# Patient Record
Sex: Female | Born: 1945 | Race: Black or African American | Hispanic: No | Smoking: Former smoker
Health system: Southern US, Community
[De-identification: ages and names within clinical notes are randomized; demographics above are authoritative.]

## PROBLEM LIST (undated history)

## (undated) DIAGNOSIS — R011 Cardiac murmur, unspecified: Secondary | ICD-10-CM

## (undated) DIAGNOSIS — I4819 Other persistent atrial fibrillation: Secondary | ICD-10-CM

## (undated) DIAGNOSIS — K219 Gastro-esophageal reflux disease without esophagitis: Secondary | ICD-10-CM

## (undated) DIAGNOSIS — K224 Dyskinesia of esophagus: Secondary | ICD-10-CM

## (undated) DIAGNOSIS — T4145XA Adverse effect of unspecified anesthetic, initial encounter: Secondary | ICD-10-CM

## (undated) DIAGNOSIS — I495 Sick sinus syndrome: Secondary | ICD-10-CM

## (undated) DIAGNOSIS — I739 Peripheral vascular disease, unspecified: Secondary | ICD-10-CM

## (undated) DIAGNOSIS — I10 Essential (primary) hypertension: Secondary | ICD-10-CM

## (undated) DIAGNOSIS — E119 Type 2 diabetes mellitus without complications: Secondary | ICD-10-CM

## (undated) DIAGNOSIS — Z789 Other specified health status: Secondary | ICD-10-CM

## (undated) DIAGNOSIS — M1990 Unspecified osteoarthritis, unspecified site: Secondary | ICD-10-CM

## (undated) DIAGNOSIS — I2510 Atherosclerotic heart disease of native coronary artery without angina pectoris: Secondary | ICD-10-CM

## (undated) DIAGNOSIS — K760 Fatty (change of) liver, not elsewhere classified: Secondary | ICD-10-CM

## (undated) DIAGNOSIS — T8859XA Other complications of anesthesia, initial encounter: Secondary | ICD-10-CM

## (undated) DIAGNOSIS — N189 Chronic kidney disease, unspecified: Secondary | ICD-10-CM

## (undated) DIAGNOSIS — G4730 Sleep apnea, unspecified: Secondary | ICD-10-CM

## (undated) DIAGNOSIS — Z66 Do not resuscitate: Secondary | ICD-10-CM

## (undated) DIAGNOSIS — Z794 Long term (current) use of insulin: Secondary | ICD-10-CM

## (undated) DIAGNOSIS — I4892 Unspecified atrial flutter: Secondary | ICD-10-CM

## (undated) DIAGNOSIS — M109 Gout, unspecified: Secondary | ICD-10-CM

## (undated) DIAGNOSIS — D179 Benign lipomatous neoplasm, unspecified: Secondary | ICD-10-CM

## (undated) DIAGNOSIS — I209 Angina pectoris, unspecified: Secondary | ICD-10-CM

## (undated) DIAGNOSIS — IMO0001 Reserved for inherently not codable concepts without codable children: Secondary | ICD-10-CM

## (undated) DIAGNOSIS — I219 Acute myocardial infarction, unspecified: Secondary | ICD-10-CM

## (undated) DIAGNOSIS — E785 Hyperlipidemia, unspecified: Secondary | ICD-10-CM

## (undated) HISTORY — DX: Do not resuscitate: Z66

## (undated) HISTORY — DX: Essential (primary) hypertension: I10

## (undated) HISTORY — DX: Fatty (change of) liver, not elsewhere classified: K76.0

## (undated) HISTORY — PX: TONSILLECTOMY: SUR1361

## (undated) HISTORY — DX: Sick sinus syndrome: I49.5

## (undated) HISTORY — DX: Morbid (severe) obesity due to excess calories: E66.01

## (undated) HISTORY — DX: Gout, unspecified: M10.9

## (undated) HISTORY — PX: BREAST LUMPECTOMY: SHX2

## (undated) HISTORY — PX: CORONARY ANGIOPLASTY WITH STENT PLACEMENT: SHX49

## (undated) HISTORY — DX: Hyperlipidemia, unspecified: E78.5

## (undated) HISTORY — DX: Atherosclerotic heart disease of native coronary artery without angina pectoris: I25.10

## (undated) HISTORY — DX: Sleep apnea, unspecified: G47.30

## (undated) HISTORY — DX: Dyskinesia of esophagus: K22.4

## (undated) HISTORY — DX: Acute myocardial infarction, unspecified: I21.9

## (undated) HISTORY — DX: Benign lipomatous neoplasm, unspecified: D17.9

## (undated) HISTORY — DX: Unspecified atrial flutter: I48.92

## (undated) HISTORY — DX: Other specified health status: Z78.9

## (undated) HISTORY — DX: Unspecified osteoarthritis, unspecified site: M19.90

## (undated) HISTORY — PX: TUBAL LIGATION: SHX77

---

## 1999-11-01 ENCOUNTER — Ambulatory Visit (HOSPITAL_COMMUNITY): Admission: RE | Admit: 1999-11-01 | Discharge: 1999-11-01 | Payer: Self-pay | Admitting: Internal Medicine

## 1999-11-01 ENCOUNTER — Encounter: Payer: Self-pay | Admitting: Internal Medicine

## 1999-11-29 ENCOUNTER — Inpatient Hospital Stay (HOSPITAL_COMMUNITY): Admission: EM | Admit: 1999-11-29 | Discharge: 1999-11-30 | Payer: Self-pay | Admitting: Emergency Medicine

## 1999-11-29 ENCOUNTER — Encounter: Payer: Self-pay | Admitting: Emergency Medicine

## 1999-11-29 ENCOUNTER — Encounter: Payer: Self-pay | Admitting: Internal Medicine

## 2000-07-07 ENCOUNTER — Encounter: Payer: Self-pay | Admitting: Emergency Medicine

## 2000-07-07 ENCOUNTER — Inpatient Hospital Stay (HOSPITAL_COMMUNITY): Admission: EM | Admit: 2000-07-07 | Discharge: 2000-07-10 | Payer: Self-pay | Admitting: Emergency Medicine

## 2000-09-23 ENCOUNTER — Ambulatory Visit (HOSPITAL_COMMUNITY): Admission: RE | Admit: 2000-09-23 | Discharge: 2000-09-23 | Payer: Self-pay | Admitting: Family Medicine

## 2000-09-23 ENCOUNTER — Encounter: Payer: Self-pay | Admitting: Family Medicine

## 2000-09-24 ENCOUNTER — Ambulatory Visit (HOSPITAL_COMMUNITY): Admission: RE | Admit: 2000-09-24 | Discharge: 2000-09-24 | Payer: Self-pay | Admitting: *Deleted

## 2000-11-02 ENCOUNTER — Ambulatory Visit (HOSPITAL_COMMUNITY): Admission: RE | Admit: 2000-11-02 | Discharge: 2000-11-02 | Payer: Self-pay | Admitting: Family Medicine

## 2000-11-06 ENCOUNTER — Encounter: Admission: RE | Admit: 2000-11-06 | Discharge: 2000-11-06 | Payer: Self-pay | Admitting: Family Medicine

## 2000-11-06 ENCOUNTER — Encounter: Payer: Self-pay | Admitting: Family Medicine

## 2001-09-16 ENCOUNTER — Emergency Department (HOSPITAL_COMMUNITY): Admission: EM | Admit: 2001-09-16 | Discharge: 2001-09-16 | Payer: Self-pay | Admitting: Emergency Medicine

## 2001-09-26 ENCOUNTER — Emergency Department (HOSPITAL_COMMUNITY): Admission: EM | Admit: 2001-09-26 | Discharge: 2001-09-26 | Payer: Self-pay

## 2002-10-29 ENCOUNTER — Ambulatory Visit (HOSPITAL_COMMUNITY): Admission: RE | Admit: 2002-10-29 | Discharge: 2002-10-29 | Payer: Self-pay | Admitting: Family Medicine

## 2002-10-29 ENCOUNTER — Encounter: Payer: Self-pay | Admitting: Family Medicine

## 2004-06-03 ENCOUNTER — Ambulatory Visit: Payer: Self-pay | Admitting: *Deleted

## 2004-06-06 ENCOUNTER — Ambulatory Visit: Payer: Self-pay | Admitting: Nurse Practitioner

## 2004-06-09 ENCOUNTER — Ambulatory Visit (HOSPITAL_COMMUNITY): Admission: RE | Admit: 2004-06-09 | Discharge: 2004-06-09 | Payer: Self-pay | Admitting: Family Medicine

## 2004-06-23 ENCOUNTER — Ambulatory Visit: Payer: Self-pay | Admitting: *Deleted

## 2004-10-18 ENCOUNTER — Ambulatory Visit: Payer: Self-pay | Admitting: Nurse Practitioner

## 2004-11-17 ENCOUNTER — Other Ambulatory Visit: Admission: RE | Admit: 2004-11-17 | Discharge: 2004-11-17 | Payer: Self-pay | Admitting: Family Medicine

## 2004-11-17 ENCOUNTER — Ambulatory Visit: Payer: Self-pay | Admitting: Nurse Practitioner

## 2005-03-20 ENCOUNTER — Ambulatory Visit: Payer: Self-pay | Admitting: Nurse Practitioner

## 2005-07-12 ENCOUNTER — Ambulatory Visit: Payer: Self-pay | Admitting: Nurse Practitioner

## 2005-08-07 ENCOUNTER — Ambulatory Visit: Admission: RE | Admit: 2005-08-07 | Discharge: 2005-08-07 | Payer: Self-pay | Admitting: Nurse Practitioner

## 2005-08-24 ENCOUNTER — Ambulatory Visit: Payer: Self-pay | Admitting: Nurse Practitioner

## 2005-09-14 ENCOUNTER — Ambulatory Visit: Payer: Self-pay | Admitting: Nurse Practitioner

## 2005-09-17 ENCOUNTER — Encounter: Admission: RE | Admit: 2005-09-17 | Discharge: 2005-09-17 | Payer: Self-pay | Admitting: Internal Medicine

## 2005-09-21 ENCOUNTER — Emergency Department (HOSPITAL_COMMUNITY): Admission: EM | Admit: 2005-09-21 | Discharge: 2005-09-21 | Payer: Self-pay | Admitting: Emergency Medicine

## 2005-09-27 ENCOUNTER — Ambulatory Visit: Payer: Self-pay | Admitting: Nurse Practitioner

## 2005-10-19 ENCOUNTER — Ambulatory Visit (HOSPITAL_BASED_OUTPATIENT_CLINIC_OR_DEPARTMENT_OTHER): Admission: RE | Admit: 2005-10-19 | Discharge: 2005-10-19 | Payer: Self-pay | Admitting: Internal Medicine

## 2005-10-22 ENCOUNTER — Ambulatory Visit: Payer: Self-pay | Admitting: Internal Medicine

## 2005-11-20 ENCOUNTER — Encounter: Admission: RE | Admit: 2005-11-20 | Discharge: 2005-11-20 | Payer: Self-pay | Admitting: Nephrology

## 2005-11-21 ENCOUNTER — Encounter: Admission: RE | Admit: 2005-11-21 | Discharge: 2005-12-19 | Payer: Self-pay | Admitting: Family Medicine

## 2006-08-30 ENCOUNTER — Ambulatory Visit (HOSPITAL_COMMUNITY): Admission: RE | Admit: 2006-08-30 | Discharge: 2006-08-30 | Payer: Self-pay | Admitting: Interventional Cardiology

## 2006-09-04 DIAGNOSIS — K224 Dyskinesia of esophagus: Secondary | ICD-10-CM

## 2006-09-04 DIAGNOSIS — K760 Fatty (change of) liver, not elsewhere classified: Secondary | ICD-10-CM

## 2006-09-04 HISTORY — DX: Dyskinesia of esophagus: K22.4

## 2006-09-04 HISTORY — PX: ATRIAL FLUTTER ABLATION: SHX5733

## 2006-09-04 HISTORY — DX: Fatty (change of) liver, not elsewhere classified: K76.0

## 2006-12-03 ENCOUNTER — Ambulatory Visit (HOSPITAL_COMMUNITY): Admission: RE | Admit: 2006-12-03 | Discharge: 2006-12-03 | Payer: Self-pay | Admitting: Gastroenterology

## 2006-12-19 ENCOUNTER — Ambulatory Visit (HOSPITAL_COMMUNITY): Admission: RE | Admit: 2006-12-19 | Discharge: 2006-12-19 | Payer: Self-pay | Admitting: Gastroenterology

## 2007-04-05 DIAGNOSIS — N189 Chronic kidney disease, unspecified: Secondary | ICD-10-CM

## 2007-04-05 HISTORY — DX: Chronic kidney disease, unspecified: N18.9

## 2007-04-09 ENCOUNTER — Ambulatory Visit (HOSPITAL_COMMUNITY): Admission: RE | Admit: 2007-04-09 | Discharge: 2007-04-09 | Payer: Self-pay | Admitting: Family Medicine

## 2007-04-15 ENCOUNTER — Ambulatory Visit: Payer: Self-pay | Admitting: Internal Medicine

## 2007-04-15 ENCOUNTER — Inpatient Hospital Stay (HOSPITAL_COMMUNITY): Admission: EM | Admit: 2007-04-15 | Discharge: 2007-04-22 | Payer: Self-pay | Admitting: Emergency Medicine

## 2007-05-24 ENCOUNTER — Inpatient Hospital Stay (HOSPITAL_COMMUNITY): Admission: EM | Admit: 2007-05-24 | Discharge: 2007-05-28 | Payer: Self-pay | Admitting: Emergency Medicine

## 2007-05-27 ENCOUNTER — Encounter (INDEPENDENT_AMBULATORY_CARE_PROVIDER_SITE_OTHER): Payer: Self-pay | Admitting: Interventional Cardiology

## 2007-05-31 ENCOUNTER — Ambulatory Visit: Payer: Self-pay | Admitting: Internal Medicine

## 2007-09-05 DIAGNOSIS — I219 Acute myocardial infarction, unspecified: Secondary | ICD-10-CM

## 2007-09-05 HISTORY — DX: Acute myocardial infarction, unspecified: I21.9

## 2008-02-18 ENCOUNTER — Encounter: Admission: RE | Admit: 2008-02-18 | Discharge: 2008-02-18 | Payer: Self-pay | Admitting: Orthopedic Surgery

## 2008-02-19 ENCOUNTER — Ambulatory Visit (HOSPITAL_BASED_OUTPATIENT_CLINIC_OR_DEPARTMENT_OTHER): Admission: RE | Admit: 2008-02-19 | Discharge: 2008-02-19 | Payer: Self-pay | Admitting: Orthopedic Surgery

## 2008-04-30 ENCOUNTER — Ambulatory Visit (HOSPITAL_COMMUNITY): Admission: RE | Admit: 2008-04-30 | Discharge: 2008-04-30 | Payer: Self-pay | Admitting: Family Medicine

## 2008-05-31 ENCOUNTER — Inpatient Hospital Stay (HOSPITAL_COMMUNITY): Admission: EM | Admit: 2008-05-31 | Discharge: 2008-06-03 | Payer: Self-pay | Admitting: Family Medicine

## 2008-07-06 ENCOUNTER — Other Ambulatory Visit: Admission: RE | Admit: 2008-07-06 | Discharge: 2008-07-06 | Payer: Self-pay | Admitting: Internal Medicine

## 2008-07-15 ENCOUNTER — Encounter: Admission: RE | Admit: 2008-07-15 | Discharge: 2008-07-15 | Payer: Self-pay | Admitting: Internal Medicine

## 2008-12-18 ENCOUNTER — Inpatient Hospital Stay (HOSPITAL_COMMUNITY): Admission: AD | Admit: 2008-12-18 | Discharge: 2008-12-21 | Payer: Self-pay | Admitting: Interventional Cardiology

## 2009-02-19 ENCOUNTER — Encounter: Admission: RE | Admit: 2009-02-19 | Discharge: 2009-02-19 | Payer: Self-pay | Admitting: *Deleted

## 2010-09-04 DIAGNOSIS — K219 Gastro-esophageal reflux disease without esophagitis: Secondary | ICD-10-CM

## 2010-09-04 HISTORY — DX: Gastro-esophageal reflux disease without esophagitis: K21.9

## 2010-09-25 ENCOUNTER — Encounter: Payer: Self-pay | Admitting: Internal Medicine

## 2010-12-14 LAB — GLUCOSE, CAPILLARY
Glucose-Capillary: 100 mg/dL — ABNORMAL HIGH (ref 70–99)
Glucose-Capillary: 209 mg/dL — ABNORMAL HIGH (ref 70–99)
Glucose-Capillary: 228 mg/dL — ABNORMAL HIGH (ref 70–99)
Glucose-Capillary: 230 mg/dL — ABNORMAL HIGH (ref 70–99)
Glucose-Capillary: 232 mg/dL — ABNORMAL HIGH (ref 70–99)
Glucose-Capillary: 235 mg/dL — ABNORMAL HIGH (ref 70–99)
Glucose-Capillary: 238 mg/dL — ABNORMAL HIGH (ref 70–99)
Glucose-Capillary: 254 mg/dL — ABNORMAL HIGH (ref 70–99)
Glucose-Capillary: 263 mg/dL — ABNORMAL HIGH (ref 70–99)
Glucose-Capillary: 273 mg/dL — ABNORMAL HIGH (ref 70–99)
Glucose-Capillary: 304 mg/dL — ABNORMAL HIGH (ref 70–99)
Glucose-Capillary: 311 mg/dL — ABNORMAL HIGH (ref 70–99)

## 2010-12-14 LAB — COMPREHENSIVE METABOLIC PANEL
ALT: 17 U/L (ref 0–35)
AST: 22 U/L (ref 0–37)
Albumin: 4.4 g/dL (ref 3.5–5.2)
Alkaline Phosphatase: 84 U/L (ref 39–117)
BUN: 45 mg/dL — ABNORMAL HIGH (ref 6–23)
CO2: 21 mEq/L (ref 19–32)
Calcium: 9.7 mg/dL (ref 8.4–10.5)
Chloride: 100 mEq/L (ref 96–112)
Creatinine, Ser: 3.28 mg/dL — ABNORMAL HIGH (ref 0.4–1.2)
GFR calc Af Amer: 17 mL/min — ABNORMAL LOW (ref >60)
GFR calc non Af Amer: 14 mL/min — ABNORMAL LOW (ref >60)
Glucose, Bld: 236 mg/dL — ABNORMAL HIGH (ref 70–99)
Potassium: 6.9 mEq/L (ref 3.5–5.1)
Sodium: 132 mEq/L — ABNORMAL LOW (ref 135–145)
Total Bilirubin: 0.5 mg/dL (ref 0.3–1.2)
Total Protein: 8.1 g/dL (ref 6.0–8.3)

## 2010-12-14 LAB — CBC
HCT: 42.5 % (ref 36.0–46.0)
HCT: 48 % — ABNORMAL HIGH (ref 36.0–46.0)
Hemoglobin: 14.5 g/dL (ref 12.0–15.0)
Hemoglobin: 16.4 g/dL — ABNORMAL HIGH (ref 12.0–15.0)
MCHC: 34.2 g/dL (ref 30.0–36.0)
MCHC: 34.2 g/dL (ref 30.0–36.0)
MCV: 97 fL (ref 78.0–100.0)
MCV: 97.2 fL (ref 78.0–100.0)
Platelets: 190 10*3/uL (ref 150–400)
Platelets: 215 10*3/uL (ref 150–400)
RBC: 4.37 MIL/uL (ref 3.87–5.11)
RBC: 4.95 MIL/uL (ref 3.87–5.11)
RDW: 15.3 % (ref 11.5–15.5)
RDW: 15.4 % (ref 11.5–15.5)
WBC: 6.4 10*3/uL (ref 4.0–10.5)
WBC: 6.6 10*3/uL (ref 4.0–10.5)

## 2010-12-14 LAB — BASIC METABOLIC PANEL
BUN: 42 mg/dL — ABNORMAL HIGH (ref 6–23)
BUN: 43 mg/dL — ABNORMAL HIGH (ref 6–23)
BUN: 46 mg/dL — ABNORMAL HIGH (ref 6–23)
CO2: 24 mEq/L (ref 19–32)
CO2: 24 mEq/L (ref 19–32)
CO2: 26 mEq/L (ref 19–32)
Calcium: 9.2 mg/dL (ref 8.4–10.5)
Calcium: 9.4 mg/dL (ref 8.4–10.5)
Calcium: 9.7 mg/dL (ref 8.4–10.5)
Chloride: 101 mEq/L (ref 96–112)
Chloride: 101 mEq/L (ref 96–112)
Chloride: 99 mEq/L (ref 96–112)
Creatinine, Ser: 2.75 mg/dL — ABNORMAL HIGH (ref 0.4–1.2)
Creatinine, Ser: 2.86 mg/dL — ABNORMAL HIGH (ref 0.4–1.2)
Creatinine, Ser: 2.98 mg/dL — ABNORMAL HIGH (ref 0.4–1.2)
GFR calc Af Amer: 19 mL/min — ABNORMAL LOW (ref >60)
GFR calc Af Amer: 20 mL/min — ABNORMAL LOW (ref >60)
GFR calc Af Amer: 21 mL/min — ABNORMAL LOW (ref >60)
GFR calc non Af Amer: 16 mL/min — ABNORMAL LOW (ref >60)
GFR calc non Af Amer: 17 mL/min — ABNORMAL LOW (ref >60)
GFR calc non Af Amer: 17 mL/min — ABNORMAL LOW (ref >60)
Glucose, Bld: 188 mg/dL — ABNORMAL HIGH (ref 70–99)
Glucose, Bld: 229 mg/dL — ABNORMAL HIGH (ref 70–99)
Glucose, Bld: 243 mg/dL — ABNORMAL HIGH (ref 70–99)
Potassium: 4 mEq/L (ref 3.5–5.1)
Potassium: 4.1 mEq/L (ref 3.5–5.1)
Potassium: 4.6 mEq/L (ref 3.5–5.1)
Sodium: 134 mEq/L — ABNORMAL LOW (ref 135–145)
Sodium: 135 mEq/L (ref 135–145)
Sodium: 137 mEq/L (ref 135–145)

## 2010-12-14 LAB — PROTIME-INR
INR: 1 (ref 0.00–1.49)
Prothrombin Time: 13.1 seconds (ref 11.6–15.2)

## 2010-12-14 LAB — TSH: TSH: 2.834 u[IU]/mL (ref 0.350–4.500)

## 2010-12-14 LAB — MAGNESIUM: Magnesium: 2.9 mg/dL — ABNORMAL HIGH (ref 1.5–2.5)

## 2010-12-14 LAB — CK TOTAL AND CKMB (NOT AT ARMC)
CK, MB: 3.3 ng/mL (ref 0.3–4.0)
Relative Index: 0.8 (ref 0.0–2.5)
Total CK: 413 U/L — ABNORMAL HIGH (ref 7–177)

## 2010-12-14 LAB — BRAIN NATRIURETIC PEPTIDE: Pro B Natriuretic peptide (BNP): 215 pg/mL — ABNORMAL HIGH (ref 0.0–100.0)

## 2010-12-14 LAB — APTT: aPTT: 26 seconds (ref 24–37)

## 2011-01-10 ENCOUNTER — Other Ambulatory Visit (HOSPITAL_COMMUNITY): Payer: Self-pay | Admitting: Anesthesiology

## 2011-01-10 DIAGNOSIS — Z1231 Encounter for screening mammogram for malignant neoplasm of breast: Secondary | ICD-10-CM

## 2011-01-17 NOTE — Consult Note (Signed)
[name], [name]                ACCOUNT NO.:  [id number]   MEDICAL RECORD NO.:  [id number]          PATIENT TYPE:  INP   LOCATION:  2917                         FACILITY:  [facility]   PHYSICIAN:  Champ Mungo. Lovena Le, MD    DATE OF BIRTH:  [birth year]   DATE OF CONSULTATION:  04/16/2007  DATE OF DISCHARGE:                                 CONSULTATION   INDICATION FOR CONSULTATION:  Recommendations for treatment of atrial  flutter alone.   HISTORY OF PRESENT ILLNESS:  The patient is a 65 year old woman history  of coronary disease with occluded right coronary artery and preserved LV  function, hypertension, diabetes and dyslipidemia.  She was admitted to  hospital after having a syncopal episode and was subsequently found to  be in atrial flutter with a rapid ventricular response at a rate of 150  beats per minute.  This was typical in atrial flutter.  She was placed  on AV nodal blocking drugs which had very little effect on her rate  control.  This included diltiazem, metoprolol and digoxin.  She is  referred now for additional evaluation and treatment.  The patient  denies chest pain.  She has a past medical history notable for diabetes,  hypertension, dyslipidemia and morbid obesity.   SOCIAL HISTORY:  The patient lives alone in [place].  She denies  tobacco or ethanol abuse.  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   FAMILY HISTORY:  Notable for father who died of unknown causes, a mother  57 with coronary disease.   REVIEW OF SYSTEMS:  Notable for recent episodes of sweating and  diaphoresis for the last several days.  She also has cramps in her legs  when she walks.  She has gastroesophageal reflux symptoms.  Otherwise,  all systems reviewed and negative except as noted in the HPI.   PHYSICAL EXAMINATION:  GENERAL:  She is a pleasant obese middle-aged  woman in no distress.  VITAL SIGNS:  Blood pressure is 133/71, pulse was 140 and regular,  respirations were 18, temperature  98.  HEENT:  Normocephalic, atraumatic.  Pupils equal and round.  Oropharynx  moist.  Sclerae anicteric.  NECK:  Revealed no jugular distension.  There are no thyromegaly.  Trachea is midline.  Carotid 2+ symmetric.  LUNGS:  Clear bilaterally auscultation.  No wheezes, rales or rhonchi  are present.  There is no increased work of breathing.  CARDIAC:  Regular rhythm with normal S1-S2 there was rubs, gallops.  Cardiac exam demonstrated a regular tachycardia.  The PMI was not  enlarged nor laterally displaced.  ABDOMEN:  Soft, nontender, nondistended.  No organomegaly present.  Bowel sounds are present.  No rebound or guarding.  EXTREMITIES:  Demonstrate no cyanosis, or edema.  Pulses 2+ and  symmetric.  NEUROLOGIC:  Alert and oriented x3, cranial nerves intact.  Strength is  5/5 and symmetric.   STUDIES:  EKG demonstrates atrial flutter with a rapid ventricular  response.   LABORATORY DATA:  Labs of note include creatinine 2.2, normal TSH, other  labs unremarkable except for borderline troponin.   IMPRESSION:  1. Symptomatic atrial flutter.  2. Syncope secondary to #1.  3. Chronic coronary disease.  4. Hypertension.  5. Dyslipidemia.  6. Renal sufficiency.  7. History of asthma.   DISCUSSION:  I have recommended that we proceed with electrophysiologic  study and catheter ablation to get rid of atrial flutter and follow this  with 3-4 weeks of anticoagulation with heparin and Coumadin.  The risks,  benefits, goals and expectations of procedure have been discussed with  the patient.  I would recommend TE prior to this so we can rule out  thromboembolic chances.      Champ Mungo. Lovena Le, MD  Electronically Signed     GWT/MEDQ  D:  04/16/2007  T:  04/17/2007  Job:  [id number]   cc:   Jettie Booze, MD  [facility] Dema Severin, M.D.

## 2011-01-17 NOTE — H&P (Signed)
NAMEKENNYA, OJO                ACCOUNT NO.:  [id number]   MEDICAL RECORD NO.:  [id number]          PATIENT TYPE:  INP   LOCATION:  [id number]                         FACILITY:  [facility]   PHYSICIAN:  Jettie Booze, MDDATE OF BIRTH:  1946/01/05   DATE OF ADMISSION:  04/15/2007  DATE OF DISCHARGE:                              HISTORY & PHYSICAL   CHIEF COMPLAINT:  Syncope.   HISTORY OF PRESENT ILLNESS:  [name] is a 65 year old female who  presented to the office today in atrial flutter with a 2:1 conduction.  She had some intermittent chest discomfort yesterday with left arm  radiation.  She had some palpitations as well.  She went to bed last  night and sometime during the night woke up to urinate.  She went to the  bathroom and woke up around 1:30 a.m., although she does not remember  what time she actually went to the bathroom.  She has no idea of how  long she was out.  She says she was kind of confused afterwards;  however, she denies any tongue biting or urinary incontinence.   PAST MEDICAL HISTORY:  Significant for:  1. Hypertension.  2. She has had a cardiac catheterization, which showed a total right      coronary artery with left to right collaterals.  Last documented      ejection fraction, around 2001, was 47%.  3. Hypertensive heart disease.  4. Seasonal allergies.  5. Asthma.  6. Diabetes mellitus.  7. Hyperlipidemia.   ALLERGIES:  1. CODEINE.  2. PENICILLIN.  3. SULFA.   MEDICATIONS:  1. Diltiazem 180 mg p.o. b.i.d.  2. Fosamax 70 mg weekly.  3. Ambien 10 mg q.h.s.  4. Poti8um 20 mEq t.i.d.  5. Lasix 80 mg p.o. b.i.d.  6. Digoxin 0.125 mg daily.  7. Amitriptyline 25 mg daily.  8. Tekturna 300 mg daily.  9. Advair 250 one inhalation twice a day.  10.Lantus 30 units in the morning, 80 units in the evening.  11.Humalog 45 units daily.  12.Pravastatin 40 mg daily.   SOCIAL HISTORY:  No tobacco, alcohol or illicit drug use.   FAMILY HISTORY:  Mom had  a history of coronary artery disease.   ROS:  Mild chest pain and shortness of breath.  Syncope as described  above.  No nausea or vomiting.  No weight loss.  No focal weakness.  All  other systems negative.   PHYSICAL EXAMINATION:  VITAL SIGNS:  Heart rate is 130.  Blood pressure  130/74.  Respirations 20.  HEENT:  Grossly normal.  No carotid or subclavian bruits or JVD or  thyromegaly.  Sclerae are clear.  Conjunctivae normal.  Nares without  drainage.  CHEST:  Clear to auscultation bilaterally.  No wheezing or rhonchi.  HEART:  Regular rate and rhythm.  No murmurs.  ABDOMEN:  Benign.  Obese.  Good bowel sounds.  LOWER EXTREMITIES:  No peripheral edema.  SKIN:  Warm and dry.   ASSESSMENT/PLAN:  1. Atrial flutter.  I will try to give her Cardizem 10 mg IV bolus  with no avail.  Now we are giving her Lopressor 5 mg IV x1, heart      rate is now below 100.  She seems to be doing well.  We will start      on a beta-blocker and continue her current medication therapy and      consult the electrophysiology for risk for possible atrial flutter      ablation.  2. Coronary artery disease.  3. Hypertensive heart disease.  4. Diabetes mellitus.  5. Dyslipidemia.  6. Seasonal allergies.  7. Asthma.  8. Check electrolytes.  9. Check thyroid studies.  10.Continue same medications as listed above.      Joesphine Bare, P.A.      Jettie Booze, MD  Electronically Signed    LB/MEDQ  D:  04/15/2007  T:  04/15/2007  Job:  [id number]

## 2011-01-17 NOTE — Discharge Summary (Signed)
NAMEHEIDY, HENEGHAN                ACCOUNT NO.:  [id number]   MEDICAL RECORD NO.:  [id number]          PATIENT TYPE:  INP   LOCATION:  4729                         FACILITY:  [facility]   PHYSICIAN:  [place] OF BIRTH:  09/16/1945   DATE OF ADMISSION:  12/18/2008  DATE OF DISCHARGE:  12/21/2008                               DISCHARGE SUMMARY   DISCHARGE DIAGNOSES:  1. Junctional bradycardia, resolved, now in normal sinus rhythm.  2. Fatigue.  3. Diabetes mellitus.  4. History of congestive heart failure.  5. Mixed hyperlipidemia.  6. History of atrial fibrillation.  7. Gastroesophageal reflux disease.  8. Hiatal hernia.  9. Asthma.  10.History of rheumatic fever.  11.Osteoarthritis.  12.Mild sleep apnea.  13.Coronary artery disease status post stent, September 2009.   HOSPITAL COURSE:  [name] is a 65 year old female who came into the  office on December 18, 2008, complaining of not feeling well over the past  3 weeks.  She had some dizziness and nausea and palpitations.  She has  had to prop up on pillows at night to breath.  She has had  lightheadedness but no syncope.   In the office, the EKG showed a junctional rhythm, which is new for her.  She has had a history of AFib, and I believe by memory that she may have  had an ablation at some point.   She was promptly admitted to [facility] where laboratory studies  showed a potassium of 6.9.  She was given Kayexalate for this, and by  discharge her potassium was 4.1.  She was on the dry side, and her BUN  was 45 with a creatinine of 3.28 and this has gone down to 42/2.86 by  discharge.  We had to rearrange her medication scheduling, but by the  time she was discharged, her systolic blood pressure was around 100, her  potassium was back to normal, she was feeling much better.  Her rhythm  had reverted back into normal sinus rhythm.   Other lab studies showed a hemoglobin of 14.5, hematocrit of 42.9, white  count of 6.6, platelets 190.  LFTs were normal.  TSH 2.834.   DISCHARGE MEDICATIONS:  1. Baby aspirin 81 mg a day.  2. Nexium 40 mg a day/omeprazole daily.  3. Clonidine 0.3 mg twice a day.  4. Lasix 80 mg 1 tablet twice a day.  This is a new dose.  She used to      be on 160 mg twice a day.  5. She is to continue Lantus insulin as before.  6. Amitriptyline 25 mg at bedtime.  7. Advair as before.  8. Plavix 75 mg a day.  9. Amlodipine 5 mg a day.  This is a new medication for her blood      pressure since we have stopped her diltiazem.  10.Eye drops as before.  11.Coreg 12.5 mg one-half tablet p.o. b.i.d. (new dose).  12.Potassium 20 mEq a day (new dose).  13.Ambien at bedtime p.r.n.  14.Pravachol 80 mg a day.  15.Colchicine 0.6 mg daily as needed.  16.Albuterol  as needed.   She is to stop taking diltiazem and spironolactone.   She is to remain on a low-sodium, heart-healthy diet.  Increase activity  slowly.  Go to the lab on December 24, 2008, at 10:00 a.m. for a stat BMET  and follow up with Virgilio Belling, NP, at 10:30 the same day.  At this  point, we need to make sure her kidney function is back to baseline  (chronic kidney disease) and that her potassium is normal.  She will  also have an EKG done to assure that she has not gone back into a  junctional rhythm.  Please note, we have stopped her rate controlling  medications, diltiazem.  She still remains on Coreg.      Joesphine Bare, P.A.      Jettie Booze, MD  Electronically Signed    LB/MEDQ  D:  12/21/2008  T:  12/22/2008  Job:  [id number]   cc:   Darrold Span. Florene Glen, M.D.  Donald Prose, M.D.

## 2011-01-17 NOTE — H&P (Signed)
[name], [name]                ACCOUNT NO.:  [id number]   MEDICAL RECORD NO.:  [id number]          PATIENT TYPE:  INP   LOCATION:  [id number]                         FACILITY:  [facility]   PHYSICIAN:  Jettie Booze, MDDATE OF BIRTH:  Oct 09, 1945   DATE OF ADMISSION:  05/24/2007  DATE OF DISCHARGE:                              HISTORY & PHYSICAL   REFERRING PHYSICIAN:  Dr. Harlan Stains.   CHIEF COMPLAINT:  Shortness of breath and chest tightness.   HISTORY OF PRESENT ILLNESS:  The patient is a 65 year old woman who  presented to our office today after feeling very short of breath.  The  shortness of breath came on yesterday.  She also felt a tightness in her  chest at that time.  She tried taking her inhaler but there was no  relief.  She continued to feel short of breath and called our office  this morning.  The chest tightness has resolved but she still feels  short of breath even at rest.  It is significantly worse when she tries  to walk.  She did not take any of her medicines today.  She denies any  nausea or vomiting.  She does not have any fevers or chills.   PAST MEDICAL HISTORY:  1. Hypertension.  2. Coronary artery disease.  3. Hypertensive heart disease.  4. Seasonal allergies.  5. Asthma.  6. Diabetes.  7. Hyperlipidemia.   ALLERGIES:  1. CODEINE.  2. PENICILLIN.  3. SULFA.   MEDICATIONS:  1. Diltiazem 180 mg p.o. b.i.d.  2. Fosamax 70 mg weekly.  3. Ambien 10 mg nightly.  4. Potassium 40 mEq t.i.d.  5. Lasix 80 mg b.i.d.  6. Digoxin 0.125 mg daily.  7. Amitriptyline 25 mg a day.  8. Tekturna 300 mg a day.  9. Advair 250 mg b.i.d.  10.Lantus 50 units the morning and 80 units in evening.  11.Humalog 45 mg a day.  12.Albuterol p.r.n.  13.Pravastatin 40 mg.  14.Coumadin.   SOCIAL HISTORY:  She does not smoke.  She does not drink.  She does not  use any illegal drugs.   FAMILY HISTORY:  Mother had coronary artery disease.   REVIEW OF SYSTEMS:   Significant for tightness in her chest as well as  shortness of breath.  No recent fever, chills.  Shortness of breath and  chest tightness as noted above.  No edema, no nausea or vomiting.  No  focal weakness, no bleeding problems.  All other systems negative.   PHYSICAL EXAMINATION:  VITAL SIGNS:  Blood pressure 180/90, pulse of 70.  GENERAL APPEARANCE:  She is awake and in mild respiratory distress.  HEENT:  Head:  Normocephalic, atraumatic.  Eyes:  Extraocular movements  intact.  NECK:  No JVD.  CARDIOVASCULAR:  Regular rate and rhythm.  S1-S2.  LUNGS:  Decreased breath sounds at both bases.  Mild wheezing.  ABDOMEN:  Soft, nontender and nondistended.  EXTREMITIES:  No edema.  NEUROLOGIC:  No focal deficits.  SKIN:  No rash.  BACK:  No kyphosis.   ECG shows normal sinus rhythm with increased  voltage and ST-segment  changes associated with LVH.   MEDICAL DECISION-MAKING:  A 65 year old woman with known coronary artery  disease and recent atrial flutter along with high blood pressure and  diabetes who is having an acute onset of shortness of breath.   PLAN:  1. Will admit.  Check lab work including BNP and cardiac enzymes.  2. Will place her on telemetry.  3. Check a chest x-ray.  4. Depending on her enzymes, she may need further cardiac workup      including either an echo or perhaps a cath.  If this is all      pulmonary, we will plan on treating her with steroids and      antibiotics.  5. I will follow the patient in the hospital.      Jettie Booze, MD  Electronically Signed     JSV/MEDQ  D:  05/24/2007  T:  05/24/2007  Job:  [id number]   cc:   [facility]. Dema Severin, M.D.  Champ Mungo. Lovena Le, MD

## 2011-01-17 NOTE — Op Note (Signed)
NAMEEMELIN, ADOLPHE                ACCOUNT NO.:  [id number]   MEDICAL RECORD NO.:  [id number]          PATIENT TYPE:  AMB   LOCATION:  DSC                          FACILITY:  [facility]   PHYSICIAN:  Alta Corning, M.D.   DATE OF BIRTH:  [birth year]   DATE OF PROCEDURE:  02/19/2008  DATE OF DISCHARGE:                               OPERATIVE REPORT   PREOPERATIVE DIAGNOSIS:  Lateral meniscal tear.   POSTOPERATIVE DIAGNOSES:  1. Lateral meniscal tear.  2. Medial meniscal tear.  3. Chondromalacia patella.   OPERATIONS AND PROCEDURES:  1. Partial lateral meniscectomy with corresponding debridement of the      lateral compartment.  2. Partial medial meniscectomy with corresponding debridement of the      medial femoral compartment.  3. Debridement of chondromalacia patella and patellofemoral trochlea.   SURGEON:  Alta Corning, MD.   ASSISTANT:  Gary Fleet, PA.   ANESTHESIA:  General.   BRIEF HISTORY:  Mrs. Kriel is a 65 year old female with a long history  of having had significant pain in the right knee.  We treated her  conservatively for a period of time.  Because of failure of conservative  care, she was taken to the operating room for operative knee  arthroscopy.   PROCEDURE:  The patient was taken to operating room.  After adequate  anesthesia was obtained with a general anesthetic, the patient was  placed on the operating room table.  The right leg was prepped and  draped in the usual sterile fashion.  Following this, routine  arthroscopic examination of the knee revealed there was an obvious  chondromalacia in the medial tibial plateau, which was debrided.  There  was a small far medial meniscal tear, which was debrided with a straight-  biting forceps.  Meniscal rim was cutdown with a suction shaver.  Attention was turned to the ACL, which was normal.  Attention was turned  to the lateral compartment, which showed a complex tear of the lateral  meniscus with flaps  anteriorly and posteriorly.  These were debrided.  Following this, they were reamed.  The meniscal rim remaining contoured  down with a suction shaver.  There was some grade 2 and grade 3 change  on the lateral femoral condyle, which was debrided.  Attention was  turned up to the patellofemoral joint, which had some grade 2 and grade  3 changes on both the patellar side and the trochlear side.  These were  debrided.  Once this  was completed, the leg with scoped and thoroughly irrigated and  suctioned dry.  All sterile portals were closed with bandages and  sterile compression dressing was applied.  The patient was taken to the  recovery room.  She was noted to be in significant satisfactory  condition.  Estimated blood loss during the procedure was none.      Alta Corning, M.D.  Electronically Signed     JLG/MEDQ  D:  02/19/2008  T:  02/20/2008  Job:  [id number]

## 2011-01-17 NOTE — Op Note (Signed)
[name], [name]                ACCOUNT NO.:  [id number]   MEDICAL RECORD NO.:  [id number]          PATIENT TYPE:  INP   LOCATION:  2917                         FACILITY:  [facility]   PHYSICIAN:  Champ Mungo. Lovena Le, MD    DATE OF BIRTH:  [birth year]   DATE OF PROCEDURE:  04/16/2007  DATE OF DISCHARGE:                               OPERATIVE REPORT   PROCEDURE PERFORMED:  Electrophysiologic study and RF catheter ablation  of atrial flutter.   INTRODUCTION:  The patient is a very pleasant 65 year old woman with a  history of hypertension, dyslipidemia, and coronary disease, who is  admitted to the hospital with shortness of breath and a syncopal  episode.  She was subsequently found to be in atrial flutter and has  been refractory to AV nodal blocking drugs and is now referred for  electrophysiologic study and possible catheter ablation.  Of note, the  patient underwent transesophageal echo which demonstrated no left atrial  appendage thrombus.   PROCEDURE:  After informed consent was obtained, the patient was taken  to the diagnostic EP lab in a fasting state.  After the usual  preparation and draping, intravenous fentanyl and midazolam were given  for sedation.  A 6-French hexapolar catheter was inserted percutaneously  in the right jugular vein and advanced in the coronary sinus.  A 5-  French quadripolar catheter was inserted percutaneously in the right  femoral vein and advanced to the His bundle region.  A 7-French 20 halo  catheter was inserted percutaneously in the right femoral vein and  advanced to the right atrium.  Mapping was subsequently carried out  demonstrating typical counter clockwise tricuspid annular reentrant  atrial flutter.  Cycle length was 225 milliseconds.  A 7-French  quadripolar ablation catheter was then maneuvered into the usual atrial  flutter isthmus and mapping was carried out.  This demonstrated a fairly  small atrial flutter isthmus with normal  orientation.  Seven RF energy  applications were subsequently delivered including four bonus RF energy  applications resulting in the termination of atrial flutter, restoration  of sinus rhythm, and creation of bidirectional block and atrial flutter  isthmus.  Following this, rapid ventricular pacing was carried out from  the RV apex demonstrating VA dissociation at 600 milliseconds.  Next,  programmed ventricular stimulation was carried out from the RV apex,  again, demonstrating VA dissociation at 600 milliseconds.  Next,  programmed atrial stimulation was carried out in the coronary sinus at a  base drive cycle length of 500 milliseconds stepwise decreased down to  20 milliseconds where the AV node ERP was observed.  During probing  atrial stimulation, there were no AH jumps, no echo beats, and no  inducible SVT.  Next, rapid atrial pacing was carried out from the  coronary sinus and stepwise decreased down to 390 milliseconds where AV  Wenckebach was observed.  During rapid atrial pacing, the PR interval  was less than the RR interval and there was no inducible SVT.  At this  point, the patient was observed for approximately 20 more minutes and  had no  recurrent atrial flutter isthmus conduction.  The catheter was  then removed.  Hemostasis was assured. The patient was returned to her  room in satisfactory condition.   COMPLICATIONS:  There were no immediate procedure complications.   RESULTS:  A. Baseline ECG.  Baseline ECG demonstrates atrial flutter  with 2:1 AV conduction.  B.  Baseline intervals. The HV interval was 35 milliseconds.  The atrial  flutter cycle length was 225 milliseconds.  The QRS duration was 100  milliseconds.  C.  Rapid ventricular pacing.  Rapid ventricular pacing demonstrated VA  dissociation at 600 milliseconds.  D.  Programmed ventricular stimulation.  Programmed ventricular  stimulation was carried out from the RV apex demonstrating VA  dissociation at  600 milliseconds.  E.  Rapid atrial pacing.  Rapid atrial pacing was carried out from the  coronary sinus and high right atrium at a base drive cycle length of 600  milliseconds and stepwise decreased down to 340 milliseconds where AV  Wenckebach was observed.  During rapid atrial pacing, the PR interval  was less than the RR interval and there was no inducible SVT.  F.  Programmed atrial stimulation.  Programmed atrial stimulation was  carried out from the coronary sinus and high right atrium at a base  drive cycle length of 500 milliseconds.  The S1-S2 interval was stepwise  decreased from 440 milliseconds down to 320 milliseconds where AV node  ERP was observed.  During programmed atrial stimulation, there were no  AH jumps and no echo beats noted.  G.  Arrhythmias observed.  1. Atrial flutter. Initiation present at the time of EP study,      duration was sustained, termination was with catheter ablation,      cycle length 225 milliseconds.      a.     Mapping.  Mapping of atrial flutter isthmus demonstrated a       smaller than usual atrial flutter isthmus and normal orientation.      b.     RF energy application. A total of 7 RF energy applications       were delivered.  During the second RF energy application, atrial       flutter was terminated and sinus rhythm restored.  During the       fourth RF energy application, atrial flutter isthmus block was       demonstrated.  Three bonus RF energy applications were then       delivered.   CONCLUSION:  This study demonstrates successful electrophysiologic study  and RF catheter ablation of typical atrial flutter with a total of 7 RF  energy applications delivered to the usual atrial flutter isthmus  resulting in termination of atrial flutter, restoration sinus rhythm,  and creation of bidirectional block and atrial flutter isthmus.      Champ Mungo. Lovena Le, MD  Electronically Signed     GWT/MEDQ  D:  04/16/2007  T:  04/17/2007   Job:  [id number]   cc:   Jettie Booze, MD  [facility] Dema Severin, M.D.

## 2011-01-17 NOTE — Discharge Summary (Signed)
[name], PEACE                ACCOUNT NO.:  [id number]   MEDICAL RECORD NO.:  [id number]           PATIENT TYPE:   LOCATION:                                 FACILITY:   PHYSICIAN:  Belva Crome, M.D.   DATE OF BIRTH:  Feb 27, 1946   DATE OF ADMISSION:  05/31/2008  DATE OF DISCHARGE:  06/03/2008                               DISCHARGE SUMMARY   DISCHARGE DIAGNOSES:  1. Coronary artery disease status post drug-eluting stent to the      circumflex, minimal troponin elevation consistent with non-ST-      segment elevated myocardial infarction.  2. Known coronary artery disease, history of totaled right coronary      artery, chronic.  3. Chronic kidney disease, stage III.  4. Hypokalemia, repleted.  5. History of atrial flutter status post ablation.  6. Hypertension.  7. Diabetes mellitus.  8. History of diastolic heart failure.  9. Asthma.  10.Hyperlipidemia.  11.Seasonal allergies.  12.Long-term medication use.   HOSPITAL COURSE:  [name] is a 65 year old female with known coronary  artery disease, chronically occluded mid right coronary artery, who went  to urgent care today after a 3-week history of chest pain radiating into  the left arm.  This was intermittent and she thought it was indigestion.  In addition, she had fatigue and shortness of breath.  Because of her  symptoms and known coronary artery disease, she was admitted to [facility].  On admission, her troponin was elevated at 0.52.  Ultimately,  she did require cardiac catheterization.  The catheterization showed  left main okay, LAD with mid systolic bridging, circumflex had a  proximal 90% lesion, the RCA was occluded in the mid vessel and filled  by left to right collaterals.  Dr. Tamala Julian then performed a drug-eluting  stent implantation without difficulty.   During the patient's hospitalization, her renal function was abnormal,  up to 2.4.  She was then washed out with IV fluids and Mucomyst.  She  was seen by the Renal Service.  By discharge, her creatinine was 1.66.  By the time she was discharged, her BNP was slightly elevated and Dr.  Florene Glen felt that it was safe for [place] to go ahead and restart her Lasix as  part of admission, since her renal function did not suffer from the  cardiac catheterization contrast.   On June 03, 2008, the patient was discharged to home in stable  condition.   DISCHARGE MEDICATIONS:  1. Lasix 80 mg twice a day.  2. Cardizem 360 mg a day.  3. Pravastatin 80 mg a day.  4. Potassium 40 mEq t.i.d. (for chronically low potassium).  5. Sublingual nitroglycerin p.r.n. chest pain.  6. Plavix 75 mg a day.  7. Enteric-coated aspirin 325 mg a day.  8. Amitriptyline 25 mg at bedtime.  9. MiraLax 17 g daily as prior to admission.  10.Advair inhaler twice a day.  11.Albuterol as needed.  12.Colchicine as prior to admission.  13.Coreg 12.5 mg twice a day (increase dose)  14.Insulin as prior to admission.  15.Clonidine as prior  to admission.  16.Ambien as needed.  17.Fosamax weekly.   We asked her not to take her Marisa Severin because of her renal  insufficiency.  We asked her to remain on the diabetic diet.  Clean cath  site gently with soap and water.  No scrubbing.  No lifting over 10  pounds for 1 week.  No driving for 2 days.  She is to see Dr. Irish Lack  on June 10, 2008, at 3:15 p.m.  She is to arrive 30 minutes early  before her appointment to get a BMET, so we can assess her renal  function.      [name], P.A.      Belva Crome, M.D.  Electronically Signed    LB/MEDQ  D:  06/03/2008  T:  06/04/2008  Job:  [id number]   cc:   Jettie Booze, MD

## 2011-01-17 NOTE — Cardiovascular Report (Signed)
[name], [name]                ACCOUNT NO.:  [id number]   MEDICAL RECORD NO.:  [id number]          PATIENT TYPE:  INP   LOCATION:  6525                         FACILITY:  [facility]   PHYSICIAN:  Belva Crome, M.D.   DATE OF BIRTH:  [birth year]   DATE OF PROCEDURE:  06/01/2008  DATE OF DISCHARGE:                            CARDIAC CATHETERIZATION   INDICATIONS:  Class III angina exhibited by exertional dyspnea and  intermittent chest pain on exertion with minimal activity.  The patient  is unable to climb one flight of stairs.  The patient has a prior  history of 100% RCA occlusion and diabetes.   PROCEDURE PERFORMED:  1. Left heart cath.  2. Selective coronary angio.  3. Left ventriculography.  4. Drug-eluting stent.  5. Contrast conservation secondary to renal insufficiency.   DESCRIPTION:  After informed consent, a 6-French sheath was placed in  the right femoral artery using the Smart needle because of poor pulse.  A 6-French sheath was inserted with the Seldinger technique without  complications.  Two passes were made with the Smart needle before the  sheath could be inserted.   A 6-French, A2 multipurpose catheter was then used for hemodynamic  recordings, left ventriculography by hand injection, and selective left  and right coronary angiography.   After reviewing the digital images, we felt that the circumflex with the  likely source of ischemia over a wide territory that could be  influencing the patient's presentation with severe exertional  complaints.  She did not have any significant LAD disease.  The right  coronary was documented to be totally occluded.  We discussed the  situation with the patient, and we decided to perform PCI.  She will be  able to obtain Plavix.  We chose the DES approach because of the  patient's diabetes.   We used a 6-French, CLS 3.5 guide catheter and Asahi Prowater guidewire,  a 2.5 x 12-mm long apex balloon, and a 3.0 x 18-mm Promus  stent.  We  postdilated with a 15 mm long x 3.25-mm Quantum Maverick to 12  atmospheres.  Good balloon expansion was noted.   The patient received 600 mg of Plavix approximately 30 minutes before  stent implantation and was started on a bolus followed by an infusion of  bivalirudin (0.75 mg/kg bolus followed by 1.75 mg/kg per hour infusion).  ACT was documented to be greater than 400 seconds before the wire was  placed in the coronary.  Case was terminated without complications.  A  total of 150 mL of contrast was used.   RESULTS:  1. Hemodynamic data:      a.     Aortic pressure 181/101.      b.     Left ventricular pressure 182/27 mmHg.  2. Left ventriculography:  The left ventricle is small.  Left      ventricle is highly contractile with an EF of at least 65-70%.      Mitral annular calcification is noted.  Calcification in the      proximal circumflex is also noted.  No mitral regurgitation  is      seen.  3. Coronary angiography.      a.     Left main coronary:  Widely patent.      b.     Left anterior descending coronary:  LAD is large vessel       wraps around the left ventricular apex.  It gives origin to 2       diagonal branches.  The mid LAD beyond the first 2 septal       perforators contains a region of systolic compression.  There is       60-80% stenosis in the apical LAD.      c.     Ramus intermedius branch:  A small ramus intermedius branch       arising from the distal left main contains 95% ostial stenosis.       The ramus is very small in size and was not graftable.      d.     Circumflex artery:  Circumflex artery arises from the distal       left main.  Contains 40% proximal/ostial narrowing.  A segmental       region of 80-95% obstruction proximally.  The region of       obstruction also encompasses the first obtuse marginal.  The mid       circumflex is ectatic before the second obtuse marginal arises.       The second obtuse marginal contains 50-60% ostial  narrowing.  The       third and fourth obtuse marginal branches are widely patent.  The       circumflex between the second and third obtuse marginal is       diffusely diseased with up to 50% stenosis.  No focal high-grade       lesion is seen.  Collaterals to the right coronary are seen to       arise from the circumflex via the left atrial recurrent.  There       also appears to be some collateralization of the right coronary       from the LAD around the apex.      e.     Right coronary artery:  The right coronary artery is totally       occluded in the midvessel.  The distal vessel fills late by       collaterals.  4. Percutaneous coronary intervention:  The proximal circumflex 90%      lesion was reduced to 0% following balloon angioplasty and stenting      with a Promus DES to 3.25 mm diameter.  Side branch first obtuse      marginal which was jailed did not demonstrate any evidence of      reduced flow.   CONCLUSION:  1. Severe 2-vessel coronary artery disease with total occlusion of the      right coronary dependent on collaterals from the LAD and      circumflex.  There is also high-grade obstruction with at least 90%      proximal stenosis in the circumflex.  There is moderate disease in      the second obtuse marginal branch.  The LAD is widely patent with      the exception of systolic compression of the midvessel and 80% in      the apical LAD.  A small ramus intermedius branch contains 90%      ostial stenosis.  2. Normal  LV function.  3. Successful stenting of the circumflex with a Promus drug-eluting      stent to 3.25-mm diameter with TIMI grade 3 flow noted.   PLAN:  Aspirin and Plavix.  Plavix for at least a year.  Renal  monitoring.  Hopeful discharge within the next 48 hours if renal  function remains stable.      Belva Crome, M.D.  Electronically Signed     HWS/MEDQ  D:  06/01/2008  T:  06/02/2008  Job:  [id number]   cc:   Jettie Booze, MD   [facility]. Dema Severin, M.D.

## 2011-01-17 NOTE — Assessment & Plan Note (Signed)
[facility]                         ELECTROPHYSIOLOGY OFFICE NOTE   NAME:[name], [name]                         MRN:          [id number]  DATE:05/31/2007                            DOB:          [birth year]    [name] returns today for followup.  She is a very pleasant middle-  aged woman with a history of atrial flutter, hypertension, and diabetes  and diastolic heart failure who underwent electrophysiologic study and  catheter ablation for atrial flutter several months ago.  She  rehospitalized several weeks ago with diastolic heart failure and did  have a little bit of atrial fibrillation which resolved spontaneously.  She returns today for followup.  Her main complaint is feeling fatigued,  weak, and tired.  She does not have much energy.   MEDICATIONS:  1. Diltiazem 180 twice daily.  2. Clonidine 0.3 mg twice daily.  3. Fosamax.  4. Ambien.  5. Potassium.  6. Lasix 80 twice daily.  7. Amitriptyline 25 daily.  8. Tekturna 300 daily.  9. Advair discus, Lantus, and Humalog insulin.  10.Pravachol 40 daily.  11.Coumadin as directed.   PHYSICAL EXAMINATION:  .  She is a pleasant middle-age woman who is obese, no acute distress.  Blood pressure was 160/90, the pulse 60 and regular.  The respirations  were 16, and the weight was 253 pounds.  NECK:  No jugular venous distention.  LUNGS:  Clear bilaterally to auscultation.  No wheezes, rales, or  rhonchi are present.  CARDIOVASCULAR:  Regular rate and rhythm with normal S1 and S2.  EXTREMITIES:  No cyanosis, clubbing, or edema.  The pulses are 2+ and  symmetric.   EKG demonstrates sinus rhythm with LVH.   IMPRESSION:  1. Uncontrolled hypertension.  2. Diastolic heart failure.  3. Atrial flutter status post ablation.  4. One episode of paroxysmal atrial fibrillation.   DISCUSSION:  Ms. Threet has felt a fair amount of fatigue and weakness,  and I wonder if the Clonidine is not  contributing.  I plan on having her  follow up with Dr. Irish Lack, who is her primary cardiologist, next  Friday.  Today, I have asked her to decrease her Clonidine to 1/2 of a  0.3 mg tablet twice daily, and I have asked that she start carvedilol  6.25 mg twice daily with the ultimate goal to discontinue Clonidine and  up-titrate her carvedilol, hopefully allowing her to get off some of her  other antihypertensive drugs like the Diltiazem.  I will see her back on  p.r.n. basis.  With regard to her atrial fibrillation, I think because  of her multiple comorbidities and risk factors for stroke with diabetes  and uncontrolled hypertension, that she should stay on Coumadin for now.     Champ Mungo. Lovena Le, MD  Electronically Signed    GWT/MedQ  DD: 05/31/2007  DT: 06/01/2007  Job #: [id number]   cc:   Jettie Booze, MD

## 2011-01-17 NOTE — Discharge Summary (Signed)
NAMEALIYHA, REEVER                ACCOUNT NO.:  [id number]   MEDICAL RECORD NO.:  [id number]          PATIENT TYPE:  INP   LOCATION:  3703                         FACILITY:  [facility]   PHYSICIAN:  Jettie Booze, MDDATE OF BIRTH:  Jul 08, 1946   DATE OF ADMISSION:  05/24/2007  DATE OF DISCHARGE:  05/28/2007                               DISCHARGE SUMMARY   REFERRING PHYSICIAN:  Dr. Harlan Stains.   DISCHARGE DIAGNOSES:  1. Acute on chronic diastolic heart failure.  2. Asthma.  3. Hypertension.  4. Coronary artery disease.  5. Diabetes.  6. High cholesterol.  7. Atrial fibrillation.  8. Atrial flutter.   PROCEDURES:  Chest x-ray showed pulmonary vascular congestion.   HOSPITAL COURSE:  The patient was admitted from our office with  shortness of breath.  She had an elevated BNP and chest x-ray showing  fluid overload.  She was diuresed, but still had persistent shortness of  breath.  She also had wheezing on exam.  She was given prednisone and  Xopenex, this helped to relieve her shortness of breath.  Her blood  pressure was difficult to control.  She was restarted on clonidine, with  good blood pressure control, and an echocardiogram revealed normal left  ventricular function, with no wall motion abnormalities.  In fact, it  was hyperdynamic left ventricular function. On the day of discharge, she  was feeling well.  Her vital signs were stable.   DISCHARGE MEDICATIONS:  1. Diltiazem CD 180 mg b.i.d..  2. Fosamax 70 mg q. Week.  3. Ambien 10 mg daily.  4. Potassium 40 mEq daily.  5. Lasix 80 mg b.i.d.  6. Clonidine 0.3 mg b.i.d.  7. Amitriptyline 25 mg daily.  8. Tekturna 300 mg daily.  9. Advair Diskus 250/50 mg b.i.d.  10.Lantus insulin 50 units in the morning, 80 units in the evening.  11.Humalog insulin 45 units with each meal.  12.Albuterol inhaler p.r.n.  13.Pravastatin 40 mg every evening.  14.Coumadin.   DISCHARGE INSTRUCTIONS:  She will follow up with me in  1-2 weeks.  Unfortunately, she missed her appointment with Dr. Lovena Le for followup  of her atrial flutter ablation.  We will have to reschedule this.  She  will be on Coumadin until that time.  We will also seek a recommendation  from him regarding the fact that she had one short episode of atrial  fibrillation while in the hospital.   She will also follow up with Dr. Dema Severin regarding her diabetes.   DIET:  Low-salt diet.   CODE STATUS:  She was a full-code throughout her hospitalization.      Jettie Booze, MD  Electronically Signed     JSV/MEDQ  D:  05/28/2007  T:  05/28/2007  Job:  [id number]

## 2011-01-17 NOTE — Discharge Summary (Signed)
[name], [name]                ACCOUNT NO.:  [id number]   MEDICAL RECORD NO.:  [id number]          PATIENT TYPE:  INP   LOCATION:  2005                         FACILITY:  [facility]   PHYSICIAN:  Jettie Booze, MDDATE OF BIRTH:  18-Aug-1946   DATE OF ADMISSION:  04/15/2007  DATE OF DISCHARGE:  04/22/2007                               DISCHARGE SUMMARY   DISCHARGE DIAGNOSES:  1. Atrial flutter status post atrial fibrillation.  2. Hyperkalemia, repleted.  3. Non ST-segment elevated myocardial infarction.  4. Coumadin with therapeutic INR.  5. Diabetes.  6. Hypertension.  7. Obesity.  8. Chronic renal insufficiency.   [name] is an 65 year old female who was admitted after having  chest pain and syncope on the day prior to admission.  At that time, she  felt palpitations but since then her chest pain has resolved.  She was  found to be in atrial flutter and hospitalized.  In the emergency room,  she was started on IV Cardizem as well as p.r.n. IV Lopressor.  Ultimately, she required the assistance of Dr. Cristopher Peru from our EP  service and ultimately underwent an TE-guided electrophysiology  study/ablation.  The procedure was successful, and she was reverted back  to normal sinus rhythm.   She remained in the hospital for therapy.  Her INR was therapeutic, and  upon discharge, her INR is 1.9.   LABORATORY STUDIES:  Also include white count 10, hemoglobin 11.3,  hematocrit 38.8, platelets 231.  Sodium 137, potassium 3.7, BUN 21,  creatinine 1.52.  LFTs normal.  TSH 1.579.  CK 676, MB fraction 14.6  with a troponin of 1.19.  The transesophageal echo showed mild  atherosclerosis at the descending and aortic arch.  There was mild MR  with no left atrial appendage sclerosis identified.  EKG; the patient  was in sinus rhythm and showed LVH with repolarization abnormality with  T-wave inversion inferolaterally.   DISCHARGE MEDICATIONS:  Include:  1. Coumadin 5 mg 1-1/2 tab  daily.  2. Diltiazem 180 mg p.o. b.i.d.  3. Fosamax weekly.  4. Ambien 10 mg q.h.s. p.r.n.  5. Elavil 20 mg a day.  6. Potassium 3 times a day.  7. Digoxin 0.125 mg p.o.  8. Lasix 80 mg once a day.  9. Tekturna 300 mg a day.  10.Advair inhalation.  11.Metoprolol 25 mg twice a day.   __________ Follow up with Dr. Lovena Le on September 15 at 9:30 a.m.  Follow up with Dr. Irish Lack on May 08, 2007, at 9:15 a.m. She is to  Coumadin clinic on April 25, 2007, at 3 p.m.   The patient is discharged home in stable and improved condition.      Joesphine Bare, P.A.      Jettie Booze, MD  Electronically Signed    LB/MEDQ  D:  04/22/2007  T:  04/22/2007  Job:  [id number]   cc:   Jettie Booze, MD  [place] Lovena Le, MD

## 2011-01-18 ENCOUNTER — Ambulatory Visit (HOSPITAL_COMMUNITY)
Admission: RE | Admit: 2011-01-18 | Discharge: 2011-01-18 | Disposition: A | Discharge status: Home / Self Care | Payer: Medicare Other | Source: Ambulatory Visit | Attending: Anesthesiology | Admitting: Anesthesiology

## 2011-01-18 DIAGNOSIS — Z1231 Encounter for screening mammogram for malignant neoplasm of breast: Secondary | ICD-10-CM

## 2011-01-20 NOTE — Cardiovascular Report (Signed)
[place]  Patient:    [name], [name]                         MRN: [id number] Proc. Date: 07/09/00 Adm. Date:  [id number] Attending:  Linna Caprice CC:         C. Milta Deiters, M.D.  Cardiac Catheterization Laboratory   Cardiac Catheterization  INDICATIONS FOR PROCEDURE:  Ms. Ellsworth Lennox is a 65 year old hypertensive female who presented with chest pain.  She had developed some ST-T wave abnormalities during her hospitalization with negative cardiac enzymes.  Because of this she is brought to the catheterization lab for evaluation.  PROCEDURES: 1. Left heart catheterization. 2. Selective right and left coronary arteriography. 3. Ventriculography. 4. Distal aortogram at the level of the renal arteries to rule out    renal artery stenosis.  COMPLICATIONS:  None.  EQUIPMENT:  A 6 French Judkins configuration catheters were used.  MEDICATIONS GIVEN:  During the procedure, the patient was given a total of 40 mg IV labetalol because of hypertension and 200 mcg of intracoronary nitroglycerin.  DESCRIPTION OF PROCEDURE:  The patient was prepped and draped in the usual sterile fashion exposing the right groin.  Following local anesthetic with 1% Xylocaine, the Seldinger technique was employed and a 6 [place] introducer sheath was placed into the right femoral artery.  Selective right and left coronary arteriography and ventriculography in the RAO projection was performed.  RESULTS: 1. Hemodynamic monitoring:  Central aortic pressure 194/106, left    ventricular pressure 196/28; this is after 20 mg of IV labetalol. 2. Ventriculography:  Ventriculography in the RAO projection revealed    the LV to be hypertrophied with heavy trabeculation.  The ejection fraction    was 47% with mild global dysfunction and the end-diastolic pressure was 35. 3. Distal aortogram at the level of the renal arteries.  THe aorta is    smooth.  There is no evidence of any  renal artery stenosis.   CORONARY ARTERIOGRAPHY:  No calcification was noted on fluoroscopy. 1. Left main:  Normal. 2. LAD.  The LAD crossed the apex of the heart, had two diagonal branches    and had minimal irregularities. 3. Circumflex:  The circumflex had four large OM vessels.  There is mild    irregularities in the circumflex and the ongoing OMs.  Intracoronary    nitroglycerin was given.  These vessels plumped up in size and the    irregularities became less.  The most significant irregularity was    20%. 4. Right coronary artery:  The right coronary artery had a mid 30%    area of narrowing with the remainder of the vessel including the    posterobasilar and two posterolateral branches being normal.  CONCLUSIONS: 1. No evidence of renal artery stenosis. 2. Left ventricular hypertrophy. 3. 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 4. Mild luminal irregularities in the coronary anatomy.  DISCUSSION:  At this point there is nothing to suggest her chest pain was anginal in nature.  It is imperative she maintain good control of her blood pressure.  She already has LV dysfunction and elevated end-diastolic pressure, both of which are the result of her hypertension. DD:  07/09/00 TD:  07/09/00 Job: [id number]

## 2011-01-20 NOTE — Discharge Summary (Signed)
[place]. [facility]  Patient:    [name], [name]                         MRN: [id number] Adm. Date:  [id number] Disc. Date: [id number] Attending:  Linna Caprice Dictator:   Melissa L. Mateo Flow, M.D. CC:         Lendell Caprice, M.D., resident  [facility], fax [phone]   Discharge Summary  ADMISSION DIAGNOSES: 1. Chest pain. 2. Shortness of breath.  DISCHARGE DIAGNOSES: 1. Hypertensive heart disease. 2. New onset diabetes mellitus. 3. Hypertension.  CONSULT:  Jeanella Craze. Little, M.D., of the cardiology service was consulted on this patient.  Cardiac catheterization was performed on July 09, 2000.  HISTORY OF PRESENT ILLNESS:   This is a 65 year old African-American female with a past medical history significant for hypertension and asthma who presented to the [facility] Emergency Room with progressive shortness of breath and chest pain at rest over the past several months.  The patient complained of her chest hurting associated with diaphoresis, nausea and tingling down both arms.  These episodes lasted approximately 15 minutes and occur at rest.  they happen at least once daily.  The patient was prescribed a nitroglycerin patch but has not worn it since March of 2001, secondary to that it gives her a headache.  The patient also notes that she has increased shortness of breath and finds that she is unable to go to the mailbox without shortness of breath, something she could do a year ago.  REVIEW OF SYSTEMS:  Positive for polydipsia, polyuria, and decreased appetite x several weeks and a cough productive of clear phlegm but she had no URI symptoms.  There were no fever or chills and no sick contacts.  HOSPITAL COURSE:  The patient was admitted and initial EKG was obtained.  EKG showed ST depression in leads I, II, aVL and V5 and V6 indicating inferior lateral ischemia; however, these were EKG changes that were present on EKG of March of 2001.   The patient initially ruled out for MI.  She had hemoglobin of 17.0 and was found to have a blood glucose of 948, sodium 127, potassium 4.0, chloride 88, bicarb of 30. The patient was admitted to the telemetry floor with a diagnosis of chest pain secondary most likely to unstable angina.  The patient was continued on her at home medications, heart regimen including Captopril, hydrochlorothiazide, K-Dur, clonidine and was started on beta-blocker and aspirin.  A lipid profile was checked.  EKGs were performed daily.  The patient was also started on sliding scale insulin.  The patient had a second episode of chest pain and shortness of breath on Sunday morning while being seen by cardiology.  Cardiology then started her on heparin and made arrangements for the patient to be cardiac catheterized on Monday, July 09, 2000.  The patients cardiac catheterization revealed hypertensive heart disease, an ejection fraction of 47% and ___________ pressures of 35, no real artery stenosis and no significant coronary artery disease.  The patients heparin was discontinued status post catheterization and the patient had an unremarkable post procedure course.  The patients ABs continued to be somewhat out of control.  Her CBGs while in the hospital were continuously in the 300 range in spite of the sliding scale insulin.  The patient was seen by the diabetes education team and hemoglobin A1C was shown to be 12.2.  DISPOSITION:  The patient was discharged home the first  day after her catheterization.  DISCHARGE MEDICATIONS:  She was started on Dyazide instead of the hydrochlorothiazide one tablet everyday.  Also K-Dur 20 mEq everyday.  The patient was continued on her Captopril 100 mg twice a day and clonidine 0.2 mg twice a day. She was started on aspirin 325 mg everyday, Tylenol 25 mg everyday, glipizide 10 mg everyday and started on Mevacor 20 mg everyday. The patient was to continue her  ________________, her Fosamax, her Diltiazem and her albuterol as needed.  She was encouraged to try exercise three times a week, to start a 2000 calorie ADA diet.  FOLLOW-UP:  She is to be seen at [facility] on Tuesday, July 17, 2000, at 11:45 a.m.  The patient will be followed by [facility] for her diabetes, asthma, and hypertension management.  DISCHARGE CONDITION:  The patient was discharged home in stable condition. DD:  07/10/00 TD:  07/10/00 Job: [id number]

## 2011-01-20 NOTE — Procedures (Signed)
[name], [name]               ACCOUNT NO.:  [id number]   MEDICAL RECORD NO.:  [id number]          PATIENT TYPE:  OUT   LOCATION:  SLEEP CENTER                 FACILITY:  [facility]   PHYSICIAN:  Clinton D. Annamaria Boots, M.D. DATE OF BIRTH:  11/17/1945   DATE OF STUDY:                              NOCTURNAL POLYSOMNOGRAM   REFERRING PHYSICIAN:  Dr. Aldona Bar.   DATE OF STUDY:  October 19, 2005.   INDICATION FOR STUDY:  Hypersomnia with sleep apnea.   EPWORTH SLEEPINESS SCORE:  4/24.   BMI:  38.   WEIGHT:  232 pounds.   HOME MEDICATIONS:  Diltiazem, Fosamax, Ambien 10 milligrams, Lasix with  potassium, HCTZ, clonidine, omeprazole, Pepcid, sulindac, Zyrtec,  Glucovance, digoxin, Avandia, Advair, Lantus.   SLEEP ARCHITECTURE:  Total sleep time 332 minutes with sleep efficiency 79%.  Stage I was 3%, stage II 67%, stages III and IV were absent, REM 10% of  total sleep time. Sleep latency 55 minutes, REM latency 279 minutes, awake  after sleep onset 32 minutes, arousal index 8.7. Ambien was taken at 9:30  and insulin at 9 p.m.   RESPIRATORY DATA:  Apnea/hypopnea index (AHI, RDI) 7 obstructive events per  hour indicating mild obstructive sleep apnea/hypopnea syndrome. This  included 2 obstructive apneas and 37 hypopneas. Events were not positional  but seems somewhat more common with sleeping on back with head elevated. REM  AHI of 34.1. There were insufficient events to trigger use of C-PAP  titration by split protocol on the study night.   OXYGEN DATA:  Moderate to loud snoring with oxygen desaturation to a nadir  of 84%. Mean oxygen saturation through the study was 92% on room air.   CARDIAC DATA:  Normal sinus rhythm.   MOVEMENT/PARASOMNIA:  Occasional leg jerk, insignificant.   IMPRESSION/RECOMMENDATIONS:  1.  Mild obstructive sleep apnea/hypopnea syndrome, AHI 7 per hour. Events      were more common in REM and while on back with head elevated. Moderate      to loud  snoring with oxygen desaturation to a nadir of 84%.  2.  Scores in this range are usually not treated with C-PAP unless clinical      circumstance requires. Conservative      therapies including weight loss, sleeping off flat of back and treatment      for nasal congestion may be more appropriate.      Clinton D. Annamaria Boots, M.D.  Diplomate, Tax adviser of Sleep Medicine  Electronically Signed     CDY/MEDQ  D:  10/22/2005 11:52:22  T:  10/22/2005 23:04:43  Job:  [id number]

## 2011-01-20 NOTE — Cardiovascular Report (Signed)
NAMEJASMAN, VEITH                ACCOUNT NO.:  [id number]   MEDICAL RECORD NO.:  [id number]          PATIENT TYPE:  OIB   LOCATION:  2899                         FACILITY:  [place] [facility]   PHYSICIAN:  Jettie Booze, MDDATE OF BIRTH:  01/21/1946   DATE OF PROCEDURE:  08/30/2006  DATE OF DISCHARGE:                            CARDIAC CATHETERIZATION   REFERRING PHYSICIAN:  Emeline General. Dema Severin, M.D.   PROCEDURES PERFORMED:  1. Left heart catheterization.  2. Coronary angiogram.  3. Abdominal aortogram.   OPERATOR:  Jettie Booze, MD   INDICATIONS:  Chest pain and abnormal stress test.   PROCEDURE NARRATIVE:  The risks and benefits of cardiac catheterization  were explained to the patient and informed consent was obtained.  The  patient was brought to the catheterization lab and placed on the table.  She was prepped and draped in the usual sterile fashion.  Her right  groin was infiltrated with 1% lidocaine.  A 6-French arterial sheath was  placed into the right femoral artery using modified Seldinger technique.  Left coronary artery angiography was performed using a JL-4.0 catheter.  The catheter was advanced to the vessel ostium under fluoroscopic  guidance.  Digital angiography was performed in multiple projections  using hand injection of contrast.  Right coronary artery angiography was  performed using a JR-4.0 catheter.  The catheter was advanced to the  vessel ostium under fluoroscopic guidance.  Digital angiography was  performed using hand injection of contrast.  A pigtail catheter was  advanced to the aortic valve and into the left ventricle under  fluoroscopic guidance.  Hemodynamic pressure assessment was performed.  Ventriculography was not performed because of the patient's renal  insufficiency.  The catheter was pulled back under continuous  hemodynamic pressure monitoring.  The catheter was pulled to the level  of the renal arteries.  A power injection of  contrast done in the AP  projection.  The sheath was remained were removed and an Angio-Seal was  deployed for hemostasis.  The patient was having some trouble lying flat  because of breathing issues.   FINDINGS:  The left main was widely patent.  The left circumflex was a  large vessel.  There were three large obtuse marginals, all with  moderate atherosclerosis.  The circumflex had mild irregularities.  The  obtuse marginals were medium-sized vessels.  The LAD was a large vessel  with mild irregularities.  There is a small D1 and a small D2.  The  right coronary artery appeared chronically occluded in the midportion.  The proximal portion of the vessel was diffusely diseased.  The distal  right fills by left-to-right collaterals.  Left ventriculogram not  performed to minimize contrast use.   HEMODYNAMIC RESULTS:  Left ventricular pressure 164/15, LVEDP of 39  mmHg.  Aortic pressure 164/83 with a mean aortic pressure 115 mmHg.  Abdominal aortogram showed mild atherosclerosis in the infrarenal  portion.  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  There are single renal arteries to both kidneys  with mild atherosclerosis in both.  There  is no hemodynamically  significant disease.   IMPRESSION:  1. Occluded right coronary artery.  This territory supplied by left-to-      right collaterals.  2. Markedly increased left ventricular end-diastolic pressure.  3. No renal artery stenosis.   RECOMMENDATIONS:  1. Will give a dose of IV Lasix post catheterization.  The patient      will need aggressive blood pressure control and risk factor      modification.  She admittedly does not take her medicines as      regularly as she should.  We discussed the importance of compliance      in terms of her long-term health.  2. I think she will also need more diuresis to keep her LVEDP down.  3. I will follow up with the patient in the office.  She will  also      follow with Dr. Dema Severin.  4. She should also have an abdominal ultrasound in 6 months to      evaluate her small infrarenal aneurysm.  If it remains small, she      will likely just need yearly follow-up.  5. The patient should also be on a cholesterol-lowering medicine.  We      will also add Norvasc for additional blood pressure control.      Jettie Booze, MD  Electronically Signed     JSV/MEDQ  D:  08/30/2006  T:  08/30/2006  Job:  [id number]

## 2011-01-20 NOTE — Discharge Summary (Signed)
[place]. [facility]  Patient:    [name], [name]                        MRN: [id number] Adm. Date:  [id number] Disc. Date: [id number] Attending:  Starla Link Dictator:   Charlcie Cradle CC:         [facility], Attn. Dr. Sela Hilding                           Discharge Summary  DISCHARGE DIAGNOSES:  1. Congestive heart failure with diastolic dysfunction only per     echocardiogram done this admission with preliminary results showing     an ejection fraction of approximately 70% with good left ventricular     function.  2. Hypertension.  3. Asthma.  4. Osteoporosis.  5. History of a right breast lump removal, which was benign.  6. Questionable coronary artery disease.  The patient reports she had a     catheterization done in 2000 in a [place] hospital, which the patient     reports was normal.  DISCHARGE MEDICATIONS:  1. Hydrochlorothiazide 25 mg q.d.  2. Diltiazem extended release 240 mg p.o. q.d.  3. Captopril 100 mg p.o. b.i.d.  4. Clonidine 0.2 mg p.o. b.i.d.  5. Flovent 220 mcg 2 puffs b.i.d.  6. Albuterol 2 puffs p.r.n.  7. Allegra 60 mg p.o. q.d.  8. K-Dur 20 mEq p.o. q.d.  9. Fosamax 10 mg p.o. q.d. 10. Vioxx 25 mg p.o. q.d.  PROCEDURES: Echocardiogram.  Final results are still pending; however, preliminary results show an ejection fraction of approximately 70% with very mild tricuspid regurgitation and no other valvular regurgitation noted.  The patient has ventricular wall hypertrophy but with good left ventricular function.  HISTORY OF PRESENT ILLNESS: This patient is a 65 year old black female who moved to [place], [place] approximately one year ago from [place].  The patient came in reporting shortness of breath x 2 days with production of white sputum.  The patient reports the shortness of breath had been progressively getting worse and she reported that she had been wheezing. The patient reported that she had  not been taking her Flovent as she had been directed.  The patient also reported a long-standing history of chest pain since childhood.  The patient describes the pain as an intermittent substernal pressure sensation usually lasting about five minutes with associated nausea and diaphoresis but no associated shortness of breath, and there is no radiation of the pain.  The patients also reports on Review Of Systems that her hair had been falling out in clumps.  ADMISSION MEDICATIONS:  1. Lasix 40 mg q.a.m.  2. Clonidine 0.2 mg b.i.d.  3. Captopril 50 mg q.i.d.  4. Procardia XL, which she had not been taking.  5. Flovent 2 puffs b.i.d.  6. Labetalol 200 mg b.i.d.  7. Digoxin 0.125 mg q.d.  8. Cardura 5 mg t.i.d.  9. Albuterol p.r.n. 10. Allegra 60 mg b.i.d. 11. K-Dur 10 mEq q.d. 12. Fosamax 10 mg q.d. 13. Vioxx 25 mg q.d.  PHYSICAL EXAMINATION:  VITAL SIGNS: Temperature 97.5 degrees, blood pressure 222/118, pulse 61, respirations 26.  Pulse oximetry 97% on room air.  HEENT:  Positive alopecia in the temporal areas.  HEART:  Regular rhythm but bradycardic with no murmurs, rubs, or gallops noted.  CHEST: Per the ER physician the patient was found to be acutely short  of breath with positive rales and rhonchi in the bases of her lungs bilaterally and was initially placed on BiPAP and given 80 mg of Lasix.  Upon my examination status post BiPAP and Lasix the patient had no rales, wheezes, or rhonchi.  She had decreased breath sounds in the bases bilaterally but she was in no respiratory distress and oxygen saturation was 97% on room air.  EXTREMITIES: Showed 1+ edema of the lower extremities bilaterally.  LABORATORY DATA: The patients initial laboratory work showed a WBC of 8.9, hemoglobin 14.4.  INR 1.0.  Complete metabolic panel had no significant findings.  Urinalysis was negative.  Initial CK was 390, MB 4.2, and troponin was less than 0.03.  The patients initial EKG showed  no evidence for ischemia and some nonspecific ST changes.  The patient initial chest x-ray showed edema and a questionable secondary density at the aortic valve.  HOSPITAL COURSE: The patient was admitted with a diagnosis of CHF exacerbation, asthma, and hypertension. #1 - CONGESTIVE HEART FAILURE: The patient was significantly improved after 80 mg of Lasix.  She had a repeat chest x-ray done which showed significantly decreased edema status post Lasix 80 mg and the patient was saturating well on room air.  The patient was continued on Lasix 80 mg p.o. q.d. while she was in the hospital.  The patient remained on room air throughout her hospitalization.  On hospital day #1 the patient continued to be clear to auscultation bilaterally and in no respiratory distress.  The patient had a 2D echocardiogram which revealed results as stated above.  Given the patients evidence of diastolic dysfunction on her echocardiogram with no evidence of systolic dysfunction the patients digoxin was discontinued in addition to her Lasix being discontinued.  The patient will be followed up for her CHF as an outpatient at [facility].  #2 - HYPERTENSION: The patient came in on multiple medications for her blood pressure and although her blood pressure was 222/118 on admission her blood pressure remained stable in the 150s and 160s/60s and 70s while she was in the hospital.  Given the patients extensive medication regimen and the fact that she reports she is not taking her medicines secondary to them being too expensive, and the fact that we felt we could maximize her medical treatment with fewer medications, her medications were changed as follows.  The patient was discontinued on her Lasix, Procardia XL, labetalol, Cardura, digoxin, and the patient was started instead on hydrochlorothiazide 25 mg q.d., diltiazem extended release 240 mg q.d.  She will remain on her Captopril and she will remain on her  Clonidine.  In the future if the patients blood pressure is  under good control she could be considered to be weaned off her Clonidine. The patients blood pressure was stable upon discharge.  #3 - CHEST PAIN: The patient was brought in and ruled out for MI with serial cardiac enzymes.  Her CK was 297, MB 2.4, and then 300 and 2.5.  Her troponin I remained less than 0.03.  Her EKG remained normal with no evidence of ischemia.  As the patients chest pain was atypical on admission she will be discharged without any further work-up.  #4 - CHRONIC BACK PAIN: The patient was on Vioxx and fosamax when she was admitted to the hospital and in speaking with the patient she felt that these medications really helped her back pain.  For this reason the patient will remain on these medications at discharge.  DISCHARGE CONDITION:  The patient is discharged in stable condition.  FOLLOW-UP: She will be following up with [facility] on Wednesday, December 06, 1999, at 9 a.m. DD:  11/30/99 TD:  12/01/99 Job: [id number]

## 2011-05-24 ENCOUNTER — Encounter: Payer: Self-pay | Admitting: Internal Medicine

## 2011-05-24 ENCOUNTER — Ambulatory Visit (INDEPENDENT_AMBULATORY_CARE_PROVIDER_SITE_OTHER): Payer: Medicare Other | Admitting: Internal Medicine

## 2011-05-24 DIAGNOSIS — K7689 Other specified diseases of liver: Secondary | ICD-10-CM

## 2011-05-24 DIAGNOSIS — R1033 Periumbilical pain: Secondary | ICD-10-CM

## 2011-05-24 DIAGNOSIS — K219 Gastro-esophageal reflux disease without esophagitis: Secondary | ICD-10-CM

## 2011-05-24 MED ORDER — PEG-KCL-NACL-NASULF-NA ASC-C 100 G PO SOLR: 1.0000 | Freq: Once | ORAL | Status: DC

## 2011-05-24 MED ORDER — PANTOPRAZOLE SODIUM 40 MG PO TBEC: 40.0000 mg | DELAYED_RELEASE_TABLET | Freq: Every day | ORAL | Status: DC

## 2011-05-24 NOTE — Progress Notes (Signed)
[name] [birth year] MRN [id number]   History of Present Illness:  This is a 65 year old African American female complaining of multiple digestive problems. She describes her symptoms very vaguely. It appears she is having gastroesophageal reflux with some swallowing problems but also abdominal gas, distention, dyspepsia and constipation which has been relieved with MiraLax 17 g daily to the point where she is having diarrhea. She also has a history of coronary artery disease, diastolic heart failure and atrial flutter. She is status post cardioversion by Dr. Lovena Le. She has had obesity and insulin-dependent diabetes. She has been on Plavix. Nexium was discontinued because of possible interference with Plavix. She is now having reflux symptoms. An upper abdominal ultrasound in June 2010 showed a prominent main pancreatic duct being at the upper limits of normal and fatty liver. A gastric emptying scan in April 2008 was normal with a3% percent remaining in the stomach after 2 hours. She had a colonoscopy and possibly an endoscopy in 2007 by Dr. Michail Sermon.   Past Medical History  Diagnosis Date  . Atrial flutter   . Hypertension   . Diabetes mellitus   . Diastolic heart failure   . Asthma   . Hyperlipidemia   . Fatty liver   . Esophageal dysmotility   . Myocardial infarction   . Arthritis   . Kidney disease   . Congestive heart failure   . Sleep apnea     wears CPAP  . Family history of malignant neoplasm of gastrointestinal tract    Past Surgical History  Procedure Date  . Coronary angioplasty with stent placement   . Breast lumpectomy     right  . Tubal ligation     reports that she has never smoked. She does not have any smokeless tobacco history on file. She reports that she does not drink alcohol or use illicit drugs. family history includes Cancer in her mother; Colon cancer in her maternal aunt; Heart disease in her mother; Kidney disease in her mother; Ovarian cancer in  her daughter; and Stomach cancer in her maternal uncle. Allergies  Allergen Reactions  . Codeine   . Penicillins   . Sulfa Antibiotics         Review of Systems: Positive for intermittent dysphagia and odynophagia. Positive for abdominal pain constipation and diarrhea positive for all occasional rectal bleeding  The remainder of the 10  point ROS is negative except as outlined in H&P   Physical Exam: General appearance  Well developed, in no distress, massively obese. Eyes- non icteric. HEENT nontraumatic, no thrush. Mouth no lesions, tongue papillated, no cheilosis. Neck supple without adenopathy, thyroid not enlarged, no carotid bruits, no JVD. Lungs Clear to auscultation bilaterally, on her back she has some large lipoma or fibroma in the left flank, it is slightly tender. Cor normal S1 normal S2, regular rhythm , no murmur,  quiet precordium. Abdomen protuberant and obese with mild tenderness in left lower quadrant and epigastrium. Normal active bowel sounds. No fluid wave. No surgical scars. Rectal soft Hemoccult negative stool. Extremities no pedal edema. Skin no lesions. Neurological alert and oriented x 3. Psychological normal mood and affect.  Assessment and   Problem #1 constipation relieved with MiraLax 17 g daily. I asked her to reduce the dose to 9 g daily or to switch to every other day dosing to avoid diarrhea. The rectal bleeding needs to be evaluated. We will proceed with a colonoscopy.  Problem #2  Dysphagia and dyspepsia. We will evaluate with  an upper endoscopy. We will start her on Protonix 40 mg daily and antireflux measures.  Problem #3 lipoma or fibroma at left flank. This is not related to patient's GI symptoms. She would like to have it evaluated. I would discourage this to be removed.   05/24/2011 Delfin Edis

## 2011-05-24 NOTE — Patient Instructions (Signed)
You have been scheduled for an endoscopy and colonoscopy. Please follow the written instructions given to you at your visit today. Please pick up your Moviprep at the pharmacy within the next 2-3 days. We have sent the following medications to your pharmacy for you to pick up at your convenience: Protonix 40 mg by mouth once daily. CC: Dr Noah Delaine

## 2011-05-30 ENCOUNTER — Telehealth: Payer: Self-pay | Admitting: Internal Medicine

## 2011-05-30 NOTE — Telephone Encounter (Signed)
Received copies from [facility] 05/30/2011. Forwarded  17pages to Dr. Haywood Filler review.

## 2011-06-01 LAB — BASIC METABOLIC PANEL
BUN: 18
CO2: 28
Calcium: 9.2
Chloride: 101
Creatinine, Ser: 1.88 — ABNORMAL HIGH
GFR calc Af Amer: 33 — ABNORMAL LOW
GFR calc non Af Amer: 27 — ABNORMAL LOW
Glucose, Bld: 167 — ABNORMAL HIGH
Potassium: 3.4 — ABNORMAL LOW
Sodium: 139

## 2011-06-01 LAB — POCT HEMOGLOBIN-HEMACUE: Hemoglobin: 13.5

## 2011-06-05 LAB — BASIC METABOLIC PANEL
BUN: 10
BUN: 15
BUN: 21
BUN: 25 — ABNORMAL HIGH
CO2: 23
CO2: 27
CO2: 27
CO2: 28
Calcium: 8.7
Calcium: 8.9
Calcium: 9
Calcium: 9.5
Chloride: 104
Chloride: 106
Chloride: 109
Chloride: 98
Creatinine, Ser: 1.5 — ABNORMAL HIGH
Creatinine, Ser: 1.56 — ABNORMAL HIGH
Creatinine, Ser: 1.73 — ABNORMAL HIGH
Creatinine, Ser: 1.97 — ABNORMAL HIGH
GFR calc Af Amer: 31 — ABNORMAL LOW
GFR calc Af Amer: 36 — ABNORMAL LOW
GFR calc Af Amer: 41 — ABNORMAL LOW
GFR calc Af Amer: 43 — ABNORMAL LOW
GFR calc non Af Amer: 26 — ABNORMAL LOW
GFR calc non Af Amer: 30 — ABNORMAL LOW
GFR calc non Af Amer: 34 — ABNORMAL LOW
GFR calc non Af Amer: 35 — ABNORMAL LOW
Glucose, Bld: 180 — ABNORMAL HIGH
Glucose, Bld: 215 — ABNORMAL HIGH
Glucose, Bld: 250 — ABNORMAL HIGH
Glucose, Bld: 255 — ABNORMAL HIGH
Potassium: 3.3 — ABNORMAL LOW
Potassium: 3.7
Potassium: 3.8
Potassium: 4
Sodium: 136
Sodium: 140
Sodium: 140
Sodium: 142

## 2011-06-05 LAB — GLUCOSE, CAPILLARY
Glucose-Capillary: 129 — ABNORMAL HIGH
Glucose-Capillary: 146 — ABNORMAL HIGH
Glucose-Capillary: 180 — ABNORMAL HIGH
Glucose-Capillary: 181 — ABNORMAL HIGH
Glucose-Capillary: 192 — ABNORMAL HIGH
Glucose-Capillary: 197 — ABNORMAL HIGH
Glucose-Capillary: 199 — ABNORMAL HIGH
Glucose-Capillary: 205 — ABNORMAL HIGH
Glucose-Capillary: 219 — ABNORMAL HIGH
Glucose-Capillary: 227 — ABNORMAL HIGH
Glucose-Capillary: 235 — ABNORMAL HIGH
Glucose-Capillary: 236 — ABNORMAL HIGH
Glucose-Capillary: 237 — ABNORMAL HIGH
Glucose-Capillary: 241 — ABNORMAL HIGH
Glucose-Capillary: 244 — ABNORMAL HIGH
Glucose-Capillary: 247 — ABNORMAL HIGH
Glucose-Capillary: 250 — ABNORMAL HIGH
Glucose-Capillary: 251 — ABNORMAL HIGH
Glucose-Capillary: 255 — ABNORMAL HIGH
Glucose-Capillary: 256 — ABNORMAL HIGH
Glucose-Capillary: 260 — ABNORMAL HIGH
Glucose-Capillary: 262 — ABNORMAL HIGH
Glucose-Capillary: 268 — ABNORMAL HIGH
Glucose-Capillary: 284 — ABNORMAL HIGH

## 2011-06-05 LAB — RENAL FUNCTION PANEL
Albumin: 3.4 — ABNORMAL LOW
BUN: 11
CO2: 24
Calcium: 8.9
Chloride: 107
Creatinine, Ser: 1.66 — ABNORMAL HIGH
GFR calc Af Amer: 38 — ABNORMAL LOW
GFR calc non Af Amer: 31 — ABNORMAL LOW
Glucose, Bld: 234 — ABNORMAL HIGH
Phosphorus: 1.9 — ABNORMAL LOW
Potassium: 4.2
Sodium: 141

## 2011-06-05 LAB — COMPREHENSIVE METABOLIC PANEL
ALT: 19
AST: 23
Albumin: 3.8
Alkaline Phosphatase: 72
BUN: 30 — ABNORMAL HIGH
CO2: 30
Calcium: 9.8
Chloride: 98
Creatinine, Ser: 2.38 — ABNORMAL HIGH
GFR calc Af Amer: 25 — ABNORMAL LOW
GFR calc non Af Amer: 21 — ABNORMAL LOW
Glucose, Bld: 164 — ABNORMAL HIGH
Potassium: 2.8 — ABNORMAL LOW
Sodium: 139
Total Bilirubin: 0.8
Total Protein: 7.2

## 2011-06-05 LAB — DIFFERENTIAL
Basophils Absolute: 0
Basophils Relative: 1
Eosinophils Absolute: 0.7
Eosinophils Relative: 9 — ABNORMAL HIGH
Lymphocytes Relative: 27
Lymphs Abs: 2.3
Monocytes Absolute: 1.1 — ABNORMAL HIGH
Monocytes Relative: 13 — ABNORMAL HIGH
Neutro Abs: 4.2
Neutrophils Relative %: 51

## 2011-06-05 LAB — CBC
HCT: 37.9
HCT: 40.8
Hemoglobin: 12.9
Hemoglobin: 13.8
MCHC: 33.9
MCHC: 34
MCV: 95.7
MCV: 97.2
Platelets: 165
Platelets: 167
RBC: 3.96
RBC: 4.2
RDW: 14.8
RDW: 15.2
WBC: 7.8
WBC: 8.2

## 2011-06-05 LAB — OCCULT BLOOD X 1 CARD TO LAB, STOOL: Fecal Occult Bld: NEGATIVE

## 2011-06-05 LAB — CK TOTAL AND CKMB (NOT AT ARMC)
CK, MB: 4.3 — ABNORMAL HIGH
CK, MB: 4.7 — ABNORMAL HIGH
CK, MB: 5.2 — ABNORMAL HIGH
Relative Index: 1.2
Relative Index: 1.4
Relative Index: 1.7
Total CK: 309 — ABNORMAL HIGH
Total CK: 340 — ABNORMAL HIGH
Total CK: 360 — ABNORMAL HIGH

## 2011-06-05 LAB — LIPASE, BLOOD: Lipase: 28

## 2011-06-05 LAB — MAGNESIUM: Magnesium: 2.2

## 2011-06-05 LAB — B-NATRIURETIC PEPTIDE (CONVERTED LAB)
Pro B Natriuretic peptide (BNP): 258 — ABNORMAL HIGH
Pro B Natriuretic peptide (BNP): 366 — ABNORMAL HIGH
Pro B Natriuretic peptide (BNP): 490 — ABNORMAL HIGH
Pro B Natriuretic peptide (BNP): 625 — ABNORMAL HIGH
Pro B Natriuretic peptide (BNP): 831 — ABNORMAL HIGH

## 2011-06-05 LAB — CARDIAC PANEL(CRET KIN+CKTOT+MB+TROPI)
CK, MB: 3.4
CK, MB: 3.9
Relative Index: 1
Relative Index: 1.2
Total CK: 335 — ABNORMAL HIGH
Total CK: 347 — ABNORMAL HIGH
Troponin I: 0.47 — ABNORMAL HIGH
Troponin I: 0.54

## 2011-06-05 LAB — PROTIME-INR
INR: 0.9
Prothrombin Time: 12.7

## 2011-06-05 LAB — TROPONIN I
Troponin I: 0.8
Troponin I: 1.07

## 2011-06-05 LAB — POCT CARDIAC MARKERS
CKMB, poc: 6.9
CKMB, poc: 8.4
Myoglobin, poc: 361
Myoglobin, poc: 397
Troponin i, poc: 0.52
Troponin i, poc: 0.62

## 2011-06-05 LAB — DIGOXIN LEVEL: Digoxin Level: <0.2 — ABNORMAL LOW

## 2011-06-15 LAB — BASIC METABOLIC PANEL
BUN: 10
BUN: 12
BUN: 12
BUN: 13
BUN: 16
CO2: 26
CO2: 27
CO2: 27
CO2: 28
CO2: 31
Calcium: 8.6
Calcium: 9
Calcium: 9.5
Calcium: 9.6
Calcium: 9.8
Chloride: 100
Chloride: 101
Chloride: 105
Chloride: 105
Chloride: 108
Creatinine, Ser: 1.62 — ABNORMAL HIGH
Creatinine, Ser: 1.66 — ABNORMAL HIGH
Creatinine, Ser: 1.75 — ABNORMAL HIGH
Creatinine, Ser: 1.77 — ABNORMAL HIGH
Creatinine, Ser: 1.87 — ABNORMAL HIGH
GFR calc Af Amer: 33 — ABNORMAL LOW
GFR calc Af Amer: 35 — ABNORMAL LOW
GFR calc Af Amer: 36 — ABNORMAL LOW
GFR calc Af Amer: 38 — ABNORMAL LOW
GFR calc Af Amer: 39 — ABNORMAL LOW
GFR calc non Af Amer: 27 — ABNORMAL LOW
GFR calc non Af Amer: 29 — ABNORMAL LOW
GFR calc non Af Amer: 30 — ABNORMAL LOW
GFR calc non Af Amer: 31 — ABNORMAL LOW
GFR calc non Af Amer: 32 — ABNORMAL LOW
Glucose, Bld: 116 — ABNORMAL HIGH
Glucose, Bld: 134 — ABNORMAL HIGH
Glucose, Bld: 161 — ABNORMAL HIGH
Glucose, Bld: 298 — ABNORMAL HIGH
Glucose, Bld: 79
Potassium: 3 — ABNORMAL LOW
Potassium: 3.1 — ABNORMAL LOW
Potassium: 3.3 — ABNORMAL LOW
Potassium: 3.5
Potassium: 4.1
Sodium: 139
Sodium: 139
Sodium: 143
Sodium: 143
Sodium: 144

## 2011-06-15 LAB — CBC
HCT: 35.2 — ABNORMAL LOW
HCT: 36.1
HCT: 36.3
HCT: 37
Hemoglobin: 11.7 — ABNORMAL LOW
Hemoglobin: 12.2
Hemoglobin: 12.4
Hemoglobin: 12.7
MCHC: 33.3
MCHC: 33.8
MCHC: 34.1
MCHC: 34.2
MCV: 92.9
MCV: 93
MCV: 94.3
MCV: 94.4
Platelets: 232
Platelets: 241
Platelets: 245
Platelets: 253
RBC: 3.73 — ABNORMAL LOW
RBC: 3.82 — ABNORMAL LOW
RBC: 3.91
RBC: 3.98
RDW: 14.9 — ABNORMAL HIGH
RDW: 15.1 — ABNORMAL HIGH
RDW: 15.2 — ABNORMAL HIGH
RDW: 15.7 — ABNORMAL HIGH
WBC: 7.1
WBC: 8.5
WBC: 8.6
WBC: 9.2

## 2011-06-15 LAB — TSH: TSH: 2.847

## 2011-06-15 LAB — TROPONIN I
Troponin I: 0.05
Troponin I: 0.05

## 2011-06-15 LAB — PROTIME-INR
INR: 1.4
INR: 1.5
INR: 1.6 — ABNORMAL HIGH
INR: 1.7 — ABNORMAL HIGH
INR: 2 — ABNORMAL HIGH
Prothrombin Time: 17.6 — ABNORMAL HIGH
Prothrombin Time: 18.7 — ABNORMAL HIGH
Prothrombin Time: 19.5 — ABNORMAL HIGH
Prothrombin Time: 20.8 — ABNORMAL HIGH
Prothrombin Time: 22.9 — ABNORMAL HIGH

## 2011-06-15 LAB — CK TOTAL AND CKMB (NOT AT ARMC)
CK, MB: 4.5 — ABNORMAL HIGH
CK, MB: 5.3 — ABNORMAL HIGH
Relative Index: 0.5
Relative Index: 0.6
Total CK: 826 — ABNORMAL HIGH
Total CK: 859 — ABNORMAL HIGH

## 2011-06-15 LAB — B-NATRIURETIC PEPTIDE (CONVERTED LAB): Pro B Natriuretic peptide (BNP): 490 — ABNORMAL HIGH

## 2011-06-16 LAB — BASIC METABOLIC PANEL
BUN: 21
BUN: 24 — ABNORMAL HIGH
CO2: 29
CO2: 29
Calcium: 10
Calcium: 10.1
Chloride: 100
Chloride: 100
Creatinine, Ser: 1.52 — ABNORMAL HIGH
Creatinine, Ser: 1.64 — ABNORMAL HIGH
GFR calc Af Amer: 39 — ABNORMAL LOW
GFR calc Af Amer: 42 — ABNORMAL LOW
GFR calc non Af Amer: 32 — ABNORMAL LOW
GFR calc non Af Amer: 35 — ABNORMAL LOW
Glucose, Bld: 162 — ABNORMAL HIGH
Glucose, Bld: 191 — ABNORMAL HIGH
Potassium: 3.1 — ABNORMAL LOW
Potassium: 3.7
Sodium: 137
Sodium: 138

## 2011-06-16 LAB — PROTIME-INR
INR: 1.2
INR: 1.4
INR: 1.7 — ABNORMAL HIGH
INR: 1.9 — ABNORMAL HIGH
Prothrombin Time: 15.1
Prothrombin Time: 17.4 — ABNORMAL HIGH
Prothrombin Time: 20.7 — ABNORMAL HIGH
Prothrombin Time: 22.8 — ABNORMAL HIGH

## 2011-06-16 LAB — CBC
HCT: 38.5
HCT: 38.8
HCT: 38.9
HCT: 40.3
Hemoglobin: 13.1
Hemoglobin: 13.3
Hemoglobin: 13.4
Hemoglobin: 13.9
MCHC: 34.1
MCHC: 34.3
MCHC: 34.4
MCHC: 34.5
MCV: 92
MCV: 92
MCV: 93
MCV: 93.6
Platelets: 226
Platelets: 231
Platelets: 232
Platelets: 240
RBC: 4.11
RBC: 4.17
RBC: 4.23
RBC: 4.39
RDW: 14.1 — ABNORMAL HIGH
RDW: 14.2 — ABNORMAL HIGH
RDW: 14.3 — ABNORMAL HIGH
RDW: 14.5 — ABNORMAL HIGH
WBC: 10
WBC: 9.1
WBC: 9.3
WBC: 9.8

## 2011-06-16 LAB — HEPARIN LEVEL (UNFRACTIONATED)
Heparin Unfractionated: 0.19 — ABNORMAL LOW
Heparin Unfractionated: 0.25 — ABNORMAL LOW
Heparin Unfractionated: 0.28 — ABNORMAL LOW
Heparin Unfractionated: 0.32
Heparin Unfractionated: 0.43

## 2011-06-19 LAB — HEPARIN LEVEL (UNFRACTIONATED)
Heparin Unfractionated: 0.17 — ABNORMAL LOW
Heparin Unfractionated: 0.18 — ABNORMAL LOW
Heparin Unfractionated: 0.28 — ABNORMAL LOW
Heparin Unfractionated: 0.29 — ABNORMAL LOW

## 2011-06-19 LAB — BASIC METABOLIC PANEL
BUN: 30 — ABNORMAL HIGH
BUN: 31 — ABNORMAL HIGH
BUN: 35 — ABNORMAL HIGH
CO2: 27
CO2: 28
CO2: 28
Calcium: 9.2
Calcium: 9.4
Calcium: 9.9
Chloride: 100
Chloride: 101
Chloride: 99
Creatinine, Ser: 1.98 — ABNORMAL HIGH
Creatinine, Ser: 2.02 — ABNORMAL HIGH
Creatinine, Ser: 2.23 — ABNORMAL HIGH
GFR calc Af Amer: 27 — ABNORMAL LOW
GFR calc Af Amer: 30 — ABNORMAL LOW
GFR calc Af Amer: 31 — ABNORMAL LOW
GFR calc non Af Amer: 22 — ABNORMAL LOW
GFR calc non Af Amer: 25 — ABNORMAL LOW
GFR calc non Af Amer: 26 — ABNORMAL LOW
Glucose, Bld: 121 — ABNORMAL HIGH
Glucose, Bld: 129 — ABNORMAL HIGH
Glucose, Bld: 153 — ABNORMAL HIGH
Potassium: 2.8 — ABNORMAL LOW
Potassium: 3.1 — ABNORMAL LOW
Potassium: 3.2 — ABNORMAL LOW
Sodium: 138
Sodium: 139
Sodium: 139

## 2011-06-19 LAB — CBC
HCT: 38.3
HCT: 38.6
HCT: 43.6
Hemoglobin: 13.1
Hemoglobin: 13.4
Hemoglobin: 14.6
MCHC: 33.6
MCHC: 34.3
MCHC: 34.8
MCV: 91.8
MCV: 92.9
MCV: 93.7
Platelets: 191
Platelets: 245
Platelets: 265
RBC: 4.12
RBC: 4.21
RBC: 4.66
RDW: 14.3 — ABNORMAL HIGH
RDW: 14.4 — ABNORMAL HIGH
RDW: 14.7 — ABNORMAL HIGH
WBC: 10.3
WBC: 11.1 — ABNORMAL HIGH
WBC: 9.8

## 2011-06-19 LAB — COMPREHENSIVE METABOLIC PANEL
ALT: 19
AST: 29
Albumin: 3.3 — ABNORMAL LOW
Alkaline Phosphatase: 60
BUN: 24 — ABNORMAL HIGH
CO2: 27
Calcium: 9.4
Chloride: 102
Creatinine, Ser: 2.27 — ABNORMAL HIGH
GFR calc Af Amer: 27 — ABNORMAL LOW
GFR calc non Af Amer: 22 — ABNORMAL LOW
Glucose, Bld: 151 — ABNORMAL HIGH
Potassium: 3 — ABNORMAL LOW
Sodium: 140
Total Bilirubin: 0.6
Total Protein: 7.4

## 2011-06-19 LAB — CK TOTAL AND CKMB (NOT AT ARMC)
CK, MB: 12.4 — ABNORMAL HIGH
CK, MB: 14.6 — ABNORMAL HIGH
Relative Index: 2
Relative Index: 2.2
Total CK: 633 — ABNORMAL HIGH
Total CK: 676 — ABNORMAL HIGH

## 2011-06-19 LAB — MAGNESIUM: Magnesium: 2.3

## 2011-06-19 LAB — PROTIME-INR
INR: 1
INR: 1
Prothrombin Time: 13.3
Prothrombin Time: 13.5

## 2011-06-19 LAB — TSH: TSH: 1.579

## 2011-06-19 LAB — TROPONIN I: Troponin I: 1.15

## 2011-06-21 ENCOUNTER — Ambulatory Visit: Payer: Medicare Other | Admitting: Rehabilitation

## 2011-06-29 ENCOUNTER — Encounter: Payer: Self-pay | Admitting: Internal Medicine

## 2011-06-29 ENCOUNTER — Ambulatory Visit (AMBULATORY_SURGERY_CENTER): Payer: Medicare Other | Admitting: Internal Medicine

## 2011-06-29 DIAGNOSIS — K2270 Barrett's esophagus without dysplasia: Secondary | ICD-10-CM

## 2011-06-29 DIAGNOSIS — D179 Benign lipomatous neoplasm, unspecified: Secondary | ICD-10-CM | POA: Insufficient documentation

## 2011-06-29 DIAGNOSIS — K2940 Chronic atrophic gastritis without bleeding: Secondary | ICD-10-CM

## 2011-06-29 DIAGNOSIS — K219 Gastro-esophageal reflux disease without esophagitis: Secondary | ICD-10-CM

## 2011-06-29 DIAGNOSIS — R1033 Periumbilical pain: Secondary | ICD-10-CM

## 2011-06-29 DIAGNOSIS — D126 Benign neoplasm of colon, unspecified: Secondary | ICD-10-CM

## 2011-06-29 DIAGNOSIS — Z1213 Encounter for screening for malignant neoplasm of small intestine: Secondary | ICD-10-CM

## 2011-06-29 LAB — GLUCOSE, CAPILLARY
Glucose-Capillary: 148 mg/dL — ABNORMAL HIGH (ref 70–99)
Glucose-Capillary: 131 mg/dL — ABNORMAL HIGH (ref 70–99)

## 2011-06-29 MED ORDER — ESOMEPRAZOLE MAGNESIUM 40 MG PO CPDR: 40.0000 mg | DELAYED_RELEASE_CAPSULE | Freq: Every day | ORAL | Status: DC

## 2011-06-29 MED ORDER — SODIUM CHLORIDE 0.9 % IV SOLN: 500.0000 mL | INTRAVENOUS | Status: DC

## 2011-06-29 MED ORDER — DICYCLOMINE HCL 10 MG PO CAPS
10.0000 mg | ORAL_CAPSULE | Freq: Four times a day (QID) | ORAL | Status: DC | PRN
Start: 2011-06-29 — End: 2011-11-10

## 2011-06-29 NOTE — Progress Notes (Signed)
08-29-11 at 12:34 Pt up to bathroom.  Assisted to BR but gait steady.  Pt passed gas and states she is feeling fine and ready to go home

## 2011-06-29 NOTE — Patient Instructions (Signed)
Please follow all discharge instructions given to you by the recovery room nurse. If you have any questions or problems after discharge please call [phone]. You will receive a phone call in the am to see how you are doing and answer any questions you may have. Thank you for choosing [facility] for your health care needs.  

## 2011-06-30 ENCOUNTER — Telehealth: Payer: Self-pay | Admitting: *Deleted

## 2011-06-30 NOTE — Telephone Encounter (Signed)

## 2011-07-04 ENCOUNTER — Encounter: Payer: Self-pay | Admitting: Internal Medicine

## 2011-11-10 ENCOUNTER — Other Ambulatory Visit: Payer: Self-pay | Admitting: Internal Medicine

## 2012-01-09 ENCOUNTER — Ambulatory Visit (INDEPENDENT_AMBULATORY_CARE_PROVIDER_SITE_OTHER): Payer: Medicare Other | Admitting: Ophthalmology

## 2012-01-09 DIAGNOSIS — H35039 Hypertensive retinopathy, unspecified eye: Secondary | ICD-10-CM

## 2012-01-09 DIAGNOSIS — I10 Essential (primary) hypertension: Secondary | ICD-10-CM

## 2012-01-09 DIAGNOSIS — H43819 Vitreous degeneration, unspecified eye: Secondary | ICD-10-CM

## 2012-01-09 DIAGNOSIS — H2510 Age-related nuclear cataract, unspecified eye: Secondary | ICD-10-CM

## 2012-01-09 DIAGNOSIS — E11319 Type 2 diabetes mellitus with unspecified diabetic retinopathy without macular edema: Secondary | ICD-10-CM

## 2012-01-09 DIAGNOSIS — E1139 Type 2 diabetes mellitus with other diabetic ophthalmic complication: Secondary | ICD-10-CM

## 2012-01-09 DIAGNOSIS — H35369 Drusen (degenerative) of macula, unspecified eye: Secondary | ICD-10-CM

## 2012-01-09 DIAGNOSIS — E1165 Type 2 diabetes mellitus with hyperglycemia: Secondary | ICD-10-CM

## 2012-04-09 ENCOUNTER — Other Ambulatory Visit: Payer: Self-pay | Admitting: Internal Medicine

## 2012-10-03 ENCOUNTER — Other Ambulatory Visit: Payer: Self-pay | Admitting: Internal Medicine

## 2012-10-18 ENCOUNTER — Ambulatory Visit: Payer: Medicare Other | Admitting: Dietician

## 2012-10-19 ENCOUNTER — Encounter: Payer: Self-pay | Admitting: *Deleted

## 2012-10-19 ENCOUNTER — Encounter: Payer: Medicare Other | Attending: Cardiology | Admitting: *Deleted

## 2012-10-19 VITALS — Ht 64.0 in | Wt 248.2 lb

## 2012-10-19 DIAGNOSIS — E119 Type 2 diabetes mellitus without complications: Secondary | ICD-10-CM | POA: Insufficient documentation

## 2012-10-19 DIAGNOSIS — Z713 Dietary counseling and surveillance: Secondary | ICD-10-CM | POA: Insufficient documentation

## 2012-10-19 NOTE — Patient Instructions (Addendum)
Goals:  Follow Diabetes Meal Plan as instructed  Add lean protein foods to all meals/snacks  Measure portions of carbs  Eat 3 meals and 2 snacks, every 3-5 hrs  Limit carbohydrate intake to 30 grams carbohydrate/meal  Limit carbohydrate intake to 15 grams carbohydrate/snack  Monitor glucose levels as instructed by your doctor  Aim for 20-30 mins of physical activity daily - LOOK INTO SIL[employer]NE[employer]ERS PROGRAM @ [employer]; SWIMMING  Bring glucose log to your next nutrition visit

## 2012-10-19 NOTE — Progress Notes (Signed)
  Medical Nutrition Therapy:  Appt start time: [id number]   End time:  [id number].   Assessment: T2DM.  Pt comes today for DM education for the first time since dx 12 years ago.  Excessive meal skipping noted.  Last A1c 8.2% (09/29/12). Pustules on bilateral lower legs noted. Advised to consult PCP or dermatologist. Referred to Amy Y. [place], MD. Struggles with financial issues and cannot f/u consistently with PCP.  Has stent placed, though high fat/fried foods reported.  DM Self-Mgmt Assessment 10/19/2012  Time since Diabetes Dx 12 years  Type of Diabetes Type 2  Previous DM Education? No  Work/school missed in year for illness None - unemployed  Personal health rating (1 = best) 5  How often is MD seen for diabetes? Every 3-6 mos depending on finances  Hospitalizations in past year 0  Do you check your glucose? Yes  How often? 4-5 times/day  Do you check your feet? Yes  How often? Daily  Do you exercise? No  Do you take aspirin? Yes  Do you use alcohol? No  Do you use tobacco? No  Eye exam within a year? Yes  Do you follow a meal plan? No   MEDICATIONS:  Reviewed. See medication list.   DIETARY INTAKE:  Usual eating pattern includes 1 meals and 0-1 snack per day. Daughter cooks all meals and is gone from 4am - 12:30pm. Pt does not eat during the time she is away.   24-hr recall:  B (AM): NONE; hot tea w/ lemon and 1/4 c sugar Snk (AM): NONE  L ( PM): NONE Snk ( PM): NONE D ( PM): Baked or fried fish, broccoli, candied yams Snk ( PM): NONE Beverages: SF drinks  Usual physical activity: None d/t pain in back  Estimated energy needs: 1200 calories 135 g carbohydrates (30g per meal/15g per snack) 80 g protein  35-40 g fat  Progress Towards Goal(s):  In progress.   Nutritional Diagnosis:  [place]-2.1 Impaired nutrition utilization related to glucose metabolism as evidenced by recent A1c of 8.2% and MD referral for MNT.    Intervention:  Nutrition education including CHO metabolism, CHO  counting, low CHO meal planning, and effects of exercise on BG.  Handouts given during visit include:  Living Well with Diabetes ([employer])  Target Blood Glucose Levels  Low CHO Snack List  Meal Planning Card  Samples given during visit include:  Boost Calorie Smart Supplement: 12 bottles Lot: [id number] Exp: 12/14/12   Carnation Breakfast Essentials (No Added Sugar): 4 pkts Lot: [id number] Exp: 01/12/13  Monitoring/Evaluation:  Dietary intake, exercise, A1c, and body weight in 4 week(s).

## 2012-10-21 ENCOUNTER — Encounter: Payer: Self-pay | Admitting: *Deleted

## 2012-11-19 ENCOUNTER — Ambulatory Visit: Payer: Medicare Other | Admitting: *Deleted

## 2012-12-13 ENCOUNTER — Ambulatory Visit: Payer: Medicare Other | Admitting: *Deleted

## 2012-12-26 ENCOUNTER — Encounter: Payer: Self-pay | Admitting: *Deleted

## 2012-12-26 ENCOUNTER — Encounter: Payer: Medicare Other | Attending: Cardiology | Admitting: *Deleted

## 2012-12-26 VITALS — Ht 64.0 in | Wt 242.3 lb

## 2012-12-26 DIAGNOSIS — E119 Type 2 diabetes mellitus without complications: Secondary | ICD-10-CM | POA: Insufficient documentation

## 2012-12-26 DIAGNOSIS — Z713 Dietary counseling and surveillance: Secondary | ICD-10-CM | POA: Insufficient documentation

## 2012-12-26 NOTE — Progress Notes (Signed)
  Medical Nutrition Therapy:  Appt start time: 1930   End time:  [id number].   Primary concern today: T2DM, F/U.  Pt comes today for F/U. No longer skipping meals.  Reports recent blood work drawn, though she was not notified of results.  Pustules on bilateral lower legs resolved. Has cut down high fat/fried foods to once a week.  DM Self-Mgmt Assessment 10/19/2012  Time since Diabetes Dx 12 years  Type of Diabetes Type 2  Previous DM Education? No  Work/school missed in year for illness None - unemployed  Personal health rating (1 = best) 5  How often is MD seen for diabetes? Every 3-6 mos depending on finances  Hospitalizations in past year 0  Do you check your glucose? Yes  How often? 4-5 times/day  Do you check your feet? Yes  How often? Daily  Do you exercise? No  Do you take aspirin? Yes  Do you use alcohol? No  Do you use tobacco? No  Eye exam within a year? Yes  Do you follow a meal plan? No   CBG: 4+ times daily Avg FBG: 80 mg Recent A1C: Unknown  MEDICATIONS:  No changes reported   DIETARY INTAKE:  Usual eating pattern includes 2-3 meals and 0-1 snacks per day. Daughter cooks all meals and is gone from 4am - 12:30pm.   24-hr recall:  B: Boiled egg white and pc of wheat toast, skim milk OR oatmeal, milk S: NONE or banana w/ peanut butter  L: Baked salmon, 1/3 cup rice (white) S: NONE D: Stir fry veggies w/ steak umms, 1/3 c rice S: NONE  Usual physical activity:  None d/t pain in knees and back. Discussed chair exercises.   Estimated energy needs: 1200 calories 135 g carbohydrates (30g per meal/15g per snack) 80 g protein  35-40 g fat  Progress Towards Goal(s):  In progress.   Samples given during visit include:   NIKE Essentials (NAS): 7 pkts Lot: [id number]; Exp: 03/31/13  Boost Glucose Control  Lot: [id number]; Exp: 07/25/13 - 3 bottles Lot: [id number]; Exp: 08/17/13 - 6 bottles  Nutritional Diagnosis:  [place]-2.1 Impaired nutrition  utilization related to glucose metabolism as evidenced by recent A1c of 8.2% and MD referral for MNT.    Intervention:  Nutrition education including CHO metabolism, CHO counting, low CHO meal planning, and effects of exercise on BG.  Monitoring/Evaluation:  Dietary intake, exercise, A1c, and body weight in 3 month(s).

## 2012-12-26 NOTE — Patient Instructions (Addendum)
Goals:  Follow Diabetes Meal Plan as instructed  Add lean protein foods to all meals/snacks  Measure portions of carbs  Eat 3 meals and 2 snacks, every 3-5 hrs  Limit carbohydrate intake to 30 grams carbohydrate/meal  Limit carbohydrate intake to 15 grams carbohydrate/snack  Monitor glucose levels as instructed by your doctor  Aim for 20-30 mins of physical activity daily - LOOK INTO S[employer]VER [employer] PROGRAM; Try swimming or chair exercises

## 2013-01-08 ENCOUNTER — Ambulatory Visit (INDEPENDENT_AMBULATORY_CARE_PROVIDER_SITE_OTHER): Payer: Medicare Other | Admitting: Ophthalmology

## 2013-02-14 ENCOUNTER — Ambulatory Visit (INDEPENDENT_AMBULATORY_CARE_PROVIDER_SITE_OTHER): Payer: Self-pay | Admitting: Ophthalmology

## 2013-03-28 ENCOUNTER — Encounter: Payer: Self-pay | Admitting: *Deleted

## 2013-03-28 ENCOUNTER — Encounter: Payer: Medicare Other | Attending: Internal Medicine | Admitting: *Deleted

## 2013-03-28 VITALS — Ht 64.0 in | Wt 236.5 lb

## 2013-03-28 DIAGNOSIS — Z713 Dietary counseling and surveillance: Secondary | ICD-10-CM | POA: Insufficient documentation

## 2013-03-28 DIAGNOSIS — E119 Type 2 diabetes mellitus without complications: Secondary | ICD-10-CM | POA: Insufficient documentation

## 2013-03-28 NOTE — Progress Notes (Addendum)
  Medical Nutrition Therapy:  Appt start time: 1030   End time:  1100.   Primary concern today: T2DM, F/U.  Pt comes today for F/U. Is titrating both insulins on her own and reports consistent episodes of hypoglycemia. Some days takes 50 units of lantus in a.m. and 70 units in around 7-9 pm. Also waking in the middle of the night with a 44-60 mg readings. Advised pt and her daughter to make appt with Dr. Chalmers Cater or Noah Delaine asap regarding a possible sliding scale. Also urged her to resume MD ordered insulin regimen until this happens. Has lost 12 lbs since first visit in 12/2012.  DM Self-Mgmt Assessment 10/19/2012  Time since Diabetes Dx 12 years  Type of Diabetes Type 2  Previous DM Education? No  Work/school missed in year for illness None - unemployed  Personal health rating (1 = best) 5  How often is MD seen for diabetes? Every 3-6 mos depending on finances  Hospitalizations in past year 0  Do you check your glucose? Yes  How often? 4-5 times/day  Do you check your feet? Yes  How often? Daily  Do you exercise? No  Do you take aspirin? Yes  Do you use alcohol? No  Do you use tobacco? No  Eye exam within a year? Yes  Do you follow a meal plan? No   CBG: 4+ times daily Avg FBG: 44-60 mg Avg 2hPP: 180-190 mg Avg Pre-Pran: 118-122 mg Recent A1C: Pt reports A1c and all other labs were "good" recently (per Dr. Noah Delaine).  MEDICATIONS:  No changes reported   DIETARY INTAKE:  Usual eating pattern includes 2-3 meals and 0-1 snacks per day. Daughter cooks all meals and is gone from 4am - 12:30pm.   24-hr recall:  B: Banana S: NONE   L: Fish (baked) S: NONE D: Meatloaf, spinach, green beans, sauteed onions S: NONE  Usual physical activity:  None d/t pain in knees and back. Has checked into [employer] program, but has not been yet. States intent to "start next week". Reiterated how exercise can/will help with BG.  Estimated energy needs: 1200 calories 135 g carbohydrates (30g  per meal/15g per snack) 80 g protein  35-40 g fat  Progress Towards Goal(s):  In progress.  Nutritional Diagnosis:  [place]-2.1 Impaired nutrition utilization related to glucose metabolism as evidenced by recent A1c of 8.2% and MD referral for MNT.    Samples given during visit include:   NIKE Essentials (NAS): 20 pkts Lot: [id number]; Exp: 11/23/13   Boost Glucose Control  Lot: [id number]; Exp: 07/25/13 - 3 bottles  Lot: [id number]; Exp: 10/28/13 - 6 bottles   Intervention:  Nutrition education including CHO metabolism, CHO counting, low CHO meal planning, and effects of exercise on BG.  Monitoring/Evaluation:  Dietary intake, exercise, A1c, and body weight in 3 month(s).

## 2013-03-28 NOTE — Patient Instructions (Addendum)
Goals:  Follow Diabetes Meal Plan as instructed  Add lean protein foods to all carbs  Measure portions of carbs  Eat 3 meals and 2 snacks, every 3-5 hrs  Limit carbohydrate intake to 30 grams and 15 grams carbohydrate/snack  Monitor glucose levels as instructed by your doctor  Aim for 20-30 mins of physical activity daily - LOOK INTO S[employer]VER [employer]AKERS PROGRAM; Try swimming or chair exercises  Contact Dr. Chalmers Cater or Dr. Noah Delaine regarding a sliding scale for insulin

## 2013-03-29 ENCOUNTER — Other Ambulatory Visit: Payer: Self-pay | Admitting: Internal Medicine

## 2013-04-11 ENCOUNTER — Ambulatory Visit (INDEPENDENT_AMBULATORY_CARE_PROVIDER_SITE_OTHER): Payer: Medicare Other | Admitting: Ophthalmology

## 2013-04-11 DIAGNOSIS — H3530 Unspecified macular degeneration: Secondary | ICD-10-CM

## 2013-04-11 DIAGNOSIS — E11319 Type 2 diabetes mellitus with unspecified diabetic retinopathy without macular edema: Secondary | ICD-10-CM

## 2013-04-11 DIAGNOSIS — H35039 Hypertensive retinopathy, unspecified eye: Secondary | ICD-10-CM

## 2013-04-11 DIAGNOSIS — H2510 Age-related nuclear cataract, unspecified eye: Secondary | ICD-10-CM

## 2013-04-11 DIAGNOSIS — H43819 Vitreous degeneration, unspecified eye: Secondary | ICD-10-CM

## 2013-04-11 DIAGNOSIS — I10 Essential (primary) hypertension: Secondary | ICD-10-CM

## 2013-04-11 DIAGNOSIS — E1139 Type 2 diabetes mellitus with other diabetic ophthalmic complication: Secondary | ICD-10-CM

## 2013-05-20 ENCOUNTER — Encounter: Payer: Self-pay | Admitting: Internal Medicine

## 2013-05-28 ENCOUNTER — Encounter: Payer: Self-pay | Admitting: Internal Medicine

## 2013-06-27 ENCOUNTER — Encounter: Payer: Medicare Other | Attending: Internal Medicine | Admitting: *Deleted

## 2013-06-27 ENCOUNTER — Encounter: Payer: Self-pay | Admitting: *Deleted

## 2013-06-27 VITALS — Ht 64.0 in | Wt 244.2 lb

## 2013-06-27 DIAGNOSIS — E119 Type 2 diabetes mellitus without complications: Secondary | ICD-10-CM

## 2013-06-27 DIAGNOSIS — Z713 Dietary counseling and surveillance: Secondary | ICD-10-CM | POA: Insufficient documentation

## 2013-06-27 NOTE — Progress Notes (Addendum)
  Medical Nutrition Therapy:  Appt start time: [id number]   End time:  950.   Primary concern today: T2DM, F/U.  [name] reports extreme hypoglycemic episode ~2 weeks ago where daughter found her passed out on the floor and EMT was called. Does not recall BG at the time. Meal skipping noted, which is likely causing these episodes. Pt also continues to titrate her own insulin. Reports extreme chronic constipation. Previously used miralax daily with no relief.  Sees Dr. Chalmers Cater on 07/01/13 and plans to discuss both issues.   DM Self-Mgmt Assessment 10/19/2012  Time since Diabetes Dx 12 years  Type of Diabetes Type 2  Previous DM Education? No  Work/school missed in year for illness None - unemployed  Personal health rating (1 = best) 5  How often is MD seen for diabetes? Every 3-6 mos depending on finances  Hospitalizations in past year 0  Do you check your glucose? Yes  How often? 4-5 times/day  Do you check your feet? Yes  How often? Daily  Do you exercise? No  Do you take aspirin? Yes  Do you use alcohol? No  Do you use tobacco? No  Eye exam within a year? Yes  Do you follow a meal plan? No   CBG: 4+ times daily Avg FBG: 32-50; 86 mg/dL Avg 1hPP: 150-182 mg/dL Avg Pre-Pran:  52-86 mg/dL Recent A1C: Pt reports A1c and all other labs were "good" recently (per Dr. Noah Delaine).  MEDICATIONS:  No changes reported   DIETARY INTAKE:  Usual eating pattern includes 1 meal and 1-2 snacks per day. Daughter cooks all meals and is gone from 4am - 12:30pm.   24-hr recall:  B (10 AM): NONE or lg banana (gets up around 4-5 AM) S: NONE    L (11 AM): Grits and cup of hot tea (lemon, little sugar or sweetener) - 11am: 52-86 mg/dL S: NONE D:  Squash, zucchini (sauteed), 3 oz baked fish S (11-11:30 PM): Crackers (handful) w/ cheese - but stomach today hurts d/t diverticulitis Fluids: Water (3 bottles/water)  Usual physical activity:  None d/t pain in knees and back. Has obtained [employer] program,  but has not been yet d/t pain. States intent to "start next week". Reiterated how exercise can/will help with BG; Advised to get into the pool.   Estimated energy needs: 1200 calories 135 g carbohydrates (30g per meal/15g per snack) 80 g protein  35-40 g fat  Progress Towards Goal(s):  In progress.  Nutritional Diagnosis:  [place]-2.1 Impaired nutrition utilization related to glucose metabolism as evidenced by recent A1c of 8.2% and MD referral for MNT.    Samples given during visit include:   Carnation Breakfast Essentials (NAS): 14 pkts Lot: [id number]; Exp: 11/23/13  Intervention:  Continued nutrition education including CHO metabolism, CHO counting, low CHO meal planning, and effects of exercise on BG. Also discussed effects of meal skipping   Teaching Method Utilized: Visual  Barriers to learning/adherence to lifestyle change:  Appears difficult for pt to understand basic concepts of CHO counting and the need for consistent dietary intake.   Demonstrated degree of understanding via:  Teach Back   Monitoring/Evaluation:  Dietary intake, exercise, A1c, and body weight in 6 week(s).

## 2013-06-27 NOTE — Patient Instructions (Addendum)
Goals:  Follow Diabetes Meal Plan as instructed  Add lean protein foods to all carbs  Measure portions of carbs  Eat 3 meals and 2 snacks, or every 3-5 hrs  Aim carbohydrate intake to 30 grams and 15 grams carbohydrate per snack  Aim for 20-30 mins of physical activity daily - Try swimming or chair exercises  Have a snack before exercising to prevent low blood sugar  Contact Dr. Chalmers Cater and/or Dr. Noah Delaine regarding a sliding scale for insulin  *Keep glucose tabs (or similar) with you at all times.

## 2013-07-29 ENCOUNTER — Telehealth: Payer: Self-pay

## 2013-07-29 ENCOUNTER — Ambulatory Visit (INDEPENDENT_AMBULATORY_CARE_PROVIDER_SITE_OTHER): Payer: Medicare Other | Admitting: Nurse Practitioner

## 2013-07-29 ENCOUNTER — Encounter: Payer: Self-pay | Admitting: Nurse Practitioner

## 2013-07-29 VITALS — BP 122/84 | HR 78 | Ht 64.0 in | Wt 242.5 lb

## 2013-07-29 DIAGNOSIS — R1032 Left lower quadrant pain: Secondary | ICD-10-CM

## 2013-07-29 DIAGNOSIS — K2270 Barrett's esophagus without dysplasia: Secondary | ICD-10-CM

## 2013-07-29 DIAGNOSIS — G4733 Obstructive sleep apnea (adult) (pediatric): Secondary | ICD-10-CM

## 2013-07-29 DIAGNOSIS — K5900 Constipation, unspecified: Secondary | ICD-10-CM | POA: Insufficient documentation

## 2013-07-29 DIAGNOSIS — K219 Gastro-esophageal reflux disease without esophagitis: Secondary | ICD-10-CM | POA: Insufficient documentation

## 2013-07-29 MED ORDER — LINACLOTIDE 145 MCG PO CAPS: 145.0000 ug | ORAL_CAPSULE | Freq: Every day | ORAL | Status: DC

## 2013-07-29 MED ORDER — RANITIDINE HCL 150 MG PO TABS: 150.0000 mg | ORAL_TABLET | Freq: Every day | ORAL | Status: DC

## 2013-07-29 NOTE — Patient Instructions (Addendum)
Start taking the Linzess 145 mcg samples one tablet by mouth once daily. If this medication helps please call our office so we can send in a prescription.   Please add a fiber supplement such as Benefiber over the counter daily.   We have sent the following medications to your pharmacy for you to pick up at your convenience: Zantac at bedtime to help with reflux.   You have been given a reflux diet.   You have been scheduled for an endoscopy with propofol. Please follow written instructions given to you at your visit today. If you use inhalers (even only as needed), please bring them with you on the day of your procedure.

## 2013-07-29 NOTE — Progress Notes (Signed)
     History of Present Illness:  Patient is a 67 year old female known to Dr. Olevia Perches. She has a history of [name]'s esophagus and received a letter that it was time for recall EGD. Patient has multiple medical problems. She is on multiple medications including plavix for history of cardiac stent placement. She takes daily Nexium but complains of frequent heartburn and regurgitation. Describes "knot" sensation in her throat. She often feels like food gets stuck in her throat.   Patient has chronic abdominal pain and chronic constipation.  She took Mirlax for years but it caused cramps and stopped working. Diet is lacking in fiber. .   Current Medications, Allergies, Past Medical History, Past Surgical History, Family History and Social History were reviewed in [employer] record.  Physical Exam: General: Pleasant, well developed , black female in no acute distress Head: Normocephalic and atraumatic Eyes:  sclerae anicteric, conjunctiva pink  Ears: Normal auditory acuity Lungs: Clear throughout to auscultation Heart: Regular rate and rhythm Abdomen: Soft, protuberant, non-tender. No masses, no hepatomegaly. Normal bowel sounds Musculoskeletal: Symmetrical with no gross deformities  Extremities: No edema  Neurological: Alert oriented x 4, grossly nonfocal Psychological:  Alert and cooperative. Normal mood and affect  Assessment and Recommendations: 1. [name]'s esophagus. Patient received EGD recall letter. The benefits, risks, and potential complications of EGD with possible biopsies and/or dilation were discussed with the patient and she agrees to proceed. Patient should be off plavix for procedure. We will contact prescribing physician about this.   2. GERD, still symptomatic on daily PPI. She also reports intermittent solid food dysphagia.  Will add Zantac at bedtime. GERD literature given. Will assess for esophageal stricture at time of EGD.   3. CAD, history of  stents. On chronic plavix.  4. OSA, uses C-pap   5. Chronic constipation, low back and LLQ pain. Colonoscopy October 2012 revealed diverticular disease and polyps (hyperplastic). Miralax no longer working and causes cramps. MOM works but not ideal for maintenance.  Trial of Linzess, samples given. Add daily Benefiber

## 2013-07-29 NOTE — Telephone Encounter (Signed)
  07/29/2013   RE: [name] DOB: 01-21-1946 MRN: [id number]   Dear Dr. Einar Gip,    We have scheduled the above patient for an endoscopic procedure. Our records show that she is on anticoagulation therapy.   Please advise as to how long the patient may come off her therapy of Plavix prior to the procedure, which is scheduled for 08/15/13.  Please fax back/ or route the completed form to [place] at [phone].   Sincerely,    Marlon Pel, CMA  Please respond by 08/04/13 and route back to Marlon Pel, [place]

## 2013-07-30 ENCOUNTER — Encounter: Payer: Self-pay | Admitting: Nurse Practitioner

## 2013-07-31 NOTE — Progress Notes (Signed)
Reviewed and agree.

## 2013-08-04 ENCOUNTER — Encounter: Payer: Self-pay | Admitting: Internal Medicine

## 2013-08-04 NOTE — Telephone Encounter (Signed)
          Holding anti platelets for GI w/u  Received: 5 days ago     Laverda Page, MD Marzella Schlein, CMA            I saw her in July 2014. She has been doing well. Okay to hold Plavix but prefer to continue ASA unless high risk.    EKG 07/31/2012 NSR @ 67/min. NOrmal intervals. Lateral ischemia with T inversion in I, aVL. No change from ECG 12/21/11.   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 High grade Ramus intermediate stenosis (very small), RCA 100% occluded in mid segment with collaterals from left. LAD 30-40% stenosis.   Echo- 10/03/12: Left ventricular cavity is normal in size. Normal systolic global function. Calculated EF 66%. Doppler evidence of Grade I (impaired) diastolic dysfunction (abnormal relaxation)   Lexiscan stress 09/27/12: EKG shows NSR, poor R wave progression, no ischemia. Stress EKG is non diagnostic. The perfusion study demonstrated normal isotope uptake both at rest and stress. There was no evidence of ischemia or scar. Left ventricular ejection fraction was estimated to be 59%.

## 2013-08-04 NOTE — Telephone Encounter (Signed)
Patient notified to hold Plavix 5 days prior to her procedure. Pt verbalized understanding.

## 2013-08-05 ENCOUNTER — Telehealth: Payer: Self-pay | Admitting: Internal Medicine

## 2013-08-05 MED ORDER — LINACLOTIDE 145 MCG PO CAPS: 145.0000 ug | ORAL_CAPSULE | Freq: Every day | ORAL | Status: DC

## 2013-08-05 NOTE — Telephone Encounter (Signed)
rx sent under Tye Savoy, NP since she saw patient last and suggested that patient call if she needed a prescription.

## 2013-08-08 ENCOUNTER — Ambulatory Visit: Payer: Medicare Other | Admitting: *Deleted

## 2013-08-14 ENCOUNTER — Telehealth: Payer: Self-pay | Admitting: Internal Medicine

## 2013-08-14 ENCOUNTER — Ambulatory Visit: Payer: Medicare Other | Admitting: *Deleted

## 2013-08-14 NOTE — Telephone Encounter (Signed)
Told pt that would not need to bring her CPAP, we give her oxygen through her nasal cannula and monitor her sats closely

## 2013-08-15 ENCOUNTER — Encounter: Payer: Self-pay | Admitting: Internal Medicine

## 2013-08-15 ENCOUNTER — Ambulatory Visit (AMBULATORY_SURGERY_CENTER): Payer: Medicare Other | Admitting: Internal Medicine

## 2013-08-15 VITALS — BP 126/71 | HR 58 | Temp 98.3°F | Resp 18 | Ht 64.0 in | Wt 242.0 lb

## 2013-08-15 DIAGNOSIS — K219 Gastro-esophageal reflux disease without esophagitis: Secondary | ICD-10-CM

## 2013-08-15 DIAGNOSIS — K2270 Barrett's esophagus without dysplasia: Secondary | ICD-10-CM

## 2013-08-15 LAB — GLUCOSE, CAPILLARY
Glucose-Capillary: 99 mg/dL (ref 70–99)
Glucose-Capillary: 102 mg/dL — ABNORMAL HIGH (ref 70–99)

## 2013-08-15 MED ORDER — SODIUM CHLORIDE 0.9 % IV SOLN: 500.0000 mL | INTRAVENOUS | Status: DC

## 2013-08-15 NOTE — Patient Instructions (Signed)

## 2013-08-15 NOTE — Progress Notes (Signed)
Patient did not experience any of the following events: a burn prior to discharge; a fall within the facility; wrong site/side/patient/procedure/implant event; or a hospital transfer or hospital admission upon discharge from the facility. (G8907) Patient did not have preoperative order for IV antibiotic SSI prophylaxis. (G8918)  

## 2013-08-15 NOTE — Op Note (Signed)
[facility]  [address] [place], [id number]   ENDOSCOPY PROCEDURE REPORT  PATIENT: [name], [name]  MR#: [id number] BIRTHDATE: 02-16-1946 , 60  yrs. old GENDER: Female ENDOSCOPIST: Lafayette Dragon, MD REFERRED BY:  [name], M.D. PROCEDURE DATE:  08/15/2013 PROCEDURE:  EGD w/ biopsy ASA CLASS:     Class III INDICATIONS:  history of [name]'s esophagus.   [name]'s esophagus on upper endoscopy in October 2012, intestinal metaplasia and goblet cells. MEDICATIONS: MAC sedation, administered by CRNA and Propofol (Diprivan) 80 mg IV TOPICAL ANESTHETIC: Cetacaine Spray  DESCRIPTION OF PROCEDURE: After the risks benefits and alternatives of the procedure were thoroughly explained, informed consent was obtained.  The [id number] endoscope was introduced through the mouth and advanced to the second portion of the duodenum. Without limitations.  The instrument was slowly withdrawn as the mucosa was fully examined.      Esophagus: The esophageal mucosa appeared normal in the proximal and mid and distal esophagus with Z line was irregular. Multiple biopsies were obtained to followup on [name]'s esophagus Stomach: There was no hiatal hernia. Gastric folds were unremarkable. Gastric antrum and pyloric outlet were normal. Retroflexion of the scope revealed normal fundus and cardia Duodenum: Duodenal bulb and descending duodenum were normal[ The scope was then withdrawn from the patient and the procedure completed.  COMPLICATIONS: There were no complications. ENDOSCOPIC IMPRESSION: normal upper endoscopy of esophagus stomach and duodenum. Followup biopsies from GE junction for [name]'s esophagus RECOMMENDATIONS: 1.  Await biopsy results 2.  Anti-reflux regimen to be follow 3.  Continue PPI 4. resume Plavix  REPEAT EXAM: for EGD pending biopsy results.  eSigned:  Lafayette Dragon, MD 08/15/2013 10:11 AM   CC:  PATIENT NAME:  [name], [name] MR#: [id number]

## 2013-08-15 NOTE — Progress Notes (Signed)
Procedure ends, to recovery awake, report given and VSS

## 2013-08-15 NOTE — Progress Notes (Signed)
Called to room to assist during endoscopic procedure.  Patient ID and intended procedure confirmed with present staff. Received instructions for my participation in the procedure from the performing physician.  

## 2013-08-18 ENCOUNTER — Telehealth: Payer: Self-pay

## 2013-08-18 NOTE — Telephone Encounter (Signed)
  Follow up Call-  Call back number 08/15/2013 06/29/2011  Post procedure Call Back phone  # [phone]  Permission to leave phone message Yes -     Patient questions:  Do you have a fever, pain , or abdominal swelling? no Pain Score  0 *  Have you tolerated food without any problems? yes  Have you been able to return to your normal activities? yes  Do you have any questions about your discharge instructions: Diet   no Medications  no Follow up visit  no  Do you have questions or concerns about your Care? no  Actions: * If pain score is 4 or above: No action needed, pain <4.   No problems per the pt. Maw

## 2013-08-20 ENCOUNTER — Encounter: Payer: Self-pay | Admitting: Internal Medicine

## 2013-09-08 ENCOUNTER — Other Ambulatory Visit: Payer: Self-pay | Admitting: *Deleted

## 2013-09-08 MED ORDER — LINACLOTIDE 145 MCG PO CAPS: 145.0000 ug | ORAL_CAPSULE | Freq: Every day | ORAL | Status: DC

## 2013-10-23 ENCOUNTER — Other Ambulatory Visit (HOSPITAL_COMMUNITY): Payer: Self-pay | Admitting: Family Medicine

## 2013-10-23 DIAGNOSIS — Z1231 Encounter for screening mammogram for malignant neoplasm of breast: Secondary | ICD-10-CM

## 2013-10-31 ENCOUNTER — Other Ambulatory Visit: Payer: Self-pay | Admitting: Nurse Practitioner

## 2013-11-05 ENCOUNTER — Ambulatory Visit (HOSPITAL_COMMUNITY)
Admission: RE | Admit: 2013-11-05 | Discharge: 2013-11-05 | Disposition: A | Discharge status: Home / Self Care | Payer: Medicare HMO | Source: Ambulatory Visit | Attending: Family Medicine | Admitting: Family Medicine

## 2013-11-05 DIAGNOSIS — Z1231 Encounter for screening mammogram for malignant neoplasm of breast: Secondary | ICD-10-CM | POA: Insufficient documentation

## 2013-12-02 ENCOUNTER — Encounter (HOSPITAL_COMMUNITY): Payer: Self-pay | Admitting: Pharmacy Technician

## 2013-12-04 ENCOUNTER — Other Ambulatory Visit: Payer: Self-pay | Admitting: Orthopedic Surgery

## 2013-12-09 ENCOUNTER — Encounter (HOSPITAL_COMMUNITY)
Admission: RE | Admit: 2013-12-09 | Discharge: 2013-12-09 | Disposition: A | Discharge status: Home / Self Care | Payer: Medicare HMO | Source: Ambulatory Visit | Attending: Orthopedic Surgery | Admitting: Orthopedic Surgery

## 2013-12-09 ENCOUNTER — Encounter (HOSPITAL_COMMUNITY): Payer: Self-pay

## 2013-12-09 DIAGNOSIS — Z01818 Encounter for other preprocedural examination: Secondary | ICD-10-CM | POA: Insufficient documentation

## 2013-12-09 DIAGNOSIS — Z01810 Encounter for preprocedural cardiovascular examination: Secondary | ICD-10-CM | POA: Insufficient documentation

## 2013-12-09 DIAGNOSIS — Z01812 Encounter for preprocedural laboratory examination: Secondary | ICD-10-CM | POA: Insufficient documentation

## 2013-12-09 HISTORY — DX: Cardiac murmur, unspecified: R01.1

## 2013-12-09 HISTORY — DX: Peripheral vascular disease, unspecified: I73.9

## 2013-12-09 HISTORY — DX: Gastro-esophageal reflux disease without esophagitis: K21.9

## 2013-12-09 HISTORY — DX: Angina pectoris, unspecified: I20.9

## 2013-12-09 LAB — CBC WITH DIFFERENTIAL/PLATELET
Basophils Absolute: 0 10*3/uL (ref 0.0–0.1)
Basophils Relative: 1 % (ref 0–1)
Eosinophils Absolute: 0.8 10*3/uL — ABNORMAL HIGH (ref 0.0–0.7)
Eosinophils Relative: 11 % — ABNORMAL HIGH (ref 0–5)
HCT: 44.2 % (ref 36.0–46.0)
Hemoglobin: 15.3 g/dL — ABNORMAL HIGH (ref 12.0–15.0)
Lymphocytes Relative: 39 % (ref 12–46)
Lymphs Abs: 2.8 10*3/uL (ref 0.7–4.0)
MCH: 32 pg (ref 26.0–34.0)
MCHC: 34.6 g/dL (ref 30.0–36.0)
MCV: 92.5 fL (ref 78.0–100.0)
Monocytes Absolute: 0.6 10*3/uL (ref 0.1–1.0)
Monocytes Relative: 9 % (ref 3–12)
Neutro Abs: 3 10*3/uL (ref 1.7–7.7)
Neutrophils Relative %: 40 % — ABNORMAL LOW (ref 43–77)
Platelets: 242 10*3/uL (ref 150–400)
RBC: 4.78 MIL/uL (ref 3.87–5.11)
RDW: 15.3 % (ref 11.5–15.5)
WBC: 7.3 10*3/uL (ref 4.0–10.5)

## 2013-12-09 LAB — COMPREHENSIVE METABOLIC PANEL
ALT: 14 U/L (ref 0–35)
AST: 23 U/L (ref 0–37)
Albumin: 3.7 g/dL (ref 3.5–5.2)
Alkaline Phosphatase: 66 U/L (ref 39–117)
BUN: 21 mg/dL (ref 6–23)
CO2: 24 mEq/L (ref 19–32)
Calcium: 9.3 mg/dL (ref 8.4–10.5)
Chloride: 99 mEq/L (ref 96–112)
Creatinine, Ser: 2.37 mg/dL — ABNORMAL HIGH (ref 0.50–1.10)
GFR calc Af Amer: 23 mL/min — ABNORMAL LOW (ref >90)
GFR calc non Af Amer: 20 mL/min — ABNORMAL LOW (ref >90)
Glucose, Bld: 151 mg/dL — ABNORMAL HIGH (ref 70–99)
Potassium: 3 mEq/L — ABNORMAL LOW (ref 3.7–5.3)
Sodium: 141 mEq/L (ref 137–147)
Total Bilirubin: 0.3 mg/dL (ref 0.3–1.2)
Total Protein: 7.5 g/dL (ref 6.0–8.3)

## 2013-12-09 LAB — PROTIME-INR
INR: 0.96 (ref 0.00–1.49)
Prothrombin Time: 12.6 seconds (ref 11.6–15.2)

## 2013-12-09 LAB — TYPE AND SCREEN
ABO/RH(D): A POS
Antibody Screen: NEGATIVE

## 2013-12-09 LAB — URINE MICROSCOPIC-ADD ON

## 2013-12-09 LAB — URINALYSIS, ROUTINE W REFLEX MICROSCOPIC
Bilirubin Urine: NEGATIVE
Glucose, UA: NEGATIVE mg/dL
Hgb urine dipstick: NEGATIVE
Ketones, ur: NEGATIVE mg/dL
Nitrite: NEGATIVE
Protein, ur: NEGATIVE mg/dL
Specific Gravity, Urine: 1.017 (ref 1.005–1.030)
Urobilinogen, UA: 1 mg/dL (ref 0.0–1.0)
pH: 6 (ref 5.0–8.0)

## 2013-12-09 LAB — SURGICAL PCR SCREEN
MRSA, PCR: NEGATIVE
Staphylococcus aureus: NEGATIVE

## 2013-12-09 LAB — APTT: aPTT: 26 seconds (ref 24–37)

## 2013-12-09 LAB — ABO/RH: ABO/RH(D): A POS

## 2013-12-09 NOTE — Pre-Procedure Instructions (Addendum)
[name]  12/09/2013   Your procedure is scheduled on:  12/15/13  Report to [facility] short stay admitting at 1015 AM.  Call this number if you have problems the morning of surgery: [phone]   Remember:   Do not eat food or drink liquids after midnight.   Take these medicines the morning of surgery with A SIP OF WATER: inhaler,nitro if needed, amlodipine, carvedilol. Clonidine, nexium,uloric         STOP all herbel meds, nsaids (aleve,naproxen,advil,ibuprofen) 5 days prior to surgery including linzess,   Aspirin and plavix per dr orders   Do not wear jewelry, make-up or nail polish.  Do not wear lotions, powders, or perfumes. You may wear deodorant.  Do not shave 48 hours prior to surgery. Men may shave face and neck.  Do not bring valuables to the hospital.  [facility] is not responsible                  for any belongings or valuables.               Contacts, dentures or bridgework may not be worn into surgery.  Leave suitcase in the car. After surgery it may be brought to your room.  For patients admitted to the hospital, discharge time is determined by your                treatment team.               Patients discharged the day of surgery will not be allowed to drive  home.  Name and phone number of your driver:   Special Instructions:  Special Instructions: [place] - Preparing for Surgery  Before surgery, you can play an important role.  Because skin is not sterile, your skin needs to be as free of germs as possible.  You can reduce the number of germs on you skin by washing with CHG (chlorahexidine gluconate) soap before surgery.  CHG is an antiseptic cleaner which kills germs and bonds with the skin to continue killing germs even after washing.  Please DO NOT use if you have an allergy to CHG or antibacterial soaps.  If your skin becomes reddened/irritated stop using the CHG and inform your nurse when you arrive at Short Stay.  Do not shave (including legs and  underarms) for at least 48 hours prior to the first CHG shower.  You may shave your face.  Please follow these instructions carefully:   1.  Shower with CHG Soap the night before surgery and the morning of Surgery.  2.  If you choose to wash your hair, wash your hair first as usual with your normal shampoo.  3.  After you shampoo, rinse your hair and body thoroughly to remove the Shampoo.  4.  Use CHG as you would any other liquid soap.  You can apply chg directly  to the skin and wash gently with scrungie or a clean washcloth.  5.  Apply the CHG Soap to your body ONLY FROM THE NECK DOWN.  Do not use on open wounds or open sores.  Avoid contact with your eyes ears, mouth and genitals (private parts).  Wash genitals (private parts)       with your normal soap.  6.  Wash thoroughly, paying special attention to the area where your surgery will be performed.  7.  Thoroughly rinse your body with warm water from the neck down.  8.  DO NOT shower/wash  with your normal soap after using and rinsing off the CHG Soap.  9.  Pat yourself dry with a clean towel.            10.  Wear clean pajamas.            11.  Place clean sheets on your bed the night of your first shower and do not sleep with pets.  Day of Surgery  Do not apply any lotions/deodorants the morning of surgery.  Please wear clean clothes to the hospital/surgery center.   Please read over the following fact sheets that you were given: Pain Booklet, Coughing and Deep Breathing, Blood Transfusion Information, Total Joint Packet, MRSA Information and Surgical Site Infection Prevention

## 2013-12-09 NOTE — Progress Notes (Signed)
req'd notes, any tsts recent from dr Einar Gip April at office to call patient re: d/c aspirin, plavix

## 2013-12-10 ENCOUNTER — Encounter (HOSPITAL_COMMUNITY): Payer: Self-pay

## 2013-12-10 NOTE — Progress Notes (Addendum)
Anesthesia Chart Review:  Patient is a 68 year old female scheduled for right TKA on 12/15/13 by Dr. Berenice Primas.   History includes non-smoker, aflutter s/p ablation and cardioversion '08, CAD, MI '09 s/p proximal and mid CX DES, HTN, DM2, diastolic CHF, heart murmur, PAD/claudication (bilateral ABI 0.79 on 11/03/13), esophageal dysmotility, GERD, arthritis, CKD stage IV (Dr. Erling Cruz), right breast lumpectomy, OSA with CPAP, fatty liver, HLD, asthma, history of chest pain with non-ischemic Lexiscan 09/2012. BMI is 41.5 consistent with morbid obesity. PCP is listed as Dr. [name]. Dr. Florene Glen is aware of plans for surgery. Cardiologist is Dr. Einar Gip.  He saw her on 11/11/13 for follow-up.  According to his note, she had had occasional non-radiating chest pain that did not require Nitro.  He felt there was no contraindication for planned knee surgery.  According to PAT RN notes, April at office will contact patient regarding when to hold ASA and Plavix.   EKG on 12/09/13 showed SB at 61 bpm, frequent PACs, T wave abnormality, consider lateral ischemia. Overall, I think her EKG is stable.   Echo on 10/03/12 [facility]) showed: LV cavity is normal in size, normal LV systolic function, calculated EF [id number], grade I diastolic dysfunction, mild AV thickening, mild mitral annulus calcification, trace MR. Mitral valve inflow A > E ratio.  Nuclear stress test on 09/27/12 ([facility]) showed no evidence of ischemia or scar, dynamic gated images reveal normal wall motion and endocardial thickening. LVEF 59%.  Cardiac cath on 06/01/08 showed: 1. Severe 2-vessel coronary artery disease with total occlusion of the right coronary dependent on collaterals from the LAD and circumflex. There is also high-grade obstruction with at least 90% proximal stenosis in the circumflex. There is moderate disease in the second obtuse marginal branch. The LAD is widely patent with the exception of systolic compression of the midvessel and  80% in the apical LAD. A small ramus intermedius branch contains 90% ostial stenosis.  2. Normal LV function.  3. Successful stenting of the circumflex with a Promus drug-eluting  stent to 3.25-mm diameter with TIMI grade 3 flow noted.  CXR on 12/09/13 showed no acute cardiopulmonary disease.  Preoperative labs noted. K+ 3.0.  Cr 2.37, BUN 21. (last BUN 16/Cr 2.35 at [facility] on 11/26/13) H/H 15.3/44.2.  PT/PTT WNL. Glucose 151.  T&S done. She has KCL at home, but reportedly it has been on hold, presumable due to her CKD.   I left a voice mail with Manuela Schwartz at Dr. Berenice Primas' office regarding K+, BUN/Cr results, so Dr. Berenice Primas can instruct her on KCL supplementation as felt indicated.    Her renal function appears stable.  She has been cleared by cardiology.  If no acute changes then I would anticipate that she could proceed as planned.  [facility] Short Stay Center/Anesthesiology Phone [phone] 12/10/2013 2:33 PM  Addendum: 12/11/2013 4:22 PM Per Manuela Schwartz, Dr. Berenice Primas is having her take KCL 10 mEq 2 daily (total 10 tabs) with plans to repeat BMET on the day of surgery. They have also placed a call to Dr. Abel Presto office to ensure that he has no additional recommendations regarding her CKD and surgery.

## 2013-12-14 MED ORDER — CLINDAMYCIN PHOSPHATE 900 MG/50ML IV SOLN
900.0000 mg | INTRAVENOUS | Status: AC
  Administered 2013-12-15: 900 mg via INTRAVENOUS
  Filled 2013-12-14: qty 50

## 2013-12-15 ENCOUNTER — Encounter (HOSPITAL_COMMUNITY): Payer: Medicare HMO | Admitting: Vascular Surgery

## 2013-12-15 ENCOUNTER — Inpatient Hospital Stay (HOSPITAL_COMMUNITY)
Admission: RE | Admit: 2013-12-15 | Discharge: 2013-12-23 | DRG: 469 | Disposition: A | Discharge status: Home / Self Care | Payer: Medicare HMO | Source: Ambulatory Visit | Attending: Internal Medicine | Admitting: Internal Medicine

## 2013-12-15 ENCOUNTER — Encounter (HOSPITAL_COMMUNITY)
Admission: RE | Disposition: A | Discharge status: Home / Self Care | Payer: Self-pay | Source: Ambulatory Visit | Attending: Orthopedic Surgery

## 2013-12-15 ENCOUNTER — Inpatient Hospital Stay (HOSPITAL_COMMUNITY): Payer: Medicare HMO | Admitting: Critical Care Medicine

## 2013-12-15 ENCOUNTER — Encounter (HOSPITAL_COMMUNITY): Payer: Self-pay | Admitting: Critical Care Medicine

## 2013-12-15 DIAGNOSIS — I5031 Acute diastolic (congestive) heart failure: Secondary | ICD-10-CM

## 2013-12-15 DIAGNOSIS — Z6841 Body Mass Index (BMI) 40.0 and over, adult: Secondary | ICD-10-CM

## 2013-12-15 DIAGNOSIS — I2510 Atherosclerotic heart disease of native coronary artery without angina pectoris: Secondary | ICD-10-CM | POA: Diagnosis present

## 2013-12-15 DIAGNOSIS — N185 Chronic kidney disease, stage 5: Secondary | ICD-10-CM | POA: Diagnosis present

## 2013-12-15 DIAGNOSIS — Z7982 Long term (current) use of aspirin: Secondary | ICD-10-CM

## 2013-12-15 DIAGNOSIS — I509 Heart failure, unspecified: Secondary | ICD-10-CM | POA: Diagnosis present

## 2013-12-15 DIAGNOSIS — I5033 Acute on chronic diastolic (congestive) heart failure: Secondary | ICD-10-CM

## 2013-12-15 DIAGNOSIS — I739 Peripheral vascular disease, unspecified: Secondary | ICD-10-CM | POA: Diagnosis present

## 2013-12-15 DIAGNOSIS — Z794 Long term (current) use of insulin: Secondary | ICD-10-CM

## 2013-12-15 DIAGNOSIS — R509 Fever, unspecified: Secondary | ICD-10-CM | POA: Diagnosis not present

## 2013-12-15 DIAGNOSIS — I4891 Unspecified atrial fibrillation: Secondary | ICD-10-CM | POA: Diagnosis present

## 2013-12-15 DIAGNOSIS — M1712 Unilateral primary osteoarthritis, left knee: Secondary | ICD-10-CM

## 2013-12-15 DIAGNOSIS — R1032 Left lower quadrant pain: Secondary | ICD-10-CM

## 2013-12-15 DIAGNOSIS — J95821 Acute postprocedural respiratory failure: Secondary | ICD-10-CM | POA: Diagnosis not present

## 2013-12-15 DIAGNOSIS — K5900 Constipation, unspecified: Secondary | ICD-10-CM

## 2013-12-15 DIAGNOSIS — R0602 Shortness of breath: Secondary | ICD-10-CM

## 2013-12-15 DIAGNOSIS — N184 Chronic kidney disease, stage 4 (severe): Secondary | ICD-10-CM

## 2013-12-15 DIAGNOSIS — I4892 Unspecified atrial flutter: Secondary | ICD-10-CM | POA: Diagnosis present

## 2013-12-15 DIAGNOSIS — M1711 Unilateral primary osteoarthritis, right knee: Secondary | ICD-10-CM

## 2013-12-15 DIAGNOSIS — K2270 Barrett's esophagus without dysplasia: Secondary | ICD-10-CM

## 2013-12-15 DIAGNOSIS — J9600 Acute respiratory failure, unspecified whether with hypoxia or hypercapnia: Secondary | ICD-10-CM

## 2013-12-15 DIAGNOSIS — I129 Hypertensive chronic kidney disease with stage 1 through stage 4 chronic kidney disease, or unspecified chronic kidney disease: Secondary | ICD-10-CM | POA: Diagnosis present

## 2013-12-15 DIAGNOSIS — N179 Acute kidney failure, unspecified: Secondary | ICD-10-CM | POA: Diagnosis not present

## 2013-12-15 DIAGNOSIS — D179 Benign lipomatous neoplasm, unspecified: Secondary | ICD-10-CM

## 2013-12-15 DIAGNOSIS — E1129 Type 2 diabetes mellitus with other diabetic kidney complication: Secondary | ICD-10-CM | POA: Diagnosis present

## 2013-12-15 DIAGNOSIS — G4733 Obstructive sleep apnea (adult) (pediatric): Secondary | ICD-10-CM

## 2013-12-15 DIAGNOSIS — E119 Type 2 diabetes mellitus without complications: Secondary | ICD-10-CM

## 2013-12-15 DIAGNOSIS — M1710 Unilateral primary osteoarthritis, unspecified knee: Principal | ICD-10-CM | POA: Diagnosis present

## 2013-12-15 DIAGNOSIS — J45909 Unspecified asthma, uncomplicated: Secondary | ICD-10-CM | POA: Diagnosis present

## 2013-12-15 DIAGNOSIS — I252 Old myocardial infarction: Secondary | ICD-10-CM

## 2013-12-15 DIAGNOSIS — K219 Gastro-esophageal reflux disease without esophagitis: Secondary | ICD-10-CM

## 2013-12-15 DIAGNOSIS — E785 Hyperlipidemia, unspecified: Secondary | ICD-10-CM | POA: Diagnosis present

## 2013-12-15 DIAGNOSIS — E876 Hypokalemia: Secondary | ICD-10-CM | POA: Diagnosis not present

## 2013-12-15 HISTORY — PX: TOTAL KNEE ARTHROPLASTY: SHX125

## 2013-12-15 LAB — COMPREHENSIVE METABOLIC PANEL
ALT: 12 U/L (ref 0–35)
AST: 20 U/L (ref 0–37)
Albumin: 3.7 g/dL (ref 3.5–5.2)
Alkaline Phosphatase: 60 U/L (ref 39–117)
BUN: 20 mg/dL (ref 6–23)
CO2: 22 mEq/L (ref 19–32)
Calcium: 9.4 mg/dL (ref 8.4–10.5)
Chloride: 105 mEq/L (ref 96–112)
Creatinine, Ser: 2.07 mg/dL — ABNORMAL HIGH (ref 0.50–1.10)
GFR calc Af Amer: 27 mL/min — ABNORMAL LOW (ref >90)
GFR calc non Af Amer: 24 mL/min — ABNORMAL LOW (ref >90)
Glucose, Bld: 143 mg/dL — ABNORMAL HIGH (ref 70–99)
Potassium: 3.4 mEq/L — ABNORMAL LOW (ref 3.7–5.3)
Sodium: 145 mEq/L (ref 137–147)
Total Bilirubin: 0.4 mg/dL (ref 0.3–1.2)
Total Protein: 7.4 g/dL (ref 6.0–8.3)

## 2013-12-15 LAB — GLUCOSE, CAPILLARY
Glucose-Capillary: 131 mg/dL — ABNORMAL HIGH (ref 70–99)
Glucose-Capillary: 120 mg/dL — ABNORMAL HIGH (ref 70–99)
Glucose-Capillary: 135 mg/dL — ABNORMAL HIGH (ref 70–99)

## 2013-12-15 SURGERY — ARTHROPLASTY, KNEE, TOTAL
Anesthesia: Regional | Site: Knee | Laterality: Right

## 2013-12-15 MED ORDER — ACETAMINOPHEN 325 MG PO TABS: 325.0000 mg | ORAL_TABLET | ORAL | Status: DC | PRN

## 2013-12-15 MED ORDER — LIDOCAINE HCL (CARDIAC) 20 MG/ML IV SOLN
INTRAVENOUS | Status: AC
  Filled 2013-12-15: qty 5

## 2013-12-15 MED ORDER — ALBUTEROL SULFATE (2.5 MG/3ML) 0.083% IN NEBU: 2.5000 mg | INHALATION_SOLUTION | RESPIRATORY_TRACT | Status: DC | PRN

## 2013-12-15 MED ORDER — FENTANYL CITRATE 0.05 MG/ML IJ SOLN
INTRAMUSCULAR | Status: AC
Start: 2013-12-15 — End: 2013-12-15
  Filled 2013-12-15: qty 5

## 2013-12-15 MED ORDER — SUCCINYLCHOLINE CHLORIDE 20 MG/ML IJ SOLN
INTRAMUSCULAR | Status: DC | PRN
  Administered 2013-12-15: 100 mg via INTRAVENOUS

## 2013-12-15 MED ORDER — DIPHENHYDRAMINE HCL 12.5 MG/5ML PO ELIX
12.5000 mg | ORAL_SOLUTION | ORAL | Status: DC | PRN
  Filled 2013-12-15: qty 10

## 2013-12-15 MED ORDER — LIDOCAINE HCL (CARDIAC) 20 MG/ML IV SOLN
INTRAVENOUS | Status: DC | PRN
  Administered 2013-12-15: 20 mg via INTRAVENOUS
  Administered 2013-12-15: 80 mg via INTRAVENOUS

## 2013-12-15 MED ORDER — ALUM & MAG HYDROXIDE-SIMETH 200-200-20 MG/5ML PO SUSP: 30.0000 mL | ORAL | Status: DC | PRN

## 2013-12-15 MED ORDER — HYDROMORPHONE HCL PF 1 MG/ML IJ SOLN
1.0000 mg | INTRAMUSCULAR | Status: DC | PRN
  Administered 2013-12-15 – 2013-12-20 (×4): 1 mg via INTRAVENOUS
  Filled 2013-12-15 (×4): qty 1

## 2013-12-15 MED ORDER — MIDAZOLAM HCL 2 MG/2ML IJ SOLN
INTRAMUSCULAR | Status: AC
  Administered 2013-12-15: 1 mg
  Filled 2013-12-15: qty 2

## 2013-12-15 MED ORDER — PHENYLEPHRINE 40 MCG/ML (10ML) SYRINGE FOR IV PUSH (FOR BLOOD PRESSURE SUPPORT)
PREFILLED_SYRINGE | INTRAVENOUS | Status: AC
  Filled 2013-12-15: qty 10

## 2013-12-15 MED ORDER — DOCUSATE SODIUM 100 MG PO CAPS
100.0000 mg | ORAL_CAPSULE | Freq: Two times a day (BID) | ORAL | Status: DC
  Administered 2013-12-15 – 2013-12-20 (×10): 100 mg via ORAL
  Filled 2013-12-15 (×11): qty 1

## 2013-12-15 MED ORDER — NITROGLYCERIN 0.4 MG SL SUBL: 0.4000 mg | SUBLINGUAL_TABLET | SUBLINGUAL | Status: DC | PRN

## 2013-12-15 MED ORDER — SODIUM CHLORIDE 0.9 % IJ SOLN
INTRAMUSCULAR | Status: AC
  Filled 2013-12-15: qty 10

## 2013-12-15 MED ORDER — PHENYLEPHRINE HCL 10 MG/ML IJ SOLN
INTRAMUSCULAR | Status: DC | PRN
  Administered 2013-12-15: 40 ug via INTRAVENOUS
  Administered 2013-12-15: 80 ug via INTRAVENOUS

## 2013-12-15 MED ORDER — POLYETHYLENE GLYCOL 3350 17 G PO PACK
17.0000 g | PACK | Freq: Every day | ORAL | Status: DC | PRN
  Administered 2013-12-20: 17 g via ORAL
  Filled 2013-12-15: qty 1

## 2013-12-15 MED ORDER — DEXTROSE 5 % IV SOLN
10.0000 mg | INTRAVENOUS | Status: DC | PRN
  Administered 2013-12-15: 25 ug/min via INTRAVENOUS

## 2013-12-15 MED ORDER — MIDAZOLAM HCL 5 MG/ML IJ SOLN: 1.0000 mg | Freq: Once | INTRAMUSCULAR | Status: DC

## 2013-12-15 MED ORDER — PANTOPRAZOLE SODIUM 40 MG PO TBEC
40.0000 mg | DELAYED_RELEASE_TABLET | Freq: Every day | ORAL | Status: DC
  Administered 2013-12-16 – 2013-12-23 (×8): 40 mg via ORAL
  Filled 2013-12-15 (×8): qty 1

## 2013-12-15 MED ORDER — CEFUROXIME SODIUM 1.5 G IJ SOLR
INTRAMUSCULAR | Status: AC
  Filled 2013-12-15: qty 1.5

## 2013-12-15 MED ORDER — ONDANSETRON HCL 4 MG/2ML IJ SOLN
INTRAMUSCULAR | Status: DC | PRN
  Administered 2013-12-15: 4 mg via INTRAVENOUS

## 2013-12-15 MED ORDER — ONDANSETRON HCL 4 MG PO TABS
4.0000 mg | ORAL_TABLET | Freq: Four times a day (QID) | ORAL | Status: DC | PRN
Start: 2013-12-15 — End: 2013-12-23

## 2013-12-15 MED ORDER — ACETAMINOPHEN 325 MG PO TABS: 650.0000 mg | ORAL_TABLET | Freq: Four times a day (QID) | ORAL | Status: DC | PRN

## 2013-12-15 MED ORDER — MIDAZOLAM HCL 2 MG/2ML IJ SOLN
INTRAMUSCULAR | Status: AC
  Filled 2013-12-15: qty 2

## 2013-12-15 MED ORDER — FENTANYL CITRATE 0.05 MG/ML IJ SOLN
INTRAMUSCULAR | Status: DC | PRN
  Administered 2013-12-15 (×5): 50 ug via INTRAVENOUS

## 2013-12-15 MED ORDER — POTASSIUM CHLORIDE CRYS ER 20 MEQ PO TBCR
20.0000 meq | EXTENDED_RELEASE_TABLET | Freq: Every day | ORAL | Status: DC
  Administered 2013-12-15: 20 meq via ORAL
  Filled 2013-12-15 (×2): qty 1

## 2013-12-15 MED ORDER — ATORVASTATIN CALCIUM 20 MG PO TABS
20.0000 mg | ORAL_TABLET | Freq: Every day | ORAL | Status: DC
  Administered 2013-12-15 – 2013-12-22 (×7): 20 mg via ORAL
  Filled 2013-12-15 (×10): qty 1

## 2013-12-15 MED ORDER — OXYCODONE HCL 5 MG PO TABS
5.0000 mg | ORAL_TABLET | Freq: Once | ORAL | Status: AC | PRN
  Administered 2013-12-15: 5 mg via ORAL

## 2013-12-15 MED ORDER — PROPOFOL 10 MG/ML IV BOLUS
INTRAVENOUS | Status: AC
  Filled 2013-12-15: qty 20

## 2013-12-15 MED ORDER — INSULIN ASPART 100 UNIT/ML ~~LOC~~ SOLN
0.0000 [IU] | Freq: Three times a day (TID) | SUBCUTANEOUS | Status: DC
  Administered 2013-12-16 (×2): 5 [IU] via SUBCUTANEOUS
  Administered 2013-12-17 (×2): 3 [IU] via SUBCUTANEOUS
  Administered 2013-12-17 – 2013-12-18 (×2): 2 [IU] via SUBCUTANEOUS
  Administered 2013-12-18: 3 [IU] via SUBCUTANEOUS

## 2013-12-15 MED ORDER — OXYCODONE HCL 5 MG PO TABS
ORAL_TABLET | ORAL | Status: AC
  Filled 2013-12-15: qty 1

## 2013-12-15 MED ORDER — ONDANSETRON HCL 4 MG/2ML IJ SOLN: 4.0000 mg | Freq: Once | INTRAMUSCULAR | Status: DC | PRN

## 2013-12-15 MED ORDER — DEXTROSE 5 % IV SOLN
500.0000 mg | Freq: Four times a day (QID) | INTRAVENOUS | Status: DC | PRN
  Filled 2013-12-15 (×2): qty 5

## 2013-12-15 MED ORDER — BISACODYL 5 MG PO TBEC: 5.0000 mg | DELAYED_RELEASE_TABLET | Freq: Every day | ORAL | Status: DC | PRN

## 2013-12-15 MED ORDER — SODIUM CHLORIDE 0.9 % IJ SOLN
INTRAMUSCULAR | Status: DC | PRN
  Administered 2013-12-15: 40 mL

## 2013-12-15 MED ORDER — ROPIVACAINE HCL 5 MG/ML IJ SOLN
INTRAMUSCULAR | Status: DC | PRN
  Administered 2013-12-15: 20 mL via PERINEURAL

## 2013-12-15 MED ORDER — EPHEDRINE SULFATE 50 MG/ML IJ SOLN
INTRAMUSCULAR | Status: DC | PRN
  Administered 2013-12-15: 10 mg via INTRAVENOUS
  Administered 2013-12-15 (×3): 5 mg via INTRAVENOUS

## 2013-12-15 MED ORDER — ZOLPIDEM TARTRATE 5 MG PO TABS
5.0000 mg | ORAL_TABLET | Freq: Every evening | ORAL | Status: DC | PRN
  Administered 2013-12-15 – 2013-12-21 (×5): 5 mg via ORAL
  Filled 2013-12-15 (×5): qty 1

## 2013-12-15 MED ORDER — OXYCODONE-ACETAMINOPHEN 5-325 MG PO TABS
1.0000 | ORAL_TABLET | ORAL | Status: DC | PRN
  Administered 2013-12-15 – 2013-12-23 (×25): 2 via ORAL
  Filled 2013-12-15 (×26): qty 2

## 2013-12-15 MED ORDER — 0.9 % SODIUM CHLORIDE (POUR BTL) OPTIME
TOPICAL | Status: DC | PRN
  Administered 2013-12-15: 1000 mL

## 2013-12-15 MED ORDER — EPHEDRINE SULFATE 50 MG/ML IJ SOLN
INTRAMUSCULAR | Status: AC
  Filled 2013-12-15: qty 1

## 2013-12-15 MED ORDER — CHLORHEXIDINE GLUCONATE 4 % EX LIQD: 60.0000 mL | Freq: Once | CUTANEOUS | Status: DC

## 2013-12-15 MED ORDER — FENTANYL CITRATE 0.05 MG/ML IJ SOLN
25.0000 ug | INTRAMUSCULAR | Status: DC | PRN
  Administered 2013-12-15 (×3): 50 ug via INTRAVENOUS

## 2013-12-15 MED ORDER — FENTANYL CITRATE 0.05 MG/ML IJ SOLN: 50.0000 ug | Freq: Once | INTRAMUSCULAR | Status: DC

## 2013-12-15 MED ORDER — FENTANYL CITRATE 0.05 MG/ML IJ SOLN
INTRAMUSCULAR | Status: AC
  Filled 2013-12-15: qty 2

## 2013-12-15 MED ORDER — INSULIN GLARGINE 100 UNIT/ML ~~LOC~~ SOLN
30.0000 [IU] | Freq: Two times a day (BID) | SUBCUTANEOUS | Status: DC
  Administered 2013-12-15 – 2013-12-18 (×6): 30 [IU] via SUBCUTANEOUS
  Filled 2013-12-15 (×7): qty 0.3

## 2013-12-15 MED ORDER — CLOPIDOGREL BISULFATE 75 MG PO TABS
75.0000 mg | ORAL_TABLET | Freq: Every day | ORAL | Status: DC
  Administered 2013-12-16 – 2013-12-23 (×8): 75 mg via ORAL
  Filled 2013-12-15 (×11): qty 1

## 2013-12-15 MED ORDER — FENTANYL CITRATE 0.05 MG/ML IJ SOLN
INTRAMUSCULAR | Status: AC
  Administered 2013-12-15: 50 ug
  Filled 2013-12-15: qty 2

## 2013-12-15 MED ORDER — CLONIDINE HCL 0.3 MG PO TABS
0.3000 mg | ORAL_TABLET | Freq: Two times a day (BID) | ORAL | Status: DC
  Administered 2013-12-15 – 2013-12-17 (×5): 0.3 mg via ORAL
  Filled 2013-12-15 (×7): qty 1

## 2013-12-15 MED ORDER — CLINDAMYCIN PHOSPHATE 600 MG/50ML IV SOLN
600.0000 mg | Freq: Four times a day (QID) | INTRAVENOUS | Status: AC
  Administered 2013-12-15 – 2013-12-16 (×2): 600 mg via INTRAVENOUS
  Filled 2013-12-15 (×2): qty 50

## 2013-12-15 MED ORDER — SODIUM CHLORIDE 0.9 % IR SOLN
Status: DC | PRN
  Administered 2013-12-15: 3000 mL

## 2013-12-15 MED ORDER — FENTANYL CITRATE 0.05 MG/ML IJ SOLN
INTRAMUSCULAR | Status: AC
  Administered 2013-12-15: 50 ug via INTRAVENOUS
  Filled 2013-12-15: qty 2

## 2013-12-15 MED ORDER — AMLODIPINE BESYLATE 10 MG PO TABS
10.0000 mg | ORAL_TABLET | Freq: Every day | ORAL | Status: DC
  Administered 2013-12-16: 10 mg via ORAL
  Filled 2013-12-15 (×2): qty 1

## 2013-12-15 MED ORDER — PROPOFOL 10 MG/ML IV BOLUS
INTRAVENOUS | Status: DC | PRN
  Administered 2013-12-15: 110 mg via INTRAVENOUS

## 2013-12-15 MED ORDER — SODIUM CHLORIDE 0.9 % IV SOLN
INTRAVENOUS | Status: DC
  Administered 2013-12-15: 100 mL/h via INTRAVENOUS
  Administered 2013-12-16: 03:00:00 via INTRAVENOUS

## 2013-12-15 MED ORDER — ACETAMINOPHEN 650 MG RE SUPP: 650.0000 mg | Freq: Four times a day (QID) | RECTAL | Status: DC | PRN

## 2013-12-15 MED ORDER — AMITRIPTYLINE HCL 25 MG PO TABS
25.0000 mg | ORAL_TABLET | Freq: Every day | ORAL | Status: DC
  Administered 2013-12-15 – 2013-12-22 (×7): 25 mg via ORAL
  Filled 2013-12-15 (×9): qty 1

## 2013-12-15 MED ORDER — BUPIVACAINE LIPOSOME 1.3 % IJ SUSP
20.0000 mL | INTRAMUSCULAR | Status: AC
  Administered 2013-12-15: 20 mL
  Filled 2013-12-15: qty 20

## 2013-12-15 MED ORDER — ALBUTEROL SULFATE (2.5 MG/3ML) 0.083% IN NEBU
2.5000 mg | INHALATION_SOLUTION | Freq: Four times a day (QID) | RESPIRATORY_TRACT | Status: DC
  Administered 2013-12-15 – 2013-12-17 (×8): 2.5 mg via RESPIRATORY_TRACT
  Filled 2013-12-15 (×9): qty 3

## 2013-12-15 MED ORDER — MIDAZOLAM HCL 5 MG/5ML IJ SOLN
INTRAMUSCULAR | Status: DC | PRN
  Administered 2013-12-15: 1 mg via INTRAVENOUS

## 2013-12-15 MED ORDER — LINACLOTIDE 290 MCG PO CAPS
290.0000 ug | ORAL_CAPSULE | Freq: Every day | ORAL | Status: DC
  Administered 2013-12-16 – 2013-12-23 (×7): 290 ug via ORAL
  Filled 2013-12-15 (×8): qty 1

## 2013-12-15 MED ORDER — FEBUXOSTAT 40 MG PO TABS
40.0000 mg | ORAL_TABLET | Freq: Every day | ORAL | Status: DC
  Administered 2013-12-16 – 2013-12-23 (×8): 40 mg via ORAL
  Filled 2013-12-15 (×8): qty 1

## 2013-12-15 MED ORDER — PROMETHAZINE HCL 25 MG/ML IJ SOLN: 12.5000 mg | Freq: Four times a day (QID) | INTRAMUSCULAR | Status: DC | PRN

## 2013-12-15 MED ORDER — CARVEDILOL 25 MG PO TABS
37.5000 mg | ORAL_TABLET | Freq: Two times a day (BID) | ORAL | Status: DC
  Administered 2013-12-15 – 2013-12-17 (×5): 37.5 mg via ORAL
  Filled 2013-12-15 (×9): qty 1

## 2013-12-15 MED ORDER — METHOCARBAMOL 500 MG PO TABS
500.0000 mg | ORAL_TABLET | Freq: Four times a day (QID) | ORAL | Status: DC | PRN
  Administered 2013-12-15 – 2013-12-23 (×5): 500 mg via ORAL
  Filled 2013-12-15 (×6): qty 1

## 2013-12-15 MED ORDER — TRANEXAMIC ACID 100 MG/ML IV SOLN
1000.0000 mg | INTRAVENOUS | Status: DC
  Filled 2013-12-15: qty 10

## 2013-12-15 MED ORDER — TRANEXAMIC ACID 100 MG/ML IV SOLN
1000.0000 mg | INTRAVENOUS | Status: DC | PRN
  Administered 2013-12-15: 1000 mg via INTRAVENOUS

## 2013-12-15 MED ORDER — ACETAMINOPHEN 160 MG/5ML PO SOLN
325.0000 mg | ORAL | Status: DC | PRN
  Filled 2013-12-15: qty 20.3

## 2013-12-15 MED ORDER — ONDANSETRON HCL 4 MG/2ML IJ SOLN
INTRAMUSCULAR | Status: AC
  Filled 2013-12-15: qty 2

## 2013-12-15 MED ORDER — ONDANSETRON HCL 4 MG/2ML IJ SOLN: 4.0000 mg | Freq: Four times a day (QID) | INTRAMUSCULAR | Status: DC | PRN

## 2013-12-15 MED ORDER — FUROSEMIDE 80 MG PO TABS
160.0000 mg | ORAL_TABLET | Freq: Three times a day (TID) | ORAL | Status: DC
  Administered 2013-12-15 – 2013-12-18 (×9): 160 mg via ORAL
  Filled 2013-12-15 (×11): qty 2

## 2013-12-15 MED ORDER — OXYCODONE HCL 5 MG/5ML PO SOLN: 5.0000 mg | Freq: Once | ORAL | Status: AC | PRN

## 2013-12-15 MED ORDER — SODIUM CHLORIDE 0.9 % IV SOLN
INTRAVENOUS | Status: DC
  Administered 2013-12-15 (×2): via INTRAVENOUS

## 2013-12-15 MED ORDER — DICYCLOMINE HCL 10 MG PO CAPS
10.0000 mg | ORAL_CAPSULE | Freq: Four times a day (QID) | ORAL | Status: DC | PRN
  Filled 2013-12-15: qty 1

## 2013-12-15 SURGICAL SUPPLY — 65 items
APL SKNCLS STERI-STRIP NONHPOA (GAUZE/BANDAGES/DRESSINGS) ×1
BANDAGE ELASTIC 4 VELCRO ST LF (GAUZE/BANDAGES/DRESSINGS) ×1 IMPLANT
BANDAGE ELASTIC 6 VELCRO ST LF (GAUZE/BANDAGES/DRESSINGS) ×1 IMPLANT
BANDAGE ESMARK 6X9 LF (GAUZE/BANDAGES/DRESSINGS) ×1 IMPLANT
BENZOIN TINCTURE PRP APPL 2/3 (GAUZE/BANDAGES/DRESSINGS) ×2 IMPLANT
BLADE SAGITTAL 25.0X1.19X90 (BLADE) ×2 IMPLANT
BLADE SAW SAG 90X13X1.27 (BLADE) ×2 IMPLANT
BNDG CMPR 9X6 STRL LF SNTH (GAUZE/BANDAGES/DRESSINGS) ×1
BNDG ESMARK 6X9 LF (GAUZE/BANDAGES/DRESSINGS) ×2
BOWL SMART MIX CTS (DISPOSABLE) ×2 IMPLANT
CAPT RP KNEE ×1 IMPLANT
CEMENT HV SMART SET (Cement) ×4 IMPLANT
COVER SURGICAL LIGHT HANDLE (MISCELLANEOUS) ×2 IMPLANT
CUFF TOURNIQUET SINGLE 34IN LL (TOURNIQUET CUFF) ×2 IMPLANT
CUFF TOURNIQUET SINGLE 44IN (TOURNIQUET CUFF) IMPLANT
DRAPE EXTREMITY T 121X128X90 (DRAPE) ×2 IMPLANT
DRAPE U-SHAPE 47X51 STRL (DRAPES) ×2 IMPLANT
DRSG PAD ABDOMINAL 8X10 ST (GAUZE/BANDAGES/DRESSINGS) ×2 IMPLANT
DURAPREP 26ML APPLICATOR (WOUND CARE) ×2 IMPLANT
ELECT REM PT RETURN 9FT ADLT (ELECTROSURGICAL) ×2
ELECTRODE REM PT RTRN 9FT ADLT (ELECTROSURGICAL) ×1 IMPLANT
EVACUATOR 1/8 PVC DRAIN (DRAIN) ×2 IMPLANT
FACESHIELD WRAPAROUND (MASK) ×4 IMPLANT
FACESHIELD WRAPAROUND OR TEAM (MASK) ×1 IMPLANT
GAUZE XEROFORM 5X9 LF (GAUZE/BANDAGES/DRESSINGS) ×2 IMPLANT
GLOVE BIOGEL PI IND STRL 8 (GLOVE) ×2 IMPLANT
GLOVE BIOGEL PI INDICATOR 8 (GLOVE) ×2
GLOVE ECLIPSE 7.5 STRL STRAW (GLOVE) ×4 IMPLANT
GOWN STRL REUS W/ TWL LRG LVL3 (GOWN DISPOSABLE) ×1 IMPLANT
GOWN STRL REUS W/ TWL XL LVL3 (GOWN DISPOSABLE) ×2 IMPLANT
GOWN STRL REUS W/TWL LRG LVL3 (GOWN DISPOSABLE) ×2
GOWN STRL REUS W/TWL XL LVL3 (GOWN DISPOSABLE) ×4
HANDPIECE INTERPULSE COAX TIP (DISPOSABLE) ×2
HOOD PEEL AWAY FACE SHEILD DIS (HOOD) ×5 IMPLANT
IMMOBILIZER KNEE 20 (SOFTGOODS) IMPLANT
IMMOBILIZER KNEE 22 UNIV (SOFTGOODS) ×2 IMPLANT
KIT BASIN OR (CUSTOM PROCEDURE TRAY) ×2 IMPLANT
KIT ROOM TURNOVER OR (KITS) ×2 IMPLANT
MANIFOLD NEPTUNE II (INSTRUMENTS) ×2 IMPLANT
MARKER SKIN DUAL TIP RULER LAB (MISCELLANEOUS) ×1 IMPLANT
NDL HYPO 25GX1X1/2 BEV (NEEDLE) IMPLANT
NEEDLE HYPO 25GX1X1/2 BEV (NEEDLE) IMPLANT
NS IRRIG 1000ML POUR BTL (IV SOLUTION) ×2 IMPLANT
PACK TOTAL JOINT (CUSTOM PROCEDURE TRAY) ×2 IMPLANT
PAD ABD 8X10 STRL (GAUZE/BANDAGES/DRESSINGS) ×1 IMPLANT
PAD ARMBOARD 7.5X6 YLW CONV (MISCELLANEOUS) ×4 IMPLANT
PAD CAST 4YDX4 CTTN HI CHSV (CAST SUPPLIES) ×1 IMPLANT
PADDING CAST COTTON 4X4 STRL (CAST SUPPLIES) ×2
SET HNDPC FAN SPRY TIP SCT (DISPOSABLE) ×1 IMPLANT
SPONGE GAUZE 4X4 12PLY (GAUZE/BANDAGES/DRESSINGS) ×2 IMPLANT
STAPLER VISISTAT 35W (STAPLE) IMPLANT
STRIP CLOSURE SKIN 1/2X4 (GAUZE/BANDAGES/DRESSINGS) ×3 IMPLANT
SUCTION FRAZIER TIP 10 FR DISP (SUCTIONS) ×2 IMPLANT
SUT MNCRL AB 3-0 PS2 18 (SUTURE) ×2 IMPLANT
SUT VIC AB 0 CTB1 27 (SUTURE) ×6 IMPLANT
SUT VIC AB 1 CT1 27 (SUTURE) ×4
SUT VIC AB 1 CT1 27XBRD ANBCTR (SUTURE) ×2 IMPLANT
SUT VIC AB 2-0 CTB1 (SUTURE) ×4 IMPLANT
SYR CONTROL 10ML LL (SYRINGE) IMPLANT
TOWEL OR 17X24 6PK STRL BLUE (TOWEL DISPOSABLE) ×2 IMPLANT
TOWEL OR 17X26 10 PK STRL BLUE (TOWEL DISPOSABLE) ×2 IMPLANT
TRAY FOLEY CATH 14FR (SET/KITS/TRAYS/PACK) ×1 IMPLANT
TRAY FOLEY CATH 16FRSI W/METER (SET/KITS/TRAYS/PACK) ×2 IMPLANT
WATER STERILE IRR 1000ML POUR (IV SOLUTION) ×4 IMPLANT
YANKAUER SUCT BULB TIP NO VENT (SUCTIONS) ×1 IMPLANT

## 2013-12-15 NOTE — Progress Notes (Signed)
Utilization review completed.  

## 2013-12-15 NOTE — Plan of Care (Signed)
Problem: Consults Goal: Diagnosis- Total Joint Replacement Primary Total Knee Left     

## 2013-12-15 NOTE — Brief Op Note (Signed)
12/15/2013  4:19 PM  PATIENT:  [name]  68 y.o. female  PRE-OPERATIVE DIAGNOSIS:  DEGENERATIVE JOINT DISEASE  POST-OPERATIVE DIAGNOSIS:  DEGENERATIVE JOINT DISEASE  PROCEDURE:  Procedure(s): RIGHT TOTAL KNEE ARTHROPLASTY (Right)  SURGEON:  Surgeon(s) and Role:    * Alta Corning, MD - Primary  PHYSICIAN ASSISTANT:   ASSISTANTS: bethune   ANESTHESIA:   general  EBL:  Total I/O In: 700 [I.V.:700] Out: 300 [Urine:250; Blood:50]  BLOOD ADMINISTERED:none  DRAINS: (1) Hemovact drain(s) in the r knee with  Suction Open   LOCAL MEDICATIONS USED:  OTHER experel 2o cc  SPECIMEN:  No Specimen  DISPOSITION OF SPECIMEN:  N/A  COUNTS:  YES  TOURNIQUET:   Total Tourniquet Time Documented: Thigh (Right) - 70 minutes Total: Thigh (Right) - 70 minutes   DICTATION: .Other Dictation: Dictation Number [id number]  PLAN OF CARE: Admit to inpatient   PATIENT DISPOSITION:  PACU - hemodynamically stable.   Delay start of Pharmacological VTE agent (>24hrs) due to surgical blood loss or risk of bleeding: no

## 2013-12-15 NOTE — Anesthesia Postprocedure Evaluation (Signed)
  Anesthesia Post-op Note  Patient: [name]  Procedure(s) Performed: Procedure(s): RIGHT TOTAL KNEE ARTHROPLASTY (Right)  Patient Location: PACU  Anesthesia Type:General and Regional  Level of Consciousness: awake  Airway and Oxygen Therapy: Patient Spontanous Breathing and Patient connected to nasal cannula oxygen  Post-op Pain: mild  Post-op Assessment: Post-op Vital signs reviewed, Patient's Cardiovascular Status Stable, Respiratory Function Stable, Patent Airway, No signs of Nausea or vomiting and Pain level controlled  Post-op Vital Signs: Reviewed and stable  Last Vitals:  Filed Vitals:   12/15/13 1515  BP: 128/74  Pulse: 72  Temp:   Resp: 14    Complications: No apparent anesthesia complications

## 2013-12-15 NOTE — Progress Notes (Signed)
Report given to [facility] RN

## 2013-12-15 NOTE — Transfer of Care (Signed)
Immediate Anesthesia Transfer of Care Note  Patient: [name]  Procedure(s) Performed: Procedure(s): RIGHT TOTAL KNEE ARTHROPLASTY (Right)  Patient Location: PACU  Anesthesia Type:GA combined with regional for post-op pain  Level of Consciousness: awake, alert  and oriented  Airway & Oxygen Therapy: Patient Spontanous Breathing and Patient connected to nasal cannula oxygen  Post-op Assessment: Report given to PACU RN, Post -op Vital signs reviewed and stable and Patient moving all extremities X 4  Post vital signs: Reviewed and stable  Complications: No apparent anesthesia complications

## 2013-12-15 NOTE — Anesthesia Preprocedure Evaluation (Addendum)
Anesthesia Evaluation  Patient identified by MRN, date of birth, ID band Patient awake    Reviewed: Allergy & Precautions, H&P , NPO status , Patient's Chart, lab work & pertinent test results, reviewed documented beta blocker date and time   History of Anesthesia Complications Negative for: history of anesthetic complications  Airway Mallampati: III TM Distance: >3 FB Neck ROM: Full    Dental  (+) Dental Advisory Given, Missing, Chipped, Poor Dentition,    Pulmonary shortness of breath, asthma , sleep apnea ,  breath sounds clear to auscultation        Cardiovascular hypertension, Pt. on home beta blockers and Pt. on medications + angina + CAD, + Past MI, + Peripheral Vascular Disease and +CHF + dysrhythmias Atrial Fibrillation Rhythm:Regular     Neuro/Psych negative neurological ROS  negative psych ROS   GI/Hepatic Neg liver ROS, GERD-  Medicated,  Endo/Other  diabetes, Type 2, Insulin DependentMorbid obesity  Renal/GU Renal InsufficiencyRenal disease     Musculoskeletal   Abdominal   Peds  Hematology negative hematology ROS (+)   Anesthesia Other Findings   Reproductive/Obstetrics                       Anesthesia Physical Anesthesia Plan  ASA: III  Anesthesia Plan: General and Regional   Post-op Pain Management:    Induction: Intravenous  Airway Management Planned: Oral ETT  Additional Equipment: None  Intra-op Plan:   Post-operative Plan: Extubation in OR  Informed Consent: I have reviewed the patients History and Physical, chart, labs and discussed the procedure including the risks, benefits and alternatives for the proposed anesthesia with the patient or authorized representative who has indicated his/her understanding and acceptance.   Dental advisory given  Plan Discussed with: CRNA and Surgeon  Anesthesia Plan Comments:         Anesthesia Quick Evaluation

## 2013-12-15 NOTE — Anesthesia Procedure Notes (Addendum)
Procedure Name: Intubation Date/Time: 12/15/2013 12:30 PM Performed by: Carola Frost Pre-anesthesia Checklist: Patient identified, Timeout performed, Emergency Drugs available, Suction available and Patient being monitored Patient Re-evaluated:Patient Re-evaluated prior to inductionOxygen Delivery Method: Circle system utilized Preoxygenation: Pre-oxygenation with 100% oxygen Intubation Type: IV induction Ventilation: Mask ventilation without difficulty and Oral airway inserted - appropriate to patient size Laryngoscope Size: Mac and 4 Grade View: Grade III Tube type: Oral Tube size: 7.5 mm Number of attempts: 1 Airway Equipment and Method: Stylet Placement Confirmation: positive ETCO2,  ETT inserted through vocal cords under direct vision and breath sounds checked- equal and bilateral Secured at: 22 cm Tube secured with: Tape Dental Injury: Teeth and Oropharynx as per pre-operative assessment     Anesthesia Regional Block:  Femoral nerve block  Pre-Anesthetic Checklist: ,, timeout performed, Correct Patient, Correct Site, Correct Laterality, Correct Procedure, Correct Position, site marked, Risks and benefits discussed,  Surgical consent,  Pre-op evaluation,  At surgeon's request and post-op pain management  Laterality: Lower and Right  Prep: chloraprep       Needles:  Injection technique: Single-shot  Needle Type: Echogenic Stimulator Needle          Additional Needles:  Procedures: ultrasound guided (picture in chart) Femoral nerve block  Nerve Stimulator or Paresthesia:  Response: patellar twitch, 0.4 mA,   Additional Responses:   Narrative:  Start time: 12/15/2013 12:20 PM End time: 12/15/2013 12:26 PM Injection made incrementally with aspirations every 5 mL.  Performed by: Personally  Anesthesiologist: Orhan Mayorga  Additional Notes: H+P and labs reviewed, risks and benefits discussed with patient, procedure tolerated well without complications

## 2013-12-15 NOTE — Progress Notes (Signed)
Orthopedic Tech Progress Note Patient Details:  [name] May 07, 1946 [id number]  CPM Right Knee CPM Right Knee: On Right Knee Flexion (Degrees): 60 Right Knee Extension (Degrees): 0 Additional Comments: put ohf on bed  Ortho Devices Ortho Device/Splint Location: fottsie roll RLE Ortho Device/Splint Interventions: Ordered   [name] 12/15/2013, 3:35 PM

## 2013-12-15 NOTE — H&P (Signed)
TOTAL KNEE ADMISSION H&P  Patient is being admitted for right total knee arthroplasty.  Subjective:  Chief Complaint:right knee pain.  HPI: [name], 68 y.o. female, has a history of pain and functional disability in the right knee due to arthritis and has failed non-surgical conservative treatments for greater than 12 weeks to includeNSAID's and/or analgesics, corticosteriod injections, viscosupplementation injections, use of assistive devices, weight reduction as appropriate and activity modification.  Onset of symptoms was gradual, starting 3 years ago with gradually worsening course since that time. The patient noted no past surgery on the right knee(s).  Patient currently rates pain in the right knee(s) at 8 out of 10 with activity. Patient has night pain, worsening of pain with activity and weight bearing, pain that interferes with activities of daily living, pain with passive range of motion, crepitus and joint swelling.  Patient has evidence of subchondral cysts, subchondral sclerosis, joint subluxation and joint space narrowing by imaging studies. This patient has had failure of conservativwe care. There is no active infection.  Patient Active Problem List   Diagnosis Date Noted  . Unspecified constipation 07/29/2013  . LLQ pain 07/29/2013  . GERD (gastroesophageal reflux disease) 07/29/2013  . Obstructive sleep apnea 07/29/2013  . [name]'s esophagus 07/29/2013  . Fatty tumor    Past Medical History  Diagnosis Date  . Atrial flutter   . Hypertension   . Diabetes mellitus   . Diastolic heart failure   . Asthma   . Hyperlipidemia   . Fatty liver   . Esophageal dysmotility   . Arthritis   . Congestive heart failure   . Sleep apnea     wears CPAP  . Family history of malignant neoplasm of gastrointestinal tract   . Fatty tumor fatty tumor back  . Coronary atherosclerosis of native coronary artery   . Morbid obesity   . Myocardial infarction 2009  . Dysrhythmia    afib,flutter hx  . Heart murmur   . Peripheral vascular disease   . Shortness of breath   . Pneumonia     hx  . GERD (gastroesophageal reflux disease)     barrets esophagus  . Anginal pain     occ; non-ischemic Lexiscan 09/2012  . Kidney disease     CKD stage IV (Dr. Erling Cruz)    Past Surgical History  Procedure Laterality Date  . Coronary angioplasty with stent placement    . Breast lumpectomy      right  . Tubal ligation    . Tonsillectomy      Prescriptions prior to admission  Medication Sig Dispense Refill  . acetaminophen (TYLENOL) 500 MG tablet Take 500 mg by mouth every 6 (six) hours as needed for mild pain.      Marland Kitchen amitriptyline (ELAVIL) 25 MG tablet Take 25 mg by mouth at bedtime.        Marland Kitchen amLODipine (NORVASC) 10 MG tablet Take 10 mg by mouth daily.      Marland Kitchen aspirin 81 MG tablet Take 81 mg by mouth daily.        . carvedilol (COREG) 25 MG tablet Take 37.5 mg by mouth 2 (two) times daily with a meal.       . cloNIDine (CATAPRES) 0.3 MG tablet Take 0.3 mg by mouth 2 (two) times daily.        . clopidogrel (PLAVIX) 75 MG tablet Take 75 mg by mouth daily.        Marland Kitchen dicyclomine (BENTYL) 10 MG capsule Take  10 mg by mouth 4 (four) times daily as needed (IBS).      Marland Kitchen esomeprazole (NEXIUM) 40 MG capsule Take 40 mg by mouth daily at 12 noon.      . febuxostat (ULORIC) 40 MG tablet Take 40 mg by mouth daily.       . furosemide (LASIX) 80 MG tablet Take 160 mg by mouth 3 (three) times daily.       . insulin glargine (LANTUS) 100 UNIT/ML injection Inject 50 Units into the skin 2 (two) times daily.       . insulin lispro (HUMALOG) 100 UNIT/ML injection Inject 35 Units into the skin 3 (three) times daily after meals.       . Linaclotide (LINZESS) 145 MCG CAPS capsule Take 290 mcg by mouth daily.      . potassium chloride SA (K-DUR,KLOR-CON) 20 MEQ tablet Take 20 mEq by mouth daily.       . rosuvastatin (CRESTOR) 10 MG tablet Take 10 mg by mouth daily.        Marland Kitchen zolpidem (AMBIEN) 10 MG  tablet Take 10 mg by mouth at bedtime as needed for sleep.       Marland Kitchen albuterol (PROVENTIL HFA;VENTOLIN HFA) 108 (90 BASE) MCG/ACT inhaler Inhale 2 puffs into the lungs every 4 (four) hours as needed for wheezing.       . nitroGLYCERIN (NITROSTAT) 0.4 MG SL tablet Place 0.4 mg under the tongue every 5 (five) minutes as needed for chest pain.       . ranitidine (ZANTAC) 150 MG tablet Take 1 tablet (150 mg total) by mouth at bedtime.  30 tablet  11   Allergies  Allergen Reactions  . Codeine Nausea And Vomiting  . Penicillins Nausea And Vomiting  . Sulfa Antibiotics Itching and Nausea And Vomiting    "everything I seen was red"  . Other Itching    Adhesive from ekg leads    History  Substance Use Topics  . Smoking status: Never Smoker   . Smokeless tobacco: Never Used  . Alcohol Use: No    Family History  Problem Relation Age of Onset  . Heart disease Mother   . Cancer Mother     bladder  . Kidney disease Mother   . Ovarian cancer Daughter   . Stomach cancer Maternal Uncle   . Colon cancer Maternal Aunt   . Esophageal cancer Neg Hx      ROS ROS: I have reviewed the patient's review of systems thoroughly and there are no positive responses as relates to the HPI. Objective:  Physical Exam  Vital signs in last 24 hours: Temp:  [98.5 F (36.9 C)] 98.5 F (36.9 C) (04/13 1046) Pulse Rate:  [59-64] 61 (04/13 1150) Resp:  [10-24] 18 (04/13 1150) BP: (141-161)/(70-94) 151/89 mmHg (04/13 1150) SpO2:  [94 %-100 %] 98 % (04/13 1150) Weight:  [241 lb 13.5 oz (109.7 kg)] 241 lb 13.5 oz (109.7 kg) (04/13 1046) Well-developed well-nourished patient in no acute distress. Alert and oriented x3 HEENT:within normal limits Cardiac: Regular rate and rhythm Pulmonary: Lungs clear to auscultation Abdomen: Soft and nontender.  Normal active bowel sounds  Musculoskeletal: r knee painful rom.  No instability med jt line tender Labs: Recent Results (from the past 2160 hour(s))  SURGICAL PCR  SCREEN     Status: None   Collection Time    12/09/13  1:11 PM      Result Value Ref Range   MRSA, PCR NEGATIVE  NEGATIVE  Staphylococcus aureus NEGATIVE  NEGATIVE   Comment:            The Xpert SA Assay (FDA     approved for NASAL specimens     in patients over 73 years of age),     is one component of     a comprehensive surveillance     program.  Test performance has     been validated by [employer] for patients greater     than or equal to 60 year old.     It is not intended     to diagnose infection nor to     guide or monitor treatment.  URINALYSIS, ROUTINE W REFLEX MICROSCOPIC     Status: Abnormal   Collection Time    12/09/13  1:12 PM      Result Value Ref Range   Color, Urine YELLOW  YELLOW   APPearance CLEAR  CLEAR   Specific Gravity, Urine 1.017  1.005 - 1.030   pH 6.0  5.0 - 8.0   Glucose, UA NEGATIVE  NEGATIVE mg/dL   Hgb urine dipstick NEGATIVE  NEGATIVE   Bilirubin Urine NEGATIVE  NEGATIVE   Ketones, ur NEGATIVE  NEGATIVE mg/dL   Protein, ur NEGATIVE  NEGATIVE mg/dL   Urobilinogen, UA 1.0  0.0 - 1.0 mg/dL   Nitrite NEGATIVE  NEGATIVE   Leukocytes, UA SMALL (*) NEGATIVE  URINE MICROSCOPIC-ADD ON     Status: Abnormal   Collection Time    12/09/13  1:12 PM      Result Value Ref Range   Squamous Epithelial / LPF FEW (*) RARE   WBC, UA 3-6  <3 WBC/hpf   Bacteria, UA FEW (*) RARE  TYPE AND SCREEN     Status: None   Collection Time    12/09/13  1:20 PM      Result Value Ref Range   ABO/RH(D) A POS     Antibody Screen NEG     Sample Expiration 12/23/2013    ABO/RH     Status: None   Collection Time    12/09/13  1:20 PM      Result Value Ref Range   ABO/RH(D) A POS    APTT     Status: None   Collection Time    12/09/13  1:30 PM      Result Value Ref Range   aPTT 26  24 - 37 seconds  CBC WITH DIFFERENTIAL     Status: Abnormal   Collection Time    12/09/13  1:30 PM      Result Value Ref Range   WBC 7.3  4.0 - 10.5 K/uL   RBC 4.78  3.87 - 5.11  MIL/uL   Hemoglobin 15.3 (*) 12.0 - 15.0 g/dL   HCT 44.2  36.0 - 46.0 %   MCV 92.5  78.0 - 100.0 fL   MCH 32.0  26.0 - 34.0 pg   MCHC 34.6  30.0 - 36.0 g/dL   RDW 15.3  11.5 - 15.5 %   Platelets 242  150 - 400 K/uL   Neutrophils Relative % 40 (*) 43 - 77 %   Neutro Abs 3.0  1.7 - 7.7 K/uL   Lymphocytes Relative 39  12 - 46 %   Lymphs Abs 2.8  0.7 - 4.0 K/uL   Monocytes Relative 9  3 - 12 %   Monocytes Absolute 0.6  0.1 - 1.0 K/uL   Eosinophils Relative 11 (*)  0 - 5 %   Eosinophils Absolute 0.8 (*) 0.0 - 0.7 K/uL   Basophils Relative 1  0 - 1 %   Basophils Absolute 0.0  0.0 - 0.1 K/uL  COMPREHENSIVE METABOLIC PANEL     Status: Abnormal   Collection Time    12/09/13  1:30 PM      Result Value Ref Range   Sodium 141  137 - 147 mEq/L   Potassium 3.0 (*) 3.7 - 5.3 mEq/L   Chloride 99  96 - 112 mEq/L   CO2 24  19 - 32 mEq/L   Glucose, Bld 151 (*) 70 - 99 mg/dL   BUN 21  6 - 23 mg/dL   Creatinine, Ser 2.37 (*) 0.50 - 1.10 mg/dL   Calcium 9.3  8.4 - 10.5 mg/dL   Total Protein 7.5  6.0 - 8.3 g/dL   Albumin 3.7  3.5 - 5.2 g/dL   AST 23  0 - 37 U/L   ALT 14  0 - 35 U/L   Alkaline Phosphatase 66  39 - 117 U/L   Total Bilirubin 0.3  0.3 - 1.2 mg/dL   GFR calc non Af Amer 20 (*) >90 mL/min   GFR calc Af Amer 23 (*) >90 mL/min   Comment: (NOTE)     The eGFR has been calculated using the CKD EPI equation.     This calculation has not been validated in all clinical situations.     eGFR's persistently <90 mL/min signify possible Chronic Kidney     Disease.  PROTIME-INR     Status: None   Collection Time    12/09/13  1:30 PM      Result Value Ref Range   Prothrombin Time 12.6  11.6 - 15.2 seconds   INR 0.96  0.00 - 1.49  COMPREHENSIVE METABOLIC PANEL     Status: Abnormal   Collection Time    12/15/13 10:36 AM      Result Value Ref Range   Sodium 145  137 - 147 mEq/L   Potassium 3.4 (*) 3.7 - 5.3 mEq/L   Chloride 105  96 - 112 mEq/L   CO2 22  19 - 32 mEq/L   Glucose, Bld 143 (*)  70 - 99 mg/dL   BUN 20  6 - 23 mg/dL   Creatinine, Ser 2.07 (*) 0.50 - 1.10 mg/dL   Calcium 9.4  8.4 - 10.5 mg/dL   Total Protein 7.4  6.0 - 8.3 g/dL   Albumin 3.7  3.5 - 5.2 g/dL   AST 20  0 - 37 U/L   ALT 12  0 - 35 U/L   Alkaline Phosphatase 60  39 - 117 U/L   Total Bilirubin 0.4  0.3 - 1.2 mg/dL   GFR calc non Af Amer 24 (*) >90 mL/min   GFR calc Af Amer 27 (*) >90 mL/min   Comment: (NOTE)     The eGFR has been calculated using the CKD EPI equation.     This calculation has not been validated in all clinical situations.     eGFR's persistently <90 mL/min signify possible Chronic Kidney     Disease.  GLUCOSE, CAPILLARY     Status: Abnormal   Collection Time    12/15/13 10:39 AM      Result Value Ref Range   Glucose-Capillary 131 (*) 70 - 99 mg/dL    Estimated body mass index is 41.49 kg/(m^2) as calculated from the following:   Height as of this  encounter: 5' 4"  (1.626 m).   Weight as of this encounter: 241 lb 13.5 oz (109.7 kg).   Imaging Review Plain radiographs demonstrate severe degenerative joint disease of the right knee(s). The overall alignment ismild valgus. The bone quality appears to be good for age and reported activity level.  Assessment/Plan:  End stage arthritis, right knee   The patient history, physical examination, clinical judgment of the provider and imaging studies are consistent with end stage degenerative joint disease of the right knee(s) and total knee arthroplasty is deemed medically necessary. The treatment options including medical management, injection therapy arthroscopy and arthroplasty were discussed at length. The risks and benefits of total knee arthroplasty were presented and reviewed. The risks due to aseptic loosening, infection, stiffness, patella tracking problems, thromboembolic complications and other imponderables were discussed. The patient acknowledged the explanation, agreed to proceed with the plan and consent was signed. Patient is  being admitted for inpatient treatment for surgery, pain control, PT, OT, prophylactic antibiotics, VTE prophylaxis, progressive ambulation and ADL's and discharge planning. The patient is planning to be discharged home with home health services

## 2013-12-15 NOTE — Progress Notes (Signed)
Orthopedic Tech Progress Note Patient Details:  [name] 1946-05-03 [id number] cpm start time for cpm 1525 Patient ID: [name], female   DOB: 11/23/1945, 68 y.o.   MRN: [id number]   [name] 12/15/2013, 3:40 PM

## 2013-12-16 ENCOUNTER — Encounter (HOSPITAL_COMMUNITY): Payer: Self-pay | Admitting: Orthopedic Surgery

## 2013-12-16 LAB — CBC
HCT: 38.9 % (ref 36.0–46.0)
Hemoglobin: 12.8 g/dL (ref 12.0–15.0)
MCH: 31.3 pg (ref 26.0–34.0)
MCHC: 32.9 g/dL (ref 30.0–36.0)
MCV: 95.1 fL (ref 78.0–100.0)
Platelets: 258 10*3/uL (ref 150–400)
RBC: 4.09 MIL/uL (ref 3.87–5.11)
RDW: 15.8 % — ABNORMAL HIGH (ref 11.5–15.5)
WBC: 12 10*3/uL — ABNORMAL HIGH (ref 4.0–10.5)

## 2013-12-16 LAB — BASIC METABOLIC PANEL
BUN: 19 mg/dL (ref 6–23)
CO2: 23 mEq/L (ref 19–32)
Calcium: 8.6 mg/dL (ref 8.4–10.5)
Chloride: 105 mEq/L (ref 96–112)
Creatinine, Ser: 1.96 mg/dL — ABNORMAL HIGH (ref 0.50–1.10)
GFR calc Af Amer: 29 mL/min — ABNORMAL LOW (ref >90)
GFR calc non Af Amer: 25 mL/min — ABNORMAL LOW (ref >90)
Glucose, Bld: 183 mg/dL — ABNORMAL HIGH (ref 70–99)
Potassium: 3.6 mEq/L — ABNORMAL LOW (ref 3.7–5.3)
Sodium: 144 mEq/L (ref 137–147)

## 2013-12-16 LAB — GLUCOSE, CAPILLARY
Glucose-Capillary: 171 mg/dL — ABNORMAL HIGH (ref 70–99)
Glucose-Capillary: 168 mg/dL — ABNORMAL HIGH (ref 70–99)
Glucose-Capillary: 235 mg/dL — ABNORMAL HIGH (ref 70–99)
Glucose-Capillary: 213 mg/dL — ABNORMAL HIGH (ref 70–99)
Glucose-Capillary: 153 mg/dL — ABNORMAL HIGH (ref 70–99)

## 2013-12-16 MED ORDER — POTASSIUM CHLORIDE CRYS ER 20 MEQ PO TBCR
20.0000 meq | EXTENDED_RELEASE_TABLET | Freq: Two times a day (BID) | ORAL | Status: DC
  Administered 2013-12-16 – 2013-12-21 (×11): 20 meq via ORAL
  Filled 2013-12-16 (×15): qty 1

## 2013-12-16 NOTE — Care Management Note (Signed)
CARE MANAGEMENT NOTE 12/16/2013  Patient:  [name], [name]   Account Number:  [id number]  Date Initiated:  12/16/2013  Documentation initiated by:  Ricki Miller  Subjective/Objective Assessment:   68 yr old female s/p right total knee arthroplasty.     Action/Plan:   Case manager spoke with patient concerning home health and DME needs at discharge. Patient preoperatively setup with [facility], no changes. DME has been delivered. Has family support at discharge.   Anticipated DC Date:  12/17/2013   Anticipated DC Plan:  [place]  CM consult      [facility] Choice  HOME HEALTH  DURABLE MEDICAL EQUIPMENT   Choice offered to / List presented to:  C-1 Patient   DME arranged  3-N-1  [place]  CPM      DME agency  [employer]     [place] arranged  HH-2 PT      [facility] agency  [place]   Status of service:  Completed, signed off Medicare Important Message given?   (If response is "NO", the following Medicare IM given date fields will be blank) Date Medicare IM given:   Date Additional Medicare IM given:    Discharge Disposition:  [place]

## 2013-12-16 NOTE — Evaluation (Signed)
Physical Therapy Evaluation Patient Details Name: [name] MRN: [id number] DOB: Aug 22, 1946 Today's Date: 12/16/2013   History of Present Illness  Pt is a 68 y.o. female s/p right TKA, with history of arthritis of left knee  Clinical Impression  Pt is s/p right TKA resulting in the deficits listed below (see PT Problem List). She ambulates slow and cautiously but knee immobilizer is providing good support and there were no instances of knee buckling or loss of balance. Pt is motivated to go home however has 22 steps to climb in order to enter her condo. Pt will benefit from skilled PT to increase their independence and safety with mobility to allow discharge to the venue listed below. PT to continue working with patient to improve mobility, specifically focus on ability climb a flight of stairs in order to safely d/c home.     Follow Up Recommendations Home health PT;Supervision - Intermittent    Equipment Recommendations  None recommended by PT    Recommendations for Other Services OT consult     Precautions / Restrictions Precautions Precautions: Knee Required Braces or Orthoses: Knee Immobilizer - Right Knee Immobilizer - Right: On when out of bed or walking Restrictions Weight Bearing Restrictions: Yes RLE Weight Bearing: Weight bearing as tolerated LLE Weight Bearing: Weight bearing as tolerated      Mobility  Bed Mobility Overal bed mobility: Needs Assistance Bed Mobility: Supine to Sit     Supine to sit: Min assist;HOB elevated     General bed mobility comments: Min assist for trunk contorl with supine>sit and HOB elevated to 30 degrees. Pt requires extra time and cues for technique  Transfers Overall transfer level: Needs assistance Equipment used: Rolling walker (2 wheeled) Transfers: Sit to/from Stand Sit to Stand: Mod assist;+2 physical assistance         General transfer comment: Mod assist to power-up lift to stand +2. Pt does have good strength in  LLE, suspect pt would be able to perform sit>stand +1. Verbal cues for hand placement and body positioning. Once standing needs cues to bring hips forward and feet under waist. Relies heavily on RW  Ambulation/Gait Ambulation/Gait assistance: Min guard;+2 safety/equipment Ambulation Distance (Feet): 15 Feet Assistive device: Rolling walker (2 wheeled) Gait Pattern/deviations: Step-to pattern;Decreased step length - left;Decreased stance time - right;Antalgic;Trunk flexed Gait velocity: Very slow   General Gait Details: Pt ambulates slowly and cautiously. Knee immobilizer on R provides good control of knee as no instances of buckling occured. Verbal cues provided for sequencing and to instruct for upright posture. Extended period of time to walk 15 feet  Stairs            Wheelchair Mobility    Modified Rankin (Stroke Patients Only)       Balance Overall balance assessment: Needs assistance Sitting-balance support: No upper extremity supported Sitting balance-Leahy Scale: Fair Sitting balance - Comments: Sits EOB without assist   Standing balance support: Bilateral upper extremity supported Standing balance-Leahy Scale: Poor Standing balance comment: Posterior lean upon standing initially but able to correct. Requires BIL UEs for support to stand                             Pertinent Vitals/Pain Pt states pain 10/10 - states nurse just administered pain medicine prior to PT arrival Pt repositioned in chair for comfort.    Home Living Family/patient expects to be discharged to:: Private residence Living Arrangements: Children Available Help  at Discharge: Family;Other (Comment) (daughter works from [phone]) Type of Home: House Home Access: Stairs to enter Entrance Stairs-Rails: Right Entrance Stairs-Number of Steps: Alligator: One level Home Equipment: Environmental consultant - 2 wheels;Bedside commode;Tub bench      Prior Function Level of Independence: Independent  with assistive device(s)               Hand Dominance   Dominant Hand: Right    Extremity/Trunk Assessment   Upper Extremity Assessment: Defer to OT evaluation           Lower Extremity Assessment: RLE deficits/detail;LLE deficits/detail RLE Deficits / Details: decreased strength and ROM       Communication   Communication: No difficulties  Cognition Arousal/Alertness: Awake/alert Behavior During Therapy: WFL for tasks assessed/performed Overall Cognitive Status: Within Functional Limits for tasks assessed                      General Comments General comments (skin integrity, edema, etc.): Pt educated on course of hospital stay as she works with therapy and importance of compliance with exercises and stair training in order to return home    Exercises Total Joint Exercises Ankle Circles/Pumps: AROM;Both;10 reps;Supine Quad Sets: AROM;Right;10 reps;Supine      Assessment/Plan    PT Assessment Patient needs continued PT services  PT Diagnosis Difficulty walking;Abnormality of gait;Acute pain   PT Problem List Decreased strength;Decreased range of motion;Decreased activity tolerance;Decreased balance;Decreased mobility;Decreased knowledge of use of DME;Decreased knowledge of precautions;Cardiopulmonary status limiting activity;Pain;Obesity  PT Treatment Interventions DME instruction;Gait training;Stair training;Functional mobility training;Therapeutic activities;Therapeutic exercise;Balance training;Neuromuscular re-education;Patient/family education;Modalities   PT Goals (Current goals can be found in the Care Plan section) Acute Rehab PT Goals Patient Stated Goal: Go home PT Goal Formulation: With patient Time For Goal Achievement: 12/23/13 Potential to Achieve Goals: Good    Frequency 7X/week   Barriers to discharge Inaccessible home environment Pt has 22 steps to climb in order to enter house    Co-evaluation               End of  Session Equipment Utilized During Treatment: Gait belt;Right knee immobilizer Activity Tolerance: Patient tolerated treatment well Patient left: in chair;with call bell/phone within reach Nurse Communication: Mobility status         Time: 0912-0935 PT Time Calculation (min): 23 min   Charges:   PT Evaluation $Initial PT Evaluation Tier I: 1 Procedure PT Treatments $Gait Training: 8-22 mins   PT G CodesCamille [place], [place] 12/16/2013, 10:41 AM

## 2013-12-16 NOTE — Progress Notes (Addendum)
Subjective: 1 Day Post-Op Procedure(s) (LRB): RIGHT TOTAL KNEE ARTHROPLASTY (Right) Patient reports pain as moderate.    Objective: Vital signs in last 24 hours: Temp:  [98 F (36.7 C)-98.7 F (37.1 C)] 98.6 F (37 C) (04/14 0640) Pulse Rate:  [59-80] 67 (04/14 0739) Resp:  [10-24] 18 (04/14 0739) BP: (128-161)/(70-94) 140/75 mmHg (04/14 0640) SpO2:  [89 %-100 %] 98 % (04/14 0739) Weight:  [241 lb 13.5 oz (109.7 kg)] 241 lb 13.5 oz (109.7 kg) (04/13 1046)  Intake/Output from previous day: 04/13 0701 - 04/14 0700 In: 2296.7 [P.O.:100; I.V.:2196.7] Out: 1145 [Urine:1075; Drains:20; Blood:50] Intake/Output this shift:     Recent Labs  12/16/13 0420  HGB 12.8    Recent Labs  12/16/13 0420  WBC 12.0*  RBC 4.09  HCT 38.9  PLT 258    Recent Labs  12/15/13 1036 12/16/13 0420  NA 145 144  K 3.4* 3.6*  CL 105 105  CO2 22 23  BUN 20 19  CREATININE 2.07* 1.96*  GLUCOSE 143* 183*  CALCIUM 9.4 8.6   No results found for this basename: LABPT, INR,  in the last 72 hours  Neurologically intact ABD soft Neurovascular intact Sensation intact distally Intact pulses distally Dorsiflexion/Plantar flexion intact No cellulitis present Compartment soft  Assessment/Plan: 1 Day Post-Op Procedure(s) (LRB): RIGHT TOTAL KNEE ARTHROPLASTY (Right) Hypokalemia. Plan: Increased potassium dose for a couple of days.  I check  BMET in the a.m. Advance diet Up with therapy Plan for discharge tomorrow or Thursday depending on pain control and mobility  Alta Cor[place] 12/16/2013, 8:39 AM

## 2013-12-16 NOTE — Progress Notes (Signed)
Physical Therapy Treatment Patient Details Name: RAELEE [name] MRN: [id number] DOB: Mar 26, 1946 Today's Date: 12/16/2013    History of Present Illness Pt is a 68 y.o. female s/p right TKA, with history of arthritis of left knee    PT Comments    Patient requires increased time for therapy due to fatigue. Patient received breathing treatment prior to therapy. Continue to work with patient to progress towards functional goals. Continue to recommend HHPT at this time.   Follow Up Recommendations  Home health PT;Supervision - Intermittent     Equipment Recommendations  None recommended by PT    Recommendations for Other Services OT consult     Precautions / Restrictions Precautions Precautions: Knee Required Braces or Orthoses: Knee Immobilizer - Right Knee Immobilizer - Right: On when out of bed or walking Restrictions RLE Weight Bearing: Weight bearing as tolerated LLE Weight Bearing: Weight bearing as tolerated    Mobility  Bed Mobility Overal bed mobility: Needs Assistance Bed Mobility: Sit to Sidelying     Supine to sit: Min assist;HOB elevated     General bed mobility comments: Patient requires min A for RLE with bed mobility.  Transfers Overall transfer level: Needs assistance Equipment used: Rolling walker (2 wheeled) Transfers: Sit to/from Stand Sit to Stand: Mod assist;+2 physical assistance         General transfer comment: Mod assist to power-up lift to stand +2. Patient appears limited from fatigue. Verbal cues for hand placement and body positioning. Once standing needs cues to bring hips forward and feet under waist. Relies heavily on RW  Ambulation/Gait                 Stairs            Wheelchair Mobility    Modified Rankin (Stroke Patients Only)       Balance Overall balance assessment: Needs assistance Sitting-balance support: No upper extremity supported Sitting balance-Leahy Scale: Fair Sitting balance - Comments: Sits  EOB without assist   Standing balance support: Bilateral upper extremity supported Standing balance-Leahy Scale: Poor Standing balance comment: Patient relies bilateral UE for support in standing                    Cognition Arousal/Alertness: Lethargic;Suspect due to medications Behavior During Therapy: [facility] for tasks assessed/performed Overall Cognitive Status: Within Functional Limits for tasks assessed                      Exercises Total Joint Exercises Ankle Circles/Pumps: AROM;Both;Supine Quad Sets: AROM;Supine;10 reps;Right    General Comments        Pertinent Vitals/Pain Patient denies pain.    Home Living                      Prior Function            PT Goals (current goals can now be found in the care plan section) Progress towards PT goals: Not progressing toward goals - comment (limited by fatigue)    Frequency  7X/week    PT Plan Current plan remains appropriate    Co-evaluation             End of Session Equipment Utilized During Treatment: Gait belt;Right knee immobilizer Activity Tolerance: Patient limited by fatigue Patient left: in bed;with call bell/phone within reach     Time: 1439-1510 PT Time Calculation (min): 31 min  Charges:  G Codes:      [place] [name], SPTA 12/16/2013, 3:23 PM

## 2013-12-16 NOTE — Progress Notes (Signed)
Orthopedic Tech Progress Note Patient Details:  [name] 1946-07-27 [id number] On cpm at 8:20 pm RLE 0-60 Patient ID: [name], female   DOB: May 06, 1946, 69 y.o.   MRN: [id number]   [name] 12/16/2013, 8:22 PM

## 2013-12-16 NOTE — Progress Notes (Signed)
12/16/13 2200  BiPAP/CPAP/SIPAP  BiPAP/CPAP/SIPAP Pt Type Adult  Mask Type Full face mask  Mask Size Medium  Respiratory Rate 15 breaths/min  IPAP 20 cmH20 (Auto mode)  EPAP 5 cmH2O (Auto mode)  Flow Rate 3 lpm  BiPAP/CPAP/SIPAP CPAP  Patient Home Equipment No  Auto Titrate Yes  BiPAP/CPAP /SiPAP Vitals  Resp 15  Patient placed on CPAP Auto mode, Max IPAP 20 and min EPAP 5 with 3L oxygen bleed in. She wears a Full Face Mask and is tolerating it very well at this time.

## 2013-12-16 NOTE — Op Note (Signed)
NAMEFEIGE, KRUTZ                ACCOUNT NO.:  [id number]  MEDICAL RECORD NO.:  [id number]  LOCATION:  [id number]                        FACILITY:  [facility]  PHYSICIAN:  Alta Corning, M.D.   DATE OF BIRTH:  15-Mar-1946  DATE OF PROCEDURE:  12/15/2013 DATE OF DISCHARGE:                              OPERATIVE REPORT   PREOPERATIVE DIAGNOSIS:  End-stage degenerative joint disease, right knee.  POSTOPERATIVE DIAGNOSIS:  End-stage degenerative joint disease, right knee.  PRINCIPAL PROCEDURE:  Right total knee replacement with a Sigma system size 3 femur, size 3 tibia, 10-mm bridging bearing, and a 35-mm all- polyethylene patella.  SURGEON:  Alta Corning, M.D.  ASSISTANT:  Gary Fleet, P.A.  ANESTHESIA:  General.  BRIEF HISTORY:  Ms. Philip is a 68 year old female with long history of having severe bilateral degenerative joint disease of the knee.  She had been treated conservatively for period of time.  After failure of all conservative care including injection therapy, activity modification, physical therapy, viscous supplementation, the patient was taken to the operating room for right total knee replacement.  The patient was having significant night pain and low activity pain prior to the procedure.  DESCRIPTION OF PROCEDURE:  The patient was taken to the operating room. After adequate anesthesia was obtained with general anesthetic, the patient was placed supine on the operating table.  The right leg was then prepped and draped in usual sterile fashion.  Following this, the leg was exsanguinated and the blood pressure tourniquet was inflated to 300 mmHg.  Following this, a midline incision was made and subcutaneous tissue down the level of the extensor mechanism and medial parapatellar arthrotomy was undertaken.  Following this, attention was turned to the knee where medial and lateral meniscus were removed, retropatellar fat pad, synovium in the anterior aspect of the femur  and anterior and posterior cruciates.  Once this was done, the tibia was cut perpendicular to long axis with an extramedullary guide.  An intramedullary pilot hole was then drilled in the femur, it was then cut perpendicular to the anatomic axis with a 4-degree valgus alignment. Once this was done, the spacer blocks were put in place and excellent gap balance was achieved.  Attention was then turned to the femur, which sized to a 3.  Anterior and posterior cuts were made, chamfers and box. Attention was turned to the tibia, sized to a 3, it was drilled and keeled and trials were put in place.  Attention was turned to the patella, which was cut down to a level of 13 mm and the 35 paddle was chosen.  The lugs were drilled.  Once this was completed, trial reduction was undertaken.  The knee was put through a range of motion, excellent stability and range of motion were achieved.  At this point, all trial components were removed and the final components were then cemented into place after the knee was copiously and thoroughly lavaged and suctioned dry.  Final components were then cemented into place, size 3 femur, size 3 tibia with a 10-mm bridging bearing trial, and a 35-mm all-polyethylene patella.  Once this was done, the cement was allowed to harden.  All excess bone  cement was removed.  20 mL of Exparel with 40 mL of saline was then injected in the posterior aspect of the knee, anterior aspect of the knee and all around the knee.  Once this was done, tourniquet was let down.  All bleedings were controlled with electrocautery.  The final size 10 poly was placed and the knee put through a range of motion and stability once again checked, it was excellent.  A medium Hemovac drain was placed.  Medial parapatellar arthrotomy was closed with 1 Vicryl running, skin with 0 and 2-0 Vicryl, and 3-0 Monocryl subcuticular.  Benzoin and Steri-Strips were applied. Sterile compressive dressing was  applied.  The patient was taken to the recovery room, she was noted to be in satisfactory condition.  Estimated blood loss for the procedure is minimal.     Alta Corning, M.D.     Corliss Skains  D:  12/15/2013  T:  12/16/2013  Job:  [id number]

## 2013-12-17 ENCOUNTER — Inpatient Hospital Stay (HOSPITAL_COMMUNITY): Payer: Medicare HMO

## 2013-12-17 DIAGNOSIS — N184 Chronic kidney disease, stage 4 (severe): Secondary | ICD-10-CM

## 2013-12-17 DIAGNOSIS — N185 Chronic kidney disease, stage 5: Secondary | ICD-10-CM | POA: Diagnosis present

## 2013-12-17 DIAGNOSIS — E1129 Type 2 diabetes mellitus with other diabetic kidney complication: Secondary | ICD-10-CM | POA: Diagnosis present

## 2013-12-17 DIAGNOSIS — J9600 Acute respiratory failure, unspecified whether with hypoxia or hypercapnia: Secondary | ICD-10-CM

## 2013-12-17 DIAGNOSIS — E119 Type 2 diabetes mellitus without complications: Secondary | ICD-10-CM

## 2013-12-17 DIAGNOSIS — G4733 Obstructive sleep apnea (adult) (pediatric): Secondary | ICD-10-CM

## 2013-12-17 LAB — BASIC METABOLIC PANEL
BUN: 24 mg/dL — ABNORMAL HIGH (ref 6–23)
CO2: 22 mEq/L (ref 19–32)
Calcium: 8.4 mg/dL (ref 8.4–10.5)
Chloride: 106 mEq/L (ref 96–112)
Creatinine, Ser: 2.49 mg/dL — ABNORMAL HIGH (ref 0.50–1.10)
GFR calc Af Amer: 22 mL/min — ABNORMAL LOW (ref >90)
GFR calc non Af Amer: 19 mL/min — ABNORMAL LOW (ref >90)
Glucose, Bld: 145 mg/dL — ABNORMAL HIGH (ref 70–99)
Potassium: 3.2 mEq/L — ABNORMAL LOW (ref 3.7–5.3)
Sodium: 144 mEq/L (ref 137–147)

## 2013-12-17 LAB — GLUCOSE, CAPILLARY
Glucose-Capillary: 137 mg/dL — ABNORMAL HIGH (ref 70–99)
Glucose-Capillary: 167 mg/dL — ABNORMAL HIGH (ref 70–99)
Glucose-Capillary: 162 mg/dL — ABNORMAL HIGH (ref 70–99)
Glucose-Capillary: 141 mg/dL — ABNORMAL HIGH (ref 70–99)

## 2013-12-17 LAB — CBC
HCT: 35.4 % — ABNORMAL LOW (ref 36.0–46.0)
Hemoglobin: 11.5 g/dL — ABNORMAL LOW (ref 12.0–15.0)
MCH: 30.8 pg (ref 26.0–34.0)
MCHC: 32.5 g/dL (ref 30.0–36.0)
MCV: 94.9 fL (ref 78.0–100.0)
Platelets: 204 10*3/uL (ref 150–400)
RBC: 3.73 MIL/uL — ABNORMAL LOW (ref 3.87–5.11)
RDW: 15.8 % — ABNORMAL HIGH (ref 11.5–15.5)
WBC: 12.2 10*3/uL — ABNORMAL HIGH (ref 4.0–10.5)

## 2013-12-17 MED ORDER — ASPIRIN EC 325 MG PO TBEC
325.0000 mg | DELAYED_RELEASE_TABLET | Freq: Two times a day (BID) | ORAL | Status: DC
  Administered 2013-12-17 – 2013-12-23 (×11): 325 mg via ORAL
  Filled 2013-12-17 (×14): qty 1

## 2013-12-17 MED ORDER — ALBUTEROL SULFATE (2.5 MG/3ML) 0.083% IN NEBU: 2.5000 mg | INHALATION_SOLUTION | RESPIRATORY_TRACT | Status: DC | PRN

## 2013-12-17 MED ORDER — POTASSIUM CHLORIDE CRYS ER 20 MEQ PO TBCR
40.0000 meq | EXTENDED_RELEASE_TABLET | Freq: Once | ORAL | Status: AC
  Administered 2013-12-17: 40 meq via ORAL
  Filled 2013-12-17: qty 2

## 2013-12-17 MED ORDER — LEVOFLOXACIN 750 MG PO TABS
750.0000 mg | ORAL_TABLET | ORAL | Status: DC
  Administered 2013-12-17 – 2013-12-19 (×2): 750 mg via ORAL
  Filled 2013-12-17 (×3): qty 1

## 2013-12-17 MED ORDER — AMLODIPINE BESYLATE 5 MG PO TABS
5.0000 mg | ORAL_TABLET | Freq: Every day | ORAL | Status: DC
  Filled 2013-12-17: qty 1

## 2013-12-17 NOTE — Progress Notes (Signed)
Physical Therapy Treatment Patient Details Name: [name] MRN: [id number] DOB: 1945-12-18 Today's Date: 12/17/2013    History of Present Illness Pt is a 68 y.o. female s/p right TKA, with history of arthritis of left knee    PT Comments    Patient is slowly progressing with current therapy. Patient's health status is limiting her to progress more towards goals. Patient is currently on 4 L oxygen and continues to have difficultly maintaining a constant therapeutic value. Continue working with patient to increase activity tolerance. If patient is unable to progress, SNF may be required prior to HHPT for additional endurance, strengthening, and functional independence.    Follow Up Recommendations  Home health PT;Supervision - Intermittent     Equipment Recommendations  None recommended by PT    Recommendations for Other Services OT consult     Precautions / Restrictions Precautions Precautions: Knee Required Braces or Orthoses: Knee Immobilizer - Right Knee Immobilizer - Right: On when out of bed or walking Restrictions RLE Weight Bearing: Weight bearing as tolerated LLE Weight Bearing: Weight bearing as tolerated    Mobility  Bed Mobility Overal bed mobility: Needs Assistance Bed Mobility: Sit to Sidelying     Supine to sit: Min assist;HOB elevated     General bed mobility comments: Patient requires min A for RLE with bed mobility and cues to bring upright.  Transfers Overall transfer level: Needs assistance Equipment used: Rolling walker (2 wheeled) Transfers: Sit to/from Stand Sit to Stand: Mod assist         General transfer comment: Patient required mod A to stand upright but required cues for hand placement.  Ambulation/Gait Ambulation/Gait assistance: Min assist Ambulation Distance (Feet): 15 Feet Assistive device: Rolling walker (2 wheeled) Gait Pattern/deviations: Step-to pattern;Decreased stride length;Decreased stance time - left;Decreased  stance time - right;Antalgic;Trunk flexed     General Gait Details: Patient required cues for upright position and gait sequencing.   Stairs            Wheelchair Mobility    Modified Rankin (Stroke Patients Only)       Balance                                    Cognition Arousal/Alertness: Awake/alert Behavior During Therapy: WFL for tasks assessed/performed Overall Cognitive Status: Within Functional Limits for tasks assessed                      Exercises Total Joint Exercises Ankle Circles/Pumps: AROM;Seated;Both;10 reps Quad Sets: Seated;AROM;Right;10 reps Heel Slides: AAROM;Seated;Right;15 reps Hip ABduction/ADduction: AROM;Seated;Right;10 reps    General Comments        Pertinent Vitals/Pain Patient denies pain.    Home Living                      Prior Function            PT Goals (current goals can now be found in the care plan section) Progress towards PT goals: Progressing toward goals    Frequency  7X/week    PT Plan Current plan remains appropriate    Co-evaluation             End of Session Equipment Utilized During Treatment: Gait belt;Right knee immobilizer Activity Tolerance: Patient limited by fatigue Patient left: in chair;with call bell/phone within reach     Time: 1429-1503 PT Time Calculation (min): 34 min  Charges:                       G Codes:      [place], SPTA 12/17/2013, 3:16 PM

## 2013-12-17 NOTE — Progress Notes (Signed)
Placed patient on CPAP at this time. Patient tolerating well. Patient instructed to have nurse call respiratory if any problems arise.

## 2013-12-17 NOTE — Consult Note (Signed)
[facility] Medical Consultation  [name] [id number] DOB: [birth year] DOA: 12/15/2013 PCP: Debra Sheller, MD   Requesting physician: ortho Reason for consultation: SOB  Impression/Recommendations Principal Problem:   Osteoarthritis of left knee Active Problems:   Obstructive sleep apnea   Osteoarthritis of right knee   Acute respiratory failure   SOB (shortness of breath)   Chronic kidney disease (CKD), stage IV (severe)   Diabetes    1. Acute resp failure- not on O2 at home; ? Asthma vs PNA vs CHF- check echo last echo in our system is 2008- has history of diastolic HF, on lasix 0000000 mg TID already + 2.3 L so far this admission; nebs PRN, check 3 view x ray; abx renally dose- hold on IV lasix until after 2 view and echo- Patient follows with Dr. Einar [name]; not currently wheezing- hold on steroids 2. SOB: see above 3. Hypokalemia- replete 4. DM- on lantus and SSI 5. CKD- continue lasix and follow up outpatient- at baseline 6. S/p knee replacement surgery- per primary 7. Fever- abx and incentive spirometry  I will followup again tomorrow. Please contact me if I can be of assistance in the meanwhile. Thank you for this consultation.  Chief Complaint: SOB  HPI:  This is a 68 yo AAF who was admitted for a knee replacement.  After surgery, patient started wheezing and requiring more O2, up to 4L.  It appears that patient has been on 2L since admission but not on O2 at home.  Patient dose have a history of asthma.  She is requiring 4L currently.  She is also + 2.3L of fluid with a history of diastolic heart failure.  + fever, no cough, no chest pain.  No swelling.     Review of Systems:  All systems reviewed, negative unless stated above   Past Medical History  Diagnosis Date  . Atrial flutter   . Hypertension   . Diabetes mellitus   . Diastolic heart failure   . Asthma   . Hyperlipidemia   . Fatty liver   . Esophageal dysmotility   . Arthritis   . Congestive  heart failure   . Sleep apnea     wears CPAP  . Family history of malignant neoplasm of gastrointestinal tract   . Fatty tumor fatty tumor back  . Coronary atherosclerosis of native coronary artery   . Morbid obesity   . Myocardial infarction 2009  . Dysrhythmia     afib,flutter hx  . Heart murmur   . Peripheral vascular disease   . Shortness of breath   . Pneumonia     hx  . GERD (gastroesophageal reflux disease)     barrets esophagus  . Anginal pain     occ; non-ischemic Lexiscan 09/2012  . Kidney disease     CKD stage IV (Dr. Erling [name])   Past Surgical History  Procedure Laterality Date  . Coronary angioplasty with stent placement    . Breast lumpectomy      right  . Tubal ligation    . Tonsillectomy    . Total knee arthroplasty Right 12/15/2013    Procedure: RIGHT TOTAL KNEE ARTHROPLASTY;  Surgeon: Debra Corning, MD;  Location: [facility];  Service: Orthopedics;  Laterality: Right;   Social History:  reports that she has never smoked. She has never used smokeless tobacco. She reports that she does not drink alcohol or use illicit drugs.  Allergies  Allergen Reactions  . Codeine Nausea And Vomiting  . Penicillins  Nausea And Vomiting  . Sulfa Antibiotics Itching and Nausea And Vomiting    "everything I seen was red"  . Other Itching    Adhesive from ekg leads   Family History  Problem Relation Age of Onset  . Heart disease Mother   . Cancer Mother     bladder  . Kidney disease Mother   . Ovarian cancer Daughter   . Stomach cancer Maternal Uncle   . Colon cancer Maternal Aunt   . Esophageal cancer Neg Hx     Prior to Admission medications   Medication Sig Start Date End Date Taking? Authorizing Provider  acetaminophen (TYLENOL) 500 MG tablet Take 500 mg by mouth every 6 (six) hours as needed for mild pain.   Yes Historical Provider, MD  amitriptyline (ELAVIL) 25 MG tablet Take 25 mg by mouth at bedtime.     Yes Historical Provider, MD  amLODipine (NORVASC) 10  MG tablet Take 10 mg by mouth daily.   Yes [name], CMA  aspirin 81 MG tablet Take 81 mg by mouth daily.     Yes Historical Provider, MD  carvedilol (COREG) 25 MG tablet Take 37.5 mg by mouth 2 (two) times daily with a meal.    Yes [name], CMA  cloNIDine (CATAPRES) 0.3 MG tablet Take 0.3 mg by mouth 2 (two) times daily.     Yes Historical Provider, MD  clopidogrel (PLAVIX) 75 MG tablet Take 75 mg by mouth daily.     Yes Historical Provider, MD  dicyclomine (BENTYL) 10 MG capsule Take 10 mg by mouth 4 (four) times daily as needed (IBS).   Yes Historical Provider, MD  esomeprazole (NEXIUM) 40 MG capsule Take 40 mg by mouth daily at 12 noon.   Yes Historical Provider, MD  febuxostat (ULORIC) 40 MG tablet Take 40 mg by mouth daily.    Yes Historical Provider, MD  furosemide (LASIX) 80 MG tablet Take 160 mg by mouth 3 (three) times daily.    Yes Historical Provider, MD  insulin glargine (LANTUS) 100 UNIT/ML injection Inject 50 Units into the skin 2 (two) times daily.    Yes [name], CMA  insulin lispro (HUMALOG) 100 UNIT/ML injection Inject 35 Units into the skin 3 (three) times daily after meals.    Yes [name], CMA  Linaclotide (LINZESS) 145 MCG CAPS capsule Take 290 mcg by mouth daily.   Yes Historical Provider, MD  potassium chloride SA (K-DUR,KLOR-CON) 20 MEQ tablet Take 20 mEq by mouth daily.    Yes Historical Provider, MD  rosuvastatin (CRESTOR) 10 MG tablet Take 10 mg by mouth daily.     Yes Historical Provider, MD  zolpidem (AMBIEN) 10 MG tablet Take 10 mg by mouth at bedtime as needed for sleep.    Yes Historical Provider, MD  albuterol (PROVENTIL HFA;VENTOLIN HFA) 108 (90 BASE) MCG/ACT inhaler Inhale 2 puffs into the lungs every 4 (four) hours as needed for wheezing.     Historical Provider, MD  nitroGLYCERIN (NITROSTAT) 0.4 MG SL tablet Place 0.4 mg under the tongue every 5 (five) minutes as needed for chest pain.     Historical Provider, MD  ranitidine (ZANTAC) 150 MG tablet  Take 1 tablet (150 mg total) by mouth at bedtime. 07/29/13   Debra Craze, NP   Physical Exam: Blood pressure 123/74, pulse 85, temperature 100.6 F (38.1 C), temperature source Oral, resp. rate 20, height 5\' 4"  (1.626 m), weight 109.7 kg (241 lb 13.5 oz), SpO2 93.00%. Filed Vitals:  12/17/13 0830  BP: 123/74  Pulse: 85  Temp: 100.6 F (38.1 C)  Resp:      General:  4L O2, no increased work of breathing  Eyes: wnl  ENT: wnl  Neck: supple  Cardiovascular: rrr  Respiratory: coarse breath sounds, no wheezing  Abdomen: +Bs, soft  Skin: no rashes or lesions noted  Musculoskeletal: left leg in brace  Psychiatric: normal mood/affect  Neurologic: CN 2-12 intact  Labs on Admission:  Basic Metabolic Panel:  Recent Labs Lab 12/15/13 1036 12/16/13 0420 12/17/13 0431  NA 145 144 144  K 3.4* 3.6* 3.2*  CL 105 105 106  CO2 22 23 22   GLUCOSE 143* 183* 145*  BUN 20 19 24*  CREATININE 2.07* 1.96* 2.49*  CALCIUM 9.4 8.6 8.4   Liver Function Tests:  Recent Labs Lab 12/15/13 1036  AST 20  ALT 12  ALKPHOS 60  BILITOT 0.4  PROT 7.4  ALBUMIN 3.7   No results found for this basename: LIPASE, AMYLASE,  in the last 168 hours No results found for this basename: AMMONIA,  in the last 168 hours CBC:  Recent Labs Lab 12/16/13 0420 12/17/13 0431  WBC 12.0* 12.2*  HGB 12.8 11.5*  HCT 38.9 35.4*  MCV 95.1 94.9  PLT 258 204   Cardiac Enzymes: No results found for this basename: CKTOTAL, CKMB, CKMBINDEX, TROPONINI,  in the last 168 hours BNP: No components found with this basename: POCBNP,  CBG:  Recent Labs Lab 12/16/13 1124 12/16/13 1642 12/16/13 2130 12/17/13 0631 12/17/13 1123  GLUCAP 235* 213* 153* 137* 167*    Radiological Exams on Admission: Dg Chest Port 1 View  12/17/2013   CLINICAL DATA:  decreased o2 sat post op  EXAM: PORTABLE CHEST - 1 VIEW  COMPARISON:  DG CHEST 2 VIEW dated 12/09/2013  FINDINGS: The cardiac silhouette is  mild-to-moderately enlarged. Aorta is tortuous and ectatic. There is prominence of the interstitial markings, and peribronchial cuffing. An area of increased density is appreciated within the left lung base. The osseous structures unremarkable.  IMPRESSION: Pulmonary edema  Focal infiltrate left lung base differential considerations are infectious pneumonitis versus asymmetric edema. Repeat surveillance evaluation with two-view chest radiograph is recommended.   Electronically Signed   By: Margaree Mackintosh M.D.   On: 12/17/2013 10:41    EKG: Independently reviewed. Sinus brady   Time spent: 75 min  [facility] Pager [phone] If 7PM-7AM, please contact night-coverage [email or web address] Password [email or web address] 12/17/2013, 1:49 PM

## 2013-12-17 NOTE — Progress Notes (Signed)
Subjective: 2 Days Post-Op Procedure(s) (LRB): RIGHT TOTAL KNEE ARTHROPLASTY (Right) Patient reports pain as 4 on 0-10 scale.   C/o wheezing and flare up of asthma. Taking po/voiding ok. Slow progress with PT  Objective: Vital signs in last 24 hours: Temp:  [98.3 F (36.8 C)-100.6 F (38.1 C)] 100.6 F (38.1 C) (04/15 0830) Pulse Rate:  [60-86] 85 (04/15 0830) Resp:  [15-20] 20 (04/15 0825) BP: (96-134)/(61-76) 123/74 mmHg (04/15 0830) SpO2:  [86 %-100 %] 93 % (04/15 0847)  Intake/Output from previous day: 04/14 0701 - 04/15 0700 In: 1280 [P.O.:480; I.V.:800] Out: 200 [Urine:200] Intake/Output this shift:     Recent Labs  12/16/13 0420 12/17/13 0431  HGB 12.8 11.5*    Recent Labs  12/16/13 0420 12/17/13 0431  WBC 12.0* 12.2*  RBC 4.09 3.73*  HCT 38.9 35.4*  PLT 258 204    Recent Labs  12/16/13 0420 12/17/13 0431  NA 144 144  K 3.6* 3.2*  CL 105 106  CO2 23 22  BUN 19 24*  CREATININE 1.96* 2.49*  GLUCOSE 183* 145*  CALCIUM 8.6 8.4   Right knee exam: Neurovascular intact Sensation intact distally Intact pulses distally Dorsiflexion/Plantar flexion intact Incision: dressing C/D/I Compartment soft Lungs have some wheezing as bilat bases right >Left.  Assessment/Plan: 2 Days Post-Op Procedure(s) (LRB): RIGHT TOTAL KNEE ARTHROPLASTY (Right) Flare up of asthma vs atelectasis Hypokalemia Plan: Chest x ray Resume Ventolin HHN tx's q 6hrs Cont PT Cont ASA 325mg  BID scd's for DVT prophylaxis Discharge home in 1-2 days when breathing issues improved and passes PT  Erlene Senters 12/17/2013, 9:11 AM

## 2013-12-17 NOTE — Progress Notes (Signed)
Seen and agreed 12/17/2013 Tonia Brooms Robinette PTA [phone] pager [phone] office

## 2013-12-17 NOTE — Progress Notes (Signed)
ANTIBIOTIC CONSULT NOTE - INITIAL  Pharmacy Consult for levofloxacin Indication: CAP  Allergies  Allergen Reactions  . Codeine Nausea And Vomiting  . Penicillins Nausea And Vomiting  . Sulfa Antibiotics Itching and Nausea And Vomiting    "everything I seen was red"  . Other Itching    Adhesive from ekg leads    Patient Measurements: Height: 5\' 4"  (162.6 cm) Weight: 241 lb 13.5 oz (109.7 kg) IBW/kg (Calculated) : 54.7  Vital Signs: Temp: 100.6 F (38.1 C) (04/15 0830) Temp src: Oral (04/15 0830) BP: 123/74 mmHg (04/15 0830) Pulse Rate: 85 (04/15 0830) Intake/Output from previous day: 04/14 0701 - 04/15 0700 In: 1280 [P.O.:480; I.V.:800] Out: 200 [Urine:200] Intake/Output from this shift: Total I/O In: 120 [P.O.:120] Out: -   Labs:  Recent Labs  12/15/13 1036 12/16/13 0420 12/17/13 0431  WBC  --  12.0* 12.2*  HGB  --  12.8 11.5*  PLT  --  258 204  CREATININE 2.07* 1.96* 2.49*   Estimated Creatinine Clearance: 26.5 ml/min (by C-G formula based on Cr of 2.49). No results found for this basename: VANCOTROUGH, Corlis Leak, VANCORANDOM, GENTTROUGH, GENTPEAK, GENTRANDOM, TOBRATROUGH, TOBRAPEAK, TOBRARND, AMIKACINPEAK, AMIKACINTROU, AMIKACIN,  in the last 72 hours   Microbiology: Recent Results (from the past 720 hour(s))  SURGICAL PCR SCREEN     Status: None   Collection Time    12/09/13  1:11 PM      Result Value Ref Range Status   MRSA, PCR NEGATIVE  NEGATIVE Final   Staphylococcus aureus NEGATIVE  NEGATIVE Final   Comment:            The Xpert SA Assay (FDA     approved for NASAL specimens     in patients over 12 years of age),     is one component of     a comprehensive surveillance     program.  Test performance has     been validated by [employer] for patients greater     than or equal to 51 year old.     It is not intended     to diagnose infection nor to     guide or monitor treatment.    Medical History: Past Medical History  Diagnosis  Date  . Atrial flutter   . Hypertension   . Diabetes mellitus   . Diastolic heart failure   . Asthma   . Hyperlipidemia   . Fatty liver   . Esophageal dysmotility   . Arthritis   . Congestive heart failure   . Sleep apnea     wears CPAP  . Family history of malignant neoplasm of gastrointestinal tract   . Fatty tumor fatty tumor back  . Coronary atherosclerosis of native coronary artery   . Morbid obesity   . Myocardial infarction 2009  . Dysrhythmia     afib,flutter hx  . Heart murmur   . Peripheral vascular disease   . Shortness of breath   . Pneumonia     hx  . GERD (gastroesophageal reflux disease)     barrets esophagus  . Anginal pain     occ; non-ischemic Lexiscan 09/2012  . Kidney disease     CKD stage IV (Dr. Erling Cruz)    Assessment: 68 YOF s/p R TKA who is having wheezing. WBC increased at 12.2 and Tmax/24h 100.6.  SCr 2.49 with est CrCl ~60mL/min- do not have labs prior to this month, so unsure if this is her true  baseline or if she is experiencing ARF.   Goal of Therapy:  eradication of infection and proper dosing adjustments  Plan:  1. Levofloxacin 750mg  PO q48h 2. Follow renal function, LOT, clinical progression  Ziah Turvey D. Anaily Ashbaugh, PharmD, BCPS Clinical Pharmacist Pager: [phone] 12/17/2013 1:15 PM

## 2013-12-17 NOTE — Progress Notes (Signed)
Physical Therapy Treatment Patient Details Name: [name] MRN: [id number] DOB: 26-Aug-1946 Today's Date: 12/17/2013    History of Present Illness Pt is a 68 y.o. female s/p right TKA, with history of arthritis of left knee    PT Comments    Patient is slowly progressing towards goals. Patient appears motivated to return home but is unable to tolerate intense therapy due to fatigue. Recommend plan of care with efforts to d/c to HHPT. At this time patient requires A with activity and safety.     Follow Up Recommendations  Home health PT;Supervision - Intermittent     Equipment Recommendations  None recommended by PT    Recommendations for Other Services       Precautions / Restrictions Precautions Precautions: Knee Required Braces or Orthoses: Knee Immobilizer - Right Knee Immobilizer - Right: On when out of bed or walking Restrictions RLE Weight Bearing: Weight bearing as tolerated LLE Weight Bearing: Weight bearing as tolerated    Mobility  Bed Mobility Overal bed mobility: Needs Assistance Bed Mobility: Sit to Sidelying     Supine to sit: Min assist;HOB elevated     General bed mobility comments: Patient requires min A for RLE with bed mobility and cues to bring upright.  Transfers Overall transfer level: Needs assistance Equipment used: Rolling walker (2 wheeled) Transfers: Sit to/from Stand Sit to Stand: Mod assist         General transfer comment: Patient required mod A to stand upright but required cues for hand placement.  Ambulation/Gait Ambulation/Gait assistance: Min assist Ambulation Distance (Feet): 15 Feet Assistive device: Rolling walker (2 wheeled) Gait Pattern/deviations: Step-to pattern;Decreased stride length;Decreased stance time - left;Decreased stance time - right;Antalgic;Trunk flexed     General Gait Details: Patient required cues for upright position and gait sequencing.   Stairs            Wheelchair Mobility     Modified Rankin (Stroke Patients Only)       Balance                                    Cognition Arousal/Alertness: Awake/alert Behavior During Therapy: WFL for tasks assessed/performed Overall Cognitive Status: Within Functional Limits for tasks assessed                      Exercises Total Joint Exercises Ankle Circles/Pumps: AROM;Both;Supine;10 reps Quad Sets: AROM;Supine;10 reps;Right Heel Slides: AAROM;Right;10 reps;Supine Hip ABduction/ADduction: AROM;Right;5 reps;Supine    General Comments        Pertinent Vitals/Pain Patient denies pain.    Home Living                      Prior Function            PT Goals (current goals can now be found in the care plan section) Progress towards PT goals: Progressing toward goals    Frequency  7X/week    PT Plan Current plan remains appropriate    Co-evaluation             End of Session Equipment Utilized During Treatment: Gait belt;Right knee immobilizer Activity Tolerance: Patient limited by fatigue Patient left: in chair;with call bell/phone within reach     Time: 1131-1206 PT Time Calculation (min): 35 min  Charges:  G Codes:      [place] L Jernee Murtaugh, SPTA 12/17/2013, 2:31 PM

## 2013-12-17 NOTE — Progress Notes (Signed)
Seen and agreed 12/17/2013 Tonia Brooms Gavin Telford PTA [phone] pager [phone] office

## 2013-12-17 NOTE — Discharge Instructions (Signed)
Total Knee Replacement Care After Refer to this sheet in the next few weeks. These instructions provide you with information on caring for yourself after your procedure. Your caregiver also may give you specific instructions. Your treatment has been planned according to the most current medical practices, but problems sometimes occur. Call your caregiver if you have any problems or questions after your procedure. HOME CARE INSTRUCTIONS   See a physical therapist as directed by your caregiver.  Take over-the-counter or prescription medicines for pain, discomfort, or fever only as directed by your caregiver.  Avoid lifting or driving until you are instructed otherwise.  If you have been sent home with a continuous passive motion machine, use it as directed by your caregiver. SEEK MEDICAL CARE IF:  You have difficulty breathing.  Your wound is red, swollen, or has become increasingly painful.  You have pus draining from your wound.  You have a bad smell coming from your wound.  You have persistent bleeding from your wound.  Your wound breaks open after sutures (stitches) or staples have been removed. SEEK IMMEDIATE MEDICAL CARE IF:   You have a fever.  You have a rash.  You have pain or swelling in your calf or thigh.  You have shortness of breath or chest pain.  Your range of motion in your knee is decreasing rather than increasing. MAKE SURE YOU:   Understand these instructions.  Will watch your condition.  Will get help right away if you are not doing well or get worse. Document Released: 03/10/2005 Document Revised: 02/20/2012 Document Reviewed: 10/10/2011 [facility] Patient Information 2014 [employer].

## 2013-12-17 NOTE — Progress Notes (Signed)
Seen and agreed 12/17/2013 Tonia Brooms Robinette PTA [phone] pager [phone] office

## 2013-12-18 ENCOUNTER — Encounter (HOSPITAL_COMMUNITY): Payer: Self-pay | Admitting: General Practice

## 2013-12-18 DIAGNOSIS — R0989 Other specified symptoms and signs involving the circulatory and respiratory systems: Secondary | ICD-10-CM

## 2013-12-18 DIAGNOSIS — R0609 Other forms of dyspnea: Secondary | ICD-10-CM

## 2013-12-18 LAB — CBC
HCT: 34.3 % — ABNORMAL LOW (ref 36.0–46.0)
Hemoglobin: 11.2 g/dL — ABNORMAL LOW (ref 12.0–15.0)
MCH: 30.9 pg (ref 26.0–34.0)
MCHC: 32.7 g/dL (ref 30.0–36.0)
MCV: 94.5 fL (ref 78.0–100.0)
Platelets: 187 10*3/uL (ref 150–400)
RBC: 3.63 MIL/uL — ABNORMAL LOW (ref 3.87–5.11)
RDW: 15.7 % — ABNORMAL HIGH (ref 11.5–15.5)
WBC: 13.6 10*3/uL — ABNORMAL HIGH (ref 4.0–10.5)

## 2013-12-18 LAB — BLOOD GAS, ARTERIAL
Acid-base deficit: 0.4 mmol/L (ref 0.0–2.0)
Bicarbonate: 23.1 mEq/L (ref 20.0–24.0)
Drawn by: 277331
FIO2: 1 %
O2 Saturation: 96.9 %
Patient temperature: 99.5
TCO2: 24.1 mmol/L (ref 0–100)
pCO2 arterial: 34.5 mmHg — ABNORMAL LOW (ref 35.0–45.0)
pH, Arterial: 7.443 (ref 7.350–7.450)
pO2, Arterial: 91.3 mmHg (ref 80.0–100.0)

## 2013-12-18 LAB — GLUCOSE, CAPILLARY
Glucose-Capillary: 146 mg/dL — ABNORMAL HIGH (ref 70–99)
Glucose-Capillary: 170 mg/dL — ABNORMAL HIGH (ref 70–99)
Glucose-Capillary: 135 mg/dL — ABNORMAL HIGH (ref 70–99)

## 2013-12-18 LAB — BASIC METABOLIC PANEL
BUN: 31 mg/dL — ABNORMAL HIGH (ref 6–23)
CO2: 22 mEq/L (ref 19–32)
Calcium: 8.7 mg/dL (ref 8.4–10.5)
Chloride: 102 mEq/L (ref 96–112)
Creatinine, Ser: 2.84 mg/dL — ABNORMAL HIGH (ref 0.50–1.10)
GFR calc Af Amer: 19 mL/min — ABNORMAL LOW (ref >90)
GFR calc non Af Amer: 16 mL/min — ABNORMAL LOW (ref >90)
Glucose, Bld: 250 mg/dL — ABNORMAL HIGH (ref 70–99)
Potassium: 3.7 mEq/L (ref 3.7–5.3)
Sodium: 141 mEq/L (ref 137–147)

## 2013-12-18 MED ORDER — ALBUTEROL SULFATE (2.5 MG/3ML) 0.083% IN NEBU: 2.5000 mg | INHALATION_SOLUTION | Freq: Three times a day (TID) | RESPIRATORY_TRACT | Status: DC

## 2013-12-18 MED ORDER — CARVEDILOL 3.125 MG PO TABS
3.1250 mg | ORAL_TABLET | Freq: Two times a day (BID) | ORAL | Status: DC
  Administered 2013-12-19 – 2013-12-23 (×9): 3.125 mg via ORAL
  Filled 2013-12-18 (×14): qty 1

## 2013-12-18 MED ORDER — FUROSEMIDE 10 MG/ML IJ SOLN
100.0000 mg | Freq: Three times a day (TID) | INTRAVENOUS | Status: DC
  Administered 2013-12-18: 100 mg via INTRAVENOUS
  Filled 2013-12-18 (×3): qty 10

## 2013-12-18 MED ORDER — ALBUTEROL SULFATE (2.5 MG/3ML) 0.083% IN NEBU
2.5000 mg | INHALATION_SOLUTION | Freq: Four times a day (QID) | RESPIRATORY_TRACT | Status: DC
  Administered 2013-12-18 – 2013-12-23 (×17): 2.5 mg via RESPIRATORY_TRACT
  Filled 2013-12-18 (×19): qty 3

## 2013-12-18 MED ORDER — BISACODYL 10 MG RE SUPP: 10.0000 mg | Freq: Every day | RECTAL | Status: DC | PRN

## 2013-12-18 MED ORDER — ENOXAPARIN SODIUM 120 MG/0.8ML ~~LOC~~ SOLN
115.0000 mg | SUBCUTANEOUS | Status: DC
  Administered 2013-12-18: 115 mg via SUBCUTANEOUS
  Filled 2013-12-18 (×2): qty 0.8

## 2013-12-18 MED ORDER — ALBUTEROL SULFATE (2.5 MG/3ML) 0.083% IN NEBU
2.5000 mg | INHALATION_SOLUTION | Freq: Four times a day (QID) | RESPIRATORY_TRACT | Status: DC | PRN
  Filled 2013-12-18 (×2): qty 3

## 2013-12-18 MED ORDER — INSULIN GLARGINE 100 UNIT/ML ~~LOC~~ SOLN
15.0000 [IU] | Freq: Every day | SUBCUTANEOUS | Status: DC
  Administered 2013-12-19 – 2013-12-20 (×2): 15 [IU] via SUBCUTANEOUS
  Filled 2013-12-18 (×3): qty 0.15

## 2013-12-18 MED ORDER — FUROSEMIDE 10 MG/ML IJ SOLN
160.0000 mg | Freq: Three times a day (TID) | INTRAMUSCULAR | Status: DC
  Administered 2013-12-18 – 2013-12-20 (×5): 160 mg via INTRAVENOUS
  Filled 2013-12-18 (×6): qty 16

## 2013-12-18 NOTE — Progress Notes (Signed)
Orthopedic Tech Progress Note Patient Details:  [name] November 26, 1945 [id number] CPM applied for 1500 rounds CPM Right Knee CPM Right Knee: On Right Knee Flexion (Degrees): 60 Right Knee Extension (Degrees): 0 Additional Comments: put ohf on bed   [place] R Thompson 12/18/2013, 3:40 PM

## 2013-12-18 NOTE — Progress Notes (Signed)
Physical Therapy Treatment Patient Details Name: [name] MRN: [id number] DOB: 1946/03/28 Today's Date: 12/18/2013    History of Present Illness Pt is a 68 y.o. female s/p right TKA, with history of arthritis of left knee    PT Comments    Patient unable to progress towards goals due to fatigue and complaints of shortness of breath. Patient currently on 4 liters of oxygen but continues to drop in oxygen saturation during therex. Patient requires cues for safety and extra time for processing. Patient would benefit from SNF to help progress therapuetically while her health status can be monitored.    Follow Up Recommendations  SNF     Equipment Recommendations       Recommendations for Other Services       Precautions / Restrictions Precautions Precautions: Knee Required Braces or Orthoses: Knee Immobilizer - Right Knee Immobilizer - Right: On when out of bed or walking Restrictions RLE Weight Bearing: Weight bearing as tolerated LLE Weight Bearing: Weight bearing as tolerated    Mobility  Bed Mobility Overal bed mobility: Needs Assistance Bed Mobility: Sit to Sidelying         Sit to sidelying: Min assist;HOB elevated General bed mobility comments: Patient requires min A for RLE with bed mobility and cues to position in supine.  Transfers Overall transfer level: Needs assistance Equipment used: Rolling walker (2 wheeled) Transfers: Sit to/from Stand Sit to Stand: Mod assist;+2 physical assistance         General transfer comment: Patient required mod A to +2 for safety with sit to stand LOB x 1. Patient required cues to stand upright. SIt to stand x 4  Ambulation/Gait Ambulation/Gait assistance: Min assist Ambulation Distance (Feet): 10 Feet Assistive device: Rolling walker (2 wheeled)   Gait velocity: Very slow   General Gait Details: Patient required cues for upright position. Patient has difficulty sequencing gait with RW and is slow to  process.   Stairs            Wheelchair Mobility    Modified Rankin (Stroke Patients Only)       Balance                                    Cognition Arousal/Alertness: Lethargic Behavior During Therapy: WFL for tasks assessed/performed Overall Cognitive Status: Within Functional Limits for tasks assessed                      Exercises Total Joint Exercises Ankle Circles/Pumps: AROM;Seated;Both;10 reps Quad Sets: Seated;AROM;Right;10 reps Heel Slides: AAROM;Seated;Right;15 reps Hip ABduction/ADduction: AROM;Seated;Right;10 reps    General Comments        Pertinent Vitals/Pain Patient denies pain. Patient c/o of shortness of breath. Patient was instructed on proper breathing and frequent breaks throughout therapy session.    Home Living                      Prior Function            PT Goals (current goals can now be found in the care plan section) Progress towards PT goals: Not progressing toward goals - comment (Patient unable to progress due shortness of breath)    Frequency       PT Plan Discharge plan needs to be updated    Co-evaluation             End of Session  Equipment Utilized During Treatment: Gait belt;Right knee immobilizer;Oxygen Activity Tolerance: Patient limited by fatigue Patient left: in bed;with call bell/phone within reach     Time: 1029-1120 PT Time Calculation (min): 51 min  Charges:                       G Codes:      [place], SPTA 12/18/2013, 12:10 PM

## 2013-12-18 NOTE — Progress Notes (Signed)
ANTICOAGULATION CONSULT NOTE - Initial Consult  Pharmacy Consult for Lovenox Indication: R/O PE  Allergies  Allergen Reactions  . Codeine Nausea And Vomiting  . Penicillins Nausea And Vomiting  . Sulfa Antibiotics Itching and Nausea And Vomiting    "everything I seen was red"  . Other Itching    Adhesive from ekg leads    Patient Measurements: Height: 5\' 4"  (162.6 cm) Weight: 254 lb 1.6 oz (115.259 kg) IBW/kg (Calculated) : 54.7 Heparin Dosing Weight:   Vital Signs: Temp: 99.5 F (37.5 C) (04/16 1614) Temp src: Oral (04/16 1614) BP: 167/71 mmHg (04/16 1711) Pulse Rate: 92 (04/16 1711)  Labs:  Recent Labs  12/16/13 0420 12/17/13 0431 12/18/13 0456 12/18/13 0914  HGB 12.8 11.5* 11.2*  --   HCT 38.9 35.4* 34.3*  --   PLT 258 204 187  --   CREATININE 1.96* 2.49*  --  2.84*    Estimated Creatinine Clearance: 23.9 ml/min (by C-G formula based on Cr of 2.84).   Medical History: Past Medical History  Diagnosis Date  . Atrial flutter   . Hypertension   . Diabetes mellitus   . Diastolic heart failure   . Asthma   . Hyperlipidemia   . Fatty liver   . Esophageal dysmotility   . Arthritis   . Congestive heart failure   . Sleep apnea     wears CPAP  . Family history of malignant neoplasm of gastrointestinal tract   . Fatty tumor fatty tumor back  . Coronary atherosclerosis of native coronary artery   . Morbid obesity   . Myocardial infarction 2009  . Dysrhythmia     afib,flutter hx  . Heart murmur   . Peripheral vascular disease   . Shortness of breath   . Pneumonia     hx  . GERD (gastroesophageal reflux disease)     barrets esophagus  . Anginal pain     occ; non-ischemic Lexiscan 09/2012  . Kidney disease     CKD stage IV (Dr. Erling Cruz)    Medications:  Scheduled:  . albuterol  2.5 mg Nebulization Q6H  . amitriptyline  25 mg Oral QHS  . aspirin EC  325 mg Oral BID  . atorvastatin  20 mg Oral q1800  . carvedilol  3.125 mg Oral BID WC  .  clopidogrel  75 mg Oral Q breakfast  . docusate sodium  100 mg Oral BID  . enoxaparin (LOVENOX) injection  115 mg Subcutaneous Q24H  . febuxostat  40 mg Oral Daily  . furosemide  100 mg Intravenous 3 times per day  . insulin aspart  0-15 Units Subcutaneous TID WC  . [START ON 12/19/2013] insulin glargine  15 Units Subcutaneous Daily  . levofloxacin  750 mg Oral Q48H  . Linaclotide  290 mcg Oral Daily  . pantoprazole  40 mg Oral Daily  . potassium chloride SA  20 mEq Oral BID    Assessment: 68yo female with CKD-IV, to start Lovenox for R/O PE.  Estimated CrCl < 30 requiring dosage adjustment for renal dysfunction.  Hg 11.2 and Pltc 187 this AM.  No bleeding problems noted.  Currently unable to do V/Q scan 2nd volume overload.  Goal of Therapy:  Anti-Xa level 0.6-1 units/ml 4hrs after LMWH dose given Monitor platelets by anticoagulation protocol: Yes   Plan:  1-  Lovenox 115mg  SQ q24 2-  CBC q72  Gracy Bruins, PharmD Clinical Pharmacist [facility]

## 2013-12-18 NOTE — Significant Event (Signed)
Rapid Response Event Note  Overview:n  Called to assist with patient with sudden onset resp distress. Time Called: 1634 Arrival Time: 1637 Event Type: Respiratory  Initial Focused Assessment:  On arrival patient supine in bed. Skin warm and dry.  Alert - follows commands - able to speak broken sentences.  Denies pain but states she cannot get enough air.  Using accessory muscles to breathe.  Bil BS present - expiratory wheezing bilateral - crackles bilateral - worse on left.  O2 sats 92% on NRB mask.  BP 158/68 HR 93 RR 32.  Abd large and firm - patient denies pain - RN reports no BM in several days.  RN Levada Dy states patient has had several episodes of desaturations today with activity but usually recovered within 5 minutes.  This time did not recover until NRB placed.  RT present.     Interventions:  Nebulizer treatment given.  Repositioned in bed.  Lasix 100 mg IV infusing now - had 160mg  Lasix po this AM.  2D Echo just completed.  ABG drawn.  Called for BiPAP machine.  Dr. Eliseo Squires present.  Update given. Patient placed on BiPap per RT.  Tol well.  WOB decreasing within 15 minutes.  HR remains stable.  O2 sats now 100% on 60% Bipap - see Resp therapy flowsheet.  Placed on telemetry.  IV left arm retaped - site ok.  Transferred to 3S04 with bipap - tol well.  RR 24 - no distress.  Talking in full sentences without distress.  Abd remains firm - no pain - Dr. Eliseo Squires aware.  Handoff to [employer].    Event Summary: Name of Physician Notified: Dr. Eliseo Squires at  (pta RRT)    at    Outcome: Transferred (Comment) [phone])  Event End Time: [place]

## 2013-12-18 NOTE — Progress Notes (Addendum)
IV infiltrated and dc'd intact, site unremarkable,  two nurses assessed arms and no veins seen.  IV therapy notified. Continue to watch.

## 2013-12-18 NOTE — Progress Notes (Signed)
Subjective: 3 Days Post-Op Procedure(s) (LRB): RIGHT TOTAL KNEE ARTHROPLASTY (Right) Patient reports pain as 4 on 0-10 scale.  Slow progress with physical therapy.  Complains of significant shortness of breath.  We appreciate Dr.Vann's  Assistance with her medical care.  I talked to the patient at length and she may want to consider a skilled nursing facility.  She overall is making slow progress hindered by her pulmonary issues.   Objective: Vital signs in last 24 hours: Temp:  [98.4 F (36.9 C)-99.8 F (37.7 C)] 98.6 F (37 C) (04/16 0727) Pulse Rate:  [71-84] 76 (04/16 0727) Resp:  [18-24] 20 (04/16 0727) BP: (101-142)/(59-65) 101/59 mmHg (04/16 0727) SpO2:  [91 %-97 %] 91 % (04/16 0727) Weight:  [115.259 kg (254 lb 1.6 oz)] 115.259 kg (254 lb 1.6 oz) (04/15 1900)  Intake/Output from previous day: 04/15 0701 - 04/16 0700 In: 360 [P.O.:360] Out: 800 [Urine:800] Intake/Output this shift:     Recent Labs  12/16/13 0420 12/17/13 0431 12/18/13 0456  HGB 12.8 11.5* 11.2*    Recent Labs  12/17/13 0431 12/18/13 0456  WBC 12.2* 13.6*  RBC 3.73* 3.63*  HCT 35.4* 34.3*  PLT 204 187    Recent Labs  12/16/13 0420 12/17/13 0431  NA 144 144  K 3.6* 3.2*  CL 105 106  CO2 23 22  BUN 19 24*  CREATININE 1.96* 2.49*  GLUCOSE 183* 145*  CALCIUM 8.6 8.4  right knee exam: Neurovascular intact Sensation intact distally Intact pulses distally Dorsiflexion/Plantar flexion intact Incision: dressing C/D/I Compartment soft Lungs show crackles at bases bilaterally.  Assessment/Plan: 3 Days Post-Op Procedure(s) (LRB): RIGHT TOTAL KNEE ARTHROPLASTY (Right) Significant pulmonary issues with acute respiratory failure and shortness of breath.  Managed by hospitalists. Plan: Continue aspirin 325 mg twice a day with SCDs. Continue medical care. With her pulmonary and medical issues she probably would be a good candidate for SNF at discharge. Up with therapy  [name] 12/18/2013, 10:08 AM

## 2013-12-18 NOTE — Progress Notes (Signed)
PROGRESS NOTE  [name] [id number] DOB: 1946-03-31 DOA: 12/15/2013 PCP: [name], MD  Assessment/Plan: 1. Acute resp failure- not on O2 at home; ? Asthma vs PNA vs CHF- chest x ray suggestive of pulmonary edema: check echo: last echo in our system is 2008- has history of diastolic HF, on lasix 0000000 mg TID already + 2.3 L so far this admission- will change to IV lasix; nebs PRN,   abx renally dose Patient follows with Dr. Einar Gip; not currently wheezing- hold on steroids 2. SOB: see above 3. Hypokalemia- replete 4. DM- on lantus and SSI 5. CKD- continue lasix and follow up outpatient-  Baseline CR in the 2s 6. S/p knee replacement surgery- per primary 7. Fever- abx and incentive spirometry   Code Status: full Family Communication: patient Disposition Plan: consult    Procedures:  S/p knee replacement  Antibiotics:  levaquin  HPI/Subjective: Did not sleep well last PM  Objective: Filed Vitals:   12/18/13 0727  BP: 101/59  Pulse: 76  Temp: 98.6 F (37 C)  Resp: 20    Intake/Output Summary (Last 24 hours) at 12/18/13 1029 Last data filed at 12/18/13 0554  Gross per 24 hour  Intake    240 ml  Output    800 ml  Net   -560 ml   Filed Weights   12/15/13 1046 12/17/13 1900  Weight: 109.7 kg (241 lb 13.5 oz) 115.259 kg (254 lb 1.6 oz)    Exam:   General:  A+Ox3, NAD  Cardiovascular: rrr  Respiratory: crackles throughout  Abdomen: +BS, soft  Musculoskeletal: right leg in brace   Data Reviewed: Basic Metabolic Panel:  Recent Labs Lab 12/15/13 1036 12/16/13 0420 12/17/13 0431 12/18/13 0914  NA 145 144 144 141  K 3.4* 3.6* 3.2* 3.7  CL 105 105 106 102  CO2 22 23 22 22   GLUCOSE 143* 183* 145* 250*  BUN 20 19 24* 31*  CREATININE 2.07* 1.96* 2.49* 2.84*  CALCIUM 9.4 8.6 8.4 8.7   Liver Function Tests:  Recent Labs Lab 12/15/13 1036  AST 20  ALT 12  ALKPHOS 60  BILITOT 0.4  PROT 7.4  ALBUMIN 3.7   No results found for this  basename: LIPASE, AMYLASE,  in the last 168 hours No results found for this basename: AMMONIA,  in the last 168 hours CBC:  Recent Labs Lab 12/16/13 0420 12/17/13 0431 12/18/13 0456  WBC 12.0* 12.2* 13.6*  HGB 12.8 11.5* 11.2*  HCT 38.9 35.4* 34.3*  MCV 95.1 94.9 94.5  PLT 258 204 187   Cardiac Enzymes: No results found for this basename: CKTOTAL, CKMB, CKMBINDEX, TROPONINI,  in the last 168 hours BNP (last 3 results) No results found for this basename: PROBNP,  in the last 8760 hours CBG:  Recent Labs Lab 12/17/13 0631 12/17/13 1123 12/17/13 1620 12/17/13 2133 12/18/13 0628  GLUCAP 137* 167* 162* 141* 146*    Recent Results (from the past 240 hour(s))  SURGICAL PCR SCREEN     Status: None   Collection Time    12/09/13  1:11 PM      Result Value Ref Range Status   MRSA, PCR NEGATIVE  NEGATIVE Final   Staphylococcus aureus NEGATIVE  NEGATIVE Final   Comment:            The Xpert SA Assay (FDA     approved for NASAL specimens     in patients over 62 years of age),     is one component of  a comprehensive surveillance     program.  Test performance has     been validated by [facility] for patients greater     than or equal to 43 year old.     It is not intended     to diagnose infection nor to     guide or monitor treatment.     Studies: Dg Chest 2 View  12/17/2013   CLINICAL DATA:  Shortness of breath, hypoxia  EXAM: CHEST  2 VIEW  COMPARISON:  DG CHEST 1V PORT dated 12/17/2013  FINDINGS: There are bilateral interstitial and alveolar airspace opacities. There is no pleural effusion or pneumothorax. Stable cardiomediastinal silhouette. Unremarkable osseous structures.  IMPRESSION: Overall findings concerning for pulmonary edema.   Electronically Signed   By: Kathreen Devoid   On: 12/17/2013 21:15   Dg Chest Port 1 View  12/17/2013   CLINICAL DATA:  decreased o2 sat post op  EXAM: PORTABLE CHEST - 1 VIEW  COMPARISON:  DG CHEST 2 VIEW dated 12/09/2013  FINDINGS:  The cardiac silhouette is mild-to-moderately enlarged. Aorta is tortuous and ectatic. There is prominence of the interstitial markings, and peribronchial cuffing. An area of increased density is appreciated within the left lung base. The osseous structures unremarkable.  IMPRESSION: Pulmonary edema  Focal infiltrate left lung base differential considerations are infectious pneumonitis versus asymmetric edema. Repeat surveillance evaluation with two-view chest radiograph is recommended.   Electronically Signed   By: Margaree Mackintosh M.D.   On: 12/17/2013 10:41    Scheduled Meds: . amitriptyline  25 mg Oral QHS  . aspirin EC  325 mg Oral BID  . atorvastatin  20 mg Oral q1800  . carvedilol  3.125 mg Oral BID WC  . clopidogrel  75 mg Oral Q breakfast  . docusate sodium  100 mg Oral BID  . febuxostat  40 mg Oral Daily  . furosemide  100 mg Intravenous 3 times per day  . insulin aspart  0-15 Units Subcutaneous TID WC  . insulin glargine  30 Units Subcutaneous BID  . levofloxacin  750 mg Oral Q48H  . Linaclotide  290 mcg Oral Daily  . pantoprazole  40 mg Oral Daily  . potassium chloride SA  20 mEq Oral BID   Continuous Infusions: . sodium chloride 100 mL/hr at 12/16/13 0320   Antibiotics Given (last 72 hours)   Date/Time Action Medication Dose Rate   12/15/13 1240 Given   clindamycin (CLEOCIN) IVPB 900 mg 900 mg    12/15/13 1819 Given   clindamycin (CLEOCIN) IVPB 600 mg 600 mg 100 mL/hr   12/16/13 0025 Given   clindamycin (CLEOCIN) IVPB 600 mg 600 mg 100 mL/hr   12/17/13 1553 Given   levofloxacin (LEVAQUIN) tablet 750 mg 750 mg       Principal Problem:   Osteoarthritis of left knee Active Problems:   Obstructive sleep apnea   Osteoarthritis of right knee   Acute respiratory failure   SOB (shortness of breath)   Chronic kidney disease (CKD), stage IV (severe)   Diabetes    Time spent: 35 min    [place] [facility] Pager [phone]. If 7PM-7AM, please contact  night-coverage at [email or web address], password [facility] 12/18/2013, 10:29 AM  LOS: 3 days

## 2013-12-18 NOTE — Progress Notes (Signed)
  Echocardiogram 2D Echocardiogram has been performed.  Donata Clay 12/18/2013, 2:02 PM

## 2013-12-18 NOTE — Progress Notes (Addendum)
Called back to floor as patient de-sated when she was placed on bedpan-  Check duplex LE-- can not do CTA due to renal function and V/Q not able currently as volume overloaded lovenox  Per pharmacy IV lasix increase- has diuresed about 1 L thus far today Bipap ABG ok for now Will transfer to SDU for continued monitoring Will assume care from ortho lantus at half dose  [place]

## 2013-12-19 DIAGNOSIS — I5031 Acute diastolic (congestive) heart failure: Secondary | ICD-10-CM

## 2013-12-19 DIAGNOSIS — M7989 Other specified soft tissue disorders: Secondary | ICD-10-CM

## 2013-12-19 LAB — BASIC METABOLIC PANEL
BUN: 33 mg/dL — ABNORMAL HIGH (ref 6–23)
CO2: 24 mEq/L (ref 19–32)
Calcium: 8.9 mg/dL (ref 8.4–10.5)
Chloride: 106 mEq/L (ref 96–112)
Creatinine, Ser: 2.82 mg/dL — ABNORMAL HIGH (ref 0.50–1.10)
GFR calc Af Amer: 19 mL/min — ABNORMAL LOW (ref >90)
GFR calc non Af Amer: 16 mL/min — ABNORMAL LOW (ref >90)
Glucose, Bld: 136 mg/dL — ABNORMAL HIGH (ref 70–99)
Potassium: 3.3 mEq/L — ABNORMAL LOW (ref 3.7–5.3)
Sodium: 145 mEq/L (ref 137–147)

## 2013-12-19 LAB — GLUCOSE, CAPILLARY
Glucose-Capillary: 145 mg/dL — ABNORMAL HIGH (ref 70–99)
Glucose-Capillary: 135 mg/dL — ABNORMAL HIGH (ref 70–99)
Glucose-Capillary: 237 mg/dL — ABNORMAL HIGH (ref 70–99)
Glucose-Capillary: 286 mg/dL — ABNORMAL HIGH (ref 70–99)
Glucose-Capillary: 280 mg/dL — ABNORMAL HIGH (ref 70–99)
Glucose-Capillary: 116 mg/dL — ABNORMAL HIGH (ref 70–99)

## 2013-12-19 LAB — CBC
HCT: 34.4 % — ABNORMAL LOW (ref 36.0–46.0)
Hemoglobin: 11.1 g/dL — ABNORMAL LOW (ref 12.0–15.0)
MCH: 30.8 pg (ref 26.0–34.0)
MCHC: 32.3 g/dL (ref 30.0–36.0)
MCV: 95.6 fL (ref 78.0–100.0)
Platelets: 200 10*3/uL (ref 150–400)
RBC: 3.6 MIL/uL — ABNORMAL LOW (ref 3.87–5.11)
RDW: 15.7 % — ABNORMAL HIGH (ref 11.5–15.5)
WBC: 10.3 10*3/uL (ref 4.0–10.5)

## 2013-12-19 MED ORDER — INSULIN ASPART 100 UNIT/ML ~~LOC~~ SOLN
0.0000 [IU] | Freq: Every day | SUBCUTANEOUS | Status: DC
  Administered 2013-12-20 – 2013-12-21 (×2): 2 [IU] via SUBCUTANEOUS

## 2013-12-19 MED ORDER — POTASSIUM CHLORIDE CRYS ER 20 MEQ PO TBCR
40.0000 meq | EXTENDED_RELEASE_TABLET | Freq: Once | ORAL | Status: AC
  Administered 2013-12-19: 40 meq via ORAL
  Filled 2013-12-19: qty 2

## 2013-12-19 MED ORDER — INSULIN ASPART 100 UNIT/ML ~~LOC~~ SOLN
0.0000 [IU] | Freq: Three times a day (TID) | SUBCUTANEOUS | Status: DC
  Administered 2013-12-19 (×2): 8 [IU] via SUBCUTANEOUS

## 2013-12-19 MED ORDER — HEPARIN SODIUM (PORCINE) 5000 UNIT/ML IJ SOLN
5000.0000 [IU] | Freq: Three times a day (TID) | INTRAMUSCULAR | Status: DC
  Administered 2013-12-19 – 2013-12-22 (×8): 5000 [IU] via SUBCUTANEOUS
  Filled 2013-12-19 (×13): qty 1

## 2013-12-19 NOTE — Progress Notes (Signed)
Physical Therapy Treatment Patient Details Name: [name] MRN: [id number] DOB: 1946/07/10 Today's Date: 12/19/2013    History of Present Illness Pt is a 68 y.o. female s/p right TKA, with history of arthritis of left knee. Respiratory distress 4/16 requiring Bipap    PT Comments    Pt pleasant and eager to have bipap removed and be allowed to drink. Pt A&Ox 4 and able to state knee precaution, need for KI and how to don KI. Pt with limited mobility due to bipap and encouraged to continue transfers with staff and HEP. Will continue to follow. Pt tolerated well on bipap with rate 98% and HR 80.   Follow Up Recommendations  SNF     Equipment Recommendations       Recommendations for Other Services       Precautions / Restrictions Precautions Precautions: Knee Required Braces or Orthoses: Knee Immobilizer - Right Knee Immobilizer - Right: On when out of bed or walking Restrictions RLE Weight Bearing: Weight bearing as tolerated LLE Weight Bearing: Weight bearing as tolerated    Mobility  Bed Mobility Overal bed mobility: Needs Assistance       Supine to sit: Min assist     General bed mobility comments: cues for sequence with assist to bring RLE to EOB  Transfers     Transfers: Sit to/from Stand Sit to Stand: Min assist         General transfer comment: cues for hand placement and safety   Ambulation/Gait Ambulation/Gait assistance: Min assist Ambulation Distance (Feet): 3 Feet Assistive device: Rolling walker (2 wheeled) Gait Pattern/deviations: Step-to pattern;Trunk flexed;Decreased stance time - right   Gait velocity interpretation: <1.8 ft/sec, indicative of risk for recurrent falls General Gait Details: cues for sequence and posture with distance limited by Automatic Data            Wheelchair Mobility    Modified Rankin (Stroke Patients Only)       Balance                                    Cognition  Arousal/Alertness: Awake/alert Behavior During Therapy: WFL for tasks assessed/performed Overall Cognitive Status: Within Functional Limits for tasks assessed                      Exercises Total Joint Exercises Ankle Circles/Pumps: AROM;Right;15 reps;Supine Short Arc Quad: AROM;Right;15 reps;Supine Heel Slides: AAROM;Right;15 reps;Supine    General Comments        Pertinent Vitals/Pain Pain 10/10 right knee with movement, 3/10 at rest, RN aware of request for meds, repositioned    Home Living                      Prior Function            PT Goals (current goals can now be found in the care plan section) Progress towards PT goals: Not progressing toward goals - comment (limited by bipap today)    Frequency       PT Plan Current plan remains appropriate    Co-evaluation             End of Session Equipment Utilized During Treatment: Right knee immobilizer;Oxygen Activity Tolerance: Patient tolerated treatment well Patient left: in chair;with call bell/phone within [name]     Time: 0742-0806 PT Time Calculation (min): 24 min  Charges:  $Therapeutic  Exercise: 8-22 mins $Therapeutic Activity: 8-22 mins                    G Codes:      [name] [name], [name]

## 2013-12-19 NOTE — Progress Notes (Signed)
*  PRELIMINARY RESULTS* Vascular Ultrasound Lower extremity venous duplex has been completed.  Preliminary findings: no obvious evidence of DVT.   Chapman Fitch RVT 12/19/2013, 10:38 AM

## 2013-12-19 NOTE — Progress Notes (Addendum)
PROGRESS NOTE  [name] [id number] DOB: 1946-07-28 DOA: 12/15/2013 PCP: [name], MD  This is a 68 yo Female who underwent knee replacement surgery, her recovery was complicated by a CHF exacerbation.  Her weight increased from 241 to 254.  She had an episode of desaturation and was palced on Bipap in the SDU.    Assessment/Plan: Acute resp failure due to acute diastolic heart failure- not on O2 at home;- chest x ray suggestive of pulmonary edema: echo shows diastolic dsfxn, change to IV lasix- have diuresed about 3.5L - continue IV lasix, strict i/os and daily weights 4/16 patient had episode of desaturation and had to be placed on Bipap and transferred to the SDU -wean off Bipap 4/16  SOB: see above  Hypokalemia- replete as on lasix  DM- on lantus and SSI  CKD- continue lasix and follow up outpatient-  Baseline CR in the 2s  S/p knee replacement surgery- per primary  Fever- abx and incentive spirometry, duplex to r/o DVT- lovenox until duplex back   Code Status: full Family Communication: patient Disposition Plan: consult    Procedures:  S/p knee replacement  Antibiotics:  levaquin  HPI/Subjective: Breathing better Pain in right leg  Objective: Filed Vitals:   12/19/13 0810  BP:   Pulse: 87  Temp:   Resp: 20    Intake/Output Summary (Last 24 hours) at 12/19/13 0819 Last data filed at 12/19/13 0655  Gross per 24 hour  Intake     75 ml  Output   3250 ml  Net  -3175 ml   Filed Weights   12/15/13 1046 12/17/13 1900 12/19/13 0325  Weight: 109.7 kg (241 lb 13.5 oz) 115.259 kg (254 lb 1.6 oz) 111.8 kg (246 lb 7.6 oz)    Exam:   General:  A+Ox3, NAD  Cardiovascular: rrr  Respiratory: diminished  Abdomen: +BS, soft  Musculoskeletal: right leg in brace   Data Reviewed: Basic Metabolic Panel:  Recent Labs Lab 12/15/13 1036 12/16/13 0420 12/17/13 0431 12/18/13 0914 12/19/13 0337  NA 145 144 144 141 145  K 3.4* 3.6* 3.2*  3.7 3.3*  CL 105 105 106 102 106  CO2 22 23 22 22 24   GLUCOSE 143* 183* 145* 250* 136*  BUN 20 19 24* 31* 33*  CREATININE 2.07* 1.96* 2.49* 2.84* 2.82*  CALCIUM 9.4 8.6 8.4 8.7 8.9   Liver Function Tests:  Recent Labs Lab 12/15/13 1036  AST 20  ALT 12  ALKPHOS 60  BILITOT 0.4  PROT 7.4  ALBUMIN 3.7   No results found for this basename: LIPASE, AMYLASE,  in the last 168 hours No results found for this basename: AMMONIA,  in the last 168 hours CBC:  Recent Labs Lab 12/16/13 0420 12/17/13 0431 12/18/13 0456 12/19/13 0337  WBC 12.0* 12.2* 13.6* 10.3  HGB 12.8 11.5* 11.2* 11.1*  HCT 38.9 35.4* 34.3* 34.4*  MCV 95.1 94.9 94.5 95.6  PLT 258 204 187 200   Cardiac Enzymes: No results found for this basename: CKTOTAL, CKMB, CKMBINDEX, TROPONINI,  in the last 168 hours BNP (last 3 results) No results found for this basename: PROBNP,  in the last 8760 hours CBG:  Recent Labs Lab 12/17/13 2133 12/18/13 0628 12/18/13 1127 12/18/13 1611 12/18/13 2143  GLUCAP 141* 146* 170* 135* 145*    Recent Results (from the past 240 hour(s))  SURGICAL PCR SCREEN     Status: None   Collection Time    12/09/13  1:11 PM  Result Value Ref Range Status   MRSA, PCR NEGATIVE  NEGATIVE Final   Staphylococcus aureus NEGATIVE  NEGATIVE Final   Comment:            The Xpert SA Assay (FDA     approved for NASAL specimens     in patients over 50 years of age),     is one component of     a comprehensive surveillance     program.  Test performance has     been validated by [employer] for patients greater     than or equal to 56 year old.     It is not intended     to diagnose infection nor to     guide or monitor treatment.     Studies: Dg Chest 2 View  12/17/2013   CLINICAL DATA:  Shortness of breath, hypoxia  EXAM: CHEST  2 VIEW  COMPARISON:  DG CHEST 1V PORT dated 12/17/2013  FINDINGS: There are bilateral interstitial and alveolar airspace opacities. There is no pleural  effusion or pneumothorax. Stable cardiomediastinal silhouette. Unremarkable osseous structures.  IMPRESSION: Overall findings concerning for pulmonary edema.   Electronically Signed   By: Kathreen Devoid   On: 12/17/2013 21:15   Dg Chest Port 1 View  12/17/2013   CLINICAL DATA:  decreased o2 sat post op  EXAM: PORTABLE CHEST - 1 VIEW  COMPARISON:  DG CHEST 2 VIEW dated 12/09/2013  FINDINGS: The cardiac silhouette is mild-to-moderately enlarged. Aorta is tortuous and ectatic. There is prominence of the interstitial markings, and peribronchial cuffing. An area of increased density is appreciated within the left lung base. The osseous structures unremarkable.  IMPRESSION: Pulmonary edema  Focal infiltrate left lung base differential considerations are infectious pneumonitis versus asymmetric edema. Repeat surveillance evaluation with two-view chest radiograph is recommended.   Electronically Signed   By: Margaree Mackintosh M.D.   On: 12/17/2013 10:41    Scheduled Meds: . albuterol  2.5 mg Nebulization Q6H  . amitriptyline  25 mg Oral QHS  . aspirin EC  325 mg Oral BID  . atorvastatin  20 mg Oral q1800  . carvedilol  3.125 mg Oral BID WC  . clopidogrel  75 mg Oral Q breakfast  . docusate sodium  100 mg Oral BID  . enoxaparin (LOVENOX) injection  115 mg Subcutaneous Q24H  . febuxostat  40 mg Oral Daily  . furosemide  160 mg Intravenous 3 times per day  . insulin glargine  15 Units Subcutaneous Daily  . levofloxacin  750 mg Oral Q48H  . Linaclotide  290 mcg Oral Daily  . pantoprazole  40 mg Oral Daily  . potassium chloride SA  20 mEq Oral BID  . potassium chloride  40 mEq Oral Once   Continuous Infusions:   Antibiotics Given (last 72 hours)   Date/Time Action Medication Dose   12/17/13 1553 Given   levofloxacin (LEVAQUIN) tablet 750 mg 750 mg      Principal Problem:   Osteoarthritis of left knee Active Problems:   Obstructive sleep apnea   Osteoarthritis of right knee   Acute respiratory  failure   SOB (shortness of breath)   Chronic kidney disease (CKD), stage IV (severe)   Diabetes    Time spent: 35 min    [place] [facility] Pager [phone]. If 7PM-7AM, please contact night-coverage at [email or web address], password [facility] 12/19/2013, 8:19 AM  LOS: 4 days

## 2013-12-19 NOTE — Progress Notes (Signed)
Patient not ready to go on BIPAP at this time.  Family at bedside and patient is on 5L [place] sats are 92%.  RT will continue to monitor.

## 2013-12-19 NOTE — Progress Notes (Signed)
Subjective: 4 Days Post-Op Procedure(s) (LRB): RIGHT TOTAL KNEE ARTHROPLASTY (Right) Pt seen at 3 pm Patient reports pain as 3 on 0-10 scale.   Up to bedside chair  Objective: Vital signs in last 24 hours: Temp:  [97.4 F (36.3 C)-102 F (38.9 C)] 99.2 F (37.3 C) (04/17 1700) Pulse Rate:  [71-87] 81 (04/17 1646) Resp:  [19-25] 20 (04/17 1646) BP: (103-145)/(51-83) 136/51 mmHg (04/17 1646) SpO2:  [92 %-100 %] 93 % (04/17 1646) FiO2 (%):  [50 %-70 %] 55 % (04/17 1509) Weight:  [111.8 kg (246 lb 7.6 oz)] 111.8 kg (246 lb 7.6 oz) (04/17 0325)  Venous doppler neg for DVT(prelim report) Intake/Output from previous day: 04/16 0701 - 04/17 0700 In: 75 [P.O.:75] Out: 3250 [Urine:3250] Intake/Output this shift: Total I/O In: 720 [P.O.:720] Out: 1250 [Urine:1250]   Recent Labs  12/17/13 0431 12/18/13 0456 12/19/13 0337  HGB 11.5* 11.2* 11.1*    Recent Labs  12/18/13 0456 12/19/13 0337  WBC 13.6* 10.3  RBC 3.63* 3.60*  HCT 34.3* 34.4*  PLT 187 200    Recent Labs  12/18/13 0914 12/19/13 0337  NA 141 145  K 3.7 3.3*  CL 102 106  CO2 22 24  BUN 31* 33*  CREATININE 2.84* 2.82*  GLUCOSE 250* 136*  CALCIUM 8.7 8.9  Right knee exam: Knee immobilizer intact to right leg Neurovascular intact Sensation intact distally Intact pulses distally Dorsiflexion/Plantar flexion intact Incision: dressing C/D/I  Assessment/Plan: 4 Days Post-Op Procedure(s) (LRB): RIGHT TOTAL KNEE ARTHROPLASTY (Right) ARF   Managed by med service Plan: Medical treatment per hospitalists Cont sq Lovenox for DVT prophylaxis Mobilize pt with PT Cont CPM with aggressive ROM WBAT on right  May remove knee immobilizer when in bed    Use for ambulation Will probably need SNF poss Monday when pulmonary issues improved Dr Milly Jakob covering over weekend  Erlene Senters 12/19/2013, 5:57 PM

## 2013-12-20 ENCOUNTER — Inpatient Hospital Stay (HOSPITAL_COMMUNITY): Payer: Medicare HMO

## 2013-12-20 LAB — CBC
HCT: 32.7 % — ABNORMAL LOW (ref 36.0–46.0)
Hemoglobin: 10.6 g/dL — ABNORMAL LOW (ref 12.0–15.0)
MCH: 31.1 pg (ref 26.0–34.0)
MCHC: 32.4 g/dL (ref 30.0–36.0)
MCV: 95.9 fL (ref 78.0–100.0)
Platelets: 234 10*3/uL (ref 150–400)
RBC: 3.41 MIL/uL — ABNORMAL LOW (ref 3.87–5.11)
RDW: 15.8 % — ABNORMAL HIGH (ref 11.5–15.5)
WBC: 10.5 10*3/uL (ref 4.0–10.5)

## 2013-12-20 LAB — BASIC METABOLIC PANEL
BUN: 34 mg/dL — ABNORMAL HIGH (ref 6–23)
CO2: 26 mEq/L (ref 19–32)
Calcium: 9.1 mg/dL (ref 8.4–10.5)
Chloride: 104 mEq/L (ref 96–112)
Creatinine, Ser: 2.72 mg/dL — ABNORMAL HIGH (ref 0.50–1.10)
GFR calc Af Amer: 20 mL/min — ABNORMAL LOW (ref >90)
GFR calc non Af Amer: 17 mL/min — ABNORMAL LOW (ref >90)
Glucose, Bld: 148 mg/dL — ABNORMAL HIGH (ref 70–99)
Potassium: 3.8 mEq/L (ref 3.7–5.3)
Sodium: 146 mEq/L (ref 137–147)

## 2013-12-20 LAB — GLUCOSE, CAPILLARY
Glucose-Capillary: 161 mg/dL — ABNORMAL HIGH (ref 70–99)
Glucose-Capillary: 213 mg/dL — ABNORMAL HIGH (ref 70–99)
Glucose-Capillary: 176 mg/dL — ABNORMAL HIGH (ref 70–99)
Glucose-Capillary: 201 mg/dL — ABNORMAL HIGH (ref 70–99)
Glucose-Capillary: 218 mg/dL — ABNORMAL HIGH (ref 70–99)

## 2013-12-20 MED ORDER — METOLAZONE 5 MG PO TABS
5.0000 mg | ORAL_TABLET | Freq: Every day | ORAL | Status: DC
  Administered 2013-12-20 – 2013-12-21 (×2): 5 mg via ORAL
  Filled 2013-12-20 (×3): qty 1

## 2013-12-20 MED ORDER — TORSEMIDE 20 MG/2ML IV SOLN: 20.0000 mg | Freq: Two times a day (BID) | INTRAVENOUS | Status: DC

## 2013-12-20 MED ORDER — TORSEMIDE 20 MG PO TABS
20.0000 mg | ORAL_TABLET | Freq: Two times a day (BID) | ORAL | Status: DC
  Administered 2013-12-20 – 2013-12-22 (×5): 20 mg via ORAL
  Filled 2013-12-20 (×7): qty 1

## 2013-12-20 MED ORDER — INSULIN ASPART 100 UNIT/ML ~~LOC~~ SOLN
0.0000 [IU] | Freq: Three times a day (TID) | SUBCUTANEOUS | Status: DC
  Administered 2013-12-20: 5 [IU] via SUBCUTANEOUS
  Administered 2013-12-20: 3 [IU] via SUBCUTANEOUS
  Administered 2013-12-20: 5 [IU] via SUBCUTANEOUS
  Administered 2013-12-21: 11 [IU] via SUBCUTANEOUS
  Administered 2013-12-21: 5 [IU] via SUBCUTANEOUS
  Administered 2013-12-21 – 2013-12-22 (×2): 3 [IU] via SUBCUTANEOUS
  Administered 2013-12-22: 5 [IU] via SUBCUTANEOUS
  Administered 2013-12-22: 8 [IU] via SUBCUTANEOUS
  Administered 2013-12-23 (×2): 5 [IU] via SUBCUTANEOUS

## 2013-12-20 NOTE — Progress Notes (Signed)
ANTIBIOTIC CONSULT NOTE - FOLLOW UP  Pharmacy Consult for Levaquin Indication: CAP  Allergies  Allergen Reactions  . Codeine Nausea And Vomiting  . Penicillins Nausea And Vomiting  . Sulfa Antibiotics Itching and Nausea And Vomiting    "everything I seen was red"  . Other Itching    Adhesive from ekg leads    Patient Measurements: Height: 5\' 4"  (162.6 cm) Weight: 245 lb 6 oz (111.3 kg) IBW/kg (Calculated) : 54.7  Vital Signs: Temp: 98.4 F (36.9 C) (04/18 0748) Temp src: Oral (04/18 0748) BP: 149/67 mmHg (04/18 0748) Pulse Rate: 77 (04/18 0748) Intake/Output from previous day: 04/17 0701 - 04/18 0700 In: 1200 [P.O.:1200] Out: 2150 [Urine:2150] Intake/Output from this shift: Total I/O In: 1270 [P.O.:940; IV Piggyback:330] Out: 1100 [Urine:1100]  Labs:  Recent Labs  12/18/13 0456 12/18/13 0914 12/19/13 0337 12/20/13 0315  WBC 13.6*  --  10.3 10.5  HGB 11.2*  --  11.1* 10.6*  PLT 187  --  200 234  CREATININE  --  2.84* 2.82* 2.72*   Estimated Creatinine Clearance: 24.5 ml/min (by C-G formula based on Cr of 2.72).  Assessment:  Day # 4 Levaquin 750 mg PO q48hrs.  2nd dose given 4/17.  Tmax 99.2, WBC trended down to 10.5; on 5L O2.  Renal insufficiency; diuresing.  Levaquin dose remains appropriate.  Goal of Therapy:  appropriate Levaquin dose for renal funciton & infection  Plan:   Continue Levaquin 750 mg PO q48hrs. Next dose due 4/19.  Follow renal function, clinical progress, length of therapy.  Do not expect Levaquin dose to need adjustment.  Arty Baumgartner, [place] Pager: [phone] 12/20/2013,10:49 AM

## 2013-12-20 NOTE — Progress Notes (Signed)
Orthopedic Tech Progress Note Patient Details:  [name] Sep 08, 1945 [id number]  Patient ID: [name], female   DOB: 18-Mar-1946, 68 y.o.   MRN: [id number]   [name] 12/20/2013, 11:38 AMPatient in CPM at 11:30am.

## 2013-12-20 NOTE — Progress Notes (Signed)
Subjective: 5 Days Post-Op Procedure(s) (LRB): RIGHT TOTAL KNEE ARTHROPLASTY (Right) Pt seen at 9:30am Patient reports pain as well controlled Sitting up in bed, getting breathing treatment  Objective: Vital signs in last 24 hours: Temp:  [97.9 F (36.6 C)-99.2 F (37.3 C)] 98.4 F (36.9 C) (04/18 0748) Pulse Rate:  [65-81] 77 (04/18 0748) Resp:  [13-24] 18 (04/18 0748) BP: (112-149)/(51-69) 149/67 mmHg (04/18 0748) SpO2:  [92 %-100 %] 95 % (04/18 0930) FiO2 (%):  [55 %-60 %] 60 % (04/18 0344) Weight:  [111.3 kg (245 lb 6 oz)] 111.3 kg (245 lb 6 oz) (04/18 0341)  Venous doppler neg for DVT(prelim report) Intake/Output from previous day: 04/17 0701 - 04/18 0700 In: 1200 [P.O.:1200] Out: 2150 [Urine:2150] Intake/Output this shift: Total I/O In: 1030 [P.O.:700; IV Piggyback:330] Out: 1100 [Urine:1100]   Recent Labs  12/18/13 0456 12/19/13 0337 12/20/13 0315  HGB 11.2* 11.1* 10.6*    Recent Labs  12/19/13 0337 12/20/13 0315  WBC 10.3 10.5  RBC 3.60* 3.41*  HCT 34.4* 32.7*  PLT 200 234    Recent Labs  12/19/13 0337 12/20/13 0315  NA 145 146  K 3.3* 3.8  CL 106 104  CO2 24 26  BUN 33* 34*  CREATININE 2.82* 2.72*  GLUCOSE 136* 148*  CALCIUM 8.9 9.1  Right knee exam: Bed with knee elevated, no KI in place at present Neurovascular intact Sensation intact distally Intact pulses distally Dorsiflexion/Plantar flexion intact Incision: dressing C/D/I Incision benign  Assessment/Plan: 5 Days Post-Op Procedure(s) (LRB): RIGHT TOTAL KNEE ARTHROPLASTY (Right) ARF   Managed by med service Plan: Medical treatment per hospitalists Cont sq Lovenox for DVT prophylaxis Mobilize pt with PT Cont CPM with aggressive ROM WBAT on right  May remove knee immobilizer when in bed--but should try to keep bed without knee of bed elevated to keep her knee in full extension Will probably need SNF poss Monday when pulmonary issues improved Dr Milly Jakob covering over  weekend  Jolyn Nap 12/20/2013, 9:34 AM

## 2013-12-20 NOTE — Progress Notes (Signed)
PROGRESS NOTE  [name] [id number] DOB: 03/01/1946 DOA: 12/15/2013 PCP: [name], MD  This is a 68 yo Female who underwent knee replacement surgery, her recovery was complicated by a CHF exacerbation.  Her weight increased from 241 to 254.  She had an episode of desaturation and was palced on Bipap in the SDU.      Assessment/Plan: Acute resp failure due to acute diastolic heart failure-  -not on O2 at home - chest x ray suggestive of pulmonary edema- x ray on 4/18 still with fluid - echo shows diastolic dsfxn -given IV lasix- have diuresed about 4L but with continued fluid, spoke with Dr. Einar Gip and will give zaroxyln 5 mg and demadex 20 mg BID- if patient not improved this PM, nurse to notify and he will see patient -  strict i/os and daily weights -4/16 patient had episode of desaturation and had to be placed on Bipap and transferred to the SDU -now on 5L O2  SOB: see above  Hypokalemia- replete  DM- on lantus and SSI  CKD- continue lasix and follow up outpatient-  Baseline CR in the 2s  S/p knee replacement surgery- per primary  Fever- abx and incentive spirometry, duplex negative for DVT- d/c lovenox   Code Status: full Family Communication: patient Disposition Plan: consult    Procedures:  S/p knee replacement  Antibiotics:  levaquin  HPI/Subjective: Off Bipap to [place], still with some SOB Pain controlled No BM yet  Objective: Filed Vitals:   12/20/13 0748  BP: 149/67  Pulse: 77  Temp:   Resp: 18    Intake/Output Summary (Last 24 hours) at 12/20/13 0809 Last data filed at 12/20/13 0808  Gross per 24 hour  Intake   1240 ml  Output   2300 ml  Net  -1060 ml   Filed Weights   12/17/13 1900 12/19/13 0325 12/20/13 0341  Weight: 115.259 kg (254 lb 1.6 oz) 111.8 kg (246 lb 7.6 oz) 111.3 kg (245 lb 6 oz)    Exam:   General:  A+Ox3, NAD  Cardiovascular: rrr  Respiratory: diminished  Abdomen: +BS, soft  Musculoskeletal: right  leg in brace   Data Reviewed: Basic Metabolic Panel:  Recent Labs Lab 12/16/13 0420 12/17/13 0431 12/18/13 0914 12/19/13 0337 12/20/13 0315  NA 144 144 141 145 146  K 3.6* 3.2* 3.7 3.3* 3.8  CL 105 106 102 106 104  CO2 23 22 22 24 26   GLUCOSE 183* 145* 250* 136* 148*  BUN 19 24* 31* 33* 34*  CREATININE 1.96* 2.49* 2.84* 2.82* 2.72*  CALCIUM 8.6 8.4 8.7 8.9 9.1   Liver Function Tests:  Recent Labs Lab 12/15/13 1036  AST 20  ALT 12  ALKPHOS 60  BILITOT 0.4  PROT 7.4  ALBUMIN 3.7   No results found for this basename: LIPASE, AMYLASE,  in the last 168 hours No results found for this basename: AMMONIA,  in the last 168 hours CBC:  Recent Labs Lab 12/16/13 0420 12/17/13 0431 12/18/13 0456 12/19/13 0337 12/20/13 0315  WBC 12.0* 12.2* 13.6* 10.3 10.5  HGB 12.8 11.5* 11.2* 11.1* 10.6*  HCT 38.9 35.4* 34.3* 34.4* 32.7*  MCV 95.1 94.9 94.5 95.6 95.9  PLT 258 204 187 200 234   Cardiac Enzymes: No results found for this basename: CKTOTAL, CKMB, CKMBINDEX, TROPONINI,  in the last 168 hours BNP (last 3 results) No results found for this basename: PROBNP,  in the last 8760 hours CBG:  Recent Labs Lab 12/19/13 0823 12/19/13 1244  12/19/13 1444 12/19/13 1635 12/19/13 2132  GLUCAP 135* 237* 286* 280* 116*    No results found for this or any previous visit (from the past 240 hour(s)).   Studies: No results found.  Scheduled Meds: . albuterol  2.5 mg Nebulization Q6H  . amitriptyline  25 mg Oral QHS  . aspirin EC  325 mg Oral BID  . atorvastatin  20 mg Oral q1800  . carvedilol  3.125 mg Oral BID WC  . clopidogrel  75 mg Oral Q breakfast  . docusate sodium  100 mg Oral BID  . febuxostat  40 mg Oral Daily  . heparin subcutaneous  5,000 Units Subcutaneous 3 times per day  . insulin aspart  0-15 Units Subcutaneous TID WC  . insulin aspart  0-5 Units Subcutaneous QHS  . insulin glargine  15 Units Subcutaneous Daily  . levofloxacin  750 mg Oral Q48H  .  Linaclotide  290 mcg Oral Daily  . metolazone  5 mg Oral Daily  . pantoprazole  40 mg Oral Daily  . potassium chloride SA  20 mEq Oral BID  . torsemide  20 mg Intravenous BID   Continuous Infusions:   Antibiotics Given (last 72 hours)   Date/Time Action Medication Dose   12/17/13 1553 Given   levofloxacin (LEVAQUIN) tablet 750 mg 750 mg   12/19/13 1448 Given   levofloxacin (LEVAQUIN) tablet 750 mg 750 mg      Principal Problem:   Osteoarthritis of left knee Active Problems:   Obstructive sleep apnea   Osteoarthritis of right knee   Acute respiratory failure   SOB (shortness of breath)   Chronic kidney disease (CKD), stage IV (severe)   Diabetes    Time spent: 35 min    [facility] Pager [phone]. If 7PM-7AM, please contact night-coverage at [email or web address], password [facility] 12/20/2013, 8:09 AM  LOS: 5 days

## 2013-12-20 NOTE — Progress Notes (Signed)
Physical Therapy Treatment Patient Details Name: [name] MRN: [id number] DOB: [birth year] Today's Date: Jan 07, 2014    History of Present Illness      PT Comments    Pt reports not feeling well today, declining OOB/mobility training.  RLE exercises performed in bed.  RN notified.  Follow Up Recommendations  SNF     Equipment Recommendations  None recommended by PT    Recommendations for Other Services       Precautions / Restrictions Precautions Precautions: Knee Required Braces or Orthoses: Knee Immobilizer - Right Knee Immobilizer - Right: On when out of bed or walking Restrictions RLE Weight Bearing: Weight bearing as tolerated LLE Weight Bearing: Weight bearing as tolerated    Mobility  Bed Mobility                  Transfers                    Ambulation/Gait                 Stairs            Wheelchair Mobility    Modified Rankin (Stroke Patients Only)       Balance                                    Cognition Arousal/Alertness: Awake/alert Behavior During Therapy: WFL for tasks assessed/performed Overall Cognitive Status: Within Functional Limits for tasks assessed                      Exercises Total Joint Exercises Ankle Circles/Pumps: AROM;Both;10 reps;Supine Quad Sets: AROM;Right;10 reps;Supine Heel Slides: AROM;Right;10 reps;Supine Hip ABduction/ADduction: AROM;Right;10 reps;Supine Straight Leg Raises: AAROM;Right;5 reps;Supine Goniometric ROM: 0-60 AAROM R knee in supine    General Comments        Pertinent Vitals/Pain 0/10    Home Living                      Prior Function            PT Goals (current goals can now be found in the care plan section) Progress towards PT goals: Not progressing toward goals - comment (progress hindered by medical complications (respiratory))    Frequency  7X/week    PT Plan Current plan remains appropriate     Co-evaluation             End of Session Equipment Utilized During Treatment: Oxygen Activity Tolerance: Patient limited by fatigue Patient left: in bed;with call bell/phone within reach;with nursing/sitter in room     Time: [id number] PT Time Calculation (min): 11 min  Charges:  $Therapeutic Exercise: 8-22 mins                    G Codes:      Philippa Sicks 01-07-2014, 2:40 PM  Lorrin Goodell, PT  Office # [phone] Pager [phone]

## 2013-12-20 NOTE — Progress Notes (Signed)
Orthopedic Tech Progress Note Patient Details:  [name] Apr 07, 1946 [id number] On cpm at 9:35 pm RLE 0-60 Patient ID: [name], female   DOB: 29-Jan-1946, 68 y.o.   MRN: [id number]   [name] 12/20/2013, 9:38 PM

## 2013-12-21 DIAGNOSIS — K5900 Constipation, unspecified: Secondary | ICD-10-CM

## 2013-12-21 DIAGNOSIS — I5033 Acute on chronic diastolic (congestive) heart failure: Secondary | ICD-10-CM | POA: Diagnosis present

## 2013-12-21 LAB — CBC
HCT: 34.9 % — ABNORMAL LOW (ref 36.0–46.0)
Hemoglobin: 11.3 g/dL — ABNORMAL LOW (ref 12.0–15.0)
MCH: 30.9 pg (ref 26.0–34.0)
MCHC: 32.4 g/dL (ref 30.0–36.0)
MCV: 95.4 fL (ref 78.0–100.0)
Platelets: 286 10*3/uL (ref 150–400)
RBC: 3.66 MIL/uL — ABNORMAL LOW (ref 3.87–5.11)
RDW: 15.3 % (ref 11.5–15.5)
WBC: 10.2 10*3/uL (ref 4.0–10.5)

## 2013-12-21 LAB — BASIC METABOLIC PANEL
BUN: 37 mg/dL — ABNORMAL HIGH (ref 6–23)
CO2: 27 mEq/L (ref 19–32)
Calcium: 9.8 mg/dL (ref 8.4–10.5)
Chloride: 99 mEq/L (ref 96–112)
Creatinine, Ser: 2.61 mg/dL — ABNORMAL HIGH (ref 0.50–1.10)
GFR calc Af Amer: 21 mL/min — ABNORMAL LOW (ref >90)
GFR calc non Af Amer: 18 mL/min — ABNORMAL LOW (ref >90)
Glucose, Bld: 195 mg/dL — ABNORMAL HIGH (ref 70–99)
Potassium: 3.7 mEq/L (ref 3.7–5.3)
Sodium: 142 mEq/L (ref 137–147)

## 2013-12-21 LAB — GLUCOSE, CAPILLARY
Glucose-Capillary: 192 mg/dL — ABNORMAL HIGH (ref 70–99)
Glucose-Capillary: 312 mg/dL — ABNORMAL HIGH (ref 70–99)
Glucose-Capillary: 232 mg/dL — ABNORMAL HIGH (ref 70–99)
Glucose-Capillary: 221 mg/dL — ABNORMAL HIGH (ref 70–99)

## 2013-12-21 MED ORDER — POLYETHYLENE GLYCOL 3350 17 G PO PACK
17.0000 g | PACK | Freq: Two times a day (BID) | ORAL | Status: DC
  Administered 2013-12-21 – 2013-12-22 (×2): 17 g via ORAL
  Filled 2013-12-21 (×6): qty 1

## 2013-12-21 MED ORDER — SENNOSIDES-DOCUSATE SODIUM 8.6-50 MG PO TABS
1.0000 | ORAL_TABLET | Freq: Two times a day (BID) | ORAL | Status: DC
  Administered 2013-12-21 – 2013-12-23 (×5): 1 via ORAL
  Filled 2013-12-21 (×5): qty 1

## 2013-12-21 MED ORDER — BISACODYL 10 MG RE SUPP: 10.0000 mg | Freq: Every day | RECTAL | Status: DC | PRN

## 2013-12-21 MED ORDER — FLEET ENEMA 7-19 GM/118ML RE ENEM: 1.0000 | ENEMA | Freq: Every day | RECTAL | Status: DC | PRN

## 2013-12-21 MED ORDER — INSULIN GLARGINE 100 UNIT/ML ~~LOC~~ SOLN
20.0000 [IU] | Freq: Every day | SUBCUTANEOUS | Status: DC
  Administered 2013-12-21: 20 [IU] via SUBCUTANEOUS
  Filled 2013-12-21 (×2): qty 0.2

## 2013-12-21 MED ORDER — POLYETHYLENE GLYCOL 3350 17 G PO PACK
17.0000 g | PACK | Freq: Two times a day (BID) | ORAL | Status: DC
  Filled 2013-12-21: qty 1

## 2013-12-21 NOTE — Progress Notes (Signed)
Clinical Social Work Department CLINICAL SOCIAL WORK PLACEMENT NOTE 12/21/2013  Patient:  DALYLAH, KAMMEYER  Account Number:  [id number] Admit date:  12/15/2013  Clinical Social Worker:  Blima Rich, Latanya Presser  Date/time:  12/21/2013 11:32 AM  Clinical Social Work is seeking post-discharge placement for this patient at the following level of care:   [place]   (*CSW will update this form in Epic as items are completed)   12/21/2013  Patient/family provided with [place] Department of Clinical Social Work's list of facilities offering this level of care within the geographic area requested by the patient (or if unable, by the patient's family).  12/21/2013  Patient/family informed of their freedom to choose among providers that offer the needed level of care, that participate in Medicare, Medicaid or managed care program needed by the patient, have an available bed and are willing to accept the patient.  12/21/2013  Patient/family informed of [facility]' ownership interest in [facility], as well as of the fact that they are under no obligation to receive care at this facility.  PASARR submitted to EDS on 12/18/2013 PASARR number received from EDS on 12/18/2013  FL2 transmitted to all facilities in geographic area requested by pt/family on  12/18/2013 FL2 transmitted to all facilities within larger geographic area on   Patient informed that his/her managed care company has contracts with or will negotiate with  certain facilities, including the following:     Patient/family informed of bed offers received:   Patient chooses bed at  Physician recommends and patient chooses bed at    Patient to be transferred to  on   Patient to be transferred to facility by   The following physician request were entered in Epic:   Additional Comments:

## 2013-12-21 NOTE — Progress Notes (Addendum)
Chart reviewed.   PROGRESS NOTE  BRYNN GAIR [id number] DOB: 1946-07-21 DOA: 12/15/2013 PCP: [name], MD  This is a 68 yo Female who underwent knee replacement surgery, her recovery was complicated by a CHF exacerbation.  Her weight increased from 241 to 254.  She had an episode of desaturation and was palced on Bipap in the SDU.      Assessment/Plan: Acute resp failure due to acute diastolic heart failure-  -not on O2 at home - chest x ray suggestive of pulmonary edema- x ray on 4/18 still with fluid - echo shows diastolic dsfxn -Dr. Eliseo Squires spoke with Dr. Einar Gip -  strict i/os and daily weights -4/16 patient had episode of desaturation and had to be placed on Bipap and transferred to the SDU Still requiring oxygen. Wean as able Weight back to admission.  OSA  Hypokalemia- resolved  DM- not optimally controlled. Increase lantus  CKD- stable. Baseline CR in the 2s  S/p knee replacement surgery- continue PT, DVT prophylaxis  Fever resolved. no infection. D/c levofloxacin  GERD   Code Status: full Family Communication: patient Disposition Plan: SNF    Procedures:  S/p knee replacement  Antibiotics:  levaquin 4/15-4/19  HPI/Subjective: Breathing improved, but not back to baseline. Still constipated.  Objective: Filed Vitals:   12/21/13 0807  BP: 124/66  Pulse: 75  Temp:   Resp: 16    Intake/Output Summary (Last 24 hours) at 12/21/13 0954 Last data filed at 12/21/13 0823  Gross per 24 hour  Intake   1020 ml  Output   4400 ml  Net  -3380 ml   Filed Weights   12/19/13 0325 12/20/13 0341 12/21/13 0325  Weight: 111.8 kg (246 lb 7.6 oz) 111.3 kg (245 lb 6 oz) 109 kg (240 lb 4.8 oz)    Exam:   General:  A+Ox3, NAD. In chair  Cardiovascular: rrr  Respiratory: diminished  Abdomen: +BS, soft  Musculoskeletal: right leg in brace   Data Reviewed: Basic Metabolic Panel:  Recent Labs Lab 12/17/13 0431 12/18/13 0914 12/19/13 0337  12/20/13 0315 12/21/13 0355  NA 144 141 145 146 142  K 3.2* 3.7 3.3* 3.8 3.7  CL 106 102 106 104 99  CO2 22 22 24 26 27   GLUCOSE 145* 250* 136* 148* 195*  BUN 24* 31* 33* 34* 37*  CREATININE 2.49* 2.84* 2.82* 2.72* 2.61*  CALCIUM 8.4 8.7 8.9 9.1 9.8   Liver Function Tests:  Recent Labs Lab 12/15/13 1036  AST 20  ALT 12  ALKPHOS 60  BILITOT 0.4  PROT 7.4  ALBUMIN 3.7   No results found for this basename: LIPASE, AMYLASE,  in the last 168 hours No results found for this basename: AMMONIA,  in the last 168 hours CBC:  Recent Labs Lab 12/17/13 0431 12/18/13 0456 12/19/13 0337 12/20/13 0315 12/21/13 0355  WBC 12.2* 13.6* 10.3 10.5 10.2  HGB 11.5* 11.2* 11.1* 10.6* 11.3*  HCT 35.4* 34.3* 34.4* 32.7* 34.9*  MCV 94.9 94.5 95.6 95.9 95.4  PLT 204 187 200 234 286   Cardiac Enzymes: No results found for this basename: CKTOTAL, CKMB, CKMBINDEX, TROPONINI,  in the last 168 hours BNP (last 3 results) No results found for this basename: PROBNP,  in the last 8760 hours CBG:  Recent Labs Lab 12/20/13 1128 12/20/13 1458 12/20/13 1729 12/20/13 2115 12/21/13 0802  GLUCAP 213* 176* 201* 218* 192*    Recent Results (from the past 240 hour(s))  CULTURE, BLOOD (ROUTINE X 2)  Status: None   Collection Time    12/18/13  8:44 PM      Result Value Ref Range Status   Specimen Description BLOOD RIGHT HAND   Final   Special Requests BOTTLES DRAWN AEROBIC ONLY 10CC   Final   Culture  Setup Time     Final   Value: 12/19/2013 04:24     Performed at [facility]   Culture     Final   Value:        BLOOD CULTURE RECEIVED NO GROWTH TO DATE CULTURE WILL BE HELD FOR 5 DAYS BEFORE ISSUING A FINAL NEGATIVE REPORT     Performed at [facility]   Report Status PENDING   Incomplete  CULTURE, BLOOD (ROUTINE X 2)     Status: None   Collection Time    12/18/13  8:49 PM      Result Value Ref Range Status   Specimen Description BLOOD RIGHT HAND   Final   Special Requests  BOTTLES DRAWN AEROBIC ONLY 10CC   Final   Culture  Setup Time     Final   Value: 12/19/2013 04:24     Performed at [facility]   Culture     Final   Value:        BLOOD CULTURE RECEIVED NO GROWTH TO DATE CULTURE WILL BE HELD FOR 5 DAYS BEFORE ISSUING A FINAL NEGATIVE REPORT     Performed at [facility]   Report Status PENDING   Incomplete     Studies: Dg Chest Port 1 View  12/20/2013   CLINICAL DATA:  Hypoxia with fluid overload  EXAM: PORTABLE CHEST - 1 VIEW  COMPARISON:  DG CHEST 2 VIEW dated 12/17/2013; DG CHEST 1V PORT dated 12/17/2013; DG CHEST 2 VIEW dated 05/31/2008  FINDINGS: Examination is degraded due to patient body habitus and portable technique. Grossly unchanged enlarged cardiac silhouette and mediastinal contours given reduced lung volumes. The pulmonary vasculature appears indistinct with cephalization of flow. Worsening bilateral mid and lower lung heterogeneous/consolidative opacities, left greater than right. Trace bilateral effusions are not excluded. No definite pneumothorax. Unchanged bones.  IMPRESSION: Worsening extensive bilateral heterogeneous airspace opacities, left greater than right, possibly asymmetric pulmonary edema although underlying infection is not excluded.   Electronically Signed   By: Sandi Mariscal M.D.   On: 12/20/2013 08:59    Scheduled Meds: . albuterol  2.5 mg Nebulization Q6H  . amitriptyline  25 mg Oral QHS  . aspirin EC  325 mg Oral BID  . atorvastatin  20 mg Oral q1800  . carvedilol  3.125 mg Oral BID WC  . clopidogrel  75 mg Oral Q breakfast  . docusate sodium  100 mg Oral BID  . febuxostat  40 mg Oral Daily  . heparin subcutaneous  5,000 Units Subcutaneous 3 times per day  . insulin aspart  0-15 Units Subcutaneous TID WC  . insulin aspart  0-5 Units Subcutaneous QHS  . insulin glargine  15 Units Subcutaneous Daily  . levofloxacin  750 mg Oral Q48H  . Linaclotide  290 mcg Oral Daily  . metolazone  5 mg Oral Daily  .  pantoprazole  40 mg Oral Daily  . potassium chloride SA  20 mEq Oral BID  . torsemide  20 mg Oral BID   Continuous Infusions:   Antibiotics Given (last 72 hours)   Date/Time Action Medication Dose   12/19/13 1448 Given   levofloxacin (LEVAQUIN) tablet 750 mg 750 mg  Time spent: 35 min  Delfina Redwood, M.D. [facility] Pager [phone]. If 7PM-7AM, please contact night-coverage at [email or web address], password [facility] 12/21/2013, 9:54 AM  LOS: 6 days

## 2013-12-21 NOTE — Progress Notes (Signed)
Clinical Social Work Department BRIEF PSYCHOSOCIAL ASSESSMENT 12/21/2013  Patient:  [name], [name]     Account Number:  [id number]     Admit date:  12/15/2013  Clinical Social Worker:  Rolinda Roan  Date/Time:  12/21/2013 11:23 AM  Referred by:  Physician  Date Referred:  12/19/2013 Referred for  SNF Placement   Other Referral:   Interview type:  Patient Other interview type:    PSYCHOSOCIAL DATA Living Status:  WITH ADULT CHILDREN Admitted from facility:   Level of care:   Primary support name:  Gillian Scarce Primary support relationship to patient:  CHILD, ADULT Degree of support available:   Good support per patient.    CURRENT CONCERNS  Other Concerns:    SOCIAL WORK ASSESSMENT / PLAN Clinical Social Worker (CSW) met with patient to discuss SNF options. Patient reported that she lives with her daughter Gillian Scarce in [place]. Patient reported that her daughter cares for her. Patient chose [facility]. [facility] did make a bed offer. CSW explained to patient the Mcarthur Rossetti will have to authorize her stay at [facility] if they are going to be the primary payer source. Patient verbalized her understanding.    CSW let [place] know via carefinder that patient has accepted the bed offer and to start [facility] authorization.   Assessment/plan status:  Psychosocial Support/Ongoing Assessment of Needs Other assessment/ plan:   Information/referral to community resources:   CSW gave patient list of bed offers.    PATIENT'S/FAMILY'S RESPONSE TO PLAN OF CARE: Patient thanked CSW for assisting with placement process.

## 2013-12-21 NOTE — Progress Notes (Signed)
Orthopedic Tech Progress Note Patient Details:  [name] [birth year] [id number] On cpm at 11:00pm RLE 0-60 Patient ID: [name], female   DOB: 1946/09/04, 68 y.o.   MRN: [id number]   [name] 12/21/2013, 11:05 PM

## 2013-12-21 NOTE — Progress Notes (Signed)
Physical Therapy Treatment Patient Details Name: [name] MRN: [id number] DOB: 1945/11/07 Today's Date: 12/25/2013    History of Present Illness      PT Comments    Good progress noted today.  Follow Up Recommendations  SNF     Equipment Recommendations  None recommended by PT    Recommendations for Other Services       Precautions / Restrictions Precautions Precautions: Knee Required Braces or Orthoses: Knee Immobilizer - Right Knee Immobilizer - Right: On when out of bed or walking Restrictions Weight Bearing Restrictions: Yes (WBAT RLE) RLE Weight Bearing: Weight bearing as tolerated LLE Weight Bearing: Weight bearing as tolerated    Mobility  Bed Mobility         Supine to sit: Min assist        Transfers       Sit to Stand: Min assist         General transfer comment: verbal cues for hand placement, sequencing  Ambulation/Gait Ambulation/Gait assistance: Min assist Ambulation Distance (Feet): 30 Feet Assistive device: Rolling walker (2 wheeled) Gait Pattern/deviations: Step-to pattern;Antalgic Gait velocity: decreased Gait velocity interpretation: <1.8 ft/sec, indicative of risk for recurrent falls General Gait Details: verbal cues for sequencing   Stairs            Wheelchair Mobility    Modified Rankin (Stroke Patients Only)       Balance                                    Cognition Arousal/Alertness: Awake/alert Behavior During Therapy: WFL for tasks assessed/performed Overall Cognitive Status: Within Functional Limits for tasks assessed                      Exercises Total Joint Exercises Ankle Circles/Pumps: AROM;Both;10 reps Quad Sets: AROM;Right;10 reps Short Arc Quad: AROM;Right;10 reps Heel Slides: AROM;Right;10 reps Hip ABduction/ADduction: AROM;Right;10 reps    General Comments        Pertinent Vitals/Pain 10/10    Home Living                      Prior  Function            PT Goals (current goals can now be found in the care plan section) Progress towards PT goals: Progressing toward goals    Frequency  7X/week    PT Plan Current plan remains appropriate    Co-evaluation             End of Session Equipment Utilized During Treatment: Gait belt;Oxygen;Right knee immobilizer Activity Tolerance: Patient tolerated treatment well Patient left: in chair;with call bell/phone within reach     Time: 0926-0952 PT Time Calculation (min): 26 min  Charges:  $Gait Training: 8-22 mins $Therapeutic Exercise: 8-22 mins                    G Codes:      Philippa Sicks 12/25/13, 10:28 AM  Lorrin Goodell, PT  Office # [phone] Pager [phone]

## 2013-12-21 NOTE — Progress Notes (Addendum)
Subjective: 6 Days Post-Op Procedure(s) (LRB): RIGHT TOTAL KNEE ARTHROPLASTY (Right) Pt seen at 9:45am Patient reports pain as well controlled Sitting up in chair, working with PT, just ambulated into hall and kept sats up on 6L Reports no BM since surgery, but + flatus  Objective: Vital signs in last 24 hours: Temp:  [98 F (36.7 C)-99.3 F (37.4 C)] 98.3 F (36.8 C) (04/19 0803) Pulse Rate:  [73-83] 75 (04/19 0807) Resp:  [15-22] 16 (04/19 0807) BP: (119-160)/(66-73) 124/66 mmHg (04/19 0807) SpO2:  [91 %-99 %] 95 % (04/19 0807) FiO2 (%):  [40 %-50 %] 50 % (04/19 0325) Weight:  [109 kg (240 lb 4.8 oz)] 109 kg (240 lb 4.8 oz) (04/19 0325)  Venous doppler neg for DVT(prelim report) Intake/Output from previous day: 04/18 0701 - 04/19 0700 In: 2090 [P.O.:1760; IV Piggyback:330] Out: 5500 [Urine:5500] Intake/Output this shift: Total I/O In: 200 [P.O.:200] Out: -    Recent Labs  12/19/13 0337 12/20/13 0315 12/21/13 0355  HGB 11.1* 10.6* 11.3*    Recent Labs  12/20/13 0315 12/21/13 0355  WBC 10.5 10.2  RBC 3.41* 3.66*  HCT 32.7* 34.9*  PLT 234 286    Recent Labs  12/20/13 0315 12/21/13 0355  NA 146 142  K 3.8 3.7  CL 104 99  CO2 26 27  BUN 34* 37*  CREATININE 2.72* 2.61*  GLUCOSE 148* 195*  CALCIUM 9.1 9.8  Right knee exam:  Neurovascular intact Sensation intact distally Intact pulses distally Dorsiflexion/Plantar flexion intact Incision: dressing C/D/I Incision benign Comp soft  Assessment/Plan: 6 Days Post-Op Procedure(s) (LRB): RIGHT TOTAL KNEE ARTHROPLASTY (Right) ARF   Managed by med service Plan: Medical treatment per hospitalists ASA / scds  for DVT prophylaxis Will work on [employer] pt with PT Cont CPM with aggressive ROM WBAT on right  May remove knee immobilizer when in bed--but should try to keep bed without knee of bed elevated to keep her knee in full extension Will probably need SNF poss Monday when pulmonary issues  improved Dr Milly Jakob covering over weekend  Jolyn Nap 12/21/2013, 9:46 AM

## 2013-12-22 DIAGNOSIS — IMO0002 Reserved for concepts with insufficient information to code with codable children: Secondary | ICD-10-CM

## 2013-12-22 DIAGNOSIS — M1710 Unilateral primary osteoarthritis, unspecified knee: Secondary | ICD-10-CM

## 2013-12-22 LAB — GLUCOSE, CAPILLARY
Glucose-Capillary: 209 mg/dL — ABNORMAL HIGH (ref 70–99)
Glucose-Capillary: 254 mg/dL — ABNORMAL HIGH (ref 70–99)
Glucose-Capillary: 147 mg/dL — ABNORMAL HIGH (ref 70–99)

## 2013-12-22 LAB — BASIC METABOLIC PANEL
BUN: 48 mg/dL — ABNORMAL HIGH (ref 6–23)
CO2: 28 mEq/L (ref 19–32)
Calcium: 10.4 mg/dL (ref 8.4–10.5)
Chloride: 96 mEq/L (ref 96–112)
Creatinine, Ser: 2.95 mg/dL — ABNORMAL HIGH (ref 0.50–1.10)
GFR calc Af Amer: 18 mL/min — ABNORMAL LOW (ref >90)
GFR calc non Af Amer: 15 mL/min — ABNORMAL LOW (ref >90)
Glucose, Bld: 174 mg/dL — ABNORMAL HIGH (ref 70–99)
Potassium: 3.3 mEq/L — ABNORMAL LOW (ref 3.7–5.3)
Sodium: 142 mEq/L (ref 137–147)

## 2013-12-22 LAB — CBC
HCT: 37.5 % (ref 36.0–46.0)
Hemoglobin: 12.3 g/dL (ref 12.0–15.0)
MCH: 31.1 pg (ref 26.0–34.0)
MCHC: 32.8 g/dL (ref 30.0–36.0)
MCV: 94.7 fL (ref 78.0–100.0)
Platelets: 310 10*3/uL (ref 150–400)
RBC: 3.96 MIL/uL (ref 3.87–5.11)
RDW: 15.1 % (ref 11.5–15.5)
WBC: 9.9 10*3/uL (ref 4.0–10.5)

## 2013-12-22 MED ORDER — INSULIN ASPART 100 UNIT/ML ~~LOC~~ SOLN
10.0000 [IU] | Freq: Three times a day (TID) | SUBCUTANEOUS | Status: DC
  Administered 2013-12-22 – 2013-12-23 (×3): 10 [IU] via SUBCUTANEOUS

## 2013-12-22 MED ORDER — INSULIN GLARGINE 100 UNIT/ML ~~LOC~~ SOLN
30.0000 [IU] | Freq: Every day | SUBCUTANEOUS | Status: DC
  Administered 2013-12-22 – 2013-12-23 (×2): 30 [IU] via SUBCUTANEOUS
  Filled 2013-12-22 (×2): qty 0.3

## 2013-12-22 MED ORDER — FUROSEMIDE 80 MG PO TABS
160.0000 mg | ORAL_TABLET | Freq: Three times a day (TID) | ORAL | Status: DC
  Administered 2013-12-22 – 2013-12-23 (×4): 160 mg via ORAL
  Filled 2013-12-22 (×7): qty 2

## 2013-12-22 MED ORDER — POTASSIUM CHLORIDE CRYS ER 20 MEQ PO TBCR
40.0000 meq | EXTENDED_RELEASE_TABLET | Freq: Three times a day (TID) | ORAL | Status: DC
  Administered 2013-12-22 – 2013-12-23 (×4): 40 meq via ORAL
  Filled 2013-12-22 (×5): qty 2

## 2013-12-22 NOTE — Progress Notes (Signed)
Physical Therapy Treatment Patient Details Name: [name] MRN: [id number] DOB: 04-Aug-1946 Today's Date: 12/22/2013    History of Present Illness Pt is a 68 y.o. female s/p right TKA, with history of arthritis of left knee. Respiratory distress 4/16 requiring Bipap    PT Comments    Pt is progressing well towards functional goals. Pt able to tolerate amb in safe manner on this date. Pt req encouragement to progress with LE exercises to achieve end range. Pt educated on LE exercises, safety and healing process. Pt to benefit from skilled acute PT to address limitations listed below.     Follow Up Recommendations  SNF     Equipment Recommendations  None recommended by PT    Recommendations for Other Services       Precautions / Restrictions Precautions Precautions: Fall Required Braces or Orthoses: Knee Immobilizer - Right Knee Immobilizer - Right: On when out of bed or walking Restrictions Weight Bearing Restrictions: Yes RLE Weight Bearing: Weight bearing as tolerated LLE Weight Bearing: Weight bearing as tolerated    Mobility  Bed Mobility Overal bed mobility: Needs Assistance Bed Mobility: Supine to Sit;Sit to Supine     Supine to sit: Min guard Sit to supine: Min guard   General bed mobility comments: cues for sequencing. pt used bed rails and overhead bed trapeze bar to assist pulling up in bed.   Transfers Overall transfer level: Needs assistance Equipment used: Rolling walker (2 wheeled) Transfers: Sit to/from Stand Sit to Stand: Min guard         General transfer comment: verbal cues for hand placement and safety. cues for sequencing to RW. pt steady upright standing positioning with RW  Ambulation/Gait Ambulation/Gait assistance: Min guard Ambulation Distance (Feet): 50 Feet Assistive device: Rolling walker (2 wheeled) Gait Pattern/deviations: Step-to pattern;Decreased stride length;Decreased weight shift to right;Trunk flexed Gait velocity:  slow; guarded for pain   General Gait Details: pt reports feeling fatigued. pt required two standing rest breaks of approximately 30 each. verbal cues for step sequencing. pt amb with R knee immobilizer on this date.   Stairs            Wheelchair Mobility    Modified Rankin (Stroke Patients Only)       Balance Overall balance assessment: Needs assistance Sitting-balance support: No upper extremity supported;Feet supported Sitting balance-Leahy Scale: Fair Sitting balance - Comments: pt able to tolerate sitting EOB without UE support for two min prior to amb. pt req Bilat UE support to perform seated LE exercises    Standing balance support: Bilateral upper extremity supported Standing balance-Leahy Scale: Poor Standing balance comment: pt req RW for Bilat UE support.                     Cognition Arousal/Alertness: Awake/alert Behavior During Therapy: WFL for tasks assessed/performed Overall Cognitive Status: Within Functional Limits for tasks assessed                      Exercises Total Joint Exercises Ankle Circles/Pumps: AROM;Both;10 reps Quad Sets: AROM;Right;10 reps Heel Slides: AROM;AAROM;Right;10 reps;Seated Hip ABduction/ADduction: AROM;Right;10 reps;Supine Long Arc Quad: Strengthening;Right;10 reps;Seated    General Comments General comments (skin integrity, edema, etc.): pt's R LE skin is dry. Pt's O2 sats dropped to 91% during amb. pt educated on LE exercises for technique, frequency and set/reps. pt able to achieve 80 deg AAROM flexion in sitting. limited to pain      Pertinent Vitals/Pain Pt reports  L knee pain during LE exercises. Pt's O2 sats dropped to 91% during amb. RN aware.     Home Living                      Prior Function            PT Goals (current goals can now be found in the care plan section) Acute Rehab PT Goals Patient Stated Goal: Go home PT Goal Formulation: With patient Time For Goal Achievement:  12/23/13 Potential to Achieve Goals: Good Progress towards PT goals: Progressing toward goals    Frequency  7X/week    PT Plan Current plan remains appropriate    Co-evaluation             End of Session Equipment Utilized During Treatment: Gait belt;Right knee immobilizer Activity Tolerance: Patient tolerated treatment well Patient left: in bed;with call bell/phone within reach     Time: 1355-1423 PT Time Calculation (min): 28 min  Charges:                       G Codes:      Manley Mason SPT 01-15-2014, 2:43 PM

## 2013-12-22 NOTE — Progress Notes (Signed)
Subjective: 7 Days Post-Op Procedure(s) (LRB): RIGHT TOTAL KNEE ARTHROPLASTY (Right) Patient reports pain as 3 on 0-10 scale.   Taking by mouth and voiding okay.  Making good progress with physical therapy.  She reports that her breathing is much improved.  Objective: Vital signs in last 24 hours: Temp:  [97.6 F (36.4 C)-98.1 F (36.7 C)] 97.7 F (36.5 C) (04/20 0724) Pulse Rate:  [65-75] 72 (04/20 0724) Resp:  [12-18] 17 (04/20 0724) BP: (128-154)/(64-76) 143/76 mmHg (04/20 0724) SpO2:  [93 %-100 %] 94 % (04/20 0749) FiO2 (%):  [32 %-40 %] 40 % (04/20 0345) Weight:  [108.7 kg (239 lb 10.2 oz)] 108.7 kg (239 lb 10.2 oz) (04/20 0345)  Intake/Output from previous day: 04/19 0701 - 04/20 0700 In: 1480 [P.O.:1480] Out: 4650 [Urine:4650] Intake/Output this shift:     Recent Labs  12/20/13 0315 12/21/13 0355 12/22/13 0239  HGB 10.6* 11.3* 12.3    Recent Labs  12/21/13 0355 12/22/13 0239  WBC 10.2 9.9  RBC 3.66* 3.96  HCT 34.9* 37.5  PLT 286 310    Recent Labs  12/21/13 0355 12/22/13 0239  NA 142 142  K 3.7 3.3*  CL 99 96  CO2 27 28  BUN 37* 48*  CREATININE 2.61* 2.95*  GLUCOSE 195* 174*  CALCIUM 9.8 10.4   No results found for this basename: LABPT, INR,  in the last 72 hours Right knee exam: Range of motion 0 to 65.  Calf soft and nontender. Sensation intact distally Intact pulses distally Dorsiflexion/Plantar flexion intact Incision: no drainage Compartment soft  Assessment/Plan: 7 Days Post-Op Procedure(s) (LRB): RIGHT TOTAL KNEE ARTHROPLASTY (Right) Resolving ARF.  Managed by hospitalists. Plan: Discharge to SNF when stable from a medical viewpoint. Aspirin enteric-coated 325 mg twice daily x1 month postop. Weightbearing as tolerated. CPM to right knee 6-8 hours per day start at 0 to 65 increase 5-10 daily up to 100. Percocet 5 mg one or 2 every 6 hours as needed for pain. Daily physical therapy. Followup with Dr. Berenice Primas in 10  days.  Erlene Senters 12/22/2013, 8:36 AM

## 2013-12-22 NOTE — Progress Notes (Signed)
Physical Therapy Treatment Patient Details Name: [name] MRN: [id number] DOB: 10-26-1945 Today's Date: 12/22/2013    History of Present Illness Pt is a 68 y.o. female s/p right TKA, with history of arthritis of left knee. Respiratory distress 4/16 requiring Bipap    PT Comments    Pt is progressing well towards functional goals. Pt able to tolerate amb in safe manner on this date. Pt req encouragement to progress with LE exercises to achieve end range. Pt educated on LE exercises, safety and healing process. Pt to benefit from skilled acute PT to address limitations listed below.     Follow Up Recommendations  SNF     Equipment Recommendations  None recommended by PT    Recommendations for Other Services       Precautions / Restrictions Precautions Precautions: Fall Required Braces or Orthoses: Knee Immobilizer - Right Knee Immobilizer - Right: On when out of bed or walking Restrictions Weight Bearing Restrictions: Yes RLE Weight Bearing: Weight bearing as tolerated LLE Weight Bearing: Weight bearing as tolerated    Mobility  Bed Mobility Overal bed mobility: Needs Assistance Bed Mobility: Supine to Sit;Sit to Supine     Supine to sit: Min guard Sit to supine: Min guard   General bed mobility comments: cues for sequencing. pt used bed rails and overhead bed trapeze bar to assist pulling up in bed.   Transfers Overall transfer level: Needs assistance Equipment used: Rolling walker (2 wheeled) Transfers: Sit to/from Stand Sit to Stand: Min guard         General transfer comment: verbal cues for hand placement and safety. cues for sequencing to RW. pt steady upright standing positioning with RW  Ambulation/Gait Ambulation/Gait assistance: Min guard Ambulation Distance (Feet): 50 Feet Assistive device: Rolling walker (2 wheeled) Gait Pattern/deviations: Step-to pattern;Decreased stride length;Decreased weight shift to right;Trunk flexed Gait velocity:  slow; guarded for pain   General Gait Details: pt reports feeling fatigued. pt required two standing rest breaks of approximately 30 each. verbal cues for step sequencing. pt amb with R knee immobilizer on this date.   Stairs            Wheelchair Mobility    Modified Rankin (Stroke Patients Only)       Balance Overall balance assessment: Needs assistance Sitting-balance support: No upper extremity supported;Feet supported Sitting balance-Leahy Scale: Fair Sitting balance - Comments: pt able to tolerate sitting EOB without UE support for two min prior to amb. pt req Bilat UE support to perform seated LE exercises    Standing balance support: Bilateral upper extremity supported Standing balance-Leahy Scale: Poor Standing balance comment: pt req RW for Bilat UE support.                     Cognition Arousal/Alertness: Awake/alert Behavior During Therapy: WFL for tasks assessed/performed Overall Cognitive Status: Within Functional Limits for tasks assessed                      Exercises Total Joint Exercises Ankle Circles/Pumps: AROM;Both;10 reps Quad Sets: AROM;Right;10 reps Heel Slides: AROM;AAROM;Right;10 reps;Seated Hip ABduction/ADduction: AROM;Right;10 reps;Supine Long Arc Quad: Strengthening;Right;10 reps;Seated    General Comments General comments (skin integrity, edema, etc.): pt's R LE skin is dry. Pt's O2 sats dropped to 91% during amb. pt educated on LE exercises for technique, frequency and set/reps. pt able to achieve 80 deg AAROM flexion in sitting. limited to pain      Pertinent Vitals/Pain Pt reports  L knee pain during LE exercises. Pt's O2 sats dropped to 91% during amb. RN aware.     Home Living                      Prior Function            PT Goals (current goals can now be found in the care plan section) Acute Rehab PT Goals Patient Stated Goal: Go home PT Goal Formulation: With patient Time For Goal Achievement:  12/23/13 Potential to Achieve Goals: Good Progress towards PT goals: Progressing toward goals    Frequency  7X/week    PT Plan Current plan remains appropriate    Co-evaluation             End of Session Equipment Utilized During Treatment: Gait belt;Right knee immobilizer Activity Tolerance: Patient tolerated treatment well Patient left: in bed;with call bell/phone within reach     Time: 1355-1423 PT Time Calculation (min): 28 min  Charges:                       G Codes:      Manley Mason SPT 2014/01/17, 2:43 PM  Agree with above assessment.  Kittie Plater, PT, DPT Pager #: [phone] Office #: [phone]

## 2013-12-22 NOTE — Progress Notes (Signed)
Placed patient on CPAP for the night via auto-mode with minimum pressure set at 5cm and maximum pressure set at 20cm. Will continue to monitor patient

## 2013-12-22 NOTE — Progress Notes (Signed)
Utilization review completed.  

## 2013-12-22 NOTE — Progress Notes (Signed)
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  Request MD consider adding, in addition to correction scale, at least 3 units meal coverage tid with meals provided patient eats at least 50% and CBG is at least 80 mg/dl.  Thank you.  Laquinta Hazell S. Marcelline Mates, RN, CNS, CDE Inpatient Diabetes Program, team pager [phone]

## 2013-12-22 NOTE — Progress Notes (Signed)
PROGRESS NOTE  [name] [id number] DOB: 01/23/1946 DOA: 12/15/2013 PCP: [name], MD  This is a 68 yo Female who underwent knee replacement surgery, her recovery was complicated by a CHF exacerbation.  Her weight increased from 241 to 254.  She had an episode of desaturation and was palced on Bipap in the SDU.      Assessment/Plan: Acute resp failure due to acute diastolic heart failure-  Wean oxygen to room air. Stable to go to SNF when bed found. If not today, transfer to telemetry  OSA  Hypokalemia- resolved  DM- not optimally controlled. Increase lantus further  CKD- stable. Baseline CR in the 2s  S/p knee replacement surgery- continue PT, DVT prophylaxis  Fever resolved. no infection.   GERD   Code Status: full Family Communication: patient Disposition Plan: SNF    Procedures:  S/p knee replacement  Antibiotics:  levaquin 4/15-4/19  HPI/Subjective: Breathing back to baseline. Had stool  Objective: Filed Vitals:   12/22/13 0724  BP: 143/76  Pulse: 72  Temp: 97.7 F (36.5 C)  Resp: 17    Intake/Output Summary (Last 24 hours) at 12/22/13 0826 Last data filed at 12/22/13 0640  Gross per 24 hour  Intake   1280 ml  Output   4050 ml  Net  -2770 ml   Filed Weights   12/20/13 0341 12/21/13 0325 12/22/13 0345  Weight: 111.3 kg (245 lb 6 oz) 109 kg (240 lb 4.8 oz) 108.7 kg (239 lb 10.2 oz)    Exam:   General:  A+Ox3, NAD. In chair  Cardiovascular: rrr  Respiratory: diminished  Abdomen: +BS, soft  Musculoskeletal: right leg in brace   Data Reviewed: Basic Metabolic Panel:  Recent Labs Lab 12/18/13 0914 12/19/13 0337 12/20/13 0315 12/21/13 0355 12/22/13 0239  NA 141 145 146 142 142  K 3.7 3.3* 3.8 3.7 3.3*  CL 102 106 104 99 96  CO2 22 24 26 27 28   GLUCOSE 250* 136* 148* 195* 174*  BUN 31* 33* 34* 37* 48*  CREATININE 2.84* 2.82* 2.72* 2.61* 2.95*  CALCIUM 8.7 8.9 9.1 9.8 10.4   Liver Function Tests:  Recent  Labs Lab 12/15/13 1036  AST 20  ALT 12  ALKPHOS 60  BILITOT 0.4  PROT 7.4  ALBUMIN 3.7   No results found for this basename: LIPASE, AMYLASE,  in the last 168 hours No results found for this basename: AMMONIA,  in the last 168 hours CBC:  Recent Labs Lab 12/18/13 0456 12/19/13 0337 12/20/13 0315 12/21/13 0355 12/22/13 0239  WBC 13.6* 10.3 10.5 10.2 9.9  HGB 11.2* 11.1* 10.6* 11.3* 12.3  HCT 34.3* 34.4* 32.7* 34.9* 37.5  MCV 94.5 95.6 95.9 95.4 94.7  PLT 187 200 234 286 310   Cardiac Enzymes: No results found for this basename: CKTOTAL, CKMB, CKMBINDEX, TROPONINI,  in the last 168 hours BNP (last 3 results) No results found for this basename: PROBNP,  in the last 8760 hours CBG:  Recent Labs Lab 12/21/13 0802 12/21/13 1123 12/21/13 1633 12/21/13 2149 12/22/13 0741  GLUCAP 192* 312* 232* 221* 209*    Recent Results (from the past 240 hour(s))  CULTURE, BLOOD (ROUTINE X 2)     Status: None   Collection Time    12/18/13  8:44 PM      Result Value Ref Range Status   Specimen Description BLOOD RIGHT HAND   Final   Special Requests BOTTLES DRAWN AEROBIC ONLY 10CC   Final   Culture  Setup Time  Final   Value: 12/19/2013 04:24     Performed at [facility]   Culture     Final   Value:        BLOOD CULTURE RECEIVED NO GROWTH TO DATE CULTURE WILL BE HELD FOR 5 DAYS BEFORE ISSUING A FINAL NEGATIVE REPORT     Performed at [facility]   Report Status PENDING   Incomplete  CULTURE, BLOOD (ROUTINE X 2)     Status: None   Collection Time    12/18/13  8:49 PM      Result Value Ref Range Status   Specimen Description BLOOD RIGHT HAND   Final   Special Requests BOTTLES DRAWN AEROBIC ONLY 10CC   Final   Culture  Setup Time     Final   Value: 12/19/2013 04:24     Performed at [facility]   Culture     Final   Value:        BLOOD CULTURE RECEIVED NO GROWTH TO DATE CULTURE WILL BE HELD FOR 5 DAYS BEFORE ISSUING A FINAL NEGATIVE REPORT      Performed at [facility]   Report Status PENDING   Incomplete     Studies: No results found.  Scheduled Meds: . albuterol  2.5 mg Nebulization Q6H  . amitriptyline  25 mg Oral QHS  . aspirin EC  325 mg Oral BID  . atorvastatin  20 mg Oral q1800  . carvedilol  3.125 mg Oral BID WC  . clopidogrel  75 mg Oral Q breakfast  . febuxostat  40 mg Oral Daily  . insulin aspart  0-15 Units Subcutaneous TID WC  . insulin aspart  0-5 Units Subcutaneous QHS  . insulin glargine  20 Units Subcutaneous Daily  . Linaclotide  290 mcg Oral Daily  . pantoprazole  40 mg Oral Daily  . polyethylene glycol  17 g Oral BID  . potassium chloride SA  40 mEq Oral TID  . senna-docusate  1 tablet Oral BID   Continuous Infusions:   Antibiotics Given (last 72 hours)   Date/Time Action Medication Dose   12/19/13 1448 Given   levofloxacin (LEVAQUIN) tablet 750 mg 750 mg     Time spent: 25 min  Delfina Redwood, M.D. [facility] Pager [phone]. If 7PM-7AM, please contact night-coverage at [email or web address], password [facility] 12/22/2013, 8:26 AM  LOS: 7 days

## 2013-12-22 NOTE — Care Management Note (Signed)
    Page 1 of 2   12/22/2013     11:56:43 AM CARE MANAGEMENT NOTE 12/22/2013  Patient:  [name], [name]   Account Number:  [id number]  Date Initiated:  12/16/2013  Documentation initiated by:  Ricki Miller  Subjective/Objective Assessment:   68 yr old female s/p right total knee arthroplasty.     Action/Plan:   Case manager spoke with patient concerning home health and DME needs at discharge. Patient preoperatively setup with [facility], no changes. DME has been delivered. Has family support at discharge.   Anticipated DC Date:  12/23/2013   Anticipated DC Plan:  [place]  CM consult      [facility] Choice  HOME HEALTH  DURABLE MEDICAL EQUIPMENT   Choice offered to / List presented to:  C-1 Patient   DME arranged  3-N-1  [place]  CPM      DME agency  [employer]     [place] arranged  HH-2 PT      [facility] agency  [place]   Status of service:  In process, will continue to follow Medicare Important Message given?   (If response is "NO", the following Medicare IM given date fields will be blank) Date Medicare IM given:   Date Additional Medicare IM given:    Discharge Disposition:  [place]  Per UR Regulation:    If discussed at Long Length of Stay Meetings, dates discussed:    Comments:  12/22/13- [place] RN, BSN [phone] PT recommendation for SNF- CSW has been following for ST- SNF placement- as of this morning pt and daughters now want to take pt home with [facility] as previously arranged preop with [name]. Per conversation with pt and both her daughters they all want to return pt home with daughter Levonne Lapping, they have needed DME that includes CPM, RW, BSC at home- the had [facility] arranged for PT/OT with [name] this was all done preop- pt now would also benefit from an RN and aide at discharge- MD please order if agree - HH-RN/PT/OT/aide for [facility] services. (will fax updated order to [facility]-  [phone] when available in epic) - per daughters would like pt to be transported home via ambulance- CSW aware- also pt has cpap at home but does not wear home 02 will have RN check for 02 sats to see if pt will qualify for home 02- explained to pt's daughters that pt would need to qualify in order for insurance to pay for home 02- they voiced understanding. Call made to [facility] to confirm referral was in place- pt for possible d/c 12/23/13 Dr. Leonides Schanz aware of pt and family decision for home with [facility].

## 2013-12-22 NOTE — Clinical Social Work Note (Signed)
Patient and family have stated that patient is discharging home with HHPT which was set up prior to hospitalization with [facility]. Per MD patient should be ready to DC home tomorrow. Patient will require ambulance transport home. Patient has transferred to 5N, CSW will update unit CSW in am.  Liz Beach MSW, [place], [place], [phone]

## 2013-12-22 NOTE — Progress Notes (Signed)
Delay in pt transfer to 5N d/t lunch arriving, PT session, and RT treatment.

## 2013-12-22 NOTE — Progress Notes (Signed)
SATURATION QUALIFICATIONS: (This note is used to comply with regulatory documentation for home oxygen)  Patient Saturations on Room Air at Rest = 96%  Patient Saturations on Room Air while Ambulating = 92%  No home oxygen needs.

## 2013-12-23 DIAGNOSIS — I5033 Acute on chronic diastolic (congestive) heart failure: Secondary | ICD-10-CM

## 2013-12-23 LAB — BASIC METABOLIC PANEL
BUN: 56 mg/dL — ABNORMAL HIGH (ref 6–23)
CO2: 26 mEq/L (ref 19–32)
Calcium: 10.8 mg/dL — ABNORMAL HIGH (ref 8.4–10.5)
Chloride: 95 mEq/L — ABNORMAL LOW (ref 96–112)
Creatinine, Ser: 2.96 mg/dL — ABNORMAL HIGH (ref 0.50–1.10)
GFR calc Af Amer: 18 mL/min — ABNORMAL LOW (ref >90)
GFR calc non Af Amer: 15 mL/min — ABNORMAL LOW (ref >90)
Glucose, Bld: 215 mg/dL — ABNORMAL HIGH (ref 70–99)
Potassium: 4.1 mEq/L (ref 3.7–5.3)
Sodium: 141 mEq/L (ref 137–147)

## 2013-12-23 LAB — GLUCOSE, CAPILLARY
Glucose-Capillary: 206 mg/dL — ABNORMAL HIGH (ref 70–99)
Glucose-Capillary: 209 mg/dL — ABNORMAL HIGH (ref 70–99)

## 2013-12-23 MED ORDER — OXYCODONE-ACETAMINOPHEN 5-325 MG PO TABS: 1.0000 | ORAL_TABLET | Freq: Four times a day (QID) | ORAL | Status: DC | PRN

## 2013-12-23 MED ORDER — METHOCARBAMOL 750 MG PO TABS: 750.0000 mg | ORAL_TABLET | Freq: Three times a day (TID) | ORAL | Status: DC | PRN

## 2013-12-23 MED ORDER — INSULIN LISPRO 100 UNIT/ML ~~LOC~~ SOLN: 20.0000 [IU] | Freq: Three times a day (TID) | SUBCUTANEOUS | Status: DC

## 2013-12-23 MED ORDER — INSULIN GLARGINE 100 UNIT/ML ~~LOC~~ SOLN: 40.0000 [IU] | Freq: Every day | SUBCUTANEOUS | Status: DC

## 2013-12-23 MED ORDER — ASPIRIN EC 325 MG PO TBEC: 325.0000 mg | DELAYED_RELEASE_TABLET | Freq: Two times a day (BID) | ORAL | Status: DC

## 2013-12-23 NOTE — Progress Notes (Signed)
Orthopedic Tech Progress Note Patient Details:  [name] Sep 10, 1945 [id number] Patient currently preparing for discharge. Will pick up patient's CPM and other Ortho equipment once she is released. CPM Right Knee CPM Right Knee: Off Right Knee Flexion (Degrees): 60 Right Knee Extension (Degrees): 0 Additional Comments: off at [employer] 12/23/2013, 2:39 PM

## 2013-12-23 NOTE — Progress Notes (Signed)
Discharge instructions and prescriptions given to and reviewed with patient.  Patient denies questions or concerns. IV removed with no complications. Patient discharged via EMS to home with personal belongings, discharge packet, prescriptions.

## 2013-12-23 NOTE — Progress Notes (Signed)
Subjective: 8 Days Post-Op Procedure(s) (LRB): RIGHT TOTAL KNEE ARTHROPLASTY (Right) Patient reports pain as mild.    Objective: Vital signs in last 24 hours: Temp:  [97.9 F (36.6 C)-98.2 F (36.8 C)] 97.9 F (36.6 C) (04/21 0521) Pulse Rate:  [69-82] 82 (04/21 0521) Resp:  [13-16] 16 (04/21 0521) BP: (131-154)/(78-89) 131/86 mmHg (04/21 0521) SpO2:  [95 %-97 %] 96 % (04/21 0521) Weight:  [226 lb 13.7 oz (102.9 kg)] 226 lb 13.7 oz (102.9 kg) (04/21 0500)  Intake/Output from previous day: 04/20 0701 - 04/21 0700 In: 540 [P.O.:540] Out: 1250 [Urine:1250] Intake/Output this shift:     Recent Labs  12/21/13 0355 12/22/13 0239  HGB 11.3* 12.3    Recent Labs  12/21/13 0355 12/22/13 0239  WBC 10.2 9.9  RBC 3.66* 3.96  HCT 34.9* 37.5  PLT 286 310    Recent Labs  12/22/13 0239 12/23/13 0540  NA 142 141  K 3.3* 4.1  CL 96 95*  CO2 28 26  BUN 48* 56*  CREATININE 2.95* 2.96*  GLUCOSE 174* 215*  CALCIUM 10.4 10.8*   No results found for this basename: LABPT, INR,  in the last 72 hours  Neurologically intact ABD soft Neurovascular intact Sensation intact distally Intact pulses distally Dorsiflexion/Plantar flexion intact Incision: dressing C/D/I No cellulitis present Compartment soft  Assessment/Plan: 8 Days Post-Op Procedure(s) (LRB): RIGHT TOTAL KNEE ARTHROPLASTY (Right) Advance diet Up with therapy Discharge home with home health The patient has had significant respiratory issues which seem to be completely resolved at this point.  Her knee is doing great.  She wants to go home and I think given how she's doing today on a little bit concerned because of the road that she's had in the hospital, I do think that she is ready to go home today and should do well.  I'll see her back in the office in a week for evaluation.  She has her number and knows to call should there be any issues that arise.  Alta Corning 12/23/2013, 11:19 AM

## 2013-12-23 NOTE — Progress Notes (Signed)
Patient evaluated for community based chronic disease management services with [facility] as a benefit of patient's [employer]. Spoke with patient at bedside to explain [facility].  Patient request that her daughter Lavella Lemons make the decisions.  Called Juanita Craver [phone] for verbal consent for services.  Daughter is primary contact for services.  [facility] is active for nursing therayp and DME.  Daughter cares for a husband in her home who is a quadraplegic and will share the 24/7 care needs of her mother with her sister Gillian Scarce.  Patient will receive a post discharge transition of care call and will be evaluated for monthly home visits for assessments and disease process education.  Left contact information and [facility] literature at bedside. Of note, [facility] Care Management services does not replace or interfere with any services that are arranged by inpatient case management or social work.  For additional questions or referrals please contact Corliss Blacker BSN RN [facility] Liaison at [phone].

## 2013-12-23 NOTE — Discharge Summary (Signed)
Physician Discharge Summary  [name] [id number] DOB: 31-Dec-1945 DOA: 12/15/2013  PCP: [name], MD  Admit date: 12/15/2013 Discharge date: 12/23/2013  Time spent: greater than 30 minutes  Recommendations for Outpatient Follow-up:  1. Home PT, OT, RN, aide arranged  Discharge Diagnoses:  Principal Problem:   Osteoarthritis of left knee Active Problems:   GERD (gastroesophageal reflux disease)   Obstructive sleep apnea   Osteoarthritis of right knee   Acute respiratory failure   Chronic kidney disease (CKD), stage IV (severe)   Diabetes   Acute on chronic diastolic heart failure   Discharge Condition: stable  Filed Weights   12/21/13 0325 12/22/13 0345 12/23/13 0500  Weight: 109 kg (240 lb 4.8 oz) 108.7 kg (239 lb 10.2 oz) 102.9 kg (226 lb 13.7 oz)    History of present illness:  68 y.o. female, has a history of pain and functional disability in the right knee due to arthritis and has failed non-surgical conservative treatments for greater than 12 weeks to includeNSAID's and/or analgesics, corticosteriod injections, viscosupplementation injections, use of assistive devices, weight reduction as appropriate and activity modification. Onset of symptoms was gradual, starting 3 years ago with gradually worsening course since that time. The patient noted no past surgery on the right knee(s). Patient currently rates pain in the right knee(s) at 8 out of 10 with activity. Patient has night pain, worsening of pain with activity and weight bearing, pain that interferes with activities of daily living, pain with passive range of motion, crepitus and joint swelling. Patient has evidence of subchondral cysts, subchondral sclerosis, joint subluxation and joint space narrowing by imaging studies. This patient has had failure of conservativwe care. There is no active infection.  Hospital Course:  Patient was initially admitted to orthopedics. Had right TKR by Dr. Berenice Primas.  Postoperatively, developed dyspnea and acute hypoxic respiratory failure secondary to acute on chronic diastolic heart failure.  [facility] hospitalists were consulted on 4/15 and assumed care. Patient was started on bipap, iv diuretics, and transferred to stepdown unit.  Dr. Einar Gip, patient's cardiologist was contacted by Dr. Eliseo Squires. He made recommendations about diuretic choice.  Eventually, patient was able to be weaned to room air.  By discharge, breathing at baseline.  SNF is recommended, but patient refuses.  Home health arranged. Orthopedics recommends ASA 325 bid for dvt prophylaxis.  Procedures:  Right TKR  NIPPV  Consultations:  orthoopedics  Discharge Exam: Filed Vitals:   12/23/13 0521  BP: 131/86  Pulse: 82  Temp: 97.9 F (36.6 C)  Resp: 16    General: comfortable Cardiovascular: RRR Respiratory: CTA Ext no edema   Discharge Orders   Future Appointments Provider Department Dept Phone   05/01/2014 9:15 AM Hayden Pedro, MD [place] [phone]   Future Orders Complete By Expires   CPM  As directed    Scheduling Instructions:   Use CPM on her right leg 6-8 hours per day.  Start at 0 to 1.  Increase 5-10 daily up to 100 of flexion.   Diet Carb Modified  As directed    Increase activity slowly  As directed    May shower / Bathe  As directed    Weight bearing as tolerated  As directed    Questions:     Laterality:  right   Extremity:  Lower   Weight bearing as tolerated  As directed    Questions:     Laterality:     Extremity:  Medication List    STOP taking these medications       aspirin 81 MG tablet  Replaced by:  aspirin EC 325 MG tablet      TAKE these medications       acetaminophen 500 MG tablet  Commonly known as:  TYLENOL  Take 500 mg by mouth every 6 (six) hours as needed for mild pain.     albuterol 108 (90 BASE) MCG/ACT inhaler  Commonly known as:  PROVENTIL HFA;VENTOLIN HFA  Inhale 2 puffs into the  lungs every 4 (four) hours as needed for wheezing.     amitriptyline 25 MG tablet  Commonly known as:  ELAVIL  Take 25 mg by mouth at bedtime.     amLODipine 10 MG tablet  Commonly known as:  NORVASC  Take 10 mg by mouth daily.     aspirin EC 325 MG tablet  Take 1 tablet (325 mg total) by mouth 2 (two) times daily after a meal. Take x 3 weeks.     carvedilol 25 MG tablet  Commonly known as:  COREG  Take 37.5 mg by mouth 2 (two) times daily with a meal.     cloNIDine 0.3 MG tablet  Commonly known as:  CATAPRES  Take 0.3 mg by mouth 2 (two) times daily.     clopidogrel 75 MG tablet  Commonly known as:  PLAVIX  Take 75 mg by mouth daily.     dicyclomine 10 MG capsule  Commonly known as:  BENTYL  Take 10 mg by mouth 4 (four) times daily as needed (IBS).     esomeprazole 40 MG capsule  Commonly known as:  NEXIUM  Take 40 mg by mouth daily at 12 noon.     febuxostat 40 MG tablet  Commonly known as:  ULORIC  Take 40 mg by mouth daily.     furosemide 80 MG tablet  Commonly known as:  LASIX  Take 160 mg by mouth 3 (three) times daily.     insulin glargine 100 UNIT/ML injection  Commonly known as:  LANTUS  Inject 0.4 mLs (40 Units total) into the skin daily.     insulin lispro 100 UNIT/ML injection  Commonly known as:  HUMALOG  Inject 0.2 mLs (20 Units total) into the skin 3 (three) times daily after meals.     Linaclotide 145 MCG Caps capsule  Commonly known as:  LINZESS  Take 290 mcg by mouth daily.     methocarbamol 750 MG tablet  Commonly known as:  ROBAXIN-750  Take 1 tablet (750 mg total) by mouth every 8 (eight) hours as needed for muscle spasms.     nitroGLYCERIN 0.4 MG SL tablet  Commonly known as:  NITROSTAT  Place 0.4 mg under the tongue every 5 (five) minutes as needed for chest pain.     oxyCODONE-acetaminophen 5-325 MG per tablet  Commonly known as:  PERCOCET/ROXICET  Take 1-2 tablets by mouth every 6 (six) hours as needed for severe pain.      potassium chloride SA 20 MEQ tablet  Commonly known as:  K-DUR,KLOR-CON  Take 20 mEq by mouth daily.     ranitidine 150 MG tablet  Commonly known as:  ZANTAC  Take 1 tablet (150 mg total) by mouth at bedtime.     rosuvastatin 10 MG tablet  Commonly known as:  CRESTOR  Take 10 mg by mouth daily.     zolpidem 10 MG tablet  Commonly known as:  AMBIEN  Take 10  mg by mouth at bedtime as needed for sleep.       Allergies  Allergen Reactions  . Codeine Nausea And Vomiting  . Penicillins Nausea And Vomiting  . Sulfa Antibiotics Itching and Nausea And Vomiting    "everything I seen was red"  . Other Itching    Adhesive from ekg leads       Follow-up Information   Follow up with GRAVES,JOHN L, MD. Schedule an appointment as soon as possible for a visit in 2 weeks.   Specialty:  Orthopedic Surgery   Contact information:   [place] [address] [phone]       Follow up with [place]. (Someone from [facility] will contact you concerning start day and time for physical therapy.)    Specialty:  Home Health Services   Contact information:   [address] [place] [address] [phone]       Follow up with GRAVES,JOHN L, MD. Schedule an appointment as soon as possible for a visit in 10 days.   Specialty:  Orthopedic Surgery   Contact information:   [address] [place] [address] [phone]       Follow up with [name], MD In 4 weeks.   Specialty:  Internal Medicine   Contact information:   [place], [place]  [address] [phone]        The results of significant diagnostics from this hospitalization (including imaging, microbiology, ancillary and laboratory) are listed below for reference.    Significant Diagnostic Studies: Dg Chest 2 View  12/17/2013   CLINICAL DATA:  Shortness of breath, hypoxia  EXAM: CHEST  2 VIEW  COMPARISON:  DG CHEST 1V PORT dated 12/17/2013   FINDINGS: There are bilateral interstitial and alveolar airspace opacities. There is no pleural effusion or pneumothorax. Stable cardiomediastinal silhouette. Unremarkable osseous structures.  IMPRESSION: Overall findings concerning for pulmonary edema.   Electronically Signed   By: Kathreen Devoid   On: 12/17/2013 21:15   Dg Chest 2 View  12/09/2013   CLINICAL DATA:  For right told knee arthroplasty. History of hypertension.  EXAM: CHEST  2 VIEW  COMPARISON:  01/12/2009  FINDINGS: Cardiac silhouette is normal in size. Aorta is mildly uncoiled. No mediastinal or hilar masses or adenopathy.  There are prominent lung markings, stable. No lung consolidation, nodule or edema. No pleural effusion or pneumothorax.  Bony thorax is demineralized but intact.  IMPRESSION: No acute cardiopulmonary disease.   Electronically Signed   By: Lajean Manes M.D.   On: 12/09/2013 13:51   Dg Chest Port 1 View  12/20/2013   CLINICAL DATA:  Hypoxia with fluid overload  EXAM: PORTABLE CHEST - 1 VIEW  COMPARISON:  DG CHEST 2 VIEW dated 12/17/2013; DG CHEST 1V PORT dated 12/17/2013; DG CHEST 2 VIEW dated 05/31/2008  FINDINGS: Examination is degraded due to patient body habitus and portable technique. Grossly unchanged enlarged cardiac silhouette and mediastinal contours given reduced lung volumes. The pulmonary vasculature appears indistinct with cephalization of flow. Worsening bilateral mid and lower lung heterogeneous/consolidative opacities, left greater than right. Trace bilateral effusions are not excluded. No definite pneumothorax. Unchanged bones.  IMPRESSION: Worsening extensive bilateral heterogeneous airspace opacities, left greater than right, possibly asymmetric pulmonary edema although underlying infection is not excluded.   Electronically Signed   By: Sandi Mariscal M.D.   On: 12/20/2013 08:59   Dg Chest Port 1 View  12/17/2013   CLINICAL DATA:  decreased o2 sat post op  EXAM: PORTABLE CHEST - 1 VIEW  COMPARISON:  DG CHEST 2  VIEW dated 12/09/2013  FINDINGS: The cardiac silhouette is mild-to-moderately enlarged. Aorta is tortuous and ectatic. There is prominence of the interstitial markings, and peribronchial cuffing. An area of increased density is appreciated within the left lung base. The osseous structures unremarkable.  IMPRESSION: Pulmonary edema  Focal infiltrate left lung base differential considerations are infectious pneumonitis versus asymmetric edema. Repeat surveillance evaluation with two-view chest radiograph is recommended.   Electronically Signed   By: Margaree Mackintosh M.D.   On: 12/17/2013 10:41    Microbiology: Recent Results (from the past 240 hour(s))  CULTURE, BLOOD (ROUTINE X 2)     Status: None   Collection Time    12/18/13  8:44 PM      Result Value Ref Range Status   Specimen Description BLOOD RIGHT HAND   Final   Special Requests BOTTLES DRAWN AEROBIC ONLY 10CC   Final   Culture  Setup Time     Final   Value: 12/19/2013 04:24     Performed at [facility]   Culture     Final   Value:        BLOOD CULTURE RECEIVED NO GROWTH TO DATE CULTURE WILL BE HELD FOR 5 DAYS BEFORE ISSUING A FINAL NEGATIVE REPORT     Performed at [facility]   Report Status PENDING   Incomplete  CULTURE, BLOOD (ROUTINE X 2)     Status: None   Collection Time    12/18/13  8:49 PM      Result Value Ref Range Status   Specimen Description BLOOD RIGHT HAND   Final   Special Requests BOTTLES DRAWN AEROBIC ONLY 10CC   Final   Culture  Setup Time     Final   Value: 12/19/2013 04:24     Performed at [facility]   Culture     Final   Value:        BLOOD CULTURE RECEIVED NO GROWTH TO DATE CULTURE WILL BE HELD FOR 5 DAYS BEFORE ISSUING A FINAL NEGATIVE REPORT     Performed at [facility]   Report Status PENDING   Incomplete     Labs: Basic Metabolic Panel:  Recent Labs Lab 12/19/13 0337 12/20/13 0315 12/21/13 0355 12/22/13 0239 12/23/13 0540  NA 145 146 142 142 141  K 3.3* 3.8  3.7 3.3* 4.1  CL 106 104 99 96 95*  CO2 24 26 27 28 26   GLUCOSE 136* 148* 195* 174* 215*  BUN 33* 34* 37* 48* 56*  CREATININE 2.82* 2.72* 2.61* 2.95* 2.96*  CALCIUM 8.9 9.1 9.8 10.4 10.8*   Liver Function Tests: No results found for this basename: AST, ALT, ALKPHOS, BILITOT, PROT, ALBUMIN,  in the last 168 hours No results found for this basename: LIPASE, AMYLASE,  in the last 168 hours No results found for this basename: AMMONIA,  in the last 168 hours CBC:  Recent Labs Lab 12/18/13 0456 12/19/13 0337 12/20/13 0315 12/21/13 0355 12/22/13 0239  WBC 13.6* 10.3 10.5 10.2 9.9  HGB 11.2* 11.1* 10.6* 11.3* 12.3  HCT 34.3* 34.4* 32.7* 34.9* 37.5  MCV 94.5 95.6 95.9 95.4 94.7  PLT 187 200 234 286 310   Cardiac Enzymes: No results found for this basename: CKTOTAL, CKMB, CKMBINDEX, TROPONINI,  in the last 168 hours BNP: BNP (last 3 results) No results found for this basename: PROBNP,  in the last  8760 hours CBG:  Recent Labs Lab 12/22/13 0741 12/22/13 1152 12/22/13 2151 12/23/13 0633 12/23/13 1148  GLUCAP 209* 254* 147* 206* 209*    Signed:  [facility] 12/23/2013, 2:15 PM

## 2013-12-23 NOTE — Progress Notes (Signed)
CSW (Clinical Education officer, museum) informed pt will need transport home. CSW spoke with pt and confirmed address, and RN CM confirmed with family they were at home. CSW arranged non-emergent ambulance. Pt, pt family, and pt nurse informed. CSW signing off.  [name], [place]

## 2013-12-23 NOTE — Progress Notes (Signed)
Physical Therapy Treatment Patient Details Name: [name] MRN: [id number] DOB: Jun 22, 1946 Today's Date: 12/23/2013    History of Present Illness Pt is a 68 y.o. female s/p right TKA, with history of arthritis of left knee. Respiratory distress 4/16 requiring Bipap    PT Comments    Pt is making steady progress towards mobility goals. D/C plan is now for home with family. Therapist have been recommending SNF. Agree with HHPT if pt has 24 hour assistance with mobility. Pt is currently unable to negotiate the steps and demonstrates decreased activity tolerance. Pt is only able to walk about 35 feet and then requires a sitting rest break. Pt reported she will have an ambulance transport home possibly today who will assist her up the flight of steps. Recommend continued PT in the acute setting and if d/c home then HHPT follow-up.  Follow Up Recommendations  Home health PT;SNF (Recommending SNF, but family is requesting d/c home with HHPT)     Equipment Recommendations  None recommended by PT    Recommendations for Other Services       Precautions / Restrictions Precautions Precautions: Fall Required Braces or Orthoses: Knee Immobilizer - Right Restrictions RLE Weight Bearing: Weight bearing as tolerated    Mobility  Bed Mobility Overal bed mobility: Needs Assistance Bed Mobility: Supine to Sit     Supine to sit: Supervision     General bed mobility comments:  pt used bed rails to assist pulling up in bed.   Transfers Overall transfer level: Needs assistance Equipment used: Rolling walker (2 wheeled) Transfers: Sit to/from Stand Sit to Stand: Min guard         General transfer comment: verbal cues for hand placement and safety. cues for sequencing to RW. pt steady upright standing positioning with RW  Ambulation/Gait Ambulation/Gait assistance: Min guard Ambulation Distance (Feet): 35 Feet (2 trials) Assistive device: Rolling walker (2 wheeled) Gait  Pattern/deviations: Step-through pattern;Decreased stride length;Trunk flexed   Gait velocity interpretation: Below normal speed for age/gender General Gait Details: Pt presents with fatigue and decreased endurance with gait. Pt required cues to stay inside RW for increased safety.    Stairs Stairs:  (NT-pt reported she is going home in ambulance )          Wheelchair Mobility    Modified Rankin (Stroke Patients Only)       Balance     Sitting balance-Leahy Scale: Normal       Standing balance-Leahy Scale: Fair                      Cognition Arousal/Alertness: Awake/alert Behavior During Therapy: WFL for tasks assessed/performed Overall Cognitive Status: No family/caregiver present to determine baseline cognitive functioning (pt a little slow to process information and problem solve.)                      Exercises Total Joint Exercises Ankle Circles/Pumps: AROM;Strengthening;Both;10 reps;Seated Quad Sets: AROM;Strengthening;Right;10 reps;Supine Hip ABduction/ADduction: AAROM;Strengthening;Right;10 reps;Supine Straight Leg Raises: AAROM;Strengthening;Right;10 reps;Supine Long Arc Quad: AROM;Right;10 reps;Seated Knee Flexion: AAROM;Strengthening;Right;10 reps;Seated Goniometric ROM: 65* seated AAROM    General Comments        Pertinent Vitals/Pain     Home Living                      Prior Function            PT Goals (current goals can now be found in the care plan  section) Progress towards PT goals: Progressing toward goals    Frequency  7X/week    PT Plan Discharge plan needs to be updated (pt d/c home not SNF)    Co-evaluation             End of Session Equipment Utilized During Treatment: Gait belt;Right knee immobilizer Activity Tolerance: Patient limited by fatigue Patient left: in chair;with call bell/phone within reach     Time: 1055-1119 PT Time Calculation (min): 24 min  Charges:  $Gait Training:  8-22 mins $Therapeutic Exercise: 8-22 mins                    G Codes:      Colon Flattery Neko Mcgeehan 12/23/2013, 11:33 AM

## 2013-12-23 NOTE — Care Management Note (Signed)
CARE MANAGEMENT NOTE 12/23/2013  Patient:  [name], [name]   Account Number:  [id number]  Date Initiated:  12/16/2013  Documentation initiated by:  Ricki Miller  Subjective/Objective Assessment:   68 yr old female s/p right total knee arthroplasty.     Action/Plan:   Case manager spoke with patient concerning home health and DME needs at discharge. Patient preoperatively setup with [facility], no changes. DME has been delivered. Has family support at discharge.   Anticipated DC Date:  12/23/2013   Anticipated DC Plan:  [place]  CM consult      [facility]  HOME HEALTH  DURABLE MEDICAL EQUIPMENT   Choice offered to / List presented to:  C-1 Patient   DME arranged  3-N-1  [place]  CPM      DME agency  [employer]     [place] arranged  HH-2 PT      [facility] agency  [place]   Status of service:  Completed, signed off Medicare Important Message given?   (If response is "NO", the following Medicare IM given date fields will be blank) Date Medicare IM given:   Date Additional Medicare IM given:    Discharge Disposition:  [place]  Per UR Regulation:  Reviewed for med. necessity/level of care/duration of stay  If discussed at [place] of Stay Meetings, dates discussed:   12/23/2013    Comments:  12/23/13 1400 Lenard Lance, RN BSN Case Manager Patient to be discharged home via [facility]. Case Manager spoke with daughter [name] to confirm that someone would be present at the [place] address.

## 2013-12-25 ENCOUNTER — Encounter (HOSPITAL_COMMUNITY): Payer: Self-pay | Admitting: Orthopedic Surgery

## 2013-12-25 LAB — CULTURE, BLOOD (ROUTINE X 2)
Culture: NO GROWTH
Culture: NO GROWTH

## 2013-12-25 NOTE — Addendum Note (Signed)
Addendum created 12/25/13 1403 by Laurie Panda, MD   Modules edited: Anesthesia Events

## 2013-12-26 NOTE — Progress Notes (Signed)
Discussed with PTA and agree with treatment.  [address] [place], [place]

## 2014-01-16 ENCOUNTER — Other Ambulatory Visit: Payer: Self-pay | Admitting: Internal Medicine

## 2014-01-16 ENCOUNTER — Telehealth: Payer: Self-pay | Admitting: *Deleted

## 2014-01-16 NOTE — Telephone Encounter (Signed)
We have gotten a refill request from [facility] for patient to get refills of Nexium. However, per the pharmacy, last rx was given 07/11/13. This coincides with our records. I advised patient that at her last endoscopy, she had "complete" [name]'s esophagus. I also explained that if patient does not take her PPI's as prescribed, there is a higher likelyhood of her developing throat cancer. Advised that this is why it is so important for her to take per medication every single day. Patient verbalizes understanding. I will go ahead and send a new rx to patient's pharmacy for the Nexium.

## 2014-02-27 ENCOUNTER — Encounter (HOSPITAL_COMMUNITY): Payer: Self-pay

## 2014-03-03 ENCOUNTER — Encounter (HOSPITAL_COMMUNITY)
Admission: RE | Disposition: A | Discharge status: Home / Self Care | Payer: Self-pay | Source: Ambulatory Visit | Attending: Cardiology

## 2014-03-03 ENCOUNTER — Ambulatory Visit (HOSPITAL_COMMUNITY): Payer: Medicare HMO | Admitting: Certified Registered Nurse Anesthetist

## 2014-03-03 ENCOUNTER — Encounter (HOSPITAL_COMMUNITY): Payer: Self-pay | Admitting: Anesthesiology

## 2014-03-03 ENCOUNTER — Encounter (HOSPITAL_COMMUNITY): Payer: Self-pay | Admitting: Emergency Medicine

## 2014-03-03 ENCOUNTER — Emergency Department (HOSPITAL_COMMUNITY): Payer: Medicare HMO

## 2014-03-03 ENCOUNTER — Encounter (HOSPITAL_COMMUNITY): Payer: Medicare HMO | Admitting: Certified Registered Nurse Anesthetist

## 2014-03-03 ENCOUNTER — Ambulatory Visit (HOSPITAL_COMMUNITY)
Admission: RE | Admit: 2014-03-03 | Discharge: 2014-03-03 | Disposition: A | Discharge status: Home / Self Care | Payer: Medicare HMO | Source: Ambulatory Visit | Attending: Cardiology | Admitting: Cardiology

## 2014-03-03 ENCOUNTER — Inpatient Hospital Stay (HOSPITAL_COMMUNITY)
Admission: EM | Admit: 2014-03-03 | Discharge: 2014-03-13 | DRG: 291 | Disposition: A | Discharge status: Skilled Nursing Facility | Payer: Medicare HMO | Attending: Internal Medicine | Admitting: Internal Medicine

## 2014-03-03 DIAGNOSIS — K7689 Other specified diseases of liver: Secondary | ICD-10-CM | POA: Diagnosis present

## 2014-03-03 DIAGNOSIS — E1165 Type 2 diabetes mellitus with hyperglycemia: Secondary | ICD-10-CM | POA: Diagnosis present

## 2014-03-03 DIAGNOSIS — D62 Acute posthemorrhagic anemia: Secondary | ICD-10-CM | POA: Diagnosis not present

## 2014-03-03 DIAGNOSIS — I129 Hypertensive chronic kidney disease with stage 1 through stage 4 chronic kidney disease, or unspecified chronic kidney disease: Secondary | ICD-10-CM | POA: Diagnosis present

## 2014-03-03 DIAGNOSIS — Z9109 Other allergy status, other than to drugs and biological substances: Secondary | ICD-10-CM

## 2014-03-03 DIAGNOSIS — K219 Gastro-esophageal reflux disease without esophagitis: Secondary | ICD-10-CM

## 2014-03-03 DIAGNOSIS — I2510 Atherosclerotic heart disease of native coronary artery without angina pectoris: Secondary | ICD-10-CM | POA: Diagnosis present

## 2014-03-03 DIAGNOSIS — J45909 Unspecified asthma, uncomplicated: Secondary | ICD-10-CM

## 2014-03-03 DIAGNOSIS — I739 Peripheral vascular disease, unspecified: Secondary | ICD-10-CM

## 2014-03-03 DIAGNOSIS — K2270 Barrett's esophagus without dysplasia: Secondary | ICD-10-CM | POA: Diagnosis present

## 2014-03-03 DIAGNOSIS — N058 Unspecified nephritic syndrome with other morphologic changes: Secondary | ICD-10-CM | POA: Diagnosis present

## 2014-03-03 DIAGNOSIS — E876 Hypokalemia: Secondary | ICD-10-CM | POA: Diagnosis not present

## 2014-03-03 DIAGNOSIS — I5033 Acute on chronic diastolic (congestive) heart failure: Principal | ICD-10-CM

## 2014-03-03 DIAGNOSIS — M129 Arthropathy, unspecified: Secondary | ICD-10-CM | POA: Diagnosis present

## 2014-03-03 DIAGNOSIS — K589 Irritable bowel syndrome without diarrhea: Secondary | ICD-10-CM | POA: Diagnosis present

## 2014-03-03 DIAGNOSIS — J9600 Acute respiratory failure, unspecified whether with hypoxia or hypercapnia: Secondary | ICD-10-CM | POA: Diagnosis present

## 2014-03-03 DIAGNOSIS — F411 Generalized anxiety disorder: Secondary | ICD-10-CM | POA: Diagnosis not present

## 2014-03-03 DIAGNOSIS — I509 Heart failure, unspecified: Secondary | ICD-10-CM

## 2014-03-03 DIAGNOSIS — I209 Angina pectoris, unspecified: Secondary | ICD-10-CM

## 2014-03-03 DIAGNOSIS — E1129 Type 2 diabetes mellitus with other diabetic kidney complication: Secondary | ICD-10-CM | POA: Diagnosis present

## 2014-03-03 DIAGNOSIS — F329 Major depressive disorder, single episode, unspecified: Secondary | ICD-10-CM | POA: Diagnosis present

## 2014-03-03 DIAGNOSIS — Z7902 Long term (current) use of antithrombotics/antiplatelets: Secondary | ICD-10-CM

## 2014-03-03 DIAGNOSIS — E119 Type 2 diabetes mellitus without complications: Secondary | ICD-10-CM

## 2014-03-03 DIAGNOSIS — I10 Essential (primary) hypertension: Secondary | ICD-10-CM

## 2014-03-03 DIAGNOSIS — Z9861 Coronary angioplasty status: Secondary | ICD-10-CM

## 2014-03-03 DIAGNOSIS — N184 Chronic kidney disease, stage 4 (severe): Secondary | ICD-10-CM | POA: Diagnosis present

## 2014-03-03 DIAGNOSIS — E785 Hyperlipidemia, unspecified: Secondary | ICD-10-CM | POA: Diagnosis present

## 2014-03-03 DIAGNOSIS — G4730 Sleep apnea, unspecified: Secondary | ICD-10-CM | POA: Insufficient documentation

## 2014-03-03 DIAGNOSIS — Z8249 Family history of ischemic heart disease and other diseases of the circulatory system: Secondary | ICD-10-CM

## 2014-03-03 DIAGNOSIS — M109 Gout, unspecified: Secondary | ICD-10-CM | POA: Diagnosis present

## 2014-03-03 DIAGNOSIS — Z881 Allergy status to other antibiotic agents status: Secondary | ICD-10-CM

## 2014-03-03 DIAGNOSIS — R0602 Shortness of breath: Secondary | ICD-10-CM

## 2014-03-03 DIAGNOSIS — N179 Acute kidney failure, unspecified: Secondary | ICD-10-CM | POA: Diagnosis present

## 2014-03-03 DIAGNOSIS — Z794 Long term (current) use of insulin: Secondary | ICD-10-CM

## 2014-03-03 DIAGNOSIS — I252 Old myocardial infarction: Secondary | ICD-10-CM

## 2014-03-03 DIAGNOSIS — E873 Alkalosis: Secondary | ICD-10-CM | POA: Diagnosis not present

## 2014-03-03 DIAGNOSIS — J810 Acute pulmonary edema: Secondary | ICD-10-CM | POA: Diagnosis present

## 2014-03-03 DIAGNOSIS — J9601 Acute respiratory failure with hypoxia: Secondary | ICD-10-CM

## 2014-03-03 DIAGNOSIS — R29898 Other symptoms and signs involving the musculoskeletal system: Secondary | ICD-10-CM

## 2014-03-03 DIAGNOSIS — Z880 Allergy status to penicillin: Secondary | ICD-10-CM

## 2014-03-03 DIAGNOSIS — Z96659 Presence of unspecified artificial knee joint: Secondary | ICD-10-CM

## 2014-03-03 DIAGNOSIS — D689 Coagulation defect, unspecified: Secondary | ICD-10-CM | POA: Diagnosis present

## 2014-03-03 DIAGNOSIS — I5031 Acute diastolic (congestive) heart failure: Secondary | ICD-10-CM

## 2014-03-03 DIAGNOSIS — I4892 Unspecified atrial flutter: Secondary | ICD-10-CM | POA: Diagnosis present

## 2014-03-03 DIAGNOSIS — F3289 Other specified depressive episodes: Secondary | ICD-10-CM | POA: Diagnosis present

## 2014-03-03 DIAGNOSIS — Z7901 Long term (current) use of anticoagulants: Secondary | ICD-10-CM

## 2014-03-03 DIAGNOSIS — J690 Pneumonitis due to inhalation of food and vomit: Secondary | ICD-10-CM | POA: Diagnosis present

## 2014-03-03 DIAGNOSIS — G4733 Obstructive sleep apnea (adult) (pediatric): Secondary | ICD-10-CM | POA: Diagnosis present

## 2014-03-03 DIAGNOSIS — R079 Chest pain, unspecified: Secondary | ICD-10-CM | POA: Diagnosis present

## 2014-03-03 DIAGNOSIS — I4891 Unspecified atrial fibrillation: Secondary | ICD-10-CM

## 2014-03-03 DIAGNOSIS — E089 Diabetes mellitus due to underlying condition without complications: Secondary | ICD-10-CM

## 2014-03-03 DIAGNOSIS — K224 Dyskinesia of esophagus: Secondary | ICD-10-CM | POA: Diagnosis present

## 2014-03-03 DIAGNOSIS — N185 Chronic kidney disease, stage 5: Secondary | ICD-10-CM | POA: Diagnosis present

## 2014-03-03 DIAGNOSIS — Z885 Allergy status to narcotic agent status: Secondary | ICD-10-CM

## 2014-03-03 DIAGNOSIS — IMO0002 Reserved for concepts with insufficient information to code with codable children: Secondary | ICD-10-CM

## 2014-03-03 DIAGNOSIS — Z6839 Body mass index (BMI) 39.0-39.9, adult: Secondary | ICD-10-CM

## 2014-03-03 HISTORY — PX: CARDIOVERSION: SHX1299

## 2014-03-03 LAB — BASIC METABOLIC PANEL
BUN: 18 mg/dL (ref 6–23)
CO2: 21 mEq/L (ref 19–32)
Calcium: 9.4 mg/dL (ref 8.4–10.5)
Chloride: 104 mEq/L (ref 96–112)
Creatinine, Ser: 2.14 mg/dL — ABNORMAL HIGH (ref 0.50–1.10)
GFR calc Af Amer: 26 mL/min — ABNORMAL LOW (ref >90)
GFR calc non Af Amer: 23 mL/min — ABNORMAL LOW (ref >90)
Glucose, Bld: 165 mg/dL — ABNORMAL HIGH (ref 70–99)
Potassium: 4 mEq/L (ref 3.7–5.3)
Sodium: 142 mEq/L (ref 137–147)

## 2014-03-03 LAB — GLUCOSE, CAPILLARY
Glucose-Capillary: 74 mg/dL (ref 70–99)
Glucose-Capillary: 105 mg/dL — ABNORMAL HIGH (ref 70–99)
Glucose-Capillary: 178 mg/dL — ABNORMAL HIGH (ref 70–99)

## 2014-03-03 LAB — POCT I-STAT 4, (NA,K, GLUC, HGB,HCT)
Glucose, Bld: 81 mg/dL (ref 70–99)
HCT: 37 % (ref 36.0–46.0)
Hemoglobin: 12.6 g/dL (ref 12.0–15.0)
Potassium: 3.8 mEq/L (ref 3.7–5.3)
Sodium: 143 mEq/L (ref 137–147)

## 2014-03-03 LAB — CBC
HCT: 40.2 % (ref 36.0–46.0)
Hemoglobin: 13 g/dL (ref 12.0–15.0)
MCH: 30.3 pg (ref 26.0–34.0)
MCHC: 32.3 g/dL (ref 30.0–36.0)
MCV: 93.7 fL (ref 78.0–100.0)
Platelets: 234 10*3/uL (ref 150–400)
RBC: 4.29 MIL/uL (ref 3.87–5.11)
RDW: 15.2 % (ref 11.5–15.5)
WBC: 9.8 10*3/uL (ref 4.0–10.5)

## 2014-03-03 LAB — PROCALCITONIN: Procalcitonin: <0.1 ng/mL

## 2014-03-03 LAB — TROPONIN I: Troponin I: <0.3 ng/mL (ref <0.30)

## 2014-03-03 LAB — MRSA PCR SCREENING: MRSA by PCR: NEGATIVE

## 2014-03-03 LAB — PRO B NATRIURETIC PEPTIDE: Pro B Natriuretic peptide (BNP): 1531 pg/mL — ABNORMAL HIGH (ref 0–125)

## 2014-03-03 SURGERY — CARDIOVERSION
Anesthesia: Monitor Anesthesia Care

## 2014-03-03 MED ORDER — ONDANSETRON HCL 4 MG PO TABS: 4.0000 mg | ORAL_TABLET | Freq: Four times a day (QID) | ORAL | Status: DC | PRN

## 2014-03-03 MED ORDER — LIDOCAINE HCL (CARDIAC) 20 MG/ML IV SOLN
INTRAVENOUS | Status: AC
  Filled 2014-03-03: qty 5

## 2014-03-03 MED ORDER — ATORVASTATIN CALCIUM 20 MG PO TABS
20.0000 mg | ORAL_TABLET | Freq: Every day | ORAL | Status: DC
  Administered 2014-03-04 – 2014-03-12 (×9): 20 mg via ORAL
  Filled 2014-03-03 (×10): qty 1

## 2014-03-03 MED ORDER — BIOTENE DRY MOUTH MT LIQD
15.0000 mL | Freq: Two times a day (BID) | OROMUCOSAL | Status: DC
  Administered 2014-03-04 – 2014-03-13 (×16): 15 mL via OROMUCOSAL

## 2014-03-03 MED ORDER — SODIUM CHLORIDE 0.9 % IV SOLN: 250.0000 mL | INTRAVENOUS | Status: DC

## 2014-03-03 MED ORDER — PANTOPRAZOLE SODIUM 40 MG PO TBEC
40.0000 mg | DELAYED_RELEASE_TABLET | Freq: Every day | ORAL | Status: DC
  Administered 2014-03-04 – 2014-03-05 (×2): 40 mg via ORAL
  Filled 2014-03-03 (×2): qty 1

## 2014-03-03 MED ORDER — NITROGLYCERIN 0.4 MG/HR TD PT24
0.4000 mg | MEDICATED_PATCH | Freq: Every day | TRANSDERMAL | Status: DC
  Administered 2014-03-03: 0.4 mg via TRANSDERMAL
  Filled 2014-03-03 (×3): qty 1

## 2014-03-03 MED ORDER — ACETAMINOPHEN 325 MG PO TABS
650.0000 mg | ORAL_TABLET | Freq: Four times a day (QID) | ORAL | Status: DC | PRN
  Administered 2014-03-03 – 2014-03-05 (×3): 650 mg via ORAL
  Filled 2014-03-03 (×4): qty 2

## 2014-03-03 MED ORDER — MORPHINE SULFATE 2 MG/ML IJ SOLN
1.0000 mg | INTRAMUSCULAR | Status: DC | PRN
  Administered 2014-03-04 – 2014-03-06 (×6): 1 mg via INTRAVENOUS
  Filled 2014-03-03 (×6): qty 1

## 2014-03-03 MED ORDER — INSULIN GLARGINE 100 UNIT/ML ~~LOC~~ SOLN
40.0000 [IU] | Freq: Every day | SUBCUTANEOUS | Status: DC
  Administered 2014-03-04: 40 [IU] via SUBCUTANEOUS
  Filled 2014-03-03 (×2): qty 0.4

## 2014-03-03 MED ORDER — FUROSEMIDE 10 MG/ML IJ SOLN
40.0000 mg | Freq: Once | INTRAMUSCULAR | Status: AC
  Administered 2014-03-03: 40 mg via INTRAVENOUS
  Filled 2014-03-03: qty 4

## 2014-03-03 MED ORDER — INSULIN ASPART 100 UNIT/ML ~~LOC~~ SOLN
20.0000 [IU] | Freq: Three times a day (TID) | SUBCUTANEOUS | Status: DC
  Administered 2014-03-04 – 2014-03-13 (×15): 20 [IU] via SUBCUTANEOUS

## 2014-03-03 MED ORDER — SODIUM CHLORIDE 0.9 % IV SOLN
INTRAVENOUS | Status: DC | PRN
  Administered 2014-03-03: 09:00:00 via INTRAVENOUS

## 2014-03-03 MED ORDER — PROPOFOL 10 MG/ML IV BOLUS
INTRAVENOUS | Status: DC | PRN
  Administered 2014-03-03: 70 mg via INTRAVENOUS

## 2014-03-03 MED ORDER — LIDOCAINE HCL (CARDIAC) 20 MG/ML IV SOLN
INTRAVENOUS | Status: DC | PRN
  Administered 2014-03-03: 60 mg via INTRAVENOUS

## 2014-03-03 MED ORDER — INSULIN LISPRO 100 UNIT/ML ~~LOC~~ SOLN: 20.0000 [IU] | Freq: Three times a day (TID) | SUBCUTANEOUS | Status: DC

## 2014-03-03 MED ORDER — CLONIDINE HCL 0.3 MG PO TABS
0.3000 mg | ORAL_TABLET | Freq: Two times a day (BID) | ORAL | Status: DC
  Administered 2014-03-04: 0.3 mg via ORAL
  Filled 2014-03-03 (×4): qty 1

## 2014-03-03 MED ORDER — PROPOFOL 10 MG/ML IV BOLUS
INTRAVENOUS | Status: AC
  Filled 2014-03-03: qty 20

## 2014-03-03 MED ORDER — AMLODIPINE BESYLATE 10 MG PO TABS
10.0000 mg | ORAL_TABLET | Freq: Every day | ORAL | Status: DC
  Administered 2014-03-04: 10 mg via ORAL
  Filled 2014-03-03: qty 1

## 2014-03-03 MED ORDER — OXYCODONE-ACETAMINOPHEN 5-325 MG PO TABS
1.0000 | ORAL_TABLET | Freq: Four times a day (QID) | ORAL | Status: DC | PRN
  Administered 2014-03-04: 1 via ORAL
  Administered 2014-03-04 – 2014-03-07 (×3): 2 via ORAL
  Filled 2014-03-03 (×3): qty 2
  Filled 2014-03-03: qty 1

## 2014-03-03 MED ORDER — LEVOFLOXACIN IN D5W 750 MG/150ML IV SOLN
750.0000 mg | Freq: Once | INTRAVENOUS | Status: DC
  Administered 2014-03-03: 750 mg via INTRAVENOUS
  Filled 2014-03-03: qty 150

## 2014-03-03 MED ORDER — DEXTROSE-NACL 5-0.45 % IV SOLN
INTRAVENOUS | Status: DC
  Administered 2014-03-03: 500 mL via INTRAVENOUS

## 2014-03-03 MED ORDER — ONDANSETRON HCL 4 MG/2ML IJ SOLN: 4.0000 mg | Freq: Four times a day (QID) | INTRAMUSCULAR | Status: DC | PRN

## 2014-03-03 MED ORDER — METHOCARBAMOL 750 MG PO TABS
750.0000 mg | ORAL_TABLET | Freq: Three times a day (TID) | ORAL | Status: DC | PRN
  Filled 2014-03-03: qty 1

## 2014-03-03 MED ORDER — FEBUXOSTAT 40 MG PO TABS
40.0000 mg | ORAL_TABLET | Freq: Every day | ORAL | Status: DC
  Administered 2014-03-04 – 2014-03-13 (×10): 40 mg via ORAL
  Filled 2014-03-03 (×10): qty 1

## 2014-03-03 MED ORDER — CLOPIDOGREL BISULFATE 75 MG PO TABS
75.0000 mg | ORAL_TABLET | Freq: Every day | ORAL | Status: DC
  Filled 2014-03-03: qty 1

## 2014-03-03 MED ORDER — SODIUM CHLORIDE 0.9 % IJ SOLN: 3.0000 mL | INTRAMUSCULAR | Status: DC | PRN

## 2014-03-03 MED ORDER — POTASSIUM CHLORIDE CRYS ER 20 MEQ PO TBCR
20.0000 meq | EXTENDED_RELEASE_TABLET | Freq: Two times a day (BID) | ORAL | Status: DC
  Administered 2014-03-04 – 2014-03-05 (×5): 20 meq via ORAL
  Filled 2014-03-03 (×8): qty 1

## 2014-03-03 MED ORDER — INSULIN ASPART 100 UNIT/ML ~~LOC~~ SOLN
0.0000 [IU] | Freq: Three times a day (TID) | SUBCUTANEOUS | Status: DC
  Administered 2014-03-04: 2 [IU] via SUBCUTANEOUS
  Administered 2014-03-04: 1 [IU] via SUBCUTANEOUS
  Administered 2014-03-05: 3 [IU] via SUBCUTANEOUS
  Administered 2014-03-05: 5 [IU] via SUBCUTANEOUS
  Administered 2014-03-05: 3 [IU] via SUBCUTANEOUS
  Administered 2014-03-06: 7 [IU] via SUBCUTANEOUS
  Administered 2014-03-06: 5 [IU] via SUBCUTANEOUS
  Administered 2014-03-06 – 2014-03-07 (×3): 3 [IU] via SUBCUTANEOUS
  Administered 2014-03-08 – 2014-03-09 (×3): 2 [IU] via SUBCUTANEOUS
  Administered 2014-03-10: 5 [IU] via SUBCUTANEOUS
  Administered 2014-03-10 – 2014-03-11 (×2): 3 [IU] via SUBCUTANEOUS
  Administered 2014-03-11: 1 [IU] via SUBCUTANEOUS
  Administered 2014-03-11: 2 [IU] via SUBCUTANEOUS
  Administered 2014-03-12 (×2): 3 [IU] via SUBCUTANEOUS
  Administered 2014-03-13: 13:00:00 via SUBCUTANEOUS
  Administered 2014-03-13: 1 [IU] via SUBCUTANEOUS

## 2014-03-03 MED ORDER — FUROSEMIDE 10 MG/ML IJ SOLN
80.0000 mg | Freq: Four times a day (QID) | INTRAMUSCULAR | Status: DC
  Administered 2014-03-04 (×4): 80 mg via INTRAVENOUS
  Filled 2014-03-03 (×6): qty 8

## 2014-03-03 MED ORDER — SODIUM CHLORIDE 0.9 % IJ SOLN
3.0000 mL | Freq: Two times a day (BID) | INTRAMUSCULAR | Status: DC
  Administered 2014-03-03 – 2014-03-13 (×12): 3 mL via INTRAVENOUS

## 2014-03-03 MED ORDER — DICYCLOMINE HCL 10 MG PO CAPS
10.0000 mg | ORAL_CAPSULE | Freq: Four times a day (QID) | ORAL | Status: DC | PRN
  Filled 2014-03-03: qty 1

## 2014-03-03 MED ORDER — ACETAMINOPHEN 650 MG RE SUPP: 650.0000 mg | Freq: Four times a day (QID) | RECTAL | Status: DC | PRN

## 2014-03-03 MED ORDER — AMITRIPTYLINE HCL 25 MG PO TABS
25.0000 mg | ORAL_TABLET | Freq: Every day | ORAL | Status: DC
  Administered 2014-03-04 – 2014-03-12 (×10): 25 mg via ORAL
  Filled 2014-03-03 (×12): qty 1

## 2014-03-03 MED ORDER — METOPROLOL TARTRATE 50 MG PO TABS
50.0000 mg | ORAL_TABLET | Freq: Two times a day (BID) | ORAL | Status: DC
  Administered 2014-03-04 – 2014-03-13 (×20): 50 mg via ORAL
  Filled 2014-03-03 (×16): qty 1
  Filled 2014-03-03: qty 2
  Filled 2014-03-03 (×6): qty 1

## 2014-03-03 MED ORDER — FAMOTIDINE 20 MG PO TABS
20.0000 mg | ORAL_TABLET | Freq: Every day | ORAL | Status: DC
  Administered 2014-03-04 – 2014-03-05 (×2): 20 mg via ORAL
  Filled 2014-03-03 (×3): qty 1

## 2014-03-03 MED ORDER — LEVOFLOXACIN IN D5W 500 MG/100ML IV SOLN: 500.0000 mg | INTRAVENOUS | Status: DC

## 2014-03-03 MED ORDER — ALBUTEROL SULFATE (2.5 MG/3ML) 0.083% IN NEBU
2.5000 mg | INHALATION_SOLUTION | RESPIRATORY_TRACT | Status: DC | PRN
  Administered 2014-03-04: 2.5 mg via RESPIRATORY_TRACT
  Filled 2014-03-03: qty 3

## 2014-03-03 MED ORDER — ASPIRIN EC 81 MG PO TBEC
81.0000 mg | DELAYED_RELEASE_TABLET | Freq: Every day | ORAL | Status: DC
  Filled 2014-03-03: qty 1

## 2014-03-03 MED ORDER — IPRATROPIUM-ALBUTEROL 0.5-2.5 (3) MG/3ML IN SOLN
3.0000 mL | Freq: Once | RESPIRATORY_TRACT | Status: AC
  Administered 2014-03-03: 3 mL via RESPIRATORY_TRACT
  Filled 2014-03-03: qty 3

## 2014-03-03 MED ORDER — LINACLOTIDE 290 MCG PO CAPS
290.0000 ug | ORAL_CAPSULE | Freq: Every day | ORAL | Status: DC
  Administered 2014-03-04 – 2014-03-13 (×8): 290 ug via ORAL
  Filled 2014-03-03 (×10): qty 1

## 2014-03-03 MED ORDER — ZOLPIDEM TARTRATE 5 MG PO TABS
5.0000 mg | ORAL_TABLET | Freq: Every evening | ORAL | Status: DC | PRN
  Administered 2014-03-04 – 2014-03-12 (×8): 5 mg via ORAL
  Filled 2014-03-03 (×8): qty 1

## 2014-03-03 MED ORDER — APIXABAN 5 MG PO TABS
5.0000 mg | ORAL_TABLET | Freq: Two times a day (BID) | ORAL | Status: DC
  Administered 2014-03-04 – 2014-03-05 (×4): 5 mg via ORAL
  Filled 2014-03-03 (×8): qty 1

## 2014-03-03 MED ORDER — SODIUM CHLORIDE 0.9 % IJ SOLN: 3.0000 mL | Freq: Two times a day (BID) | INTRAMUSCULAR | Status: DC

## 2014-03-03 NOTE — Progress Notes (Signed)
Pt. Was placed on CPAP auto titrate (min: 5, max: 10) via [facility] (what pt. Wears at home) with 4L O2 bled in. Pt. Is tolerating CPAP well at this time without any complications.

## 2014-03-03 NOTE — H&P (Signed)
[facility] History and Physical  LOUAN MCMAIN [id number] DOB: 1946-06-15 DOA: 03/03/2014  Referring physician: ER physician. PCP: [name], MD   Chief Complaint: Shortness of breath and chest pain.  HPI: [name] is a 68 y.o. female with history of recently diagnosed atrial fibrillation and placed on apixaban and had cardioversion this morning started experiencing shortness of breath since evening today. Patient was short of breath either at rest with chest pressure and cough. Since symptoms were persistent patient was brought to the ER. Chest x-ray shows congestion. Patient was given Lasix 80 mg IV and admitted for further management. While in the ER patient was mildly febrile for which antibiotics has been empirically started on procalcitonin levels has been sent. Patient's chest pain is off and on last a few minutes pressure-like. Patient otherwise denies any nausea vomiting abdominal pain diarrhea fever chills. Denies any headache visual symptoms or focal deficits. Patient's cardiologist Dr. Einar Gip was notified about the admission.   Review of Systems: As presented in the history of presenting illness, rest negative.  Past Medical History  Diagnosis Date  . Atrial flutter   . Hypertension   . Diabetes mellitus   . Diastolic heart failure   . Asthma   . Hyperlipidemia   . Fatty liver   . Esophageal dysmotility   . Arthritis   . Congestive heart failure   . Sleep apnea     wears CPAP  . Family history of malignant neoplasm of gastrointestinal tract   . Fatty tumor fatty tumor back  . Coronary atherosclerosis of native coronary artery   . Morbid obesity   . Myocardial infarction 2009  . Dysrhythmia     afib,flutter hx  . Heart murmur   . Peripheral vascular disease   . Shortness of breath   . Pneumonia     hx  . GERD (gastroesophageal reflux disease)     barrets esophagus  . Anginal pain     occ; non-ischemic Lexiscan 09/2012  . Kidney disease    CKD stage IV (Dr. Erling Cruz)   Past Surgical History  Procedure Laterality Date  . Coronary angioplasty with stent placement    . Breast lumpectomy      right  . Tubal ligation    . Tonsillectomy    . Total knee arthroplasty Right 12/15/2013    Procedure: RIGHT TOTAL KNEE ARTHROPLASTY;  Surgeon: Alta Corning, MD;  Location: [place];  Service: Orthopedics;  Laterality: Right;   Social History:  reports that she has never smoked. She has never used smokeless tobacco. She reports that she does not drink alcohol or use illicit drugs. Where does patient live home. Can patient participate in ADLs? Yes.  Allergies  Allergen Reactions  . Codeine Nausea And Vomiting  . Penicillins Nausea And Vomiting  . Sulfa Antibiotics Itching and Nausea And Vomiting    "everything I seen was red"  . Other Itching    Adhesive from ekg leads    Family History:  Family History  Problem Relation Age of Onset  . Heart disease Mother   . Cancer Mother     bladder  . Kidney disease Mother   . Ovarian cancer Daughter   . Stomach cancer Maternal Uncle   . Colon cancer Maternal Aunt   . Esophageal cancer Neg Hx       Prior to Admission medications   Medication Sig Start Date End Date Taking? Authorizing Dionel Archey  acetaminophen (TYLENOL) 500 MG tablet Take 500 mg  by mouth every 6 (six) hours as needed for mild pain.   Yes Historical Biviana Saddler, MD  albuterol (PROVENTIL HFA;VENTOLIN HFA) 108 (90 BASE) MCG/ACT inhaler Inhale 2 puffs into the lungs every 4 (four) hours as needed for wheezing.    Yes Historical Thailan Sava, MD  amitriptyline (ELAVIL) 25 MG tablet Take 25 mg by mouth at bedtime.     Yes Historical Jamont Mellin, MD  amLODipine (NORVASC) 10 MG tablet Take 10 mg by mouth daily.   Yes Roxy Cedar, CMA  apixaban (ELIQUIS) 5 MG TABS tablet Take 5 mg by mouth 2 (two) times daily.   Yes Historical October Peery, MD  cloNIDine (CATAPRES) 0.3 MG tablet Take 0.3 mg by mouth 2 (two) times daily.     Yes Historical  Charmian Forbis, MD  clopidogrel (PLAVIX) 75 MG tablet Take 75 mg by mouth daily.    Yes Historical Kourtnei Rauber, MD  dicyclomine (BENTYL) 10 MG capsule Take 10 mg by mouth 4 (four) times daily as needed (IBS).   Yes Historical Tobey Schmelzle, MD  esomeprazole (NEXIUM) 40 MG capsule Take 40 mg by mouth daily at 12 noon.   Yes Historical Gena Laski, MD  febuxostat (ULORIC) 40 MG tablet Take 40 mg by mouth daily.    Yes Historical Juliene Kirsh, MD  insulin glargine (LANTUS) 100 UNIT/ML injection Inject 0.4 mLs (40 Units total) into the skin daily. 12/23/13  Yes Delfina Redwood, MD  insulin lispro (HUMALOG) 100 UNIT/ML injection Inject 0.2 mLs (20 Units total) into the skin 3 (three) times daily after meals. 12/23/13  Yes Delfina Redwood, MD  Linaclotide Rolan Lipa) 145 MCG CAPS capsule Take 290 mcg by mouth daily.   Yes Historical Aimi Essner, MD  methocarbamol (ROBAXIN-750) 750 MG tablet Take 1 tablet (750 mg total) by mouth every 8 (eight) hours as needed for muscle spasms. 12/23/13  Yes Erlene Senters, PA-C  metoprolol (LOPRESSOR) 50 MG tablet Take 50 mg by mouth 2 (two) times daily.   Yes Historical Lyndsie Wallman, MD  nitroGLYCERIN (NITROSTAT) 0.4 MG SL tablet Place 0.4 mg under the tongue every 5 (five) minutes as needed for chest pain.    Yes Historical Vaibhav Fogleman, MD  oxyCODONE-acetaminophen (PERCOCET/ROXICET) 5-325 MG per tablet Take 1-2 tablets by mouth every 6 (six) hours as needed for severe pain. 12/23/13  Yes Delfina Redwood, MD  potassium chloride SA (K-DUR,KLOR-CON) 20 MEQ tablet Take 20 mEq by mouth 2 (two) times daily.    Yes Historical Carollynn Pennywell, MD  ranitidine (ZANTAC) 150 MG tablet Take 1 tablet (150 mg total) by mouth at bedtime. 07/29/13  Yes Willia Craze, NP  rosuvastatin (CRESTOR) 10 MG tablet Take 10 mg by mouth daily.     Yes Historical Mahesh Sizemore, MD  torsemide (DEMADEX) 20 MG tablet Take 40 mg by mouth 2 (two) times daily.   Yes Historical Brealynn Contino, MD  Vitamin D, Ergocalciferol, (DRISDOL) 50000 UNITS  CAPS capsule Take 50,000 Units by mouth every Monday.   Yes Historical Amily Depp, MD  zolpidem (AMBIEN) 10 MG tablet Take 10 mg by mouth at bedtime as needed for sleep.    Yes Historical Duwan Adrian, MD    Physical Exam: Filed Vitals:   03/03/14 1845 03/03/14 1900 03/03/14 1915 03/03/14 2000  BP:    150/76  Pulse: 91 93 86 89  Resp: 18 25 20 24   Height:      Weight:      SpO2: 99% 100% 92% 94%     General:  Well-developed and nourished.  Eyes: Anicteric no pallor.  ENT:  No discharge from the ears eyes nose mouth.  Neck: No mass felt. Elevated JVD.  Cardiovascular: S1-S2 heard.  Respiratory: No rhonchi there is minimal crepitations.  Abdomen: Soft nontender bowel sounds present. No guarding or rigidity.  Skin: No rash.  Musculoskeletal: Minimal edema lower extremities.  Psychiatric: Appears normal.  Neurologic: Alert awake oriented to time place and person. Moves all extremities.  Labs on Admission:  Basic Metabolic Panel:  Recent Labs Lab 03/03/14 0908 03/03/14 1851  NA 143 142  K 3.8 4.0  CL  --  104  CO2  --  21  GLUCOSE 81 165*  BUN  --  18  CREATININE  --  2.14*  CALCIUM  --  9.4   Liver Function Tests: No results found for this basename: AST, ALT, ALKPHOS, BILITOT, PROT, ALBUMIN,  in the last 168 hours No results found for this basename: LIPASE, AMYLASE,  in the last 168 hours No results found for this basename: AMMONIA,  in the last 168 hours CBC:  Recent Labs Lab 03/03/14 0908 03/03/14 1851  WBC  --  9.8  HGB 12.6 13.0  HCT 37.0 40.2  MCV  --  93.7  PLT  --  234   Cardiac Enzymes: No results found for this basename: CKTOTAL, CKMB, CKMBINDEX, TROPONINI,  in the last 168 hours  BNP (last 3 results) No results found for this basename: PROBNP,  in the last 8760 hours CBG:  Recent Labs Lab 03/03/14 0929 03/03/14 1034  GLUCAP 74 105*    Radiological Exams on Admission: Dg Chest Portable 1 View  03/03/2014   CLINICAL DATA:  Shortness  of breath with chest pain for 1 day.  EXAM: PORTABLE CHEST - 1 VIEW  COMPARISON:  01/15/2014 and 01/06/2014.  FINDINGS: 1832 hr. There are lower lung volumes. The heart size and mediastinal contours are stable. There are increased interstitial markings throughout the lungs associated with poor definition of the pulmonary vasculature, most consistent with pulmonary edema. There is no confluent airspace opacity or significant pleural effusion. There is no evidence of pneumothorax or acute osseous abnormality. Telemetry leads overlie the chest.  IMPRESSION: New diffuse interstitial prominence most consistent with acute congestive heart failure. Atypical infection is considered less likely.   Electronically Signed   By: Camie Patience M.D.   On: 03/03/2014 18:48    EKG: Independently reviewed. Atrial premature complexes. Nonspecific ST changes.  Assessment/Plan Principal Problem:   Acute respiratory failure Active Problems:   Obstructive sleep apnea   Acute CHF   Chest pain   1. Acute respiratory failure most likely from decompensated CHF last EF was 50-55% - patient has been placed on Lasix 80 mg IV every 6 hourly. Patient has been placed on nitro paste and if chest pain persists then start IV nitroglycerin infusion. Since patient also has cough and mild fever for now I have placed patient on empiric antibiotics and check procalcitonin levels which if negative antibiotics can be discontinued. Since patient has chest pain we will cycle cardiac markers. I have discussed with Dr. Einar Gip patient's cardiologist. 2. Atrial fibrillation status post cardioversion today presently in sinus rhythm - continue apixaban rate limiting medications. 3. Chronic kidney disease stage IV - patient is followed by Dr. Florene Glen. May notify Dr. Florene Glen in a.m. given patient's CHF and chronic kidney disease. Closely follow intake output and metabolic panel. 4. CAD status post stenting - cycle cardiac markers. See #1. Continue home  medications. 5. OSA - CPAP per respiratory. 6. Diabetes mellitus -  closely follow CBGs with present home medications. 7. Hypertension - continue home medications. 8. Hyperlipidemia - continue statins.    Code Status: Full code.  Family Communication: Patient's family at the bedside.  Disposition Plan: Admit to inpatient.    KAKRAKANDY,ARSHAD N. [facility] Pager [phone].  If 7PM-7AM, please contact night-coverage [email or web address] Password [email or web address] 03/03/2014, 8:40 PM

## 2014-03-03 NOTE — Anesthesia Postprocedure Evaluation (Signed)
  Anesthesia Post-op Note  Patient: [name]  Procedure(s) Performed: Procedure(s): CARDIOVERSION (N/A)  Patient Location: Endoscopy Unit  Anesthesia Type:MAC  Level of Consciousness: awake, alert , oriented and patient cooperative  Airway and Oxygen Therapy: Patient Spontanous Breathing  Post-op Pain: none  Post-op Assessment: Post-op Vital signs reviewed, Patient's Cardiovascular Status Stable, Respiratory Function Stable, Patent Airway, No signs of Nausea or vomiting, Adequate PO intake, Pain level controlled, No headache and No backache  Post-op Vital Signs: Reviewed and stable  Last Vitals:  Filed Vitals:   03/03/14 1100  BP: 133/61  Pulse: 62  Temp:   Resp: 20    Complications: No apparent anesthesia complications

## 2014-03-03 NOTE — H&P (Signed)
  Please see office visit notes for complete details of HPI.  

## 2014-03-03 NOTE — CV Procedure (Signed)
Direct current cardioversion:  Indication symptomatic A. Fibrillation.  Procedure: Using 70 mg of IV Propofol and 60 IV Lidocaine (for reducing venous pain) for achieving deep sedation, synchronized direct current cardioversion performed. Patient was delivered with 120 Joules of electricity X 1 with success to NSR. Patient tolerated the procedure well. No immediate complication noted.   

## 2014-03-03 NOTE — Interval H&P Note (Signed)
History and Physical Interval Note:  03/03/2014 9:57 AM  [name]  has presented today for surgery, with the diagnosis of afib  The various methods of treatment have been discussed with the patient and family. After consideration of risks, benefits and other options for treatment, the patient has consented to  Procedure(s): CARDIOVERSION (N/A) as a surgical intervention .  The patient's history has been reviewed, patient examined, no change in status, stable for surgery.  I have reviewed the patient's chart and labs.  Questions were answered to the patient's satisfaction.   Patient consents to having students observing: Raoul Pitch.   Laverda Page

## 2014-03-03 NOTE — Transfer of Care (Signed)
Immediate Anesthesia Transfer of Care Note  Patient: [name]  Procedure(s) Performed: Procedure(s): CARDIOVERSION (N/A)  Patient Location: Endoscopy Unit  Anesthesia Type:MAC  Level of Consciousness: awake, alert , oriented and patient cooperative  Airway & Oxygen Therapy: Patient Spontanous Breathing and Patient connected to nasal cannula oxygen  Post-op Assessment: Report given to PACU RN and Post -op Vital signs reviewed and stable  Post vital signs: Reviewed and stable  Complications: No apparent anesthesia complications

## 2014-03-03 NOTE — Anesthesia Preprocedure Evaluation (Signed)
Anesthesia Evaluation  Patient identified by MRN, date of birth, ID band Patient awake    Reviewed: Allergy & Precautions, H&P , NPO status , Patient's Chart, lab work & pertinent test results  Airway Mallampati: II  Neck ROM: full    Dental   Pulmonary shortness of breath, asthma , sleep apnea ,          Cardiovascular hypertension, + angina + CAD, + Past MI, + Cardiac Stents, + Peripheral Vascular Disease and +CHF + dysrhythmias Atrial Fibrillation     Neuro/Psych  Neuromuscular disease    GI/Hepatic GERD-  ,  Endo/Other  diabetes, Type 2  Renal/GU Renal InsufficiencyRenal disease     Musculoskeletal   Abdominal   Peds  Hematology   Anesthesia Other Findings   Reproductive/Obstetrics                           Anesthesia Physical Anesthesia Plan  ASA: III  Anesthesia Plan: MAC   Post-op Pain Management:    Induction: Intravenous  Airway Management Planned: Mask  Additional Equipment:   Intra-op Plan:   Post-operative Plan:   Informed Consent: I have reviewed the patients History and Physical, chart, labs and discussed the procedure including the risks, benefits and alternatives for the proposed anesthesia with the patient or authorized representative who has indicated his/her understanding and acceptance.     Plan Discussed with: CRNA, Anesthesiologist and Surgeon  Anesthesia Plan Comments:         Anesthesia Quick Evaluation

## 2014-03-03 NOTE — Progress Notes (Signed)
CBG=74.  D5 1/2NS hung at [facility] rate.  Will repeat CBG after cardioversion complete.

## 2014-03-03 NOTE — Progress Notes (Addendum)
ANTIBIOTIC CONSULT NOTE - INITIAL  Pharmacy Consult for Levaquin Indication: CAP  Allergies  Allergen Reactions  . Codeine Nausea And Vomiting  . Penicillins Nausea And Vomiting  . Sulfa Antibiotics Itching and Nausea And Vomiting    "everything I seen was red"  . Other Itching    Adhesive from ekg leads    Patient Measurements: Height: 5\' 4"  (162.6 cm) Weight: 230 lb (104.327 kg) IBW/kg (Calculated) : 54.7  Vital Signs: Temp: 101.7 F (38.7 C) (06/30 2150) Temp src: Oral (06/30 2150) BP: 150/76 mmHg (06/30 2000) Pulse Rate: 89 (06/30 2000) Intake/Output from previous day:   Intake/Output from this shift:    Labs:  Recent Labs  03/03/14 0908 03/03/14 1851  WBC  --  9.8  HGB 12.6 13.0  PLT  --  234  CREATININE  --  2.14*   Estimated Creatinine Clearance: 30 ml/min (by C-G formula based on Cr of 2.14). No results found for this basename: VANCOTROUGH, VANCOPEAK, VANCORANDOM, GENTTROUGH, GENTPEAK, GENTRANDOM, TOBRATROUGH, TOBRAPEAK, TOBRARND, AMIKACINPEAK, AMIKACINTROU, AMIKACIN,  in the last 72 hours   Microbiology: No results found for this or any previous visit (from the past 720 hour(s)).  Medical History: Past Medical History  Diagnosis Date  . Atrial flutter   . Hypertension   . Diabetes mellitus   . Diastolic heart failure   . Asthma   . Hyperlipidemia   . Fatty liver   . Esophageal dysmotility   . Arthritis   . Congestive heart failure   . Sleep apnea     wears CPAP  . Family history of malignant neoplasm of gastrointestinal tract   . Fatty tumor fatty tumor back  . Coronary atherosclerosis of native coronary artery   . Morbid obesity   . Myocardial infarction 2009  . Dysrhythmia     afib,flutter hx  . Heart murmur   . Peripheral vascular disease   . Shortness of breath   . Pneumonia     hx  . GERD (gastroesophageal reflux disease)     barrets esophagus  . Anginal pain     occ; non-ischemic Lexiscan 09/2012  . Kidney disease     CKD  stage IV (Dr. Erling Cruz)    Medications:  Prescriptions prior to admission  Medication Sig Dispense Refill  . acetaminophen (TYLENOL) 500 MG tablet Take 500 mg by mouth every 6 (six) hours as needed for mild pain.      Marland Kitchen albuterol (PROVENTIL HFA;VENTOLIN HFA) 108 (90 BASE) MCG/ACT inhaler Inhale 2 puffs into the lungs every 4 (four) hours as needed for wheezing.       Marland Kitchen amitriptyline (ELAVIL) 25 MG tablet Take 25 mg by mouth at bedtime.        Marland Kitchen amLODipine (NORVASC) 10 MG tablet Take 10 mg by mouth daily.      Marland Kitchen apixaban (ELIQUIS) 5 MG TABS tablet Take 5 mg by mouth 2 (two) times daily.      . cloNIDine (CATAPRES) 0.3 MG tablet Take 0.3 mg by mouth 2 (two) times daily.        . clopidogrel (PLAVIX) 75 MG tablet Take 75 mg by mouth daily.       Marland Kitchen dicyclomine (BENTYL) 10 MG capsule Take 10 mg by mouth 4 (four) times daily as needed (IBS).      Marland Kitchen esomeprazole (NEXIUM) 40 MG capsule Take 40 mg by mouth daily at 12 noon.      . febuxostat (ULORIC) 40 MG tablet Take 40 mg by mouth  daily.       . insulin glargine (LANTUS) 100 UNIT/ML injection Inject 0.4 mLs (40 Units total) into the skin daily.  10 mL  11  . insulin lispro (HUMALOG) 100 UNIT/ML injection Inject 0.2 mLs (20 Units total) into the skin 3 (three) times daily after meals.  10 mL  11  . Linaclotide (LINZESS) 145 MCG CAPS capsule Take 290 mcg by mouth daily.      . methocarbamol (ROBAXIN-750) 750 MG tablet Take 1 tablet (750 mg total) by mouth every 8 (eight) hours as needed for muscle spasms.  40 tablet  0  . metoprolol (LOPRESSOR) 50 MG tablet Take 50 mg by mouth 2 (two) times daily.      . nitroGLYCERIN (NITROSTAT) 0.4 MG SL tablet Place 0.4 mg under the tongue every 5 (five) minutes as needed for chest pain.       Marland Kitchen oxyCODONE-acetaminophen (PERCOCET/ROXICET) 5-325 MG per tablet Take 1-2 tablets by mouth every 6 (six) hours as needed for severe pain.  30 tablet  0  . potassium chloride SA (K-DUR,KLOR-CON) 20 MEQ tablet Take 20 mEq by  mouth 2 (two) times daily.       . ranitidine (ZANTAC) 150 MG tablet Take 1 tablet (150 mg total) by mouth at bedtime.  30 tablet  11  . rosuvastatin (CRESTOR) 10 MG tablet Take 10 mg by mouth daily.        Marland Kitchen torsemide (DEMADEX) 20 MG tablet Take 40 mg by mouth 2 (two) times daily.      . Vitamin D, Ergocalciferol, (DRISDOL) 50000 UNITS CAPS capsule Take 50,000 Units by mouth every Monday.      . zolpidem (AMBIEN) 10 MG tablet Take 10 mg by mouth at bedtime as needed for sleep.        Assessment: 68 y/o female who had an outpatient cardioversion today. She did have SOB at her procedure but it has progressively worsened. Pharmacy consulted to begin Levaquin for CAP. CXR c/w acute CHF, atypical infection less likely. She has a hx of CKD with SCr currently 2.14 and est CrCl ~ 30 ml/min. She is febrile however WBC are normal.  Goal of Therapy:  Eradication of infection  Plan:  - Levaquin 750 mg IV now then 500 mg IV q48h - Monitor renal function and clinical progress  [facility], Pharm.D., BCPS Clinical Pharmacist Pager: [phone] 03/03/2014 9:53 PM   Addendum: Antibiotics changed to HCAP coverage Will give Vancomycin 2000 mg IV now, then 1 g IV q24h Cefepime 1 g IV q24h  Phillis Knack, PharmD, BCPS  03/04/2014 12:06 AM

## 2014-03-03 NOTE — ED Notes (Addendum)
Per EMS: scheduled cardioversion this am at outpatient clinic, was short of breath at that time but progressively worsened since then, has cough, lung sounds clear.  Sinus rhythm on the monitor with PACs occuring a couple of minutes or so initially but getting more frequent as the ride continued., 88% on room air, brought up to 94% on 4 L.  Chest pressure that resolved.  Per Patient: shortness of breath, coughing, shortness of breath.  Coughing started after procedure today.  Recent surgery to right knee in April.  Pressure to left side of sternum, "feels like elephant on my chest"

## 2014-03-03 NOTE — ED Provider Notes (Signed)
CSN: [id number]     Arrival date & time 03/03/14  1746 History   First MD Initiated Contact with Patient 03/03/14 1802     Chief Complaint  Patient presents with  . Shortness of Breath    cardioverted this am at outpatient clinic  . Chest Pain    pressure "like an elephant sitting on my chest"     (Consider location/radiation/quality/duration/timing/severity/associated sxs/prior Treatment) HPI Pt is a 68yo female presenting to ED from home via EMS, per EMS pt had a scheduled cardioversion this morning at outpatient clinic. Pt was SOB at that time but it has progressively worsened since then.  Pt has had productive cough with clear breath sounds.  Sinus rhythm on monitor with PACs occurring a couple minutes or so initially but getting more frequent en route to ED.  88% on RA, brought up to 94% on 4L [place].  Pt reported initial centralized chest pressure decreased as someone sitting on her chest, however, CP resolved just PTA to ED.   Per pt, pt c/o SOB, states it is hard to breath like she cannot catch her breath. States symptoms have worsened since cardioversion this morning. Pt states she is on a blood thinner, Eliquis, and was feeling well yesterday. Denies recent fever, cough or congestion. Reports hx of asthma but denies hx of COPD.   Cardiologist: Dr. Einar Gip    Past Medical History  Diagnosis Date  . Atrial flutter   . Hypertension   . Diabetes mellitus   . Diastolic heart failure   . Asthma   . Hyperlipidemia   . Fatty liver   . Esophageal dysmotility   . Arthritis   . Congestive heart failure   . Sleep apnea     wears CPAP  . Family history of malignant neoplasm of gastrointestinal tract   . Fatty tumor fatty tumor back  . Coronary atherosclerosis of native coronary artery   . Morbid obesity   . Myocardial infarction 2009  . Dysrhythmia     afib,flutter hx  . Heart murmur   . Peripheral vascular disease   . Shortness of breath   . Pneumonia     hx  . GERD  (gastroesophageal reflux disease)     barrets esophagus  . Anginal pain     occ; non-ischemic Lexiscan 09/2012  . Kidney disease     CKD stage IV (Dr. Erling Cruz)   Past Surgical History  Procedure Laterality Date  . Coronary angioplasty with stent placement    . Breast lumpectomy      right  . Tubal ligation    . Tonsillectomy    . Total knee arthroplasty Right 12/15/2013    Procedure: RIGHT TOTAL KNEE ARTHROPLASTY;  Surgeon: Alta Corning, MD;  Location: [facility];  Service: Orthopedics;  Laterality: Right;  . Cardioversion N/A 03/03/2014    Procedure: CARDIOVERSION;  Surgeon: Laverda Page, MD;  Location: [facility] ENDOSCOPY;  Service: Cardiovascular;  Laterality: N/A;   Family History  Problem Relation Age of Onset  . Heart disease Mother   . Cancer Mother     bladder  . Kidney disease Mother   . Ovarian cancer Daughter   . Stomach cancer Maternal Uncle   . Colon cancer Maternal Aunt   . Esophageal cancer Neg Hx    History  Substance Use Topics  . Smoking status: Never Smoker   . Smokeless tobacco: Never Used  . Alcohol Use: No   OB History   Grav Para Term Preterm  Abortions TAB SAB Ect Mult Living                 Review of Systems  Constitutional: Negative for fever and chills.  Respiratory: Positive for cough and shortness of breath.   Cardiovascular: Positive for leg swelling ( right knee (sugery in April) ). Negative for chest pain and palpitations.  Gastrointestinal: Negative for nausea, vomiting, abdominal pain and diarrhea.  All other systems reviewed and are negative.     Allergies  Codeine; Penicillins; Sulfa antibiotics; and Other  Home Medications   Prior to Admission medications   Medication Sig Start Date End Date Taking? Authorizing Provider  acetaminophen (TYLENOL) 500 MG tablet Take 500 mg by mouth every 6 (six) hours as needed for mild pain.   Yes Historical Provider, MD  albuterol (PROVENTIL HFA;VENTOLIN HFA) 108 (90 BASE) MCG/ACT inhaler  Inhale 2 puffs into the lungs every 4 (four) hours as needed for wheezing.    Yes Historical Provider, MD  amitriptyline (ELAVIL) 25 MG tablet Take 25 mg by mouth at bedtime.     Yes Historical Provider, MD  amLODipine (NORVASC) 10 MG tablet Take 10 mg by mouth daily.   Yes Roxy Cedar, CMA  apixaban (ELIQUIS) 5 MG TABS tablet Take 5 mg by mouth 2 (two) times daily.   Yes Historical Provider, MD  cloNIDine (CATAPRES) 0.3 MG tablet Take 0.3 mg by mouth 2 (two) times daily.     Yes Historical Provider, MD  clopidogrel (PLAVIX) 75 MG tablet Take 75 mg by mouth daily.    Yes Historical Provider, MD  dicyclomine (BENTYL) 10 MG capsule Take 10 mg by mouth 4 (four) times daily as needed (IBS).   Yes Historical Provider, MD  esomeprazole (NEXIUM) 40 MG capsule Take 40 mg by mouth daily at 12 noon.   Yes Historical Provider, MD  febuxostat (ULORIC) 40 MG tablet Take 40 mg by mouth daily.    Yes Historical Provider, MD  insulin glargine (LANTUS) 100 UNIT/ML injection Inject 0.4 mLs (40 Units total) into the skin daily. 12/23/13  Yes Delfina Redwood, MD  insulin lispro (HUMALOG) 100 UNIT/ML injection Inject 0.2 mLs (20 Units total) into the skin 3 (three) times daily after meals. 12/23/13  Yes Delfina Redwood, MD  Linaclotide Rolan Lipa) 145 MCG CAPS capsule Take 290 mcg by mouth daily.   Yes Historical Provider, MD  methocarbamol (ROBAXIN-750) 750 MG tablet Take 1 tablet (750 mg total) by mouth every 8 (eight) hours as needed for muscle spasms. 12/23/13  Yes Erlene Senters, PA-C  metoprolol (LOPRESSOR) 50 MG tablet Take 50 mg by mouth 2 (two) times daily.   Yes Historical Provider, MD  nitroGLYCERIN (NITROSTAT) 0.4 MG SL tablet Place 0.4 mg under the tongue every 5 (five) minutes as needed for chest pain.    Yes Historical Provider, MD  oxyCODONE-acetaminophen (PERCOCET/ROXICET) 5-325 MG per tablet Take 1-2 tablets by mouth every 6 (six) hours as needed for severe pain. 12/23/13  Yes Delfina Redwood, MD   potassium chloride SA (K-DUR,KLOR-CON) 20 MEQ tablet Take 20 mEq by mouth 2 (two) times daily.    Yes Historical Provider, MD  ranitidine (ZANTAC) 150 MG tablet Take 1 tablet (150 mg total) by mouth at bedtime. 07/29/13  Yes Willia Craze, NP  rosuvastatin (CRESTOR) 10 MG tablet Take 10 mg by mouth daily.     Yes Historical Provider, MD  torsemide (DEMADEX) 20 MG tablet Take 40 mg by mouth 2 (two) times daily.  Yes Historical Provider, MD  Vitamin D, Ergocalciferol, (DRISDOL) 50000 UNITS CAPS capsule Take 50,000 Units by mouth every Monday.   Yes Historical Provider, MD  zolpidem (AMBIEN) 10 MG tablet Take 10 mg by mouth at bedtime as needed for sleep.    Yes Historical Provider, MD   BP 146/109  Pulse 83  Temp(Src) 99.4 F (37.4 C) (Oral)  Resp 20  Ht 5\' 4"  (1.626 m)  Wt 191 lb 5.8 oz (86.8 kg)  BMI 32.83 kg/m2  SpO2 93% Physical Exam  Nursing note and vitals reviewed. Constitutional: She appears well-developed and well-nourished. No distress.  HENT:  Head: Normocephalic and atraumatic.  Eyes: Conjunctivae are normal. No scleral icterus.  Neck: Normal range of motion.  Cardiovascular: Normal rate, regular rhythm and normal heart sounds.   Pulmonary/Chest: Tachypnea noted. No respiratory distress. She has wheezes ( diffuse expiratory). She has no rales. She exhibits no tenderness.  Abdominal: Soft. Bowel sounds are normal. She exhibits no distension and no mass. There is no tenderness. There is no rebound and no guarding.  Musculoskeletal: Normal range of motion.  Neurological: She is alert.  Skin: Skin is warm and dry. She is not diaphoretic.    ED Course  Procedures (including critical care time) Labs Review Labs Reviewed  BASIC METABOLIC PANEL - Abnormal; Notable for the following:    Glucose, Bld 165 (*)    Creatinine, Ser 2.14 (*)    GFR calc non Af Amer 23 (*)    GFR calc Af Amer 26 (*)    All other components within normal limits  PRO B NATRIURETIC PEPTIDE -  Abnormal; Notable for the following:    Pro B Natriuretic peptide (BNP) 1531.0 (*)    All other components within normal limits  COMPREHENSIVE METABOLIC PANEL - Abnormal; Notable for the following:    Glucose, Bld 217 (*)    Creatinine, Ser 2.14 (*)    Albumin 2.9 (*)    GFR calc non Af Amer 23 (*)    GFR calc Af Amer 26 (*)    All other components within normal limits  GLUCOSE, CAPILLARY - Abnormal; Notable for the following:    Glucose-Capillary 178 (*)    All other components within normal limits  CBC WITH DIFFERENTIAL - Abnormal; Notable for the following:    Neutrophils Relative % 78 (*)    Neutro Abs 7.8 (*)    All other components within normal limits  GLUCOSE, CAPILLARY - Abnormal; Notable for the following:    Glucose-Capillary 160 (*)    All other components within normal limits  GLUCOSE, CAPILLARY - Abnormal; Notable for the following:    Glucose-Capillary 139 (*)    All other components within normal limits  MRSA PCR SCREENING  CBC  TROPONIN I  PROCALCITONIN  TROPONIN I  TROPONIN I  TROPONIN I  CBC WITH DIFFERENTIAL  BASIC METABOLIC PANEL    Imaging Review Dg Chest Portable 1 View  03/03/2014   CLINICAL DATA:  Shortness of breath with chest pain for 1 day.  EXAM: PORTABLE CHEST - 1 VIEW  COMPARISON:  01/15/2014 and 01/06/2014.  FINDINGS: 1832 hr. There are lower lung volumes. The heart size and mediastinal contours are stable. There are increased interstitial markings throughout the lungs associated with poor definition of the pulmonary vasculature, most consistent with pulmonary edema. There is no confluent airspace opacity or significant pleural effusion. There is no evidence of pneumothorax or acute osseous abnormality. Telemetry leads overlie the chest.  IMPRESSION: New diffuse  interstitial prominence most consistent with acute congestive heart failure. Atypical infection is considered less likely.   Electronically Signed   By: Camie Patience M.D.   On: 03/03/2014  18:48     EKG Interpretation None      MDM   Final diagnoses:  Acute congestive heart failure, unspecified congestive heart failure type  Shortness of breath    Consulted with Dr. Einar Gip who confirmed pt is on Eliquis so PE is not cause of SOB. Dr. Einar Gip also reported pt had recent hospitalization for acute respiratory failure.  Will get CBC, BMP, and CXR. Pt started on duoneb tx.    CBC: WNL   BMP: consistent with previous labs CXR: significant for new diffuse interstitial prominence most consistent with acute congestive heart failure.  Atypical infection considered less likely.    19:51 PM Consulted with Dr. Einar Gip to discuss pt's acute CHF.  Requested pt be admitted by hospitalist.   19:56: consulted with Dr. Hal Hope who agreed to admit pt. Requested pt be given 40mg  lasix. Troponin and BNP pending.        Noland Fordyce, PA-C 03/04/14 1539

## 2014-03-04 ENCOUNTER — Encounter (HOSPITAL_COMMUNITY): Payer: Self-pay | Admitting: Cardiology

## 2014-03-04 ENCOUNTER — Inpatient Hospital Stay (HOSPITAL_COMMUNITY): Payer: Medicare HMO

## 2014-03-04 DIAGNOSIS — E139 Other specified diabetes mellitus without complications: Secondary | ICD-10-CM

## 2014-03-04 LAB — CBC WITH DIFFERENTIAL/PLATELET
Basophils Absolute: 0 10*3/uL (ref 0.0–0.1)
Basophils Relative: 0 % (ref 0–1)
Eosinophils Absolute: 0.2 10*3/uL (ref 0.0–0.7)
Eosinophils Relative: 2 % (ref 0–5)
HCT: 39.7 % (ref 36.0–46.0)
Hemoglobin: 12.7 g/dL (ref 12.0–15.0)
Lymphocytes Relative: 12 % (ref 12–46)
Lymphs Abs: 1.1 10*3/uL (ref 0.7–4.0)
MCH: 30.2 pg (ref 26.0–34.0)
MCHC: 32 g/dL (ref 30.0–36.0)
MCV: 94.3 fL (ref 78.0–100.0)
Monocytes Absolute: 0.8 10*3/uL (ref 0.1–1.0)
Monocytes Relative: 8 % (ref 3–12)
Neutro Abs: 7.8 10*3/uL — ABNORMAL HIGH (ref 1.7–7.7)
Neutrophils Relative %: 78 % — ABNORMAL HIGH (ref 43–77)
Platelets: 225 10*3/uL (ref 150–400)
RBC: 4.21 MIL/uL (ref 3.87–5.11)
RDW: 15.2 % (ref 11.5–15.5)
WBC: 9.9 10*3/uL (ref 4.0–10.5)

## 2014-03-04 LAB — COMPREHENSIVE METABOLIC PANEL
ALT: 7 U/L (ref 0–35)
AST: 14 U/L (ref 0–37)
Albumin: 2.9 g/dL — ABNORMAL LOW (ref 3.5–5.2)
Alkaline Phosphatase: 87 U/L (ref 39–117)
BUN: 19 mg/dL (ref 6–23)
CO2: 19 mEq/L (ref 19–32)
Calcium: 8.7 mg/dL (ref 8.4–10.5)
Chloride: 102 mEq/L (ref 96–112)
Creatinine, Ser: 2.14 mg/dL — ABNORMAL HIGH (ref 0.50–1.10)
GFR calc Af Amer: 26 mL/min — ABNORMAL LOW (ref >90)
GFR calc non Af Amer: 23 mL/min — ABNORMAL LOW (ref >90)
Glucose, Bld: 217 mg/dL — ABNORMAL HIGH (ref 70–99)
Potassium: 4.4 mEq/L (ref 3.7–5.3)
Sodium: 137 mEq/L (ref 137–147)
Total Bilirubin: 0.5 mg/dL (ref 0.3–1.2)
Total Protein: 6.7 g/dL (ref 6.0–8.3)

## 2014-03-04 LAB — GLUCOSE, CAPILLARY
Glucose-Capillary: 160 mg/dL — ABNORMAL HIGH (ref 70–99)
Glucose-Capillary: 139 mg/dL — ABNORMAL HIGH (ref 70–99)
Glucose-Capillary: 95 mg/dL (ref 70–99)

## 2014-03-04 LAB — TROPONIN I
Troponin I: <0.3 ng/mL (ref <0.30)
Troponin I: <0.3 ng/mL (ref <0.30)
Troponin I: <0.3 ng/mL (ref <0.30)

## 2014-03-04 MED ORDER — AMIODARONE LOAD VIA INFUSION
150.0000 mg | Freq: Once | INTRAVENOUS | Status: AC
  Administered 2014-03-04: 150 mg via INTRAVENOUS
  Filled 2014-03-04: qty 83.34

## 2014-03-04 MED ORDER — FUROSEMIDE 10 MG/ML IJ SOLN
80.0000 mg | Freq: Three times a day (TID) | INTRAMUSCULAR | Status: DC
  Administered 2014-03-04 – 2014-03-05 (×2): 80 mg via INTRAVENOUS
  Filled 2014-03-04 (×4): qty 8

## 2014-03-04 MED ORDER — DEXTROSE 5 % IV SOLN
1.0000 g | Freq: Every day | INTRAVENOUS | Status: DC
  Administered 2014-03-04 (×2): 1 g via INTRAVENOUS
  Filled 2014-03-04 (×3): qty 1

## 2014-03-04 MED ORDER — ENSURE COMPLETE PO LIQD
237.0000 mL | Freq: Two times a day (BID) | ORAL | Status: DC
  Administered 2014-03-04 – 2014-03-13 (×10): 237 mL via ORAL

## 2014-03-04 MED ORDER — DILTIAZEM HCL 100 MG IV SOLR
5.0000 mg/h | INTRAVENOUS | Status: DC
  Administered 2014-03-04: 5 mg/h via INTRAVENOUS
  Administered 2014-03-04: 10 mg/h via INTRAVENOUS
  Administered 2014-03-05 – 2014-03-08 (×14): 20 mg/h via INTRAVENOUS
  Filled 2014-03-04 (×2): qty 100

## 2014-03-04 MED ORDER — ALBUTEROL SULFATE (2.5 MG/3ML) 0.083% IN NEBU
INHALATION_SOLUTION | RESPIRATORY_TRACT | Status: AC
  Filled 2014-03-04: qty 3

## 2014-03-04 MED ORDER — AMIODARONE HCL IN DEXTROSE 360-4.14 MG/200ML-% IV SOLN
30.0000 mg/h | INTRAVENOUS | Status: DC
  Administered 2014-03-05 – 2014-03-08 (×7): 30 mg/h via INTRAVENOUS
  Filled 2014-03-04 (×14): qty 200

## 2014-03-04 MED ORDER — VANCOMYCIN HCL 10 G IV SOLR
2000.0000 mg | Freq: Once | INTRAVENOUS | Status: AC
  Administered 2014-03-04: 2000 mg via INTRAVENOUS
  Filled 2014-03-04: qty 2000

## 2014-03-04 MED ORDER — ALBUTEROL SULFATE (2.5 MG/3ML) 0.083% IN NEBU
2.5000 mg | INHALATION_SOLUTION | RESPIRATORY_TRACT | Status: DC
  Administered 2014-03-04 – 2014-03-06 (×9): 2.5 mg via RESPIRATORY_TRACT
  Filled 2014-03-04 (×9): qty 3

## 2014-03-04 MED ORDER — AMIODARONE HCL IN DEXTROSE 360-4.14 MG/200ML-% IV SOLN
INTRAVENOUS | Status: AC
  Administered 2014-03-04: 60 mg/h via INTRAVENOUS
  Filled 2014-03-04: qty 200

## 2014-03-04 MED ORDER — DILTIAZEM LOAD VIA INFUSION
10.0000 mg | Freq: Once | INTRAVENOUS | Status: DC
  Filled 2014-03-04: qty 10

## 2014-03-04 MED ORDER — AMIODARONE HCL IN DEXTROSE 360-4.14 MG/200ML-% IV SOLN
60.0000 mg/h | INTRAVENOUS | Status: AC
  Administered 2014-03-04 – 2014-03-05 (×2): 60 mg/h via INTRAVENOUS
  Filled 2014-03-04: qty 200

## 2014-03-04 MED ORDER — CLONIDINE HCL 0.1 MG PO TABS
0.1000 mg | ORAL_TABLET | Freq: Two times a day (BID) | ORAL | Status: DC
  Administered 2014-03-04 (×2): 0.1 mg via ORAL
  Filled 2014-03-04 (×4): qty 1

## 2014-03-04 MED ORDER — DILTIAZEM LOAD VIA INFUSION
10.0000 mg | Freq: Once | INTRAVENOUS | Status: AC
  Administered 2014-03-04: 10 mg via INTRAVENOUS

## 2014-03-04 MED ORDER — VANCOMYCIN HCL IN DEXTROSE 1-5 GM/200ML-% IV SOLN
1000.0000 mg | INTRAVENOUS | Status: DC
  Administered 2014-03-05 – 2014-03-07 (×3): 1000 mg via INTRAVENOUS
  Filled 2014-03-04 (×3): qty 200

## 2014-03-04 MED ORDER — BENZONATATE 100 MG PO CAPS
100.0000 mg | ORAL_CAPSULE | Freq: Three times a day (TID) | ORAL | Status: DC
  Administered 2014-03-04 – 2014-03-05 (×5): 100 mg via ORAL
  Filled 2014-03-04 (×9): qty 1

## 2014-03-04 NOTE — Consult Note (Signed)
Advanced Heart Failure Team Consult Note   HPI:    Ms. Losacco is a 68 year old woman with hypertension, hyperlipidemia, bronchial asthma, diabetes mellitus, CKD stage IV (baseline cr [id number]) and diastolic HF. Her and her family have requested that Dr. Einar Gip contact the HF team for a HF evaluation.  She was admitted in 4/15 for R TKR. That hospitalization complicated by a/c diastolic HF with respiratory distress requiring non-rebreather support. Weight on discharge was 226 pounds. ABG at that time 7.4/34/91/97%. Cr peaked at 2.9.   She states that since going home she has not felt well. Has remained sob, weak and has had pbroblems with edema and weight got as high as 238. Dr. Einar Gip saw her and found her to have new AF. She was started on Eliquis and on the morning of 6/30 she underwent elect DC-CV of AF with Dr. Einar Gip. That evening she presented to the emergency room complaining of chest pain, shortness of breath and cough. CXR showed pulmonary edema. ECG with SR with PACs. Reportedly with diffuse wheezing on exam.   Since being admitted has been treated with IV lasix. Now over 3L out. She has also spiked fevers to 101.7 an f/u CXR suggestive of PNA. WBC 9.9 with mild left shift. She is now on Venturi mask with sats 91-93%. She has been started on vancomycin and cefipime. Trop negative x4.  Procalcitonin < 0.10  Review of Systems: [y] = yes, [ ]  = no   General: Weight gain [ ] ; Weight loss [ ] ; Anorexia [ ] ; Fatigue [ y]; Fever Blue.Reese ]; Chills [ ] ; Weakness Blue.Reese ]  Cardiac: Chest pain/pressure Blue.Reese ]; Resting SOB [ ] ; Exertional SOB Blue.Reese ]; Orthopnea [ ] ; Pedal Edema [ y]; Palpitations [ ] ; Syncope [ ] ; Presyncope [ ] ; Paroxysmal nocturnal dyspnea[ ]   Pulmonary: Cough Blue.Reese ]; Wheezing[ ] ; Hemoptysis[ ] ; Sputum [ ] ; Snoring [ ]   GI: Vomiting[ ] ; Dysphagia[ ] ; Melena[ ] ; Hematochezia [ ] ; Heartburn[ ] ; Abdominal pain [ ] ; Constipation [ ] ; Diarrhea [ ] ; BRBPR [ ]   GU: Hematuria[ ] ; Dysuria [ ] ; Nocturia[ ]     Vascular: Pain in legs with walking [ ] ; Pain in feet with lying flat [ ] ; Non-healing sores [ ] ; Stroke [ ] ; TIA [ ] ; Slurred speech [ ] ;  Neuro: Headaches[ ] ; Vertigo[ ] ; Seizures[ ] ; Paresthesias[ ] ;Blurred vision [ ] ; Diplopia [ ] ; Vision changes [ ]   Ortho/Skin: Arthritis Blue.Reese ]; Joint pain Blue.Reese ]; Muscle pain [ ] ; Joint swelling [ ] ; Back Pain [ ] ; Rash [ ]   Psych: Depression[ ] ; Anxiety[ ]   Heme: Bleeding problems [ ] ; Clotting disorders [ ] ; Anemia [ ]   Endocrine: Diabetes [ ] ; Thyroid dysfunction[ ]   . albuterol  2.5 mg Inhalation Q4H  . albuterol      . amitriptyline  25 mg Oral QHS  . amLODipine  10 mg Oral Daily  . antiseptic oral rinse  15 mL Mouth Rinse BID  . apixaban  5 mg Oral BID  . atorvastatin  20 mg Oral q1800  . benzonatate  100 mg Oral TID  . ceFEPime (MAXIPIME) IV  1 g Intravenous QHS  . cloNIDine  0.1 mg Oral BID  . famotidine  20 mg Oral Daily  . febuxostat  40 mg Oral Daily  . feeding supplement (ENSURE COMPLETE)  237 mL Oral BID BM  . furosemide  80 mg Intravenous Q6H  . insulin aspart  0-9 Units Subcutaneous TID WC  . insulin aspart  20 Units Subcutaneous TID PC  . insulin glargine  40 Units Subcutaneous Daily  . Linaclotide  290 mcg Oral Daily  . metoprolol  50 mg Oral BID  . pantoprazole  40 mg Oral Daily  . potassium chloride SA  20 mEq Oral BID  . sodium chloride  3 mL Intravenous Q12H  . [START ON 03/05/2014] vancomycin  1,000 mg Intravenous Q24H    Past Medical History: Past Medical History  Diagnosis Date  . Atrial flutter   . Hypertension   . Diabetes mellitus   . Diastolic heart failure   . Asthma   . Hyperlipidemia   . Fatty liver   . Esophageal dysmotility   . Arthritis   . Congestive heart failure   . Sleep apnea     wears CPAP  . Family history of malignant neoplasm of gastrointestinal tract   . Fatty tumor fatty tumor back  . Coronary atherosclerosis of native coronary artery   . Morbid obesity   . Myocardial infarction 2009   . Dysrhythmia     afib,flutter hx  . Heart murmur   . Peripheral vascular disease   . Shortness of breath   . Pneumonia     hx  . GERD (gastroesophageal reflux disease)     barrets esophagus  . Anginal pain     occ; non-ischemic Lexiscan 09/2012  . Kidney disease     CKD stage IV (Dr. Erling Cruz)    Past Surgical History: Past Surgical History  Procedure Laterality Date  . Coronary angioplasty with stent placement    . Breast lumpectomy      right  . Tubal ligation    . Tonsillectomy    . Total knee arthroplasty Right 12/15/2013    Procedure: RIGHT TOTAL KNEE ARTHROPLASTY;  Surgeon: Alta Corning, MD;  Location: [facility];  Service: Orthopedics;  Laterality: Right;  . Cardioversion N/A 03/03/2014    Procedure: CARDIOVERSION;  Surgeon: Laverda Page, MD;  Location: [facility] ENDOSCOPY;  Service: Cardiovascular;  Laterality: N/A;    Family History: Family History  Problem Relation Age of Onset  . Heart disease Mother   . Cancer Mother     bladder  . Kidney disease Mother   . Ovarian cancer Daughter   . Stomach cancer Maternal Uncle   . Colon cancer Maternal Aunt   . Esophageal cancer Neg Hx     Social History: History   Social History  . Marital Status: Widowed    Spouse Name: N/A    Number of Children: 3  . Years of Education: N/A   Social History Main Topics  . Smoking status: Never Smoker   . Smokeless tobacco: Never Used  . Alcohol Use: No  . Drug Use: No  . Sexual Activity: Not Currently   Other Topics Concern  . None   Social History Narrative  . None    Allergies:  Allergies  Allergen Reactions  . Codeine Nausea And Vomiting  . Penicillins Nausea And Vomiting  . Sulfa Antibiotics Itching and Nausea And Vomiting    "everything I seen was red"  . Other Itching    Adhesive from ekg leads    Objective:    Vital Signs:   Temp:  [98.3 F (36.8 C)-101.7 F (38.7 C)] 99.4 F (37.4 C) (07/01 1157) Pulse Rate:  [68-103] 85 (07/01 1600) Resp:   [18-29] 27 (07/01 1600) BP: (112-187)/(33-109) 137/57 mmHg (07/01 1600) SpO2:  [91 %-100 %] 91 % (07/01 1600) FiO2 (%):  [  4 %-40 %] 40 % (07/01 1600) Weight:  [86.8 kg (191 lb 5.8 oz)] 86.8 kg (191 lb 5.8 oz) (07/01 0500)   Filed Weights   03/03/14 1754 03/04/14 0500  Weight: 104.327 kg (230 lb) 86.8 kg (191 lb 5.8 oz)    Physical Exam: General:  Obese woman lying in bed on face mask HEENT: normal Neck: supple. JVP hard to see but looks to be elevated to jaw. Carotids 2+ bilat; no bruits. No lymphadenopathy or thryomegaly appreciated. Cor: PMI nonpalpbale. Distant. Irregular. No obvious murmur Lungs: diffuse rhonchi Abdomen:  Obese soft, nontender,  +distended. No hepatosplenomegaly. No bruits or masses. Good bowel sounds. Extremities: no cyanosis, clubbing, rash, tr edema warm Neuro: alert & orientedx3, cranial nerves grossly intact. moves all 4 extremities w/o difficulty. Affect pleasant  Telemetry: SR with PACs  Labs: Basic Metabolic Panel:  Recent Labs Lab 03/03/14 0908 03/03/14 1851 03/04/14 0338  NA 143 142 137  K 3.8 4.0 4.4  CL  --  104 102  CO2  --  21 19  GLUCOSE 81 165* 217*  BUN  --  18 19  CREATININE  --  2.14* 2.14*  CALCIUM  --  9.4 8.7    Liver Function Tests:  Recent Labs Lab 03/04/14 0338  AST 14  ALT 7  ALKPHOS 87  BILITOT 0.5  PROT 6.7  ALBUMIN 2.9*   No results found for this basename: LIPASE, AMYLASE,  in the last 168 hours No results found for this basename: AMMONIA,  in the last 168 hours  CBC:  Recent Labs Lab 03/03/14 0908 03/03/14 1851 03/04/14 0855  WBC  --  9.8 9.9  NEUTROABS  --   --  7.8*  HGB 12.6 13.0 12.7  HCT 37.0 40.2 39.7  MCV  --  93.7 94.3  PLT  --  234 225    Cardiac Enzymes:  Recent Labs Lab 03/03/14 1851 03/03/14 2255 03/04/14 0338 03/04/14 1040  TROPONINI <0.30 <0.30 <0.30 <0.30    BNP: BNP (last 3 results)  Recent Labs  03/03/14 1851  PROBNP 1531.0*    CBG:  Recent Labs Lab  03/03/14 1034 03/03/14 2216 03/04/14 0824 03/04/14 1201 03/04/14 1639  GLUCAP 105* 178* 160* 139* 95    Coagulation Studies: No results found for this basename: LABPROT, INR,  in the last 72 hours  Other results: EKG: SR with PACs  Imaging: Dg Chest 2 View  03/04/2014   CLINICAL DATA:  Shortness of breath, fever.  EXAM: CHEST  2 VIEW  COMPARISON:  March 03, 2014.  FINDINGS: Stable cardiomediastinal silhouette. New airspace opacity is noted in the right upper lobe, as well as larger airspace opacity seen in the left upper lobe. These findings are most consistent with pneumonia. No significant pleural effusion or pneumothorax is noted. Bony thorax appears intact.  IMPRESSION: Interval development of bilateral upper lobe airspace opacities are noted consistent with pneumonia.   Electronically Signed   By: Sabino Dick M.D.   On: 03/04/2014 09:43   Dg Chest Portable 1 View  03/03/2014   CLINICAL DATA:  Shortness of breath with chest pain for 1 day.  EXAM: PORTABLE CHEST - 1 VIEW  COMPARISON:  01/15/2014 and 01/06/2014.  FINDINGS: 1832 hr. There are lower lung volumes. The heart size and mediastinal contours are stable. There are increased interstitial markings throughout the lungs associated with poor definition of the pulmonary vasculature, most consistent with pulmonary edema. There is no confluent airspace opacity or significant pleural effusion. There  is no evidence of pneumothorax or acute osseous abnormality. Telemetry leads overlie the chest.  IMPRESSION: New diffuse interstitial prominence most consistent with acute congestive heart failure. Atypical infection is considered less likely.   Electronically Signed   By: Camie Patience M.D.   On: 03/03/2014 18:48         Assessment:   1. Acute respiratory failure  2. A/c chronic diastolic HF 3. Probable PNA 4. Chronic renal failure, stage IV 5. Obesity 6. AF s/p DC-CV on 6/30 7. HTN 8. DM2  Plan/Discussion:    Suspect her  respiratory failure is multifactorial due to ADHF and possible PNA. Agree with diuresis, broad spectrum abx and pulmonary toilet.  Continue Eliquis with recent DC-CV. We will follow. Discussed with patient and her family at bedside.  Length of Stay: 1   [name] MD 03/04/2014, 6:11 PM  Advanced Heart Failure Team Pager [phone] (M-F; 7a - 4p)  Please contact [facility] for night-coverage after hours (4p -7a ) and weekends on [email or web address]

## 2014-03-04 NOTE — Progress Notes (Signed)
DEsats to 84% pulse ox on room air. Placed back on o2 4l [place] -pulse ox- 98%

## 2014-03-04 NOTE — Consult Note (Signed)
Reason for Consult: CKD stage 4, in the setting of CHF   Referring Physician: Dr. Sherlie Ban  Jayani Barry Dienes is an 68 y.o. female With PMH of DM, HTN, CHF, Asthma, CKD4, atria fib, presented to the ED yesterday with complaints of SOB that got worse after cardioversion yesterday. Pt had a max temp yesterday of 101.7.  CBC without leukocytosis. Chest xray on admission suggestive of congestion. Pt was given IV lasix - 80mg . Repeat chest xray today by primary team suggestive of PNA. Nephrology asked to consult in this pt with CKD4 with possible CHF exacerbation.  Trend in Creatinine: Creatinine, Ser  Date/Time Value Ref Range Status  03/04/2014  3:38 AM 2.14* 0.50 - 1.10 mg/dL Final  03/03/2014  6:51 PM 2.14* 0.50 - 1.10 mg/dL Final  12/23/2013  5:40 AM 2.96* 0.50 - 1.10 mg/dL Final  12/22/2013  2:39 AM 2.95* 0.50 - 1.10 mg/dL Final  12/21/2013  3:55 AM 2.61* 0.50 - 1.10 mg/dL Final  12/20/2013  3:15 AM 2.72* 0.50 - 1.10 mg/dL Final  12/19/2013  3:37 AM 2.82* 0.50 - 1.10 mg/dL Final  12/18/2013  9:14 AM 2.84* 0.50 - 1.10 mg/dL Final  12/17/2013  4:31 AM 2.49* 0.50 - 1.10 mg/dL Final  12/16/2013  4:20 AM 1.96* 0.50 - 1.10 mg/dL Final  12/15/2013 10:36 AM 2.07* 0.50 - 1.10 mg/dL Final  12/09/2013  1:30 PM 2.37* 0.50 - 1.10 mg/dL Final  12/21/2008  5:15 AM 2.86* 0.4 - 1.2 mg/dL Final  12/20/2008  4:05 AM 2.75* 0.4 - 1.2 mg/dL Final  12/19/2008  5:10 AM 2.98* 0.4 - 1.2 mg/dL Final  12/18/2008  2:30 PM 3.28* 0.4 - 1.2 mg/dL Final  06/03/2008  3:29 AM 1.66*  Final  06/02/2008  6:35 AM 1.50*  Final  06/01/2008  3:35 AM 1.56*  Final  05/31/2008  3:30 AM 1.73*  Final  05/30/2008 12:30 PM 1.97*  Final  05/29/2008 12:56 PM 2.38*  Final  02/18/2008 10:35 AM 1.88*  Final  05/28/2007  5:35 AM 1.87*  Final  05/27/2007  6:01 AM 1.66*  Final  05/26/2007  4:45 AM 1.75*  Final  05/25/2007  5:05 AM 1.62*  Final  05/24/2007 10:54 AM 1.77*  Final  04/20/2007  5:30 AM 1.52*  Final  04/19/2007  4:25 AM 1.64*  Final  04/18/2007   3:50 AM 1.98*  Final  04/17/2007  4:30 AM 2.02*  Final  04/16/2007  3:25 AM 2.23*  Final  04/15/2007  2:35 PM 2.27*  Final    PMH:   Past Medical History  Diagnosis Date  . Atrial flutter   . Hypertension   . Diabetes mellitus   . Diastolic heart failure   . Asthma   . Hyperlipidemia   . Fatty liver   . Esophageal dysmotility   . Arthritis   . Congestive heart failure   . Sleep apnea     wears CPAP  . Family history of malignant neoplasm of gastrointestinal tract   . Fatty tumor fatty tumor back  . Coronary atherosclerosis of native coronary artery   . Morbid obesity   . Myocardial infarction 2009  . Dysrhythmia     afib,flutter hx  . Heart murmur   . Peripheral vascular disease   . Shortness of breath   . Pneumonia     hx  . GERD (gastroesophageal reflux disease)     barrets esophagus  . Anginal pain     occ; non-ischemic Lexiscan 09/2012  . Kidney disease  CKD stage IV (Dr. Erling Cruz)    PSH:   Past Surgical History  Procedure Laterality Date  . Coronary angioplasty with stent placement    . Breast lumpectomy      right  . Tubal ligation    . Tonsillectomy    . Total knee arthroplasty Right 12/15/2013    Procedure: RIGHT TOTAL KNEE ARTHROPLASTY;  Surgeon: Alta Corning, MD;  Location: [facility];  Service: Orthopedics;  Laterality: Right;  . Cardioversion N/A 03/03/2014    Procedure: CARDIOVERSION;  Surgeon: Laverda Page, MD;  Location: [facility];  Service: Cardiovascular;  Laterality: N/A;    Allergies:  Allergies  Allergen Reactions  . Codeine Nausea And Vomiting  . Penicillins Nausea And Vomiting  . Sulfa Antibiotics Itching and Nausea And Vomiting    "everything I seen was red"  . Other Itching    Adhesive from ekg leads    Medications:   Prior to Admission medications   Medication Sig Start Date End Date Taking? Authorizing Provider  acetaminophen (TYLENOL) 500 MG tablet Take 500 mg by mouth every 6 (six) hours as needed for mild pain.    Yes Historical Provider, MD  albuterol (PROVENTIL HFA;VENTOLIN HFA) 108 (90 BASE) MCG/ACT inhaler Inhale 2 puffs into the lungs every 4 (four) hours as needed for wheezing.    Yes Historical Provider, MD  amitriptyline (ELAVIL) 25 MG tablet Take 25 mg by mouth at bedtime.     Yes Historical Provider, MD  amLODipine (NORVASC) 10 MG tablet Take 10 mg by mouth daily.   Yes Roxy Cedar, CMA  apixaban (ELIQUIS) 5 MG TABS tablet Take 5 mg by mouth 2 (two) times daily.   Yes Historical Provider, MD  cloNIDine (CATAPRES) 0.3 MG tablet Take 0.3 mg by mouth 2 (two) times daily.     Yes Historical Provider, MD  clopidogrel (PLAVIX) 75 MG tablet Take 75 mg by mouth daily.    Yes Historical Provider, MD  dicyclomine (BENTYL) 10 MG capsule Take 10 mg by mouth 4 (four) times daily as needed (IBS).   Yes Historical Provider, MD  esomeprazole (NEXIUM) 40 MG capsule Take 40 mg by mouth daily at 12 noon.   Yes Historical Provider, MD  febuxostat (ULORIC) 40 MG tablet Take 40 mg by mouth daily.    Yes Historical Provider, MD  insulin glargine (LANTUS) 100 UNIT/ML injection Inject 0.4 mLs (40 Units total) into the skin daily. 12/23/13  Yes Delfina Redwood, MD  insulin lispro (HUMALOG) 100 UNIT/ML injection Inject 0.2 mLs (20 Units total) into the skin 3 (three) times daily after meals. 12/23/13  Yes Delfina Redwood, MD  Linaclotide Rolan Lipa) 145 MCG CAPS capsule Take 290 mcg by mouth daily.   Yes Historical Provider, MD  methocarbamol (ROBAXIN-750) 750 MG tablet Take 1 tablet (750 mg total) by mouth every 8 (eight) hours as needed for muscle spasms. 12/23/13  Yes Erlene Senters, PA-C  metoprolol (LOPRESSOR) 50 MG tablet Take 50 mg by mouth 2 (two) times daily.   Yes Historical Provider, MD  nitroGLYCERIN (NITROSTAT) 0.4 MG SL tablet Place 0.4 mg under the tongue every 5 (five) minutes as needed for chest pain.    Yes Historical Provider, MD  oxyCODONE-acetaminophen (PERCOCET/ROXICET) 5-325 MG per tablet Take 1-2  tablets by mouth every 6 (six) hours as needed for severe pain. 12/23/13  Yes Delfina Redwood, MD  potassium chloride SA (K-DUR,KLOR-CON) 20 MEQ tablet Take 20 mEq by mouth 2 (two) times daily.  Yes Historical Provider, MD  ranitidine (ZANTAC) 150 MG tablet Take 1 tablet (150 mg total) by mouth at bedtime. 07/29/13  Yes Willia Craze, NP  rosuvastatin (CRESTOR) 10 MG tablet Take 10 mg by mouth daily.     Yes Historical Provider, MD  torsemide (DEMADEX) 20 MG tablet Take 40 mg by mouth 2 (two) times daily.   Yes Historical Provider, MD  Vitamin D, Ergocalciferol, (DRISDOL) 50000 UNITS CAPS capsule Take 50,000 Units by mouth every Monday.   Yes Historical Provider, MD  zolpidem (AMBIEN) 10 MG tablet Take 10 mg by mouth at bedtime as needed for sleep.    Yes Historical Provider, MD    Inpatient medications: . amitriptyline  25 mg Oral QHS  . amLODipine  10 mg Oral Daily  . antiseptic oral rinse  15 mL Mouth Rinse BID  . apixaban  5 mg Oral BID  . atorvastatin  20 mg Oral q1800  . benzonatate  100 mg Oral TID  . ceFEPime (MAXIPIME) IV  1 g Intravenous QHS  . cloNIDine  0.1 mg Oral BID  . famotidine  20 mg Oral Daily  . febuxostat  40 mg Oral Daily  . furosemide  80 mg Intravenous Q6H  . insulin aspart  0-9 Units Subcutaneous TID WC  . insulin aspart  20 Units Subcutaneous TID PC  . insulin glargine  40 Units Subcutaneous Daily  . Linaclotide  290 mcg Oral Daily  . metoprolol  50 mg Oral BID  . pantoprazole  40 mg Oral Daily  . potassium chloride SA  20 mEq Oral BID  . sodium chloride  3 mL Intravenous Q12H  . [START ON 03/05/2014] vancomycin  1,000 mg Intravenous Q24H    Discontinued Meds:   Medications Discontinued During This Encounter  Medication Reason  . insulin lispro (HUMALOG) injection 20 Units   . levofloxacin (LEVAQUIN) IVPB 750 mg   . levofloxacin (LEVAQUIN) IVPB 500 mg   . aspirin EC tablet 81 mg   . clopidogrel (PLAVIX) tablet 75 mg   . cloNIDine (CATAPRES)  tablet 0.3 mg   . nitroGLYCERIN (NITRODUR - Dosed in mg/24 hr) patch 0.4 mg     Social History:  reports that she has never smoked. She has never used smokeless tobacco. She reports that she does not drink alcohol or use illicit drugs.  Family History:   Family History  Problem Relation Age of Onset  . Heart disease Mother   . Cancer Mother     bladder  . Kidney disease Mother   . Ovarian cancer Daughter   . Stomach cancer Maternal Uncle   . Colon cancer Maternal Aunt   . Esophageal cancer Neg Hx    SKIN- No Rash, colour changes or itching. HEAD- No Headache or dizziness. RESPIRATORY- SOB CARDIAC- No Palpitations, DOE, PND or chest pain. GI- Has nausea, no diarrhoea, constipation or  abd pain. URINARY- No Frequency, urgency, straining or dysuria. NEUROLOGIC- No Numbness, syncope, seizures or burning.  Weight change:   Intake/Output Summary (Last 24 hours) at 03/04/14 1515 Last data filed at 03/04/14 1456  Gross per 24 hour  Intake    810 ml  Output   2550 ml  Net  -1740 ml   BP 146/109  Pulse 83  Temp(Src) 99.4 F (37.4 C) (Oral)  Resp 20  Ht 5\' 4"  (1.626 m)  Wt 191 lb 5.8 oz (86.8 kg)  BMI 32.83 kg/m2  SpO2 93% Filed Vitals:   03/04/14 0800 03/04/14 1008 03/04/14  1157 03/04/14 1200  BP: 147/100  120/60 146/109  Pulse: 75  83 83  Temp:   99.4 F (37.4 C)   TempSrc:   Oral   Resp: 25  19 20   Height:      Weight:      SpO2: 91% 96% 92% 93%     GENERAL- alert, co-operative, appears as stated age, in moderate respiratory distress. HEENT- Atraumatic, normocephalic, EOMI, oral mucosa appears moist,  neck supple. CARDIAC- Irregular, no murmurs, rubs or gallops. RESP- Moving equal volumes of air, Wheezing present in all lung zones, chest appears tight, no crackles appreciated.  ABDOMEN- Soft, full, nontender, no guarding or rebound, no palpable masses or organomegaly, bowel sounds present. NEURO- No obvious Cr N abnormality, moving all extremities voluntarily.   EXTREMITIES- pulse 2+, symmetric, no pedal edema. SKIN- Warm, dry, No rash or lesion. PSYCH- Normal mood and affect, appropriate thought content and speech.  Labs: Basic Metabolic Panel:  Recent Labs Lab 03/03/14 0908 03/03/14 1851 03/04/14 0338  NA 143 142 137  K 3.8 4.0 4.4  CL  --  104 102  CO2  --  21 19  GLUCOSE 81 165* 217*  BUN  --  18 19  CREATININE  --  2.14* 2.14*  ALBUMIN  --   --  2.9*  CALCIUM  --  9.4 8.7   Liver Function Tests:  Recent Labs Lab 03/04/14 0338  AST 14  ALT 7  ALKPHOS 87  BILITOT 0.5  PROT 6.7  ALBUMIN 2.9*   CBC:  Recent Labs Lab 03/03/14 0908 03/03/14 1851 03/04/14 0855  WBC  --  9.8 9.9  NEUTROABS  --   --  7.8*  HGB 12.6 13.0 12.7  HCT 37.0 40.2 39.7  MCV  --  93.7 94.3  PLT  --  234 225   ) Recent Labs Lab 03/03/14 1851 03/03/14 2255 03/04/14 0338 03/04/14 1040  TROPONINI <0.30 <0.30 <0.30 <0.30   CBG:  Recent Labs Lab 03/03/14 0929 03/03/14 1034 03/03/14 2216 03/04/14 0824 03/04/14 1201  GLUCAP 74 105* 178* 160* 139*    Xrays/Other Studies: Dg Chest 2 View  03/04/2014   CLINICAL DATA:  Shortness of breath, fever.  EXAM: CHEST  2 VIEW  COMPARISON:  March 03, 2014.  FINDINGS: Stable cardiomediastinal silhouette. New airspace opacity is noted in the right upper lobe, as well as larger airspace opacity seen in the left upper lobe. These findings are most consistent with pneumonia. No significant pleural effusion or pneumothorax is noted. Bony thorax appears intact.  IMPRESSION: Interval development of bilateral upper lobe airspace opacities are noted consistent with pneumonia.   Electronically Signed   By: Sabino Dick M.D.   On: 03/04/2014 09:43   Dg Chest Portable 1 View  03/03/2014   CLINICAL DATA:  Shortness of breath with chest pain for 1 day.  EXAM: PORTABLE CHEST - 1 VIEW  COMPARISON:  01/15/2014 and 01/06/2014.  FINDINGS: 1832 hr. There are lower lung volumes. The heart size and mediastinal contours are  stable. There are increased interstitial markings throughout the lungs associated with poor definition of the pulmonary vasculature, most consistent with pulmonary edema. There is no confluent airspace opacity or significant pleural effusion. There is no evidence of pneumothorax or acute osseous abnormality. Telemetry leads overlie the chest.  IMPRESSION: New diffuse interstitial prominence most consistent with acute congestive heart failure. Atypical infection is considered less likely.   Electronically Signed   By: Camie Patience M.D.   On:  03/03/2014 18:48    Assessment/Plan: 1. Acute respiratory distress- Likely form Asthma exacerbation and PNA- Wheezing present on exam, with chest xray findings- Cont albuterol nebs, Scheduled, also on antibiotics- Vanc and cefepime. Fluid overload less likely playing a role, still SOB after 2.5L of IVF, on IV lasix 80mg  TID.  2. CKD4- Cr today- 2.14, improved compared to prior.- Cont to monitor.  3. DM- Per primary team.  4. HTN- Blood pressure WNL, management per Primary team.  5. Atria fib- Management per Cards.   [place]pae, Ejiroghene IMTS PGY-2 03/04/2014, 3:15 PM    I have seen and examined this patient and agree with plan as outlined by Dr. Denton Brick.  Mrs. Ephraim's renal function is at baseline.  Her respiratory issues appear to be related to reactive airway disease and possible PNA.  Will change nebs to scheduled rather than prn, cont to follow daily Scr and UOP as she appears to be responding to IV Lasix. Cherese Lozano A,MD 03/05/2014 8:16 AM

## 2014-03-04 NOTE — Progress Notes (Signed)
INITIAL NUTRITION ASSESSMENT  DOCUMENTATION CODES Per approved criteria  -Obesity Unspecified   INTERVENTION: - Ensure Complete BID - Encouraged increased meal intake - RD to continue to monitor   NUTRITION DIAGNOSIS: Inadequate oral intake related to poor appetite as evidenced by 25% meal intake.   Goal: Pt to consume >90% of meals/supplements  Monitor:  Weights, labs, intake  Reason for Assessment: Malnutrition screening tool   68 y.o. female  Admitting Dx: Acute respiratory failure  ASSESSMENT: Pt with history of recently diagnosed atrial fibrillation and placed on apixaban and had cardioversion yesterday morning started experiencing shortness of breath since yesterday evening.   - Pt and daughter in room and report pt with poor appetite for the past 2 weeks - Daughter reports pt has been eating well but just hasn't been hungry but she has been forcing pt to eat - Weight was 230 pounds yesterday, currently 191 pounds - question accuracy of this weight - Pt c/o ongoing shortness of breath, not improved since admission - Reports she follows low sodium diet at home - Only had a few bites of jello today - Agreeable to trying Ensure Complete during admission    Height: Ht Readings from Last 1 Encounters:  03/03/14 5\' 4"  (1.626 m)    Weight: Wt Readings from Last 1 Encounters:  03/04/14 191 lb 5.8 oz (86.8 kg)    Ideal Body Weight: 120 lbs   % Ideal Body Weight: 159%  Wt Readings from Last 10 Encounters:  03/04/14 191 lb 5.8 oz (86.8 kg)  03/03/14 230 lb (104.327 kg)  03/03/14 230 lb (104.327 kg)  12/23/13 226 lb 13.7 oz (102.9 kg)  12/23/13 226 lb 13.7 oz (102.9 kg)  12/09/13 241 lb 14.4 oz (109.725 kg)  08/15/13 242 lb (109.77 kg)  07/29/13 242 lb 8 oz (109.997 kg)  06/27/13 244 lb 3.2 oz (110.768 kg)  03/28/13 236 lb 8 oz (107.276 kg)    Usual Body Weight: 230 lbs   % Usual Body Weight: 83% - unsure if current weight is accurate   BMI:  Body mass  index is 32.83 kg/(m^2). Class I obesity - suspect actual BMI higher than this   Estimated Nutritional Needs: Kcal: 1400-1600 Protein: 55-70g Fluid: 1.4-1.6L/day   Skin: Intact  Diet Order:  Heart healthy/diabetic diet  EDUCATION NEEDS: -No education needs identified at this time   Intake/Output Summary (Last 24 hours) at 03/04/14 1505 Last data filed at 03/04/14 1456  Gross per 24 hour  Intake    810 ml  Output   2550 ml  Net  -1740 ml    Last BM: PTA  Labs:   Recent Labs Lab 03/03/14 0908 03/03/14 1851 03/04/14 0338  NA 143 142 137  K 3.8 4.0 4.4  CL  --  104 102  CO2  --  21 19  BUN  --  18 19  CREATININE  --  2.14* 2.14*  CALCIUM  --  9.4 8.7  GLUCOSE 81 165* 217*    CBG (last 3)   Recent Labs  03/03/14 2216 03/04/14 0824 03/04/14 1201  GLUCAP 178* 160* 139*    Scheduled Meds: . amitriptyline  25 mg Oral QHS  . amLODipine  10 mg Oral Daily  . antiseptic oral rinse  15 mL Mouth Rinse BID  . apixaban  5 mg Oral BID  . atorvastatin  20 mg Oral q1800  . benzonatate  100 mg Oral TID  . ceFEPime (MAXIPIME) IV  1 g Intravenous QHS  .  cloNIDine  0.1 mg Oral BID  . famotidine  20 mg Oral Daily  . febuxostat  40 mg Oral Daily  . furosemide  80 mg Intravenous Q6H  . insulin aspart  0-9 Units Subcutaneous TID WC  . insulin aspart  20 Units Subcutaneous TID PC  . insulin glargine  40 Units Subcutaneous Daily  . Linaclotide  290 mcg Oral Daily  . metoprolol  50 mg Oral BID  . pantoprazole  40 mg Oral Daily  . potassium chloride SA  20 mEq Oral BID  . sodium chloride  3 mL Intravenous Q12H  . [START ON 03/05/2014] vancomycin  1,000 mg Intravenous Q24H    Continuous Infusions:   Past Medical History  Diagnosis Date  . Atrial flutter   . Hypertension   . Diabetes mellitus   . Diastolic heart failure   . Asthma   . Hyperlipidemia   . Fatty liver   . Esophageal dysmotility   . Arthritis   . Congestive heart failure   . Sleep apnea     wears  CPAP  . Family history of malignant neoplasm of gastrointestinal tract   . Fatty tumor fatty tumor back  . Coronary atherosclerosis of native coronary artery   . Morbid obesity   . Myocardial infarction 2009  . Dysrhythmia     afib,flutter hx  . Heart murmur   . Peripheral vascular disease   . Shortness of breath   . Pneumonia     hx  . GERD (gastroesophageal reflux disease)     barrets esophagus  . Anginal pain     occ; non-ischemic Lexiscan 09/2012  . Kidney disease     CKD stage IV (Dr. Erling Cruz)    Past Surgical History  Procedure Laterality Date  . Coronary angioplasty with stent placement    . Breast lumpectomy      right  . Tubal ligation    . Tonsillectomy    . Total knee arthroplasty Right 12/15/2013    Procedure: RIGHT TOTAL KNEE ARTHROPLASTY;  Surgeon: Alta Corning, MD;  Location: [facility];  Service: Orthopedics;  Laterality: Right;  . Cardioversion N/A 03/03/2014    Procedure: CARDIOVERSION;  Surgeon: Laverda Page, MD;  Location: [facility];  Service: Cardiovascular;  Laterality: N/A;    Carlis Stable MS, [place], [place] Pager [phone] Weekend/After Hours Pager

## 2014-03-04 NOTE — Progress Notes (Signed)
Offered Pt. A bath, Pt. Stated she was freezing. Tech applied a warm bath to pt. Will recheck Pt. To try bath again.

## 2014-03-04 NOTE — Discharge Instructions (Signed)
Information on my medicine - ELIQUIS (apixaban)  This medication education was reviewed with me or my healthcare representative as part of my discharge preparation.  The pharmacist that spoke with me during my hospital stay was:  Georgina Peer, [facility]  Why was Eliquis prescribed for you? Eliquis was prescribed for you to reduce the risk of a blood clot forming that can cause a stroke if you have a medical condition called atrial fibrillation (a type of irregular heartbeat).  What do You need to know about Eliquis ? Take your Eliquis TWICE DAILY - one tablet in the morning and one tablet in the evening with or without food. If you have difficulty swallowing the tablet whole please discuss with your pharmacist how to take the medication safely.  Take Eliquis exactly as prescribed by your doctor and DO NOT stop taking Eliquis without talking to the doctor who prescribed the medication.  Stopping may increase your risk of developing a stroke.  Refill your prescription before you run out.  After discharge, you should have regular check-up appointments with your healthcare provider that is prescribing your Eliquis.  In the future your dose may need to be changed if your kidney function or weight changes by a significant amount or as you get older.  What do you do if you miss a dose? If you miss a dose, take it as soon as you remember on the same day and resume taking twice daily.  Do not take more than one dose of ELIQUIS at the same time to make up a missed dose.  Important Safety Information A possible side effect of Eliquis is bleeding. You should call your healthcare provider right away if you experience any of the following:   Bleeding from an injury or your nose that does not stop.   Unusual colored urine (red or dark brown) or unusual colored stools (red or black).   Unusual bruising for unknown reasons.   A serious fall or if you hit your head (even if there is no  bleeding).  Some medicines may interact with Eliquis and might increase your risk of bleeding or clotting while on Eliquis. To help avoid this, consult your healthcare provider or pharmacist prior to using any new prescription or non-prescription medications, including herbals, vitamins, non-steroidal anti-inflammatory drugs (NSAIDs) and supplements.  This website has more information on Eliquis (apixaban): [email or web address].

## 2014-03-04 NOTE — Progress Notes (Addendum)
Subjective:  Still dyspneic. Chest pain better  Objective:  Vital Signs in the last 24 hours: Temp:  [97.8 F (36.6 C)-101.7 F (38.7 C)] 98.3 F (36.8 C) (07/01 0700) Pulse Rate:  [48-106] 75 (07/01 0800) Resp:  [18-29] 25 (07/01 0800) BP: (112-187)/(33-136) 147/100 mmHg (07/01 0800) SpO2:  [86 %-100 %] 91 % (07/01 0800) FiO2 (%):  [4 %] 4 % (07/01 0700) Weight:  [86.8 kg (191 lb 5.8 oz)-104.327 kg (230 lb)] 86.8 kg (191 lb 5.8 oz) (07/01 0500)  Intake/Output from previous day: 06/30 0701 - 07/01 0700 In: 700 [IV Piggyback:700] Out: [id number] [Urine:1550]  Physical Exam: General appearance: alert, cooperative, appears stated age, mild distress and morbidly obese  Lungs: Bilateral diffuse extensive expiratory wheezes and crackles heard.  Chest wall: Precordial tenderness present.  Heart: S1, S2 normal, No gallop No murmur appreciated.  Abdomen: soft, non-tender; bowel sounds normal; no masses, no organomegaly and Pannus present  Extremities: extremities normal, atraumatic, no cyanosis or edema Lab Results: BMP  Recent Labs  12/23/13 0540 03/03/14 0908 03/03/14 1851 03/04/14 0338  NA 141 143 142 137  K 4.1 3.8 4.0 4.4  CL 95*  --  104 102  CO2 26  --  21 19  GLUCOSE 215* 81 165* 217*  BUN 56*  --  18 19  CREATININE 2.96*  --  2.14* 2.14*  CALCIUM 10.8*  --  9.4 8.7  GFRNONAA 15*  --  23* 23*  GFRAA 18*  --  26* 26*    CBC  Recent Labs Lab 03/03/14 1851  WBC 9.8  RBC 4.29  HGB 13.0  HCT 40.2  PLT 234  MCV 93.7  MCH 30.3  MCHC 32.3  RDW 15.2    HEMOGLOBIN A1C No results found for this basename: HGBA1C, MPG    Cardiac Panel (last 3 results)  Recent Labs  03/03/14 1851 03/03/14 2255 03/04/14 0338  TROPONINI <0.30 <0.30 <0.30     Recent Labs  03/03/14 1851  PROBNP 1531.0*     Hepatic Function Panel  Recent Labs  12/09/13 1330 12/15/13 1036 03/04/14 0338  PROT 7.5 7.4 6.7  ALBUMIN 3.7 3.7 2.9*  AST 23 20 14   ALT 14 12 7   ALKPHOS 66  60 87  BILITOT 0.3 0.4 0.5    Imaging: Imaging results have been reviewed  Cardiac Studies:  Echocardiogram 12/18/2013: Normal left ventricle systolic function, grade 2 diastolic dysfunction.  No significant valvular abnormality.  EKG: EKG: 03/03/2014 at 5:55 PM: Sinus rhythm with borderline first degree AV block, left ventricular hypertrophy with repolarization of normality, cannot exclude lateral ischemia. Frequent PACs.  unchanged from previous tracings. Tele, No VT, No other arrhythmias  Out Patient Lexiscan Myoview stress test 02/27/2014: 1. Resting EKG A. Fibrillation with non specific ST-T changes. Poor R wave progression. Low voltage. Stress EKG was non diagnostic for ischemia. No ST-T changes of ischemia noted with pharmacologic stress testing. Stress symptoms included shortness of breath. Stress terminated due to completion of protocol. 2. There was mild uptake artifact in the inferior wall.   No significant lung uptake or transient ischemic dilatation of the left ventricle in post stress images. The perfusion study demonstrated normal isotope uptake both at rest and stress. There was no evidence of ischemia or scar. Dynamic gated images reveal normal wall motion and endocardial thickening. Left ventricular ejection fraction was estimated to be 28%.  The ejection fraction may be an error due to difficulty with ECG gating due to atrial fibrillation.  This  represents a low risk study.  Clinical correlation is recommended.    Assessment/Plan:  1. Acute on chronic diastolic HF  2. Chronic kidney disease stage IV  3. DM  4. Morbid obesity.  5. A. Fib S/P cardioversion 03/03/2014. On chronic anticoagulation. 6. History of bronchial asthma Recommendation: I have requested nephrology consultation. I suspect her shortness of breath is due to acute exacerbation in bronchial asthma superimposed upon acute on chronic diastolic heart failure and also serum creatinine has changed this morning,  suspect acute renal failure on chronic renal failure. I would like nephrology input regarding diuretics. I have discontinued aspirin and Plavix as patient is on Eliquis for A. FIb.Marland Kitchen   Laverda Page, M.D. 03/04/2014, 9:15 AM [facility] Pager: [phone] Office: [phone] If no answer: [phone]

## 2014-03-04 NOTE — Progress Notes (Signed)
Pt. Refuses to wear CPAP tonight. Pt. Was made aware to let RT or RN know anytime during the night if she changed her mind. RN is aware.

## 2014-03-04 NOTE — Progress Notes (Signed)
While giving patient a breathing treatment her pulse increased to 157. RT discontinued treatment and notified  RN.

## 2014-03-04 NOTE — Progress Notes (Signed)
eLink Physician-Brief Progress Note Patient Name: [name] DOB: December 23, 1945 MRN: [id number]  Date of Service  03/04/2014   HPI/Events of Note  AFRVR. Rates 145-165/min. Dilt gtt @ 15 mg/hr   eICU Interventions  Dilt bolus 10 mg Increase upper limit of infusion to 20 mg/hr DC amlodipine while on diltiazem   Intervention Category Major Interventions: Arrhythmia - evaluation and management  Merton Border 03/04/2014, 11:23 PM

## 2014-03-04 NOTE — Consult Note (Addendum)
CARDIOLOGY CONSULT NOTE  Patient ID: [name] MRN: [id number] DOB/AGE: March 08, 1946 67 y.o.  Admit date: 03/03/2014 Referring Physician  [place] Primary Physician:  [name], MD Reason for Consultation  CHF  HPI: Patient is a 68 year old African American female with long-standing history of hypertension, hyperlipidemia, bronchial asthma, diabetes mellitus with renal manifestation, recent onset of atrial fibrillation. She also has morbid obesity. She was admitted via emergency room complaining of chest pain and shortness of breath.  She underwent successful cardioversion from new-onset atrial fibrillation to sinus rhythm cardiac this morning. She had been complaining about shortness of breath ongoing for the past one month. She had also been complaining of marked fatigue.  She has chest pain since cardioversion, described as dull ache in the middle of the chest, continuous. Worsens with taking deep breath.  She has been noticing worsening dyspnea over the past 3-4 weeks, got acutely short of breath this afternoon when she activated the EMS. Presently feels poorly, states that she has no energy. No palpitations, no dizziness or syncope. Denies any leg edema, painful swelling of the lower extremities. She has no bleeding diathesis.  She has chronic kidney disease and follows Dr. Erling Cruz.  Past Medical History  Diagnosis Date  . Atrial flutter   . Hypertension   . Diabetes mellitus   . Diastolic heart failure   . Asthma   . Hyperlipidemia   . Fatty liver   . Esophageal dysmotility   . Arthritis   . Congestive heart failure   . Sleep apnea     wears CPAP  . Family history of malignant neoplasm of gastrointestinal tract   . Fatty tumor fatty tumor back  . Coronary atherosclerosis of native coronary artery   . Morbid obesity   . Myocardial infarction 2009  . Dysrhythmia     afib,flutter hx  . Heart murmur   . Peripheral vascular disease   . Shortness of breath   .  Pneumonia     hx  . GERD (gastroesophageal reflux disease)     barrets esophagus  . Anginal pain     occ; non-ischemic Lexiscan 09/2012  . Kidney disease     CKD stage IV (Dr. Erling Cruz)     Past Surgical History  Procedure Laterality Date  . Coronary angioplasty with stent placement    . Breast lumpectomy      right  . Tubal ligation    . Tonsillectomy    . Total knee arthroplasty Right 12/15/2013    Procedure: RIGHT TOTAL KNEE ARTHROPLASTY;  Surgeon: Alta Corning, MD;  Location: [place];  Service: Orthopedics;  Laterality: Right;  . Cardioversion N/A 03/03/2014    Procedure: CARDIOVERSION;  Surgeon: Laverda Page, MD;  [facility];  Service: Cardiovascular;  Laterality: N/A;     Family History  Problem Relation Age of Onset  . Heart disease Mother   . Cancer Mother     bladder  . Kidney disease Mother   . Ovarian cancer Daughter   . Stomach cancer Maternal Uncle   . Colon cancer Maternal Aunt   . Esophageal cancer Neg Hx      Social History: History   Social History  . Marital Status: Widowed    Spouse Name: N/A    Number of Children: 3  . Years of Education: N/A   Occupational History  . Not on file.   Social History Main Topics  . Smoking status: Never Smoker   . Smokeless tobacco: Never Used  .  Alcohol Use: No  . Drug Use: No  . Sexual Activity: Not Currently   Other Topics Concern  . Not on file   Social History Narrative  . No narrative on file     Prescriptions prior to admission  Medication Sig Dispense Refill  . acetaminophen (TYLENOL) 500 MG tablet Take 500 mg by mouth every 6 (six) hours as needed for mild pain.      Marland Kitchen albuterol (PROVENTIL HFA;VENTOLIN HFA) 108 (90 BASE) MCG/ACT inhaler Inhale 2 puffs into the lungs every 4 (four) hours as needed for wheezing.       Marland Kitchen amitriptyline (ELAVIL) 25 MG tablet Take 25 mg by mouth at bedtime.        Marland Kitchen amLODipine (NORVASC) 10 MG tablet Take 10 mg by mouth daily.      Marland Kitchen apixaban  (ELIQUIS) 5 MG TABS tablet Take 5 mg by mouth 2 (two) times daily.      . cloNIDine (CATAPRES) 0.3 MG tablet Take 0.3 mg by mouth 2 (two) times daily.        . clopidogrel (PLAVIX) 75 MG tablet Take 75 mg by mouth daily.       Marland Kitchen dicyclomine (BENTYL) 10 MG capsule Take 10 mg by mouth 4 (four) times daily as needed (IBS).      Marland Kitchen esomeprazole (NEXIUM) 40 MG capsule Take 40 mg by mouth daily at 12 noon.      . febuxostat (ULORIC) 40 MG tablet Take 40 mg by mouth daily.       . insulin glargine (LANTUS) 100 UNIT/ML injection Inject 0.4 mLs (40 Units total) into the skin daily.  10 mL  11  . insulin lispro (HUMALOG) 100 UNIT/ML injection Inject 0.2 mLs (20 Units total) into the skin 3 (three) times daily after meals.  10 mL  11  . Linaclotide (LINZESS) 145 MCG CAPS capsule Take 290 mcg by mouth daily.      . methocarbamol (ROBAXIN-750) 750 MG tablet Take 1 tablet (750 mg total) by mouth every 8 (eight) hours as needed for muscle spasms.  40 tablet  0  . metoprolol (LOPRESSOR) 50 MG tablet Take 50 mg by mouth 2 (two) times daily.      . nitroGLYCERIN (NITROSTAT) 0.4 MG SL tablet Place 0.4 mg under the tongue every 5 (five) minutes as needed for chest pain.       Marland Kitchen oxyCODONE-acetaminophen (PERCOCET/ROXICET) 5-325 MG per tablet Take 1-2 tablets by mouth every 6 (six) hours as needed for severe pain.  30 tablet  0  . potassium chloride SA (K-DUR,KLOR-CON) 20 MEQ tablet Take 20 mEq by mouth 2 (two) times daily.       . ranitidine (ZANTAC) 150 MG tablet Take 1 tablet (150 mg total) by mouth at bedtime.  30 tablet  11  . rosuvastatin (CRESTOR) 10 MG tablet Take 10 mg by mouth daily.        Marland Kitchen torsemide (DEMADEX) 20 MG tablet Take 40 mg by mouth 2 (two) times daily.      . Vitamin D, Ergocalciferol, (DRISDOL) 50000 UNITS CAPS capsule Take 50,000 Units by mouth every Monday.      . zolpidem (AMBIEN) 10 MG tablet Take 10 mg by mouth at bedtime as needed for sleep.         Scheduled Meds: . amitriptyline  25 mg  Oral QHS  . amLODipine  10 mg Oral Daily  . antiseptic oral rinse  15 mL Mouth Rinse BID  . apixaban  5 mg Oral BID  . aspirin EC  81 mg Oral Daily  . atorvastatin  20 mg Oral q1800  . ceFEPime (MAXIPIME) IV  1 g Intravenous QHS  . cloNIDine  0.3 mg Oral BID  . clopidogrel  75 mg Oral Daily  . famotidine  20 mg Oral Daily  . febuxostat  40 mg Oral Daily  . furosemide  80 mg Intravenous Q6H  . insulin aspart  0-9 Units Subcutaneous TID WC  . insulin aspart  20 Units Subcutaneous TID PC  . insulin glargine  40 Units Subcutaneous Daily  . Linaclotide  290 mcg Oral Daily  . metoprolol  50 mg Oral BID  . nitroGLYCERIN  0.4 mg Transdermal Daily  . pantoprazole  40 mg Oral Daily  . potassium chloride SA  20 mEq Oral BID  . sodium chloride  3 mL Intravenous Q12H  . [START ON 03/05/2014] vancomycin  1,000 mg Intravenous Q24H   Continuous Infusions:  PRN Meds:.acetaminophen, acetaminophen, albuterol, dicyclomine, methocarbamol, morphine injection, ondansetron (ZOFRAN) IV, ondansetron, oxyCODONE-acetaminophen, zolpidem  ROS: Please see H&P I, she also of complaints of cough, sometimes productive. She still has persistent hip arthritis and knee arthritis, states that she has not recuperated well after her recent knee surgery and knee replacement. No recent weight changes. DM is controlled per history.    Physical Exam: Blood pressure 129/79, pulse 68, temperature 98.3 F (36.8 C), temperature source Oral, resp. rate 23, height 5\' 4"  (1.626 m), weight 86.8 kg (191 lb 5.8 oz), SpO2 96.00%.   General appearance: alert, cooperative, appears stated age, mild distress and morbidly obese Lungs: Bilateral diffuse extensive expiratory wheezes and crackles heard. Chest wall: Precordial tenderness present. Heart: S1, S2 normal, S4 present and No murmur appreciated. Abdomen: soft, non-tender; bowel sounds normal; no masses,  no organomegaly and Pannus present Extremities: extremities normal, atraumatic,  no cyanosis or edema Pulses: Carotids normal, femoral artery pulse distal, popliteal pulse distal. Pedal pulse unable to be felt. No sense of acute arterial insufficiency. Skin: Skin color, texture, turgor normal. No rashes or lesions Neurologic: Grossly normal  Labs:   Lab Results  Component Value Date   WBC 9.8 03/03/2014   HGB 13.0 03/03/2014   HCT 40.2 03/03/2014   MCV 93.7 03/03/2014   PLT 234 03/03/2014    Recent Labs Lab 03/04/14 0338  NA 137  K 4.4  CL 102  CO2 19  BUN 19  CREATININE 2.14*  CALCIUM 8.7  PROT 6.7  BILITOT 0.5  ALKPHOS 87  ALT 7  AST 14  GLUCOSE 217*   Lab Results  Component Value Date   CKTOTAL 413* 12/18/2008   CKMB 3.3 12/18/2008   TROPONINI <0.30 03/04/2014    Lipid Panel  No results found for this basename: chol, trig, hdl, cholhdl, vldl, ldlcalc    EKG: 03/03/2014 at 5:55 PM: Sinus rhythm with borderline first degree AV block, left ventricular hypertrophy with repolarization of normality, cannot exclude lateral ischemia. Recurrent PACs.    Radiology: Dg Chest Portable 1 View  03/03/2014   CLINICAL DATA:  Shortness of breath with chest pain for 1 day.  EXAM: PORTABLE CHEST - 1 VIEW  COMPARISON:  01/15/2014 and 01/06/2014.  FINDINGS: 1832 hr. There are lower lung volumes. The heart size and mediastinal contours are stable. There are increased interstitial markings throughout the lungs associated with poor definition of the pulmonary vasculature, most consistent with pulmonary edema. There is no confluent airspace opacity or significant pleural effusion. There is no  evidence of pneumothorax or acute osseous abnormality. Telemetry leads overlie the chest.  IMPRESSION: New diffuse interstitial prominence most consistent with acute congestive heart failure. Atypical infection is considered less likely.   Electronically Signed   By: Camie Patience M.D.   On: 03/03/2014 18:48    ASSESSMENT AND PLAN:  1. Acute on chronic diastolic HF 2. [place] kidney  disease stage IV  3. DM 4. Morbid obesity. 5. A. Fib S/P cardioversion 03/03/2014. On chronic anticoagulation.  Rec: Agree with high dose diuretics. I suspect HFPEF, renal failure and probably super imposed sepsis/pneumonia(low probabality). She has low grade temp and also cough. I will call in for renal consult. If no improvement in CHF, change to Demadex. On appropriate medical therapy otherwise. Discontinue Plavix and ASA to reduce bleeding  Laverda Page, MD 03/04/2014, 7:52 AM [facility] [place] Pager: [phone] Office: [phone] If no answer Cell [phone]

## 2014-03-04 NOTE — Progress Notes (Signed)
[facility] PROGRESS NOTE  [name] [id number] DOB: 06-29-1946 DOA: 03/03/2014 PCP: [name], MD  Assessment/Plan: 1. Acute hypoxic respiratory failure  -suspect aspiration event following CV yesterday, will repeat CXR given fever -also has acute on chornic diastolic CHF -Vanc/cefepime -IV lasix -I/Os -weights  2.  Atrial fibrillation status post cardioversion 6/30 - presently in sinus rhythm - continue apixaban, metoprolol  3. Chronic kidney disease stage IV - patient is followed by Dr. Bjorn Pippin - Closely follow intake output and metabolic panel - creatinine improved from baseline  4. CAD status post stenting - cycle cardiac markers.  -on plavix, metoprolol -will d/w Dr.Ganji regarding plavix and apixaban  5. OSA - CPAP per respiratory.   6. Diabetes mellitus -  -continue lantus, SSI  7. Hypertension  -BP stable, will cut down clonidine today   DVt proph: on apixaban  Code Status:Full COde Family Communication: none at bedside Disposition Plan: keep in SDU today   Consultants:  Cards  Antibiotics:  vanc/cefepime  HPI/Subjective: C/o dry cough  Objective: Filed Vitals:   03/04/14 0800  BP: 147/100  Pulse: 75  Temp:   Resp: 25    Intake/Output Summary (Last 24 hours) at 03/04/14 1051 Last data filed at 03/04/14 0800  Gross per 24 hour  Intake    750 ml  Output   1550 ml  Net   -800 ml   Filed Weights   03/03/14 1754 03/04/14 0500  Weight: 104.327 kg (230 lb) 86.8 kg (191 lb 5.8 oz)    Exam:   General:  AAOx3, no distress  Cardiovascular: S1S2/RRR  Respiratory: scattered ronchi at bases  Abdomen: soft, Nt, BS present  Musculoskeletal: trace edema   Data Reviewed: Basic Metabolic Panel:  Recent Labs Lab 03/03/14 0908 03/03/14 1851 03/04/14 0338  NA 143 142 137  K 3.8 4.0 4.4  CL  --  104 102  CO2  --  21 19  GLUCOSE 81 165* 217*  BUN  --  18 19  CREATININE  --  2.14* 2.14*  CALCIUM  --  9.4 8.7    Liver Function Tests:  Recent Labs Lab 03/04/14 0338  AST 14  ALT 7  ALKPHOS 87  BILITOT 0.5  PROT 6.7  ALBUMIN 2.9*   No results found for this basename: LIPASE, AMYLASE,  in the last 168 hours No results found for this basename: AMMONIA,  in the last 168 hours CBC:  Recent Labs Lab 03/03/14 0908 03/03/14 1851 03/04/14 0855  WBC  --  9.8 9.9  NEUTROABS  --   --  7.8*  HGB 12.6 13.0 12.7  HCT 37.0 40.2 39.7  MCV  --  93.7 94.3  PLT  --  234 225   Cardiac Enzymes:  Recent Labs Lab 03/03/14 1851 03/03/14 2255 03/04/14 0338  TROPONINI <0.30 <0.30 <0.30   BNP (last 3 results)  Recent Labs  03/03/14 1851  PROBNP 1531.0*   CBG:  Recent Labs Lab 03/03/14 0929 03/03/14 1034 03/03/14 2216 03/04/14 0824  GLUCAP 74 105* 178* 160*    Recent Results (from the past 240 hour(s))  MRSA PCR SCREENING     Status: None   Collection Time    03/03/14  9:57 PM      Result Value Ref Range Status   MRSA by PCR NEGATIVE  NEGATIVE Final   Comment:            The GeneXpert MRSA Assay (FDA     approved for NASAL specimens  only), is one component of a     comprehensive MRSA colonization     surveillance program. It is not     intended to diagnose MRSA     infection nor to guide or     monitor treatment for     MRSA infections.     Studies: Dg Chest 2 View  03/04/2014   CLINICAL DATA:  Shortness of breath, fever.  EXAM: CHEST  2 VIEW  COMPARISON:  March 03, 2014.  FINDINGS: Stable cardiomediastinal silhouette. New airspace opacity is noted in the right upper lobe, as well as larger airspace opacity seen in the left upper lobe. These findings are most consistent with pneumonia. No significant pleural effusion or pneumothorax is noted. Bony thorax appears intact.  IMPRESSION: Interval development of bilateral upper lobe airspace opacities are noted consistent with pneumonia.   Electronically Signed   By: Sabino Dick M.D.   On: 03/04/2014 09:43   Dg Chest Portable 1  View  03/03/2014   CLINICAL DATA:  Shortness of breath with chest pain for 1 day.  EXAM: PORTABLE CHEST - 1 VIEW  COMPARISON:  01/15/2014 and 01/06/2014.  FINDINGS: 1832 hr. There are lower lung volumes. The heart size and mediastinal contours are stable. There are increased interstitial markings throughout the lungs associated with poor definition of the pulmonary vasculature, most consistent with pulmonary edema. There is no confluent airspace opacity or significant pleural effusion. There is no evidence of pneumothorax or acute osseous abnormality. Telemetry leads overlie the chest.  IMPRESSION: New diffuse interstitial prominence most consistent with acute congestive heart failure. Atypical infection is considered less likely.   Electronically Signed   By: Camie Patience M.D.   On: 03/03/2014 18:48    Scheduled Meds: . amitriptyline  25 mg Oral QHS  . amLODipine  10 mg Oral Daily  . antiseptic oral rinse  15 mL Mouth Rinse BID  . apixaban  5 mg Oral BID  . atorvastatin  20 mg Oral q1800  . benzonatate  100 mg Oral TID  . ceFEPime (MAXIPIME) IV  1 g Intravenous QHS  . cloNIDine  0.1 mg Oral BID  . famotidine  20 mg Oral Daily  . febuxostat  40 mg Oral Daily  . furosemide  80 mg Intravenous Q6H  . insulin aspart  0-9 Units Subcutaneous TID WC  . insulin aspart  20 Units Subcutaneous TID PC  . insulin glargine  40 Units Subcutaneous Daily  . Linaclotide  290 mcg Oral Daily  . metoprolol  50 mg Oral BID  . nitroGLYCERIN  0.4 mg Transdermal Daily  . pantoprazole  40 mg Oral Daily  . potassium chloride SA  20 mEq Oral BID  . sodium chloride  3 mL Intravenous Q12H  . [START ON 03/05/2014] vancomycin  1,000 mg Intravenous Q24H   Continuous Infusions:  Antibiotics Given (last 72 hours)   Date/Time Action Medication Dose Rate   03/03/14 2251 Given   levofloxacin (LEVAQUIN) IVPB 750 mg 750 mg 100 mL/hr   03/04/14 0103 Given   ceFEPIme (MAXIPIME) 1 g in dextrose 5 % 50 mL IVPB 1 g 100 mL/hr    03/04/14 0159 Given   vancomycin (VANCOCIN) 2,000 mg in sodium chloride 0.9 % 500 mL IVPB 2,000 mg 250 mL/hr      Principal Problem:   Acute respiratory failure Active Problems:   Obstructive sleep apnea   Acute CHF   Chest pain    Time spent: 51min    [name]  [facility] Pager [phone]. If 7PM-7AM, please contact night-coverage at [email or web address], password [facility] 03/04/2014, 10:51 AM  LOS: 1 day

## 2014-03-04 NOTE — Progress Notes (Signed)
Desats to 84%, slight sob and wheezing. Mn tx  given by RT, o2 40 %vm in used -pulse ox - 92 %. Continue to monitor.

## 2014-03-04 NOTE — Progress Notes (Signed)
Foley huddle done with charge nurse and myself about placing a foley on this pt. Upon assessment pt was very short of breath and could not lay completely flat at all without O2 sats dropping and complaining she could not breath. She is receiving 80mg  Lasix BID. I also spoke with the MD about the situation and we all felt it was best. Both the charge nurse and myself felt that the pt would benefit from the foley for the time being. Peri care was done prior to insertion of the foley and proper sterile technique was used. Will continue to assess and monitor for an appropriate time to remove the foley.

## 2014-03-04 NOTE — Progress Notes (Signed)
Daughter  requested for Dr. Haroldine Laws  to be consulted, Dr. Einar Gip made aware who claimed to be ok. NP  With Dr Haroldine Laws called and claimed  to have the consultation after Dr. Einar Gip calls him. Daughter made aware.

## 2014-03-05 ENCOUNTER — Inpatient Hospital Stay (HOSPITAL_COMMUNITY): Payer: Medicare HMO

## 2014-03-05 DIAGNOSIS — J690 Pneumonitis due to inhalation of food and vomit: Secondary | ICD-10-CM | POA: Diagnosis present

## 2014-03-05 DIAGNOSIS — I5031 Acute diastolic (congestive) heart failure: Secondary | ICD-10-CM

## 2014-03-05 DIAGNOSIS — I4891 Unspecified atrial fibrillation: Secondary | ICD-10-CM | POA: Diagnosis present

## 2014-03-05 DIAGNOSIS — K219 Gastro-esophageal reflux disease without esophagitis: Secondary | ICD-10-CM

## 2014-03-05 LAB — CBC
HCT: 38 % (ref 36.0–46.0)
HCT: 36.4 % (ref 36.0–46.0)
Hemoglobin: 12.1 g/dL (ref 12.0–15.0)
Hemoglobin: 12 g/dL (ref 12.0–15.0)
MCH: 29.7 pg (ref 26.0–34.0)
MCH: 30.2 pg (ref 26.0–34.0)
MCHC: 31.8 g/dL (ref 30.0–36.0)
MCHC: 33 g/dL (ref 30.0–36.0)
MCV: 93.4 fL (ref 78.0–100.0)
MCV: 91.5 fL (ref 78.0–100.0)
Platelets: 245 10*3/uL (ref 150–400)
Platelets: 267 10*3/uL (ref 150–400)
RBC: 4.07 MIL/uL (ref 3.87–5.11)
RBC: 3.98 MIL/uL (ref 3.87–5.11)
RDW: 15.1 % (ref 11.5–15.5)
RDW: 15.1 % (ref 11.5–15.5)
WBC: 13.2 10*3/uL — ABNORMAL HIGH (ref 4.0–10.5)
WBC: 15.8 10*3/uL — ABNORMAL HIGH (ref 4.0–10.5)

## 2014-03-05 LAB — BASIC METABOLIC PANEL
Anion gap: 18 — ABNORMAL HIGH (ref 5–15)
BUN: 21 mg/dL (ref 6–23)
CO2: 22 mEq/L (ref 19–32)
Calcium: 9.1 mg/dL (ref 8.4–10.5)
Chloride: 99 mEq/L (ref 96–112)
Creatinine, Ser: 2.38 mg/dL — ABNORMAL HIGH (ref 0.50–1.10)
GFR calc Af Amer: 23 mL/min — ABNORMAL LOW (ref >90)
GFR calc non Af Amer: 20 mL/min — ABNORMAL LOW (ref >90)
Glucose, Bld: 262 mg/dL — ABNORMAL HIGH (ref 70–99)
Potassium: 3.7 mEq/L (ref 3.7–5.3)
Sodium: 139 mEq/L (ref 137–147)

## 2014-03-05 LAB — LACTIC ACID, PLASMA: Lactic Acid, Venous: 1.6 mmol/L (ref 0.5–2.2)

## 2014-03-05 LAB — POCT I-STAT 3, ART BLOOD GAS (G3+)
Acid-Base Excess: 1 mmol/L (ref 0.0–2.0)
Bicarbonate: 23.7 mEq/L (ref 20.0–24.0)
O2 Saturation: 88 %
Patient temperature: 101.6
TCO2: 25 mmol/L (ref 0–100)
pCO2 arterial: 33.2 mmHg — ABNORMAL LOW (ref 35.0–45.0)
pH, Arterial: 7.468 — ABNORMAL HIGH (ref 7.350–7.450)
pO2, Arterial: 56 mmHg — ABNORMAL LOW (ref 80.0–100.0)

## 2014-03-05 LAB — GLUCOSE, CAPILLARY
Glucose-Capillary: 229 mg/dL — ABNORMAL HIGH (ref 70–99)
Glucose-Capillary: 267 mg/dL — ABNORMAL HIGH (ref 70–99)
Glucose-Capillary: 245 mg/dL — ABNORMAL HIGH (ref 70–99)
Glucose-Capillary: 223 mg/dL — ABNORMAL HIGH (ref 70–99)
Glucose-Capillary: 243 mg/dL — ABNORMAL HIGH (ref 70–99)

## 2014-03-05 LAB — MAGNESIUM: Magnesium: 2.1 mg/dL (ref 1.5–2.5)

## 2014-03-05 LAB — PROTIME-INR
INR: 2.01 — ABNORMAL HIGH (ref 0.00–1.49)
Prothrombin Time: 22.8 seconds — ABNORMAL HIGH (ref 11.6–15.2)

## 2014-03-05 LAB — PHOSPHORUS: Phosphorus: 1.5 mg/dL — ABNORMAL LOW (ref 2.3–4.6)

## 2014-03-05 LAB — PROCALCITONIN: Procalcitonin: 0.11 ng/mL

## 2014-03-05 MED ORDER — CLONIDINE HCL 0.1 MG PO TABS
0.1000 mg | ORAL_TABLET | ORAL | Status: DC
Start: 2014-03-05 — End: 2014-03-05

## 2014-03-05 MED ORDER — FUROSEMIDE 10 MG/ML IJ SOLN
80.0000 mg | Freq: Two times a day (BID) | INTRAMUSCULAR | Status: DC
  Administered 2014-03-06: 80 mg via INTRAVENOUS
  Filled 2014-03-05 (×3): qty 8

## 2014-03-05 MED ORDER — SODIUM CHLORIDE 0.9 % IV SOLN
INTRAVENOUS | Status: DC
  Administered 2014-03-05: 20:00:00 via INTRAVENOUS

## 2014-03-05 MED ORDER — METOPROLOL TARTRATE 1 MG/ML IV SOLN
2.5000 mg | INTRAVENOUS | Status: DC | PRN
  Administered 2014-03-05: 2.5 mg via INTRAVENOUS
  Filled 2014-03-05: qty 5

## 2014-03-05 MED ORDER — "THROMBI-PAD 3""X3"" EX PADS"
1.0000 | MEDICATED_PAD | Freq: Once | CUTANEOUS | Status: AC
  Administered 2014-03-05: 1 via TOPICAL
  Filled 2014-03-05: qty 1

## 2014-03-05 MED ORDER — INSULIN GLARGINE 100 UNIT/ML ~~LOC~~ SOLN
45.0000 [IU] | Freq: Every day | SUBCUTANEOUS | Status: DC
  Administered 2014-03-05 – 2014-03-13 (×9): 45 [IU] via SUBCUTANEOUS
  Filled 2014-03-05 (×9): qty 0.45

## 2014-03-05 MED ORDER — HYDRALAZINE HCL 20 MG/ML IJ SOLN
10.0000 mg | Freq: Once | INTRAMUSCULAR | Status: AC
  Administered 2014-03-05: 15:00:00 via INTRAVENOUS

## 2014-03-05 MED ORDER — MIDAZOLAM HCL 2 MG/2ML IJ SOLN
INTRAMUSCULAR | Status: AC
  Filled 2014-03-05: qty 2

## 2014-03-05 MED ORDER — FENTANYL CITRATE 0.05 MG/ML IJ SOLN
25.0000 ug | Freq: Once | INTRAMUSCULAR | Status: AC
  Administered 2014-03-05: 25 ug via INTRAVENOUS

## 2014-03-05 MED ORDER — PIPERACILLIN-TAZOBACTAM IN DEX 2-0.25 GM/50ML IV SOLN
2.2500 g | Freq: Four times a day (QID) | INTRAVENOUS | Status: DC
  Administered 2014-03-05 – 2014-03-06 (×3): 2.25 g via INTRAVENOUS
  Filled 2014-03-05 (×5): qty 50

## 2014-03-05 MED ORDER — PANTOPRAZOLE SODIUM 40 MG IV SOLR
40.0000 mg | Freq: Two times a day (BID) | INTRAVENOUS | Status: DC
  Administered 2014-03-05: 40 mg via INTRAVENOUS
  Filled 2014-03-05 (×2): qty 40

## 2014-03-05 MED ORDER — FENTANYL CITRATE 0.05 MG/ML IJ SOLN
INTRAMUSCULAR | Status: AC
  Filled 2014-03-05: qty 2

## 2014-03-05 MED ORDER — MIDAZOLAM HCL 2 MG/2ML IJ SOLN: 1.0000 mg | Freq: Once | INTRAMUSCULAR | Status: AC

## 2014-03-05 MED ORDER — CLONIDINE HCL 0.1 MG PO TABS
0.1000 mg | ORAL_TABLET | Freq: Three times a day (TID) | ORAL | Status: DC
  Administered 2014-03-05 – 2014-03-13 (×23): 0.1 mg via ORAL
  Filled 2014-03-05 (×28): qty 1

## 2014-03-05 NOTE — Progress Notes (Signed)
eLink Physician-Brief Progress Note Patient Name: [name] DOB: [birth year] MRN: [id number]  Date of Service  03/05/2014   HPI/Events of Note   Still oozing from CVL site.  eICU Interventions  Will hold eliquis and have bedside team assess.   Intervention Category Major Interventions: Other:  Vilda Zollner 03/05/2014, 10:00 PM

## 2014-03-05 NOTE — Progress Notes (Signed)
Blood pressure remain elevated Amy Clegg Np notified. MD making rounds.

## 2014-03-05 NOTE — Progress Notes (Signed)
Pharmacy Consult -- > Cefepime to Zosyn  Aspiration Pneumonia CrCl=25 ml/min  Plan: 1) DC cefepime 2) Zosyn 2.25 grams iv Q 6 hours 3) Continue to follow  Thank you. Anette Guarneri, PharmD

## 2014-03-05 NOTE — Progress Notes (Signed)
PCCM General Progress Note  Event:  Asked by RN to assess CVL site which has been bleeding since CVL was inserted.  Intervention:  Dressing removed.  Site cleaned with chlora-prep x 2.  1 simple interrupted suture placed around incision site, bleeding appeared to have slowed down.  New bio-patch applied and new dressing applied.  Instructions given to RN that if site continues to bleed, apply thrombi pads.   Montey Hora, [facility] Pgr: [phone]  or [phone]

## 2014-03-05 NOTE — Progress Notes (Addendum)
Advanced Heart Failure Rounding Note   Subjective:   [name] is a 68 year old woman with hypertension, hyperlipidemia, bronchial asthma, diabetes mellitus, CKD stage IV (baseline cr [id number]) and diastolic HF. Her and her family have requested that Dr. Einar Gip contact the HF team for a HF evaluation.   She was admitted in 4/15 for R TKR. That hospitalization complicated by a/c diastolic HF with respiratory distress requiring non-rebreather support. Weight on discharge was 226 pounds. ABG at that time 7.4/34/91/97%. Cr peaked at 2.9.   She states that since going home she has not felt well. Has remained sob, weak and has had pbroblems with edema and weight got as high as 238. Dr. Einar Gip saw her and found her to have new AF. She was started on Eliquis and on the morning of 6/30 she underwent elect DC-CV of AF with Dr. Einar Gip. That evening she presented to the emergency room complaining of chest pain, shortness of breath and cough. CXR showed pulmonary edema. ECG with SR with PACs. Reportedly with diffuse wheezing on exam.   She continues to diurese with IV lasix. Febrile again over night - 101.7. Last night she was back into A fib. She was also placed on cardizem drip and amiodarone drip. Weight down 1 pound. Last night she refused CPAP. Today much more uncomfortable and SOB. Says she hurts all over.   Increased dysnea at rest.   Objective:   Weight Range:  Vital Signs:   Temp:  [98.5 F (36.9 C)-99.8 F (37.7 C)] 99.4 F (37.4 C) (07/02 0800) Pulse Rate:  [78-167] 118 (07/02 0700) Resp:  [15-43] 22 (07/02 0800) BP: (96-184)/(49-120) 166/88 mmHg (07/02 0800) SpO2:  [90 %-98 %] 95 % (07/02 0852) FiO2 (%):  [40 %] 40 % (07/02 0852) Weight:  [190 lb 7.6 oz (86.4 kg)] 190 lb 7.6 oz (86.4 kg) (07/02 0330)    Weight change: Filed Weights   03/03/14 1754 03/04/14 0500 03/05/14 0330  Weight: 230 lb (104.327 kg) 191 lb 5.8 oz (86.8 kg) 190 lb 7.6 oz (86.4 kg)    Intake/Output:   Intake/Output  Summary (Last 24 hours) at 03/05/14 0921 Last data filed at 03/05/14 [id number]  Gross per 24 hour  Intake 674.74 ml  Output   3650 ml  Net -2975.26 ml    Physical Exam:  General: Obese woman lying in bed on face mask . Moaning in pain and tachypneic HEENT: normal  Neck: supple. JVP hard to see but looks to be elevated to jaw. Carotids 2+ bilat; no bruits. No lymphadenopathy or thryomegaly appreciated.  Cor: PMI nonpalpbale. Distant. Irregular. No obvious murmur  Lungs: tachypneic. clear Abdomen: Obese soft, nontender,. No hepatosplenomegaly. No bruits or masses. Good bowel sounds.  Extremities: no cyanosis, clubbing, rash, warm. No edema Neuro: alert & orientedx3, cranial nerves grossly intact. moves all 4 extremities w/o difficulty. Affect pleasant    Telemetry: A fib 115  Labs: Basic Metabolic Panel:  Recent Labs Lab 03/03/14 0908 03/03/14 1851 03/04/14 0338 03/05/14 0251  NA 143 142 137 139  K 3.8 4.0 4.4 3.7  CL  --  104 102 99  CO2  --  21 19 22   GLUCOSE 81 165* 217* 262*  BUN  --  18 19 21   CREATININE  --  2.14* 2.14* 2.38*  CALCIUM  --  9.4 8.7 9.1    Liver Function Tests:  Recent Labs Lab 03/04/14 0338  AST 14  ALT 7  ALKPHOS 87  BILITOT 0.5  PROT 6.7  ALBUMIN 2.9*   No results found for this basename: LIPASE, AMYLASE,  in the last 168 hours No results found for this basename: AMMONIA,  in the last 168 hours  CBC:  Recent Labs Lab 03/03/14 0908 03/03/14 1851 03/04/14 0855 03/05/14 0251  WBC  --  9.8 9.9 13.2*  NEUTROABS  --   --  7.8*  --   HGB 12.6 13.0 12.7 12.1  HCT 37.0 40.2 39.7 38.0  MCV  --  93.7 94.3 93.4  PLT  --  234 225 245    Cardiac Enzymes:  Recent Labs Lab 03/03/14 1851 03/03/14 2255 03/04/14 0338 03/04/14 1040  TROPONINI <0.30 <0.30 <0.30 <0.30    BNP: BNP (last 3 results)  Recent Labs  03/03/14 1851  PROBNP 1531.0*     Other results:    Imaging: Dg Chest 2 View  03/04/2014   CLINICAL DATA:  Shortness  of breath, fever.  EXAM: CHEST  2 VIEW  COMPARISON:  March 03, 2014.  FINDINGS: Stable cardiomediastinal silhouette. New airspace opacity is noted in the right upper lobe, as well as larger airspace opacity seen in the left upper lobe. These findings are most consistent with pneumonia. No significant pleural effusion or pneumothorax is noted. Bony thorax appears intact.  IMPRESSION: Interval development of bilateral upper lobe airspace opacities are noted consistent with pneumonia.   Electronically Signed   By: Sabino Dick M.D.   On: 03/04/2014 09:43   Dg Chest Portable 1 View  03/03/2014   CLINICAL DATA:  Shortness of breath with chest pain for 1 day.  EXAM: PORTABLE CHEST - 1 VIEW  COMPARISON:  01/15/2014 and 01/06/2014.  FINDINGS: 1832 hr. There are lower lung volumes. The heart size and mediastinal contours are stable. There are increased interstitial markings throughout the lungs associated with poor definition of the pulmonary vasculature, most consistent with pulmonary edema. There is no confluent airspace opacity or significant pleural effusion. There is no evidence of pneumothorax or acute osseous abnormality. Telemetry leads overlie the chest.  IMPRESSION: New diffuse interstitial prominence most consistent with acute congestive heart failure. Atypical infection is considered less likely.   Electronically Signed   By: Camie Patience M.D.   On: 03/03/2014 18:48      Medications:     Scheduled Medications: . albuterol  2.5 mg Inhalation Q4H  . amitriptyline  25 mg Oral QHS  . antiseptic oral rinse  15 mL Mouth Rinse BID  . apixaban  5 mg Oral BID  . atorvastatin  20 mg Oral q1800  . benzonatate  100 mg Oral TID  . ceFEPime (MAXIPIME) IV  1 g Intravenous QHS  . cloNIDine  0.1 mg Oral 3 times per day  . diltiazem  10 mg Intravenous Once  . famotidine  20 mg Oral Daily  . febuxostat  40 mg Oral Daily  . feeding supplement (ENSURE COMPLETE)  237 mL Oral BID BM  . furosemide  80 mg  Intravenous 3 times per day  . insulin aspart  0-9 Units Subcutaneous TID WC  . insulin aspart  20 Units Subcutaneous TID PC  . insulin glargine  45 Units Subcutaneous Daily  . Linaclotide  290 mcg Oral Daily  . metoprolol  50 mg Oral BID  . pantoprazole  40 mg Oral Daily  . potassium chloride SA  20 mEq Oral BID  . sodium chloride  3 mL Intravenous Q12H  . vancomycin  1,000 mg Intravenous Q24H     Infusions: . amiodarone  30 mg/hr (03/05/14 [id number])  . diltiazem (CARDIZEM) infusion 20 mg/hr (03/05/14 0828)     PRN Medications:  acetaminophen, acetaminophen, dicyclomine, methocarbamol, morphine injection, ondansetron (ZOFRAN) IV, ondansetron, oxyCODONE-acetaminophen, zolpidem   Assessment:  1. Acute respiratory failure  2. A/c chronic diastolic HF  3. Probable PNA  4. Chronic renal failure, stage IV  5. Obesity  6. AF s/p DC-CV on 6/30  7. HTN  8. DM2     Plan/Discussion:     Length of Stay: 2  CLEGG,AMY 03/05/2014, 9:21 AM  Advanced Heart Failure Team Pager [phone] (M-F; 7a - 4p)  Please contact [facility] for night-coverage after hours (4p -7a ) and weekends on [email or web address]   Patient seen and examined with Darrick Grinder, NP. We discussed all aspects of the encounter. I agree with the assessment and plan as stated above.   She appears worse today. More uncomfortable and SOB. She has diuresed well. JVP still appears up but renal function getting worse so need to hold IV lasix (she is well below her baseline weight). I think main issue now may be infectious in nature - ? possible aspiration with DC-CV. Will repeat CXR. Get ABG and consult PCCM. May need to expand abx, Check Bcx.  She is back in AF after recent DC-CV. Agree with amio and diltiazem. Would not re-cardiovert at this time as she will likely just revert back to AF until resp status is improved.  Maclaine Ahola,MD 1:57 PM

## 2014-03-05 NOTE — Progress Notes (Signed)
S: Patient appears anxious, says she coughed up some blood, saw it in a cough, was very minimal brownish sputum, but mostly clear. Anxiety appears to be making her SOB. Says breathing treatments make her feel better.  O:BP 166/88  Pulse 118  Temp(Src) 99.4 F (37.4 C) (Oral)  Resp 22  Ht 5\' 4"  (1.626 m)  Wt 190 lb 7.6 oz (86.4 kg)  BMI 32.68 kg/m2  SpO2 95%  Intake/Output Summary (Last 24 hours) at 03/05/14 0944 Last data filed at 03/05/14 0643  Gross per 24 hour  Intake 674.74 ml  Output   3650 ml  Net -2975.26 ml   Intake/Output: I/O last 3 completed shifts: In: 1424.7 [P.O.:110; I.V.:364.7; IV Piggyback:950] Out: 5200 [Urine:5200]  Intake/Output this shift:    Weight change: -39 lb 8.4 oz (-17.927 kg)  Gen: Lying in bed, anxious, with nebulizer breathing treatment on going. HEENT: AT, [place], talking through breathing mask. CVS: Tachycardic and irregular. Resp: Expiratory wheezing breath sounds.  Abd: Distended, not normal for her, bowel sounds not heard, likely due to fluid present, fluid thrill absent. Ext: No pedal edema, warm and well perfused.  Recent Labs Lab 03/03/14 0908 03/03/14 1851 03/04/14 0338 03/05/14 0251  NA 143 142 137 139  K 3.8 4.0 4.4 3.7  CL  --  104 102 99  CO2  --  21 19 22   GLUCOSE 81 165* 217* 262*  BUN  --  18 19 21   CREATININE  --  2.14* 2.14* 2.38*  ALBUMIN  --   --  2.9*  --   CALCIUM  --  9.4 8.7 9.1  AST  --   --  14  --   ALT  --   --  7  --    Liver Function Tests:  Recent Labs Lab 03/04/14 0338  AST 14  ALT 7  ALKPHOS 87  BILITOT 0.5  PROT 6.7  ALBUMIN 2.9*   CBC:  Recent Labs Lab 03/03/14 1851 03/04/14 0855 03/05/14 0251  WBC 9.8 9.9 13.2*  NEUTROABS  --  7.8*  --   HGB 13.0 12.7 12.1  HCT 40.2 39.7 38.0  MCV 93.7 94.3 93.4  PLT 234 225 245   Cardiac Enzymes:  Recent Labs Lab 03/03/14 1851 03/03/14 2255 03/04/14 0338 03/04/14 1040  TROPONINI <0.30 <0.30 <0.30 <0.30   CBG:  Recent Labs Lab  03/04/14 0824 03/04/14 1201 03/04/14 1639 03/04/14 2150 03/05/14 0811  GLUCAP 160* 139* 95 229* 267*   Studies/Results: Dg Chest 2 View  03/04/2014   CLINICAL DATA:  Shortness of breath, fever.  EXAM: CHEST  2 VIEW  COMPARISON:  March 03, 2014.  FINDINGS: Stable cardiomediastinal silhouette. New airspace opacity is noted in the right upper lobe, as well as larger airspace opacity seen in the left upper lobe. These findings are most consistent with pneumonia. No significant pleural effusion or pneumothorax is noted. Bony thorax appears intact.  IMPRESSION: Interval development of bilateral upper lobe airspace opacities are noted consistent with pneumonia.   Electronically Signed   By: Sabino Dick M.D.   On: 03/04/2014 09:43   Dg Chest Portable 1 View  03/03/2014   CLINICAL DATA:  Shortness of breath with chest pain for 1 day.  EXAM: PORTABLE CHEST - 1 VIEW  COMPARISON:  01/15/2014 and 01/06/2014.  FINDINGS: 1832 hr. There are lower lung volumes. The heart size and mediastinal contours are stable. There are increased interstitial markings throughout the lungs associated with poor definition of the pulmonary vasculature,  most consistent with pulmonary edema. There is no confluent airspace opacity or significant pleural effusion. There is no evidence of pneumothorax or acute osseous abnormality. Telemetry leads overlie the chest.  IMPRESSION: New diffuse interstitial prominence most consistent with acute congestive heart failure. Atypical infection is considered less likely.   Electronically Signed   By: Camie Patience M.D.   On: 03/03/2014 18:48   . albuterol  2.5 mg Inhalation Q4H  . amitriptyline  25 mg Oral QHS  . antiseptic oral rinse  15 mL Mouth Rinse BID  . apixaban  5 mg Oral BID  . atorvastatin  20 mg Oral q1800  . benzonatate  100 mg Oral TID  . ceFEPime (MAXIPIME) IV  1 g Intravenous QHS  . cloNIDine  0.1 mg Oral 3 times per day  . diltiazem  10 mg Intravenous Once  . famotidine  20 mg  Oral Daily  . febuxostat  40 mg Oral Daily  . feeding supplement (ENSURE COMPLETE)  237 mL Oral BID BM  . furosemide  80 mg Intravenous 3 times per day  . insulin aspart  0-9 Units Subcutaneous TID WC  . insulin aspart  20 Units Subcutaneous TID PC  . insulin glargine  45 Units Subcutaneous Daily  . Linaclotide  290 mcg Oral Daily  . metoprolol  50 mg Oral BID  . pantoprazole  40 mg Oral Daily  . potassium chloride SA  20 mEq Oral BID  . sodium chloride  3 mL Intravenous Q12H  . vancomycin  1,000 mg Intravenous Q24H    BMET    Component Value Date/Time   NA 139 03/05/2014 0251   K 3.7 03/05/2014 0251   CL 99 03/05/2014 0251   CO2 22 03/05/2014 0251   GLUCOSE 262* 03/05/2014 0251   BUN 21 03/05/2014 0251   CREATININE 2.38* 03/05/2014 0251   CALCIUM 9.1 03/05/2014 0251   GFRNONAA 20* 03/05/2014 0251   GFRAA 23* 03/05/2014 0251   CBC    Component Value Date/Time   WBC 13.2* 03/05/2014 0251   RBC 4.07 03/05/2014 0251   HGB 12.1 03/05/2014 0251   HCT 38.0 03/05/2014 0251   PLT 245 03/05/2014 0251   MCV 93.4 03/05/2014 0251   MCH 29.7 03/05/2014 0251   MCHC 31.8 03/05/2014 0251   RDW 15.1 03/05/2014 0251   LYMPHSABS 1.1 03/04/2014 0855   MONOABS 0.8 03/04/2014 0855   EOSABS 0.2 03/04/2014 0855   BASOSABS 0.0 03/04/2014 0855     Assessment/Plan: 1.  Acute respiratory distress- Aetiology- PNA, Acute on chronic respiratory failure, and reactive airway disease- Asthma, wheezing still present. On antibiotics- Vanc and cefepime. Responding to IV lasix.  2. CKD4- Cr today-2.38 increased form 2.14 yesterday, improved compared to prior.- Cont to monitor.   3. DM- Per primary team.   4. HTN- Blood pressure WNL, management per Primary team.   5. Atria fib- Management per Cards.    Emokpae, Ejiroghene IMTS PGY-2  I have seen and examined this patient and agree with plan as outlined by Dr. Denton Brick.  Would decrease lasix to BID as volume does not really appear to be main issue with her resp status.  Will also need  better BP control and increase meds per Cardiology due to her recurrent A fib/ RVR. Carolyn Maniscalco A,MD 03/05/2014 11:38 AM

## 2014-03-05 NOTE — Progress Notes (Signed)
Utilization review completed. Duana Benedict, RN, BSN. 

## 2014-03-05 NOTE — Progress Notes (Signed)
Elink RN and MD notified of oozing at right IJ triple lumen insertion site.

## 2014-03-05 NOTE — Procedures (Deleted)
Central Venous Catheter Insertion Procedure Note SHERLITA MENCHACA [id number] Oct 24, 1945  Procedure: Insertion of Central Venous Catheter Indications: Assessment of intravascular volume, Drug and/or fluid administration and Frequent blood sampling  Procedure Details Consent: Risks of procedure as well as the alternatives and risks of each were explained to the (patient/caregiver).  Consent for procedure obtained. Time Out: Verified patient identification, verified procedure, site/side was marked, verified correct patient position, special equipment/implants available, medications/allergies/relevent history reviewed, required imaging and test results available.  Performed  Maximum sterile technique was used including antiseptics, cap, gloves, gown, hand hygiene, mask and sheet. Skin prep: Chlorhexidine; local anesthetic administered A antimicrobial bonded/coated triple lumen catheter was placed in the right internal jugular vein using the Seldinger technique.  Evaluation Blood flow good Complications: No apparent complications Patient did tolerate procedure well. Chest X-ray ordered to verify placement.  CXR: pending.  Tresa Garter ACNP Student 03/05/2014, 4:34 PM

## 2014-03-05 NOTE — Progress Notes (Addendum)
[facility] PROGRESS NOTE  [name] [id number] DOB: 08-15-1946 DOA: 03/03/2014 PCP: [name], MD  Assessment/Plan: 1. Acute hypoxic respiratory failure  -due to aspiration pneumonia, acute on chronic diastolic CHF -Continue Vanc/cefepime -IV lasix per Cards -I/Os, negative 3.7L -weight improving  2.  Atrial fibrillation with RVR overnight - status post cardioversion 6/30 - Started on Cardizem gtt overnight and subsequently Amiodarone this am, will defer to Cards - continue apixaban, metoprolol  3. Chronic kidney disease stage IV - patient is followed by Dr. Bjorn Pippin - Closely follow intake output and metabolic panel - creatinine improved from baseline - Renal following too  4. CAD status post stenting - cycle cardiac markers.  -on metoprolol, plavix stopped due to apixaban  5. OSA - CPAP per respiratory.   6. Diabetes mellitus -  -continue lantus increase dose, SSI  7. Hypertension  -BP stable, continue current meds  DVt proph: on apixaban  Code Status:Full COde Family Communication: none at bedside Disposition Plan: keep in SDU today   Consultants:  Cards  Antibiotics:  vanc/cefepime  HPI/Subjective: C/o cough, dyspnea, feels unwell  Objective: Filed Vitals:   03/05/14 0700  BP: 151/98  Pulse: 118  Temp:   Resp: 38    Intake/Output Summary (Last 24 hours) at 03/05/14 0739 Last data filed at 03/05/14 0643  Gross per 24 hour  Intake 724.74 ml  Output   3650 ml  Net -2925.26 ml   Filed Weights   03/03/14 1754 03/04/14 0500 03/05/14 0330  Weight: 104.327 kg (230 lb) 86.8 kg (191 lb 5.8 oz) 86.4 kg (190 lb 7.6 oz)    Exam:   General:  AAOx3, no distress  Cardiovascular: S1S2/Irregular rate and rhythm  Respiratory: scattered ronchi, few wheezes  Abdomen: soft, Nt, BS present  Musculoskeletal: trace edema   Data Reviewed: Basic Metabolic Panel:  Recent Labs Lab 03/03/14 0908 03/03/14 1851 03/04/14 0338  03/05/14 0251  NA 143 142 137 139  K 3.8 4.0 4.4 3.7  CL  --  104 102 99  CO2  --  21 19 22   GLUCOSE 81 165* 217* 262*  BUN  --  18 19 21   CREATININE  --  2.14* 2.14* 2.38*  CALCIUM  --  9.4 8.7 9.1   Liver Function Tests:  Recent Labs Lab 03/04/14 0338  AST 14  ALT 7  ALKPHOS 87  BILITOT 0.5  PROT 6.7  ALBUMIN 2.9*   No results found for this basename: LIPASE, AMYLASE,  in the last 168 hours No results found for this basename: AMMONIA,  in the last 168 hours CBC:  Recent Labs Lab 03/03/14 0908 03/03/14 1851 03/04/14 0855 03/05/14 0251  WBC  --  9.8 9.9 13.2*  NEUTROABS  --   --  7.8*  --   HGB 12.6 13.0 12.7 12.1  HCT 37.0 40.2 39.7 38.0  MCV  --  93.7 94.3 93.4  PLT  --  234 225 245   Cardiac Enzymes:  Recent Labs Lab 03/03/14 1851 03/03/14 2255 03/04/14 0338 03/04/14 1040  TROPONINI <0.30 <0.30 <0.30 <0.30   BNP (last 3 results)  Recent Labs  03/03/14 1851  PROBNP 1531.0*   CBG:  Recent Labs Lab 03/03/14 1034 03/03/14 2216 03/04/14 0824 03/04/14 1201 03/04/14 1639  GLUCAP 105* 178* 160* 139* 95    Recent Results (from the past 240 hour(s))  MRSA PCR SCREENING     Status: None   Collection Time    03/03/14  9:57 PM  Result Value Ref Range Status   MRSA by PCR NEGATIVE  NEGATIVE Final   Comment:            The GeneXpert MRSA Assay (FDA     approved for NASAL specimens     only), is one component of a     comprehensive MRSA colonization     surveillance program. It is not     intended to diagnose MRSA     infection nor to guide or     monitor treatment for     MRSA infections.     Studies: Dg Chest 2 View  03/04/2014   CLINICAL DATA:  Shortness of breath, fever.  EXAM: CHEST  2 VIEW  COMPARISON:  March 03, 2014.  FINDINGS: Stable cardiomediastinal silhouette. New airspace opacity is noted in the right upper lobe, as well as larger airspace opacity seen in the left upper lobe. These findings are most consistent with pneumonia.  No significant pleural effusion or pneumothorax is noted. Bony thorax appears intact.  IMPRESSION: Interval development of bilateral upper lobe airspace opacities are noted consistent with pneumonia.   Electronically Signed   By: Sabino Dick M.D.   On: 03/04/2014 09:43   Dg Chest Portable 1 View  03/03/2014   CLINICAL DATA:  Shortness of breath with chest pain for 1 day.  EXAM: PORTABLE CHEST - 1 VIEW  COMPARISON:  01/15/2014 and 01/06/2014.  FINDINGS: 1832 hr. There are lower lung volumes. The heart size and mediastinal contours are stable. There are increased interstitial markings throughout the lungs associated with poor definition of the pulmonary vasculature, most consistent with pulmonary edema. There is no confluent airspace opacity or significant pleural effusion. There is no evidence of pneumothorax or acute osseous abnormality. Telemetry leads overlie the chest.  IMPRESSION: New diffuse interstitial prominence most consistent with acute congestive heart failure. Atypical infection is considered less likely.   Electronically Signed   By: Camie Patience M.D.   On: 03/03/2014 18:48    Scheduled Meds: . albuterol  2.5 mg Inhalation Q4H  . amitriptyline  25 mg Oral QHS  . antiseptic oral rinse  15 mL Mouth Rinse BID  . apixaban  5 mg Oral BID  . atorvastatin  20 mg Oral q1800  . benzonatate  100 mg Oral TID  . ceFEPime (MAXIPIME) IV  1 g Intravenous QHS  . cloNIDine  0.1 mg Oral 3 times per day  . diltiazem  10 mg Intravenous Once  . famotidine  20 mg Oral Daily  . febuxostat  40 mg Oral Daily  . feeding supplement (ENSURE COMPLETE)  237 mL Oral BID BM  . furosemide  80 mg Intravenous 3 times per day  . insulin aspart  0-9 Units Subcutaneous TID WC  . insulin aspart  20 Units Subcutaneous TID PC  . insulin glargine  40 Units Subcutaneous Daily  . Linaclotide  290 mcg Oral Daily  . metoprolol  50 mg Oral BID  . pantoprazole  40 mg Oral Daily  . potassium chloride SA  20 mEq Oral BID  .  sodium chloride  3 mL Intravenous Q12H  . vancomycin  1,000 mg Intravenous Q24H   Continuous Infusions: . amiodarone 30 mg/hr (03/05/14 0605)  . diltiazem (CARDIZEM) infusion 15 mg/hr (03/05/14 0646)   Antibiotics Given (last 72 hours)   Date/Time Action Medication Dose Rate   03/03/14 2251 Given   levofloxacin (LEVAQUIN) IVPB 750 mg 750 mg 100 mL/hr   03/04/14 0103 Given  ceFEPIme (MAXIPIME) 1 g in dextrose 5 % 50 mL IVPB 1 g 100 mL/hr   03/04/14 0159 Given   vancomycin (VANCOCIN) 2,000 mg in sodium chloride 0.9 % 500 mL IVPB 2,000 mg 250 mL/hr   03/04/14 2120 Given   ceFEPIme (MAXIPIME) 1 g in dextrose 5 % 50 mL IVPB 1 g 100 mL/hr   03/05/14 0505 Given   vancomycin (VANCOCIN) IVPB 1000 mg/200 mL premix 1,000 mg 200 mL/hr      Principal Problem:   Acute respiratory failure Active Problems:   Obstructive sleep apnea   Acute CHF   Chest pain    Time spent: 67min    Mckenleigh Tarlton  [facility] Pager [phone]. If 7PM-7AM, please contact night-coverage at [email or web address], password [facility] 03/05/2014, 7:39 AM  LOS: 2 days

## 2014-03-05 NOTE — Progress Notes (Signed)
No change in patient condition increased Cardizem drip for heart rate control. Darrick Grinder NP notified.

## 2014-03-05 NOTE — Consult Note (Signed)
PULMONARY / CRITICAL CARE MEDICINE   Name: [name] MRN: [id number] DOB: 05/28/1946    ADMISSION DATE:  03/03/2014 CONSULTATION DATE:  7/02  REFERRING MD :  Fanny Bien PRIMARY SERVICE: IM  CHIEF COMPLAINT:  Shortness of breath and chest pain   BRIEF PATIENT DESCRIPTION: 68 y.o. female with recently diagnosed with A-fib/flutter (apixaban) who presented to [facility] 6/30 with chest pain and shortness of breath after a scheduled cardioversion. Being treated for possible aspiration pneumonia vs pulm edema. 7/02 PCCM consulted for worsening shortness of breath and increasing O2 requirements.   SIGNIFICANT EVENTS / STUDIES:  6/30 admitted with SOB, CP after scheduled cardioversion - CXR positive for possible aspiration PNA vs pulm edema 7/02 PCCM consulted for increased work of breathing and increasing oxygen requirements.   LINES / TUBES: PIVs Central line 7/02 >>>  CULTURES: Blood cultures 7/02 >>>  ANTIBIOTICS: Cefepime 7/1 >>> 7/2 Vancomycin 7/1 >>> Zosyn 7/2 >>>  HISTORY OF PRESENT ILLNESS: 68 y.o. Female with h/o morbid obesity, htn, DM, Diastolic heart failure, sleep apnea (home cpap), MI (2009), pneumonia, GERD (barrets esophagus), CKD (stage IV - followed by Dr. Erling Cruz) and recently diagnosed with A-fib/flutter (apixaban) who [facility] 6/30 with chestpain and shortness of breath after a scheduled cardioversion. In the ED her CXR was positive for possible aspiration pneumonia vs pulm edema and she was given IV lasix, she also had a mildly elevated temp - antibiotics started in ED. 7/02 PCCM consulted for worsening shortness of breath and increasing O2 requirements.   PAST MEDICAL HISTORY :  Past Medical History  Diagnosis Date  . Atrial flutter   . Hypertension   . Diabetes mellitus   . Diastolic heart failure   . Asthma   . Hyperlipidemia   . Fatty liver   . Esophageal dysmotility   . Arthritis   . Congestive heart failure   . Sleep apnea      wears CPAP  . Family history of malignant neoplasm of gastrointestinal tract   . Fatty tumor fatty tumor back  . Coronary atherosclerosis of native coronary artery   . Morbid obesity   . Myocardial infarction 2009  . Dysrhythmia     afib,flutter hx  . Heart murmur   . Peripheral vascular disease   . Shortness of breath   . Pneumonia     hx  . GERD (gastroesophageal reflux disease)     barrets esophagus  . Anginal pain     occ; non-ischemic Lexiscan 09/2012  . Kidney disease     CKD stage IV (Dr. Erling Cruz)   Past Surgical History  Procedure Laterality Date  . Coronary angioplasty with stent placement    . Breast lumpectomy      right  . Tubal ligation    . Tonsillectomy    . Total knee arthroplasty Right 12/15/2013    Procedure: RIGHT TOTAL KNEE ARTHROPLASTY;  Surgeon: Alta Corning, MD;  Location: [place];  Service: Orthopedics;  Laterality: Right;  . Cardioversion N/A 03/03/2014    Procedure: CARDIOVERSION;  Surgeon: Laverda Page, MD;  Location: [place];  Service: Cardiovascular;  Laterality: N/A;   Prior to Admission medications   Medication Sig Start Date End Date Taking? Authorizing Provider  acetaminophen (TYLENOL) 500 MG tablet Take 500 mg by mouth every 6 (six) hours as needed for mild pain.   Yes Historical Provider, MD  albuterol (PROVENTIL HFA;VENTOLIN HFA) 108 (90 BASE) MCG/ACT inhaler Inhale 2 puffs into the  lungs every 4 (four) hours as needed for wheezing.    Yes Historical Provider, MD  amitriptyline (ELAVIL) 25 MG tablet Take 25 mg by mouth at bedtime.     Yes Historical Provider, MD  amLODipine (NORVASC) 10 MG tablet Take 10 mg by mouth daily.   Yes Roxy Cedar, CMA  apixaban (ELIQUIS) 5 MG TABS tablet Take 5 mg by mouth 2 (two) times daily.   Yes Historical Provider, MD  cloNIDine (CATAPRES) 0.3 MG tablet Take 0.3 mg by mouth 2 (two) times daily.     Yes Historical Provider, MD  clopidogrel (PLAVIX) 75 MG tablet Take 75 mg by mouth daily.    Yes  Historical Provider, MD  dicyclomine (BENTYL) 10 MG capsule Take 10 mg by mouth 4 (four) times daily as needed (IBS).   Yes Historical Provider, MD  esomeprazole (NEXIUM) 40 MG capsule Take 40 mg by mouth daily at 12 noon.   Yes Historical Provider, MD  febuxostat (ULORIC) 40 MG tablet Take 40 mg by mouth daily.    Yes Historical Provider, MD  insulin glargine (LANTUS) 100 UNIT/ML injection Inject 0.4 mLs (40 Units total) into the skin daily. 12/23/13  Yes Delfina Redwood, MD  insulin lispro (HUMALOG) 100 UNIT/ML injection Inject 0.2 mLs (20 Units total) into the skin 3 (three) times daily after meals. 12/23/13  Yes Delfina Redwood, MD  Linaclotide Rolan Lipa) 145 MCG CAPS capsule Take 290 mcg by mouth daily.   Yes Historical Provider, MD  methocarbamol (ROBAXIN-750) 750 MG tablet Take 1 tablet (750 mg total) by mouth every 8 (eight) hours as needed for muscle spasms. 12/23/13  Yes Erlene Senters, PA-C  metoprolol (LOPRESSOR) 50 MG tablet Take 50 mg by mouth 2 (two) times daily.   Yes Historical Provider, MD  nitroGLYCERIN (NITROSTAT) 0.4 MG SL tablet Place 0.4 mg under the tongue every 5 (five) minutes as needed for chest pain.    Yes Historical Provider, MD  oxyCODONE-acetaminophen (PERCOCET/ROXICET) 5-325 MG per tablet Take 1-2 tablets by mouth every 6 (six) hours as needed for severe pain. 12/23/13  Yes Delfina Redwood, MD  potassium chloride SA (K-DUR,KLOR-CON) 20 MEQ tablet Take 20 mEq by mouth 2 (two) times daily.    Yes Historical Provider, MD  ranitidine (ZANTAC) 150 MG tablet Take 1 tablet (150 mg total) by mouth at bedtime. 07/29/13  Yes Willia Craze, NP  rosuvastatin (CRESTOR) 10 MG tablet Take 10 mg by mouth daily.     Yes Historical Provider, MD  torsemide (DEMADEX) 20 MG tablet Take 40 mg by mouth 2 (two) times daily.   Yes Historical Provider, MD  Vitamin D, Ergocalciferol, (DRISDOL) 50000 UNITS CAPS capsule Take 50,000 Units by mouth every Monday.   Yes Historical Provider, MD   zolpidem (AMBIEN) 10 MG tablet Take 10 mg by mouth at bedtime as needed for sleep.    Yes Historical Provider, MD   Allergies  Allergen Reactions  . Codeine Nausea And Vomiting  . Penicillins Nausea And Vomiting  . Sulfa Antibiotics Itching and Nausea And Vomiting    "everything I seen was red"  . Other Itching    Adhesive from ekg leads    FAMILY HISTORY:  Family History  Problem Relation Age of Onset  . Heart disease Mother   . Cancer Mother     bladder  . Kidney disease Mother   . Ovarian cancer Daughter   . Stomach cancer Maternal Uncle   . Colon cancer Maternal Aunt   .  Esophageal cancer Neg Hx    SOCIAL HISTORY:  reports that she has never smoked. She has never used smokeless tobacco. She reports that she does not drink alcohol or use illicit drugs.  REVIEW OF SYSTEMS: C/o worsening SOB   SUBJECTIVE:  Patient currently requiring 40% venti mask to maintian O2 sats >92% On Cardizem gtt and amiodarone for a-fib  Continues to be hypertensive  VITAL SIGNS: Temp:  [98.5 F (36.9 C)-101.6 F (38.7 C)] 101.6 F (38.7 C) (07/02 1200) Pulse Rate:  [78-167] 125 (07/02 1100) Resp:  [15-43] 21 (07/02 1200) BP: (96-184)/(49-120) 131/73 mmHg (07/02 1200) SpO2:  [87 %-98 %] 94 % (07/02 1315) FiO2 (%):  [40 %] 40 % (07/02 1315) Weight:  [86.4 kg (190 lb 7.6 oz)] 86.4 kg (190 lb 7.6 oz) (07/02 0330) HEMODYNAMICS:   VENTILATOR SETTINGS: Vent Mode:  [-]  FiO2 (%):  [40 %] 40 % INTAKE / OUTPUT: Intake/Output     07/01 0701 - 07/02 0700 07/02 0701 - 07/03 0700   P.O. 110 50   I.V. (mL/kg) 364.7 (4.2) 162.8 (1.9)   IV Piggyback 250    Total Intake(mL/kg) 724.7 (8.4) 212.8 (2.5)   Urine (mL/kg/hr) 3650 (1.8) 625 (0.9)   Total Output 3650 625   Net -2925.3 -412.2         PHYSICAL EXAMINATION: General: Morbidly obese acutely ill appearing female, moderate distress Neuro: A/Ox4, anxious affect, follows all commands, MAEs HEENT: PERRL, Venti mask  Cardiovascular: IRIR,  questionable pulmonic murmur?  Lungs: Crackles throughout, expiratory wheeze Abdomen: Rotund, non-tender Musculoskeletal: no demormity Skin: intact  LABS:  CBC  Recent Labs Lab 03/03/14 1851 03/04/14 0855 03/05/14 0251  WBC 9.8 9.9 13.2*  HGB 13.0 12.7 12.1  HCT 40.2 39.7 38.0  PLT 234 225 245   Coag's No results found for this basename: APTT, INR,  in the last 168 hours BMET  Recent Labs Lab 03/03/14 1851 03/04/14 0338 03/05/14 0251  NA 142 137 139  K 4.0 4.4 3.7  CL 104 102 99  CO2 21 19 22   BUN 18 19 21   CREATININE 2.14* 2.14* 2.38*  GLUCOSE 165* 217* 262*   Electrolytes  Recent Labs Lab 03/03/14 1851 03/04/14 0338 03/05/14 0251  CALCIUM 9.4 8.7 9.1   Sepsis Markers  Recent Labs Lab 03/03/14 1851 03/05/14 0251  PROCALCITON <0.10 0.11   ABG  Recent Labs Lab 03/05/14 1427  PHART 7.468*  PCO2ART 33.2*  PO2ART 56.0*   Liver Enzymes  Recent Labs Lab 03/04/14 0338  AST 14  ALT 7  ALKPHOS 87  BILITOT 0.5  ALBUMIN 2.9*   Cardiac Enzymes  Recent Labs Lab 03/03/14 1851 03/03/14 2255 03/04/14 0338 03/04/14 1040  TROPONINI <0.30 <0.30 <0.30 <0.30  PROBNP 1531.0*  --   --   --    Glucose  Recent Labs Lab 03/04/14 0824 03/04/14 1201 03/04/14 1639 03/04/14 2150 03/05/14 0811 03/05/14 1212  GLUCAP 160* 139* 95 229* 267* 245*    Imaging Dg Chest 2 View  03/04/2014   CLINICAL DATA:  Shortness of breath, fever.  EXAM: CHEST  2 VIEW  COMPARISON:  March 03, 2014.  FINDINGS: Stable cardiomediastinal silhouette. New airspace opacity is noted in the right upper lobe, as well as larger airspace opacity seen in the left upper lobe. These findings are most consistent with pneumonia. No significant pleural effusion or pneumothorax is noted. Bony thorax appears intact.  IMPRESSION: Interval development of bilateral upper lobe airspace opacities are noted consistent with pneumonia.  Electronically Signed   By: Sabino Dick M.D.   On: 03/04/2014  09:43   Dg Chest Port 1 View  03/05/2014   CLINICAL DATA:  Short of breath  EXAM: PORTABLE CHEST - 1 VIEW  COMPARISON:  03/04/2014  FINDINGS: Interval progression of advance diffuse bilateral airspace disease. No significant effusion.  IMPRESSION: Diffuse bilateral airspace disease has progressed since yesterday. This most likely is edema however underlying infection could be present.   Electronically Signed   By: Franchot Gallo M.D.   On: 03/05/2014 14:35   Dg Chest Portable 1 View  03/03/2014   CLINICAL DATA:  Shortness of breath with chest pain for 1 day.  EXAM: PORTABLE CHEST - 1 VIEW  COMPARISON:  01/15/2014 and 01/06/2014.  FINDINGS: 1832 hr. There are lower lung volumes. The heart size and mediastinal contours are stable. There are increased interstitial markings throughout the lungs associated with poor definition of the pulmonary vasculature, most consistent with pulmonary edema. There is no confluent airspace opacity or significant pleural effusion. There is no evidence of pneumothorax or acute osseous abnormality. Telemetry leads overlie the chest.  IMPRESSION: New diffuse interstitial prominence most consistent with acute congestive heart failure. Atypical infection is considered less likely.   Electronically Signed   By: Camie Patience M.D.   On: 03/03/2014 18:48   CXR: 7/02 with worsening pulm edema, probable pneumonia retrocariac  ASSESSMENT / PLAN:  PULMONARY A: Acute respiratory failure d/t possible aspiration pna?(after cardioversion) Vs pulm edema - Increasing O2 needs - stable on 40% venti mask but at risk for possible decompensation Respiratory alkalosis Pulm edema H/o sleep apnea P:    May need bipap vs intubation, would lke to avoid bipap with GERD history and possible aspiration risk Maintian O2 sats >92% Lasix if CVP >12, place line Ct Albuterol Q4h Ct home CPAP qhs F/u CXR in AM Abx see ID Reduce afterload Lower rvr fib rate  CARDIOVASCULAR A:  Atrial  fibrillation - on Cardizem gtt and amiodarone gtt Hypertension H/o diastolic heart failure P:  Cards is following Ct cardizem and amiodarone Hydralazine x 1 Hold meds while NPO (metoprolol, clonidine)  Low dose metoprolol PRN for HR > 120 Echo  RENAL A:   CKD - Serum creatinine baseline 2.1 - 2.9 - possibly d/t aggressive diuretics , unclear P:   Insert CVC to monitor CVP Ct Lasix unless CVP <12 (goal CVP per cards)  F/u BMP, Mag Phos Sup electrolytes as needed  GASTROINTESTINAL A:   H/o GERD (barretts esophagus) - questionable contribution to aspiration? IBS P:   Protonix 40 BID IV started Hold home bentyl for now - given possible CHF exacerbation / HTN. NPO May need swallow in future, barium study  HEMATOLOGIC A: Stable P:  F/u CBC Continue home Apixaban   INFECTIOUS A:   Possible aspiration pneumonia Leukocytosis Febrile P:   D/c cefepime Ct Vanc/Zosyn Check lactate Monitor WBCs and fever curve  ENDOCRINE A:   Diabetes P:   SSI  NEUROLOGIC A:   H/o Depression H/o insomina Chronic pain? P:   Hold home amitriptyline, ambien Morphine PRN   RASS goal: 0  TODAY'S SUMMARY: Patient transferred to the ICU and care transferred to PCCM d/t increasing work of breathing and increasing O2 requirements. Plan to insert central line for CVP monitoring. May require bipap vs intubation if respiratory condition deteriorates further. Likley needs more lasix, pending cvp assessment  Lalla Brothers ACNP student   Montey Hora, PA - [facility] Pgr: [phone]  or [phone]    I have personally obtained a history, examined the patient, evaluated laboratory and imaging results, formulated the assessment and plan and placed orders. CRITICAL CARE: The patient is critically ill with multiple organ systems failure and requires high complexity decision making for assessment and support, frequent evaluation and titration of  therapies, application of advanced monitoring technologies and extensive interpretation of multiple databases. Critical Care Time devoted to patient care services described in this note is 35 minutes  Lavon Paganini. Titus Mould, MD, [place] Pgr: [facility]  Pulmonary and [place] Pager: [phone]  03/05/2014, 2:42 PM

## 2014-03-05 NOTE — Progress Notes (Signed)
eLink Physician-Brief Progress Note Patient Name: [name] DOB: 1946/04/28 MRN: [id number]  Date of Service  03/05/2014   HPI/Events of Note   Oozing from CVL site.  eICU Interventions  Will have thrombi pad applied.   Intervention Category Major Interventions: Acute renal failure - evaluation and management;Other:  Larraine Argo 03/05/2014, 6:38 PM

## 2014-03-05 NOTE — Procedures (Signed)
Central Venous Catheter Insertion Procedure Note [name] [id number] [birth year]  Procedure: Insertion of Central Venous Catheter Indications: Assessment of intravascular volume, Drug and/or fluid administration and Frequent blood sampling  Procedure Details Consent: Risks of procedure as well as the alternatives and risks of each were explained to the (patient/caregiver).  Consent for procedure obtained. Time Out: Verified patient identification, verified procedure, site/side was marked, verified correct patient position, special equipment/implants available, medications/allergies/relevent history reviewed, required imaging and test results available.  Performed  Maximum sterile technique was used including antiseptics, cap, gloves, gown, hand hygiene, mask and sheet. Skin prep: Chlorhexidine; local anesthetic administered A antimicrobial bonded/coated triple lumen catheter was placed in the left internal jugular vein using the Seldinger technique.  Evaluation Blood flow good Complications: No apparent complications Patient did tolerate procedure well. Chest X-ray ordered to verify placement.  CXR: pending.  [name] 03/05/2014, 4:21 PM  [place] Need cvp  [name]. Titus Mould, MD, [place] Pgr: [facility]

## 2014-03-06 DIAGNOSIS — E1165 Type 2 diabetes mellitus with hyperglycemia: Secondary | ICD-10-CM

## 2014-03-06 DIAGNOSIS — I5031 Acute diastolic (congestive) heart failure: Secondary | ICD-10-CM | POA: Diagnosis present

## 2014-03-06 DIAGNOSIS — D689 Coagulation defect, unspecified: Secondary | ICD-10-CM | POA: Diagnosis present

## 2014-03-06 DIAGNOSIS — J810 Acute pulmonary edema: Secondary | ICD-10-CM

## 2014-03-06 DIAGNOSIS — I4891 Unspecified atrial fibrillation: Secondary | ICD-10-CM

## 2014-03-06 DIAGNOSIS — E1129 Type 2 diabetes mellitus with other diabetic kidney complication: Secondary | ICD-10-CM

## 2014-03-06 DIAGNOSIS — N184 Chronic kidney disease, stage 4 (severe): Secondary | ICD-10-CM

## 2014-03-06 DIAGNOSIS — I517 Cardiomegaly: Secondary | ICD-10-CM

## 2014-03-06 DIAGNOSIS — J690 Pneumonitis due to inhalation of food and vomit: Secondary | ICD-10-CM

## 2014-03-06 LAB — BASIC METABOLIC PANEL
Anion gap: 16 — ABNORMAL HIGH (ref 5–15)
BUN: 21 mg/dL (ref 6–23)
CO2: 25 mEq/L (ref 19–32)
Calcium: 8.9 mg/dL (ref 8.4–10.5)
Chloride: 98 mEq/L (ref 96–112)
Creatinine, Ser: 2.43 mg/dL — ABNORMAL HIGH (ref 0.50–1.10)
GFR calc Af Amer: 23 mL/min — ABNORMAL LOW (ref >90)
GFR calc non Af Amer: 19 mL/min — ABNORMAL LOW (ref >90)
Glucose, Bld: 241 mg/dL — ABNORMAL HIGH (ref 70–99)
Potassium: 4 mEq/L (ref 3.7–5.3)
Sodium: 139 mEq/L (ref 137–147)

## 2014-03-06 LAB — GLUCOSE, CAPILLARY
Glucose-Capillary: 257 mg/dL — ABNORMAL HIGH (ref 70–99)
Glucose-Capillary: 301 mg/dL — ABNORMAL HIGH (ref 70–99)
Glucose-Capillary: 203 mg/dL — ABNORMAL HIGH (ref 70–99)
Glucose-Capillary: 163 mg/dL — ABNORMAL HIGH (ref 70–99)

## 2014-03-06 LAB — CBC
HCT: 34.7 % — ABNORMAL LOW (ref 36.0–46.0)
Hemoglobin: 11.1 g/dL — ABNORMAL LOW (ref 12.0–15.0)
MCH: 29.5 pg (ref 26.0–34.0)
MCHC: 32 g/dL (ref 30.0–36.0)
MCV: 92.3 fL (ref 78.0–100.0)
Platelets: 254 10*3/uL (ref 150–400)
RBC: 3.76 MIL/uL — ABNORMAL LOW (ref 3.87–5.11)
RDW: 15.4 % (ref 11.5–15.5)
WBC: 12.8 10*3/uL — ABNORMAL HIGH (ref 4.0–10.5)

## 2014-03-06 LAB — PROTIME-INR
INR: 2.12 — ABNORMAL HIGH (ref 0.00–1.49)
Prothrombin Time: 23.7 seconds — ABNORMAL HIGH (ref 11.6–15.2)

## 2014-03-06 LAB — APTT: aPTT: 48 seconds — ABNORMAL HIGH (ref 24–37)

## 2014-03-06 MED ORDER — AMIODARONE IV BOLUS ONLY 150 MG/100ML
150.0000 mg | Freq: Once | INTRAVENOUS | Status: AC
  Administered 2014-03-06: 150 mg via INTRAVENOUS

## 2014-03-06 MED ORDER — LIDOCAINE-EPINEPHRINE 1 %-1:100000 IJ SOLN
10.0000 mL | Freq: Once | INTRAMUSCULAR | Status: AC
  Administered 2014-03-06: 10 mL via INTRADERMAL
  Filled 2014-03-06: qty 10

## 2014-03-06 MED ORDER — PIPERACILLIN-TAZOBACTAM 3.375 G IVPB
3.3750 g | Freq: Three times a day (TID) | INTRAVENOUS | Status: AC
  Administered 2014-03-06 – 2014-03-10 (×14): 3.375 g via INTRAVENOUS
  Filled 2014-03-06 (×16): qty 50

## 2014-03-06 MED ORDER — LIDOCAINE HCL (PF) 1 % IJ SOLN
INTRAMUSCULAR | Status: AC
  Filled 2014-03-06: qty 5

## 2014-03-06 MED ORDER — SODIUM PHOSPHATE 3 MMOLE/ML IV SOLN
20.0000 mmol | Freq: Once | INTRAVENOUS | Status: AC
  Administered 2014-03-06: 20 mmol via INTRAVENOUS
  Filled 2014-03-06: qty 6.67

## 2014-03-06 MED ORDER — THROMBI-PAD 3"X3" EX PADS
1.0000 | MEDICATED_PAD | Freq: Once | CUTANEOUS | Status: AC
Start: 2014-03-06 — End: 2014-03-06
  Administered 2014-03-06: 1 via TOPICAL
  Filled 2014-03-06: qty 1

## 2014-03-06 MED ORDER — PANTOPRAZOLE SODIUM 40 MG PO TBEC
40.0000 mg | DELAYED_RELEASE_TABLET | Freq: Every day | ORAL | Status: DC
  Administered 2014-03-06 – 2014-03-13 (×8): 40 mg via ORAL
  Filled 2014-03-06 (×7): qty 1

## 2014-03-06 MED ORDER — APIXABAN 5 MG PO TABS
5.0000 mg | ORAL_TABLET | Freq: Two times a day (BID) | ORAL | Status: DC
  Administered 2014-03-06 – 2014-03-13 (×14): 5 mg via ORAL
  Filled 2014-03-06 (×16): qty 1

## 2014-03-06 MED ORDER — HYDROCODONE-HOMATROPINE 5-1.5 MG/5ML PO SYRP
5.0000 mL | ORAL_SOLUTION | ORAL | Status: DC | PRN
  Administered 2014-03-06 – 2014-03-07 (×3): 5 mL via ORAL
  Filled 2014-03-06 (×3): qty 5

## 2014-03-06 MED ORDER — THROMBI-PAD 3"X3" EX PADS
1.0000 | MEDICATED_PAD | Freq: Once | CUTANEOUS | Status: AC
Start: 2014-03-06 — End: 2014-03-06
  Administered 2014-03-06: 1 via TOPICAL

## 2014-03-06 MED ORDER — ALBUTEROL SULFATE (2.5 MG/3ML) 0.083% IN NEBU
2.5000 mg | INHALATION_SOLUTION | Freq: Four times a day (QID) | RESPIRATORY_TRACT | Status: DC
  Administered 2014-03-06 – 2014-03-08 (×7): 2.5 mg via RESPIRATORY_TRACT
  Filled 2014-03-06 (×8): qty 3

## 2014-03-06 NOTE — Progress Notes (Signed)
  Echocardiogram 2D Echocardiogram has been performed.  [name] 03/06/2014, 10:19 AM

## 2014-03-06 NOTE — Progress Notes (Signed)
Noted slow bloody ooze at right IJ site- manual pressure held for 10 mins. Pressure drsg applied. Will continue to monitor.

## 2014-03-06 NOTE — Progress Notes (Signed)
Dr Nelda Marseille at bedside to assess bleeding. No orders given.

## 2014-03-06 NOTE — Progress Notes (Signed)
Inpatient Diabetes Program Recommendations  AACE/ADA: New Consensus Statement on Inpatient Glycemic Control (2013)  Target Ranges:  Prepandial:   less than 140 mg/dL      Peak postprandial:   less than 180 mg/dL (1-2 hours)      Critically ill patients:  140 - 180 mg/dL  Results for RYLENN, VULGAMORE (MRN [id number]) as of 03/06/2014 16:02  Ref. Range 03/05/2014 08:11 03/05/2014 12:12 03/05/2014 18:18 03/05/2014 21:11 03/06/2014 07:55 03/06/2014 12:23  Glucose-Capillary Latest Range: 70-99 mg/dL 267 (H) 245 (H) 223 (H) 243 (H) 257 (H) 301 (H)   Note:  Patient very ill.  Not receiving meal coverage as ordered because not able to eat enough per nursing staff.  Lantus dose increased yesterday to 45 units.  MD may want to consider increasing correction scale to moderate scale.  Consider increasing Lantus modestly tomorrow if morning CBG still well over 180 mg/dl.  Thank you.  Jaysiah Marchetta S. Marcelline Mates, RN, CNS, CDE Inpatient Diabetes Program, team pager [phone]

## 2014-03-06 NOTE — Progress Notes (Signed)
S: On Venti Mask, appears better than yesterday. PCCM consulted last night. Central line placed. Pt says she feels a little better than yesterday.   O:BP 140/60  Pulse 112  Temp(Src) 98.9 F (37.2 C) (Oral)  Resp 24  Ht 5\' 4"  (1.626 m)  Wt 190 lb 7.6 oz (86.4 kg)  BMI 32.68 kg/m2  SpO2 96%  Intake/Output Summary (Last 24 hours) at 03/06/14 0902 Last data filed at 03/06/14 0700  Gross per 24 hour  Intake 1201.64 ml  Output   1390 ml  Net -188.36 ml   Intake/Output: I/O last 3 completed shifts: In: 1905.8 [P.O.:50; I.V.:1255.8; IV [id number] Out: [id number]  Intake/Output this shift:    Weight change:  Gen: In bed, respiratory distress appears to be better today compared to yesterday. HEENT: AT, [place], EOMI, mildly dry oral mucosa [id number], no murmurs apprecaited Resp: On Ventimask- 10- 15L, no crackles or wheezing appreciated. Abd: Full, soft, bowel sounds heard Ext: No pedal edema, warm and well perfused. Neuro- Alert and oriented, moving all extremities.  Recent Labs Lab 03/03/14 0908 03/03/14 1851 03/04/14 0338 03/05/14 0251 03/05/14 1710 03/06/14 0410  NA 143 142 137 139  --  139  K 3.8 4.0 4.4 3.7  --  4.0  CL  --  104 102 99  --  98  CO2  --  21 19 22   --  25  GLUCOSE 81 165* 217* 262*  --  241*  BUN  --  18 19 21   --  21  CREATININE  --  2.14* 2.14* 2.38*  --  2.43*  ALBUMIN  --   --  2.9*  --   --   --   CALCIUM  --  9.4 8.7 9.1  --  8.9  PHOS  --   --   --   --  1.5*  --   AST  --   --  14  --   --   --   ALT  --   --  7  --   --   --    Liver Function Tests:  Recent Labs Lab 03/04/14 0338  AST 14  ALT 7  ALKPHOS 87  BILITOT 0.5  PROT 6.7  ALBUMIN 2.9*   CBC:  Recent Labs Lab 03/03/14 1851 03/04/14 0855 03/05/14 0251 03/05/14 1710 03/06/14 0410  WBC 9.8 9.9 13.2* 15.8* 12.8*  NEUTROABS  --  7.8*  --   --   --   HGB 13.0 12.7 12.1 12.0 11.1*  HCT 40.2 39.7 38.0 36.4 34.7*  MCV 93.7 94.3 93.4 91.5 92.3  PLT 234 225  245 267 254   Cardiac Enzymes:  Recent Labs Lab 03/03/14 1851 03/03/14 2255 03/04/14 0338 03/04/14 1040  TROPONINI <0.30 <0.30 <0.30 <0.30   CBG:  Recent Labs Lab 03/05/14 0811 03/05/14 1212 03/05/14 1818 03/05/14 2111 03/06/14 0755  GLUCAP 267* 245* 223* 243* 257*   Studies/Results: Dg Chest 2 View  03/04/2014   CLINICAL DATA:  Shortness of breath, fever.  EXAM: CHEST  2 VIEW  COMPARISON:  March 03, 2014.  FINDINGS: Stable cardiomediastinal silhouette. New airspace opacity is noted in the right upper lobe, as well as larger airspace opacity seen in the left upper lobe. These findings are most consistent with pneumonia. No significant pleural effusion or pneumothorax is noted. Bony thorax appears intact.  IMPRESSION: Interval development of bilateral upper lobe airspace opacities are noted consistent with pneumonia.   Electronically Signed   By: Jeneen Rinks  Green M.D.   On: 03/04/2014 09:43   Dg Chest Port 1 View  03/05/2014   CLINICAL DATA:  Evaluate line placement  EXAM: PORTABLE CHEST - 1 VIEW  COMPARISON:  03/05/2014 at 1418 hr  FINDINGS: Multifocal patchy opacities, left lung predominant, mildly improved. The rapid change favors interstitial edema, although underlying multifocal pneumonia remains possible.  No pleural effusion or pneumothorax.  Cardiomegaly.  Right IJ venous catheter terminates at the cavoatrial junction.  IMPRESSION: Right IJ venous catheter terminates at the cavoatrial junction. No pneumothorax.  Multifocal patchy opacities, left lung predominant, mildly improved. This appearance favors interstitial edema, although underlying multifocal pneumonia remains possible.   Electronically Signed   By: Julian Hy M.D.   On: 03/05/2014 17:14   Dg Chest Port 1 View  03/05/2014   CLINICAL DATA:  Short of breath  EXAM: PORTABLE CHEST - 1 VIEW  COMPARISON:  03/04/2014  FINDINGS: Interval progression of advance diffuse bilateral airspace disease. No significant effusion.   IMPRESSION: Diffuse bilateral airspace disease has progressed since yesterday. This most likely is edema however underlying infection could be present.   Electronically Signed   By: Franchot Gallo M.D.   On: 03/05/2014 14:35   . albuterol  2.5 mg Inhalation Q6H  . amitriptyline  25 mg Oral QHS  . antiseptic oral rinse  15 mL Mouth Rinse BID  . atorvastatin  20 mg Oral q1800  . cloNIDine  0.1 mg Oral 3 times per day  . diltiazem  10 mg Intravenous Once  . febuxostat  40 mg Oral Daily  . feeding supplement (ENSURE COMPLETE)  237 mL Oral BID BM  . furosemide  80 mg Intravenous BID  . insulin aspart  0-9 Units Subcutaneous TID WC  . insulin aspart  20 Units Subcutaneous TID PC  . insulin glargine  45 Units Subcutaneous Daily  . lidocaine-EPINEPHrine  10 mL Intradermal Once  . Linaclotide  290 mcg Oral Daily  . metoprolol  50 mg Oral BID  . pantoprazole  40 mg Oral Q1200  . piperacillin-tazobactam (ZOSYN)  IV  2.25 g Intravenous 4 times per day  . potassium chloride SA  20 mEq Oral BID  . sodium chloride  3 mL Intravenous Q12H  . sodium phosphate  Dextrose 5% IVPB  20 mmol Intravenous Once  . vancomycin  1,000 mg Intravenous Q24H    BMET    Component Value Date/Time   NA 139 03/06/2014 0410   K 4.0 03/06/2014 0410   CL 98 03/06/2014 0410   CO2 25 03/06/2014 0410   GLUCOSE 241* 03/06/2014 0410   BUN 21 03/06/2014 0410   CREATININE 2.43* 03/06/2014 0410   CALCIUM 8.9 03/06/2014 0410   GFRNONAA 19* 03/06/2014 0410   GFRAA 23* 03/06/2014 0410   CBC    Component Value Date/Time   WBC 12.8* 03/06/2014 0410   RBC 3.76* 03/06/2014 0410   HGB 11.1* 03/06/2014 0410   HCT 34.7* 03/06/2014 0410   PLT 254 03/06/2014 0410   MCV 92.3 03/06/2014 0410   MCH 29.5 03/06/2014 0410   MCHC 32.0 03/06/2014 0410   RDW 15.4 03/06/2014 0410   LYMPHSABS 1.1 03/04/2014 0855   MONOABS 0.8 03/04/2014 0855   EOSABS 0.2 03/04/2014 0855   BASOSABS 0.0 03/04/2014 0855     Assessment/Plan:  1. Acute respiratory distress- Aetiology- ?  Aspiration PNA, Spiked a fever overnight- 102.1, Now on antibiotics- Vanc and Zosyn. ? Pulm edema, but no weight change. TID IV Lasix changed yesterday to  BID- as concern was less for fluid overload contributing to Respiratory distress, and also significant diuresis without improvement in respiratory status. Currently on IV Lasix- 80 BID. Will hold lasix for now and dose 80mg  IV prn given low CVP and rising Scr.  2. CKD4- Cr today-2.43 increasing since admission yesterday, improved compared to prior.- Cont to monitor.   3. DM- Per primary team.   4. HTN- Blood pressure WNL, management per Primary team.   5. Atria fib- Likely driven by lung infection. Management per Cards.  Emokpae, Ejiroghene IMTS PGY-1  I have seen and examined this patient and agree with plan as oultined by Dr. Denton Brick.Will hold lasix give CVP of 8 and rising Scr.  Appreciate PCCM assistance with her care. Creig Landin A,MD 03/06/2014 11:31 AM

## 2014-03-06 NOTE — ED Provider Notes (Signed)
  This was a shared visit with a mid-level provided (NP or PA).  Throughout the patient's course I was available for consultation/collaboration.  I saw the ECG (Sinus rhythm with PAC, rate 87 significant artifact, abnormal), relevant labs and studies - I agree with the interpretation.  On my exam the patient was in no distress. Patient had elected cardioversion earlier today, and now feels persistently worse. After discussion with the patient's cardiologist which was admitted here for further evaluation and management.      Carmin Muskrat, MD 03/06/14 [phone]

## 2014-03-06 NOTE — Progress Notes (Signed)
Advanced Heart Failure Rounding Note   Subjective:    Central line placed and transferred to ICU yesterday. Has presumed aspiration PNA (in setting of recent Dc-CV)   Abx expanded to cover anaerobes. CVP running 8-12. Breathing better. Renal function slightly worse again. Tmax 102  Remains in AF. Rate 95-115 on Amio and diltiazem.  Eliquis stopped due to bleeding at CVL site  Objective:   Weight Range:  Vital Signs:   Temp:  [98.2 F (36.8 C)-102.1 F (38.9 C)] 98.9 F (37.2 C) (07/03 0800) Pulse Rate:  [60-128] 105 (07/03 0900) Resp:  [15-38] 31 (07/03 0900) BP: (99-182)/(47-107) 162/83 mmHg (07/03 0900) SpO2:  [89 %-100 %] 95 % (07/03 0900) FiO2 (%):  [40 %-50 %] 50 % (07/03 0800)    Weight change: Filed Weights   03/03/14 1754 03/04/14 0500 03/05/14 0330  Weight: 104.327 kg (230 lb) 86.8 kg (191 lb 5.8 oz) 86.4 kg (190 lb 7.6 oz)    Intake/Output:   Intake/Output Summary (Last 24 hours) at 03/06/14 0943 Last data filed at 03/06/14 0900  Gross per 24 hour  Intake 1305.04 ml  Output   1515 ml  Net -209.96 ml    Physical Exam: CVP 8-10 General: Obese woman lying in bed on face mask . More comfortable HEENT: normal  Neck: supple. JVP hard to seeCarotids 2+ bilat; no bruits. No lymphadenopathy or thryomegaly appreciated.  Cor: PMI nonpalpbale. Distant. Irregular. No obvious murmur  Lungs: mildly tachypneic. clear Abdomen: Obese soft, nontender,. No hepatosplenomegaly. No bruits or masses. Good bowel sounds.  Extremities: no cyanosis, clubbing, rash, warm. No edema Neuro: alert & orientedx3, cranial nerves grossly intact. moves all 4 extremities w/o difficulty. Affect pleasant    Telemetry: A fib 115  Labs: Basic Metabolic Panel:  Recent Labs Lab 03/03/14 0908  03/03/14 1851 03/04/14 0338 03/05/14 0251 03/05/14 1710 03/06/14 0410  NA 143  --  142 137 139  --  139  K 3.8  --  4.0 4.4 3.7  --  4.0  CL  --   --  104 102 99  --  98  CO2  --   --  21 19  22   --  25  GLUCOSE 81  --  165* 217* 262*  --  241*  BUN  --   --  18 19 21   --  21  CREATININE  --   --  2.14* 2.14* 2.38*  --  2.43*  CALCIUM  --   < > 9.4 8.7 9.1  --  8.9  MG  --   --   --   --   --  2.1  --   PHOS  --   --   --   --   --  1.5*  --   < > = values in this interval not displayed.  Liver Function Tests:  Recent Labs Lab 03/04/14 0338  AST 14  ALT 7  ALKPHOS 87  BILITOT 0.5  PROT 6.7  ALBUMIN 2.9*   No results found for this basename: LIPASE, AMYLASE,  in the last 168 hours No results found for this basename: AMMONIA,  in the last 168 hours  CBC:  Recent Labs Lab 03/03/14 1851 03/04/14 0855 03/05/14 0251 03/05/14 1710 03/06/14 0410  WBC 9.8 9.9 13.2* 15.8* 12.8*  NEUTROABS  --  7.8*  --   --   --   HGB 13.0 12.7 12.1 12.0 11.1*  HCT 40.2 39.7 38.0 36.4 34.7*  MCV 93.7 94.3  93.4 91.5 92.3  PLT 234 225 245 267 254    Cardiac Enzymes:  Recent Labs Lab 03/03/14 1851 03/03/14 2255 03/04/14 0338 03/04/14 1040  TROPONINI <0.30 <0.30 <0.30 <0.30    BNP: BNP (last 3 results)  Recent Labs  03/03/14 1851  PROBNP 1531.0*     Other results:    Imaging: Dg Chest Port 1 View  03/05/2014   CLINICAL DATA:  Evaluate line placement  EXAM: PORTABLE CHEST - 1 VIEW  COMPARISON:  03/05/2014 at 1418 hr  FINDINGS: Multifocal patchy opacities, left lung predominant, mildly improved. The rapid change favors interstitial edema, although underlying multifocal pneumonia remains possible.  No pleural effusion or pneumothorax.  Cardiomegaly.  Right IJ venous catheter terminates at the cavoatrial junction.  IMPRESSION: Right IJ venous catheter terminates at the cavoatrial junction. No pneumothorax.  Multifocal patchy opacities, left lung predominant, mildly improved. This appearance favors interstitial edema, although underlying multifocal pneumonia remains possible.   Electronically Signed   By: Julian Hy M.D.   On: 03/05/2014 17:14   Dg Chest Port 1  View  03/05/2014   CLINICAL DATA:  Short of breath  EXAM: PORTABLE CHEST - 1 VIEW  COMPARISON:  03/04/2014  FINDINGS: Interval progression of advance diffuse bilateral airspace disease. No significant effusion.  IMPRESSION: Diffuse bilateral airspace disease has progressed since yesterday. This most likely is edema however underlying infection could be present.   Electronically Signed   By: Franchot Gallo M.D.   On: 03/05/2014 14:35     Medications:     Scheduled Medications: . albuterol  2.5 mg Inhalation Q6H  . amitriptyline  25 mg Oral QHS  . antiseptic oral rinse  15 mL Mouth Rinse BID  . atorvastatin  20 mg Oral q1800  . cloNIDine  0.1 mg Oral 3 times per day  . diltiazem  10 mg Intravenous Once  . febuxostat  40 mg Oral Daily  . feeding supplement (ENSURE COMPLETE)  237 mL Oral BID BM  . furosemide  80 mg Intravenous BID  . insulin aspart  0-9 Units Subcutaneous TID WC  . insulin aspart  20 Units Subcutaneous TID PC  . insulin glargine  45 Units Subcutaneous Daily  . lidocaine-EPINEPHrine  10 mL Intradermal Once  . Linaclotide  290 mcg Oral Daily  . metoprolol  50 mg Oral BID  . pantoprazole  40 mg Oral Q1200  . piperacillin-tazobactam (ZOSYN)  IV  2.25 g Intravenous 4 times per day  . potassium chloride SA  20 mEq Oral BID  . sodium chloride  3 mL Intravenous Q12H  . sodium phosphate  Dextrose 5% IVPB  20 mmol Intravenous Once  . vancomycin  1,000 mg Intravenous Q24H    Infusions: . sodium chloride 5 mL/hr at 03/05/14 1934  . sodium chloride 5 mL/hr at 03/05/14 1934  . sodium chloride Stopped (03/05/14 2000)  . amiodarone 30 mg/hr (03/05/14 2319)  . diltiazem (CARDIZEM) infusion 20 mg/hr (03/06/14 0651)    PRN Medications: acetaminophen, acetaminophen, methocarbamol, metoprolol, morphine injection, ondansetron (ZOFRAN) IV, ondansetron, oxyCODONE-acetaminophen, zolpidem   Assessment:  1. Acute respiratory failure  2. A/c chronic diastolic HF  3. Probable aspiration  PNA  4. Chronic renal failure, stage IV  5. Obesity  6. AF s/p DC-CV on 6/30  7. HTN  8. DM2   Plan/Discussion:    She is much improved. CXR shows edema but renal function getting worse. CVP 8-10 range. Ok to give one dose lasix today but would hold tonight's  dose and see how it goes. Appreciate PCCM's exceelent care.   She is back in AF after recent DC-CV. Agree with amio and diltiazem. Wil give extra amio bolus now to help with rate.  Would not re-cardiovert at this time as she will likely just revert back to AF until resp status is improved. If she is off anticoagulation for 24 hours or more will need repeat TEE prior to DC-CV. Given age, renal function and weight 5mg  bid is correct dose of apixaban. (elevated INR is due to apixaban and does not indicate therapeutic anti-coagulation)  Will continue to follow.   Makenleigh Crownover,MD 9:43 AM

## 2014-03-06 NOTE — Progress Notes (Signed)
Dr. Joya Gaskins notified of continued slow bloody ooze and right IJ TLC site. Notified Dr. Joya Gaskins that drsg was changed  with application of thrombi pad and pressure drsg. Will continue to monitor and notify MD with any changes.

## 2014-03-06 NOTE — Progress Notes (Signed)
Blood still oozing from central line insertion site. MD from [place] on camera to assess patient. Pressure held for 26minutes and pressure dressing applied after sterile dressing was applied to site. Will continue to monitor.

## 2014-03-06 NOTE — Progress Notes (Signed)
Called to bedside to assess bleeding central line.  Dressing removed, bleeding from insertion site.  Attempted steri-strips to pull site together without success.  Inserted two additional sutures and tied without cessation of bleeding.  New sutures removed out of concern for central line being clipped upon removal.  New thrombi-pad added to site and pressure dressing applied.     Noe Gens, NP-C [facility] Pgr: [phone] or [phone]

## 2014-03-06 NOTE — Progress Notes (Signed)
PULMONARY / CRITICAL CARE MEDICINE   Name: [name] MRN: [id number] DOB: 1946/02/05    ADMISSION DATE:  03/03/2014 CONSULTATION DATE:  7/02  REFERRING MD :  Fanny Bien PRIMARY SERVICE: IM  CHIEF COMPLAINT:  Shortness of breath and chest pain   BRIEF PATIENT DESCRIPTION: 68 y.o. female with recently diagnosed with A-fib/flutter (apixaban) who [facility] to [facility] with chest pain and shortness of breath after a scheduled cardioversion. Being treated for possible aspiration pneumonia vs pulm edema. 7/02 PCCM consulted for worsening shortness of breath and increasing O2 requirements.   SIGNIFICANT EVENTS / STUDIES:  6/30 admitted with SOB, CP after scheduled cardioversion - CXR positive for possible aspiration PNA vs pulm edema 7/02 PCCM consulted for increased work of breathing and increasing oxygen requirements.   LINES / TUBES: PIVs Central line 7/02 >>>  CULTURES: Blood cultures 7/02 >>>  ANTIBIOTICS: Cefepime 7/1 >>> 7/2 Vancomycin 7/1 >>> Zosyn 7/2 >>>  SUBJECTIVE:  Pt less hypertensive, off bipap, slightly better AM 03/06/14 Bleeding from R IJ CVL 7/3 am  VITAL SIGNS: Temp:  [98.2 F (36.8 C)-102.1 F (38.9 C)] 98.9 F (37.2 C) (07/03 0800) Pulse Rate:  [60-128] 112 (07/03 0800) Resp:  [15-38] 24 (07/03 0800) BP: (99-182)/(47-107) 140/60 mmHg (07/03 0800) SpO2:  [87 %-100 %] 96 % (07/03 0800) FiO2 (%):  [40 %-50 %] 50 % (07/03 0400) HEMODYNAMICS: CVP:  [9 mmHg-12 mmHg] 12 mmHg VENTILATOR SETTINGS: Vent Mode:  [-]  FiO2 (%):  [40 %-50 %] 50 % INTAKE / OUTPUT: Intake/Output     07/02 0701 - 07/03 0700 07/03 0701 - 07/04 0700   P.O. 50    I.V. (mL/kg) 891 (10.3)    IV Piggyback 350    Total Intake(mL/kg) 1291 (14.9)    Urine (mL/kg/hr) 1390 (0.7)    Total Output 1390     Net -99           PHYSICAL EXAMINATION: General: Morbidly obese acutely ill appearing female,no acute distress Neuro: A/Ox4, anxious affect, follows all commands, MAEs HEENT:  PERRL, Venti mask  Cardiovascular: IRIR, questionable pulmonic murmur?  Lungs: Crackles throughout, expiratory wheeze but better BS AM 7/3 Abdomen: Rotund, non-tender Musculoskeletal: no demormity Skin: intact  LABS:  CBC  Recent Labs Lab 03/05/14 0251 03/05/14 1710 03/06/14 0410  WBC 13.2* 15.8* 12.8*  HGB 12.1 12.0 11.1*  HCT 38.0 36.4 34.7*  PLT 245 267 254   Coag's  Recent Labs Lab 03/05/14 2330 03/06/14 0450  APTT  --  48*  INR 2.01* 2.12*   BMET  Recent Labs Lab 03/04/14 0338 03/05/14 0251 03/06/14 0410  NA 137 139 139  K 4.4 3.7 4.0  CL 102 99 98  CO2 19 22 25   BUN 19 21 21   CREATININE 2.14* 2.38* 2.43*  GLUCOSE 217* 262* 241*   Electrolytes  Recent Labs Lab 03/04/14 0338 03/05/14 0251 03/05/14 1710 03/06/14 0410  CALCIUM 8.7 9.1  --  8.9  MG  --   --  2.1  --   PHOS  --   --  1.5*  --    Sepsis Markers  Recent Labs Lab 03/03/14 1851 03/05/14 0251 03/05/14 1710  LATICACIDVEN  --   --  1.6  PROCALCITON <0.10 0.11  --    ABG  Recent Labs Lab 03/05/14 1427  PHART 7.468*  PCO2ART 33.2*  PO2ART 56.0*   Liver Enzymes  Recent Labs Lab 03/04/14 0338  AST 14  ALT 7  ALKPHOS 87  BILITOT  0.5  ALBUMIN 2.9*   Cardiac Enzymes  Recent Labs Lab 03/03/14 1851 03/03/14 2255 03/04/14 0338 03/04/14 1040  TROPONINI <0.30 <0.30 <0.30 <0.30  PROBNP 1531.0*  --   --   --    Glucose  Recent Labs Lab 03/04/14 2150 03/05/14 0811 03/05/14 1212 03/05/14 1818 03/05/14 2111 03/06/14 0755  GLUCAP 229* 267* 245* 223* 243* 257*    Imaging All imaging in epic system reviewed past 48hrs  ASSESSMENT / PLAN: Principal Problem:   Acute respiratory failure Active Problems:   Obstructive sleep apnea   DM type 2, uncontrolled, with renal complications   Chest pain   Atrial fibrillation with RVR   Aspiration pneumonia   Acute diastolic heart failure   Acute pulmonary edema   Coagulopathy   PULMONARY A: Acute respiratory  failure d/t possible aspiration pna?(after cardioversion) Vs pulm edema - Increasing O2 needs - stable on 40% venti mask but at risk for possible decompensation Respiratory alkalosis Pulm edema H/o sleep apnea P:    Cont lasix Trial off bipap F/u cxr BDs Oxygen titration, on VM oxygen  CARDIOVASCULAR A:  Atrial fibrillation - on Cardizem gtt and amiodarone gtt Hypertension H/o diastolic heart failure P:  Cards is following Ct cardizem and amiodarone Low dose metoprolol PRN for HR > 120 Echo f/u   RENAL A:   CKD - Serum creatinine baseline 2.1 - 2.9 - possibly d/t aggressive diuretics , unclear, also d/t DM 2 hypophosphatemia P:   Ct Lasix unless CVP <12 (goal CVP per cards)  repleat  phosphoruous Sup electrolytes as needed  GASTROINTESTINAL A:   H/o GERD (barretts esophagus) - questionable contribution to aspiration? IBS P:   Change to po PPI Hold home bentyl for now - given possible CHF exacerbation / HTN. Ok to advance diet   HEMATOLOGIC A: Bleeding from R IJ CVL INR > 2.0  Coagulopathy, on apixaban at home with renal dz, note Cr >2.0 and on 5mg  bid apixaban, needs lower dose 2.5mg  bid when resumed P:  F/u CBC Hold eliquis for now with CVL bleeding ,note INR >2  INFECTIOUS A:   Possible aspiration pneumonia Leukocytosis Febrile Lactic acid normal. No evident septic shock P:   Ct Vanc/Zosyn Monitor WBCs and fever curve  ENDOCRINE A:   Diabetes 2 with renal complication P:   SSI F/u cbgs  NEUROLOGIC A:   H/o Depression H/o insomina Chronic pain? P:   Hold home amitriptyline, ambien Morphine PRN   RASS goal: 0  TODAY'S SUMMARY: Patient transferred to the ICU and care transferred to PCCM d/t increasing work of breathing and increasing O2 requirements. Pt had aspiration pna, pulm edema s/p DCCV.  Pt coagulopathic and bleeding from cvl. Needs more diuresis, watch renals, monitor cvp, will suture cvl and inject lidocaine/epi at site.   CC  time 22min Glyn Ade  [id number]  Cell  [phone]  If no response or cell goes to voicemail, call beeper [phone]  [facility] and [place] Pager: [phone]  8:35 AM 03/06/2014  03/06/2014, 8:35 AM

## 2014-03-07 ENCOUNTER — Inpatient Hospital Stay (HOSPITAL_COMMUNITY): Payer: Medicare HMO

## 2014-03-07 LAB — CBC WITH DIFFERENTIAL/PLATELET
Basophils Absolute: 0 10*3/uL (ref 0.0–0.1)
Basophils Relative: 0 % (ref 0–1)
Eosinophils Absolute: 0.6 10*3/uL (ref 0.0–0.7)
Eosinophils Relative: 5 % (ref 0–5)
HCT: 30.6 % — ABNORMAL LOW (ref 36.0–46.0)
Hemoglobin: 9.8 g/dL — ABNORMAL LOW (ref 12.0–15.0)
Lymphocytes Relative: 15 % (ref 12–46)
Lymphs Abs: 1.8 10*3/uL (ref 0.7–4.0)
MCH: 29.4 pg (ref 26.0–34.0)
MCHC: 32 g/dL (ref 30.0–36.0)
MCV: 91.9 fL (ref 78.0–100.0)
Monocytes Absolute: 1 10*3/uL (ref 0.1–1.0)
Monocytes Relative: 9 % (ref 3–12)
Neutro Abs: 8.1 10*3/uL — ABNORMAL HIGH (ref 1.7–7.7)
Neutrophils Relative %: 71 % (ref 43–77)
Platelets: 260 10*3/uL (ref 150–400)
RBC: 3.33 MIL/uL — ABNORMAL LOW (ref 3.87–5.11)
RDW: 15 % (ref 11.5–15.5)
WBC: 11.4 10*3/uL — ABNORMAL HIGH (ref 4.0–10.5)

## 2014-03-07 LAB — GLUCOSE, CAPILLARY
Glucose-Capillary: 204 mg/dL — ABNORMAL HIGH (ref 70–99)
Glucose-Capillary: 221 mg/dL — ABNORMAL HIGH (ref 70–99)
Glucose-Capillary: 69 mg/dL — ABNORMAL LOW (ref 70–99)
Glucose-Capillary: 73 mg/dL (ref 70–99)
Glucose-Capillary: 199 mg/dL — ABNORMAL HIGH (ref 70–99)

## 2014-03-07 LAB — BASIC METABOLIC PANEL
Anion gap: 14 (ref 5–15)
BUN: 21 mg/dL (ref 6–23)
CO2: 24 mEq/L (ref 19–32)
Calcium: 8 mg/dL — ABNORMAL LOW (ref 8.4–10.5)
Chloride: 104 mEq/L (ref 96–112)
Creatinine, Ser: 2.43 mg/dL — ABNORMAL HIGH (ref 0.50–1.10)
GFR calc Af Amer: 23 mL/min — ABNORMAL LOW (ref >90)
GFR calc non Af Amer: 19 mL/min — ABNORMAL LOW (ref >90)
Glucose, Bld: 163 mg/dL — ABNORMAL HIGH (ref 70–99)
Potassium: 3.4 mEq/L — ABNORMAL LOW (ref 3.7–5.3)
Sodium: 142 mEq/L (ref 137–147)

## 2014-03-07 LAB — PROTIME-INR
INR: 2.06 — ABNORMAL HIGH (ref 0.00–1.49)
Prothrombin Time: 23.2 seconds — ABNORMAL HIGH (ref 11.6–15.2)

## 2014-03-07 MED ORDER — POTASSIUM CHLORIDE CRYS ER 20 MEQ PO TBCR
40.0000 meq | EXTENDED_RELEASE_TABLET | Freq: Once | ORAL | Status: AC
  Administered 2014-03-07: 40 meq via ORAL

## 2014-03-07 MED ORDER — FUROSEMIDE 10 MG/ML IJ SOLN
80.0000 mg | Freq: Two times a day (BID) | INTRAMUSCULAR | Status: AC
  Administered 2014-03-07 (×2): 80 mg via INTRAVENOUS

## 2014-03-07 MED ORDER — POTASSIUM CHLORIDE CRYS ER 20 MEQ PO TBCR
40.0000 meq | EXTENDED_RELEASE_TABLET | Freq: Once | ORAL | Status: DC
  Filled 2014-03-07: qty 2

## 2014-03-07 MED ORDER — FUROSEMIDE 10 MG/ML IJ SOLN: 80.0000 mg | Freq: Every day | INTRAMUSCULAR | Status: DC

## 2014-03-07 MED ORDER — "THROMBI-PAD 3""X3"" EX PADS"
1.0000 | MEDICATED_PAD | Freq: Once | CUTANEOUS | Status: AC
  Administered 2014-03-07: 1 via TOPICAL
  Filled 2014-03-07: qty 1

## 2014-03-07 MED ORDER — GELATIN ABSORBABLE 12-7 MM EX MISC
1.0000 | Freq: Once | CUTANEOUS | Status: AC
  Administered 2014-03-07: 1 via TOPICAL
  Filled 2014-03-07: qty 1

## 2014-03-07 MED ORDER — DESMOPRESSIN ACETATE 4 MCG/ML IJ SOLN
20.0000 ug | Freq: Once | INTRAMUSCULAR | Status: AC
Start: 2014-03-07 — End: 2014-03-07
  Administered 2014-03-07: 20 ug via INTRAVENOUS
  Filled 2014-03-07 (×2): qty 5

## 2014-03-07 NOTE — Progress Notes (Signed)
S: Patient having persistent oozing of blood, from Central line site. Doesn't fel better.   O:BP 136/83  Pulse 102  Temp(Src) 98.7 F (37.1 C) (Oral)  Resp 24  Ht 5\' 4"  (1.626 m)  Wt 232 lb 12.9 oz (105.6 kg)  BMI 39.94 kg/m2  SpO2 94%  Intake/Output Summary (Last 24 hours) at 03/07/14 1121 Last data filed at 03/07/14 0900  Gross per 24 hour  Intake 1465.67 ml  Output   1080 ml  Net 385.67 ml   Intake/Output: I/O last 3 completed shifts: In: 2833.3 [P.O.:240; I.V.:1886.6; IV Piggyback:706.7] Out: 1810 [Urine:1810]  Intake/Output this shift:  Total I/O In: -  Out: 150 [Urine:150] Weight change:  Gen: Appear anxious, on venti mask.  HEENT: Appear dehydrated with dry oral mucosa, EOMI, Neck supple. [id number], no added sounds. Resp: Rhonchorus breath sounds - Left lung zones Abd: Full, soft, bowel sounds heard Ext: No pedal edema, warm and well perfused. Neuro- Alert and oriented, moving all extremities.  Recent Labs Lab 03/03/14 0908 03/03/14 1851 03/04/14 0338 03/05/14 0251 03/05/14 1710 03/06/14 0410 03/07/14 0400  NA 143 142 137 139  --  139 142  K 3.8 4.0 4.4 3.7  --  4.0 3.4*  CL  --  104 102 99  --  98 104  CO2  --  21 19 22   --  25 24  GLUCOSE 81 165* 217* 262*  --  241* 163*  BUN  --  18 19 21   --  21 21  CREATININE  --  2.14* 2.14* 2.38*  --  2.43* 2.43*  ALBUMIN  --   --  2.9*  --   --   --   --   CALCIUM  --  9.4 8.7 9.1  --  8.9 8.0*  PHOS  --   --   --   --  1.5*  --   --   AST  --   --  14  --   --   --   --   ALT  --   --  7  --   --   --   --    Liver Function Tests:  Recent Labs Lab 03/04/14 0338  AST 14  ALT 7  ALKPHOS 87  BILITOT 0.5  PROT 6.7  ALBUMIN 2.9*   CBC:  Recent Labs Lab 03/04/14 0855 03/05/14 0251 03/05/14 1710 03/06/14 0410 03/07/14 0400  WBC 9.9 13.2* 15.8* 12.8* 11.4*  NEUTROABS 7.8*  --   --   --  8.1*  HGB 12.7 12.1 12.0 11.1* 9.8*  HCT 39.7 38.0 36.4 34.7* 30.6*  MCV 94.3 93.4 91.5 92.3 91.9   PLT 225 245 267 254 260   Cardiac Enzymes:  Recent Labs Lab 03/03/14 1851 03/03/14 2255 03/04/14 0338 03/04/14 1040  TROPONINI <0.30 <0.30 <0.30 <0.30   CBG:  Recent Labs Lab 03/06/14 0755 03/06/14 1223 03/06/14 1654 03/06/14 2138 03/07/14 0748  GLUCAP 257* 301* 203* 163* 204*   Studies/Results: Dg Chest Port 1 View  03/07/2014   CLINICAL DATA:  Aspiration pneumonia.  Pulmonary edema.  EXAM: PORTABLE CHEST - 1 VIEW  COMPARISON:  03/05/2014.  FINDINGS: Stable enlarged cardiac silhouette. Stable right jugular catheter. Mildly increased airspace opacity in the right mid lung zone. No significant change in left lung airspace opacities and diffusely prominent interstitial markings.  IMPRESSION: 1. Mildly worsened probable pneumonia in the right mid lung zone. 2. Stable probable pneumonia in the left lung. 3. Stable cardiomegaly and interstitial  lung disease.   Electronically Signed   By: Enrique Sack M.D.   On: 03/07/2014 07:21   Dg Chest Port 1 View  03/05/2014   CLINICAL DATA:  Evaluate line placement  EXAM: PORTABLE CHEST - 1 VIEW  COMPARISON:  03/05/2014 at 1418 hr  FINDINGS: Multifocal patchy opacities, left lung predominant, mildly improved. The rapid change favors interstitial edema, although underlying multifocal pneumonia remains possible.  No pleural effusion or pneumothorax.  Cardiomegaly.  Right IJ venous catheter terminates at the cavoatrial junction.  IMPRESSION: Right IJ venous catheter terminates at the cavoatrial junction. No pneumothorax.  Multifocal patchy opacities, left lung predominant, mildly improved. This appearance favors interstitial edema, although underlying multifocal pneumonia remains possible.   Electronically Signed   By: Julian Hy M.D.   On: 03/05/2014 17:14   Dg Chest Port 1 View  03/05/2014   CLINICAL DATA:  Short of breath  EXAM: PORTABLE CHEST - 1 VIEW  COMPARISON:  03/04/2014  FINDINGS: Interval progression of advance diffuse bilateral airspace  disease. No significant effusion.  IMPRESSION: Diffuse bilateral airspace disease has progressed since yesterday. This most likely is edema however underlying infection could be present.   Electronically Signed   By: Franchot Gallo M.D.   On: 03/05/2014 14:35   . albuterol  2.5 mg Inhalation Q6H  . amitriptyline  25 mg Oral QHS  . antiseptic oral rinse  15 mL Mouth Rinse BID  . apixaban  5 mg Oral BID  . atorvastatin  20 mg Oral q1800  . cloNIDine  0.1 mg Oral 3 times per day  . diltiazem  10 mg Intravenous Once  . febuxostat  40 mg Oral Daily  . feeding supplement (ENSURE COMPLETE)  237 mL Oral BID BM  . furosemide  80 mg Intravenous Q12H  . insulin aspart  0-9 Units Subcutaneous TID WC  . insulin aspart  20 Units Subcutaneous TID PC  . insulin glargine  45 Units Subcutaneous Daily  . Linaclotide  290 mcg Oral Daily  . metoprolol  50 mg Oral BID  . pantoprazole  40 mg Oral Q1200  . piperacillin-tazobactam (ZOSYN)  IV  3.375 g Intravenous 3 times per day  . potassium chloride  40 mEq Oral Once  . sodium chloride  3 mL Intravenous Q12H    BMET    Component Value Date/Time   NA 142 03/07/2014 0400   K 3.4* 03/07/2014 0400   CL 104 03/07/2014 0400   CO2 24 03/07/2014 0400   GLUCOSE 163* 03/07/2014 0400   BUN 21 03/07/2014 0400   CREATININE 2.43* 03/07/2014 0400   CALCIUM 8.0* 03/07/2014 0400   GFRNONAA 19* 03/07/2014 0400   GFRAA 23* 03/07/2014 0400   CBC    Component Value Date/Time   WBC 11.4* 03/07/2014 0400   RBC 3.33* 03/07/2014 0400   HGB 9.8* 03/07/2014 0400   HCT 30.6* 03/07/2014 0400   PLT 260 03/07/2014 0400   MCV 91.9 03/07/2014 0400   MCH 29.4 03/07/2014 0400   MCHC 32.0 03/07/2014 0400   RDW 15.0 03/07/2014 0400   LYMPHSABS 1.8 03/07/2014 0400   MONOABS 1.0 03/07/2014 0400   EOSABS 0.6 03/07/2014 0400   BASOSABS 0.0 03/07/2014 0400     Assessment/Plan:  1. Acute respiratory distress- Aetiology- PNA, ? Aspiration , also ? Pulm edema.  Now on antibiotics- Vanc and Zosyn. ? Pulm edema, but no  weight change. IV lasix dose increased to 80 BID. Repeat chest xray this morning- Left Stable left PNA,  mildly worsened PNA right mid lung zone. On Vanc and zosyn. Cultures no growth. Leukocytosis improving. Weight today- 232, 190 03/04/2014, weights inconsistent. - HIV screen.  2. CKD4- Cr today-2.43 appears stable. Good urine output- 1,195.   3. DM- Per primary team.   4. HTN- Blood pressure WNL, management per Primary team.   5. Atria fib- Likely driven by lung infection. Management per Cards. On Metop, Amiodarone and diltiazem.  6. Anemia- ?oozing from central line site.  Emokpae, Ejiroghene IMTS PGY-1 I have seen and examined this patient and agree with plan as outlined by Dr. Denton Brick.  Scr will likely increase with more aggressive diuresis given renal hemodynamics and PNA.  Will follow UOP and daily Scr.   She continues to ooze from the [facility] will order gelfoam but may benefit from Aptos. Nilah Belcourt A,MD 03/07/2014 12:46 PM

## 2014-03-07 NOTE — Progress Notes (Signed)
Advanced Heart Failure Rounding Note   Subjective:    Breathing continues to improve. Renal function stable. Tm = 100.4  Echo with EF 60-65%. RV ok.   Remains in AF. Rate 95-115 on Amio and diltiazem.  Eliquis stopped for 1 dose due to bleeding at CVL site but now resumed.  CVL site still oozing   Objective:   Weight Range:  Vital Signs:   Temp:  [98.3 F (36.8 C)-100.4 F (38 C)] 98.3 F (36.8 C) (07/04 0000) Pulse Rate:  [75-136] 87 (07/04 0238) Resp:  [17-53] 21 (07/04 0238) BP: (112-163)/(39-114) 135/69 mmHg (07/04 0000) SpO2:  [90 %-98 %] 96 % (07/04 0238) FiO2 (%):  [30 %-50 %] 40 % (07/04 0233)    Weight change: Filed Weights   03/03/14 1754 03/04/14 0500 03/05/14 0330  Weight: 104.327 kg (230 lb) 86.8 kg (191 lb 5.8 oz) 86.4 kg (190 lb 7.6 oz)    Intake/Output:   Intake/Output Summary (Last 24 hours) at 03/07/14 0514 Last data filed at 03/07/14 0200  Gross per 24 hour  Intake 2063.47 ml  Output   1395 ml  Net 668.47 ml    Physical Exam: CVP 10-12 General: Obese woman lying flat in bed on face mask . More comfortable HEENT: normal  Neck: supple. JVP hard to see Carotids 2+ bilat; no bruits. No lymphadenopathy or thryomegaly appreciated. RIJ TLC with ooze Cor: PMI nonpalpbale. Distant. Irregular. No obvious murmur  Lungs:  clear Abdomen: Obese soft, nontender,. No hepatosplenomegaly. No bruits or masses. Good bowel sounds.  Extremities: no cyanosis, clubbing, rash, warm. No edema Neuro: alert & orientedx3, cranial nerves grossly intact. moves all 4 extremities w/o difficulty. Affect pleasant    Telemetry: A fib 80s  Labs: Basic Metabolic Panel:  Recent Labs Lab 03/03/14 1851 03/04/14 0338 03/05/14 0251 03/05/14 1710 03/06/14 0410 03/07/14 0400  NA 142 137 139  --  139 142  K 4.0 4.4 3.7  --  4.0 3.4*  CL 104 102 99  --  98 104  CO2 21 19 22   --  25 24  GLUCOSE 165* 217* 262*  --  241* 163*  BUN 18 19 21   --  21 21  CREATININE 2.14* 2.14*  2.38*  --  2.43* 2.43*  CALCIUM 9.4 8.7 9.1  --  8.9 8.0*  MG  --   --   --  2.1  --   --   PHOS  --   --   --  1.5*  --   --     Liver Function Tests:  Recent Labs Lab 03/04/14 0338  AST 14  ALT 7  ALKPHOS 87  BILITOT 0.5  PROT 6.7  ALBUMIN 2.9*   No results found for this basename: LIPASE, AMYLASE,  in the last 168 hours No results found for this basename: AMMONIA,  in the last 168 hours  CBC:  Recent Labs Lab 03/04/14 0855 03/05/14 0251 03/05/14 1710 03/06/14 0410 03/07/14 0400  WBC 9.9 13.2* 15.8* 12.8* 11.4*  NEUTROABS 7.8*  --   --   --  8.1*  HGB 12.7 12.1 12.0 11.1* 9.8*  HCT 39.7 38.0 36.4 34.7* 30.6*  MCV 94.3 93.4 91.5 92.3 91.9  PLT 225 245 267 254 260    Cardiac Enzymes:  Recent Labs Lab 03/03/14 1851 03/03/14 2255 03/04/14 0338 03/04/14 1040  TROPONINI <0.30 <0.30 <0.30 <0.30    BNP: BNP (last 3 results)  Recent Labs  03/03/14 1851  PROBNP 1531.0*  Other results:    Imaging: Dg Chest Port 1 View  03/05/2014   CLINICAL DATA:  Evaluate line placement  EXAM: PORTABLE CHEST - 1 VIEW  COMPARISON:  03/05/2014 at 1418 hr  FINDINGS: Multifocal patchy opacities, left lung predominant, mildly improved. The rapid change favors interstitial edema, although underlying multifocal pneumonia remains possible.  No pleural effusion or pneumothorax.  Cardiomegaly.  Right IJ venous catheter terminates at the cavoatrial junction.  IMPRESSION: Right IJ venous catheter terminates at the cavoatrial junction. No pneumothorax.  Multifocal patchy opacities, left lung predominant, mildly improved. This appearance favors interstitial edema, although underlying multifocal pneumonia remains possible.   Electronically Signed   By: Julian Hy M.D.   On: 03/05/2014 17:14   Dg Chest Port 1 View  03/05/2014   CLINICAL DATA:  Short of breath  EXAM: PORTABLE CHEST - 1 VIEW  COMPARISON:  03/04/2014  FINDINGS: Interval progression of advance diffuse bilateral  airspace disease. No significant effusion.  IMPRESSION: Diffuse bilateral airspace disease has progressed since yesterday. This most likely is edema however underlying infection could be present.   Electronically Signed   By: Franchot Gallo M.D.   On: 03/05/2014 14:35     Medications:     Scheduled Medications: . albuterol  2.5 mg Inhalation Q6H  . amitriptyline  25 mg Oral QHS  . antiseptic oral rinse  15 mL Mouth Rinse BID  . apixaban  5 mg Oral BID  . atorvastatin  20 mg Oral q1800  . cloNIDine  0.1 mg Oral 3 times per day  . diltiazem  10 mg Intravenous Once  . febuxostat  40 mg Oral Daily  . feeding supplement (ENSURE COMPLETE)  237 mL Oral BID BM  . insulin aspart  0-9 Units Subcutaneous TID WC  . insulin aspart  20 Units Subcutaneous TID PC  . insulin glargine  45 Units Subcutaneous Daily  . Linaclotide  290 mcg Oral Daily  . metoprolol  50 mg Oral BID  . pantoprazole  40 mg Oral Q1200  . piperacillin-tazobactam (ZOSYN)  IV  3.375 g Intravenous 3 times per day  . sodium chloride  3 mL Intravenous Q12H  . vancomycin  1,000 mg Intravenous Q24H    Infusions: . sodium chloride 5 mL/hr at 03/05/14 1934  . sodium chloride 5 mL/hr at 03/05/14 1934  . sodium chloride Stopped (03/05/14 2000)  . amiodarone 30 mg/hr (03/06/14 1751)  . diltiazem (CARDIZEM) infusion 20 mg/hr (03/07/14 0300)    PRN Medications: acetaminophen, acetaminophen, HYDROcodone-homatropine, methocarbamol, morphine injection, ondansetron (ZOFRAN) IV, ondansetron, oxyCODONE-acetaminophen, zolpidem   Assessment:  1. Acute respiratory failure  2. A/c chronic diastolic HF  3. Probable aspiration PNA  4. Chronic renal failure, stage IV  5. Obesity  6. AF s/p DC-CV on 6/30  7. HTN  8. DM2   Plan/Discussion:    She continues to improve. CVP 10-12  range. Renal function now stable. Will give lasix 80 IV daily.   Likely aspiration PNA. Improving with vanc/zosyn  She is back in AF after recent DC-CV.  Agree with amio and diltiazem. Now with good rate control. Will not re-cardiovert at this time as I suspect apixaban may need to be held again with oozing from CV line site. I held pressure personally for 15 mins at site.   Will continue to follow.   Daniel Bensimhon,MD 5:14 AM

## 2014-03-07 NOTE — Progress Notes (Signed)
eLink Physician-Brief Progress Note Patient Name: [name] DOB: 1946-06-12 MRN: [id number]  Date of Service  03/07/2014   HPI/Events of Note  Hypokalemia   eICU Interventions  Potassium replaced   Intervention Category Minor Interventions: Electrolytes abnormality - evaluation and management  Karem Tomaso 03/07/2014, 5:35 AM

## 2014-03-07 NOTE — Progress Notes (Addendum)
PULMONARY / CRITICAL CARE MEDICINE   Name: [name] MRN: [id number] DOB: Nov 05, 1945    ADMISSION DATE:  03/03/2014 CONSULTATION DATE:  7/02  REFERRING MD :  Fanny Bien PRIMARY SERVICE: IM  CHIEF COMPLAINT:  Shortness of breath and chest pain   BRIEF PATIENT DESCRIPTION: 68 y.o. female with recently diagnosed with A-fib/flutter (apixaban) who [facility] with chest pain and shortness of breath after a scheduled cardioversion. Being treated for possible aspiration pneumonia vs pulm edema. 7/02 PCCM consulted for worsening shortness of breath and increasing O2 requirements.   SIGNIFICANT EVENTS / STUDIES:  6/30 admitted with SOB, CP after scheduled cardioversion - CXR positive for possible aspiration PNA vs pulm edema 7/02 PCCM consulted for increased work of breathing and increasing oxygen requirements.   LINES / TUBES: PIVs Central line 7/02 >>>  CULTURES: Blood cultures 7/02 >>>neg  ANTIBIOTICS: Cefepime 7/1 >>> 7/2 Vancomycin 7/1 >>>7/4 Zosyn 7/2 >>>  SUBJECTIVE:  On and off bipap, still oozing from cvl site. Did not diurese 7/3. Cr stable. Cards says INR falsely elevated d/t apixaban. apixaban needed with recent DCCV.  VITAL SIGNS: Temp:  [98.3 F (36.8 C)-100.4 F (38 C)] 98.7 F (37.1 C) (07/04 0746) Pulse Rate:  [62-147] 111 (07/04 0746) Resp:  [16-53] 19 (07/04 0746) BP: (114-163)/(39-114) 131/63 mmHg (07/04 0746) SpO2:  [90 %-98 %] 93 % (07/04 0749) FiO2 (%):  [30 %-40 %] 30 % (07/04 0749) Weight:  [105.1 kg (231 lb 11.3 oz)-105.6 kg (232 lb 12.9 oz)] 105.6 kg (232 lb 12.9 oz) (07/04 0619) HEMODYNAMICS: CVP:  [6 mmHg-11 mmHg] 9 mmHg VENTILATOR SETTINGS: Vent Mode:  [-]  FiO2 (%):  [30 %-40 %] 30 % INTAKE / OUTPUT: Intake/Output     07/03 0701 - 07/04 0700 07/04 0701 - 07/05 0700   P.O. 240    I.V. (mL/kg) 1331.9 (12.6)    IV Piggyback 356.7    Total Intake(mL/kg) 1928.6 (18.3)    Urine (mL/kg/hr) 1195 (0.5)    Total Output 1195      Net +733.6           PHYSICAL EXAMINATION: General: Morbidly obese acutely ill appearing female,no acute distress Neuro: A/Ox4, anxious affect, follows all commands, MAEs HEENT: PERRL, Venti mask , oozing from CVL site  Cardiovascular: IRIR, distant heart tones  Lungs:scattered rhonchi  Abdomen: Rotund, non-tender Musculoskeletal: no demormity Skin: intact  LABS:  CBC  Recent Labs Lab 03/05/14 1710 03/06/14 0410 03/07/14 0400  WBC 15.8* 12.8* 11.4*  HGB 12.0 11.1* 9.8*  HCT 36.4 34.7* 30.6*  PLT 267 254 260   Coag's  Recent Labs Lab 03/05/14 2330 03/06/14 0450 03/07/14 0400  APTT  --  48*  --   INR 2.01* 2.12* 2.06*   BMET  Recent Labs Lab 03/05/14 0251 03/06/14 0410 03/07/14 0400  NA 139 139 142  K 3.7 4.0 3.4*  CL 99 98 104  CO2 22 25 24   BUN 21 21 21   CREATININE 2.38* 2.43* 2.43*  GLUCOSE 262* 241* 163*   Electrolytes  Recent Labs Lab 03/05/14 0251 03/05/14 1710 03/06/14 0410 03/07/14 0400  CALCIUM 9.1  --  8.9 8.0*  MG  --  2.1  --   --   PHOS  --  1.5*  --   --    Sepsis Markers  Recent Labs Lab 03/03/14 1851 03/05/14 0251 03/05/14 1710  LATICACIDVEN  --   --  1.6  PROCALCITON <0.10 0.11  --    ABG  Recent Labs Lab 03/05/14 1427  PHART 7.468*  PCO2ART 33.2*  PO2ART 56.0*   Liver Enzymes  Recent Labs Lab 03/04/14 0338  AST 14  ALT 7  ALKPHOS 87  BILITOT 0.5  ALBUMIN 2.9*   Cardiac Enzymes  Recent Labs Lab 03/03/14 1851 03/03/14 2255 03/04/14 0338 03/04/14 1040  TROPONINI <0.30 <0.30 <0.30 <0.30  PROBNP 1531.0*  --   --   --    Glucose  Recent Labs Lab 03/05/14 2111 03/06/14 0755 03/06/14 1223 03/06/14 1654 03/06/14 2138 03/07/14 0748  GLUCAP 243* 257* 301* 203* 163* 204*    Imaging All imaging in epic system reviewed past 48hrs cxr 7/4: worsening bilateral asp pna vs edema  ASSESSMENT / PLAN: Principal Problem:   Acute respiratory failure Active Problems:   Obstructive sleep apnea    Chronic kidney disease (CKD), stage IV (severe)   DM (diabetes mellitus), type 2 with renal complications   Chest pain   Atrial fibrillation with RVR   Aspiration pneumonia   Acute diastolic heart failure   Acute pulmonary edema   Coagulopathy   PULMONARY A: Acute respiratory failure d/t aspiration pna (after cardioversion) Vs pulm edema vs both -  Pulm edema H/o sleep apnea P:    Cont lasix at 80mg  bid, need to get lungs dry. Acknowledged renal risk with cr 2.4 and cvp 8 Prn bipap F/u cxr BDs Oxygen titration, on VM oxygen Flutter   CARDIOVASCULAR A:  Atrial fibrillation - on Cardizem gtt and amiodarone gtt Hypertension H/o diastolic heart failure Echo : EF 560-65%  LV stiff.   P:  Cards is following Ct cardizem and amiodarone Low dose metoprolol PRN for HR > 120   RENAL A:   CKD - Serum creatinine baseline 2.1 - 2.9 - possibly d/t aggressive diuretics , unclear, also d/t DM 2 hypophosphatemia P:   Ct Lasix  To get lungs dry  Sup electrolytes as needed  GASTROINTESTINAL A:   H/o GERD (barretts esophagus) - questionable contribution to aspiration? IBS P:   Changed to po PPI 7/3 Hold home bentyl for now - given possible CHF exacerbation / HTN. Do not push pos   HEMATOLOGIC A: Bleeding from R IJ CVL INR > 2.0 cards says is apixaban effect, need for apixaban with recent DCCV, makes pt high stroke risk Coagulopathy, on apixaban at home with renal dz, note Cr >2.0 and on 5mg  bid apixaban, cards says stay on 5mg  bid apixaban for now Give DDAVP once ? Renal plt dysfunction P:  F/u CBC Resumed apixaban 7/3  INFECTIOUS A:    aspiration pneumonia  P:   Ct Zosyn With neg BC, d/c vanco d/t renal issues  Monitor WBCs and fever curve  ENDOCRINE A:   Diabetes 2 with renal complication P:   SSI F/u cbgs  NEUROLOGIC A:   H/o Depression H/o insomina Chronic pain? P:   Hold home amitriptyline, ambien Morphine PRN   RASS goal: 0  TODAY'S SUMMARY:  Pt with asp pna s/p dccv, pulm edema, stiff LV, bleeding from r CVL site, acute on chron resp failure, CKD IV.  High risk need vent.  Cont diuresis, cont apixaban, give ddvap for bleed on neck  CC time 73min Glyn Ade  [phone]  Cell  [phone]  If no response or cell goes to voicemail, call beeper [phone]  Pulmonary and [place] Pager: [phone]  8:22 AM 03/07/2014  03/07/2014, 8:22 AM

## 2014-03-07 NOTE — Progress Notes (Signed)
Pt's CVL site still oozing. Dr Mahalia Longest  held pressure for 15 minutes followed by RN Loma Boston who held  pressure for another 20 minutes. Site still oozes. Pressure dressing was applied. Md made aware. Will continue to monitor

## 2014-03-08 ENCOUNTER — Inpatient Hospital Stay (HOSPITAL_COMMUNITY): Payer: Medicare HMO

## 2014-03-08 DIAGNOSIS — D62 Acute posthemorrhagic anemia: Secondary | ICD-10-CM

## 2014-03-08 LAB — CBC WITH DIFFERENTIAL/PLATELET
Basophils Absolute: 0 10*3/uL (ref 0.0–0.1)
Basophils Relative: 0 % (ref 0–1)
Eosinophils Absolute: 1 10*3/uL — ABNORMAL HIGH (ref 0.0–0.7)
Eosinophils Relative: 9 % — ABNORMAL HIGH (ref 0–5)
HCT: 27.4 % — ABNORMAL LOW (ref 36.0–46.0)
Hemoglobin: 9.1 g/dL — ABNORMAL LOW (ref 12.0–15.0)
Lymphocytes Relative: 21 % (ref 12–46)
Lymphs Abs: 2.3 10*3/uL (ref 0.7–4.0)
MCH: 30.4 pg (ref 26.0–34.0)
MCHC: 33.2 g/dL (ref 30.0–36.0)
MCV: 91.6 fL (ref 78.0–100.0)
Monocytes Absolute: 1 10*3/uL (ref 0.1–1.0)
Monocytes Relative: 9 % (ref 3–12)
Neutro Abs: 6.4 10*3/uL (ref 1.7–7.7)
Neutrophils Relative %: 61 % (ref 43–77)
Platelets: 267 10*3/uL (ref 150–400)
RBC: 2.99 MIL/uL — ABNORMAL LOW (ref 3.87–5.11)
RDW: 15.1 % (ref 11.5–15.5)
WBC: 10.7 10*3/uL — ABNORMAL HIGH (ref 4.0–10.5)

## 2014-03-08 LAB — GLUCOSE, CAPILLARY
Glucose-Capillary: 154 mg/dL — ABNORMAL HIGH (ref 70–99)
Glucose-Capillary: 174 mg/dL — ABNORMAL HIGH (ref 70–99)
Glucose-Capillary: 75 mg/dL (ref 70–99)

## 2014-03-08 LAB — BASIC METABOLIC PANEL
Anion gap: 14 (ref 5–15)
BUN: 20 mg/dL (ref 6–23)
CO2: 26 mEq/L (ref 19–32)
Calcium: 8.5 mg/dL (ref 8.4–10.5)
Chloride: 99 mEq/L (ref 96–112)
Creatinine, Ser: 2.58 mg/dL — ABNORMAL HIGH (ref 0.50–1.10)
GFR calc Af Amer: 21 mL/min — ABNORMAL LOW (ref >90)
GFR calc non Af Amer: 18 mL/min — ABNORMAL LOW (ref >90)
Glucose, Bld: 163 mg/dL — ABNORMAL HIGH (ref 70–99)
Potassium: 3.5 mEq/L — ABNORMAL LOW (ref 3.7–5.3)
Sodium: 139 mEq/L (ref 137–147)

## 2014-03-08 LAB — HIV ANTIBODY (ROUTINE TESTING W REFLEX): HIV 1&2 Ab, 4th Generation: NONREACTIVE

## 2014-03-08 MED ORDER — POTASSIUM CHLORIDE CRYS ER 20 MEQ PO TBCR
40.0000 meq | EXTENDED_RELEASE_TABLET | Freq: Once | ORAL | Status: AC
  Administered 2014-03-08: 40 meq via ORAL
  Filled 2014-03-08: qty 2

## 2014-03-08 MED ORDER — FUROSEMIDE 10 MG/ML IJ SOLN: 80.0000 mg | Freq: Once | INTRAMUSCULAR | Status: DC

## 2014-03-08 MED ORDER — DILTIAZEM HCL 60 MG PO TABS
60.0000 mg | ORAL_TABLET | Freq: Four times a day (QID) | ORAL | Status: DC
  Administered 2014-03-08 – 2014-03-10 (×8): 60 mg via ORAL
  Filled 2014-03-08 (×13): qty 1

## 2014-03-08 MED ORDER — AMIODARONE HCL 200 MG PO TABS
400.0000 mg | ORAL_TABLET | Freq: Two times a day (BID) | ORAL | Status: DC
  Administered 2014-03-08 – 2014-03-10 (×5): 400 mg via ORAL
  Filled 2014-03-08 (×6): qty 2

## 2014-03-08 MED ORDER — LEVALBUTEROL HCL 0.63 MG/3ML IN NEBU
0.6300 mg | INHALATION_SOLUTION | Freq: Four times a day (QID) | RESPIRATORY_TRACT | Status: DC
  Administered 2014-03-08 – 2014-03-12 (×15): 0.63 mg via RESPIRATORY_TRACT
  Filled 2014-03-08 (×31): qty 3

## 2014-03-08 NOTE — Progress Notes (Signed)
ANTIBIOTIC CONSULT NOTE - FOLLOW UP  Pharmacy Consult for Zosyn Indication: aspiration pneumonia  Allergies  Allergen Reactions  . Codeine Nausea And Vomiting  . Penicillins Nausea And Vomiting  . Sulfa Antibiotics Itching and Nausea And Vomiting    "everything I seen was red"  . Other Itching    Adhesive from ekg leads    Patient Measurements: Height: 5\' 4"  (162.6 cm) Weight: 235 lb 7.2 oz (106.8 kg) IBW/kg (Calculated) : 54.7  Vital Signs: Temp: 98.3 F (36.8 C) (07/05 0340) Temp src: Oral (07/05 0340) BP: 107/50 mmHg (07/05 0600) Pulse Rate: 49 (07/05 0600) Intake/Output from previous day: 07/04 0701 - 07/05 0700 In: 1434.1 [P.O.:240; I.V.:994.1; IV Piggyback:200] Out: 1125 [Urine:1125] Intake/Output from this shift:    Labs:  Recent Labs  03/06/14 0410 03/07/14 0400 03/08/14 0300  WBC 12.8* 11.4* 10.7*  HGB 11.1* 9.8* 9.1*  PLT 254 260 267  CREATININE 2.43* 2.43* 2.58*   Estimated Creatinine Clearance: 25.2 ml/min (by C-G formula based on Cr of 2.58).  Assessment: 67yof continues on day#4 zosyn for aspiration pneumonia. SCr remains elevated but stable. Dose adjusted for CrCl > 49ml/min.  Vancomycin 6/30>> 7/4 Cefepime 6/30>>7/2 Zosyn 7/2>  7/2 blood x 2>> ngtd  Goal of Therapy:  Appropriate zosyn dosing  Plan:  1) Continue zosyn 3.375g IV q8 (4 hour infusion) 2) Continue to follow renal function, LOT  Deboraha Sprang 03/08/2014,9:16 AM

## 2014-03-08 NOTE — Progress Notes (Signed)
PULMONARY / CRITICAL CARE MEDICINE   Name: [name] MRN: [id number] DOB: Mar 13, 1946    ADMISSION DATE:  03/03/2014 CONSULTATION DATE:  7/02  REFERRING MD :  Fanny Bien PRIMARY SERVICE: IM  CHIEF COMPLAINT:  Shortness of breath and chest pain   BRIEF PATIENT DESCRIPTION: 68 y.o. female with recently diagnosed with A-fib/flutter (apixaban) who presented to [facility] 6/30 with chest pain and shortness of breath after a scheduled cardioversion. Being treated for possible aspiration pneumonia vs pulm edema. 7/02 PCCM consulted for worsening shortness of breath and increasing O2 requirements.   SIGNIFICANT EVENTS / STUDIES:  6/30 admitted with SOB, CP after scheduled cardioversion - CXR positive for possible aspiration PNA vs pulm edema 7/02 PCCM consulted for increased work of breathing and increasing oxygen requirements.   LINES / TUBES: PIVs Central line 7/02 >>>7/5  CULTURES: Blood cultures 7/02 >>>neg  ANTIBIOTICS: Cefepime 7/1 >>> 7/2 Vancomycin 7/1 >>>7/4 Zosyn 7/2 >>>  SUBJECTIVE:  Off bipap. On VM. Did not diurese very much. Still oozing from cvl site and Hgb falling   VITAL SIGNS: Temp:  [98.3 F (36.8 C)-98.9 F (37.2 C)] 98.3 F (36.8 C) (07/05 0340) Pulse Rate:  [42-116] 49 (07/05 0600) Resp:  [14-36] 26 (07/05 0600) BP: (68-161)/(33-102) 107/50 mmHg (07/05 0600) SpO2:  [93 %-100 %] 95 % (07/05 0600) FiO2 (%):  [30 %] 30 % (07/05 0141) Weight:  [106.8 kg (235 lb 7.2 oz)] 106.8 kg (235 lb 7.2 oz) (07/05 0500) HEMODYNAMICS: cv stable , back in AF   VENTILATOR SETTINGS: Vent Mode:  [-]  FiO2 (%):  [30 %] 30 % INTAKE / OUTPUT: Intake/Output     07/04 0701 - 07/05 0700 07/05 0701 - 07/06 0700   P.O. 240    I.V. (mL/kg) 994.1 (9.3)    IV Piggyback 200    Total Intake(mL/kg) 1434.1 (13.4)    Urine (mL/kg/hr) 1125 (0.4)    Total Output 1125     Net +309.1           PHYSICAL EXAMINATION: General: Morbidly obese acutely ill appearing female,no acute  distress Neuro: A/Ox4, anxious affect, follows all commands, MAEs HEENT: PERRL, Venti mask , oozing from CVL site  Cardiovascular: IRIR, distant heart tones  Lungs:scattered rhonchi less Abdomen: Rotund, non-tender Musculoskeletal: no demormity Skin: intact  LABS:  CBC  Recent Labs Lab 03/06/14 0410 03/07/14 0400 03/08/14 0300  WBC 12.8* 11.4* 10.7*  HGB 11.1* 9.8* 9.1*  HCT 34.7* 30.6* 27.4*  PLT 254 260 267   Coag's  Recent Labs Lab 03/05/14 2330 03/06/14 0450 03/07/14 0400  APTT  --  48*  --   INR 2.01* 2.12* 2.06*   BMET  Recent Labs Lab 03/06/14 0410 03/07/14 0400 03/08/14 0300  NA 139 142 139  K 4.0 3.4* 3.5*  CL 98 104 99  CO2 25 24 26   BUN 21 21 20   CREATININE 2.43* 2.43* 2.58*  GLUCOSE 241* 163* 163*   Electrolytes  Recent Labs Lab 03/05/14 1710 03/06/14 0410 03/07/14 0400 03/08/14 0300  CALCIUM  --  8.9 8.0* 8.5  MG 2.1  --   --   --   PHOS 1.5*  --   --   --    Sepsis Markers  Recent Labs Lab 03/03/14 1851 03/05/14 0251 03/05/14 1710  LATICACIDVEN  --   --  1.6  PROCALCITON <0.10 0.11  --    ABG  Recent Labs Lab 03/05/14 1427  PHART 7.468*  PCO2ART 33.2*  PO2ART 56.0*  Liver Enzymes  Recent Labs Lab 03/04/14 0338  AST 14  ALT 7  ALKPHOS 87  BILITOT 0.5  ALBUMIN 2.9*   Cardiac Enzymes  Recent Labs Lab 03/03/14 1851 03/03/14 2255 03/04/14 0338 03/04/14 1040  TROPONINI <0.30 <0.30 <0.30 <0.30  PROBNP 1531.0*  --   --   --    Glucose  Recent Labs Lab 03/06/14 2138 03/07/14 0748 03/07/14 1205 03/07/14 1642 03/07/14 1727 03/07/14 2139  GLUCAP 163* 204* 221* 55* 73 199*    Imaging All imaging in epic system reviewed past 48hrs No film AM 7/5  ASSESSMENT / PLAN: Principal Problem:   Acute respiratory failure Active Problems:   Obstructive sleep apnea   Chronic kidney disease (CKD), stage IV (severe)   DM (diabetes mellitus), type 2 with renal complications   Chest pain   Atrial  fibrillation with RVR   Aspiration pneumonia   Acute diastolic heart failure   Acute pulmonary edema   Coagulopathy   PULMONARY A: Acute respiratory failure d/t aspiration pna (after cardioversion) Vs pulm edema vs both -  Pulm edema less H/o sleep apnea P:    Hold lasix 7/5 Prn bipap F/u cxr BDs change to xopenex in setting of AF Oxygen titration, on VM oxygen Flutter   CARDIOVASCULAR A:  Atrial fibrillation - on Cardizem gtt and amiodarone gtt Hypertension H/o diastolic heart failure Echo : EF 560-65%  LV stiff.   P:  Cards is following Ct cardizem and amiodarone change to PO and get cvl out Low dose metoprolol PRN for HR > 120   RENAL A:   CKD - Serum creatinine baseline 2.1 - 2.9 - possibly d/t aggressive diuretics , unclear, also d/t DM 2 Hypophosphatemia resolved  P:   Hold  Lasix 7/5   Sup electrolytes as needed  GASTROINTESTINAL A:   H/o GERD (barretts esophagus) - questionable contribution to aspiration? IBS P:    PPI  Hold home bentyl for now - given possible CHF exacerbation / HTN. Do not push pos   HEMATOLOGIC A: Bleeding from R IJ CVL Acute blood loss anemia  INR > 2.0 cards says is apixaban effect, need for apixaban with recent DCCV, makes pt high stroke risk Coagulopathy, on apixaban at home with renal dz, note Cr >2.0 and on 5mg  bid apixaban, cards says stay on 5mg  bid apixaban for now Give DDAVP once ? Renal plt dysfunction  Recent Labs Lab 03/05/14 0251 03/05/14 1710 03/06/14 0410 03/07/14 0400 03/08/14 0300  HGB 12.1 12.0 11.1* 9.8* 9.1*    P:  F/u CBC Resumed apixaban 7/3 Pull cvl Transfuse if Hgb falls to 8  INFECTIOUS A:    aspiration pneumonia  Improved WBC and fever curve Note pcn "allergy" only nausea  P:   cont zosyn   ENDOCRINE A:   Diabetes 2 with renal complication P:   SSI F/u cbgs  NEUROLOGIC A:   H/o Depression H/o insomina Chronic pain? P:   Hold home amitriptyline, ambien Morphine PRN    RASS goal: 0  TODAY'S SUMMARY: Pt with asp pna s/p dccv, pulm edema, stiff LV, bleeding from r CVL site, acute on chron resp failure, CKD IV.  Plan to get cvl out, change amio and cardiazem to po, hold diuretics.  CC time 83min Mariel Sleet Beeper  [phone]  Cell  [phone]  If no response or cell goes to voicemail, call beeper [phone]  Pulmonary and [place] Pager: [phone]  8:03 AM 03/08/2014  03/08/2014, 8:03  AM    

## 2014-03-08 NOTE — Progress Notes (Signed)
S: Pt having persistent oozing of blood, from central line site. Feels OK. UOP: 1.2 L yesterday.   O:BP 130/70  Pulse 75  Temp(Src) 98.7 F (37.1 C) (Oral)  Resp 31  Ht 5\' 4"  (1.626 m)  Wt 235 lb 7.2 oz (106.8 kg)  BMI 40.40 kg/m2  SpO2 96%  Intake/Output Summary (Last 24 hours) at 03/08/14 1155 Last data filed at 03/08/14 1000  Gross per 24 hour  Intake 1174.1 ml  Output   1195 ml  Net  -20.9 ml   Intake/Output: I/O last 3 completed shifts: In: 2019.5 [P.O.:240; I.V.:1529.5; IV Piggyback:250] Out: [id number] [Urine:1555]  Intake/Output this shift:  Total I/O In: 166.8 [I.V.:166.8] Out: 220 [Urine:220] Weight change: 3 lb 12 oz (1.7 kg)  Gen: NAD, on 3L [place] saturating at 95% HEENT: [place]/AT [id number], distant heart sounds Resp: Scattered rhonchorus breath sounds  Abd: Full, soft, bowel sounds heard Ext: No pedal edema, warm and well perfused. Neuro: Alert and oriented, moving all extremities.  Recent Labs Lab 03/03/14 0908 03/03/14 1851 03/04/14 0338 03/05/14 0251 03/05/14 1710 03/06/14 0410 03/07/14 0400 03/08/14 0300  NA 143 142 137 139  --  139 142 139  K 3.8 4.0 4.4 3.7  --  4.0 3.4* 3.5*  CL  --  104 102 99  --  98 104 99  CO2  --  21 19 22   --  25 24 26   GLUCOSE 81 165* 217* 262*  --  241* 163* 163*  BUN  --  18 19 21   --  21 21 20   CREATININE  --  2.14* 2.14* 2.38*  --  2.43* 2.43* 2.58*  ALBUMIN  --   --  2.9*  --   --   --   --   --   CALCIUM  --  9.4 8.7 9.1  --  8.9 8.0* 8.5  PHOS  --   --   --   --  1.5*  --   --   --   AST  --   --  14  --   --   --   --   --   ALT  --   --  7  --   --   --   --   --    Liver Function Tests:  Recent Labs Lab 03/04/14 0338  AST 14  ALT 7  ALKPHOS 87  BILITOT 0.5  PROT 6.7  ALBUMIN 2.9*   CBC:  Recent Labs Lab 03/04/14 0855 03/05/14 0251 03/05/14 1710 03/06/14 0410 03/07/14 0400 03/08/14 0300  WBC 9.9 13.2* 15.8* 12.8* 11.4* 10.7*  NEUTROABS 7.8*  --   --   --  8.1* 6.4  HGB 12.7 12.1 12.0  11.1* 9.8* 9.1*  HCT 39.7 38.0 36.4 34.7* 30.6* 27.4*  MCV 94.3 93.4 91.5 92.3 91.9 91.6  PLT 225 245 267 254 260 267   Cardiac Enzymes:  Recent Labs Lab 03/03/14 1851 03/03/14 2255 03/04/14 0338 03/04/14 1040  TROPONINI <0.30 <0.30 <0.30 <0.30   CBG:  Recent Labs Lab 03/07/14 0748 03/07/14 1205 03/07/14 1642 03/07/14 1727 03/07/14 2139  GLUCAP 204* 221* 69* 73 199*   Studies/Results: Dg Chest Port 1 View  03/08/2014   CLINICAL DATA:  Aspiration pneumonia  EXAM: PORTABLE CHEST - 1 VIEW  COMPARISON:  Portable chest x-ray of March 07, 2014  FINDINGS: There is progressive worsening in the appearance of the pulmonary interstitium especially inferiorly in the right upper lobe and in the left upper  lobe since the study of 4 July. However, the appearance is similar to that from the study of 2 July. The cardiac silhouette new is normal. The right internal jugular venous catheter tip lies in the midportion of the SVC. There is no pleural effusion.  IMPRESSION: Worsening appearance of the pulmonary interstitium consistent with pulmonary interstitial edema. There may be underlying pneumonia in the upper lobes.   Electronically Signed   By: David  Martinique   On: 03/08/2014 08:35   Dg Chest Port 1 View  03/07/2014   CLINICAL DATA:  Aspiration pneumonia.  Pulmonary edema.  EXAM: PORTABLE CHEST - 1 VIEW  COMPARISON:  03/05/2014.  FINDINGS: Stable enlarged cardiac silhouette. Stable right jugular catheter. Mildly increased airspace opacity in the right mid lung zone. No significant change in left lung airspace opacities and diffusely prominent interstitial markings.  IMPRESSION: 1. Mildly worsened probable pneumonia in the right mid lung zone. 2. Stable probable pneumonia in the left lung. 3. Stable cardiomegaly and interstitial lung disease.   Electronically Signed   By: Enrique Sack M.D.   On: 03/07/2014 07:21   . amiodarone  400 mg Oral BID  . amitriptyline  25 mg Oral QHS  . antiseptic oral rinse  15  mL Mouth Rinse BID  . apixaban  5 mg Oral BID  . atorvastatin  20 mg Oral q1800  . cloNIDine  0.1 mg Oral 3 times per day  . diltiazem  60 mg Oral 4 times per day  . febuxostat  40 mg Oral Daily  . feeding supplement (ENSURE COMPLETE)  237 mL Oral BID BM  . insulin aspart  0-9 Units Subcutaneous TID WC  . insulin aspart  20 Units Subcutaneous TID PC  . insulin glargine  45 Units Subcutaneous Daily  . levalbuterol  0.63 mg Nebulization 4 times per day  . Linaclotide  290 mcg Oral Daily  . metoprolol  50 mg Oral BID  . pantoprazole  40 mg Oral Q1200  . piperacillin-tazobactam (ZOSYN)  IV  3.375 g Intravenous 3 times per day  . sodium chloride  3 mL Intravenous Q12H    BMET    Component Value Date/Time   NA 139 03/08/2014 0300   K 3.5* 03/08/2014 0300   CL 99 03/08/2014 0300   CO2 26 03/08/2014 0300   GLUCOSE 163* 03/08/2014 0300   BUN 20 03/08/2014 0300   CREATININE 2.58* 03/08/2014 0300   CALCIUM 8.5 03/08/2014 0300   GFRNONAA 18* 03/08/2014 0300   GFRAA 21* 03/08/2014 0300   CBC    Component Value Date/Time   WBC 10.7* 03/08/2014 0300   RBC 2.99* 03/08/2014 0300   HGB 9.1* 03/08/2014 0300   HCT 27.4* 03/08/2014 0300   PLT 267 03/08/2014 0300   MCV 91.6 03/08/2014 0300   MCH 30.4 03/08/2014 0300   MCHC 33.2 03/08/2014 0300   RDW 15.1 03/08/2014 0300   LYMPHSABS 2.3 03/08/2014 0300   MONOABS 1.0 03/08/2014 0300   EOSABS 1.0* 03/08/2014 0300   BASOSABS 0.0 03/08/2014 0300     Assessment/Plan:  1. Acute respiratory failure- unclear etiology, likely aspiration PNA vs. pulmonary edema.  On zosyn.  Holding lasix today per PCCM.   2. CKD4- Cr trending up today, hold lasix. Good UOP.    3. DM- Per primary team.   4. HTN- Blood pressure WNL, management per primary  5. Atria fib- likely driven by PNA.  Management per cards. On metop, amiodarone and diltiazem.  6. Anemia- hgb continues to  drop, 12.7-->9.1 today.  Still oozing from central line site.  Carloyn Jaeger, PGY-2  I have seen and examined  this patient and agree with plan as outlined by Dr. Gordy Levan.  CVP is 10 and agree with holding lasix as her respiratory issues were not related to volume excess.  Her ongoing blood loss is worrisome.  Would d/c TLC or use quick-clot to stop the bleeding as she has dropped 3gm in Hgb. Wania Longstreth A,MD 03/08/2014 12:23 PM

## 2014-03-08 NOTE — Progress Notes (Signed)
Advanced Heart Failure Rounding Note   Subjective:    Breathing much better  Received 1 dose IV lasix yesterday. Cr now 2.4->2.5  CXR from 7/4 shows worsening RML infiltrate and some edema.   Remains in AF. Rate now down to 60-70s on IV Amio and diltiazem.  CVP now 10 (checked personally)  CVL still bleeding (but slowing). Remains on Eliquis. Hgb 11.1->9.8->9.1  Echo with EF 60-65%. RV ok.    Objective:   Weight Range:  Vital Signs:   Temp:  [98.3 F (36.8 C)-98.9 F (37.2 C)] 98.3 F (36.8 C) (07/05 0340) Pulse Rate:  [42-116] 49 (07/05 0600) Resp:  [14-36] 26 (07/05 0600) BP: (68-161)/(33-102) 107/50 mmHg (07/05 0600) SpO2:  [91 %-100 %] 95 % (07/05 0600) FiO2 (%):  [30 %] 30 % (07/05 0141) Weight:  [106.8 kg (235 lb 7.2 oz)] 106.8 kg (235 lb 7.2 oz) (07/05 0500) Last BM Date: 03/03/14  Weight change: Filed Weights   03/07/14 0618 03/07/14 0619 03/08/14 0500  Weight: 105.1 kg (231 lb 11.3 oz) 105.6 kg (232 lb 12.9 oz) 106.8 kg (235 lb 7.2 oz)    Intake/Output:   Intake/Output Summary (Last 24 hours) at 03/08/14 0629 Last data filed at 03/08/14 0600  Gross per 24 hour  Intake 1495.8 ml  Output   1125 ml  Net  370.8 ml    Physical Exam: CVP 10 General: Obese woman lying flat in bed on face mask . More comfortable HEENT: normal  Neck: supple. JVP hard to see Carotids 2+ bilat; no bruits. No lymphadenopathy or thryomegaly appreciated. RIJ TLC with blood soaked dressing Cor: PMI nonpalpbale. Distant. Irregular. No obvious murmur  Lungs:  clear Abdomen: Obese soft, nontender,. No hepatosplenomegaly. No bruits or masses. Good bowel sounds.  Extremities: no cyanosis, clubbing, rash, warm. No edema Neuro: alert & orientedx3, cranial nerves grossly intact. moves all 4 extremities w/o difficulty. Affect pleasant    Telemetry: A fib 60-70s  Labs: Basic Metabolic Panel:  Recent Labs Lab 03/04/14 0338 03/05/14 0251 03/05/14 1710 03/06/14 0410 03/07/14 0400  03/08/14 0300  NA 137 139  --  139 142 139  K 4.4 3.7  --  4.0 3.4* 3.5*  CL 102 99  --  98 104 99  CO2 19 22  --  25 24 26   GLUCOSE 217* 262*  --  241* 163* 163*  BUN 19 21  --  21 21 20   CREATININE 2.14* 2.38*  --  2.43* 2.43* 2.58*  CALCIUM 8.7 9.1  --  8.9 8.0* 8.5  MG  --   --  2.1  --   --   --   PHOS  --   --  1.5*  --   --   --     Liver Function Tests:  Recent Labs Lab 03/04/14 0338  AST 14  ALT 7  ALKPHOS 87  BILITOT 0.5  PROT 6.7  ALBUMIN 2.9*   No results found for this basename: LIPASE, AMYLASE,  in the last 168 hours No results found for this basename: AMMONIA,  in the last 168 hours  CBC:  Recent Labs Lab 03/04/14 0855 03/05/14 0251 03/05/14 1710 03/06/14 0410 03/07/14 0400 03/08/14 0300  WBC 9.9 13.2* 15.8* 12.8* 11.4* 10.7*  NEUTROABS 7.8*  --   --   --  8.1* 6.4  HGB 12.7 12.1 12.0 11.1* 9.8* 9.1*  HCT 39.7 38.0 36.4 34.7* 30.6* 27.4*  MCV 94.3 93.4 91.5 92.3 91.9 91.6  PLT 225 245 267  254 260 267    Cardiac Enzymes:  Recent Labs Lab 03/03/14 1851 03/03/14 2255 03/04/14 0338 03/04/14 1040  TROPONINI <0.30 <0.30 <0.30 <0.30    BNP: BNP (last 3 results)  Recent Labs  03/03/14 1851  PROBNP 1531.0*     Other results:    Imaging: Dg Chest Port 1 View  03/07/2014   CLINICAL DATA:  Aspiration pneumonia.  Pulmonary edema.  EXAM: PORTABLE CHEST - 1 VIEW  COMPARISON:  03/05/2014.  FINDINGS: Stable enlarged cardiac silhouette. Stable right jugular catheter. Mildly increased airspace opacity in the right mid lung zone. No significant change in left lung airspace opacities and diffusely prominent interstitial markings.  IMPRESSION: 1. Mildly worsened probable pneumonia in the right mid lung zone. 2. Stable probable pneumonia in the left lung. 3. Stable cardiomegaly and interstitial lung disease.   Electronically Signed   By: Enrique Sack M.D.   On: 03/07/2014 07:21     Medications:     Scheduled Medications: . albuterol  2.5 mg  Inhalation Q6H  . amitriptyline  25 mg Oral QHS  . antiseptic oral rinse  15 mL Mouth Rinse BID  . apixaban  5 mg Oral BID  . atorvastatin  20 mg Oral q1800  . cloNIDine  0.1 mg Oral 3 times per day  . diltiazem  10 mg Intravenous Once  . febuxostat  40 mg Oral Daily  . feeding supplement (ENSURE COMPLETE)  237 mL Oral BID BM  . insulin aspart  0-9 Units Subcutaneous TID WC  . insulin aspart  20 Units Subcutaneous TID PC  . insulin glargine  45 Units Subcutaneous Daily  . Linaclotide  290 mcg Oral Daily  . metoprolol  50 mg Oral BID  . pantoprazole  40 mg Oral Q1200  . piperacillin-tazobactam (ZOSYN)  IV  3.375 g Intravenous 3 times per day  . sodium chloride  3 mL Intravenous Q12H    Infusions: . sodium chloride 5 mL/hr at 03/05/14 1934  . sodium chloride 5 mL/hr at 03/05/14 1934  . sodium chloride 10 mL/hr at 03/07/14 2000  . amiodarone 30 mg/hr (03/08/14 0400)  . diltiazem (CARDIZEM) infusion 20 mg/hr (03/08/14 0500)    PRN Medications: acetaminophen, acetaminophen, HYDROcodone-homatropine, methocarbamol, morphine injection, ondansetron (ZOFRAN) IV, ondansetron, oxyCODONE-acetaminophen, zolpidem   Assessment:  1. Acute respiratory failure  2. A/c chronic diastolic HF  3. Probable aspiration PNA  4. Chronic renal failure, stage IV  5. Obesity  6. AF s/p DC-CV on 6/30  7. HTN  8. DM2 9. Hypokalemia   Plan/Discussion:    She continues to improve. Major respiratory issue seems to clearly be PNA (likely aspiration). Improving with vanc/zosyn. Renal function climbing slowly so would hold lasix today and consider restarting home diuretics in a day or two.   She is back in AF after recent DC-CV. Ventricular rate slow on iv amio and diltiazem.Will switch both to po. Ideally would like to continue apixaban in setting of recent DC-CV but oozing from CVL remains a significant issue. Will d/w with CCM if we can pull CVL?  Marianna Cid,MD 6:29 AM

## 2014-03-08 NOTE — Progress Notes (Signed)
eLink Physician-Brief Progress Note Patient Name: [name] DOB: [birth year] MRN: [id number]  Date of Service  03/08/2014   HPI/Events of Note  Hypokalemia in the setting of CKD stage 4  eICU Interventions  Plan: Potassium replaced times one   Intervention Category Intermediate Interventions: Electrolyte abnormality - evaluation and management  DETERDING,ELIZABETH 03/08/2014, 4:43 AM

## 2014-03-09 ENCOUNTER — Inpatient Hospital Stay (HOSPITAL_COMMUNITY): Payer: Medicare HMO

## 2014-03-09 LAB — BASIC METABOLIC PANEL
Anion gap: 12 (ref 5–15)
BUN: 20 mg/dL (ref 6–23)
CO2: 26 mEq/L (ref 19–32)
Calcium: 9 mg/dL (ref 8.4–10.5)
Chloride: 103 mEq/L (ref 96–112)
Creatinine, Ser: 2.57 mg/dL — ABNORMAL HIGH (ref 0.50–1.10)
GFR calc Af Amer: 21 mL/min — ABNORMAL LOW (ref >90)
GFR calc non Af Amer: 18 mL/min — ABNORMAL LOW (ref >90)
Glucose, Bld: 73 mg/dL (ref 70–99)
Potassium: 3.5 mEq/L — ABNORMAL LOW (ref 3.7–5.3)
Sodium: 141 mEq/L (ref 137–147)

## 2014-03-09 LAB — CBC WITH DIFFERENTIAL/PLATELET
Basophils Absolute: 0 10*3/uL (ref 0.0–0.1)
Basophils Relative: 0 % (ref 0–1)
Eosinophils Absolute: 1 10*3/uL — ABNORMAL HIGH (ref 0.0–0.7)
Eosinophils Relative: 10 % — ABNORMAL HIGH (ref 0–5)
HCT: 26.9 % — ABNORMAL LOW (ref 36.0–46.0)
Hemoglobin: 8.6 g/dL — ABNORMAL LOW (ref 12.0–15.0)
Lymphocytes Relative: 21 % (ref 12–46)
Lymphs Abs: 2.2 10*3/uL (ref 0.7–4.0)
MCH: 29.2 pg (ref 26.0–34.0)
MCHC: 32 g/dL (ref 30.0–36.0)
MCV: 91.2 fL (ref 78.0–100.0)
Monocytes Absolute: 1.2 10*3/uL — ABNORMAL HIGH (ref 0.1–1.0)
Monocytes Relative: 12 % (ref 3–12)
Neutro Abs: 6.1 10*3/uL (ref 1.7–7.7)
Neutrophils Relative %: 57 % (ref 43–77)
Platelets: 324 10*3/uL (ref 150–400)
RBC: 2.95 MIL/uL — ABNORMAL LOW (ref 3.87–5.11)
RDW: 14.9 % (ref 11.5–15.5)
WBC: 10.6 10*3/uL — ABNORMAL HIGH (ref 4.0–10.5)

## 2014-03-09 LAB — GLUCOSE, CAPILLARY
Glucose-Capillary: 122 mg/dL — ABNORMAL HIGH (ref 70–99)
Glucose-Capillary: 83 mg/dL (ref 70–99)
Glucose-Capillary: 194 mg/dL — ABNORMAL HIGH (ref 70–99)
Glucose-Capillary: 182 mg/dL — ABNORMAL HIGH (ref 70–99)
Glucose-Capillary: 67 mg/dL — ABNORMAL LOW (ref 70–99)
Glucose-Capillary: 111 mg/dL — ABNORMAL HIGH (ref 70–99)
Glucose-Capillary: 218 mg/dL — ABNORMAL HIGH (ref 70–99)

## 2014-03-09 NOTE — Care Management Note (Signed)
    Page 1 of 2   03/13/2014     4:12:12 PM CARE MANAGEMENT NOTE 03/13/2014  Patient:  [name], [name]   Account Number:  [id number]  Date Initiated:  03/06/2014  Documentation initiated by:  Elissa Hefty  Subjective/Objective Assessment:   adm w resp failure     Action/Plan:   lives w fam, act w [facility], pcp dr Trilby Drummer   Anticipated DC Date:  03/13/2014   Anticipated DC Plan:  [place] referral  Clinical Social Worker      DC Planning Services  CM consult      Choice offered to / List presented to:             Status of service:  Completed, signed off Medicare Important Message given?  YES (If response is "NO", the following Medicare IM given date fields will be blank) Date Medicare IM given:  03/06/2014 Medicare IM given by:  Elissa Hefty Date Additional Medicare IM given:  03/12/2014 Additional Medicare IM given by:  Chrishana Spargur  Discharge Disposition:  [place]  Per UR Regulation:  Reviewed for med. necessity/level of care/duration of stay  If discussed at [place] of Stay Meetings, dates discussed:   03/11/2014    Comments:  Contact:  Ruffin,Tanya Daughter [phone]                 Smith,[place] Daughter [phone]  03/13/14 Ellan Lambert, RN, BSN [phone] Pt discharged to SNF today, per CSW arrangements.  03/12/14 Ellan Lambert, RN, BSN [phone] Pt did not dc home yesterday, due to SOB, hypoxia.  Per MD, NP, she will need ST-SNF; they have had this conversation with pt and she is agreeable to this.  Will consult CSW to facilitate dc to SNF when bed available.  Will notify [facility] of change in plans.  03/11/14 Ellan Lambert, [place], BSN [phone] Pt for dc home today with daughter and [facility] services as PTA. Notified Juliann Pulse with [name] of dc today; faxed resumption orders to [phone].  Pt will not need home oxygen, as does not have qualifying O2 sats.  Pt denies any DME needs for home.  03-10-14 11am [place] Patient lives with daughter,  mostly independent at home. Verified does have [place] at home with [facility] and would like them to continue.  03/09/14 Ellan Lambert, RN, BSN  [phone] Notified by Tye [place] with [facility] that pt is active with this agency for PT, OT, and Nursing.

## 2014-03-09 NOTE — Evaluation (Signed)
Clinical/Bedside Swallow Evaluation Patient Details  Name: [name] MRN: [id number] Date of Birth: 1945/12/14  Today's Date: 03/09/2014 Time: [id number] SLP Time Calculation (min): 18 min  Past Medical History:  Past Medical History  Diagnosis Date  . Atrial flutter   . Hypertension   . Diabetes mellitus   . Diastolic heart failure   . Asthma   . Hyperlipidemia   . Fatty liver   . Esophageal dysmotility   . Arthritis   . Congestive heart failure   . Sleep apnea     wears CPAP  . Family history of malignant neoplasm of gastrointestinal tract   . Fatty tumor fatty tumor back  . Coronary atherosclerosis of native coronary artery   . Morbid obesity   . Myocardial infarction 2009  . Dysrhythmia     afib,flutter hx  . Heart murmur   . Peripheral vascular disease   . Shortness of breath   . Pneumonia     hx  . GERD (gastroesophageal reflux disease)     barrets esophagus  . Anginal pain     occ; non-ischemic Lexiscan 09/2012  . Kidney disease     CKD stage IV (Dr. Erling Cruz)   Past Surgical History:  Past Surgical History  Procedure Laterality Date  . Coronary angioplasty with stent placement    . Breast lumpectomy      right  . Tubal ligation    . Tonsillectomy    . Total knee arthroplasty Right 12/15/2013    Procedure: RIGHT TOTAL KNEE ARTHROPLASTY;  Surgeon: Alta Corning, MD;  Location: [place];  Service: Orthopedics;  Laterality: Right;  . Cardioversion N/A 03/03/2014    Procedure: CARDIOVERSION;  Surgeon: Laverda Page, MD;  Location: [facility] ENDOSCOPY;  Service: Cardiovascular;  Laterality: N/A;   HPI:  68 y.o. female with recently diagnosed with A-fib/flutter (apixaban) who [facility] with Acute respiratory failure d/t aspiration pna (after cardioversion) Vs pulm edema vs both - chest pain and shortness of breath after a scheduled cardioversion. Being treated for possible aspiration pneumonia vs pulm edema. 7/02 noted worsening shortness of  breath and increasing O2 requirements.  Note also a h.o GERD and barret's esophagus.  Note that CXR 4/15 with questionable pneumonitis and 4/17 with bilateral airspace opacities. Most recent CXR this admission wtih question of underlying multifocal PNA.   Assessment / Plan / Recommendation Clinical Impression  Patient presents with a suspected primary esophageal dysphagia given c/o globus with solid food intake, h/o GERD, esophageal dysmotility and [name]'s esophagus with what appears to be a normal oropharyngeal swallow. No overt indication of aspiration noted. Suspect that if patient is aspirating (Note h/o PNA if April as well),  it is post swallow due to esophageal deficits. Educated patient regarding general safe swallowing and esophageal precautions which patient is completing largely independently at this time. Discussed findings and thoughts with RN. Will defer decision to further instrumental testing to MD. At this time, would suggest beginning with esophageal w/u (? barium swallow). If aspiration and/or oropharyngeal deficits noted, could proceed with MBS. Will f/u for plan.     Aspiration Risk  Mild    Diet Recommendation Regular;Thin liquid   Liquid Administration via: Cup;Straw Medication Administration: Whole meds with liquid Supervision: Patient able to self feed Compensations: Slow rate;Small sips/bites;Follow solids with liquid Postural Changes and/or Swallow Maneuvers: Seated upright 90 degrees;Upright 30-60 min after meal    Other  Recommendations Recommended Consults: Consider esophageal assessment Oral Care Recommendations: Oral  care BID   Follow Up Recommendations   (TBD)    Frequency and Duration        Pertinent Vitals/Pain N/a        Swallow Study    General HPI: 68 y.o. female with recently diagnosed with A-fib/flutter (apixaban) who [facility] with Acute respiratory failure d/t aspiration pna (after cardioversion) Vs pulm edema vs both -  chest pain and shortness of breath after a scheduled cardioversion. Being treated for possible aspiration pneumonia vs pulm edema. 7/02 noted worsening shortness of breath and increasing O2 requirements.  Note also a h.o GERD and barret's esophagus.  Note that CXR 4/15 with questionable pneumonitis and 4/17 with bilateral airspace opacities. Most recent CXR this admission wtih question of underlying multifocal PNA. Type of Study: Bedside swallow evaluation Previous Swallow Assessment: none noted Diet Prior to this Study: Regular;Thin liquids Temperature Spikes Noted: No Respiratory Status: Nasal cannula History of Recent Intubation: No Behavior/Cognition: Alert;Cooperative;Pleasant mood Oral Cavity - Dentition: Adequate natural dentition Self-Feeding Abilities: Able to feed self Patient Positioning: Upright in chair Baseline Vocal Quality: Clear Volitional Cough: Strong Volitional Swallow: Able to elicit    Oral/Motor/Sensory Function Overall Oral Motor/Sensory Function: Appears within functional limits for tasks assessed   Ice Chips Ice chips: Not tested   Thin Liquid Thin Liquid: Within functional limits Presentation: Cup;Self Fed;Straw    Nectar Thick Nectar Thick Liquid: Not tested   Honey Thick Honey Thick Liquid: Not tested   Puree Puree: Within functional limits (except for c/o globus) Presentation: Self Fed;Spoon   Solid   GO   Debra Schara MA, CCC-SLP [phone]  Solid: Within functional limits (except for c/o globus) Presentation: Self Fed       [name] 03/09/2014,4:14 PM

## 2014-03-09 NOTE — Progress Notes (Signed)
Advanced Heart Failure Rounding Note   Subjective:    Breathing better. Central line pulled yesterday. Hgb 11.1->9.8->9.1->8.6  Lasix on hold. Cr stable at 2.5. Weight down to 232.  CXR improved  Remains in AF. Rate  90-100s on po Amio and diltiazem.    Echo with EF 60-65%. RV ok.    Objective:   Weight Range:  Vital Signs:   Temp:  [97.7 F (36.5 C)-99.6 F (37.6 C)] 97.7 F (36.5 C) (07/06 0700) Pulse Rate:  [57-126] 79 (07/06 0736) Resp:  [17-33] 23 (07/06 0736) BP: (82-141)/(26-111) 130/69 mmHg (07/06 0736) SpO2:  [93 %-100 %] 99 % (07/06 0841) Weight:  [105.6 kg (232 lb 12.9 oz)] 105.6 kg (232 lb 12.9 oz) (07/06 0400) Last BM Date: 03/08/14  Weight change: Filed Weights   03/07/14 0619 03/08/14 0500 03/09/14 0400  Weight: 105.6 kg (232 lb 12.9 oz) 106.8 kg (235 lb 7.2 oz) 105.6 kg (232 lb 12.9 oz)    Intake/Output:   Intake/Output Summary (Last 24 hours) at 03/09/14 0855 Last data filed at 03/09/14 0519  Gross per 24 hour  Intake  226.8 ml  Output    725 ml  Net -498.2 ml    Physical Exam:  General: Obese woman sitting in chair on nasal cannula. More comfortable HEENT: normal  Neck: supple. JVP hard to see Carotids 2+ bilat; no bruits. No lymphadenopathy or thryomegaly appreciated. Cor: PMI nonpalpbale. Distant. Irregular. No obvious murmur  Lungs:  clear Abdomen: Obese soft, nontender,. No hepatosplenomegaly. No bruits or masses. Good bowel sounds.  Extremities: no cyanosis, clubbing, rash, warm. No edema Neuro: alert & orientedx3, cranial nerves grossly intact. moves all 4 extremities w/o difficulty. Affect pleasant    Telemetry: A fib 90-100s  Labs: Basic Metabolic Panel:  Recent Labs Lab 03/05/14 0251 03/05/14 1710 03/06/14 0410 03/07/14 0400 03/08/14 0300 03/09/14 0312  NA 139  --  139 142 139 141  K 3.7  --  4.0 3.4* 3.5* 3.5*  CL 99  --  98 104 99 103  CO2 22  --  25 24 26 26   GLUCOSE 262*  --  241* 163* 163* 73  BUN 21  --  21 21  20 20   CREATININE 2.38*  --  2.43* 2.43* 2.58* 2.57*  CALCIUM 9.1  --  8.9 8.0* 8.5 9.0  MG  --  2.1  --   --   --   --   PHOS  --  1.5*  --   --   --   --     Liver Function Tests:  Recent Labs Lab 03/04/14 0338  AST 14  ALT 7  ALKPHOS 87  BILITOT 0.5  PROT 6.7  ALBUMIN 2.9*   No results found for this basename: LIPASE, AMYLASE,  in the last 168 hours No results found for this basename: AMMONIA,  in the last 168 hours  CBC:  Recent Labs Lab 03/04/14 0855  03/05/14 1710 03/06/14 0410 03/07/14 0400 03/08/14 0300 03/09/14 0312  WBC 9.9  < > 15.8* 12.8* 11.4* 10.7* 10.6*  NEUTROABS 7.8*  --   --   --  8.1* 6.4 6.1  HGB 12.7  < > 12.0 11.1* 9.8* 9.1* 8.6*  HCT 39.7  < > 36.4 34.7* 30.6* 27.4* 26.9*  MCV 94.3  < > 91.5 92.3 91.9 91.6 91.2  PLT 225  < > 267 254 260 267 324  < > = values in this interval not displayed.  Cardiac Enzymes:  Recent Labs  Lab 03/03/14 1851 03/03/14 2255 03/04/14 0338 03/04/14 1040  TROPONINI <0.30 <0.30 <0.30 <0.30    BNP: BNP (last 3 results)  Recent Labs  03/03/14 1851  PROBNP 1531.0*     Other results:    Imaging: Dg Chest Port 1 View  03/09/2014   CLINICAL DATA:  Shortness of breath and cough  EXAM: PORTABLE CHEST - 1 VIEW  COMPARISON:  Portable chest x-ray of March 08, 2014  FINDINGS: The lungs are adequately inflated. The interstitial markings while increased have improved since yesterday's study. Densities in the upper lobes are less conspicuous but persist. The cardiac silhouette remains mildly enlarged. The central pulmonary vascularity is indistinct. The right internal jugular venous catheter has been removed.  IMPRESSION: Improving interstitial edema secondary to CHF. When the patient can tolerate the procedure, a PA and lateral chest x-ray would be of value.   Electronically Signed   By: David  Martinique   On: 03/09/2014 07:33   Dg Chest Port 1 View  03/08/2014   CLINICAL DATA:  Aspiration pneumonia  EXAM: PORTABLE CHEST  - 1 VIEW  COMPARISON:  Portable chest x-ray of March 07, 2014  FINDINGS: There is progressive worsening in the appearance of the pulmonary interstitium especially inferiorly in the right upper lobe and in the left upper lobe since the study of 4 July. However, the appearance is similar to that from the study of 2 July. The cardiac silhouette new is normal. The right internal jugular venous catheter tip lies in the midportion of the SVC. There is no pleural effusion.  IMPRESSION: Worsening appearance of the pulmonary interstitium consistent with pulmonary interstitial edema. There may be underlying pneumonia in the upper lobes.   Electronically Signed   By: David  Martinique   On: 03/08/2014 08:35     Medications:     Scheduled Medications: . amiodarone  400 mg Oral BID  . amitriptyline  25 mg Oral QHS  . antiseptic oral rinse  15 mL Mouth Rinse BID  . apixaban  5 mg Oral BID  . atorvastatin  20 mg Oral q1800  . cloNIDine  0.1 mg Oral 3 times per day  . diltiazem  60 mg Oral 4 times per day  . febuxostat  40 mg Oral Daily  . feeding supplement (ENSURE COMPLETE)  237 mL Oral BID BM  . insulin aspart  0-9 Units Subcutaneous TID WC  . insulin aspart  20 Units Subcutaneous TID PC  . insulin glargine  45 Units Subcutaneous Daily  . levalbuterol  0.63 mg Nebulization 4 times per day  . Linaclotide  290 mcg Oral Daily  . metoprolol  50 mg Oral BID  . pantoprazole  40 mg Oral Q1200  . piperacillin-tazobactam (ZOSYN)  IV  3.375 g Intravenous 3 times per day  . sodium chloride  3 mL Intravenous Q12H    Infusions: . sodium chloride 5 mL/hr at 03/05/14 1934  . sodium chloride 5 mL/hr at 03/05/14 1934  . sodium chloride 10 mL/hr at 03/08/14 0800  . diltiazem (CARDIZEM) infusion 10 mg/hr (03/08/14 0800)    PRN Medications: acetaminophen, acetaminophen, HYDROcodone-homatropine, methocarbamol, morphine injection, ondansetron (ZOFRAN) IV, ondansetron, oxyCODONE-acetaminophen, zolpidem   Assessment:   1. Acute respiratory failure  2. A/c chronic diastolic HF  3. Probable aspiration PNA  4. Chronic renal failure, stage IV  5. Obesity  6. AF s/p DC-CV on 6/30  7. HTN  8. DM2 9. Hypokalemia   Plan/Discussion:    She continues to improve. Major respiratory  issue seems to clearly be PNA (likely aspiration). Improving with vanc/zosyn -> zosyn.   Volume status looks good. Would continue to hold lasix for one more day   She is back in AF. Would likely benefit from repeat DC-CV tomorrow but i will talk with daughters given probable aspiration event with last DC-CV. Continue amio and apixaban.   Can likely go to SDU. Will consult PT.   Kwasi Joung,MD 9:01 AM

## 2014-03-09 NOTE — Progress Notes (Signed)
I have personally seen and examined this patient and agree with the assessment/plan as outlined above by Denton Brick MD (PGY2).  Renal fucntion unchanged after stopping lasix overnight- UOP fair- probably incompletely collected after foley DC (886mL) with reducing weight. No changes to management at this time- will restart torsemide 40mg  BID in the next 24-48hrs depending on labs/clinical status. May need PRBCs if Hgb <8.  Criss Pallone K.,MD 03/09/2014 10:03 AM

## 2014-03-09 NOTE — Progress Notes (Signed)
S: No complaints today. Eating breakfast. Feels and appears much better. Urine output- 848mls over the past 24 hrs.   O:BP 130/69  Pulse 79  Temp(Src) 97.7 F (36.5 C) (Oral)  Resp 23  Ht 5\' 4"  (1.626 m)  Wt 232 lb 12.9 oz (105.6 kg)  BMI 39.94 kg/m2  SpO2 97%  Intake/Output Summary (Last 24 hours) at 03/09/14 0829 Last data filed at 03/09/14 0519  Gross per 24 hour  Intake  226.8 ml  Output    725 ml  Net -498.2 ml   Intake/Output: I/O last 3 completed shifts: In: 933.9 [I.V.:733.9; IV Piggyback:200] Out: [id number]  Intake/Output this shift:    Weight change: -2 lb 10.3 oz (-1.2 kg)  Physical Exam-  Vitals noted. Gen: NAD, on 3L [place] saturating at 97% HEENT: [place]/AT [id number], no murmurs appreciated. Resp: normal work of breathing, crackles heard anteriorly. 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 Neuro: Alert and oriented, moving all extremities.  Recent Labs Lab 03/03/14 1851 03/04/14 0338 03/05/14 0251 03/05/14 1710 03/06/14 0410 03/07/14 0400 03/08/14 0300 03/09/14 0312  NA 142 137 139  --  139 142 139 141  K 4.0 4.4 3.7  --  4.0 3.4* 3.5* 3.5*  CL 104 102 99  --  98 104 99 103  CO2 21 19 22   --  25 24 26 26   GLUCOSE 165* 217* 262*  --  241* 163* 163* 73  BUN 18 19 21   --  21 21 20 20   CREATININE 2.14* 2.14* 2.38*  --  2.43* 2.43* 2.58* 2.57*  ALBUMIN  --  2.9*  --   --   --   --   --   --   CALCIUM 9.4 8.7 9.1  --  8.9 8.0* 8.5 9.0  PHOS  --   --   --  1.5*  --   --   --   --   AST  --  14  --   --   --   --   --   --   ALT  --  7  --   --   --   --   --   --    Liver Function Tests:  Recent Labs Lab 03/04/14 0338  AST 14  ALT 7  ALKPHOS 87  BILITOT 0.5  PROT 6.7  ALBUMIN 2.9*   CBC:  Recent Labs Lab 03/05/14 1710 03/06/14 0410 03/07/14 0400 03/08/14 0300 03/09/14 0312  WBC 15.8*  12.8* 11.4* 10.7* 10.6*  NEUTROABS  --   --  8.1* 6.4 6.1  HGB 12.0 11.1* 9.8* 9.1* 8.6*  HCT 36.4 34.7* 30.6* 27.4* 26.9*  MCV 91.5 92.3 91.9 91.6 91.2  PLT 267 254 260 267 324   Cardiac Enzymes:  Recent Labs Lab 03/03/14 1851 03/03/14 2255 03/04/14 0338 03/04/14 1040  TROPONINI <0.30 <0.30 <0.30 <0.30   CBG:  Recent Labs Lab 03/07/14 2139 03/08/14 0813 03/08/14 1237 03/08/14 1755 03/08/14 2142  GLUCAP 199* 154* 174* 75 122*   Studies/Results: Dg Chest Port 1 View  03/09/2014   CLINICAL DATA:  Shortness of breath and cough  EXAM: PORTABLE CHEST - 1 VIEW  COMPARISON:  Portable chest x-ray of March 08, 2014  FINDINGS: The lungs are adequately inflated. The interstitial markings while increased have improved since yesterday's study. Densities in the upper lobes  are less conspicuous but persist. The cardiac silhouette remains mildly enlarged. The central pulmonary vascularity is indistinct. The right internal jugular venous catheter has been removed.  IMPRESSION: Improving interstitial edema secondary to CHF. When the patient can tolerate the procedure, a PA and lateral chest x-ray would be of value.   Electronically Signed   By: David  Martinique   On: 03/09/2014 07:33   Dg Chest Port 1 View  03/08/2014   CLINICAL DATA:  Aspiration pneumonia  EXAM: PORTABLE CHEST - 1 VIEW  COMPARISON:  Portable chest x-ray of March 07, 2014  FINDINGS: There is progressive worsening in the appearance of the pulmonary interstitium especially inferiorly in the right upper lobe and in the left upper lobe since the study of 4 July. However, the appearance is similar to that from the study of 2 July. The cardiac silhouette new is normal. The right internal jugular venous catheter tip lies in the midportion of the SVC. There is no pleural effusion.  IMPRESSION: Worsening appearance of the pulmonary interstitium consistent with pulmonary interstitial edema. There may be underlying pneumonia in the upper lobes.    Electronically Signed   By: David  [place]   On: 03/08/2014 08:35   . amiodarone  400 mg Oral BID  . amitriptyline  25 mg Oral QHS  . antiseptic oral rinse  15 mL Mouth Rinse BID  . apixaban  5 mg Oral BID  . atorvastatin  20 mg Oral q1800  . cloNIDine  0.1 mg Oral 3 times per day  . diltiazem  60 mg Oral 4 times per day  . febuxostat  40 mg Oral Daily  . feeding supplement (ENSURE COMPLETE)  237 mL Oral BID BM  . insulin aspart  0-9 Units Subcutaneous TID WC  . insulin aspart  20 Units Subcutaneous TID PC  . insulin glargine  45 Units Subcutaneous Daily  . levalbuterol  0.63 mg Nebulization 4 times per day  . Linaclotide  290 mcg Oral Daily  . metoprolol  50 mg Oral BID  . pantoprazole  40 mg Oral Q1200  . piperacillin-tazobactam (ZOSYN)  IV  3.375 g Intravenous 3 times per day  . sodium chloride  3 mL Intravenous Q12H    BMET    Component Value Date/Time   NA 141 03/09/2014 0312   K 3.5* 03/09/2014 0312   CL 103 03/09/2014 0312   CO2 26 03/09/2014 0312   GLUCOSE 73 03/09/2014 0312   BUN 20 03/09/2014 0312   CREATININE 2.57* 03/09/2014 0312   CALCIUM 9.0 03/09/2014 0312   GFRNONAA 18* 03/09/2014 0312   GFRAA 21* 03/09/2014 0312   CBC    Component Value Date/Time   WBC 10.6* 03/09/2014 0312   RBC 2.95* 03/09/2014 0312   HGB 8.6* 03/09/2014 0312   HCT 26.9* 03/09/2014 0312   PLT 324 03/09/2014 0312   MCV 91.2 03/09/2014 0312   MCH 29.2 03/09/2014 0312   MCHC 32.0 03/09/2014 0312   RDW 14.9 03/09/2014 0312   LYMPHSABS 2.2 03/09/2014 0312   MONOABS 1.2* 03/09/2014 0312   EOSABS 1.0* 03/09/2014 0312   BASOSABS 0.0 03/09/2014 0312     Assessment/Plan:  1. Acute respiratory failure- unclear etiology, likely aspiration PNA after Cardioversion on Zosyn. Differential- pulmonary edema- suggested on chest xray, but clinically pt dose not appear overloaded, lasix D/c yesterday .   2. CKD4- Cr trending up today 2.58 >> 2.43 yesterday, BUN stable, holding lasix.     3. DM- Per primary team.  4. HTN- Blood  pressure WNL, management per primary  5. Atria fib- likely driven by PNA.  Management per cards. On metop, amiodarone and diltiazem.  6. Anemia- hgb continues to drop, 12.7--> 8.6 today. Likely from bleeding from previous central line site, d/c yesterday. Per PCCM transfuse if hgb falls to 8. On apixaban, consider holding if Hgb continues to drop, till HgB stabilizes.   Denton Brick, Amalee Olsen,MD PGY-2, IMTS 03/09/2014 8:29 AM

## 2014-03-09 NOTE — Progress Notes (Signed)
03/09/2014 4:26 PM  CBG 67  Applesauce and grape juice given. Insulin held. Ayan Yankey, Carolynn Comment

## 2014-03-09 NOTE — Progress Notes (Addendum)
PULMONARY / CRITICAL CARE MEDICINE   Name: [name] MRN: [id number] DOB: 09/30/1945    ADMISSION DATE:  03/03/2014 CONSULTATION DATE:  7/02  REFERRING MD :  Fanny Bien PRIMARY SERVICE: IM  CHIEF COMPLAINT:  Shortness of breath and chest pain   BRIEF PATIENT DESCRIPTION: 68 y.o. female with recently diagnosed with A-fib/flutter (apixaban) who presented to the [facility] 6/30 with chest pain and shortness of breath after a scheduled cardioversion. Being treated for possible aspiration pneumonia vs pulm edema. 7/02 PCCM consulted for worsening shortness of breath and increasing O2 requirements.   SIGNIFICANT EVENTS / STUDIES:  6/30 admitted with SOB, CP after scheduled cardioversion - CXR positive for possible aspiration PNA vs pulm edema 7/02 PCCM consulted for increased work of breathing and increasing oxygen requirements.  7/5- off daytime BIPAP, on 2 liters O2  LINES / TUBES: PIVs Central line 7/02 >>>7/5  CULTURES: Blood cultures 7/02 >>>neg  ANTIBIOTICS: Cefepime 7/1 >>> 7/2 Vancomycin 7/1 >>>7/4 Zosyn 7/2 >>>  SUBJECTIVE:  Line out, site not bleeding  VITAL SIGNS: Temp:  [97.7 F (36.5 C)-99.6 F (37.6 C)] 97.7 F (36.5 C) (07/06 0700) Pulse Rate:  [57-126] 57 (07/06 1000) Resp:  [17-33] 23 (07/06 1000) BP: (82-141)/(26-111) 118/61 mmHg (07/06 0900) SpO2:  [93 %-100 %] 100 % (07/06 1000) Weight:  [105.6 kg (232 lb 12.9 oz)] 105.6 kg (232 lb 12.9 oz) (07/06 0400) HEMODYNAMICS: cv stable , back in AF   VENTILATOR SETTINGS:   INTAKE / OUTPUT: Intake/Output     07/05 0701 - 07/06 0700 07/06 0701 - 07/07 0700   P.O.  120   I.V. (mL/kg) 220.2 (2.1)    IV Piggyback 150    Total Intake(mL/kg) 370.2 (3.5) 120 (1.1)   Urine (mL/kg/hr) 845 (0.3) 650 (1.5)   Total Output 845 650   Net -474.8 -530        Urine Occurrence 1 x    Stool Occurrence 1 x 2 x    PHYSICAL EXAMINATION: General: no acute distress Neuro: A/Ox4, anxious affect, follows all commands,  MAEs HEENT: PERRL, on Nasal cann O2 Cardiovascular: IRIR, distant heart tones  Lungs:CTA Abdomen: Rotund, non-tender, no r/g Musculoskeletal: no demormity Skin: intact  LABS:  CBC  Recent Labs Lab 03/07/14 0400 03/08/14 0300 03/09/14 0312  WBC 11.4* 10.7* 10.6*  HGB 9.8* 9.1* 8.6*  HCT 30.6* 27.4* 26.9*  PLT 260 267 324   Coag's  Recent Labs Lab 03/05/14 2330 03/06/14 0450 03/07/14 0400  APTT  --  48*  --   INR 2.01* 2.12* 2.06*   BMET  Recent Labs Lab 03/07/14 0400 03/08/14 0300 03/09/14 0312  NA 142 139 141  K 3.4* 3.5* 3.5*  CL 104 99 103  CO2 24 26 26   BUN 21 20 20   CREATININE 2.43* 2.58* 2.57*  GLUCOSE 163* 163* 73   Electrolytes  Recent Labs Lab 03/05/14 1710  03/07/14 0400 03/08/14 0300 03/09/14 0312  CALCIUM  --   < > 8.0* 8.5 9.0  MG 2.1  --   --   --   --   PHOS 1.5*  --   --   --   --   < > = values in this interval not displayed. Sepsis Markers  Recent Labs Lab 03/03/14 1851 03/05/14 0251 03/05/14 1710  LATICACIDVEN  --   --  1.6  PROCALCITON <0.10 0.11  --    ABG  Recent Labs Lab 03/05/14 1427  PHART 7.468*  PCO2ART 33.2*  PO2ART 56.0*  Liver Enzymes  Recent Labs Lab 03/04/14 0338  AST 14  ALT 7  ALKPHOS 87  BILITOT 0.5  ALBUMIN 2.9*   Cardiac Enzymes  Recent Labs Lab 03/03/14 1851 03/03/14 2255 03/04/14 0338 03/04/14 1040  TROPONINI <0.30 <0.30 <0.30 <0.30  PROBNP 1531.0*  --   --   --    Glucose  Recent Labs Lab 03/08/14 0813 03/08/14 1237 03/08/14 1755 03/08/14 2142 03/09/14 0736 03/09/14 0952  GLUCAP 154* 174* 75 122* 90 194*    Imaging All imaging in epic system reviewed past 48hrs pcxr 7/6- diffuse int changes left grater rt, fluid fissure rt  ASSESSMENT / PLAN: Principal Problem:   Acute respiratory failure Active Problems:   Obstructive sleep apnea   Chronic kidney disease (CKD), stage IV (severe)   DM (diabetes mellitus), type 2 with renal complications   Chest pain    Atrial fibrillation with RVR   Aspiration pneumonia   Acute diastolic heart failure   Acute pulmonary edema   Coagulopathy   Acute blood loss anemia   PULMONARY A: Acute respiratory failure d/t aspiration pna (after cardioversion) Vs pulm edema vs both -  Pulm edema less H/o sleep apnea P:    Hold lasix 7/5, agree, also remains neg Prn bipap dc daytime F/u cxr noted, clinically appears well xopenex in setting of AF Oxygen titration, on VM oxygen Flutter cpap nocturnal rx as outpt  CARDIOVASCULAR A:  Atrial fibrillation - on Cardizem gtt and amiodarone gtt Hypertension H/o diastolic heart failure Echo : EF 560-65%  LV stiff.   P:  Cards is following Ct cardizem and amiodarone change to PO and get cvl out Low dose metoprolol PRN for HR > 120 Lasix held  RENAL A:   CKD - Serum creatinine baseline 2.1 - 2.9 - possibly d/t aggressive diuretics , unclear, also d/t DM 2 Hypophosphatemia resolved  P:   Hold  Lasix 7/5 follow crt trend Sup electrolytes as needed  GASTROINTESTINAL A:   H/o GERD (barretts esophagus) - questionable contribution to aspiration? IBS P:    PPI  Diet Given gerd h/o and possibel events, may need Barium testing, assess slp  HEMATOLOGIC A: Bleeding from R IJ CVL-resolved fib Acute blood loss anemia   Recent Labs Lab 03/05/14 1710 03/06/14 0410 03/07/14 0400 03/08/14 0300 03/09/14 0312  HGB 12.0 11.1* 9.8* 9.1* 8.6*    P:  F/u CBC Resumed apixaban 7/3 Transfuse if Hgb falls to 7  INFECTIOUS A:    aspiration pneumonia  Improved WBC and fever curve Note pcn "allergy" only nausea  P:   cont zosyn, establish stop date, done 7th  ENDOCRINE A:   Diabetes 2 with renal complication P:   SSI F/u cbgs  NEUROLOGIC A:   H/o Depression H/o insomina Chronic pain? P:   Hold home amitriptyline, ambien Morphine PRN   RASS goal: 0  TODAY'S SUMMARY: consider move to sdu, will sign off, abx stop date in place, renal following  for lasix needs in future  Lavon Paganini. Titus Mould, MD, [place] Pgr: [facility]

## 2014-03-10 LAB — BASIC METABOLIC PANEL
Anion gap: 13 (ref 5–15)
BUN: 18 mg/dL (ref 6–23)
CO2: 26 mEq/L (ref 19–32)
Calcium: 9 mg/dL (ref 8.4–10.5)
Chloride: 104 mEq/L (ref 96–112)
Creatinine, Ser: 2.28 mg/dL — ABNORMAL HIGH (ref 0.50–1.10)
GFR calc Af Amer: 24 mL/min — ABNORMAL LOW (ref >90)
GFR calc non Af Amer: 21 mL/min — ABNORMAL LOW (ref >90)
Glucose, Bld: 202 mg/dL — ABNORMAL HIGH (ref 70–99)
Potassium: 3.6 mEq/L — ABNORMAL LOW (ref 3.7–5.3)
Sodium: 143 mEq/L (ref 137–147)

## 2014-03-10 LAB — CBC
HCT: 27.5 % — ABNORMAL LOW (ref 36.0–46.0)
Hemoglobin: 8.7 g/dL — ABNORMAL LOW (ref 12.0–15.0)
MCH: 28.9 pg (ref 26.0–34.0)
MCHC: 31.6 g/dL (ref 30.0–36.0)
MCV: 91.4 fL (ref 78.0–100.0)
Platelets: 380 10*3/uL (ref 150–400)
RBC: 3.01 MIL/uL — ABNORMAL LOW (ref 3.87–5.11)
RDW: 14.8 % (ref 11.5–15.5)
WBC: 8.1 10*3/uL (ref 4.0–10.5)

## 2014-03-10 LAB — GLUCOSE, CAPILLARY
Glucose-Capillary: 242 mg/dL — ABNORMAL HIGH (ref 70–99)
Glucose-Capillary: 278 mg/dL — ABNORMAL HIGH (ref 70–99)
Glucose-Capillary: 122 mg/dL — ABNORMAL HIGH (ref 70–99)
Glucose-Capillary: 68 mg/dL — ABNORMAL LOW (ref 70–99)
Glucose-Capillary: 130 mg/dL — ABNORMAL HIGH (ref 70–99)

## 2014-03-10 MED ORDER — DILTIAZEM HCL ER COATED BEADS 240 MG PO CP24
240.0000 mg | ORAL_CAPSULE | Freq: Every day | ORAL | Status: DC
  Administered 2014-03-10 – 2014-03-13 (×4): 240 mg via ORAL
  Filled 2014-03-10 (×5): qty 1

## 2014-03-10 MED ORDER — POTASSIUM CHLORIDE CRYS ER 20 MEQ PO TBCR
40.0000 meq | EXTENDED_RELEASE_TABLET | Freq: Once | ORAL | Status: AC
  Administered 2014-03-10: 40 meq via ORAL
  Filled 2014-03-10: qty 2

## 2014-03-10 MED ORDER — AMIODARONE HCL 200 MG PO TABS
200.0000 mg | ORAL_TABLET | Freq: Two times a day (BID) | ORAL | Status: DC
  Administered 2014-03-10 – 2014-03-13 (×6): 200 mg via ORAL
  Filled 2014-03-10 (×7): qty 1

## 2014-03-10 NOTE — Progress Notes (Signed)
Inpatient Diabetes Program Recommendations  AACE/ADA: New Consensus Statement on Inpatient Glycemic Control (2013)  Target Ranges:  Prepandial:   less than 140 mg/dL      Peak postprandial:   less than 180 mg/dL (1-2 hours)      Critically ill patients:  140 - 180 mg/dL   Results for STACIE, TEMPLIN (MRN [id number]) as of 03/10/2014 10:28  Ref. Range 03/09/2014 07:36 03/09/2014 09:52 03/09/2014 11:10 03/09/2014 16:12 03/09/2014 17:12 03/09/2014 21:19 03/10/2014 07:20  Glucose-Capillary Latest Range: 70-99 mg/dL 83 194 (H) 182 (H) 67 (L) 111 (H) 218 (H) 242 (H)   Diabetes history: DM2 Outpatient Diabetes medications: Lantus 40 units daily, Humalog 20 units TID with meals Current orders for Inpatient glycemic control: Lantus 45 units daily, Novolog 0-9 units AC, Novolog 20 units TID with meals for meal coverage  Inpatient Diabetes Program Recommendations Correction (SSI): If diet will be resumed, please consider adding Novolog bedtime correction scale. However, if patient will remain NPO, please consider changing frequency of CBGs and Novolog correction to Q4H.  Insulin - Meal Coverage: NURSING: Please be sure ordered parameters for meal coverage are met before giving Novolog for meal coverage. Noted patient ate 25% of breakfast and got meal coverage on 7/6. Question if noted CBG of 67 mg/dl on 7/6 was due to receiving Novolog meal coverage without meeting ordered parameters for meal coverage.   Thanks, Barnie Alderman, RN, MSN, CCRN Diabetes Coordinator Inpatient Diabetes Program [phone] (Team Pager) [phone] ([facility] office) [phone] [facility] office)

## 2014-03-10 NOTE — Progress Notes (Signed)
Advanced Heart Failure Rounding Note   Subjective:    Much improved. Breathing better. Ambulating hall.  Remains in AF. Rate  90-100s on po Amio and diltiazem.  Renal function improved. Hgb stable  Echo with EF 60-65%. RV ok.    Objective:   Weight Range:  Vital Signs:   Temp:  [97.6 F (36.4 C)-98.5 F (36.9 C)] 97.9 F (36.6 C) (07/07 0700) Pulse Rate:  [33-111] 66 (07/07 0900) Resp:  [13-39] 21 (07/07 0900) BP: (95-159)/(21-106) 126/21 mmHg (07/07 0900) SpO2:  [94 %-100 %] 97 % (07/07 0900) FiO2 (%):  [40 %] 40 % (07/07 0147) Weight:  [106.2 kg (234 lb 2.1 oz)] 106.2 kg (234 lb 2.1 oz) (07/07 0600) Last BM Date: 03/09/14  Weight change: Filed Weights   03/08/14 0500 03/09/14 0400 03/10/14 0600  Weight: 106.8 kg (235 lb 7.2 oz) 105.6 kg (232 lb 12.9 oz) 106.2 kg (234 lb 2.1 oz)    Intake/Output:   Intake/Output Summary (Last 24 hours) at 03/10/14 1143 Last data filed at 03/10/14 1007  Gross per 24 hour  Intake 559.83 ml  Output    200 ml  Net 359.83 ml    Physical Exam:  General: Obese woman sitting in chair on nasal cannula. More comfortable HEENT: normal  Neck: supple. JVP hard to see Carotids 2+ bilat; no bruits. No lymphadenopathy or thryomegaly appreciated. Cor: PMI nonpalpbale. Distant. Irregular. No obvious murmur  Lungs:  clear Abdomen: Obese soft, nontender,. No hepatosplenomegaly. No bruits or masses. Good bowel sounds.  Extremities: no cyanosis, clubbing, rash, warm. No edema Neuro: alert & orientedx3, cranial nerves grossly intact. moves all 4 extremities w/o difficulty. Affect pleasant    Telemetry: A fib 90-100s  Labs: Basic Metabolic Panel:  Recent Labs Lab 03/05/14 1710 03/06/14 0410 03/07/14 0400 03/08/14 0300 03/09/14 0312 03/10/14 1036  NA  --  139 142 139 141 143  K  --  4.0 3.4* 3.5* 3.5* 3.6*  CL  --  98 104 99 103 104  CO2  --  25 24 26 26 26   GLUCOSE  --  241* 163* 163* 73 202*  BUN  --  21 21 20 20 18   CREATININE  --   2.43* 2.43* 2.58* 2.57* 2.28*  CALCIUM  --  8.9 8.0* 8.5 9.0 9.0  MG 2.1  --   --   --   --   --   PHOS 1.5*  --   --   --   --   --     Liver Function Tests:  Recent Labs Lab 03/04/14 0338  AST 14  ALT 7  ALKPHOS 87  BILITOT 0.5  PROT 6.7  ALBUMIN 2.9*   No results found for this basename: LIPASE, AMYLASE,  in the last 168 hours No results found for this basename: AMMONIA,  in the last 168 hours  CBC:  Recent Labs Lab 03/04/14 0855  03/06/14 0410 03/07/14 0400 03/08/14 0300 03/09/14 0312 03/10/14 1036  WBC 9.9  < > 12.8* 11.4* 10.7* 10.6* 8.1  NEUTROABS 7.8*  --   --  8.1* 6.4 6.1  --   HGB 12.7  < > 11.1* 9.8* 9.1* 8.6* 8.7*  HCT 39.7  < > 34.7* 30.6* 27.4* 26.9* 27.5*  MCV 94.3  < > 92.3 91.9 91.6 91.2 91.4  PLT 225  < > 254 260 267 324 380  < > = values in this interval not displayed.  Cardiac Enzymes:  Recent Labs Lab 03/03/14 1851 03/03/14 2255  03/04/14 0338 03/04/14 1040  TROPONINI <0.30 <0.30 <0.30 <0.30    BNP: BNP (last 3 results)  Recent Labs  03/03/14 1851  PROBNP 1531.0*     Other results:    Imaging: Dg Chest Port 1 View  03/09/2014   CLINICAL DATA:  Shortness of breath and cough  EXAM: PORTABLE CHEST - 1 VIEW  COMPARISON:  Portable chest x-ray of March 08, 2014  FINDINGS: The lungs are adequately inflated. The interstitial markings while increased have improved since yesterday's study. Densities in the upper lobes are less conspicuous but persist. The cardiac silhouette remains mildly enlarged. The central pulmonary vascularity is indistinct. The right internal jugular venous catheter has been removed.  IMPRESSION: Improving interstitial edema secondary to CHF. When the patient can tolerate the procedure, a PA and lateral chest x-ray would be of value.   Electronically Signed   By: David  Martinique   On: 03/09/2014 07:33     Medications:     Scheduled Medications: . amiodarone  400 mg Oral BID  . amitriptyline  25 mg Oral QHS  .  antiseptic oral rinse  15 mL Mouth Rinse BID  . apixaban  5 mg Oral BID  . atorvastatin  20 mg Oral q1800  . cloNIDine  0.1 mg Oral 3 times per day  . diltiazem  60 mg Oral 4 times per day  . febuxostat  40 mg Oral Daily  . feeding supplement (ENSURE COMPLETE)  237 mL Oral BID BM  . insulin aspart  0-9 Units Subcutaneous TID WC  . insulin aspart  20 Units Subcutaneous TID PC  . insulin glargine  45 Units Subcutaneous Daily  . levalbuterol  0.63 mg Nebulization 4 times per day  . Linaclotide  290 mcg Oral Daily  . metoprolol  50 mg Oral BID  . pantoprazole  40 mg Oral Q1200  . piperacillin-tazobactam (ZOSYN)  IV  3.375 g Intravenous 3 times per day  . sodium chloride  3 mL Intravenous Q12H    Infusions: . sodium chloride 5 mL/hr at 03/05/14 1934  . sodium chloride 5 mL/hr at 03/05/14 1934  . sodium chloride Stopped (03/10/14 0541)  . diltiazem (CARDIZEM) infusion 10 mg/hr (03/08/14 0800)    PRN Medications: acetaminophen, acetaminophen, HYDROcodone-homatropine, methocarbamol, morphine injection, ondansetron (ZOFRAN) IV, ondansetron, oxyCODONE-acetaminophen, zolpidem   Assessment:  1. Acute respiratory failure  2. A/c chronic diastolic HF  3. Probable aspiration PNA  4. Chronic renal failure, stage IV  5. Obesity  6. AF s/p DC-CV on 6/30  7. HTN  8. DM2 9. Hypokalemia   Plan/Discussion:    She is much improved.  I think we can switch back to home diuretic regimen torsemide 40 bid starting tomorrow.   Prior to admit did not seem to tolerate AF so well. I discussed with Dr. Titus Mould and we both agree that it is likely best to defer repeat attempt at [facility] right now. Will keep her on amio, diltiazem and Xarelto and can readdress DC-CV in several weeks after she has fully recovered from her aspiration PNA.   Change amio to 200 bid and diltiazem to 240 daily.   Debra Carpenter,MD 11:43 AM

## 2014-03-10 NOTE — Progress Notes (Signed)
NUTRITION FOLLOW UP  DOCUMENTATION CODES Per approved criteria  -Obesity Unspecified   INTERVENTION:  Continue Ensure Complete po BID, each supplement provides 350 kcal and 13 grams of protein RD to follow for nutrition care plan  NUTRITION DIAGNOSIS: Inadequate oral intake related to poor appetite as evidenced by 25% meal intake, improved  Goal: Pt to meet >/= 90% of their estimated nutrition needs, progressing  Monitor:  PO & supplemental intake, weight, labs, I/O's  ASSESSMENT: Pt with history of recently diagnosed atrial fibrillation and placed on apixaban and had cardioversion yesterday morning started experiencing shortness of breath since yesterday evening.   Patient s/p procedure 6/30: CARDIOVERSION  Patient transferred from [facility] to 2W-Cardiac 7/7.  Patient reports her appetite is doing better.  PO intake variable at 25-75% per flowsheet records.  Breathing better per Advanced Heart Failure note.  Drinking her Ensure Complete supplements; had one this AM.  Height: Ht Readings from Last 1 Encounters:  03/03/14 5\' 4"  (1.626 m)    Weight: Wt Readings from Last 1 Encounters:  03/10/14 234 lb 2.1 oz (106.2 kg)    BMI:  Body mass index is 40.17 kg/(m^2).   Estimated Nutritional Needs: Kcal: 1400-1600 Protein: 55-70g Fluid: 1.4-1.6L/day   Skin: Intact  Diet Order: Heart Healthy/Carbohydrate Modified    Intake/Output Summary (Last 24 hours) at 03/10/14 1400 Last data filed at 03/10/14 1200  Gross per 24 hour  Intake 629.83 ml  Output    200 ml  Net 429.83 ml    Labs:   Recent Labs Lab 03/05/14 1710  03/08/14 0300 03/09/14 0312 03/10/14 1036  NA  --   < > 139 141 143  K  --   < > 3.5* 3.5* 3.6*  CL  --   < > 99 103 104  CO2  --   < > 26 26 26   BUN  --   < > 20 20 18   CREATININE  --   < > 2.58* 2.57* 2.28*  CALCIUM  --   < > 8.5 9.0 9.0  MG 2.1  --   --   --   --   PHOS 1.5*  --   --   --   --   GLUCOSE  --   < > 163* 73 202*   < > = values in this interval not displayed.  CBG (last 3)   Recent Labs  03/09/14 1712 03/09/14 2119 03/10/14 0720  GLUCAP 111* 218* 242*    Scheduled Meds: . amiodarone  200 mg Oral BID  . amitriptyline  25 mg Oral QHS  . antiseptic oral rinse  15 mL Mouth Rinse BID  . apixaban  5 mg Oral BID  . atorvastatin  20 mg Oral q1800  . cloNIDine  0.1 mg Oral 3 times per day  . diltiazem  240 mg Oral Daily  . febuxostat  40 mg Oral Daily  . feeding supplement (ENSURE COMPLETE)  237 mL Oral BID BM  . insulin aspart  0-9 Units Subcutaneous TID WC  . insulin aspart  20 Units Subcutaneous TID PC  . insulin glargine  45 Units Subcutaneous Daily  . levalbuterol  0.63 mg Nebulization 4 times per day  . Linaclotide  290 mcg Oral Daily  . metoprolol  50 mg Oral BID  . pantoprazole  40 mg Oral Q1200  . piperacillin-tazobactam (ZOSYN)  IV  3.375 g Intravenous 3 times per day  . sodium chloride  3 mL Intravenous Q12H  Continuous Infusions: . sodium chloride Stopped (03/10/14 1253)  . sodium chloride Stopped (03/10/14 1251)  . sodium chloride Stopped (03/10/14 0541)    Past Medical History  Diagnosis Date  . Atrial flutter   . Hypertension   . Diabetes mellitus   . Diastolic heart failure   . Asthma   . Hyperlipidemia   . Fatty liver   . Esophageal dysmotility   . Arthritis   . Congestive heart failure   . Sleep apnea     wears CPAP  . Family history of malignant neoplasm of gastrointestinal tract   . Fatty tumor fatty tumor back  . Coronary atherosclerosis of native coronary artery   . Morbid obesity   . Myocardial infarction 2009  . Dysrhythmia     afib,flutter hx  . Heart murmur   . Peripheral vascular disease   . Shortness of breath   . Pneumonia     hx  . GERD (gastroesophageal reflux disease)     barrets esophagus  . Anginal pain     occ; non-ischemic Lexiscan 09/2012  . Kidney disease     CKD stage IV (Dr. Erling Cruz)    Past Surgical History   Procedure Laterality Date  . Coronary angioplasty with stent placement    . Breast lumpectomy      right  . Tubal ligation    . Tonsillectomy    . Total knee arthroplasty Right 12/15/2013    Procedure: RIGHT TOTAL KNEE ARTHROPLASTY;  Surgeon: Alta Corning, MD;  Location: [place];  Service: Orthopedics;  Laterality: Right;  . Cardioversion N/A 03/03/2014    Procedure: CARDIOVERSION;  Surgeon: Laverda Page, MD;  Location: [place];  Service: Cardiovascular;  Laterality: N/A;    Arthur Holms, RD, LDN Pager #: [phone] After-Hours Pager #: [phone]

## 2014-03-10 NOTE — Progress Notes (Signed)
Patient off bipap at this time, on [facility]. BIPAP on standby at bedside. RT will continue to monitor

## 2014-03-10 NOTE — Progress Notes (Signed)
S: Feels better today. Pt says her breathing is much better.SOB only when she moves around.  O:BP 117/75  Pulse 84  Temp(Src) 97.9 F (36.6 C) (Oral)  Resp 15  Ht 5\' 4"  (1.626 m)  Wt 234 lb 2.1 oz (106.2 kg)  BMI 40.17 kg/m2  SpO2 98%  Intake/Output Summary (Last 24 hours) at 03/10/14 0832 Last data filed at 03/10/14 0541  Gross per 24 hour  Intake 679.83 ml  Output    850 ml  Net -170.17 ml   Intake/Output: I/O last 3 completed shifts: In: 779.8 [P.O.:480; I.V.:49.8; IV Piggyback:250] Out: 1100 [Urine:1100]  Intake/Output this shift:    Weight change: 1 lb 5.2 oz (0.6 kg)  Physical Exam-  Vitals noted. Gen: NAD, Lying in bed in no distress. HEENT: [place]/AT, mildly dry oral mucosa. [id number], no murmurs appreciated. Resp: normal work of breathing, no added sounds Abd: Full, soft, bowel sounds heard Ext: Trace pitting pedal edema, warm and well perfused, ankle pain- right, not new, pt has a hx of gout, left and right metatarsophalangeal jt invlovement- 1st toe. Neuro: Alert and oriented, moving all extremities.  Recent Labs Lab 03/03/14 1851 03/04/14 0338 03/05/14 0251 03/05/14 1710 03/06/14 0410 03/07/14 0400 03/08/14 0300 03/09/14 0312  NA 142 137 139  --  139 142 139 141  K 4.0 4.4 3.7  --  4.0 3.4* 3.5* 3.5*  CL 104 102 99  --  98 104 99 103  CO2 21 19 22   --  25 24 26 26   GLUCOSE 165* 217* 262*  --  241* 163* 163* 73  BUN 18 19 21   --  21 21 20 20   CREATININE 2.14* 2.14* 2.38*  --  2.43* 2.43* 2.58* 2.57*  ALBUMIN  --  2.9*  --   --   --   --   --   --   CALCIUM 9.4 8.7 9.1  --  8.9 8.0* 8.5 9.0  PHOS  --   --   --  1.5*  --   --   --   --   AST  --  14  --   --   --   --   --   --   ALT  --  7  --   --   --   --   --   --    Liver Function Tests:  Recent Labs Lab 03/04/14 0338  AST 14  ALT 7  ALKPHOS 87  BILITOT 0.5  PROT 6.7  ALBUMIN 2.9*   CBC:  Recent Labs Lab 03/05/14 1710 03/06/14 0410 03/07/14 0400 03/08/14 0300  03/09/14 0312  WBC 15.8* 12.8* 11.4* 10.7* 10.6*  NEUTROABS  --   --  8.1* 6.4 6.1  HGB 12.0 11.1* 9.8* 9.1* 8.6*  HCT 36.4 34.7* 30.6* 27.4* 26.9*  MCV 91.5 92.3 91.9 91.6 91.2  PLT 267 254 260 267 324   Cardiac Enzymes:  Recent Labs Lab 03/03/14 1851 03/03/14 2255 03/04/14 0338 03/04/14 1040  TROPONINI <0.30 <0.30 <0.30 <0.30   CBG:  Recent Labs Lab 03/09/14 1110 03/09/14 1612 03/09/14 1712 03/09/14 2119 03/10/14 0720  GLUCAP 182* 67* 111* 218* 242*   Studies/Results: Dg Chest Port 1 View  03/09/2014   CLINICAL DATA:  Shortness of breath and cough  EXAM: PORTABLE CHEST - 1 VIEW  COMPARISON:  Portable chest x-ray of March 08, 2014  FINDINGS: The lungs are adequately inflated. The interstitial markings while increased have improved since yesterday's study. Densities in  the upper lobes are less conspicuous but persist. The cardiac silhouette remains mildly enlarged. The central pulmonary vascularity is indistinct. The right internal jugular venous catheter has been removed.  IMPRESSION: Improving interstitial edema secondary to CHF. When the patient can tolerate the procedure, a PA and lateral chest x-ray would be of value.   Electronically Signed   By: David  Martinique   On: 03/09/2014 07:33   . amiodarone  400 mg Oral BID  . amitriptyline  25 mg Oral QHS  . antiseptic oral rinse  15 mL Mouth Rinse BID  . apixaban  5 mg Oral BID  . atorvastatin  20 mg Oral q1800  . cloNIDine  0.1 mg Oral 3 times per day  . diltiazem  60 mg Oral 4 times per day  . febuxostat  40 mg Oral Daily  . feeding supplement (ENSURE COMPLETE)  237 mL Oral BID BM  . insulin aspart  0-9 Units Subcutaneous TID WC  . insulin aspart  20 Units Subcutaneous TID PC  . insulin glargine  45 Units Subcutaneous Daily  . levalbuterol  0.63 mg Nebulization 4 times per day  . Linaclotide  290 mcg Oral Daily  . metoprolol  50 mg Oral BID  . pantoprazole  40 mg Oral Q1200  . piperacillin-tazobactam (ZOSYN)  IV  3.375  g Intravenous 3 times per day  . sodium chloride  3 mL Intravenous Q12H    BMET    Component Value Date/Time   NA 141 03/09/2014 0312   K 3.5* 03/09/2014 0312   CL 103 03/09/2014 0312   CO2 26 03/09/2014 0312   GLUCOSE 73 03/09/2014 0312   BUN 20 03/09/2014 0312   CREATININE 2.57* 03/09/2014 0312   CALCIUM 9.0 03/09/2014 0312   GFRNONAA 18* 03/09/2014 0312   GFRAA 21* 03/09/2014 0312   CBC    Component Value Date/Time   WBC 10.6* 03/09/2014 0312   RBC 2.95* 03/09/2014 0312   HGB 8.6* 03/09/2014 0312   HCT 26.9* 03/09/2014 0312   PLT 324 03/09/2014 0312   MCV 91.2 03/09/2014 0312   MCH 29.2 03/09/2014 0312   MCHC 32.0 03/09/2014 0312   RDW 14.9 03/09/2014 0312   LYMPHSABS 2.2 03/09/2014 0312   MONOABS 1.2* 03/09/2014 0312   EOSABS 1.0* 03/09/2014 0312   BASOSABS 0.0 03/09/2014 0312     Assessment/Plan:  1. Acute respiratory failure- likely aspiration PNA after Cardioversion, on Zosyn. Differential- pulmonary edema- suggested on chest xray. Consider resuming home torsemide 40 BID. Weight- 234 today. Weight in April- 239-240.   2. CKD4- Cr trending up today 2.58 >> 2.43 yesterday, BUN stable, holding lasix.     3. DM- Per primary team.   4. HTN- Blood pressure WNL, management per primary  5. Atria fib- likely driven by PNA.  Management per cards. On metop, amiodarone and diltiazem.  6. Anemia- hgb continues to drop, 12.7--> 8.6 today. Likely from bleeding from previous central line site, d/c yesterday. Per PCCM transfuse if hgb falls below 7. On apixaban, consider holding if Hgb continues to drop, till HgB stabilizes. CBC today pending.  Denton Brick, Latajah Thuman,MD PGY-2, IMTS 03/10/2014 8:32 AM

## 2014-03-10 NOTE — Evaluation (Signed)
Physical Therapy Evaluation Patient Details Name: [name] MRN: [id number] DOB: 14-Apr-1946 Today's Date: 03/10/2014   History of Present Illness  Pt is a 68 y/o female admitted with acute respiratory failure. 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   Clinical Impression  Pt admitted with the above. Pt currently with functional limitations due to the deficits listed below (see PT Problem List). At the time of PT eval, pt demonstrated a significant decline in tolerance for functional activity compared to her reported baseline. Rolling chair was pulled up in hallway for an emergent seated rest break during gait training. After ~2 minutes of sitting, pt wanted to attempt ambulating back to the room and was very successful. Pt will benefit from skilled PT to increase their independence and safety with mobility to allow discharge to the venue listed below.       Follow Up Recommendations Home health PT;Supervision for mobility/OOB    Equipment Recommendations  None recommended by PT    Recommendations for Other Services       Precautions / Restrictions Precautions Precautions: Fall Restrictions Weight Bearing Restrictions: No      Mobility  Bed Mobility Overal bed mobility: Needs Assistance Bed Mobility: Supine to Sit;Sit to Supine     Supine to sit: Supervision Sit to supine: Supervision   General bed mobility comments: Increased time and heavy use of bed rails for assist when transitioning to/from EOB.   Transfers Overall transfer level: Needs assistance Equipment used: Rolling walker (2 wheeled) Transfers: Sit to/from Stand Sit to Stand: Min guard         General transfer comment: Pt was able to power-up to full standing position without physical assist. VC's for hand placement on seated surface for safety.   Ambulation/Gait Ambulation/Gait assistance: Mod assist Ambulation Distance (Feet): 35 Feet (x2) Assistive device: Rolling  walker (2 wheeled) Gait Pattern/deviations: Step-through pattern;Decreased stride length;Decreased step length - left;Trunk flexed Gait velocity: Decreased Gait velocity interpretation: Below normal speed for age/gender General Gait Details: Pt able to ambulate 35'' taking 1 standing rest break before requiring a seated rest break. Pt was cued for pursed-lip breathing throughout gait training, with O2 sats dropping to 87% briefly, while on RA. Pt sat for ~2 minutes before ambulating back to room without taking any rest breaks.   Stairs            Wheelchair Mobility    Modified Rankin (Stroke Patients Only)       Balance Overall balance assessment: Needs assistance Sitting-balance support: Feet supported;No upper extremity supported Sitting balance-Leahy Scale: Fair     Standing balance support: Bilateral upper extremity supported;During functional activity Standing balance-Leahy Scale: Poor                               Pertinent Vitals/Pain Vitals stable throughout session. See gait training details for O2 sats on RA.     Home Living Family/patient expects to be discharged to:: Private residence Living Arrangements: Children Available Help at Discharge: Family;Available PRN/intermittently Type of Home: Apartment Home Access: Stairs to enter Entrance Stairs-Rails: Right Entrance Stairs-Number of Steps: 22 Home Layout: One level Home Equipment: Walker - standard;Walker - 2 wheels;Walker - 4 wheels;Tub bench      Prior Function Level of Independence: Independent with assistive device(s)         Comments: Required increased time due to recent TKA in April     Hand  Dominance   Dominant Hand: Right    Extremity/Trunk Assessment   Upper Extremity Assessment: Defer to OT evaluation           Lower Extremity Assessment: Generalized weakness RLE Deficits / Details: Decreased strength and AROM consistent with TKA in April.     Cervical /  Trunk Assessment: Kyphotic  Communication   Communication: No difficulties  Cognition Arousal/Alertness: Awake/alert Behavior During Therapy: WFL for tasks assessed/performed Overall Cognitive Status: Within Functional Limits for tasks assessed                      General Comments      Exercises        Assessment/Plan    PT Assessment Patient needs continued PT services  PT Diagnosis Difficulty walking;Generalized weakness   PT Problem List Decreased strength;Decreased range of motion;Decreased activity tolerance;Decreased balance;Decreased mobility;Decreased knowledge of use of DME;Decreased safety awareness;Decreased knowledge of precautions  PT Treatment Interventions DME instruction;Gait training;Stair training;Functional mobility training;Therapeutic activities;Therapeutic exercise;Neuromuscular re-education;Patient/family education   PT Goals (Current goals can be found in the Care Plan section) Acute Rehab PT Goals Patient Stated Goal: To return home PT Goal Formulation: With patient Time For Goal Achievement: 03/24/14 Potential to Achieve Goals: Good    Frequency Min 3X/week   Barriers to discharge        Co-evaluation               End of Session Equipment Utilized During Treatment: Gait belt;Oxygen Activity Tolerance: Patient limited by fatigue Patient left: in bed;with call bell/phone within reach;with family/visitor present Nurse Communication: Mobility status;Other (comment) (O2 status)         Time: 0923-1000 PT Time Calculation (min): 37 min   Charges:   PT Evaluation $Initial PT Evaluation Tier I: 1 Procedure PT Treatments $Gait Training: 8-22 mins $Therapeutic Activity: 8-22 mins   PT G Codes:          Jolyn Lent 03/10/2014, 3:06 PM  Jolyn Lent, PT, DPT Acute Rehabilitation Services Pager: [phone]

## 2014-03-10 NOTE — Progress Notes (Signed)
I have personally seen and examined this patient and agree with the assessment/plan as outlined above by Denton Brick MD (PGY2). Plan to restart home dose of torsemide today and monitor UOP/renal function. Labs pending from this morning.   Kalee Broxton K.,MD  03/10/2014 9:45 AM

## 2014-03-10 NOTE — Progress Notes (Signed)
Pt placed on CPAP (7-16 cm H2O Auto) with 2 lpm O2 bleed-in via medium FFM.  Pt states she is comfortable.  Currently stable and tolerating well.  RT to monitor as needed.

## 2014-03-10 NOTE — Progress Notes (Addendum)
PULMONARY / CRITICAL CARE MEDICINE   Name: [name] MRN: [id number] DOB: 01/20/1946    ADMISSION DATE:  03/03/2014 CONSULTATION DATE:  7/02  REFERRING MD :  Fanny Bien PRIMARY SERVICE: IM  CHIEF COMPLAINT:  Shortness of breath and chest pain   BRIEF PATIENT DESCRIPTION: 68 y.o. female with recently diagnosed with A-fib/flutter (apixaban) who [facility] with chest pain and shortness of breath after a scheduled cardioversion. Being treated for possible aspiration pneumonia vs pulm edema. 7/02 PCCM consulted for worsening shortness of breath and increasing O2 requirements.   SIGNIFICANT EVENTS / STUDIES:  6/30 admitted with SOB, CP after scheduled cardioversion - CXR positive for possible aspiration PNA vs pulm edema 7/02 PCCM consulted for increased work of breathing and increasing oxygen requirements.  7/5- off daytime BIPAP, on 2 liters O2  LINES / TUBES: PIVs Central line 7/02 >>>7/5  CULTURES: Blood cultures 7/02 >>>neg  ANTIBIOTICS: Cefepime 7/1 >>> 7/2 Vancomycin 7/1 >>>7/4 Zosyn 7/2 >>>7/7  SUBJECTIVE:  No distress, appears well  VITAL SIGNS: Temp:  [97.5 F (36.4 C)-98.5 F (36.9 C)] 97.9 F (36.6 C) (07/07 0700) Pulse Rate:  [33-111] 84 (07/07 0800) Resp:  [13-39] 15 (07/07 0803) BP: (95-159)/(38-106) 117/75 mmHg (07/07 0800) SpO2:  [94 %-100 %] 98 % (07/07 0803) FiO2 (%):  [40 %] 40 % (07/07 0147) Weight:  [106.2 kg (234 lb 2.1 oz)] 106.2 kg (234 lb 2.1 oz) (07/07 0600) HEMODYNAMICS: cv stable , back in AF   VENTILATOR SETTINGS: Vent Mode:  [-]  FiO2 (%):  [40 %] 40 % INTAKE / OUTPUT: Intake/Output     07/06 0701 - 07/07 0700 07/07 0701 - 07/08 0700   P.O. 480    I.V. (mL/kg) 49.8 (0.5)    IV Piggyback 150    Total Intake(mL/kg) 679.8 (6.4)    Urine (mL/kg/hr) 850 (0.3)    Total Output 850     Net -170.2          Urine Occurrence 3 x    Stool Occurrence 5 x     PHYSICAL EXAMINATION: General: no acute distress Neuro:  A/Ox4, anxious affect, follows all commands HEENT: PERRL, on Nasal cann O2 Cardiovascular: s1 s2 irr Lungs:coarse left mild Abdomen: Rotund, non-tender, no r/g Musculoskeletal: no sig edema Skin: intact  LABS:  CBC  Recent Labs Lab 03/07/14 0400 03/08/14 0300 03/09/14 0312  WBC 11.4* 10.7* 10.6*  HGB 9.8* 9.1* 8.6*  HCT 30.6* 27.4* 26.9*  PLT 260 267 324   Coag's  Recent Labs Lab 03/05/14 2330 03/06/14 0450 03/07/14 0400  APTT  --  48*  --   INR 2.01* 2.12* 2.06*   BMET  Recent Labs Lab 03/07/14 0400 03/08/14 0300 03/09/14 0312  NA 142 139 141  K 3.4* 3.5* 3.5*  CL 104 99 103  CO2 24 26 26   BUN 21 20 20   CREATININE 2.43* 2.58* 2.57*  GLUCOSE 163* 163* 73   Electrolytes  Recent Labs Lab 03/05/14 1710  03/07/14 0400 03/08/14 0300 03/09/14 0312  CALCIUM  --   < > 8.0* 8.5 9.0  MG 2.1  --   --   --   --   PHOS 1.5*  --   --   --   --   < > = values in this interval not displayed. Sepsis Markers  Recent Labs Lab 03/03/14 1851 03/05/14 0251 03/05/14 1710  LATICACIDVEN  --   --  1.6  PROCALCITON <0.10 0.11  --  ABG  Recent Labs Lab 03/05/14 1427  PHART 7.468*  PCO2ART 33.2*  PO2ART 56.0*   Liver Enzymes  Recent Labs Lab 03/04/14 0338  AST 14  ALT 7  ALKPHOS 87  BILITOT 0.5  ALBUMIN 2.9*   Cardiac Enzymes  Recent Labs Lab 03/03/14 1851 03/03/14 2255 03/04/14 0338 03/04/14 1040  TROPONINI <0.30 <0.30 <0.30 <0.30  PROBNP 1531.0*  --   --   --    Glucose  Recent Labs Lab 03/09/14 0952 03/09/14 1110 03/09/14 1612 03/09/14 1712 03/09/14 2119 03/10/14 0720  GLUCAP 194* 182* 49* 111* 218* 242*    Imaging All imaging in epic system reviewed past 48hrs pcxr 7/6- diffuse int changes left grater rt, fluid fissure rt  ASSESSMENT / PLAN: Principal Problem:   Acute respiratory failure Active Problems:   Obstructive sleep apnea   Chronic kidney disease (CKD), stage IV (severe)   DM (diabetes mellitus), type 2 with  renal complications   Chest pain   Atrial fibrillation with RVR   Aspiration pneumonia   Acute diastolic heart failure   Acute pulmonary edema   Coagulopathy   Acute blood loss anemia   PULMONARY A: Acute respiratory failure d/t aspiration pna (after cardioversion) / pulm edema  Pulm edema less H/o sleep apnea P:    Lasix per renal xopenex in setting of AF O2 to sats goal 92% Flutter valve cpap nocturnal rx as outpt Will discuss with cardiology risks further cardioversion  CARDIOVASCULAR A:  Atrial fibrillation - on Cardizem gtt and amiodarone gtt Hypertension H/o diastolic heart failure Echo : EF 560-65%  LV stiff.   P:  Cards is following amiodarone PO Low dose metoprolol PRN for HR > 120 Lasix held, per renal Will discuss with cards cardioversion in future  RENAL A:   Acute on chronic renal failure? Acute renal failure, atn likely, r/o from prior diuresis P:   Lasix per renal in future Chem in am  Have seen a plat in crt hope for some declines  GASTROINTESTINAL A:   H/o GERD (barretts esophagus) - questionable contribution to aspiration? IBS P:    PPI  Diet SLP planned  HEMATOLOGIC A: Bleeding from R IJ CVL-resolved fib Acute blood loss anemia   Recent Labs Lab 03/05/14 1710 03/06/14 0410 03/07/14 0400 03/08/14 0300 03/09/14 0312  HGB 12.0 11.1* 9.8* 9.1* 8.6*    P:  Resumed apixaban 7/3  INFECTIOUS A:    aspiration pneumonia  Improved WBC and fever curve Note pcn "allergy" only nausea  P:   cont zosyn, establish stop date, done 7th, allow to dc  ENDOCRINE A:   Diabetes 2 with renal complication P:   SSI  NEUROLOGIC A:   H/o Depression H/o insomina Chronic pain? P:   Continued to Hold home amitriptyline Prn ambien Morphine PRN    TODAY'S SUMMARY: consider move to tele if no cardioversion planned  Will sign off to teaching service if move out of icu and no cardioversion planned  Lavon Paganini. Titus Mould, MD, [place] Pgr:  [facility]

## 2014-03-10 NOTE — Progress Notes (Signed)
Came to visit patient at bedside after transferred from [facility]. Met with both patient and daughter, [name]. Patient is active with [place] [facility] services. Made her aware that [facility] will continue to follow. Daughter, [name] asked that she be called regarding plan of care and so forth as patient tends to forget and daughter is often not in the room when MD rounds. [employer] cell number is [phone]. Will pass information along.  Marthenia Rolling, MSN- RN,BSN- [facility] [phone]

## 2014-03-11 ENCOUNTER — Inpatient Hospital Stay (HOSPITAL_COMMUNITY): Payer: Medicare HMO

## 2014-03-11 LAB — CBC
HCT: 27.4 % — ABNORMAL LOW (ref 36.0–46.0)
Hemoglobin: 8.8 g/dL — ABNORMAL LOW (ref 12.0–15.0)
MCH: 29.5 pg (ref 26.0–34.0)
MCHC: 32.1 g/dL (ref 30.0–36.0)
MCV: 91.9 fL (ref 78.0–100.0)
Platelets: 410 10*3/uL — ABNORMAL HIGH (ref 150–400)
RBC: 2.98 MIL/uL — ABNORMAL LOW (ref 3.87–5.11)
RDW: 15.2 % (ref 11.5–15.5)
WBC: 9.7 10*3/uL (ref 4.0–10.5)

## 2014-03-11 LAB — BASIC METABOLIC PANEL
Anion gap: 14 (ref 5–15)
BUN: 17 mg/dL (ref 6–23)
CO2: 24 mEq/L (ref 19–32)
Calcium: 9.3 mg/dL (ref 8.4–10.5)
Chloride: 104 mEq/L (ref 96–112)
Creatinine, Ser: 2.34 mg/dL — ABNORMAL HIGH (ref 0.50–1.10)
GFR calc Af Amer: 24 mL/min — ABNORMAL LOW (ref >90)
GFR calc non Af Amer: 20 mL/min — ABNORMAL LOW (ref >90)
Glucose, Bld: 170 mg/dL — ABNORMAL HIGH (ref 70–99)
Potassium: 3.9 mEq/L (ref 3.7–5.3)
Sodium: 142 mEq/L (ref 137–147)

## 2014-03-11 LAB — GLUCOSE, CAPILLARY
Glucose-Capillary: 115 mg/dL — ABNORMAL HIGH (ref 70–99)
Glucose-Capillary: 129 mg/dL — ABNORMAL HIGH (ref 70–99)
Glucose-Capillary: 190 mg/dL — ABNORMAL HIGH (ref 70–99)
Glucose-Capillary: 241 mg/dL — ABNORMAL HIGH (ref 70–99)
Glucose-Capillary: 68 mg/dL — ABNORMAL LOW (ref 70–99)

## 2014-03-11 LAB — CULTURE, BLOOD (ROUTINE X 2)
Culture: NO GROWTH
Culture: NO GROWTH

## 2014-03-11 MED ORDER — POTASSIUM CHLORIDE CRYS ER 20 MEQ PO TBCR: 20.0000 meq | EXTENDED_RELEASE_TABLET | Freq: Two times a day (BID) | ORAL | Status: DC

## 2014-03-11 MED ORDER — XOPENEX HFA 45 MCG/ACT IN AERO: 2.0000 | INHALATION_SPRAY | RESPIRATORY_TRACT | Status: DC | PRN

## 2014-03-11 MED ORDER — FUROSEMIDE 10 MG/ML IJ SOLN
40.0000 mg | Freq: Once | INTRAMUSCULAR | Status: AC
  Administered 2014-03-11: 40 mg via INTRAVENOUS
  Filled 2014-03-11: qty 4

## 2014-03-11 MED ORDER — POTASSIUM CHLORIDE CRYS ER 20 MEQ PO TBCR: 20.0000 meq | EXTENDED_RELEASE_TABLET | Freq: Every day | ORAL | Status: DC

## 2014-03-11 MED ORDER — AMIODARONE HCL 200 MG PO TABS: 200.0000 mg | ORAL_TABLET | Freq: Two times a day (BID) | ORAL | Status: DC

## 2014-03-11 MED ORDER — DILTIAZEM HCL ER COATED BEADS 240 MG PO CP24: 240.0000 mg | ORAL_CAPSULE | Freq: Every day | ORAL | Status: DC

## 2014-03-11 MED ORDER — CLONIDINE HCL 0.1 MG PO TABS: 0.3000 mg | ORAL_TABLET | Freq: Three times a day (TID) | ORAL | Status: DC

## 2014-03-11 NOTE — Discharge Summary (Addendum)
Physician Discharge Summary  Patient ID: [name] MRN: [id number] DOB/AGE: Jan 12, 1946 68 y.o.  Admit date: 03/03/2014 Discharge date: 03/13/2014    Discharge Diagnoses:  Principal Problem:   Acute respiratory failure Active Problems:   Obstructive sleep apnea   Chronic kidney disease (CKD), stage IV (severe)   DM (diabetes mellitus), type 2 with renal complications   Chest pain   Atrial fibrillation with RVR   Aspiration pneumonia   Acute diastolic heart failure   Acute pulmonary edema   Coagulopathy   Acute blood loss anemia    Brief Summary: [name] is a 68 y.o. y/o female with a PMH of AFib/Flutter presented 6/30 with chest pain and SOB after a scheduled cardioversion.  She was admitted by [facility] and treated with abx for possible aspiration PNA v pulm edema.  On 7/2 she developed worsening SOB and increased O2 requirements and PCCM consulted.  She was started on bipap, Zosyn, diuresed aggressively and HR better controlled.  Respiratory status was tenuous but she did not require intubation and improved quickly.  Course was c/b acute on chronic renal failure and she was followed closely by renal, lasix held with some improvement and recommendations from renal to resume home torsemide.  Course also c/b bleeding at CVL site which resolved with short term holding of apixaban and ultimately removal of CVL.  She was also followed closely by cardiology who managed diuresis and HR control.  They will f/u as outpt and ensure overall recovery from resp failure and asp PNA prior to further attempts at cardioversion.  She is ambulating in hallways, O2 sats >90% on RA and at baseline resp status per pt despite c/o mild SOB (says this is actually worse at baseline).  She will f/u with PCP and be monitored closely by cardiology in heart failure clinic.   SIGNIFICANT EVENTS / STUDIES:  6/30 admitted with SOB, CP after scheduled cardioversion - CXR positive for possible aspiration PNA vs pulm  edema  7/02 PCCM consulted for increased work of breathing and increasing oxygen requirements.  7/5- off daytime BIPAP, on 2 liters O2   LINES / TUBES:  PIVs  Central line 7/02 >>>7/5   CULTURES:  Blood cultures 7/02 >>>neg   ANTIBIOTICS:  Cefepime 7/1 >>> 7/2  Vancomycin 7/1 >>>7/4  Zosyn 7/2 >>>7/7                                                                    D/c plan by Discharge Diagnosis  Acute respiratory failure - resolved  Aspiration PNA OSA  D/c plan -  S/p 7 day course abx  PCP f/u  CPAP qhs as prior to admit  She will go to skilled nursing facility for further rehabilitation. Acute on chronic diastolic CHF  HTN  AFib - s/p failed cardioversion 6/30 D/c plan --  Demadex, amiodarone, lopressor, diltiazem, clonidine as below  outpt f/u  No plans as of yet for repeat cardioversion until further recovery from asp PNA/ resp failure    CKD stage IV  D/c plan --  Resume home torsemide as above  outpt renal f/u   She will be treated per SNF MD.   BP 105/74  Pulse 92  Temp(Src) 98 F (36.7 C) (Oral)  Resp 18  Ht 5' 4"  (1.626 m)  Wt 232 lb 2.3 oz (105.3 kg)  BMI 39.83 kg/m2  SpO2 95%  Discharge Labs  BMET  Recent Labs Lab 03/09/14 0312 03/10/14 1036 03/11/14 0355 03/12/14 0404 03/13/14 0641  NA 141 143 142 145 145  K 3.5* 3.6* 3.9 3.7 3.4*  CL 103 104 104 106 105  CO2 26 26 24 25 25   GLUCOSE 73 202* 170* 124* 121*  BUN 20 18 17 18 16   CREATININE 2.57* 2.28* 2.34* 2.26* 2.10*  CALCIUM 9.0 9.0 9.3 9.4 9.2     CBC   Recent Labs Lab 03/11/14 0355 03/12/14 0404 03/13/14 0641  HGB 8.8* 8.8* 9.3*  HCT 27.4* 28.4* 29.8*  WBC 9.7 9.7 8.9  PLT 410* 474* 499*   Anti-Coagulation  Recent Labs Lab 03/07/14 0400  INR 2.06*      Discharge Instructions   (HEART FAILURE PATIENTS) Call MD:  Anytime you have any of the following symptoms: 1) 3 pound weight gain in 24 hours or 5 pounds in 1 week 2) shortness of breath, with or  without a dry hacking cough 3) swelling in the hands, feet or stomach 4) if you have to sleep on extra pillows at night in order to breathe.    Complete by:  As directed      Call MD for:  difficulty breathing, headache or visual disturbances    Complete by:  As directed      Call MD for:  extreme fatigue    Complete by:  As directed      Call MD for:  persistant dizziness or light-headedness    Complete by:  As directed      Call MD for:  temperature >100.4    Complete by:  As directed      Diet - low sodium heart healthy    Complete by:  As directed      Increase activity slowly    Complete by:  As directed                 Follow-up Information   Follow up with [facility] On 03/18/2014. (@ 9:45 am. Heart Failure Clinic is located in the hospital. Please bring all of your medications to your visit. Gate code 4000. )    Specialty:  Cardiology   Contact information:   [address] [id number] [place] [address] [phone]      Follow up with [name], MD. Schedule an appointment as soon as possible for a visit in 2 weeks.   Specialty:  Internal Medicine   Contact information:   [place], [place] [address] [phone]       Follow up with Estanislado Emms, MD On 04/21/2014. (Appointment at [facility])    Specialty:  Nephrology   Contact information:   [address]                          [place] [address] [phone]          Medication List    STOP taking these medications       albuterol 108 (90 BASE) MCG/ACT inhaler  Commonly known as:  PROVENTIL HFA;VENTOLIN HFA     amLODipine 10 MG tablet  Commonly known as:  NORVASC     clopidogrel 75 MG tablet  Commonly known as:  PLAVIX      TAKE these medications  acetaminophen 500 MG tablet  Commonly known as:  TYLENOL  Take 500 mg by mouth every 6 (six) hours as needed for mild pain.     amiodarone 200 MG tablet   Commonly known as:  PACERONE  Take 1 tablet (200 mg total) by mouth 2 (two) times daily.     amitriptyline 25 MG tablet  Commonly known as:  ELAVIL  Take 25 mg by mouth at bedtime.     cloNIDine 0.1 MG tablet  Commonly known as:  CATAPRES  Take 3 tablets (0.3 mg total) by mouth 3 (three) times daily.     dicyclomine 10 MG capsule  Commonly known as:  BENTYL  Take 10 mg by mouth 4 (four) times daily as needed (IBS).     diltiazem 240 MG 24 hr capsule  Commonly known as:  CARDIZEM CD  Take 1 capsule (240 mg total) by mouth daily.     ELIQUIS 5 MG Tabs tablet  Generic drug:  apixaban  Take 5 mg by mouth 2 (two) times daily.     esomeprazole 40 MG capsule  Commonly known as:  NEXIUM  Take 40 mg by mouth daily at 12 noon.     febuxostat 40 MG tablet  Commonly known as:  ULORIC  Take 40 mg by mouth daily.     insulin glargine 100 UNIT/ML injection  Commonly known as:  LANTUS  Inject 0.4 mLs (40 Units total) into the skin daily.     insulin lispro 100 UNIT/ML injection  Commonly known as:  HUMALOG  Inject 0.2 mLs (20 Units total) into the skin 3 (three) times daily after meals.     Linaclotide 145 MCG Caps capsule  Commonly known as:  LINZESS  Take 290 mcg by mouth daily.     methocarbamol 750 MG tablet  Commonly known as:  ROBAXIN-750  Take 1 tablet (750 mg total) by mouth every 8 (eight) hours as needed for muscle spasms.     metoprolol 50 MG tablet  Commonly known as:  LOPRESSOR  Take 50 mg by mouth 2 (two) times daily.     nitroGLYCERIN 0.4 MG SL tablet  Commonly known as:  NITROSTAT  Place 0.4 mg under the tongue every 5 (five) minutes as needed for chest pain.     oxyCODONE-acetaminophen 5-325 MG per tablet  Commonly known as:  PERCOCET/ROXICET  Take 1-2 tablets by mouth every 6 (six) hours as needed for severe pain.     potassium chloride SA 20 MEQ tablet  Commonly known as:  K-DUR,KLOR-CON  Take 1 tablet (20 mEq total) by mouth 2 (two) times daily.      ranitidine 150 MG tablet  Commonly known as:  ZANTAC  Take 1 tablet (150 mg total) by mouth at bedtime.     rosuvastatin 10 MG tablet  Commonly known as:  CRESTOR  Take 10 mg by mouth daily.     torsemide 20 MG tablet  Commonly known as:  DEMADEX  Take 40 mg by mouth 2 (two) times daily.     Vitamin D (Ergocalciferol) 50000 UNITS Caps capsule  Commonly known as:  DRISDOL  Take 50,000 Units by mouth every Monday.     XOPENEX HFA 45 MCG/ACT inhaler  Generic drug:  levalbuterol  Inhale 2 puffs into the lungs every 4 (four) hours as needed for wheezing or shortness of breath.     zolpidem 10 MG tablet  Commonly known as:  AMBIEN  Take 10 mg by mouth at  bedtime as needed for sleep.          Disposition: Skilled nursing facility.  Discharged Condition: [name] has met maximum benefit of inpatient care and is medically stable and cleared for discharge.  Patient is pending follow up as above.      Time spent on disposition:  Greater than 35 minutes.   Signed: Richardson Landry Minor ACNP Maryanna Shape PCCM Pager [phone] till 3 pm If no answer page [phone] 03/13/2014, 12:46 PM   *Care during the described time interval was provided by me and/or other providers on the critical care team. I have reviewed this patient's available data, including medical history, events of note, physical examination and test results as part of my evaluation.     Attending:  I have seen and examined the patient with nurse practitioner/resident and agree with the note above.    Roselie Awkward, MD [place] PCCM Pager: [phone] Cell: [phone] If no response, call [phone]

## 2014-03-11 NOTE — Progress Notes (Signed)
Speech Language Pathology Treatment: Dysphagia  Patient Details Name: [name] MRN: [id number] DOB: 1946-06-27 Today's Date: 03/11/2014 Time: 5631-4970 SLP Time Calculation (min): 10 min  Assessment / Plan / Recommendation Clinical Impression  Patient sitting in chair with daughters at side.  SLP facilitated session with skilled observation of PO trials: regular textures and thin liquids with intermittent cues for pacing and use of alternating solids and liquids.  Patient with no overt s/s of pharyngeal dysphagia.  However, later in session patient reported globus sensation followed by a dry cough which she reports was her attempt to hock up what she felt was in her throat.  SLP re-educated patient and discussed with family recommendation for GI follow up given her history of GERD, esophageal dysmotility and [name]'s esophagus.  Patient presenting with a suspected primary esophageal based dysphagia.  Will defer decision for further instrumental testing to MD.  Patient and family verbalized understanding of information and reported thtat they would follow up with primary GI MD as outpatient.     HPI HPI: 68 y.o. female with recently diagnosed with A-fib/flutter (apixaban) who [facility] with Acute respiratory failure d/t aspiration pna (after cardioversion) Vs pulm edema vs both - chest pain and shortness of breath after a scheduled cardioversion. Being treated for possible aspiration pneumonia vs pulm edema. 7/02 noted worsening shortness of breath and increasing O2 requirements.  Note also a h.o. GERD and barret's esophagus.  Note that CXR 4/15 with questionable pneumonitis and 4/17 with bilateral airspace opacities. Most recent CXR this admission wtih question of underlying multifocal PNA.   Pertinent Vitals none  SLP Plan  All goals met;Discharge SLP treatment due to (comment) (pt discharging from hospital today )    Recommendations Compensations: Slow rate;Small  sips/bites;Follow solids with liquid Postural Changes and/or Swallow Maneuvers: Seated upright 90 degrees;Upright 30-60 min after meal             Oral Care Recommendations: Oral care BID Follow up Recommendations: Other (comment) (primary GI ) Plan: All goals met;Discharge SLP treatment due to (comment) (pt discharging from hospital today )    GO     Gunnar Fusi, M.A., [place]  [place] 03/11/2014, 2:26 PM

## 2014-03-11 NOTE — Progress Notes (Signed)
PTARR stated that pt was c/o SOB, and wheezes and that she desats in the 80's. Applied o2 and brought back to floor. NP Darlina Sicilian on floor is aware. Orders to discontinue discharge and obtain cxr and resume orders.

## 2014-03-11 NOTE — Progress Notes (Signed)
Advanced Heart Failure Rounding Note   Subjective:    Much improved. Breathing better. Ambulating hall.  Remains in AF. Rate  80-90ss on po Amio and diltiazem.  Renal function and HGb improved.   Echo with EF 60-65%. RV ok.    Objective:   Weight Range:  Vital Signs:   Temp:  [97.8 F (36.6 C)-98.4 F (36.9 C)] 98.2 F (36.8 C) (07/08 0430) Pulse Rate:  [89-110] 98 (07/08 0430) Resp:  [18-20] 20 (07/08 0430) BP: (115-134)/(57-83) 130/73 mmHg (07/08 0430) SpO2:  [92 %-100 %] 98 % (07/08 0722) Weight:  [105.688 kg (233 lb)] 105.688 kg (233 lb) (07/08 0430) Last BM Date: 03/09/14  Weight change: Filed Weights   03/09/14 0400 03/10/14 0600 03/11/14 0430  Weight: 105.6 kg (232 lb 12.9 oz) 106.2 kg (234 lb 2.1 oz) 105.688 kg (233 lb)    Intake/Output:   Intake/Output Summary (Last 24 hours) at 03/11/14 0930 Last data filed at 03/10/14 2139  Gross per 24 hour  Intake    480 ml  Output    400 ml  Net     80 ml    Physical Exam:  General: Obese woman lying in bedn nasal cannula. More comfortable HEENT: normal  Neck: supple. JVP hard to see Carotids 2+ bilat; no bruits. No lymphadenopathy or thryomegaly appreciated. Cor: PMI nonpalpbale. Distant. Irregular. No obvious murmur  Lungs:  Clear with long exp phase Abdomen: Obese soft, nontender,. No hepatosplenomegaly. No bruits or masses. Good bowel sounds.  Extremities: no cyanosis, clubbing, rash, warm. No edema Neuro: alert & orientedx3, cranial nerves grossly intact. moves all 4 extremities w/o difficulty. Affect pleasant    Telemetry: A fib 80-90s  Labs: Basic Metabolic Panel:  Recent Labs Lab 03/05/14 1710  03/07/14 0400 03/08/14 0300 03/09/14 0312 03/10/14 1036 03/11/14 0355  NA  --   < > 142 139 141 143 142  K  --   < > 3.4* 3.5* 3.5* 3.6* 3.9  CL  --   < > 104 99 103 104 104  CO2  --   < > 24 26 26 26 24   GLUCOSE  --   < > 163* 163* 73 202* 170*  BUN  --   < > 21 20 20 18 17   CREATININE  --   < > 2.43*  2.58* 2.57* 2.28* 2.34*  CALCIUM  --   < > 8.0* 8.5 9.0 9.0 9.3  MG 2.1  --   --   --   --   --   --   PHOS 1.5*  --   --   --   --   --   --   < > = values in this interval not displayed.  Liver Function Tests: No results found for this basename: AST, ALT, ALKPHOS, BILITOT, PROT, ALBUMIN,  in the last 168 hours No results found for this basename: LIPASE, AMYLASE,  in the last 168 hours No results found for this basename: AMMONIA,  in the last 168 hours  CBC:  Recent Labs Lab 03/07/14 0400 03/08/14 0300 03/09/14 0312 03/10/14 1036 03/11/14 0355  WBC 11.4* 10.7* 10.6* 8.1 9.7  NEUTROABS 8.1* 6.4 6.1  --   --   HGB 9.8* 9.1* 8.6* 8.7* 8.8*  HCT 30.6* 27.4* 26.9* 27.5* 27.4*  MCV 91.9 91.6 91.2 91.4 91.9  PLT 260 267 324 380 410*    Cardiac Enzymes:  Recent Labs Lab 03/04/14 1040  TROPONINI <0.30    BNP: BNP (last 3  results)  Recent Labs  03/03/14 1851  PROBNP 1531.0*     Other results:    Imaging: No results found.   Medications:     Scheduled Medications: . amiodarone  200 mg Oral BID  . amitriptyline  25 mg Oral QHS  . antiseptic oral rinse  15 mL Mouth Rinse BID  . apixaban  5 mg Oral BID  . atorvastatin  20 mg Oral q1800  . cloNIDine  0.1 mg Oral 3 times per day  . diltiazem  240 mg Oral Daily  . febuxostat  40 mg Oral Daily  . feeding supplement (ENSURE COMPLETE)  237 mL Oral BID BM  . insulin aspart  0-9 Units Subcutaneous TID WC  . insulin aspart  20 Units Subcutaneous TID PC  . insulin glargine  45 Units Subcutaneous Daily  . levalbuterol  0.63 mg Nebulization 4 times per day  . Linaclotide  290 mcg Oral Daily  . metoprolol  50 mg Oral BID  . pantoprazole  40 mg Oral Q1200  . sodium chloride  3 mL Intravenous Q12H    Infusions: . sodium chloride Stopped (03/10/14 1253)  . sodium chloride Stopped (03/10/14 1251)  . sodium chloride Stopped (03/10/14 0541)    PRN Medications: acetaminophen, acetaminophen, HYDROcodone-homatropine,  methocarbamol, morphine injection, ondansetron (ZOFRAN) IV, ondansetron, oxyCODONE-acetaminophen, zolpidem   Assessment:  1. Acute respiratory failure  2. A/c chronic diastolic HF  3. Probable aspiration PNA  4. Chronic renal failure, stage IV  5. Obesity  6. AF s/p DC-CV on 6/30  7. HTN  8. DM2 9. Hypokalemia   Plan/Discussion:    She is much improved. Likely ready to be d/c'd today on home torsemide 40 bid starting tomorrow.   Prior to admit did not seem to tolerate AF so well. I discussed with Dr. Titus Mould and we both agree that it is likely best to defer repeat attempt at [facility] right now. Will keep her on amio, diltiazem and apixaban and can readdress DC-CV in several weeks after she has fully recovered from her aspiration PNA. I called her daughter Kenney Houseman and discussed this with her.  Please d/c home on   demadex 40 bid  kcl 20 bid  amio 200 bid  lopressor 50 bid  apixaban 5 bid Diltiazem 240  Clonidine  atorva  Stop amlodipine  F/u in HF clinic 7/15 945a in H&V center  Hanaa Payes,MD 9:30 AM

## 2014-03-11 NOTE — Progress Notes (Signed)
Per Case Manager, patient needs EMS transfer home. CSW spoke to patient and confirmed address. CSW set up EMS transportation and is signing off at this time.  Jeanette Caprice, MSW, [place]

## 2014-03-11 NOTE — Progress Notes (Signed)
Pt doesn't require continuous oxygen. rma sats are wnl. Changes made to [facility] transport form. Reason for transport is risk of injury d/t stairs to apartment. Spoke with Pamella Pert, made her aware. Informed PTARR.

## 2014-03-11 NOTE — Progress Notes (Signed)
PULMONARY / CRITICAL CARE MEDICINE   Name: [name] MRN: [id number] DOB: 1945/10/14    ADMISSION DATE:  03/03/2014 CONSULTATION DATE:  7/02  REFERRING MD :  Fanny Bien PRIMARY SERVICE: [facility]--> PCCM  CHIEF COMPLAINT:  Shortness of breath and chest pain   BRIEF PATIENT DESCRIPTION: 68 y.o. female with recently diagnosed with A-fib/flutter (apixaban) who presented to [facility] 6/30 with chest pain and shortness of breath after a scheduled cardioversion. Being treated for possible aspiration pneumonia vs pulm edema. 7/02 PCCM consulted for worsening shortness of breath and increasing O2 requirements.   SIGNIFICANT EVENTS / STUDIES:  6/30 admitted with SOB, CP after scheduled cardioversion - CXR positive for possible aspiration PNA vs pulm edema 7/02 PCCM consulted for increased work of breathing and increasing oxygen requirements.  7/5- off daytime BIPAP, on 2 liters O2  LINES / TUBES: PIVs Central line 7/02 >>>7/5  CULTURES: Blood cultures 7/02 >>>neg  ANTIBIOTICS: Cefepime 7/1 >>> 7/2 Vancomycin 7/1 >>>7/4 Zosyn 7/2 >>>7/7  SUBJECTIVE:  No distress, appears well  VITAL SIGNS: Temp:  [97.8 F (36.6 C)-98.4 F (36.9 C)] 98.2 F (36.8 C) (07/08 0430) Pulse Rate:  [89-110] 98 (07/08 0430) Resp:  [18-20] 20 (07/08 0430) BP: (115-134)/(57-83) 130/73 mmHg (07/08 0430) SpO2:  [92 %-100 %] 98 % (07/08 0722) Weight:  [233 lb (105.688 kg)] 233 lb (105.688 kg) (07/08 0430) HEMODYNAMICS: cv stable , back in AF   VENTILATOR SETTINGS:   INTAKE / OUTPUT: Intake/Output     07/07 0701 - 07/08 0700 07/08 0701 - 07/09 0700   P.O. 480    I.V. (mL/kg)     IV Piggyback     Total Intake(mL/kg) 480 (4.5)    Urine (mL/kg/hr) 400 (0.2)    Total Output 400     Net +80           PHYSICAL EXAMINATION: General: no acute distress sitting OOB in chair  Neuro: A/Ox4, appropriate  HEENT: PERRL, on Nasal cann O2 Cardiovascular: s1 s2 irr Lungs:resps even non labored on [place], few  scattered rhonchi L>R  Abdomen: Rotund, non-tender, no r/g Musculoskeletal: no sig edema Skin: intact  LABS:  CBC  Recent Labs Lab 03/09/14 0312 03/10/14 1036 03/11/14 0355  WBC 10.6* 8.1 9.7  HGB 8.6* 8.7* 8.8*  HCT 26.9* 27.5* 27.4*  PLT 324 380 410*   Coag's  Recent Labs Lab 03/05/14 2330 03/06/14 0450 03/07/14 0400  APTT  --  48*  --   INR 2.01* 2.12* 2.06*   BMET  Recent Labs Lab 03/09/14 0312 03/10/14 1036 03/11/14 0355  NA 141 143 142  K 3.5* 3.6* 3.9  CL 103 104 104  CO2 26 26 24   BUN 20 18 17   CREATININE 2.57* 2.28* 2.34*  GLUCOSE 73 202* 170*   Electrolytes  Recent Labs Lab 03/05/14 1710  03/09/14 0312 03/10/14 1036 03/11/14 0355  CALCIUM  --   < > 9.0 9.0 9.3  MG 2.1  --   --   --   --   PHOS 1.5*  --   --   --   --   < > = values in this interval not displayed. Sepsis Markers  Recent Labs Lab 03/05/14 0251 03/05/14 1710  LATICACIDVEN  --  1.6  PROCALCITON 0.11  --    ABG  Recent Labs Lab 03/05/14 1427  PHART 7.468*  PCO2ART 33.2*  PO2ART 56.0*   Liver Enzymes No results found for this basename: AST, ALT, ALKPHOS, BILITOT, ALBUMIN,  in the last 168 hours Cardiac Enzymes  Recent Labs Lab 03/04/14 1040  TROPONINI <0.30   Glucose  Recent Labs Lab 03/10/14 0720 03/10/14 1210 03/10/14 1831 03/10/14 2029 03/10/14 2203 03/11/14 0612  GLUCAP 242* 278* 122* 68* 130* 190*    Imaging No results found.  ASSESSMENT / PLAN: Principal Problem:   Acute respiratory failure Active Problems:   Obstructive sleep apnea   Chronic kidney disease (CKD), stage IV (severe)   DM (diabetes mellitus), type 2 with renal complications   Chest pain   Atrial fibrillation with RVR   Aspiration pneumonia   Acute diastolic heart failure   Acute pulmonary edema   Coagulopathy   Acute blood loss anemia   PULMONARY A: Acute respiratory failure d/t aspiration pna (after cardioversion) / pulm edema  H/o sleep apnea P:    Cont  diuresis - now with home torsemide  xopenex in setting of AF Ambulatory desat  Flutter valve qhs CPAP as outpt    CARDIOVASCULAR A:  Atrial fibrillation  Hypertension H/o diastolic heart failure Echo : EF 560-65%  LV stiff.   P:  Per Cards  Amiodarone, diltiazem, xarelto  No plans for reattempt cardioversion at this time - cards may consider in a few weeks once PNA resolved  Low dose metoprolol  Torsemide  outpt cards f/u with [facility]    RENAL A:   Acute on chronic renal failure - difficult to establish baseline Scr (1.96-2.95) Acute renal failure, atn likely, r/o from prior diuresis P:   Changed to home torsemide per cards  Renal following  outpt f/u    GASTROINTESTINAL A:   H/o GERD (barretts esophagus) - questionable contribution to aspiration? IBS P:    PPI  Diet   HEMATOLOGIC A: Bleeding from R IJ CVL-resolved fib Acute blood loss anemia  P:  Resumed apixaban 7/3   INFECTIOUS A:   aspiration pneumonia  P:   S/p course zosyn (listed PCN allergy is nausea only)   ENDOCRINE A:   Diabetes 2 with renal complication P:   SSI  NEUROLOGIC A:   H/o Depression H/o insomina Chronic pain? P:   Continued to Hold home amitriptyline Prn ambien Morphine PRN     Much improved and likely near ready for d/c.  Check ambulatory desat this am.  Will f/u later for possible d/c home.   Nickolas Madrid, NP 03/11/2014  9:06 AM Pager: [phone] or [phone]  *Care during the described time interval was provided by me and/or other providers on the critical care team. I have reviewed this patient's available data, including medical history, events of note, physical examination and test results as part of my evaluation.    Attending:  I have seen and examined the patient with nurse practitioner/resident and agree with the note above.   Ready for discharge from my standpoint  Roselie Awkward, MD [place] Pager: [phone] Cell:  [phone] If no response, call [phone]

## 2014-03-11 NOTE — Progress Notes (Addendum)
[facility] PCCM PROGRESS NOTE  S: Called to bedside by attending MD to assess patient for SOB. She was prepared for discharge and taken from unit by EMS when suddenly she developed SOB and mild cough. She felt as though she could not catch her breath and was brought back to her room. Denies fevers, productive cough. States that this is how these episodes always begin, suddenly with activity.   O: BP 126/82  Pulse 100  Temp(Src) 98.3 F (36.8 C) (Oral)  Resp 18  Ht 5\' 4"  (1.626 m)  Wt 105.688 kg (233 lb)  BMI 39.97 kg/m2  SpO2 96%  General:  Overweight female in mild respiratory distress Neuro:  Alert, oriented x 3 HEENT:  [place]/AT, PERRL Neck:  Supple, no JVD noted Cardiovascular:  Tachy, irreg irreg.  Lungs:  Respirations even, mildly increased WOB. Rhonchi and expiratory wheeze bilaterally. Abdomen:  Soft, non-tender, non-distended Musculoskeletal:  ROM intact, no acute deformity Skin:  Intact, MMM   A/P: Pulmonary Edema Hypoxia Atrial fibrillation  - Supplemental O2 as needed to maintain SpO2 90-95% - STAT CXR - Lasix 40mg  IV once - If does not improve may need to restart BiPAP - PRN xopenex - Will continue to monitor.    >>1845 - CXR shows improving bilateral infiltrates compared to 7/6 film. Work of breathing appears improved and she appears comfortable on 2L [place]. SpO2 100%. Lung sounds improved rhonchi. Wheeze gone. Afib 70's on monitor. Lasix and supplemental O2 seem to have had a positive impact on her. Based on her renal function, I will defer decision for scheduled diuresis to rounding team.    Georgann Housekeeper, ACNP [facility] Pulmonology/Critical Care Pager [phone] or [phone]

## 2014-03-11 NOTE — Progress Notes (Signed)
Pt. Was placed on CPAP of 7cm H2O via FFM (what pt. Wears at home) with 2L O2 bled in. Pt. Is tolerating CPAP well at this time without any complications.

## 2014-03-11 NOTE — Progress Notes (Signed)
S: No complaints today. SOB only with ambulation. Feels better. Tolerating oral diet.  O:BP 130/73  Pulse 98  Temp(Src) 98.2 F (36.8 C) (Oral)  Resp 20  Ht 5\' 4"  (1.626 m)  Wt 233 lb (105.688 kg)  BMI 39.97 kg/m2  SpO2 92%  Intake/Output Summary (Last 24 hours) at 03/11/14 0835 Last data filed at 03/10/14 2139  Gross per 24 hour  Intake    480 ml  Output    400 ml  Net     80 ml   Intake/Output: I/O last 3 completed shifts: In: 749.8 [P.O.:600; I.V.:49.8; IV Piggyback:100] Out: 600 [Urine:600]  Intake/Output this shift:    Weight change: -1 lb 2.1 oz (-0.512 kg)  Physical Exam-  Vitals noted. Gen: NAD HEENT: [place]/AT, mildly dry oral mucosa. [id number], no murmurs appreciated. Resp: Clear to auscultation Abd: Full, soft, bowel sounds heard Ext: No pedal edema, warm and well perfused, ankle pain- right, not new, pt has a hx of gout, left and right metatarsophalangeal jt invlovement- 1st toe. Neuro: Alert and oriented, moving all extremities.  Recent Labs Lab 03/05/14 0251 03/05/14 1710 03/06/14 0410 03/07/14 0400 03/08/14 0300 03/09/14 0312 03/10/14 1036 03/11/14 0355  NA 139  --  139 142 139 141 143 142  K 3.7  --  4.0 3.4* 3.5* 3.5* 3.6* 3.9  CL 99  --  98 104 99 103 104 104  CO2 22  --  25 24 26 26 26 24   GLUCOSE 262*  --  241* 163* 163* 73 202* 170*  BUN 21  --  21 21 20 20 18 17   CREATININE 2.38*  --  2.43* 2.43* 2.58* 2.57* 2.28* 2.34*  CALCIUM 9.1  --  8.9 8.0* 8.5 9.0 9.0 9.3  PHOS  --  1.5*  --   --   --   --   --   --    CBC:  Recent Labs Lab 03/07/14 0400 03/08/14 0300 03/09/14 0312 03/10/14 1036 03/11/14 0355  WBC 11.4* 10.7* 10.6* 8.1 9.7  NEUTROABS 8.1* 6.4 6.1  --   --   HGB 9.8* 9.1* 8.6* 8.7* 8.8*  HCT 30.6* 27.4* 26.9* 27.5* 27.4*  MCV 91.9 91.6 91.2 91.4 91.9  PLT 260 267 324 380 410*   Cardiac Enzymes:  Recent Labs Lab 03/04/14 1040  TROPONINI <0.30   CBG  Recent Labs Lab 03/10/14 1210 03/10/14 1831  03/10/14 2029 03/10/14 2203 03/11/14 0612  GLUCAP 278* 122* 68* 130* 190*   Studies/Results: No results found. Marland Kitchen amiodarone  200 mg Oral BID  . amitriptyline  25 mg Oral QHS  . antiseptic oral rinse  15 mL Mouth Rinse BID  . apixaban  5 mg Oral BID  . atorvastatin  20 mg Oral q1800  . cloNIDine  0.1 mg Oral 3 times per day  . diltiazem  240 mg Oral Daily  . febuxostat  40 mg Oral Daily  . feeding supplement (ENSURE COMPLETE)  237 mL Oral BID BM  . insulin aspart  0-9 Units Subcutaneous TID WC  . insulin aspart  20 Units Subcutaneous TID PC  . insulin glargine  45 Units Subcutaneous Daily  . levalbuterol  0.63 mg Nebulization 4 times per day  . Linaclotide  290 mcg Oral Daily  . metoprolol  50 mg Oral BID  . pantoprazole  40 mg Oral Q1200  . sodium chloride  3 mL Intravenous Q12H    BMET    Component Value Date/Time   NA 142 03/11/2014  0355   K 3.9 03/11/2014 0355   CL 104 03/11/2014 0355   CO2 24 03/11/2014 0355   GLUCOSE 170* 03/11/2014 0355   BUN 17 03/11/2014 0355   CREATININE 2.34* 03/11/2014 0355   CALCIUM 9.3 03/11/2014 0355   GFRNONAA 20* 03/11/2014 0355   GFRAA 24* 03/11/2014 0355   CBC    Component Value Date/Time   WBC 9.7 03/11/2014 0355   RBC 2.98* 03/11/2014 0355   HGB 8.8* 03/11/2014 0355   HCT 27.4* 03/11/2014 0355   PLT 410* 03/11/2014 0355   MCV 91.9 03/11/2014 0355   MCH 29.5 03/11/2014 0355   MCHC 32.1 03/11/2014 0355   RDW 15.2 03/11/2014 0355   LYMPHSABS 2.2 03/09/2014 0312   MONOABS 1.2* 03/09/2014 0312   EOSABS 1.0* 03/09/2014 0312   BASOSABS 0.0 03/09/2014 0312     Assessment/Plan:  1. Acute respiratory failure- likely aspiration PNA after Cardioversion, on Zosyn. Resumed home torsemide 40 BID. Weight- 233, stable.   2. CKD4- Cr 2.34 today, stable, baseline hard to tell, Cr over the past  5 months- 1.96- 2.95. Started home torsemide yesterday. Likely sign off today. As Cr appears to be at baseline, with improvement in clinical status.  3. DM- Per primary team.   4. HTN-  Blood pressure WNL, management per primary  5. Atria fib- likely driven by PNA.  Management per cards. On Amiodarone and diltiazem, and metoprolol- 50mg  BID.  6. Anemia- HgB stable 8.8 today.  Likely from bleeding from previous central line site.  On apixaban, .  Denton Brick, [place],MD PGY-2, IMTS 03/11/2014 8:35 AM

## 2014-03-11 NOTE — Progress Notes (Signed)
I have personally seen and examined this patient and agree with the assessment/plan as outlined above by Denton Brick MD (PGY2). Continues to do better and appears close to renal baseline. Still with mild hypervolemia- anticipated to improve on torsemide. Will s/o for now- pls call if questions arise. She has a f/u at [place] with Dr.Powell on 04/21/14 at 3pm (Put into DC instructions)   Debra No K.,MD 03/11/2014 10:04 AM

## 2014-03-11 NOTE — Progress Notes (Signed)
2L of O2 has been reapplied d/t SOB reported by patient. Pt appears out of breath and has labored breathing. Pt is sitting in room chair watching TV. Will continue to monitor.

## 2014-03-11 NOTE — Progress Notes (Signed)
Pt ambulated in hallway with NT. Pt walked about 124ft without the use of O2  And then she became SOB. Pt was able to walk back to room without any O2 and was placed back on 2L once seated in her room. Pt has tolerated Room Air for about 83mins and says she also went without additional O2 the entire morning. Pt and NT report that upon exertion, Pt becomes SOB and breathes heavily. Pt on Room Air at the moment and is tolerating it well and SATs remain above 90%. Will continue to monitor.

## 2014-03-12 DIAGNOSIS — R29818 Other symptoms and signs involving the nervous system: Secondary | ICD-10-CM

## 2014-03-12 LAB — CBC
HCT: 28.4 % — ABNORMAL LOW (ref 36.0–46.0)
Hemoglobin: 8.8 g/dL — ABNORMAL LOW (ref 12.0–15.0)
MCH: 28.5 pg (ref 26.0–34.0)
MCHC: 31 g/dL (ref 30.0–36.0)
MCV: 91.9 fL (ref 78.0–100.0)
Platelets: 474 10*3/uL — ABNORMAL HIGH (ref 150–400)
RBC: 3.09 MIL/uL — ABNORMAL LOW (ref 3.87–5.11)
RDW: 15.5 % (ref 11.5–15.5)
WBC: 9.7 10*3/uL (ref 4.0–10.5)

## 2014-03-12 LAB — GLUCOSE, CAPILLARY
Glucose-Capillary: 136 mg/dL — ABNORMAL HIGH (ref 70–99)
Glucose-Capillary: 206 mg/dL — ABNORMAL HIGH (ref 70–99)
Glucose-Capillary: 208 mg/dL — ABNORMAL HIGH (ref 70–99)
Glucose-Capillary: 161 mg/dL — ABNORMAL HIGH (ref 70–99)

## 2014-03-12 LAB — BASIC METABOLIC PANEL
Anion gap: 14 (ref 5–15)
BUN: 18 mg/dL (ref 6–23)
CO2: 25 mEq/L (ref 19–32)
Calcium: 9.4 mg/dL (ref 8.4–10.5)
Chloride: 106 mEq/L (ref 96–112)
Creatinine, Ser: 2.26 mg/dL — ABNORMAL HIGH (ref 0.50–1.10)
GFR calc Af Amer: 25 mL/min — ABNORMAL LOW (ref >90)
GFR calc non Af Amer: 21 mL/min — ABNORMAL LOW (ref >90)
Glucose, Bld: 124 mg/dL — ABNORMAL HIGH (ref 70–99)
Potassium: 3.7 mEq/L (ref 3.7–5.3)
Sodium: 145 mEq/L (ref 137–147)

## 2014-03-12 MED ORDER — LEVALBUTEROL HCL 0.63 MG/3ML IN NEBU: 0.6300 mg | INHALATION_SOLUTION | Freq: Four times a day (QID) | RESPIRATORY_TRACT | Status: DC | PRN

## 2014-03-12 MED ORDER — TORSEMIDE 20 MG PO TABS
40.0000 mg | ORAL_TABLET | Freq: Two times a day (BID) | ORAL | Status: DC
  Administered 2014-03-12 – 2014-03-13 (×3): 40 mg via ORAL
  Filled 2014-03-12 (×5): qty 2

## 2014-03-12 NOTE — Progress Notes (Addendum)
Clinical Social Work Department CLINICAL SOCIAL WORK PLACEMENT NOTE 03/12/2014  Patient:  KENSLEIGH, SHAWVER  Account Number:  [id number] Admit date:  03/03/2014  Clinical Social Worker:  Megan Salon  Date/time:  03/12/2014 01:59 PM  Clinical Social Work is seeking post-discharge placement for this patient at the following level of care:   [place]   (*CSW will update this form in Epic as items are completed)   03/12/2014  Patient/family provided with [facility] Department of Clinical Social Work's list of facilities offering this level of care within the geographic area requested by the patient (or if unable, by the patient's family).  03/12/2014  Patient/family informed of their freedom to choose among providers that offer the needed level of care, that participate in Medicare, Medicaid or managed care program needed by the patient, have an available bed and are willing to accept the patient.  03/12/2014  Patient/family informed of [facility]' ownership interest in [facility], as well as of the fact that they are under no obligation to receive care at this facility.  PASARR submitted to EDS on  PASARR number received on   FL2 transmitted to all facilities in geographic area requested by pt/family on  03/12/2014 FL2 transmitted to all facilities within larger geographic area on   Patient informed that his/her managed care company has contracts with or will negotiate with  certain facilities, including the following:     Patient/family informed of bed offers received:  03/12/2014 Patient chooses bed at [facility] Physician recommends and patient chooses bed at    Patient to be transferred to  on  03/13/2014 Patient to be transferred to facility by EMS Patient and family notified of transfer on 03/13/2014 Name of family member notified:    The following physician request were entered in Epic:   Additional Comments: Patient has existing  [place], MSW, [place]

## 2014-03-12 NOTE — Progress Notes (Signed)
Physical Therapy Treatment Patient Details Name: [name] MRN: [id number] DOB: 1946-02-24 Today's Date: 03/12/2014    History of Present Illness Pt is a 68 y/o female admitted with acute respiratory failure. 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 Pt left the hospital via EMS and  was brought back due to SOB    PT Comments    Pt very limited this session due to SOB; she reports being barely able to make it to bathroom with nursing earlier; She was left with [name] in but O2 not hooked up to wall and sats 94% on RA, 92% with transfer; spoke with medical staff and per their report pt does not have any consistent help at  Home and has 2+ flights of stairs to get to her apt; given above, will recommend SNF at this time; PT will continue to follow  Follow Up Recommendations  SNF     Equipment Recommendations  None recommended by PT    Recommendations for Other Services       Precautions / Restrictions Precautions Precautions: Fall Restrictions Weight Bearing Restrictions: No    Mobility  Bed Mobility Overal bed mobility: Needs Assistance Bed Mobility: Supine to Sit     Supine to sit: Supervision     General bed mobility comments: increased time   Transfers Overall transfer level: Needs assistance Equipment used: None Transfers: Sit to/from Stand;Stand Pivot Transfers Sit to Stand: Min guard Stand pivot transfers: Min guard       General transfer comment: min-guard for initial balance; pt requires incr time to complete transition  Ambulation/Gait                 Stairs            Wheelchair Mobility    Modified Rankin (Stroke Patients Only)       Balance           Standing balance support: No upper extremity supported Standing balance-Leahy Scale: Fair                      Cognition Arousal/Alertness: Awake/alert Behavior During Therapy: WFL for tasks assessed/performed Overall Cognitive Status:  Within Functional Limits for tasks assessed                      Exercises  LAQs x 15 bil Ankle pumps x 15 bil Quad sets x 15 bil    General Comments        Pertinent Vitals/Pain 3-4/4 DOE Denies pain    Home Living                      Prior Function            PT Goals (current goals can now be found in the care plan section) Acute Rehab PT Goals PT Goal Formulation: With patient Time For Goal Achievement: 03/24/14 Potential to Achieve Goals: Good Progress towards PT goals: Not progressing toward goals - comment (due ot SOB)    Frequency  Min 3X/week    PT Plan Discharge plan needs to be updated    Co-evaluation             End of Session Equipment Utilized During Treatment: Gait belt Activity Tolerance: Patient limited by fatigue Patient left: in chair;with call bell/phone within reach     Time: 0922-0938 PT Time Calculation (min): 16 min  Charges:  $Therapeutic Activity: 8-22 mins  G CodesKenyon [name] 03/12/2014, 9:41 AM

## 2014-03-12 NOTE — H&P (Signed)
Per Tiffany Kocher, NP, ok to change nebs to prn.

## 2014-03-12 NOTE — Progress Notes (Signed)
PULMONARY / CRITICAL CARE MEDICINE   Name: [name] MRN: [id number] DOB: [birth year]    ADMISSION DATE:  03/03/2014 CONSULTATION DATE:  7/02  REFERRING MD :  Fanny Bien PRIMARY SERVICE: [facility]  CHIEF COMPLAINT:  Shortness of breath and chest pain   BRIEF PATIENT DESCRIPTION: 68 y.o. female with recently diagnosed with A-fib/flutter (apixaban) who [facility] with chest pain and shortness of breath after a scheduled cardioversion. Being treated for possible aspiration pneumonia vs pulm edema. 7/02 PCCM consulted for worsening shortness of breath and increasing O2 requirements.   SIGNIFICANT EVENTS / STUDIES:  6/30 admitted with SOB, CP after scheduled cardioversion - CXR positive for possible aspiration PNA vs pulm edema 7/02 PCCM consulted for increased work of breathing and increasing oxygen requirements.  7/5- off daytime BIPAP, on 2 liters O2 7/8- planned d/c, was en route to ambulance with EMS for d/c and became very SOB, hypoxic, brought back to room, d/c cancelled.   LINES / TUBES: PIVs Central line 7/02 >>>7/5  CULTURES: Blood cultures 7/02 >>>neg  ANTIBIOTICS: Cefepime 7/1 >>> 7/2 Vancomycin 7/1 >>>7/4 Zosyn 7/2 >>>7/7  SUBJECTIVE:  Had planned for d/c yesterday afternoon, however pt became very SOB, hypoxic per EMS when leaving the unit.  D/c cancelled and pt returned to room.  CXR improved.  Sats ok.   VITAL SIGNS: Temp:  [98 F (36.7 C)-98.4 F (36.9 C)] 98.4 F (36.9 C) (07/09 0555) Pulse Rate:  [79-120] 96 (07/09 0555) Resp:  [18-24] 24 (07/09 0555) BP: (126-149)/(67-89) 143/89 mmHg (07/09 0555) SpO2:  [92 %-100 %] 92 % (07/09 0555) Weight:  [233 lb 8 oz (105.915 kg)] 233 lb 8 oz (105.915 kg) (07/09 0430) VENTILATOR SETTINGS:   INTAKE / OUTPUT: Intake/Output     07/08 0701 - 07/09 0700 07/09 0701 - 07/10 0700   P.O. 720    Total Intake(mL/kg) 720 (6.8)    Urine (mL/kg/hr) 1625 (0.6)    Total Output 1625     Net -905       Urine Occurrence 2 x    Stool Occurrence 1 x     PHYSICAL EXAMINATION: General: no acute distress sitting OOB in chair  Neuro: A/Ox4, appropriate  HEENT: PERRL, on Nasal cann O2 Cardiovascular: s1 s2 irr Lungs:resps even non labored on Benedict, few scattered rhonchi L>R  Abdomen: Rotund, non-tender, no r/g Musculoskeletal: no sig edema Skin: intact  LABS:  CBC  Recent Labs Lab 03/10/14 1036 03/11/14 0355 03/12/14 0404  WBC 8.1 9.7 9.7  HGB 8.7* 8.8* 8.8*  HCT 27.5* 27.4* 28.4*  PLT 380 410* 474*   Coag's  Recent Labs Lab 03/05/14 2330 03/06/14 0450 03/07/14 0400  APTT  --  48*  --   INR 2.01* 2.12* 2.06*   BMET  Recent Labs Lab 03/10/14 1036 03/11/14 0355 03/12/14 0404  NA 143 142 145  K 3.6* 3.9 3.7  CL 104 104 106  CO2 26 24 25   BUN 18 17 18   CREATININE 2.28* 2.34* 2.26*  GLUCOSE 202* 170* 124*   Electrolytes  Recent Labs Lab 03/05/14 1710  03/10/14 1036 03/11/14 0355 03/12/14 0404  CALCIUM  --   < > 9.0 9.3 9.4  MG 2.1  --   --   --   --   PHOS 1.5*  --   --   --   --   < > = values in this interval not displayed. Sepsis Markers  Recent Labs Lab 03/05/14 1710  LATICACIDVEN  1.6   ABG  Recent Labs Lab 03/05/14 1427  PHART 7.468*  PCO2ART 33.2*  PO2ART 56.0*   Liver Enzymes No results found for this basename: AST, ALT, ALKPHOS, BILITOT, ALBUMIN,  in the last 168 hours Cardiac Enzymes No results found for this basename: TROPONINI, PROBNP,  in the last 168 hours Glucose  Recent Labs Lab 03/11/14 0612 03/11/14 1119 03/11/14 1736 03/11/14 2202 03/11/14 2358 03/12/14 0640  GLUCAP 190* 241* 129* 68* 115* 136*    Imaging Dg Chest Port 1 View  03/11/2014   CLINICAL DATA:  Left-sided chest pain and shortness of breath.  EXAM: PORTABLE CHEST - 1 VIEW  COMPARISON:  03/09/2014 and 03/08/2014 and 01/06/2014  FINDINGS: Pulmonary infiltrates in the left upper and lower lung zones have significantly improved. There is persistent  peribronchial thickening. There is a persistent area of hazy infiltrate in the right upper lobe adjacent to the major fissure. There is also persistent slight perihilar infiltrate on the right.  Heart size and vascularity are normal.  No appreciable effusions.  IMPRESSION: Improving of pulmonary infiltrates.   Electronically Signed   By: Rozetta Nunnery M.D.   On: 03/11/2014 18:31    ASSESSMENT / PLAN: Principal Problem:   Acute respiratory failure Active Problems:   Obstructive sleep apnea   Chronic kidney disease (CKD), stage IV (severe)   DM (diabetes mellitus), type 2 with renal complications   Chest pain   Atrial fibrillation with RVR   Aspiration pneumonia   Acute diastolic heart failure   Acute pulmonary edema   Coagulopathy   Acute blood loss anemia   PULMONARY A: Acute respiratory failure d/t aspiration pna (after cardioversion) / pulm edema  H/o sleep apnea P:    Cont diuresis - now with home torsemide  xopenex in setting of AF Ambulatory desat  Flutter valve qhs CPAP as outpt      CARDIOVASCULAR A:  Atrial fibrillation  Hypertension H/o diastolic heart failure Echo : EF 560-65%  LV stiff.   P:  Per Cards  Amiodarone, diltiazem, xarelto  No plans for reattempt cardioversion at this time - cards may consider in a few weeks once PNA resolved  Cont low dose metoprolol  Torsemide  Bensimhon to see again 7/9   RENAL A:   Acute on chronic renal failure - difficult to establish baseline Scr (1.96-2.95) Acute renal failure, atn likely, r/o from prior diuresis P:   Changed to home torsemide per cards  Renal following  outpt f/u    GASTROINTESTINAL A:   H/o GERD (barretts esophagus) - questionable contribution to aspiration? IBS P:    PPI  Diet   HEMATOLOGIC A: Bleeding from R IJ CVL-resolved fib Acute blood loss anemia  P:  Resumed apixaban 7/3   INFECTIOUS A:   aspiration pneumonia  P:   S/p course zosyn (listed PCN allergy is nausea only)    ENDOCRINE A:   Diabetes 2 with renal complication P:   SSI  NEUROLOGIC A:   H/o Depression H/o insomina Chronic pain? P:   Continued to Hold home amitriptyline Prn ambien Morphine PRN     Pt became very SOB, hypoxic when leaving unit for d/c 7/9. CXR actually improved. Remains net NEG.  Did not qualify for O2 on ambulatory desat 7/8.  Unclear what changed.  Suspect some component of anxiety as she was hesitant about going home (her daughter helps her but she essentially lives alone).  May need to discuss other dispositions (ie AL v SNF for  short term rehab).  Will cont diuresis, continue O2 for now.  Await further cards input and check ambulatory desat again later today.    Nickolas Madrid, NP 03/12/2014  8:23 AM Pager: [phone] or [phone]  *Care during the described time interval was provided by me and/or other providers on the critical care team. I have reviewed this patient's available data, including medical history, events of note, physical examination and test results as part of my evaluation.   Attending:  I have seen and examined the patient with nurse practitioner/resident and agree with the note above.   Lungs are clear today despite symptoms and CXR clearing up.  Suspect deconditioning is a large component of this.  Appreciate heart failure team's help.  Roselie Awkward, MD [facility] Pager: [phone] Cell: [phone] If no response, call [phone]

## 2014-03-12 NOTE — Progress Notes (Signed)
Pt. Was placed on CPAP of 7cm H2O via FFM (what pt. Wears at home) with 2L O2 bled in. Pt. Is tolerating CPAP well at this time without any complications.

## 2014-03-12 NOTE — Progress Notes (Addendum)
Per patient, patient has changed her mind and would like SNF. CSW will fax patient's information out and will provide bed offers once received. CSW will notify [facility] and will start insurance auth process. FL2 on chart for MD signature.  Jeanette Caprice, MSW, [place]

## 2014-03-12 NOTE — Progress Notes (Signed)
Clinical Social Work Department BRIEF PSYCHOSOCIAL ASSESSMENT 03/12/2014  Patient:  [name], [name]     Account Number:  [id number]     Admit date:  03/03/2014  Clinical Social Worker:  Megan Salon  Date/Time:  03/12/2014 11:23 AM  Referred by:  Physician  Date Referred:  03/12/2014 Referred for  SNF Placement   Other Referral:   Interview type:  Patient Other interview type:    PSYCHOSOCIAL DATA Living Status:  FAMILY Admitted from facility:   Level of care:   Primary support name:  Constance Haw Primary support relationship to patient:  CHILD, ADULT Degree of support available:   Good    CURRENT CONCERNS Current Concerns  Post-Acute Placement   Other Concerns:    SOCIAL WORK ASSESSMENT / PLAN CSW received referral for SNF placement at discharge. CSW went into patient's room and discussed SNF option with patient. Patient states she was open to rehab, but then thought about it more and would like to go home with family with her [facility] services. Patient states that her sister just came into town and is able to stay with her and her daughters for a month. Patient states she feels like going to a rehab would be worse for her. CSW encouraged patient to speak with her daughters about her discharge options. Patient called daughters on a 3way call and social work spoke with them regarding SNF placement. Patient's daughter are concerned why patient does not qualify for oxygen. CSW encouraged daughters to speak with the RN or MD about patient's medical concerns. CSW explained SNF process and daughters state that if patient is not going to qualify for oxygen, they just want to be able to bring patient back home and take care of her back home. Daughters stated that they can manage patient at home and were only interested in rehab if patient wanted to go. Patient continued to not want to go to SNF. CSW notified the PA who states she is going to speak with the patient regarding DC  plans.   Assessment/plan status:  Psychosocial Support/Ongoing Assessment of Needs Other assessment/ plan:   Information/referral to community resources:   SNF information    PATIENT'S/FAMILY'S RESPONSE TO PLAN OF CARE: Patient states she would rather go home.     Jeanette Caprice, MSW, [place]

## 2014-03-12 NOTE — Progress Notes (Signed)
Patient is currently active with [facility] for chronic disease management services.  Patient has been engaged by a [employer]. Our [facility] RN has spoken with the patient's daughters and has encouraged them to reconsider SNF short term rehabilitation with the patient.  Currently [facility] supports SNF placement but will follow up if patient declines.  Requested that an FL2 is placed on the EPIC chart in the event a direct to SNF admission is needed.  [facility] will assist with this transition.  Patient will receive a post discharge transition of care call and will be evaluated for monthly home visits for assessments and disease process education.  Made Inpatient Case Manager aware that [facility] following. Of note, [facility] Care Management services does not replace or interfere with any services that are arranged by inpatient case management or social work.  For additional questions or referrals please contact Corliss Blacker BSN RN [facility] Liaison at [phone].

## 2014-03-13 LAB — CBC
HCT: 29.8 % — ABNORMAL LOW (ref 36.0–46.0)
Hemoglobin: 9.3 g/dL — ABNORMAL LOW (ref 12.0–15.0)
MCH: 28.7 pg (ref 26.0–34.0)
MCHC: 31.2 g/dL (ref 30.0–36.0)
MCV: 92 fL (ref 78.0–100.0)
Platelets: 499 10*3/uL — ABNORMAL HIGH (ref 150–400)
RBC: 3.24 MIL/uL — ABNORMAL LOW (ref 3.87–5.11)
RDW: 15.6 % — ABNORMAL HIGH (ref 11.5–15.5)
WBC: 8.9 10*3/uL (ref 4.0–10.5)

## 2014-03-13 LAB — BASIC METABOLIC PANEL
Anion gap: 15 (ref 5–15)
BUN: 16 mg/dL (ref 6–23)
CO2: 25 mEq/L (ref 19–32)
Calcium: 9.2 mg/dL (ref 8.4–10.5)
Chloride: 105 mEq/L (ref 96–112)
Creatinine, Ser: 2.1 mg/dL — ABNORMAL HIGH (ref 0.50–1.10)
GFR calc Af Amer: 27 mL/min — ABNORMAL LOW (ref >90)
GFR calc non Af Amer: 23 mL/min — ABNORMAL LOW (ref >90)
Glucose, Bld: 121 mg/dL — ABNORMAL HIGH (ref 70–99)
Potassium: 3.4 mEq/L — ABNORMAL LOW (ref 3.7–5.3)
Sodium: 145 mEq/L (ref 137–147)

## 2014-03-13 LAB — GLUCOSE, CAPILLARY
Glucose-Capillary: 128 mg/dL — ABNORMAL HIGH (ref 70–99)
Glucose-Capillary: 126 mg/dL — ABNORMAL HIGH (ref 70–99)

## 2014-03-13 NOTE — Progress Notes (Signed)
CSW spoke to patient and provided bed offers. Patient would like [facility]. CSW received insurance auth and [place] is coming today to complete paperwork. FL2 on chart for MD signature.  Jeanette Caprice, MSW, [place]

## 2014-03-13 NOTE — Progress Notes (Signed)
Clinical Social Worker facilitated patient discharge including contacting patient and facility to confirm patient discharge plans.  Clinical information faxed to facility and patient agreeable with plan.  CSW arranged ambulance transport via [facility] to EMS for 2pm.  RN to call report prior to discharge.  Clinical Social Worker will sign off for now as social work intervention is no longer needed. Please consult [place] again if new need arises.  Jeanette Caprice, MSW, [place]

## 2014-03-16 ENCOUNTER — Other Ambulatory Visit: Payer: Self-pay | Admitting: *Deleted

## 2014-03-16 ENCOUNTER — Non-Acute Institutional Stay (SKILLED_NURSING_FACILITY): Payer: Commercial Managed Care - HMO | Admitting: Adult Health

## 2014-03-16 ENCOUNTER — Encounter: Payer: Self-pay | Admitting: Adult Health

## 2014-03-16 DIAGNOSIS — E785 Hyperlipidemia, unspecified: Secondary | ICD-10-CM | POA: Insufficient documentation

## 2014-03-16 DIAGNOSIS — I151 Hypertension secondary to other renal disorders: Secondary | ICD-10-CM

## 2014-03-16 DIAGNOSIS — G4733 Obstructive sleep apnea (adult) (pediatric): Secondary | ICD-10-CM

## 2014-03-16 DIAGNOSIS — E1122 Type 2 diabetes mellitus with diabetic chronic kidney disease: Secondary | ICD-10-CM

## 2014-03-16 DIAGNOSIS — K589 Irritable bowel syndrome without diarrhea: Secondary | ICD-10-CM

## 2014-03-16 DIAGNOSIS — I4891 Unspecified atrial fibrillation: Secondary | ICD-10-CM

## 2014-03-16 DIAGNOSIS — N184 Chronic kidney disease, stage 4 (severe): Secondary | ICD-10-CM

## 2014-03-16 DIAGNOSIS — D62 Acute posthemorrhagic anemia: Secondary | ICD-10-CM

## 2014-03-16 DIAGNOSIS — N189 Chronic kidney disease, unspecified: Secondary | ICD-10-CM

## 2014-03-16 DIAGNOSIS — E1129 Type 2 diabetes mellitus with other diabetic kidney complication: Secondary | ICD-10-CM

## 2014-03-16 DIAGNOSIS — I150 Renovascular hypertension: Secondary | ICD-10-CM

## 2014-03-16 DIAGNOSIS — I5032 Chronic diastolic (congestive) heart failure: Secondary | ICD-10-CM

## 2014-03-16 DIAGNOSIS — N2889 Other specified disorders of kidney and ureter: Secondary | ICD-10-CM

## 2014-03-16 DIAGNOSIS — I10 Essential (primary) hypertension: Secondary | ICD-10-CM | POA: Insufficient documentation

## 2014-03-16 MED ORDER — OXYCODONE-ACETAMINOPHEN 5-325 MG PO TABS: ORAL_TABLET | ORAL | Status: DC

## 2014-03-16 NOTE — Progress Notes (Signed)
Patient ID: [name], female   DOB: 1946/03/25, 68 y.o.   MRN: [id number]              PROGRESS NOTE  DATE: 03/16/2014  FACILITY: Nursing Home Location: [facility]  LEVEL OF CARE: SNF (31)  Acute Visit  CHIEF COMPLAINT:  Follow-up Hospitalization  HISTORY OF PRESENT ILLNESS: This is a 68 year old female who has been admitted to [facility] on 03/13/14 from [facility] with Acute respiratory failure. She was treated with antibiotic for possible pneumonia VS pulmonary edema. She has been admitted for a short-term rehabilitation.  REASSESSMENT OF ONGOING PROBLEM(S):  HTN: Pt 's HTN remains stable.  Denies CP, sob, DOE, pedal edema, headaches, dizziness or visual disturbances.  No complications from the medications currently being used.  Last BP : 123/88  ANEMIA: The anemia has been stable. The patient denies fatigue, melena or hematochezia. No complications from the medications currently being used. 7/15 hgb 9.3  CHF:The patient does not relate significant weight changes, denies sob, DOE, orthopnea, PNDs, pedal edema, palpitations or chest pain.  CHF remains stable.  No complications form the medications being used.  CHRONIC KIDNEY DISEASE: The patient's chronic kidney disease remains stable.  Patient denies increasing lower extremity swelling or confusion. 7/15 BUN 18 and creatinine 2.26  ATRIAL FIBRILLATION: the patients atrial fibrillation remains stable.  The patient denies DOE, tachycardia, orthopnea, transient neurological sx, palpitations, & PNDs.  No complications noted from the medications currently being used.  HYPERLIPIDEMIA: No complications from the medications presently being used.   PAST MEDICAL HISTORY : Reviewed.  No changes/see problem list  CURRENT MEDICATIONS: Reviewed per MAR/see medication list  REVIEW OF SYSTEMS:  GENERAL: no change in appetite, no fatigue, no weight changes, no fever, chills or weakness RESPIRATORY: no cough,  SOB, DOE, wheezing, hemoptysis CARDIAC: no chest pain, or palpitations, +edema GI: no abdominal pain, diarrhea, constipation, heart burn, nausea or vomiting  PHYSICAL EXAMINATION  GENERAL: no acute distress, normal body habitus EYES: conjunctivae normal, sclerae normal, normal eye lids NECK: supple, trachea midline, no neck masses, no thyroid tenderness, no thyromegaly LYMPHATICS: no LAN in the neck, no supraclavicular LAN RESPIRATORY: breathing is even & unlabored, BS CTAB CARDIAC: irregularly irregular, no murmur,no extra heart sounds, BLE edema 2+ GI: abdomen soft, normal BS, no masses, no tenderness, no hepatomegaly, no splenomegaly EXTREMITIES:  Able to move all 4 extremities PSYCHIATRIC: the patient is alert & oriented to person, affect & behavior appropriate  LABS/RADIOLOGY: Labs reviewed: Basic Metabolic Panel:  Recent Labs  03/05/14 1710  03/11/14 0355 03/12/14 0404 03/13/14 0641  NA  --   < > 142 145 145  K  --   < > 3.9 3.7 3.4*  CL  --   < > 104 106 105  CO2  --   < > 24 25 25   GLUCOSE  --   < > 170* 124* 121*  BUN  --   < > 17 18 16   CREATININE  --   < > 2.34* 2.26* 2.10*  CALCIUM  --   < > 9.3 9.4 9.2  MG 2.1  --   --   --   --   PHOS 1.5*  --   --   --   --   < > = values in this interval not displayed. Liver Function Tests:  Recent Labs  12/09/13 1330 12/15/13 1036 03/04/14 0338  AST 23 20 14   ALT 14 12 7   ALKPHOS  66 60 87  BILITOT 0.3 0.4 0.5  PROT 7.5 7.4 6.7  ALBUMIN 3.7 3.7 2.9*   CBC:  Recent Labs  03/07/14 0400 03/08/14 0300 03/09/14 0312  03/11/14 0355 03/12/14 0404 03/13/14 0641  WBC 11.4* 10.7* 10.6*  < > 9.7 9.7 8.9  NEUTROABS 8.1* 6.4 6.1  --   --   --   --   HGB 9.8* 9.1* 8.6*  < > 8.8* 8.8* 9.3*  HCT 30.6* 27.4* 26.9*  < > 27.4* 28.4* 29.8*  MCV 91.9 91.6 91.2  < > 91.9 91.9 92.0  PLT 260 267 324  < > 410* 474* 499*  < > = values in this interval not displayed.  Cardiac Enzymes:  Recent Labs  03/03/14 2255  03/04/14 0338 03/04/14 1040  TROPONINI <0.30 <0.30 <0.30    CBG:  Recent Labs  03/12/14 2118 03/13/14 0611 03/13/14 1158  GLUCAP 161* 128* 126*   EXAM: PORTABLE CHEST - 1 VIEW   COMPARISON:  03/09/2014 and 03/08/2014 and 01/06/2014   FINDINGS: Pulmonary infiltrates in the left upper and lower lung zones have significantly improved. There is persistent peribronchial thickening. There is a persistent area of hazy infiltrate in the right upper lobe adjacent to the major fissure. There is also persistent slight perihilar infiltrate on the right.   Heart size and vascularity are normal.  No appreciable effusions.   IMPRESSION: Improving of pulmonary infiltrates.   ASSESSMENT/PLAN:  Atrial fibrillation with RVR status post failed cardioversion - rate controlled; continue amiodarone, diltiazem, Lopressor and Eliquis; follow-up with cardiologist Chronic diastolic heart failure - stable; continue Demadex; weight 3 times/week Diabetes mellitus, type II with renal complications - continue Lantus and Humalog Anemia, acute blood loss - check CBC Hypertension - well controlled; continue Catapres and Lopressor IBS -  Continue Linzess and Bentyl PRN Hyperlipidemia - continue Crestor OSA - CPAP @ night CKD, stage IV - check BMP; followup with renal outpatient   Total patient time: Greater than 30 minutes   CPT CODE: 84166  Monina Vargas- NP [facility] [phone]

## 2014-03-16 NOTE — Telephone Encounter (Signed)
[facility] 

## 2014-03-17 ENCOUNTER — Encounter (HOSPITAL_COMMUNITY): Payer: Self-pay

## 2014-03-17 ENCOUNTER — Non-Acute Institutional Stay (SKILLED_NURSING_FACILITY): Payer: Commercial Managed Care - HMO | Admitting: Internal Medicine

## 2014-03-17 DIAGNOSIS — E1129 Type 2 diabetes mellitus with other diabetic kidney complication: Secondary | ICD-10-CM

## 2014-03-17 DIAGNOSIS — E1122 Type 2 diabetes mellitus with diabetic chronic kidney disease: Secondary | ICD-10-CM

## 2014-03-17 DIAGNOSIS — I4891 Unspecified atrial fibrillation: Secondary | ICD-10-CM

## 2014-03-17 DIAGNOSIS — N184 Chronic kidney disease, stage 4 (severe): Secondary | ICD-10-CM

## 2014-03-17 DIAGNOSIS — I5032 Chronic diastolic (congestive) heart failure: Secondary | ICD-10-CM

## 2014-03-17 DIAGNOSIS — N189 Chronic kidney disease, unspecified: Secondary | ICD-10-CM

## 2014-03-17 DIAGNOSIS — I482 Chronic atrial fibrillation, unspecified: Secondary | ICD-10-CM

## 2014-03-17 NOTE — Progress Notes (Signed)
Patient ID: [name], female   DOB: [birth year], 68 y.o.   MRN: [id number]  Primary Cardiologist: Dr. Einar Gip PCP: Dr. [name] Nephrologist: Dr. Florene Glen  HPI: [name] is a 68 year old woman with hypertension, hyperlipidemia, bronchial asthma, diabetes mellitus, CKD stage IV (baseline cr ~2.5), atrial fibrillation and diastolic HF.  She was admitted in 4/15 for R TKR. That hospitalization complicated by a/c diastolic HF with respiratory distress requiring non-rebreather support. Weight on discharge was 226 pounds. ABG at that time 7.4/34/91/97%. Cr peaked at 2.9  Admitted 6/30-7/10/15 for SOB and CP following scheduled cardioversion. She maintained SR for short period of time and then went back into Afib/Aflutter. She was treated with abx for possible aspiration PNA and HR controlled on amio and diltiazem. Discharged to [facility] at a weight of 232 lbs.   [facility] Follow up for Heart Failure: Still SOB about the same as when she left the hospital. Wearing O2 2L 24 hours. Participating in PT and able to walk about 50 ft before getting very SOB. Denies CP or PND. + orthopnea and lower extremity edema. Denies palpitations. Denies fevers/chills. Weight at facility 233 lbs. Following a low salt diet and drinking less than 2L a day.   ROS: All systems negative except as listed in HPI, PMH and Problem List.  SH:  History   Social History  . Marital Status: Widowed    Spouse Name: N/A    Number of Children: 3  . Years of Education: N/A   Occupational History  . Not on file.   Social History Main Topics  . Smoking status: Never Smoker   . Smokeless tobacco: Never Used  . Alcohol Use: No  . Drug Use: No  . Sexual Activity: Not Currently   Other Topics Concern  . Not on file   Social History Narrative  . No narrative on file    FH:  Family History  Problem Relation Age of Onset  . Heart disease Mother   . Cancer Mother     bladder  . Kidney disease Mother   .  Ovarian cancer Daughter   . Stomach cancer Maternal Uncle   . Colon cancer Maternal Aunt   . Esophageal cancer Neg Hx     Past Medical History  Diagnosis Date  . Atrial flutter   . Hypertension   . Diabetes mellitus   . Diastolic heart failure     a. EF 60-65%, RV nl (03/2014)  . Asthma   . Hyperlipidemia   . Fatty liver   . Esophageal dysmotility   . Arthritis   . Sleep apnea     wears CPAP  . Fatty tumor fatty tumor back  . Coronary atherosclerosis of native coronary artery   . Morbid obesity   . Myocardial infarction 2009  . Dysrhythmia     afib,flutter hx  . Heart murmur   . Peripheral vascular disease   . Pneumonia     hx  . GERD (gastroesophageal reflux disease)     barrets esophagus  . Anginal pain     occ; non-ischemic Lexiscan 09/2012  . Kidney disease     CKD stage IV (Dr. Erling Cruz)    Current Outpatient Prescriptions  Medication Sig Dispense Refill  . acetaminophen (TYLENOL) 500 MG tablet Take 500 mg by mouth every 6 (six) hours as needed for mild pain.      Marland Kitchen amiodarone (PACERONE) 200 MG tablet Take 1 tablet (200 mg total) by mouth 2 (two)  times daily.  60 tablet  0  . amitriptyline (ELAVIL) 25 MG tablet Take 25 mg by mouth at bedtime.        Marland Kitchen apixaban (ELIQUIS) 5 MG TABS tablet Take 5 mg by mouth 2 (two) times daily.      . cloNIDine (CATAPRES) 0.1 MG tablet Take 3 tablets (0.3 mg total) by mouth 3 (three) times daily.  90 tablet  0  . dicyclomine (BENTYL) 10 MG capsule Take 10 mg by mouth 4 (four) times daily as needed (IBS).      Marland Kitchen diltiazem (CARDIZEM CD) 240 MG 24 hr capsule Take 1 capsule (240 mg total) by mouth daily.  30 capsule  0  . esomeprazole (NEXIUM) 40 MG capsule Take 40 mg by mouth daily at 12 noon.      . febuxostat (ULORIC) 40 MG tablet Take 40 mg by mouth daily.       . insulin glargine (LANTUS) 100 UNIT/ML injection Inject 0.4 mLs (40 Units total) into the skin daily.  10 mL  11  . insulin lispro (HUMALOG) 100 UNIT/ML injection  Inject 0.2 mLs (20 Units total) into the skin 3 (three) times daily after meals.  10 mL  11  . Linaclotide (LINZESS) 145 MCG CAPS capsule Take 290 mcg by mouth daily.      . methocarbamol (ROBAXIN-750) 750 MG tablet Take 1 tablet (750 mg total) by mouth every 8 (eight) hours as needed for muscle spasms.  40 tablet  0  . metoprolol (LOPRESSOR) 50 MG tablet Take 50 mg by mouth 2 (two) times daily.      . nitroGLYCERIN (NITROSTAT) 0.4 MG SL tablet Place 0.4 mg under the tongue every 5 (five) minutes as needed for chest pain.       Marland Kitchen oxyCODONE-acetaminophen (PERCOCET/ROXICET) 5-325 MG per tablet Take one tablet by mouth every 6 hours as needed for mild pain; Take two tablets by mouth every 6 hours as needed for moderate to severe pain  240 tablet  0  . potassium chloride SA (K-DUR,KLOR-CON) 20 MEQ tablet Take 1 tablet (20 mEq total) by mouth 2 (two) times daily.      . ranitidine (ZANTAC) 150 MG tablet Take 1 tablet (150 mg total) by mouth at bedtime.  30 tablet  11  . rosuvastatin (CRESTOR) 10 MG tablet Take 10 mg by mouth daily.        Marland Kitchen torsemide (DEMADEX) 20 MG tablet Take 40 mg by mouth 2 (two) times daily.      . Vitamin D, Ergocalciferol, (DRISDOL) 50000 UNITS CAPS capsule Take 50,000 Units by mouth every Monday.      Penne Lash HFA 45 MCG/ACT inhaler Inhale 2 puffs into the lungs every 4 (four) hours as needed for wheezing or shortness of breath.  1 Inhaler  1  . zolpidem (AMBIEN) 10 MG tablet Take 10 mg by mouth at bedtime as needed for sleep.        No current facility-administered medications for this encounter.    Filed Vitals:   03/18/14 0938  BP: 150/88  Pulse: 97  Resp: 20  Weight: 236 lb 8 oz (107.276 kg)  SpO2: 97%    PHYSICAL EXAM: General: Obese, NAD, in wheelchair.  HEENT: normal  Neck: supple. JVP hard to see d/t body habitus but appears 8; Carotids 2+ bilat; no bruits. No lymphadenopathy or thryomegaly appreciate Cor: PMI nonpalpbale. Distant. Irregular. No obvious  murmur  Lungs: Clear with long exp phase  Abdomen: Obese soft, nontender,.  No hepatosplenomegaly. No bruits or masses. Good bowel sounds.  Extremities: no cyanosis, clubbing, rash, warm. 1+ bilateral edema  Neuro: alert & orientedx3, cranial nerves grossly intact. moves all 4 extremities w/o difficulty. Affect pleasant     ASSESSMENT & PLAN:  1) Chronic diastolic HF: EF 123456 (0000000) - Reviewed discharge summary and patient recently admitted for ?aspiration PNA after schedule cardioversion. Did not maintain NSR and converted back to fib/flutter.  - NYHA III symptoms and volume status slightly elevated. Will continue torsemide 40 mg BID. Have asked for the SNF to weigh daily and to fax weekly weights. Have to be careful with renal function. - SBP elevated. Will increase lopressor to 75 mg BID. - Reinforced the need and importance of daily weights, a low sodium diet, and fluid restriction (less than 2 L a day). Instructed to call the HF clinic if weight increases more than 3 lbs overnight or 5 lbs in a week.  2) Afib/Aflutter - Rate controlled. Will continue diltiazem, amiodarone and BB. No bleeding issues, continue Eliquis 5 mg BID. Will follow up in 3 weeks to discuss setting up DC-CV.  3) CKD stage IV - Baseline Cr 2.1-2.4. Will have facility check on Monday and fax results. Follow up with nephrology.  4) HTN - Elevated. As above will increase lopressor to 75 mg BID.   F/U 3 weeks. [name] B NP-C 1:21 PM

## 2014-03-18 ENCOUNTER — Ambulatory Visit (HOSPITAL_COMMUNITY)
Admit: 2014-03-18 | Discharge: 2014-03-18 | Disposition: A | Discharge status: Home / Self Care | Payer: Medicare HMO | Source: Ambulatory Visit | Attending: Anesthesiology | Admitting: Anesthesiology

## 2014-03-18 ENCOUNTER — Encounter (HOSPITAL_COMMUNITY): Payer: Self-pay

## 2014-03-18 VITALS — BP 150/88 | HR 97 | Resp 20 | Wt 236.5 lb

## 2014-03-18 DIAGNOSIS — J45909 Unspecified asthma, uncomplicated: Secondary | ICD-10-CM | POA: Diagnosis not present

## 2014-03-18 DIAGNOSIS — I4891 Unspecified atrial fibrillation: Secondary | ICD-10-CM

## 2014-03-18 DIAGNOSIS — I5032 Chronic diastolic (congestive) heart failure: Secondary | ICD-10-CM | POA: Diagnosis not present

## 2014-03-18 DIAGNOSIS — K219 Gastro-esophageal reflux disease without esophagitis: Secondary | ICD-10-CM | POA: Diagnosis not present

## 2014-03-18 DIAGNOSIS — G4733 Obstructive sleep apnea (adult) (pediatric): Secondary | ICD-10-CM

## 2014-03-18 DIAGNOSIS — E785 Hyperlipidemia, unspecified: Secondary | ICD-10-CM | POA: Insufficient documentation

## 2014-03-18 DIAGNOSIS — I4892 Unspecified atrial flutter: Secondary | ICD-10-CM | POA: Insufficient documentation

## 2014-03-18 DIAGNOSIS — E119 Type 2 diabetes mellitus without complications: Secondary | ICD-10-CM | POA: Insufficient documentation

## 2014-03-18 DIAGNOSIS — I252 Old myocardial infarction: Secondary | ICD-10-CM | POA: Diagnosis not present

## 2014-03-18 DIAGNOSIS — Z8249 Family history of ischemic heart disease and other diseases of the circulatory system: Secondary | ICD-10-CM | POA: Insufficient documentation

## 2014-03-18 DIAGNOSIS — G4730 Sleep apnea, unspecified: Secondary | ICD-10-CM | POA: Insufficient documentation

## 2014-03-18 DIAGNOSIS — I5030 Unspecified diastolic (congestive) heart failure: Secondary | ICD-10-CM | POA: Diagnosis present

## 2014-03-18 DIAGNOSIS — I739 Peripheral vascular disease, unspecified: Secondary | ICD-10-CM | POA: Insufficient documentation

## 2014-03-18 DIAGNOSIS — Z79899 Other long term (current) drug therapy: Secondary | ICD-10-CM | POA: Insufficient documentation

## 2014-03-18 DIAGNOSIS — N184 Chronic kidney disease, stage 4 (severe): Secondary | ICD-10-CM | POA: Insufficient documentation

## 2014-03-18 DIAGNOSIS — I129 Hypertensive chronic kidney disease with stage 1 through stage 4 chronic kidney disease, or unspecified chronic kidney disease: Secondary | ICD-10-CM | POA: Insufficient documentation

## 2014-03-18 DIAGNOSIS — I2510 Atherosclerotic heart disease of native coronary artery without angina pectoris: Secondary | ICD-10-CM | POA: Insufficient documentation

## 2014-03-18 DIAGNOSIS — Z794 Long term (current) use of insulin: Secondary | ICD-10-CM | POA: Insufficient documentation

## 2014-03-18 DIAGNOSIS — I10 Essential (primary) hypertension: Secondary | ICD-10-CM

## 2014-03-18 MED ORDER — METOPROLOL TARTRATE 50 MG PO TABS: 75.0000 mg | ORAL_TABLET | Freq: Two times a day (BID) | ORAL | Status: DC

## 2014-03-18 NOTE — Progress Notes (Signed)
HISTORY & PHYSICAL  DATE: 03/17/2014   FACILITY: [facility]  LEVEL OF CARE: SNF (31)  ALLERGIES:  Allergies  Allergen Reactions  . Codeine Nausea And Vomiting  . Penicillins Nausea And Vomiting  . Sulfa Antibiotics Itching and Nausea And Vomiting    "everything I seen was red"  . Other Itching    Adhesive from ekg leads    CHIEF COMPLAINT:  Manage CHF, atrial fibrillation and diabetes mellitus  HISTORY OF PRESENT ILLNESS: 68 year old African American female was hospitalized secondary to acute respiratory failure. After hospitalization she is sensitive to this facility for short-term rehabilitation.  CHF:The patient does not relate significant weight changes, denies sob, DOE, orthopnea, PNDs, pedal edema, palpitations or chest pain.  CHF remains stable.  No complications form the medications being used.  ATRIAL FIBRILLATION: the patients atrial fibrillation remains stable.  The patient denies DOE, tachycardia, orthopnea, transient neurological sx, pedal edema, palpitations, & PNDs.  No complications noted from the medications currently being used.  DM:pt's DM remains stable.  Pt denies polyuria, polydipsia, polyphagia, changes in vision or hypoglycemic episodes.  No complications noted from the medication presently being used.  Last hemoglobin A1c is not available.  PAST MEDICAL HISTORY :  Past Medical History  Diagnosis Date  . Atrial flutter   . Hypertension   . Diabetes mellitus   . Diastolic heart failure     a. EF 60-65%, RV nl (03/2014)  . Asthma   . Hyperlipidemia   . Fatty liver   . Esophageal dysmotility   . Arthritis   . Sleep apnea     wears CPAP  . Fatty tumor fatty tumor back  . Coronary atherosclerosis of native coronary artery   . Morbid obesity   . Myocardial infarction 2009  . Dysrhythmia     afib,flutter hx  . Heart murmur   . Peripheral vascular disease   . Pneumonia     hx  . GERD (gastroesophageal reflux  disease)     barrets esophagus  . Anginal pain     occ; non-ischemic Lexiscan 09/2012  . Kidney disease     CKD stage IV (Dr. Erling Cruz)    PAST SURGICAL HISTORY: Past Surgical History  Procedure Laterality Date  . Coronary angioplasty with stent placement    . Breast lumpectomy      right  . Tubal ligation    . Tonsillectomy    . Total knee arthroplasty Right 12/15/2013    Procedure: RIGHT TOTAL KNEE ARTHROPLASTY;  Surgeon: Alta Corning, MD;  Location: [facility];  Service: Orthopedics;  Laterality: Right;  . Cardioversion N/A 03/03/2014    Procedure: CARDIOVERSION;  Surgeon: Laverda Page, MD;  Location: [facility];  Service: Cardiovascular;  Laterality: N/A;    SOCIAL HISTORY:  reports that she has never smoked. She has never used smokeless tobacco. She reports that she does not drink alcohol or use illicit drugs.  FAMILY HISTORY:  Family History  Problem Relation Age of Onset  . Heart disease Mother   . Cancer Mother     bladder  . Kidney disease Mother   . Ovarian cancer Daughter   . Stomach cancer Maternal Uncle   . Colon cancer Maternal Aunt   . Esophageal cancer Neg Hx     CURRENT MEDICATIONS: Reviewed per MAR/see medication list  REVIEW OF SYSTEMS:  See HPI otherwise 14 point ROS is negative.  PHYSICAL EXAMINATION  VS:  See VS  section  GENERAL: no acute distress, morbidly obese body habitus EYES: conjunctivae normal, sclerae normal, normal eye lids MOUTH/THROAT: lips without lesions,no lesions in the mouth,tongue is without lesions,uvula elevates in midline NECK: supple, trachea midline, no neck masses, no thyroid tenderness, no thyromegaly LYMPHATICS: no LAN in the neck, no supraclavicular LAN RESPIRATORY: breathing is even & unlabored, BS CTAB CARDIAC: Heart rate is irregularly irregular, no murmur,no extra heart sounds, no edema GI:  ABDOMEN: abdomen soft, normal BS, no masses, no tenderness  LIVER/SPLEEN: no hepatomegaly, no  splenomegaly MUSCULOSKELETAL: HEAD: normal to inspection  EXTREMITIES: LEFT UPPER EXTREMITY: full range of motion, normal strength & tone RIGHT UPPER EXTREMITY:  full range of motion, normal strength & tone LEFT LOWER EXTREMITY:  Moderate range of motion, normal strength & tone RIGHT LOWER EXTREMITY: Moderate range of motion, normal strength & tone PSYCHIATRIC: the patient is alert & oriented to person, affect & behavior appropriate  LABS/RADIOLOGY:  Labs reviewed: Basic Metabolic Panel:  Recent Labs  03/05/14 1710  03/11/14 0355 03/12/14 0404 03/13/14 0641  NA  --   < > 142 145 145  K  --   < > 3.9 3.7 3.4*  CL  --   < > 104 106 105  CO2  --   < > 24 25 25   GLUCOSE  --   < > 170* 124* 121*  BUN  --   < > 17 18 16   CREATININE  --   < > 2.34* 2.26* 2.10*  CALCIUM  --   < > 9.3 9.4 9.2  MG 2.1  --   --   --   --   PHOS 1.5*  --   --   --   --   < > = values in this interval not displayed. Liver Function Tests:  Recent Labs  12/09/13 1330 12/15/13 1036 03/04/14 0338  AST 23 20 14   ALT 14 12 7   ALKPHOS 66 60 87  BILITOT 0.3 0.4 0.5  PROT 7.5 7.4 6.7  ALBUMIN 3.7 3.7 2.9*   CBC:  Recent Labs  03/07/14 0400 03/08/14 0300 03/09/14 0312  03/11/14 0355 03/12/14 0404 03/13/14 0641  WBC 11.4* 10.7* 10.6*  < > 9.7 9.7 8.9  NEUTROABS 8.1* 6.4 6.1  --   --   --   --   HGB 9.8* 9.1* 8.6*  < > 8.8* 8.8* 9.3*  HCT 30.6* 27.4* 26.9*  < > 27.4* 28.4* 29.8*  MCV 91.9 91.6 91.2  < > 91.9 91.9 92.0  PLT 260 267 324  < > 410* 474* 499*  < > = values in this interval not displayed.  Cardiac Enzymes:  Recent Labs  03/03/14 2255 03/04/14 0338 03/04/14 1040  TROPONINI <0.30 <0.30 <0.30   CBG:  Recent Labs  03/12/14 2118 03/13/14 0611 03/13/14 1158  GLUCAP 161* 128* 126*    Transthoracic Echocardiography  Patient:    Debra, [name] MR #:       [id number] Study Date: 03/06/2014 Gender:     F Age:        28 Height:     162.6 cm Weight:     86.2 kg BSA:         2.01 m^2 Pt. Status: Room:       2H07C   ATTENDING    Raylene Miyamoto  ADMITTING    Gean Birchwood N  SONOGRAPHER  Mauricio Po, RDCS, CCT  PERFORMING   [facility], Inpatient  Nani Ravens,  Rahul P  cc:  ------------------------------------------------------------------- LV EF: 60% -   65%  ------------------------------------------------------------------- Indications:      Chest pain 786.51.  ------------------------------------------------------------------- History:   PMH:   Atrial fibrillation.  Risk factors:  Obstructive sleep apnea. Pneumonia. Chronic kidney disease. Diabetes mellitus.   ------------------------------------------------------------------- Study Conclusions  - Left ventricle: The cavity size was normal. Wall thickness was   increased in a pattern of mild LVH. Systolic function was normal.   The estimated ejection fraction was in the range of 60% to 65%.   Wall motion was normal; there were no regional wall motion   abnormalities. The study is not technically sufficient to allow   evaluation of LV diastolic function. - Aortic valve: Mildly calcified annulus. Trileaflet; mildly   calcified leaflets. There was no significant regurgitation. - Mitral valve: Calcified annulus. Mildly thickened leaflets .   There was trivial regurgitation. - Right atrium: Central venous pressure (est): 3 mm Hg. - Atrial septum: The septum was thickened. No defect or patent   foramen ovale was identified. - Tricuspid valve: There was trivial regurgitation. - Pulmonary arteries: Systolic pressure could not be accurately   estimated. - Pericardium, extracardiac: There was no pericardial effusion.  Impressions:  - Mild LVH with LVEF 60-65%, indeterminate diastolic function. MAC   with mildly thickened mitral leaflets, trivial mitral   regurgitation. Unable to assess PASP. Thickened interatrial   septum without obvious PFO.  Transthoracic echocardiography.   M-mode, complete 2D, spectral Doppler, and color Doppler.  Birthdate:  Patient birthdate: 09-23-1945.  Age:  Patient is 68 yr old.  Sex:  Gender: female. Height:  Height: 162.6 cm. Height: 64 in.  Weight:  Weight: 86.2 kg. Weight: 189.6 lb.  Body mass index:  BMI: 32.6 kg/m^2.  Body surface area:    BSA: 2.01 m^2.  Blood pressure:     162/83 Patient status:  Inpatient.  Study date:  Study date: 03/06/2014. Study time: 09:36 AM.  Location:  ICU/CCU  -------------------------------------------------------------------  ------------------------------------------------------------------- Left ventricle:  The cavity size was normal. Wall thickness was increased in a pattern of mild LVH. Systolic function was normal. The estimated ejection fraction was in the range of 60% to 65%. Wall motion was normal; there were no regional wall motion abnormalities. The study is not technically sufficient to allow evaluation of LV diastolic function.  ------------------------------------------------------------------- Aortic valve:   Mildly calcified annulus. Trileaflet; mildly calcified leaflets. Cusp separation was normal.  Doppler:  There was no significant regurgitation.  ------------------------------------------------------------------- Aorta:  Aortic root: The aortic root was normal in size.  ------------------------------------------------------------------- Mitral valve:   Calcified annulus. Mildly thickened leaflets . Doppler:  There was trivial regurgitation.  ------------------------------------------------------------------- Left atrium:  The atrium was normal in size.  ------------------------------------------------------------------- Atrial septum:  The septum was thickened. No defect or patent foramen ovale was identified.  ------------------------------------------------------------------- Right ventricle:  The cavity size was normal. Systolic function  was normal.  ------------------------------------------------------------------- Pulmonic valve:    The valve appears to be grossly normal. Doppler:  There was trivial regurgitation.  ------------------------------------------------------------------- Tricuspid valve:   The valve appears to be grossly normal. Doppler:  There was trivial regurgitation.  ------------------------------------------------------------------- Pulmonary artery:    Systolic pressure could not be accurately estimated.  ------------------------------------------------------------------- Right atrium:  The atrium was normal in size.  ------------------------------------------------------------------- Pericardium:  There was no pericardial effusion.  ------------------------------------------------------------------- Systemic veins: Inferior vena cava: The vessel was normal in size. The respirophasic diameter changes were in the normal range (>= 50%), consistent with normal central  venous pressure.  ------------------------------------------------------------------- Prepared and Electronically Authenticated by  Rozann Lesches, M.D. 2015-07-03T10:44:12  ------------------------------------------------------------------- Measurements   Left ventricle                   Value        12/18/2013 Reference  LV ID, ED, PLAX chordal  (L)     38.6  mm     46.1       43 - 52  LV ID, ES, PLAX chordal  (N)     30.6  mm     32.3       23 - 38  LV fx shortening, PLAX   (L)     21    %      30         >=29  chordal  LV PW thickness, ED              11.4  mm     10.2       ---------  IVS/LV PW ratio, ED      (N)     0.93         0.83       <=1.3    Ventricular septum               Value        12/18/2013 Reference  IVS thickness, ED                10.6  mm     8.42       ---------    Aorta                            Value        12/18/2013 Reference  Aortic root ID, ED               26    mm     26          ---------    Left atrium                      Value        12/18/2013 Reference  LA ID, A-P, ES                   42    mm     36         ---------  LA ID/bsa, A-P           (N)     2.09  cm/m^2 1.66       <=2.2  LA volume, S                     51.4  ml     ---------- ---------  LA volume/bsa, S                 25.6  ml/m^2 ---------- ---------    Systemic veins                   Value        12/18/2013 Reference  Estimated CVP                    3     mm Hg  3          ---------  Legend: (L)  and  (H)  mark values outside  specified reference range.  (N)  marks values inside specified reference range.      PORTABLE CHEST - 1 VIEW   COMPARISON:  03/09/2014 and 03/08/2014 and 01/06/2014   FINDINGS: Pulmonary infiltrates in the left upper and lower lung zones have significantly improved. There is persistent peribronchial thickening. There is a persistent area of hazy infiltrate in the right upper lobe adjacent to the major fissure. There is also persistent slight perihilar infiltrate on the right.   Heart size and vascularity are normal.  No appreciable effusions.   IMPRESSION: Improving of pulmonary infiltrates.    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 Chronic kidney disease stage IV-recheck Renovascular hypertension-well controlled Hypokalemia-continue supplementation.  Recheck GERD-continue Nexium Check CBC and BMP  I have reviewed patient's medical records received at admission/from hospitalization.  CPT CODE: 96295  Gayani Y Dasanayaka, [place] [phone]

## 2014-03-18 NOTE — Patient Instructions (Signed)
Doing great.  Continue to work with Physical Therapy.  Increase your lopressor to 75 mg twice a day.  Will get CBC and BMET next week and fax to clinic.  Follow up in 3 weeks.  Do the following things EVERYDAY: 1) Weigh yourself in the morning before breakfast. Write it down and keep it in a log. 2) Take your medicines as prescribed 3) Eat low salt foods-Limit salt (sodium) to 2000 mg per day.  4) Stay as active as you can everyday 5) Limit all fluids for the day to less than 2 liters 6)

## 2014-03-26 ENCOUNTER — Non-Acute Institutional Stay (SKILLED_NURSING_FACILITY): Payer: Commercial Managed Care - HMO | Admitting: Adult Health

## 2014-03-26 ENCOUNTER — Encounter: Payer: Self-pay | Admitting: Adult Health

## 2014-03-26 DIAGNOSIS — N184 Chronic kidney disease, stage 4 (severe): Secondary | ICD-10-CM

## 2014-03-26 DIAGNOSIS — B373 Candidiasis of vulva and vagina: Secondary | ICD-10-CM | POA: Insufficient documentation

## 2014-03-26 DIAGNOSIS — B3731 Acute candidiasis of vulva and vagina: Secondary | ICD-10-CM

## 2014-03-26 NOTE — Progress Notes (Signed)
Patient ID: [name], female   DOB: November 25, 1945, 68 y.o.   MRN: [id number]              PROGRESS NOTE  DATE:  03/26/14  FACILITY: Nursing Home Location: [facility]  LEVEL OF CARE: SNF (31)  Acute Visit  CHIEF COMPLAINT:  Manage Vaginal Yeast  HISTORY OF PRESENT ILLNESS: This is a 68 year old female who complained of whitish cottage cheese vaginal discharge and vaginal itching. She was recently treated with antibiotic for pneumonia.    PAST MEDICAL HISTORY : Reviewed.  No changes/see problem list  CURRENT MEDICATIONS: Reviewed per MAR/see medication list  REVIEW OF SYSTEMS:  GENERAL: no change in appetite, no fatigue, no weight changes, no fever, chills or weakness RESPIRATORY: no cough, SOB, DOE, wheezing, hemoptysis CARDIAC: no chest pain, or palpitations, +edema GI: no abdominal pain, diarrhea, constipation, heart burn, nausea or vomiting  PHYSICAL EXAMINATION  GENERAL: no acute distress, normal body habitus NECK: supple, trachea midline, no neck masses, no thyroid tenderness, no thyromegaly LYMPHATICS: no LAN in the neck, no supraclavicular LAN RESPIRATORY: breathing is even & unlabored, BS CTAB CARDIAC: irregularly irregular, no murmur,no extra heart sounds, BLE edema 2+ GI: abdomen soft, normal BS, no masses, no tenderness, no hepatomegaly, no splenomegaly EXTREMITIES:  Able to move all 4 extremities PSYCHIATRIC: the patient is alert & oriented to person, affect & behavior appropriate  LABS/RADIOLOGY: 03/25/14  WBC 6.8 hemoglobin 9.9 hematocrit 33.7 sodium 138 potassium 4.1 glucose 174 BUN 24 creatinine 2.3 calcium 8.9 03/23/14  sodium 140 potassium 2.6 glucose 192 BUN 17 creatinine 1.9 calcium 9.2 03/20/14  WBC 6.7 hemoglobin 9.7 hematocrit 32.9 sodium 140 potassium 3.8 glucose 118 BUN 22 creatinine 2.1 calcium 9.0 03/17/14  WBC 6.7 hemoglobin 9.7 hematocrit 32.9 sodium 140 potassium 2.8 glucose 118 BUN 22 creatinine 2.1 calcium 9.0 Labs  reviewed: Basic Metabolic Panel:  Recent Labs  03/05/14 1710  03/11/14 0355 03/12/14 0404 03/13/14 0641  NA  --   < > 142 145 145  K  --   < > 3.9 3.7 3.4*  CL  --   < > 104 106 105  CO2  --   < > 24 25 25   GLUCOSE  --   < > 170* 124* 121*  BUN  --   < > 17 18 16   CREATININE  --   < > 2.34* 2.26* 2.10*  CALCIUM  --   < > 9.3 9.4 9.2  MG 2.1  --   --   --   --   PHOS 1.5*  --   --   --   --   < > = values in this interval not displayed. Liver Function Tests:  Recent Labs  12/09/13 1330 12/15/13 1036 03/04/14 0338  AST 23 20 14   ALT 14 12 7   ALKPHOS 66 60 87  BILITOT 0.3 0.4 0.5  PROT 7.5 7.4 6.7  ALBUMIN 3.7 3.7 2.9*   CBC:  Recent Labs  03/07/14 0400 03/08/14 0300 03/09/14 0312  03/11/14 0355 03/12/14 0404 03/13/14 0641  WBC 11.4* 10.7* 10.6*  < > 9.7 9.7 8.9  NEUTROABS 8.1* 6.4 6.1  --   --   --   --   HGB 9.8* 9.1* 8.6*  < > 8.8* 8.8* 9.3*  HCT 30.6* 27.4* 26.9*  < > 27.4* 28.4* 29.8*  MCV 91.9 91.6 91.2  < > 91.9 91.9 92.0  PLT 260 267 324  < > 410* 474* 499*  < > =  values in this interval not displayed.  Cardiac Enzymes:  Recent Labs  03/03/14 2255 03/04/14 0338 03/04/14 1040  TROPONINI <0.30 <0.30 <0.30    CBG:  Recent Labs  03/12/14 2118 03/13/14 0611 03/13/14 1158  GLUCAP 161* 128* 126*   EXAM: PORTABLE CHEST - 1 VIEW   COMPARISON:  03/09/2014 and 03/08/2014 and 01/06/2014   FINDINGS: Pulmonary infiltrates in the left upper and lower lung zones have significantly improved. There is persistent peribronchial thickening. There is a persistent area of hazy infiltrate in the right upper lobe adjacent to the major fissure. There is also persistent slight perihilar infiltrate on the right.   Heart size and vascularity are normal.  No appreciable effusions.   IMPRESSION: Improving of pulmonary infiltrates.   ASSESSMENT/PLAN:  Vaginal Yeast - Diflucan 150 mg PO X 1 CKD, stage IV - check BMP in 1 week   CPT CODE:  57846  Seth Bake- NP [facility] [phone]

## 2014-03-27 ENCOUNTER — Encounter: Payer: Self-pay | Admitting: Adult Health

## 2014-03-27 ENCOUNTER — Non-Acute Institutional Stay (SKILLED_NURSING_FACILITY): Payer: Commercial Managed Care - HMO | Admitting: Adult Health

## 2014-03-27 DIAGNOSIS — E785 Hyperlipidemia, unspecified: Secondary | ICD-10-CM

## 2014-03-27 DIAGNOSIS — I151 Hypertension secondary to other renal disorders: Secondary | ICD-10-CM

## 2014-03-27 DIAGNOSIS — E1129 Type 2 diabetes mellitus with other diabetic kidney complication: Secondary | ICD-10-CM

## 2014-03-27 DIAGNOSIS — I4891 Unspecified atrial fibrillation: Secondary | ICD-10-CM

## 2014-03-27 DIAGNOSIS — I150 Renovascular hypertension: Secondary | ICD-10-CM

## 2014-03-27 DIAGNOSIS — N184 Chronic kidney disease, stage 4 (severe): Secondary | ICD-10-CM

## 2014-03-27 DIAGNOSIS — D62 Acute posthemorrhagic anemia: Secondary | ICD-10-CM

## 2014-03-27 DIAGNOSIS — N189 Chronic kidney disease, unspecified: Secondary | ICD-10-CM

## 2014-03-27 DIAGNOSIS — K589 Irritable bowel syndrome without diarrhea: Secondary | ICD-10-CM

## 2014-03-27 DIAGNOSIS — G4733 Obstructive sleep apnea (adult) (pediatric): Secondary | ICD-10-CM

## 2014-03-27 DIAGNOSIS — N2889 Other specified disorders of kidney and ureter: Secondary | ICD-10-CM

## 2014-03-27 DIAGNOSIS — E1122 Type 2 diabetes mellitus with diabetic chronic kidney disease: Secondary | ICD-10-CM

## 2014-03-27 DIAGNOSIS — I5032 Chronic diastolic (congestive) heart failure: Secondary | ICD-10-CM

## 2014-03-27 NOTE — Progress Notes (Signed)
Patient ID: [name], female   DOB: 12/29/1945, 68 y.o.   MRN: [id number]             PROGRESS NOTE  DATE: 03/27/14  FACILITY: Nursing Home Location: [facility]  LEVEL OF CARE: SNF (31)  Acute Visit  CHIEF COMPLAINT:  Discharge Notes  HISTORY OF PRESENT ILLNESS: This is a 68 year old female who is for discharge home with Home health PT, OT, Nursing, CNA and Social worker. DME: O2 @ 2L/min via [place] portable (gas). Patient's O2 sat drops to 86% on room air. She has been admitted to [facility] on 03/13/14 from [facility] with Acute respiratory failure. She was treated with antibiotic for possible pneumonia VS pulmonary edema. Patient was admitted to this facility for short-term rehabilitation after the patient's recent hospitalization.  Patient has completed SNF rehabilitation and therapy has cleared the patient for discharge.   REASSESSMENT OF ONGOING PROBLEM(S):  HTN: Pt 's HTN remains stable.  Denies CP, sob, DOE, pedal edema, headaches, dizziness or visual disturbances.  No complications from the medications currently being used.  Last BP : 134/78  ATRIAL FIBRILLATION: the patients atrial fibrillation remains stable.  The patient denies DOE, tachycardia, orthopnea, transient neurological sx, palpitations, & PNDs.  No complications noted from the medications currently being used.  ANEMIA: The anemia has been stable. The patient denies fatigue, melena or hematochezia. No complications from the medications currently being used. 7/15 hgb 9.9  PAST MEDICAL HISTORY : Reviewed.  No changes/see problem list  CURRENT MEDICATIONS: Reviewed per MAR/see medication list  REVIEW OF SYSTEMS:  GENERAL: no change in appetite, no fatigue, no weight changes, no fever, chills or weakness RESPIRATORY: no cough, SOB, DOE, wheezing, hemoptysis CARDIAC: no chest pain, or palpitations, +edema GI: no abdominal pain, diarrhea, constipation, heart burn, nausea or  vomiting  PHYSICAL EXAMINATION  GENERAL: no acute distress, normal body habitus NECK: supple, trachea midline, no neck masses, no thyroid tenderness, no thyromegaly RESPIRATORY: breathing is even & unlabored, BS CTAB CARDIAC: irregularly irregular, no murmur,no extra heart sounds, BLE edema 2+ GI: abdomen soft, normal BS, no masses, no tenderness, no hepatomegaly, no splenomegaly EXTREMITIES:  Able to move all 4 extremities PSYCHIATRIC: the patient is alert & oriented to person, affect & behavior appropriate  LABS/RADIOLOGY: 03/25/14  WBC 6.8 hemoglobin 9.9 hematocrit 33.7 sodium 138 potassium 4.1 glucose 174 BUN 24 creatinine of 2.3 calcium 8.9 03/23/14  sodium 140 potassium 3.6 glucose 192 BUN 17 creatinine 1.9 calcium 9.2 03/20/14  WBC 6.7 hemoglobin 9.7 hematocrit 32.9 Labs reviewed: Basic Metabolic Panel:  Recent Labs  03/05/14 1710  03/11/14 0355 03/12/14 0404 03/13/14 0641  NA  --   < > 142 145 145  K  --   < > 3.9 3.7 3.4*  CL  --   < > 104 106 105  CO2  --   < > 24 25 25   GLUCOSE  --   < > 170* 124* 121*  BUN  --   < > 17 18 16   CREATININE  --   < > 2.34* 2.26* 2.10*  CALCIUM  --   < > 9.3 9.4 9.2  MG 2.1  --   --   --   --   PHOS 1.5*  --   --   --   --   < > = values in this interval not displayed. Liver Function Tests:  Recent Labs  12/09/13 1330 12/15/13 1036 03/04/14 0338  AST 23  20 14  ALT 14 12 7   ALKPHOS 66 60 87  BILITOT 0.3 0.4 0.5  PROT 7.5 7.4 6.7  ALBUMIN 3.7 3.7 2.9*   CBC:  Recent Labs  03/07/14 0400 03/08/14 0300 03/09/14 0312  03/11/14 0355 03/12/14 0404 03/13/14 0641  WBC 11.4* 10.7* 10.6*  < > 9.7 9.7 8.9  NEUTROABS 8.1* 6.4 6.1  --   --   --   --   HGB 9.8* 9.1* 8.6*  < > 8.8* 8.8* 9.3*  HCT 30.6* 27.4* 26.9*  < > 27.4* 28.4* 29.8*  MCV 91.9 91.6 91.2  < > 91.9 91.9 92.0  PLT 260 267 324  < > 410* 474* 499*  < > = values in this interval not displayed.  Cardiac Enzymes:  Recent Labs  03/03/14 2255 03/04/14 0338  03/04/14 1040  TROPONINI <0.30 <0.30 <0.30    CBG:  Recent Labs  03/12/14 2118 03/13/14 0611 03/13/14 1158  GLUCAP 161* 128* 126*   EXAM: PORTABLE CHEST - 1 VIEW   COMPARISON:  03/09/2014 and 03/08/2014 and 01/06/2014   FINDINGS: Pulmonary infiltrates in the left upper and lower lung zones have significantly improved. There is persistent peribronchial thickening. There is a persistent area of hazy infiltrate in the right upper lobe adjacent to the major fissure. There is also persistent slight perihilar infiltrate on the right.   Heart size and vascularity are normal.  No appreciable effusions.   IMPRESSION: Improving of pulmonary infiltrates.   ASSESSMENT/PLAN:  Chronic diastolic heart failure - stable; continue Demadex Atrial fibrillation with RVR status post failed cardioversion - rate controlled; continue amiodarone, diltiazem, Lopressor and Eliquis; follow-up with cardiologist Diabetes mellitus, type II with renal complications - continue Lantus and Humalog Anemia, acute blood loss - stable Hypertension - well controlled; continue Catapres and Lopressor IBS -  Continue Linzess and Bentyl PRN Hyperlipidemia - continue Crestor OSA - CPAP @ night CKD, stage IV - follow-up with renal outpatient    I have filled out patient's discharge paperwork and written prescriptions.  Patient will receive home health PT, OT, ST, Nursing, Education officer, museum and CNA.  DME provided:  O2 @ 2L/min via [place] portable (gas). Patient's O2 sat drops to 86% on room air.  Total discharge time: Greater than 30 minutes  Discharge time involved coordination of the discharge process with social worker, nursing staff and therapy department. Medical justification for home health services/DME verified.    CPT CODE: 65784  Seth Bake- NP [facility] [phone]                PROGRESS NOTE  DATE: 03/27/2014   FACILITY: [facility]  LEVEL OF CARE: SNF  (31)  Discharge Visit  CHIEF COMPLAINT:  Manage  HISTORY OF PRESENT ILLNESS: I was requested by the social worker to perform face-to-face evaluation for discharge:  Patient was admitted to this facility for short-term rehabilitation after the patient's recent hospitalization.  Patient has completed SNF rehabilitation and therapy has cleared the patient for discharge.  Reassessment of ongoing problem(s):  PAST MEDICAL HISTORY : Reviewed.  No changes/see problem list  CURRENT MEDICATIONS: Reviewed per MAR/see medication list  REVIEW OF SYSTEMS:  GENERAL: no change in appetite, no fatigue, no weight changes, no fever, chills or weakness RESPIRATORY: no cough, SOB, DOE, wheezing, hemoptysis CARDIAC: no chest pain, edema or palpitations GI: no abdominal pain, diarrhea, constipation, heart burn, nausea or vomiting  PHYSICAL EXAMINATION  VS:  See VS section  GENERAL: no acute  distress, normal body habitus EYES: conjunctivae normal, sclerae normal, normal eye lids NECK: supple, trachea midline, no neck masses, no thyroid tenderness, no thyromegaly LYMPHATICS: no LAN in the neck, no supraclavicular LAN RESPIRATORY: breathing is even & unlabored, BS CTAB CARDIAC: RRR, no murmur,no extra heart sounds, no edema GI: abdomen soft, normal BS, no masses, no tenderness, no hepatomegaly, no splenomegaly PSYCHIATRIC: the patient is alert & oriented to person, affect & behavior appropriate  LABS/RADIOLOGY:  ASSESSMENT/PLAN:  I have filled out patient's discharge paperwork and written prescriptions.  Patient will receive home health PT, OT, ST, Nursing, Education officer, museum and CNA.  DME provided:  O2 @ 2L/min via [place] portable (gas). Patient's O2 sat drops to 86% on room air.  Total discharge time: Greater than 30 minutes  Discharge time involved coordination of the discharge process with social worker, nursing staff and therapy department. Medical justification for home health services/DME  verified.  CPT CODE: [place], MD [place] [phone]

## 2014-03-31 ENCOUNTER — Telehealth (HOSPITAL_COMMUNITY): Payer: Self-pay | Admitting: Vascular Surgery

## 2014-03-31 NOTE — Telephone Encounter (Signed)
Pt daughter called did not leave her name on message she has some concerns about her mother and would like to speak to someone.. Please advise

## 2014-04-01 NOTE — Telephone Encounter (Signed)
Returned call, patient very fatigued since changing medications on 7/15, will bring in tomorrow to be seen.

## 2014-04-02 ENCOUNTER — Ambulatory Visit (HOSPITAL_COMMUNITY)
Admission: RE | Admit: 2014-04-02 | Discharge: 2014-04-02 | Disposition: A | Discharge status: Home / Self Care | Payer: Medicare HMO | Source: Ambulatory Visit | Attending: Internal Medicine | Admitting: Internal Medicine

## 2014-04-02 ENCOUNTER — Telehealth: Payer: Self-pay | Admitting: Nurse Practitioner

## 2014-04-02 ENCOUNTER — Encounter (HOSPITAL_COMMUNITY): Payer: Self-pay

## 2014-04-02 ENCOUNTER — Other Ambulatory Visit: Payer: Self-pay | Admitting: Nurse Practitioner

## 2014-04-02 VITALS — BP 102/72 | HR 68 | Wt 234.4 lb

## 2014-04-02 DIAGNOSIS — J45909 Unspecified asthma, uncomplicated: Secondary | ICD-10-CM | POA: Insufficient documentation

## 2014-04-02 DIAGNOSIS — I4892 Unspecified atrial flutter: Secondary | ICD-10-CM | POA: Diagnosis not present

## 2014-04-02 DIAGNOSIS — Z794 Long term (current) use of insulin: Secondary | ICD-10-CM | POA: Insufficient documentation

## 2014-04-02 DIAGNOSIS — E785 Hyperlipidemia, unspecified: Secondary | ICD-10-CM | POA: Insufficient documentation

## 2014-04-02 DIAGNOSIS — I509 Heart failure, unspecified: Secondary | ICD-10-CM | POA: Insufficient documentation

## 2014-04-02 DIAGNOSIS — J40 Bronchitis, not specified as acute or chronic: Secondary | ICD-10-CM

## 2014-04-02 DIAGNOSIS — N184 Chronic kidney disease, stage 4 (severe): Secondary | ICD-10-CM | POA: Diagnosis not present

## 2014-04-02 DIAGNOSIS — Z9981 Dependence on supplemental oxygen: Secondary | ICD-10-CM | POA: Insufficient documentation

## 2014-04-02 DIAGNOSIS — Z8249 Family history of ischemic heart disease and other diseases of the circulatory system: Secondary | ICD-10-CM | POA: Insufficient documentation

## 2014-04-02 DIAGNOSIS — I5032 Chronic diastolic (congestive) heart failure: Secondary | ICD-10-CM | POA: Insufficient documentation

## 2014-04-02 DIAGNOSIS — I4891 Unspecified atrial fibrillation: Secondary | ICD-10-CM | POA: Diagnosis not present

## 2014-04-02 DIAGNOSIS — E119 Type 2 diabetes mellitus without complications: Secondary | ICD-10-CM | POA: Diagnosis not present

## 2014-04-02 DIAGNOSIS — I129 Hypertensive chronic kidney disease with stage 1 through stage 4 chronic kidney disease, or unspecified chronic kidney disease: Secondary | ICD-10-CM | POA: Insufficient documentation

## 2014-04-02 LAB — BASIC METABOLIC PANEL
Anion gap: 13 (ref 5–15)
BUN: 29 mg/dL — ABNORMAL HIGH (ref 6–23)
CO2: 26 mEq/L (ref 19–32)
Calcium: 9.4 mg/dL (ref 8.4–10.5)
Chloride: 99 mEq/L (ref 96–112)
Creatinine, Ser: 2.66 mg/dL — ABNORMAL HIGH (ref 0.50–1.10)
GFR calc Af Amer: 20 mL/min — ABNORMAL LOW (ref >90)
GFR calc non Af Amer: 17 mL/min — ABNORMAL LOW (ref >90)
Glucose, Bld: 223 mg/dL — ABNORMAL HIGH (ref 70–99)
Potassium: 5.6 mEq/L — ABNORMAL HIGH (ref 3.7–5.3)
Sodium: 138 mEq/L (ref 137–147)

## 2014-04-02 MED ORDER — METOLAZONE 2.5 MG PO TABS: 2.5000 mg | ORAL_TABLET | ORAL | Status: DC

## 2014-04-02 MED ORDER — POTASSIUM CHLORIDE CRYS ER 20 MEQ PO TBCR: 20.0000 meq | EXTENDED_RELEASE_TABLET | Freq: Two times a day (BID) | ORAL | Status: DC

## 2014-04-02 MED ORDER — DOXYCYCLINE HYCLATE 100 MG PO CAPS: 100.0000 mg | ORAL_CAPSULE | Freq: Two times a day (BID) | ORAL | Status: DC

## 2014-04-02 MED ORDER — LEVOFLOXACIN 750 MG PO TABS: 750.0000 mg | ORAL_TABLET | ORAL | Status: DC

## 2014-04-02 NOTE — Progress Notes (Signed)
Patient ID: [name], female   DOB: 1946-08-05, 68 y.o.   MRN: [id number]  Primary Cardiologist: Dr. Einar Gip PCP: Dr. [name] Nephrologist: Dr. Florene Glen  HPI: [name] is a 68 year old woman with hypertension, hyperlipidemia, bronchial asthma, diabetes mellitus, CKD stage IV (baseline cr ~2.5), atrial fibrillation and diastolic HF.  She was admitted in 4/15 for R TKR. That hospitalization complicated by a/c diastolic HF with respiratory distress requiring non-rebreather support. Weight on discharge was 226 pounds. ABG at that time 7.4/34/91/97%. Cr peaked at 2.9  Admitted 6/30-7/10/15 for SOB and CP following scheduled cardioversion. Found to have severe aspiration PNA and was intubated. She maintained SR for short period of time and then went back into Afib/Aflutter.  HR controlled on amio and diltiazem. Discharged to [facility] at a weight of 232 lbs.   [facility] Follow up for Heart Failure: Saw [name] 2 weeks ago. Still SOB. Lopressor increased due to HTN. Has gone home from SNF. [name] coming to house to help. Weighs most days. Weight trending up slowly. Was 231 at last visit. Today is 234. Continues to wear O2. Very SOB at rest and with any movement. No edema. + wheezing and coughing. Mild yellow sputum. No fever. Subjective chills. Daughter says she doesn't always take pills on time  ROS: All systems negative except as listed in HPI, PMH and Problem List.  SH:  History   Social History  . Marital Status: Widowed    Spouse Name: N/A    Number of Children: 3  . Years of Education: N/A   Occupational History  . Not on file.   Social History Main Topics  . Smoking status: Never Smoker   . Smokeless tobacco: Never Used  . Alcohol Use: No  . Drug Use: No  . Sexual Activity: Not Currently   Other Topics Concern  . Not on file   Social History Narrative  . No narrative on file    FH:  Family History  Problem Relation Age of Onset  . Heart disease  Mother   . Cancer Mother     bladder  . Kidney disease Mother   . Ovarian cancer Daughter   . Stomach cancer Maternal Uncle   . Colon cancer Maternal Aunt   . Esophageal cancer Neg Hx     Past Medical History  Diagnosis Date  . Atrial flutter   . Hypertension   . Diabetes mellitus   . Diastolic heart failure     a. EF 60-65%, RV nl (03/2014)  . Asthma   . Hyperlipidemia   . Fatty liver   . Esophageal dysmotility   . Arthritis   . Sleep apnea     wears CPAP  . Fatty tumor fatty tumor back  . Coronary atherosclerosis of native coronary artery   . Morbid obesity   . Myocardial infarction 2009  . Dysrhythmia     afib,flutter hx  . Heart murmur   . Peripheral vascular disease   . Pneumonia     hx  . GERD (gastroesophageal reflux disease)     barrets esophagus  . Anginal pain     occ; non-ischemic Lexiscan 09/2012  . Kidney disease     CKD stage IV (Dr. Erling Cruz)    Current Outpatient Prescriptions  Medication Sig Dispense Refill  . acetaminophen (TYLENOL) 500 MG tablet Take 500 mg by mouth every 6 (six) hours as needed for mild pain.      Marland Kitchen amiodarone (PACERONE) 200 MG  tablet Take 1 tablet (200 mg total) by mouth 2 (two) times daily.  60 tablet  0  . amitriptyline (ELAVIL) 25 MG tablet Take 25 mg by mouth at bedtime.        Marland Kitchen apixaban (ELIQUIS) 5 MG TABS tablet Take 5 mg by mouth 2 (two) times daily.      . cloNIDine (CATAPRES) 0.1 MG tablet Take 3 tablets (0.3 mg total) by mouth 3 (three) times daily.  90 tablet  0  . dicyclomine (BENTYL) 10 MG capsule Take 10 mg by mouth 4 (four) times daily as needed (IBS).      Marland Kitchen diltiazem (CARDIZEM CD) 240 MG 24 hr capsule Take 1 capsule (240 mg total) by mouth daily.  30 capsule  0  . esomeprazole (NEXIUM) 40 MG capsule Take 40 mg by mouth daily at 12 noon.      . febuxostat (ULORIC) 40 MG tablet Take 40 mg by mouth daily.       . insulin glargine (LANTUS) 100 UNIT/ML injection Inject 0.4 mLs (40 Units total) into the skin  daily.  10 mL  11  . insulin lispro (HUMALOG) 100 UNIT/ML injection Inject 0.2 mLs (20 Units total) into the skin 3 (three) times daily after meals.  10 mL  11  . Linaclotide (LINZESS) 145 MCG CAPS capsule Take 290 mcg by mouth daily.      . methocarbamol (ROBAXIN-750) 750 MG tablet Take 1 tablet (750 mg total) by mouth every 8 (eight) hours as needed for muscle spasms.  40 tablet  0  . metoprolol (LOPRESSOR) 50 MG tablet Take 1.5 tablets (75 mg total) by mouth 2 (two) times daily.  90 tablet  0  . nitroGLYCERIN (NITROSTAT) 0.4 MG SL tablet Place 0.4 mg under the tongue every 5 (five) minutes as needed for chest pain.       Marland Kitchen oxyCODONE-acetaminophen (PERCOCET/ROXICET) 5-325 MG per tablet Take one tablet by mouth every 6 hours as needed for mild pain; Take two tablets by mouth every 6 hours as needed for moderate to severe pain  240 tablet  0  . potassium chloride SA (K-DUR,KLOR-CON) 20 MEQ tablet Take 1 tablet (20 mEq total) by mouth 2 (two) times daily.      . ranitidine (ZANTAC) 150 MG tablet Take 1 tablet (150 mg total) by mouth at bedtime.  30 tablet  11  . rosuvastatin (CRESTOR) 10 MG tablet Take 10 mg by mouth daily.        Marland Kitchen torsemide (DEMADEX) 20 MG tablet Take 40 mg by mouth 2 (two) times daily.      . Vitamin D, Ergocalciferol, (DRISDOL) 50000 UNITS CAPS capsule Take 50,000 Units by mouth every Monday.      Penne Lash HFA 45 MCG/ACT inhaler Inhale 2 puffs into the lungs every 4 (four) hours as needed for wheezing or shortness of breath.  1 Inhaler  1  . zolpidem (AMBIEN) 10 MG tablet Take 10 mg by mouth at bedtime as needed for sleep.        No current facility-administered medications for this encounter.    Filed Vitals:   04/02/14 1043  BP: 102/72  Pulse: 68  Weight: 234 lb 6.4 oz (106.323 kg)  SpO2: 90%    PHYSICAL EXAM: General: Obese, NAD, in wheelchair.  HEENT: normal  Neck: supple. JVP hard to see d/t body habitu; Carotids 2+ bilat; no bruits. No lymphadenopathy or  thryomegaly appreciate Cor: PMI nonpalpbale. Distant. Irregular. No obvious murmur  Lungs:  long exp phase basilar crackles.  Abdomen: Obese soft, nontender. +distended. No hepatosplenomegaly. No bruits or masses. Good bowel sounds.  Extremities: no cyanosis, clubbing, rash, warm. 1+ bilateral edema  Neuro: alert & orientedx3, cranial nerves grossly intact. moves all 4 extremities w/o difficulty. Affect pleasant     ASSESSMENT & PLAN:  1) Chronic diastolic HF: EF 123456 (0000000) - Continues to be very SOB. I suspect this is multifactorial. Very hard to assess volume status but weight is up - I have encouraged her to take diuretics as prescribed - Will add metolazone 2.5 mg on Monday and Friday. First dose today. Take KCL 20 with it - Reinforced the need and importance of daily weights, a low sodium diet, and fluid restriction (less than 2 L a day). Instructed to call the HF clinic if weight increases more than 3 lbs overnight or 5 lbs in a week.  2) Afib/Aflutter - Rate controlled but suspect she doesn't tolerate this well. Will continue diltiazem, amiodarone and BB. No bleeding issues, continue Eliquis 5 mg BID. Plan DC-CV as soon as she is more stable.  3) CKD stage IV - Baseline Cr 2.1-2.4. Repeat BMET today and 1 week Follow up with nephrology.  4) HTN - Blood pressure well controlled. Continue current regimen. 5) Possible bronchitis. - CXR today. Levaquin x 5 days,   F/U 3 weeks. [name] MD 10:55 AM

## 2014-04-02 NOTE — Patient Instructions (Signed)
Labs to day and again in one week (BMET)  START Metolazone 2.5 mg every Monday and Friday ADD additional 20 meq Potassium on Monday and Friday  A chest x-ray takes a picture of the organs and structures inside the chest, including the heart, lungs, and blood vessels. This test can show several things, including, whether the heart is enlarges; whether fluid is building up in the lungs; and whether pacemaker / defibrillator leads are still in place.  Your physician recommends that you schedule a follow-up appointment in: 2 weeks  Do the following things EVERYDAY: 1) Weigh yourself in the morning before breakfast. Write it down and keep it in a log. 2) Take your medicines as prescribed 3) Eat low salt foods-Limit salt (sodium) to 2000 mg per day.  4) Stay as active as you can everyday Limit all fluids for the day to less than 2 liters.

## 2014-04-02 NOTE — Addendum Note (Signed)
Encounter addended by: Kerry Dory, CMA on: 04/02/2014 11:22 AM<BR>     Documentation filed: Visit Diagnoses, Patient Instructions Section, Orders

## 2014-04-02 NOTE — Telephone Encounter (Signed)
Pts dtr called.  Pt was seen today in CHF clinic with concern for possible bronchitis.  She was prescribed levaquin.  Pharmacist pointed out that this is contraindicated in combination with amiodarone, which she is also on.  I will d/c levaquin and have sent in Rx for doxycycline 100mg  po bid, #14, 0 refills.  Pts dtr verbalized understanding.

## 2014-04-08 ENCOUNTER — Encounter (HOSPITAL_COMMUNITY): Payer: Medicare HMO

## 2014-04-09 ENCOUNTER — Telehealth (HOSPITAL_COMMUNITY): Payer: Self-pay | Admitting: Vascular Surgery

## 2014-04-09 NOTE — Telephone Encounter (Signed)
Home health nurse left message need order clarified

## 2014-04-10 ENCOUNTER — Other Ambulatory Visit (HOSPITAL_COMMUNITY): Payer: Medicare HMO

## 2014-04-15 ENCOUNTER — Telehealth (HOSPITAL_COMMUNITY): Payer: Self-pay | Admitting: *Deleted

## 2014-04-15 DIAGNOSIS — R1310 Dysphagia, unspecified: Secondary | ICD-10-CM

## 2014-04-15 DIAGNOSIS — J189 Pneumonia, unspecified organism: Secondary | ICD-10-CM

## 2014-04-15 DIAGNOSIS — J690 Pneumonitis due to inhalation of food and vomit: Secondary | ICD-10-CM

## 2014-04-15 NOTE — Telephone Encounter (Signed)
Left message to call back  

## 2014-04-15 NOTE — Telephone Encounter (Signed)
Message copied by Scarlette Calico on Wed Apr 15, 2014  2:26 PM ------      Message from: [name] R      Created: Thu Apr 09, 2014  1:45 AM       Recurrent PNA. Please schedule swallow evaluation to exclude aspiration. Refer to pulmonary. Abx were prescribed. ------

## 2014-04-15 NOTE — Telephone Encounter (Signed)
Pt aware, orders placed will schedule appt and call her back, she prefers Fri appt if possible

## 2014-04-16 ENCOUNTER — Telehealth (HOSPITAL_COMMUNITY): Payer: Self-pay | Admitting: Vascular Surgery

## 2014-04-16 NOTE — Telephone Encounter (Signed)
Spoke w/Dr Powell's office she states that pt was instructed by them to decrease Furosemide to 160 mg Twice daily from 160 mg Three times a day, our records indicate pt is on Torsemide not Furosemide, spoke w/pt she is not sure what she is on and her daughter is not at home will talk to her later today to verify

## 2014-04-16 NOTE — Telephone Encounter (Signed)
Pt daughter called wanting to speak to [facility]. Please advise

## 2014-04-16 NOTE — Telephone Encounter (Signed)
Spoke w/pt's daughter, had called her earlier today and discuss lab results, received labs from pcp cr up to 4 they state Dr Florene Glen called to and vised to change a medication but neither the pt nor the daughter are sure what medication it is, have called Dr Abel Presto office and left a mess for them to call me back

## 2014-04-17 NOTE — Telephone Encounter (Signed)
Pt's daughter is going to bring all of pt's medication bottles by today for [place] to review, she will be here around 2 pm

## 2014-04-17 NOTE — Telephone Encounter (Signed)
Pt's daughter brought all of pt's medications in list verified and everything is correct, pt has been taking Torsemide 40 mg bid and metolazone 2.5 mg every Mon and Fri as directed, pt's daughter states her wt is down to 228 today denies SOB or edema, per [name], NP stop metolazone, hold Torsemide Sat and Sun (as pt has already taken both doses today) and take an extra potassium today as labs show cr 4.0 k 3.3, pt's daughter is aware and verbalizes understanding, instructions written for her as well.  Pt will keep appt as scheduled on Thur 8/20 and will recheck labs then

## 2014-04-22 ENCOUNTER — Telehealth (HOSPITAL_COMMUNITY): Payer: Self-pay | Admitting: Vascular Surgery

## 2014-04-22 ENCOUNTER — Ambulatory Visit (HOSPITAL_COMMUNITY)
Admission: RE | Admit: 2014-04-22 | Discharge: 2014-04-22 | Disposition: A | Discharge status: Home / Self Care | Payer: Medicare HMO | Source: Ambulatory Visit | Attending: Internal Medicine | Admitting: Internal Medicine

## 2014-04-22 ENCOUNTER — Encounter (HOSPITAL_COMMUNITY): Payer: Self-pay

## 2014-04-22 VITALS — BP 104/60 | HR 56 | Wt 223.8 lb

## 2014-04-22 DIAGNOSIS — I509 Heart failure, unspecified: Secondary | ICD-10-CM | POA: Diagnosis present

## 2014-04-22 DIAGNOSIS — I5032 Chronic diastolic (congestive) heart failure: Secondary | ICD-10-CM

## 2014-04-22 DIAGNOSIS — Z760 Encounter for issue of repeat prescription: Secondary | ICD-10-CM | POA: Diagnosis present

## 2014-04-22 DIAGNOSIS — N184 Chronic kidney disease, stage 4 (severe): Secondary | ICD-10-CM

## 2014-04-22 DIAGNOSIS — I4891 Unspecified atrial fibrillation: Secondary | ICD-10-CM

## 2014-04-22 DIAGNOSIS — I480 Paroxysmal atrial fibrillation: Secondary | ICD-10-CM

## 2014-04-22 MED ORDER — CLONIDINE HCL 0.1 MG PO TABS: 0.2000 mg | ORAL_TABLET | Freq: Three times a day (TID) | ORAL | Status: DC

## 2014-04-22 MED ORDER — AMIODARONE HCL 200 MG PO TABS
200.0000 mg | ORAL_TABLET | Freq: Every day | ORAL | Status: DC
Start: 2014-04-22 — End: 2014-04-29

## 2014-04-22 NOTE — Patient Instructions (Signed)
Decrease your clonidine to 0.2 mg (2 tablets) three times a day.  Decrease amiodarone to 200 mg (1 tablet) daily.   Do not take metolazone currently.   Please tell [facility] to call us or fax [place] with medication changes or labs.  F/U 1 month  Call any issues  Do the following things EVERYDAY: 1) Weigh yourself in the morning before breakfast. Write it down and keep it in a log. 2) Take your medicines as prescribed 3) Eat low salt foods-Limit salt (sodium) to 2000 mg per day.  4) Stay as active as you can everyday 5) Limit all fluids for the day to less than 2 liters 6)

## 2014-04-22 NOTE — Progress Notes (Signed)
Patient ID: [name], female   DOB: [birth year], 68 y.o.   MRN: [id number]  Primary Cardiologist: Dr. Einar Gip PCP: Dr. [name] Nephrologist: Dr. Florene Glen  HPI: [name] is a 68 year old woman with hypertension, hyperlipidemia, bronchial asthma, diabetes mellitus, CKD stage IV (baseline cr ~2.5), atrial fibrillation and diastolic HF.  She was admitted in 4/15 for R TKR. That hospitalization complicated by a/c diastolic HF with respiratory distress requiring non-rebreather support. Weight on discharge was 226 pounds. ABG at that time 7.4/34/91/97%. Cr peaked at 2.9  Admitted 6/30-7/10/15 for SOB and CP following scheduled cardioversion. Found to have severe aspiration PNA and was intubated. She maintained SR for short period of time and then went back into Afib/Aflutter.  HR controlled on amio and diltiazem. Discharged to [facility] at a weight of 232 lbs.   Follow up for Heart Failure: Since last visit multiple issues with medications and renal function increased. Patient's daughter brought all medications to office to review and at that time we stopped metolazone and had her hold torsemide for 2 days. She had repeat labs yesterday at [facility]. Does not feel very good. Call last night that potassium dangerously low and she took 4 extra potassium today. Repeat CXR showed possible reoccurring PNA and placed on abx.  +SOB even with oxygen on. [facility] following with PT/RN. Weight at home 220 lbs. Denies orthopnea, PND or CP. Following a low salt diet and drinking less than 2L a day.  Labs: 03/13/14: K+ 3.4, creatinine 2.10, BUN 16 04/02/14: K 5.6, creatinine 2.66, BUN 29 04/13/14: K 4.0, creatinine 2.66, K 5.6  ROS: All systems negative except as listed in HPI, PMH and Problem List.  SH:  History   Social History  . Marital Status: Widowed    Spouse Name: N/A    Number of Children: 3  . Years of Education: N/A   Occupational History  . Not on file.   Social History Main  Topics  . Smoking status: Never Smoker   . Smokeless tobacco: Never Used  . Alcohol Use: No  . Drug Use: No  . Sexual Activity: Not Currently   Other Topics Concern  . Not on file   Social History Narrative  . No narrative on file    FH:  Family History  Problem Relation Age of Onset  . Heart disease Mother   . Cancer Mother     bladder  . Kidney disease Mother   . Ovarian cancer Daughter   . Stomach cancer Maternal Uncle   . Colon cancer Maternal Aunt   . Esophageal cancer Neg Hx     Past Medical History  Diagnosis Date  . Atrial flutter   . Hypertension   . Diabetes mellitus   . Diastolic heart failure     a. EF 60-65%, RV nl (03/2014)  . Asthma   . Hyperlipidemia   . Fatty liver   . Esophageal dysmotility   . Arthritis   . Sleep apnea     wears CPAP  . Fatty tumor fatty tumor back  . Coronary atherosclerosis of native coronary artery   . Morbid obesity   . Myocardial infarction 2009  . Dysrhythmia     afib,flutter hx  . Heart murmur   . Peripheral vascular disease   . Pneumonia     hx  . GERD (gastroesophageal reflux disease)     barrets esophagus  . Anginal pain     occ; non-ischemic Lexiscan 09/2012  . Kidney disease  CKD stage IV (Dr. Erling Cruz)    Current Outpatient Prescriptions  Medication Sig Dispense Refill  . acetaminophen (TYLENOL) 500 MG tablet Take 500 mg by mouth every 6 (six) hours as needed for mild pain.      Marland Kitchen amiodarone (PACERONE) 200 MG tablet Take 1 tablet (200 mg total) by mouth 2 (two) times daily.  60 tablet  0  . amitriptyline (ELAVIL) 25 MG tablet Take 25 mg by mouth at bedtime.        Marland Kitchen apixaban (ELIQUIS) 5 MG TABS tablet Take 5 mg by mouth 2 (two) times daily.      . cloNIDine (CATAPRES) 0.1 MG tablet Take 3 tablets (0.3 mg total) by mouth 3 (three) times daily.  90 tablet  0  . dicyclomine (BENTYL) 10 MG capsule Take 10 mg by mouth 4 (four) times daily as needed (IBS).      Marland Kitchen diltiazem (CARDIZEM CD) 240 MG 24 hr  capsule Take 1 capsule (240 mg total) by mouth daily.  30 capsule  0  . esomeprazole (NEXIUM) 40 MG capsule Take 40 mg by mouth daily at 12 noon.      . febuxostat (ULORIC) 40 MG tablet Take 40 mg by mouth daily.       . insulin glargine (LANTUS) 100 UNIT/ML injection Inject 0.4 mLs (40 Units total) into the skin daily.  10 mL  11  . insulin lispro (HUMALOG) 100 UNIT/ML injection Inject 0.2 mLs (20 Units total) into the skin 3 (three) times daily after meals.  10 mL  11  . Linaclotide (LINZESS) 145 MCG CAPS capsule Take 290 mcg by mouth daily.      . methocarbamol (ROBAXIN-750) 750 MG tablet Take 1 tablet (750 mg total) by mouth every 8 (eight) hours as needed for muscle spasms.  40 tablet  0  . metoprolol (LOPRESSOR) 50 MG tablet Take 1.5 tablets (75 mg total) by mouth 2 (two) times daily.  90 tablet  0  . nitroGLYCERIN (NITROSTAT) 0.4 MG SL tablet Place 0.4 mg under the tongue every 5 (five) minutes as needed for chest pain.       Marland Kitchen oxyCODONE-acetaminophen (PERCOCET/ROXICET) 5-325 MG per tablet Take one tablet by mouth every 6 hours as needed for mild pain; Take two tablets by mouth every 6 hours as needed for moderate to severe pain  240 tablet  0  . potassium chloride SA (K-DUR,KLOR-CON) 20 MEQ tablet Take 1 tablet (20 mEq total) by mouth 2 (two) times daily.  75 tablet  6  . ranitidine (ZANTAC) 150 MG tablet Take 1 tablet (150 mg total) by mouth at bedtime.  30 tablet  11  . rosuvastatin (CRESTOR) 10 MG tablet Take 10 mg by mouth daily.        Marland Kitchen torsemide (DEMADEX) 20 MG tablet Take 40 mg by mouth 2 (two) times daily.      . Vitamin D, Ergocalciferol, (DRISDOL) 50000 UNITS CAPS capsule Take 50,000 Units by mouth every Monday.      Penne Lash HFA 45 MCG/ACT inhaler Inhale 2 puffs into the lungs every 4 (four) hours as needed for wheezing or shortness of breath.  1 Inhaler  1  . zolpidem (AMBIEN) 10 MG tablet Take 10 mg by mouth at bedtime as needed for sleep.        No current  facility-administered medications for this encounter.    Filed Vitals:   04/22/14 1505  BP: 104/60  Pulse: 56  Weight: 223 lb 12.8 oz (  101.515 kg)  SpO2: 93%    PHYSICAL EXAM: General: Obese, NAD, in wheelchair. Fatigued appearing, daughter present HEENT: normal  Neck: supple. JVP hard to see d/t body habitus but does not appear elevated; Carotids 2+ bilat; no bruits. No lymphadenopathy or thryomegaly appreciate Cor: PMI nonpalpbale. Distant. Irregular. No obvious murmur  Lungs:  Diminished in the bases Abdomen: Obese soft, nontender. Non-distended. No hepatosplenomegaly. No bruits or masses. Good bowel sounds.  Extremities: no cyanosis, clubbing, rash, warm. Trace edema Neuro: alert & orientedx3, cranial nerves grossly intact. moves all 4 extremities w/o difficulty. Affect pleasant     ASSESSMENT & PLAN:  1) Chronic diastolic HF: EF 123456 (0000000) - NYHA III symptoms and volume status stable. She may even be a little dry. Have asked her to continue to hold metolazone and continue torsemide 40 mg daily. - SBP low and very fatigued. Will cut clonidine back to 0.2 mg TID - Asked to bring all medications to all visits. - Awaiting labs from [facility] the need and importance of daily weights, a low sodium diet, and fluid restriction (less than 2 L a day). Instructed to call the HF clinic if weight increases more than 3 lbs overnight or 5 lbs in a week.  2) Afib/Aflutter - Rate controlled. Will continue diltiazem and BB. Will cut amiodarone back to 200 mg daily. No bleeding issues, continue Eliquis 5 mg BID. Will likely need DC-CV in the future 3) CKD stage IV - Baseline Cr 2.1-2.4. Awaiting on BMET from yesterday at nephrologist 4) HTN - As above cut back on clonidine.  5) Possible bronchitis. - Sounds much better. Scheduled for swallow study on Friday and then Pulmonary in September   F/U 3 weeks. [name] B NP-C 3:20 PM  Patient seen and examined  with [name], NP. We discussed all aspects of the encounter. I agree with the assessment and plan as stated above.   She is more stable today. Respiratory status improved. Agree with changes as above. Agree with possible repeat DC-CV when more stable. She has severe aspiration PNA with last TEE and DC-CV.   Jendaya Gossett,MD 4:22 PM

## 2014-04-22 NOTE — Telephone Encounter (Signed)
Pt was told to call back with all medications she needs refills on.. Atorvatatin 40 mg, Linzess 290 mg , Amiodarone 200 mg, Diltiazem 240 mg, Potassium, Nexuim , Metoprolol, Eliquis and Ranitidine

## 2014-04-23 ENCOUNTER — Inpatient Hospital Stay (HOSPITAL_COMMUNITY): Admission: RE | Admit: 2014-04-23 | Payer: Medicare HMO | Source: Ambulatory Visit

## 2014-04-24 ENCOUNTER — Ambulatory Visit (HOSPITAL_COMMUNITY)
Admission: RE | Admit: 2014-04-24 | Discharge: 2014-04-24 | Disposition: A | Discharge status: Home / Self Care | Payer: Medicare HMO | Source: Ambulatory Visit | Attending: Internal Medicine | Admitting: Internal Medicine

## 2014-04-24 DIAGNOSIS — K449 Diaphragmatic hernia without obstruction or gangrene: Secondary | ICD-10-CM | POA: Diagnosis not present

## 2014-04-24 DIAGNOSIS — K224 Dyskinesia of esophagus: Secondary | ICD-10-CM | POA: Insufficient documentation

## 2014-04-24 DIAGNOSIS — R1310 Dysphagia, unspecified: Secondary | ICD-10-CM

## 2014-04-26 ENCOUNTER — Other Ambulatory Visit: Payer: Self-pay | Admitting: Adult Health

## 2014-04-28 ENCOUNTER — Other Ambulatory Visit: Payer: Self-pay | Admitting: Adult Health

## 2014-04-29 MED ORDER — ESOMEPRAZOLE MAGNESIUM 40 MG PO CPDR: 40.0000 mg | DELAYED_RELEASE_CAPSULE | Freq: Every day | ORAL | Status: DC

## 2014-04-29 MED ORDER — METOPROLOL TARTRATE 50 MG PO TABS: 75.0000 mg | ORAL_TABLET | Freq: Two times a day (BID) | ORAL | Status: DC

## 2014-04-29 MED ORDER — RANITIDINE HCL 150 MG PO TABS: 150.0000 mg | ORAL_TABLET | Freq: Every day | ORAL | Status: DC

## 2014-04-29 MED ORDER — APIXABAN 5 MG PO TABS: 5.0000 mg | ORAL_TABLET | Freq: Two times a day (BID) | ORAL | Status: DC

## 2014-04-29 MED ORDER — ATORVASTATIN CALCIUM 40 MG PO TABS: 40.0000 mg | ORAL_TABLET | Freq: Every day | ORAL | Status: DC

## 2014-04-29 MED ORDER — AMIODARONE HCL 200 MG PO TABS: 200.0000 mg | ORAL_TABLET | Freq: Every day | ORAL | Status: DC

## 2014-04-29 NOTE — Telephone Encounter (Signed)
As requested refills sent to pharm

## 2014-05-01 ENCOUNTER — Ambulatory Visit (INDEPENDENT_AMBULATORY_CARE_PROVIDER_SITE_OTHER): Payer: Medicare Other | Admitting: Ophthalmology

## 2014-05-05 ENCOUNTER — Institutional Professional Consult (permissible substitution): Payer: Commercial Managed Care - HMO | Admitting: Emergency Medicine

## 2014-05-08 ENCOUNTER — Telehealth (HOSPITAL_COMMUNITY): Payer: Self-pay | Admitting: *Deleted

## 2014-05-08 ENCOUNTER — Encounter: Payer: Self-pay | Admitting: Emergency Medicine

## 2014-05-08 ENCOUNTER — Ambulatory Visit (INDEPENDENT_AMBULATORY_CARE_PROVIDER_SITE_OTHER): Payer: Commercial Managed Care - HMO | Admitting: Emergency Medicine

## 2014-05-08 VITALS — BP 122/82 | HR 75 | Ht 64.0 in | Wt 233.0 lb

## 2014-05-08 DIAGNOSIS — R1310 Dysphagia, unspecified: Secondary | ICD-10-CM

## 2014-05-08 DIAGNOSIS — J690 Pneumonitis due to inhalation of food and vomit: Secondary | ICD-10-CM

## 2014-05-08 DIAGNOSIS — G4733 Obstructive sleep apnea (adult) (pediatric): Secondary | ICD-10-CM

## 2014-05-08 NOTE — Addendum Note (Signed)
Addended by: Collene Gobble on: 05/08/2014 02:53 PM   Modules accepted: Level of Service

## 2014-05-08 NOTE — Telephone Encounter (Signed)
Message copied by Scarlette Calico on Fri May 08, 2014 10:46 AM ------      Message from: Jolaine Artist      Created: Sun Apr 26, 2014  9:49 PM       Please refer to GI. ------

## 2014-05-08 NOTE — Telephone Encounter (Signed)
Pt aware, referral placed, [place] please schedule pt with GI, thanks

## 2014-05-08 NOTE — Assessment & Plan Note (Signed)
She stopped her CPAP when she was started on oxygen. We will plan to restart her CPAP and perform an ONO to assess whether she needs oxygen bled into her system

## 2014-05-08 NOTE — Assessment & Plan Note (Signed)
Frequent episodes of aspiration pneumonia that have necessitated hospitalization. She has not been able to be cardioverted because she has had episodic pneumonias and has been too ill. She has documented esophageal dysmotility, pooling and distal spasms. She is almost certainly aspirating and will need swallowing cautions. I will repeat her imaging next visit. Agree with referral to gastroenterology

## 2014-05-08 NOTE — Progress Notes (Signed)
Subjective:    Patient ID: [name], female    DOB: 1945-12-05, 68 y.o.   MRN: [id number]  HPI 68 yo woman, former smoker (15 pk-yrs), hx A Fib, HTN + diastolic dysfxn, OSA on CPAP but not currently using, DM, CKD 4, obesity. She carries a hx of asthma made by . She was unfortunately recently admitted (6-7/'15) with an aspiration PNA that resulted in VDRF. She was seen in CHF clinic 8/19 and was started on abx for persistent B infiltrates.  She underwent swallow eval 8/21 that confirmed impaired esophageal motility, spasm, pooling, concerning high risk aspiration. She has been referred to see GI.  She has a lot of reflux of acid into her mouth. She is wearing o2 at 2L/min. Her breathing is OK if sitting still. She has put on some wt and fluid since last visit to CHF clinic. She has xopenex available to use prn but she rarely uses. Has a dry cough, no sputum, no fevers.    Review of Systems  Constitutional: Positive for appetite change and unexpected weight change. Negative for fever.  HENT: Positive for trouble swallowing. Negative for congestion, dental problem, ear pain, nosebleeds, postnasal drip, rhinorrhea, sinus pressure, sneezing and sore throat.   Eyes: Negative for redness and itching.  Respiratory: Positive for shortness of breath. Negative for cough, chest tightness and wheezing.   Cardiovascular: Positive for leg swelling. Negative for palpitations.  Gastrointestinal: Negative for nausea and vomiting.  Genitourinary: Negative for dysuria.  Musculoskeletal: Positive for joint swelling.  Skin: Negative for rash.  Neurological: Negative for headaches.  Hematological: Does not bruise/bleed easily.  Psychiatric/Behavioral: Negative for dysphoric mood. The patient is not nervous/anxious.    . Past Medical History  Diagnosis Date  . Atrial flutter   . Hypertension   . Diabetes mellitus   . Diastolic heart failure     a. EF 60-65%, RV nl (03/2014)  . Asthma   .  Hyperlipidemia   . Fatty liver   . Esophageal dysmotility   . Arthritis   . Sleep apnea     wears CPAP  . Fatty tumor fatty tumor back  . Coronary atherosclerosis of native coronary artery   . Morbid obesity   . Myocardial infarction 2009  . Dysrhythmia     afib,flutter hx  . Heart murmur   . Peripheral vascular disease   . Pneumonia     hx  . GERD (gastroesophageal reflux disease)     barrets esophagus  . Anginal pain     occ; non-ischemic Lexiscan 09/2012  . Kidney disease     CKD stage IV (Dr. Erling Cruz)     Family History  Problem Relation Age of Onset  . Heart disease Mother   . Cancer Mother     bladder  . Kidney disease Mother   . Ovarian cancer Daughter   . Stomach cancer Maternal Uncle   . Colon cancer Maternal Aunt   . Esophageal cancer Neg Hx      History   Social History  . Marital Status: Widowed    Spouse Name: N/A    Number of Children: 3  . Years of Education: N/A   Occupational History  . Not on file.   Social History Main Topics  . Smoking status: Never Smoker   . Smokeless tobacco: Never Used  . Alcohol Use: No  . Drug Use: No  . Sexual Activity: Not Currently   Other Topics Concern  . Not on file  Social History Narrative  . No narrative on file     Allergies  Allergen Reactions  . Codeine Nausea And Vomiting  . Penicillins Nausea And Vomiting  . Sulfa Antibiotics Itching and Nausea And Vomiting    "everything I seen was red"  . Other Itching    Adhesive from ekg leads     Outpatient Prescriptions Prior to Visit  Medication Sig Dispense Refill  . acetaminophen (TYLENOL) 500 MG tablet Take 500 mg by mouth every 6 (six) hours as needed for mild pain.      Marland Kitchen amiodarone (PACERONE) 200 MG tablet Take 1 tablet (200 mg total) by mouth daily.  60 tablet  2  . amitriptyline (ELAVIL) 25 MG tablet Take 25 mg by mouth at bedtime.        Marland Kitchen apixaban (ELIQUIS) 5 MG TABS tablet Take 1 tablet (5 mg total) by mouth 2 (two) times daily.   60 tablet  2  . atorvastatin (LIPITOR) 40 MG tablet Take 1 tablet (40 mg total) by mouth daily.  90 tablet  3  . cloNIDine (CATAPRES) 0.1 MG tablet Take 2 tablets (0.2 mg total) by mouth 3 (three) times daily.  180 tablet  3  . dicyclomine (BENTYL) 10 MG capsule Take 10 mg by mouth 4 (four) times daily as needed (IBS).      Marland Kitchen diltiazem (CARDIZEM CD) 240 MG 24 hr capsule Take 1 capsule (240 mg total) by mouth daily.  30 capsule  0  . esomeprazole (NEXIUM) 40 MG capsule Take 1 capsule (40 mg total) by mouth daily at 12 noon.  30 capsule  0  . febuxostat (ULORIC) 40 MG tablet Take 40 mg by mouth daily.       . insulin glargine (LANTUS) 100 UNIT/ML injection Inject 0.4 mLs (40 Units total) into the skin daily.  10 mL  11  . insulin lispro (HUMALOG) 100 UNIT/ML injection Inject 0.2 mLs (20 Units total) into the skin 3 (three) times daily after meals.  10 mL  11  . Linaclotide (LINZESS) 145 MCG CAPS capsule Take 290 mcg by mouth daily.      . methocarbamol (ROBAXIN-750) 750 MG tablet Take 1 tablet (750 mg total) by mouth every 8 (eight) hours as needed for muscle spasms.  40 tablet  0  . metoprolol (LOPRESSOR) 50 MG tablet Take 1.5 tablets (75 mg total) by mouth 2 (two) times daily.  90 tablet  2  . nitroGLYCERIN (NITROSTAT) 0.4 MG SL tablet Place 0.4 mg under the tongue every 5 (five) minutes as needed for chest pain.       Marland Kitchen oxyCODONE-acetaminophen (PERCOCET/ROXICET) 5-325 MG per tablet Take one tablet by mouth every 6 hours as needed for mild pain; Take two tablets by mouth every 6 hours as needed for moderate to severe pain  240 tablet  0  . potassium chloride SA (K-DUR,KLOR-CON) 20 MEQ tablet Take 1 tablet (20 mEq total) by mouth 2 (two) times daily.  75 tablet  6  . ranitidine (ZANTAC) 150 MG tablet Take 1 tablet (150 mg total) by mouth at bedtime.  30 tablet  0  . rosuvastatin (CRESTOR) 10 MG tablet Take 10 mg by mouth daily.        Marland Kitchen torsemide (DEMADEX) 20 MG tablet Take 40 mg by mouth 2 (two)  times daily.      . Vitamin D, Ergocalciferol, (DRISDOL) 50000 UNITS CAPS capsule Take 50,000 Units by mouth every Monday.      Penne Lash  HFA 45 MCG/ACT inhaler Inhale 2 puffs into the lungs every 4 (four) hours as needed for wheezing or shortness of breath.  1 Inhaler  1  . zolpidem (AMBIEN) 10 MG tablet Take 10 mg by mouth at bedtime as needed for sleep.        No facility-administered medications prior to visit.         Objective:   Physical Exam Filed Vitals:   05/08/14 1419  BP: 122/82  Pulse: 75  Height: 5\' 4"  (1.626 m)  Weight: 233 lb (105.688 kg)  SpO2: 97%   Gen: Pleasant, well-nourished, in no distress,  normal affect  ENT: No lesions,  mouth clear,  oropharynx clear, no postnasal drip  Neck: No JVD, no TMG, no carotid bruits  Lungs: No use of accessory muscles, no dullness to percussion, clear without rales or rhonchi  Cardiovascular: RRR, heart sounds normal, no murmur or gallops, no peripheral edema  Musculoskeletal: No deformities, no cyanosis or clubbing  Neuro: alert, non focal  Skin: Warm, no lesions or rashes      04/02/14 --  COMPARISON: 03/11/2014  FINDINGS:  Cardiomediastinal silhouette is stable. Patchy bilateral infiltrates  are noted suspicious for recurrent pneumonia. Follow-up to  resolution after treatment recommended. Bony thorax is stable. No  convincing pulmonary edema.  IMPRESSION:  Patchy bilateral infiltrates are noted suspicious for recurrent  pneumonia. Follow-up to resolution after treatment recommended. No  convincing pulmonary edema      Assessment & Plan:  Aspiration pneumonia Frequent episodes of aspiration pneumonia that have necessitated hospitalization. She has not been able to be cardioverted because she has had episodic pneumonias and has been too ill. She has documented esophageal dysmotility, pooling and distal spasms. She is almost certainly aspirating and will need swallowing cautions. I will repeat her imaging  next visit. Agree with referral to gastroenterology  Obstructive sleep apnea She stopped her CPAP when she was started on oxygen. We will plan to restart her CPAP and perform an ONO to assess whether she needs oxygen bled into her system

## 2014-05-08 NOTE — Patient Instructions (Signed)
Please restart your CPAP every night We will perform an oxygen test while you use your CPAP to see if you need to add oxygen to the system Try using your xopenex more frequently to see if it impacts your breathing We will not perform any pulmonary function testing at this time. We may do this later.  Follow up with gastroenterology as planned.  Follow with Dr Lamonte Sakai in 3 months or sooner if you have any problems.

## 2014-05-09 ENCOUNTER — Other Ambulatory Visit: Payer: Self-pay | Admitting: Adult Health

## 2014-05-13 ENCOUNTER — Telehealth (HOSPITAL_COMMUNITY): Payer: Self-pay | Admitting: Vascular Surgery

## 2014-05-13 NOTE — Telephone Encounter (Signed)
Spoke w/pt and her daughter, pt is more SOB and has edema in feet per Dr Haroldine Laws she is going to take a metolazone tonight along with an extra 20 meq ok KCL if not improving she will call us back

## 2014-05-13 NOTE — Telephone Encounter (Signed)
Pt gained 3 lbs over night , feet swelling. Nurse states she is taking her medication..  Fluid retention problems per Nurse Laney Pastor.. Please advise

## 2014-05-14 ENCOUNTER — Telehealth (HOSPITAL_COMMUNITY): Payer: Self-pay | Admitting: Vascular Surgery

## 2014-05-14 NOTE — Telephone Encounter (Signed)
Pt Care manager from [facility] called pt legs are still swelling and still SOB.Marland Kitchen PLEASE ADVISE

## 2014-05-14 NOTE — Telephone Encounter (Signed)
Spoke w/pt she states swelling and SOB are slightly better but still present, she has a hard time seeing her scale to know what she weighs, advised pt to take metolazone again today and keep feet elevated, watch salt and fluid intake, she is agreeable will let us know if not better

## 2014-05-15 ENCOUNTER — Ambulatory Visit (INDEPENDENT_AMBULATORY_CARE_PROVIDER_SITE_OTHER): Payer: Commercial Managed Care - HMO | Admitting: Ophthalmology

## 2014-05-25 ENCOUNTER — Encounter (HOSPITAL_COMMUNITY): Payer: Self-pay | Admitting: General Practice

## 2014-05-25 ENCOUNTER — Ambulatory Visit (HOSPITAL_BASED_OUTPATIENT_CLINIC_OR_DEPARTMENT_OTHER)
Admission: RE | Admit: 2014-05-25 | Discharge: 2014-05-25 | Disposition: A | Discharge status: Home / Self Care | Payer: Medicare HMO | Source: Ambulatory Visit | Attending: Internal Medicine | Admitting: Internal Medicine

## 2014-05-25 ENCOUNTER — Encounter (HOSPITAL_COMMUNITY): Payer: Self-pay

## 2014-05-25 ENCOUNTER — Inpatient Hospital Stay (HOSPITAL_COMMUNITY)
Admission: AD | Admit: 2014-05-25 | Discharge: 2014-06-03 | DRG: 309 | Disposition: A | Discharge status: Home Health | Payer: Medicare HMO | Source: Ambulatory Visit | Attending: Internal Medicine | Admitting: Internal Medicine

## 2014-05-25 VITALS — BP 121/74 | HR 33 | Resp 20 | Wt 225.0 lb

## 2014-05-25 DIAGNOSIS — Z9861 Coronary angioplasty status: Secondary | ICD-10-CM

## 2014-05-25 DIAGNOSIS — Z87891 Personal history of nicotine dependence: Secondary | ICD-10-CM

## 2014-05-25 DIAGNOSIS — N179 Acute kidney failure, unspecified: Secondary | ICD-10-CM | POA: Diagnosis not present

## 2014-05-25 DIAGNOSIS — Z7901 Long term (current) use of anticoagulants: Secondary | ICD-10-CM

## 2014-05-25 DIAGNOSIS — R42 Dizziness and giddiness: Secondary | ICD-10-CM | POA: Diagnosis not present

## 2014-05-25 DIAGNOSIS — I498 Other specified cardiac arrhythmias: Secondary | ICD-10-CM

## 2014-05-25 DIAGNOSIS — G4733 Obstructive sleep apnea (adult) (pediatric): Secondary | ICD-10-CM

## 2014-05-25 DIAGNOSIS — I5032 Chronic diastolic (congestive) heart failure: Secondary | ICD-10-CM | POA: Diagnosis not present

## 2014-05-25 DIAGNOSIS — R110 Nausea: Secondary | ICD-10-CM | POA: Diagnosis not present

## 2014-05-25 DIAGNOSIS — I480 Paroxysmal atrial fibrillation: Secondary | ICD-10-CM

## 2014-05-25 DIAGNOSIS — Z6838 Body mass index (BMI) 38.0-38.9, adult: Secondary | ICD-10-CM

## 2014-05-25 DIAGNOSIS — Z881 Allergy status to other antibiotic agents status: Secondary | ICD-10-CM

## 2014-05-25 DIAGNOSIS — Z885 Allergy status to narcotic agent status: Secondary | ICD-10-CM

## 2014-05-25 DIAGNOSIS — E1121 Type 2 diabetes mellitus with diabetic nephropathy: Secondary | ICD-10-CM

## 2014-05-25 DIAGNOSIS — I2510 Atherosclerotic heart disease of native coronary artery without angina pectoris: Secondary | ICD-10-CM | POA: Diagnosis present

## 2014-05-25 DIAGNOSIS — N184 Chronic kidney disease, stage 4 (severe): Secondary | ICD-10-CM | POA: Diagnosis not present

## 2014-05-25 DIAGNOSIS — I4891 Unspecified atrial fibrillation: Secondary | ICD-10-CM | POA: Diagnosis present

## 2014-05-25 DIAGNOSIS — R001 Bradycardia, unspecified: Secondary | ICD-10-CM

## 2014-05-25 DIAGNOSIS — E1129 Type 2 diabetes mellitus with other diabetic kidney complication: Secondary | ICD-10-CM

## 2014-05-25 DIAGNOSIS — I252 Old myocardial infarction: Secondary | ICD-10-CM

## 2014-05-25 DIAGNOSIS — I739 Peripheral vascular disease, unspecified: Secondary | ICD-10-CM | POA: Diagnosis present

## 2014-05-25 DIAGNOSIS — I495 Sick sinus syndrome: Secondary | ICD-10-CM | POA: Diagnosis not present

## 2014-05-25 DIAGNOSIS — Z79899 Other long term (current) drug therapy: Secondary | ICD-10-CM

## 2014-05-25 DIAGNOSIS — I4819 Other persistent atrial fibrillation: Secondary | ICD-10-CM

## 2014-05-25 DIAGNOSIS — E669 Obesity, unspecified: Secondary | ICD-10-CM | POA: Diagnosis present

## 2014-05-25 DIAGNOSIS — Z880 Allergy status to penicillin: Secondary | ICD-10-CM

## 2014-05-25 DIAGNOSIS — I4892 Unspecified atrial flutter: Secondary | ICD-10-CM | POA: Diagnosis present

## 2014-05-25 DIAGNOSIS — I130 Hypertensive heart and chronic kidney disease with heart failure and stage 1 through stage 4 chronic kidney disease, or unspecified chronic kidney disease: Secondary | ICD-10-CM | POA: Diagnosis present

## 2014-05-25 DIAGNOSIS — Z8249 Family history of ischemic heart disease and other diseases of the circulatory system: Secondary | ICD-10-CM

## 2014-05-25 DIAGNOSIS — K219 Gastro-esophageal reflux disease without esophagitis: Secondary | ICD-10-CM | POA: Diagnosis present

## 2014-05-25 DIAGNOSIS — I482 Chronic atrial fibrillation, unspecified: Secondary | ICD-10-CM

## 2014-05-25 DIAGNOSIS — N189 Chronic kidney disease, unspecified: Secondary | ICD-10-CM

## 2014-05-25 DIAGNOSIS — E785 Hyperlipidemia, unspecified: Secondary | ICD-10-CM | POA: Diagnosis present

## 2014-05-25 DIAGNOSIS — R51 Headache: Secondary | ICD-10-CM | POA: Diagnosis not present

## 2014-05-25 DIAGNOSIS — K2270 Barrett's esophagus without dysplasia: Secondary | ICD-10-CM | POA: Diagnosis present

## 2014-05-25 DIAGNOSIS — Z9109 Other allergy status, other than to drugs and biological substances: Secondary | ICD-10-CM

## 2014-05-25 DIAGNOSIS — I509 Heart failure, unspecified: Secondary | ICD-10-CM

## 2014-05-25 DIAGNOSIS — Z794 Long term (current) use of insulin: Secondary | ICD-10-CM

## 2014-05-25 DIAGNOSIS — Z96659 Presence of unspecified artificial knee joint: Secondary | ICD-10-CM

## 2014-05-25 DIAGNOSIS — I10 Essential (primary) hypertension: Secondary | ICD-10-CM

## 2014-05-25 DIAGNOSIS — N058 Unspecified nephritic syndrome with other morphologic changes: Secondary | ICD-10-CM

## 2014-05-25 DIAGNOSIS — J45909 Unspecified asthma, uncomplicated: Secondary | ICD-10-CM | POA: Diagnosis present

## 2014-05-25 DIAGNOSIS — E876 Hypokalemia: Secondary | ICD-10-CM | POA: Diagnosis present

## 2014-05-25 DIAGNOSIS — E119 Type 2 diabetes mellitus without complications: Secondary | ICD-10-CM | POA: Diagnosis present

## 2014-05-25 HISTORY — DX: Adverse effect of unspecified anesthetic, initial encounter: T41.45XA

## 2014-05-25 HISTORY — DX: Other persistent atrial fibrillation: I48.19

## 2014-05-25 HISTORY — DX: Other complications of anesthesia, initial encounter: T88.59XA

## 2014-05-25 LAB — CBC WITH DIFFERENTIAL/PLATELET
Basophils Absolute: 0 10*3/uL (ref 0.0–0.1)
Basophils Relative: 0 % (ref 0–1)
Eosinophils Absolute: 0.4 10*3/uL (ref 0.0–0.7)
Eosinophils Relative: 5 % (ref 0–5)
HCT: 39.7 % (ref 36.0–46.0)
Hemoglobin: 12.9 g/dL (ref 12.0–15.0)
Lymphocytes Relative: 37 % (ref 12–46)
Lymphs Abs: 3.2 10*3/uL (ref 0.7–4.0)
MCH: 29.6 pg (ref 26.0–34.0)
MCHC: 32.5 g/dL (ref 30.0–36.0)
MCV: 91.1 fL (ref 78.0–100.0)
Monocytes Absolute: 0.9 10*3/uL (ref 0.1–1.0)
Monocytes Relative: 10 % (ref 3–12)
Neutro Abs: 4.2 10*3/uL (ref 1.7–7.7)
Neutrophils Relative %: 48 % (ref 43–77)
Platelets: 260 10*3/uL (ref 150–400)
RBC: 4.36 MIL/uL (ref 3.87–5.11)
RDW: 16 % — ABNORMAL HIGH (ref 11.5–15.5)
WBC: 8.7 10*3/uL (ref 4.0–10.5)

## 2014-05-25 LAB — COMPREHENSIVE METABOLIC PANEL
ALT: 17 U/L (ref 0–35)
AST: 19 U/L (ref 0–37)
Albumin: 3.5 g/dL (ref 3.5–5.2)
Alkaline Phosphatase: 89 U/L (ref 39–117)
Anion gap: 16 — ABNORMAL HIGH (ref 5–15)
BUN: 59 mg/dL — ABNORMAL HIGH (ref 6–23)
CO2: 27 mEq/L (ref 19–32)
Calcium: 9.7 mg/dL (ref 8.4–10.5)
Chloride: 97 mEq/L (ref 96–112)
Creatinine, Ser: 3.62 mg/dL — ABNORMAL HIGH (ref 0.50–1.10)
GFR calc Af Amer: 14 mL/min — ABNORMAL LOW (ref >90)
GFR calc non Af Amer: 12 mL/min — ABNORMAL LOW (ref >90)
Glucose, Bld: 95 mg/dL (ref 70–99)
Potassium: 3.1 mEq/L — ABNORMAL LOW (ref 3.7–5.3)
Sodium: 140 mEq/L (ref 137–147)
Total Bilirubin: 0.2 mg/dL — ABNORMAL LOW (ref 0.3–1.2)
Total Protein: 7.8 g/dL (ref 6.0–8.3)

## 2014-05-25 LAB — PRO B NATRIURETIC PEPTIDE: Pro B Natriuretic peptide (BNP): 2123 pg/mL — ABNORMAL HIGH (ref 0–125)

## 2014-05-25 LAB — GLUCOSE, CAPILLARY
Glucose-Capillary: 188 mg/dL — ABNORMAL HIGH (ref 70–99)
Glucose-Capillary: 239 mg/dL — ABNORMAL HIGH (ref 70–99)

## 2014-05-25 LAB — MAGNESIUM: Magnesium: 2.3 mg/dL (ref 1.5–2.5)

## 2014-05-25 MED ORDER — SODIUM CHLORIDE 0.9 % IJ SOLN
3.0000 mL | Freq: Two times a day (BID) | INTRAMUSCULAR | Status: DC
  Administered 2014-05-25 – 2014-06-03 (×17): 3 mL via INTRAVENOUS

## 2014-05-25 MED ORDER — METHOCARBAMOL 750 MG PO TABS
750.0000 mg | ORAL_TABLET | Freq: Three times a day (TID) | ORAL | Status: DC | PRN
  Filled 2014-05-25: qty 1

## 2014-05-25 MED ORDER — GLUCAGON HCL RDNA (DIAGNOSTIC) 1 MG IJ SOLR
3.0000 mg/h | INTRAVENOUS | Status: AC
  Administered 2014-05-25 – 2014-05-26 (×2): 5 mg/h via INTRAVENOUS
  Filled 2014-05-25 (×2): qty 10

## 2014-05-25 MED ORDER — INSULIN ASPART 100 UNIT/ML ~~LOC~~ SOLN: 0.0000 [IU] | Freq: Three times a day (TID) | SUBCUTANEOUS | Status: DC

## 2014-05-25 MED ORDER — SODIUM CHLORIDE 0.9 % IJ SOLN: 3.0000 mL | INTRAMUSCULAR | Status: DC | PRN

## 2014-05-25 MED ORDER — ATORVASTATIN CALCIUM 40 MG PO TABS
40.0000 mg | ORAL_TABLET | Freq: Every day | ORAL | Status: DC
  Administered 2014-05-25 – 2014-06-02 (×9): 40 mg via ORAL
  Filled 2014-05-25 (×11): qty 1

## 2014-05-25 MED ORDER — GLUCAGON HCL RDNA (DIAGNOSTIC) 1 MG IJ SOLR
3.0000 mg/h | INTRAVENOUS | Status: DC
  Filled 2014-05-25: qty 5

## 2014-05-25 MED ORDER — INSULIN GLARGINE 100 UNIT/ML ~~LOC~~ SOLN
40.0000 [IU] | Freq: Every day | SUBCUTANEOUS | Status: DC
  Administered 2014-05-26 – 2014-06-03 (×9): 40 [IU] via SUBCUTANEOUS
  Filled 2014-05-25 (×9): qty 0.4

## 2014-05-25 MED ORDER — TORSEMIDE 20 MG PO TABS: 40.0000 mg | ORAL_TABLET | Freq: Every day | ORAL | Status: DC

## 2014-05-25 MED ORDER — GLUCAGON HCL RDNA (DIAGNOSTIC) 1 MG IJ SOLR
3.0000 mg/h | INTRAVENOUS | Status: AC
  Administered 2014-05-25: 5 mg/h via INTRAVENOUS
  Filled 2014-05-25: qty 5

## 2014-05-25 MED ORDER — POTASSIUM CHLORIDE CRYS ER 20 MEQ PO TBCR: 20.0000 meq | EXTENDED_RELEASE_TABLET | Freq: Two times a day (BID) | ORAL | Status: DC

## 2014-05-25 MED ORDER — ONDANSETRON HCL 4 MG/2ML IJ SOLN
4.0000 mg | Freq: Four times a day (QID) | INTRAMUSCULAR | Status: DC | PRN
  Administered 2014-05-25 – 2014-05-26 (×2): 4 mg via INTRAVENOUS
  Filled 2014-05-25 (×2): qty 2

## 2014-05-25 MED ORDER — POTASSIUM CHLORIDE CRYS ER 20 MEQ PO TBCR
20.0000 meq | EXTENDED_RELEASE_TABLET | Freq: Once | ORAL | Status: AC
  Administered 2014-05-25: 20 meq via ORAL
  Filled 2014-05-25: qty 1

## 2014-05-25 MED ORDER — AMITRIPTYLINE HCL 25 MG PO TABS
25.0000 mg | ORAL_TABLET | Freq: Every day | ORAL | Status: DC
  Administered 2014-05-25 – 2014-06-02 (×9): 25 mg via ORAL
  Filled 2014-05-25 (×12): qty 1

## 2014-05-25 MED ORDER — OXYCODONE-ACETAMINOPHEN 5-325 MG PO TABS
1.0000 | ORAL_TABLET | Freq: Four times a day (QID) | ORAL | Status: DC | PRN
  Administered 2014-05-26 – 2014-06-02 (×3): 1 via ORAL
  Filled 2014-05-25 (×14): qty 1

## 2014-05-25 MED ORDER — SODIUM CHLORIDE 0.9 % IV SOLN: 250.0000 mL | INTRAVENOUS | Status: DC | PRN

## 2014-05-25 MED ORDER — HYDRALAZINE HCL 20 MG/ML IJ SOLN
10.0000 mg | INTRAMUSCULAR | Status: DC | PRN
Start: 2014-05-25 — End: 2014-06-03

## 2014-05-25 MED ORDER — GLUCAGON HCL RDNA (DIAGNOSTIC) 1 MG IJ SOLR
5.0000 mg | Freq: Once | INTRAVENOUS | Status: AC
  Administered 2014-05-25: 5 mg via INTRAVENOUS
  Filled 2014-05-25: qty 5

## 2014-05-25 MED ORDER — PANTOPRAZOLE SODIUM 40 MG PO TBEC
40.0000 mg | DELAYED_RELEASE_TABLET | Freq: Every day | ORAL | Status: DC
  Administered 2014-05-25 – 2014-06-03 (×10): 40 mg via ORAL
  Filled 2014-05-25 (×10): qty 1

## 2014-05-25 MED ORDER — CLONIDINE HCL 0.2 MG PO TABS: 0.2000 mg | ORAL_TABLET | Freq: Three times a day (TID) | ORAL | Status: DC

## 2014-05-25 MED ORDER — ACETAMINOPHEN 325 MG PO TABS
650.0000 mg | ORAL_TABLET | ORAL | Status: DC | PRN
  Administered 2014-05-26: 325 mg via ORAL
  Administered 2014-05-28 – 2014-05-29 (×2): 650 mg via ORAL
  Filled 2014-05-25 (×3): qty 2

## 2014-05-25 MED ORDER — FEBUXOSTAT 40 MG PO TABS
40.0000 mg | ORAL_TABLET | Freq: Every day | ORAL | Status: DC
  Administered 2014-05-26 – 2014-06-03 (×9): 40 mg via ORAL
  Filled 2014-05-25 (×9): qty 1

## 2014-05-25 MED ORDER — ATROPINE SULFATE 0.1 MG/ML IJ SOLN
INTRAMUSCULAR | Status: AC
  Filled 2014-05-25: qty 10

## 2014-05-25 MED ORDER — CLONIDINE HCL 0.2 MG PO TABS
0.2000 mg | ORAL_TABLET | Freq: Three times a day (TID) | ORAL | Status: DC
  Administered 2014-05-25: 0.2 mg via ORAL
  Filled 2014-05-25 (×2): qty 1

## 2014-05-25 MED ORDER — APIXABAN 5 MG PO TABS
5.0000 mg | ORAL_TABLET | Freq: Two times a day (BID) | ORAL | Status: AC
  Administered 2014-05-25 – 2014-05-26 (×3): 5 mg via ORAL
  Filled 2014-05-25 (×3): qty 1

## 2014-05-25 MED ORDER — INSULIN ASPART 100 UNIT/ML ~~LOC~~ SOLN
0.0000 [IU] | Freq: Three times a day (TID) | SUBCUTANEOUS | Status: DC
  Administered 2014-05-25: 5 [IU] via SUBCUTANEOUS
  Administered 2014-05-26: 2 [IU] via SUBCUTANEOUS
  Administered 2014-05-26: 5 [IU] via SUBCUTANEOUS
  Administered 2014-05-26: 3 [IU] via SUBCUTANEOUS
  Administered 2014-05-26 – 2014-05-27 (×2): 5 [IU] via SUBCUTANEOUS
  Administered 2014-05-27: 2 [IU] via SUBCUTANEOUS
  Administered 2014-05-28 – 2014-05-29 (×3): 3 [IU] via SUBCUTANEOUS
  Administered 2014-05-29 – 2014-05-30 (×4): 5 [IU] via SUBCUTANEOUS
  Administered 2014-05-30 (×3): 3 [IU] via SUBCUTANEOUS
  Administered 2014-05-31: 5 [IU] via SUBCUTANEOUS
  Administered 2014-05-31: 3 [IU] via SUBCUTANEOUS
  Administered 2014-05-31: 2 [IU] via SUBCUTANEOUS
  Administered 2014-05-31 – 2014-06-01 (×2): 5 [IU] via SUBCUTANEOUS
  Administered 2014-06-01: 3 [IU] via SUBCUTANEOUS
  Administered 2014-06-01: 5 [IU] via SUBCUTANEOUS
  Administered 2014-06-01 – 2014-06-02 (×2): 3 [IU] via SUBCUTANEOUS
  Administered 2014-06-02: 2 [IU] via SUBCUTANEOUS
  Administered 2014-06-02 (×2): 8 [IU] via SUBCUTANEOUS
  Administered 2014-06-03: 3 [IU] via SUBCUTANEOUS

## 2014-05-25 MED ORDER — AMIODARONE HCL 200 MG PO TABS: 200.0000 mg | ORAL_TABLET | Freq: Every day | ORAL | Status: DC

## 2014-05-25 NOTE — Progress Notes (Addendum)
Patient ID: [name], female   DOB: [birth year], 68 y.o.   MRN: [id number]  Primary Cardiologist: Dr. Einar Gip PCP: Dr. [name] Nephrologist: Dr. Florene Glen  HPI: Ms. Bendell is a 68 year old woman with hypertension, hyperlipidemia, bronchial asthma, diabetes mellitus, CKD stage IV (baseline cr ~2.5), atrial fibrillation and diastolic HF.  She was admitted in 4/15 for R TKR. That hospitalization complicated by a/c diastolic HF with respiratory distress requiring non-rebreather support. Weight on discharge was 226 pounds. ABG at that time 7.4/34/91/97%. Cr peaked at 2.9  Admitted 6/30-7/10/15 for SOB and CP following scheduled cardioversion. Found to have severe aspiration PNA and was intubated. She maintained SR for short period of time and then went back into Afib/Aflutter.  HR controlled on amio and diltiazem. Discharged to [facility] at a weight of 232 lbs.   Follow up for Heart Failure: Since last visit has had increase in weight and has needed metolazone 2.5 mg twice with extra potassium. Reports over the past week that she has been dizzy and fatigued. + SOB with ambulation. Still did not bring medications with her to appointment. Weight at home 225-230 lbs. Denies CP, orthopnea or PND. Following a low salt diet and drinking less than 2L a day.    Labs: 03/13/14: K+ 3.4, creatinine 2.10, BUN 16 04/02/14: K 5.6, creatinine 2.66, BUN 29 04/13/14: K 4.0, creatinine 2.66, K 5.6  ROS: All systems negative except as listed in HPI, PMH and Problem List.  SH:  History   Social History  . Marital Status: Widowed    Spouse Name: N/A    Number of Children: 3  . Years of Education: N/A   Occupational History  . Not on file.   Social History Main Topics  . Smoking status: Never Smoker   . Smokeless tobacco: Never Used  . Alcohol Use: No  . Drug Use: No  . Sexual Activity: Not Currently   Other Topics Concern  . Not on file   Social History Narrative  . No narrative on file     FH:  Family History  Problem Relation Age of Onset  . Heart disease Mother   . Cancer Mother     bladder  . Kidney disease Mother   . Ovarian cancer Daughter   . Stomach cancer Maternal Uncle   . Colon cancer Maternal Aunt   . Esophageal cancer Neg Hx     Past Medical History  Diagnosis Date  . Atrial flutter   . Hypertension   . Diabetes mellitus   . Diastolic heart failure     a. EF 60-65%, RV nl (03/2014)  . Asthma   . Hyperlipidemia   . Fatty liver   . Esophageal dysmotility   . Arthritis   . Sleep apnea     wears CPAP  . Fatty tumor fatty tumor back  . Coronary atherosclerosis of native coronary artery   . Morbid obesity   . Myocardial infarction 2009  . Dysrhythmia     afib,flutter hx  . Heart murmur   . Peripheral vascular disease   . Pneumonia     hx  . GERD (gastroesophageal reflux disease)     barrets esophagus  . Anginal pain     occ; non-ischemic Lexiscan 09/2012  . Kidney disease     CKD stage IV (Dr. Erling Cruz)    Current Outpatient Prescriptions  Medication Sig Dispense Refill  . acetaminophen (TYLENOL) 500 MG tablet Take 500 mg by mouth every 6 (six) hours  as needed for mild pain.      Marland Kitchen amiodarone (PACERONE) 200 MG tablet Take 1 tablet (200 mg total) by mouth daily.  60 tablet  2  . amitriptyline (ELAVIL) 25 MG tablet Take 25 mg by mouth at bedtime.        Marland Kitchen apixaban (ELIQUIS) 5 MG TABS tablet Take 1 tablet (5 mg total) by mouth 2 (two) times daily.  60 tablet  2  . atorvastatin (LIPITOR) 40 MG tablet Take 1 tablet (40 mg total) by mouth daily.  90 tablet  3  . cloNIDine (CATAPRES) 0.1 MG tablet Take 2 tablets (0.2 mg total) by mouth 3 (three) times daily.  180 tablet  3  . dicyclomine (BENTYL) 10 MG capsule Take 10 mg by mouth 4 (four) times daily as needed (IBS).      Marland Kitchen diltiazem (CARDIZEM CD) 240 MG 24 hr capsule Take 1 capsule (240 mg total) by mouth daily.  30 capsule  0  . esomeprazole (NEXIUM) 40 MG capsule Take 1 capsule (40 mg  total) by mouth daily at 12 noon.  30 capsule  0  . febuxostat (ULORIC) 40 MG tablet Take 40 mg by mouth daily.       . insulin glargine (LANTUS) 100 UNIT/ML injection Inject 0.4 mLs (40 Units total) into the skin daily.  10 mL  11  . insulin lispro (HUMALOG) 100 UNIT/ML injection Inject 0.2 mLs (20 Units total) into the skin 3 (three) times daily after meals.  10 mL  11  . Linaclotide (LINZESS) 145 MCG CAPS capsule Take 290 mcg by mouth daily.      . methocarbamol (ROBAXIN-750) 750 MG tablet Take 1 tablet (750 mg total) by mouth every 8 (eight) hours as needed for muscle spasms.  40 tablet  0  . metoprolol (LOPRESSOR) 50 MG tablet Take 1.5 tablets (75 mg total) by mouth 2 (two) times daily.  90 tablet  2  . nitroGLYCERIN (NITROSTAT) 0.4 MG SL tablet Place 0.4 mg under the tongue every 5 (five) minutes as needed for chest pain.       Marland Kitchen oxyCODONE-acetaminophen (PERCOCET/ROXICET) 5-325 MG per tablet Take one tablet by mouth every 6 hours as needed for mild pain; Take two tablets by mouth every 6 hours as needed for moderate to severe pain  240 tablet  0  . potassium chloride SA (K-DUR,KLOR-CON) 20 MEQ tablet Take 1 tablet (20 mEq total) by mouth 2 (two) times daily.  75 tablet  6  . ranitidine (ZANTAC) 150 MG tablet Take 1 tablet (150 mg total) by mouth at bedtime.  30 tablet  0  . rosuvastatin (CRESTOR) 10 MG tablet Take 10 mg by mouth daily.        Marland Kitchen torsemide (DEMADEX) 20 MG tablet TAKE TWO TABLETS BY MOUTH TWICE DAILY  120 tablet  0  . Vitamin D, Ergocalciferol, (DRISDOL) 50000 UNITS CAPS capsule Take 50,000 Units by mouth every Monday.      Penne Lash HFA 45 MCG/ACT inhaler Inhale 2 puffs into the lungs every 4 (four) hours as needed for wheezing or shortness of breath.  1 Inhaler  1  . zolpidem (AMBIEN) 10 MG tablet Take 10 mg by mouth at bedtime as needed for sleep.        No current facility-administered medications for this encounter.    Filed Vitals:   05/25/14 1502  BP: 121/74  Pulse:  33  Resp: 20  Weight: 225 lb (102.059 kg)  SpO2: 98%  PHYSICAL EXAM: General: Obese, NAD, in wheelchair. Fatigued appearing, daughter present HEENT: normal  Neck: supple. JVP hard to see d/t body habitus but does not appear elevated; Carotids 2+ bilat; no bruits. No lymphadenopathy or thryomegaly appreciate Cor: PMI nonpalpbale. Distant. Irregular rate. No obvious murmur  Lungs:  Diminished in the bases Abdomen: Obese soft, nontender. Non-distended. No hepatosplenomegaly. No bruits or masses. Good bowel sounds.  Extremities: no cyanosis, clubbing, rash, warm. Trace edema Neuro: alert & orientedx3, cranial nerves grossly intact. moves all 4 extremities w/o difficulty. Affect pleasant   EKG: Junctional rhythm 32 bpm  ASSESSMENT & PLAN:  1) Chronic diastolic HF: EF 123456 (0000000) - NYHA III symptoms and volume status stable. Continue torsemide 40 mg daily. - SBP stable will continue clonidine 0.2 mg BID and would eventually like to wean her off. - Reinforced the need and importance of daily weights, a low sodium diet, and fluid restriction (less than 2 L a day). Instructed to call the HF clinic if weight increases more than 3 lbs overnight or 5 lbs in a week.  2) Afib/Aflutter - She is no longer in atrial flutter and now is having a junctional rhythm with rate 32 bpm. Will stop all AV node blocking medications (cardizem and metoprolol). Admit to the hospital for observation. Continue amiodarone 200 mg daily.  - Continue Eliquis 5 mg BID.  3) CKD stage IV - Baseline Cr 2.1-2.4. Check BMET 4) HTN - Stable.   Admit for observation  Rande Brunt NP-C 3:34 PM

## 2014-05-25 NOTE — H&P (Signed)
Advanced Heart Failure Team Consult Note  Primary Cardiologist: Dr. Einar Gip  PCP: Dr. [name]  Nephrologist: Dr. Florene Glen    Reason for Admission: Junctional Bradycardia  HPI:    [name] is a 68 year old woman with hypertension, hyperlipidemia, bronchial asthma, diabetes mellitus, CKD stage IV (baseline cr ~2.5), atrial fibrillation and diastolic HF.  Admitted 6/30-7/10/15 for SOB and CP following scheduled cardioversion. Found to have severe aspiration PNA and was intubated. She maintained SR for short period of time and then went back into Afib/Aflutter. HR controlled on amio and diltiazem. Discharged to [facility] at a weight of 232 lbs.  Has been in atrial fibrillation and have been trying to get her stable enough for DC-CV.    Seen in HF Clinic today.  Has been dizzy and fatigued. + SOB with ambulation. Found to have junctional rhythm with HR in low 30s. No syncope. Weight stable.   Review of Systems: [y] = yes, [ ]  = no   General: Weight gain [ ] ; Weight loss [ ] ; Anorexia [ ] ; Fatigue [ y]; Fever [ ] ; Chills [ ] ; Weakness [ ] y  Cardiac: Chest pain/pressure [ ] ; Resting SOB [ ] ; Exertional SOB [ y]; Orthopnea [ ] ; Pedal Edema [ ] ; Palpitations [ ] ; Syncope [ ] ; Presyncope [ ] ; Paroxysmal nocturnal dyspnea[ ]   Pulmonary: Cough [ ] ; Wheezing[ ] ; Hemoptysis[ ] ; Sputum [ ] ; Snoring [ ]   GI: Vomiting[ ] ; Dysphagia[ ] ; Melena[ ] ; Hematochezia [ ] ; Heartburn[ ] ; Abdominal pain [ ] ; Constipation [ ] ; Diarrhea [ ] ; BRBPR [ ]   GU: Hematuria[ ] ; Dysuria [ ] ; Nocturia[ ]   Vascular: Pain in legs with walking [ ] ; Pain in feet with lying flat [ ] ; Non-healing sores [ ] ; Stroke [ ] ; TIA [ ] ; Slurred speech [ ] ;  Neuro: Headaches[ ] ; Vertigo[ ] ; Seizures[ ] ; Paresthesias[ ] ;Blurred vision [ ] ; Diplopia [ ] ; Vision changes [ ]   Ortho/Skin: Arthritis [ y]; Joint pain [ ] ; Muscle pain [ ] ; Joint swelling [ ] ; Back Pain [ ] ; Rash [ ]   Psych: Depression[ ] ; Anxiety[ ]   Heme: Bleeding problems  [ ] ; Clotting disorders [ ] ; Anemia [ ]   Endocrine: Diabetes Blue.Reese ]; Thyroid dysfunction[ ]   Home Medications Prior to Admission medications   Medication Sig Start Date End Date Taking? Authorizing Provider  acetaminophen (TYLENOL) 500 MG tablet Take 500 mg by mouth every 6 (six) hours as needed for mild pain.    Historical Provider, MD  amiodarone (PACERONE) 200 MG tablet Take 1 tablet (200 mg total) by mouth daily. 04/29/14   Jolaine Artist, MD  amitriptyline (ELAVIL) 25 MG tablet Take 25 mg by mouth at bedtime.      Historical Provider, MD  apixaban (ELIQUIS) 5 MG TABS tablet Take 1 tablet (5 mg total) by mouth 2 (two) times daily. 04/29/14   Jolaine Artist, MD  atorvastatin (LIPITOR) 40 MG tablet Take 1 tablet (40 mg total) by mouth daily. 04/29/14   Jolaine Artist, MD  cloNIDine (CATAPRES) 0.1 MG tablet Take 2 tablets (0.2 mg total) by mouth 3 (three) times daily. 04/22/14   Rande Brunt, NP  dicyclomine (BENTYL) 10 MG capsule Take 10 mg by mouth 4 (four) times daily as needed (IBS).    Historical Provider, MD  esomeprazole (NEXIUM) 40 MG capsule Take 1 capsule (40 mg total) by mouth daily at 12 noon. 04/29/14   Jolaine Artist, MD  febuxostat (ULORIC) 40 MG tablet Take 40 mg by mouth  daily.     Historical Provider, MD  insulin glargine (LANTUS) 100 UNIT/ML injection Inject 0.4 mLs (40 Units total) into the skin daily. 12/23/13   Delfina Redwood, MD  insulin lispro (HUMALOG) 100 UNIT/ML injection Inject 0.2 mLs (20 Units total) into the skin 3 (three) times daily after meals. 12/23/13   Delfina Redwood, MD  Linaclotide Rolan Lipa) 145 MCG CAPS capsule Take 290 mcg by mouth daily.    Historical Provider, MD  methocarbamol (ROBAXIN-750) 750 MG tablet Take 1 tablet (750 mg total) by mouth every 8 (eight) hours as needed for muscle spasms. 12/23/13   Erlene Senters, PA-C  nitroGLYCERIN (NITROSTAT) 0.4 MG SL tablet Place 0.4 mg under the tongue every 5 (five) minutes as needed for  chest pain.     Historical Provider, MD  oxyCODONE-acetaminophen (PERCOCET/ROXICET) 5-325 MG per tablet Take one tablet by mouth every 6 hours as needed for mild pain; Take two tablets by mouth every 6 hours as needed for moderate to severe pain 03/16/14   Tiffany L Reed, DO  potassium chloride SA (K-DUR,KLOR-CON) 20 MEQ tablet Take 1 tablet (20 mEq total) by mouth 2 (two) times daily. 04/02/14   Jolaine Artist, MD  ranitidine (ZANTAC) 150 MG tablet Take 1 tablet (150 mg total) by mouth at bedtime. 04/29/14   Jolaine Artist, MD  rosuvastatin (CRESTOR) 10 MG tablet Take 10 mg by mouth daily.      Historical Provider, MD  torsemide (DEMADEX) 20 MG tablet TAKE TWO TABLETS BY MOUTH TWICE DAILY 05/12/14   Blanchie Serve, MD  Vitamin D, Ergocalciferol, (DRISDOL) 50000 UNITS CAPS capsule Take 50,000 Units by mouth every Monday.    Historical Provider, MD  XOPENEX HFA 45 MCG/ACT inhaler Inhale 2 puffs into the lungs every 4 (four) hours as needed for wheezing or shortness of breath. 03/11/14   Marijean Heath, NP  zolpidem (AMBIEN) 10 MG tablet Take 10 mg by mouth at bedtime as needed for sleep.     Historical Provider, MD    Past Medical History: Past Medical History  Diagnosis Date  . Atrial flutter   . Hypertension   . Diabetes mellitus   . Diastolic heart failure     a. EF 60-65%, RV nl (03/2014)  . Asthma   . Hyperlipidemia   . Fatty liver   . Esophageal dysmotility   . Arthritis   . Sleep apnea     wears CPAP  . Fatty tumor fatty tumor back  . Coronary atherosclerosis of native coronary artery   . Morbid obesity   . Myocardial infarction 2009  . Dysrhythmia     afib,flutter hx  . Heart murmur   . Peripheral vascular disease   . Pneumonia     hx  . GERD (gastroesophageal reflux disease)     barrets esophagus  . Anginal pain     occ; non-ischemic Lexiscan 09/2012  . Kidney disease     CKD stage IV (Dr. Erling Cruz)    Past Surgical History: Past Surgical History   Procedure Laterality Date  . Coronary angioplasty with stent placement    . Breast lumpectomy      right  . Tubal ligation    . Tonsillectomy    . Total knee arthroplasty Right 12/15/2013    Procedure: RIGHT TOTAL KNEE ARTHROPLASTY;  Surgeon: Alta Corning, MD;  Location: [facility];  Service: Orthopedics;  Laterality: Right;  . Cardioversion N/A 03/03/2014    Procedure: CARDIOVERSION;  Surgeon:  Laverda Page, MD;  Location: [facility] ENDOSCOPY;  Service: Cardiovascular;  Laterality: N/A;    Family History: Family History  Problem Relation Age of Onset  . Heart disease Mother   . Cancer Mother     bladder  . Kidney disease Mother   . Ovarian cancer Daughter   . Stomach cancer Maternal Uncle   . Colon cancer Maternal Aunt   . Esophageal cancer Neg Hx     Social History: History   Social History  . Marital Status: Widowed    Spouse Name: N/A    Number of Children: 3  . Years of Education: N/A   Social History Main Topics  . Smoking status: Never Smoker   . Smokeless tobacco: Never Used  . Alcohol Use: No  . Drug Use: No  . Sexual Activity: Not Currently   Other Topics Concern  . Not on file   Social History Narrative  . No narrative on file    Allergies:  Allergies  Allergen Reactions  . Codeine Nausea And Vomiting  . Penicillins Nausea And Vomiting  . Sulfa Antibiotics Itching and Nausea And Vomiting    "everything I seen was red"  . Other Itching    Adhesive from ekg leads    Objective:    Vital Signs:   Pulse Rate:  [33] 33 (09/21 1502) Resp:  [20] 20 (09/21 1502) BP: (121)/(74) 121/74 mmHg (09/21 1502) SpO2:  [98 %] 98 % (09/21 1502) Weight:  [225 lb (102.059 kg)] 225 lb (102.059 kg) (09/21 1502)    Weight change: There were no vitals filed for this visit.  Intake/Output:  No intake or output data in the 24 hours ending 05/25/14 1607   Physical Exam: General: Obese, NAD, in wheelchair. Fatigued appearing, daughter present  HEENT: normal  Neck:  supple. JVP hard to see d/t body habitus but does not appear elevated; Carotids 2+ bilat; no bruits. No lymphadenopathy or thryomegaly appreciate  Cor: PMI nonpalpbale. Distant. Loletha Grayer regular. No obvious murmur  Lungs: Diminished in the bases  Abdomen: Obese soft, nontender. Non-distended. No hepatosplenomegaly. No bruits or masses. Good bowel sounds.  Extremities: no cyanosis, clubbing, rash, warm. Trace edema  Neuro: alert & orientedx3, cranial nerves grossly intact. moves all 4 extremities w/o difficulty. Affect     Labs: Basic Metabolic Panel: No results found for this basename: NA, K, CL, CO2, GLUCOSE, BUN, CREATININE, CALCIUM, MG, PHOS,  in the last 168 hours  Liver Function Tests: No results found for this basename: AST, ALT, ALKPHOS, BILITOT, PROT, ALBUMIN,  in the last 168 hours No results found for this basename: LIPASE, AMYLASE,  in the last 168 hours No results found for this basename: AMMONIA,  in the last 168 hours  CBC: No results found for this basename: WBC, NEUTROABS, HGB, HCT, MCV, PLT,  in the last 168 hours  Cardiac Enzymes: No results found for this basename: CKTOTAL, CKMB, CKMBINDEX, TROPONINI,  in the last 168 hours  BNP: BNP (last 3 results)  Recent Labs  03/03/14 1851  PROBNP 1531.0*    CBG: No results found for this basename: GLUCAP,  in the last 168 hours  Coagulation Studies: No results found for this basename: LABPROT, INR,  in the last 72 hours  Other results: [id number] bradycardia 32 with background extreme sinus bradycardia and AV dissociation (versus retrograde ps)  Imaging:  No results found.   Medications:     Current Medications:    Infusions:     Assessment:  1) Symptomatic junctional bradycardia  2) Chronic diastolic HF: EF 123456 (0000000)  3) Afib/Aflutter - resolved 4) CKD stage IV  5) HTN  6) DM2   Plan/Discussion:    She has converted from atrial flutter and now is having a symptomatic junctional  bradycardia. Will bring in for observation on tele. Stop all AV node blocking medications (cardizem and metoprolol). Will continue amiodarone 200 mg daily. Continue Eliquis 5 mg BID. Check electrolytes.  Length of Stay:   Rande Brunt 05/25/2014, 4:07 PM  Advanced Heart Failure Team Pager [phone] (M-F; 7a - 4p)  Please contact [facility] for night-coverage after hours (4p -7a ) and weekends on [email or web address]  Patient seen and examined with [name], NP. We discussed all aspects of the encounter. I agree with the assessment and plan as stated above. She has symptomatic bradycardia with a junctional rhythm on ECG.  Will bring in for observation. Hold all AVN agents. Can also hold clonidine if needed. Cover DM2 with SSI.   Benay Spice 6:36 PM

## 2014-05-25 NOTE — Progress Notes (Signed)
Izora Gala, RN (admission nurse) made aware of patients admission.

## 2014-05-25 NOTE — Progress Notes (Signed)
Patient states she is diabetic, however there is not an order to check CBG's. Ali from [facility] who is following patient made aware. [name] states she has not completed placing orders for patient and will address.

## 2014-05-25 NOTE — Progress Notes (Addendum)
Patient transferred to room 2H04. Belongings sent with patient.  Report given to Naida Sleight, [place]

## 2014-05-25 NOTE — Progress Notes (Addendum)
Patient has arrived to room 3E13 from [facility]. Patient placed on 2L O2 via nasal cannula (patient states she wears O2 at home. Patient has no complaints at this time, denies pain. Patient denies dizziness. Patient alert and oriented x4. Patient oriented to unit and equipment in room. Patient placed on cardiac monitor.  Will monitor.

## 2014-05-25 NOTE — Progress Notes (Signed)
eLink Physician-Brief Progress Note Patient Name: [name] DOB: 1946-02-16 MRN: [id number]   Date of Service  05/25/2014  HPI/Events of Note    eICU Interventions  Database and care plan reviewed. PCCM available to assist as needed     Intervention Category Evaluation Type: New Patient Evaluation  Merton Border 05/25/2014, 8:05 PM

## 2014-05-25 NOTE — Progress Notes (Signed)
   HR now in 27-29.  SBP 100-110.  Will move to ICU for closer monitoring. Treat with glucagon.   If HR not improving can place central access for dopamine +/- TVP.   Benay Spice 7:29 PM

## 2014-05-25 NOTE — Progress Notes (Addendum)
CCMD notified of patients admission into room 3E13. Tried calling Susie, RN (Admission Nurse) however phone line currently busy at this time. Will continue to try in get in touch.

## 2014-05-26 DIAGNOSIS — I509 Heart failure, unspecified: Secondary | ICD-10-CM | POA: Diagnosis present

## 2014-05-26 DIAGNOSIS — G4733 Obstructive sleep apnea (adult) (pediatric): Secondary | ICD-10-CM | POA: Diagnosis present

## 2014-05-26 DIAGNOSIS — I739 Peripheral vascular disease, unspecified: Secondary | ICD-10-CM | POA: Diagnosis present

## 2014-05-26 DIAGNOSIS — Z885 Allergy status to narcotic agent status: Secondary | ICD-10-CM | POA: Diagnosis not present

## 2014-05-26 DIAGNOSIS — I5032 Chronic diastolic (congestive) heart failure: Secondary | ICD-10-CM | POA: Diagnosis present

## 2014-05-26 DIAGNOSIS — Z96659 Presence of unspecified artificial knee joint: Secondary | ICD-10-CM | POA: Diagnosis not present

## 2014-05-26 DIAGNOSIS — K219 Gastro-esophageal reflux disease without esophagitis: Secondary | ICD-10-CM | POA: Diagnosis present

## 2014-05-26 DIAGNOSIS — J45909 Unspecified asthma, uncomplicated: Secondary | ICD-10-CM | POA: Diagnosis present

## 2014-05-26 DIAGNOSIS — I130 Hypertensive heart and chronic kidney disease with heart failure and stage 1 through stage 4 chronic kidney disease, or unspecified chronic kidney disease: Secondary | ICD-10-CM | POA: Diagnosis present

## 2014-05-26 DIAGNOSIS — I4891 Unspecified atrial fibrillation: Secondary | ICD-10-CM | POA: Diagnosis present

## 2014-05-26 DIAGNOSIS — Z881 Allergy status to other antibiotic agents status: Secondary | ICD-10-CM | POA: Diagnosis not present

## 2014-05-26 DIAGNOSIS — E785 Hyperlipidemia, unspecified: Secondary | ICD-10-CM | POA: Diagnosis present

## 2014-05-26 DIAGNOSIS — N184 Chronic kidney disease, stage 4 (severe): Secondary | ICD-10-CM | POA: Diagnosis present

## 2014-05-26 DIAGNOSIS — E669 Obesity, unspecified: Secondary | ICD-10-CM | POA: Diagnosis present

## 2014-05-26 DIAGNOSIS — Z9861 Coronary angioplasty status: Secondary | ICD-10-CM | POA: Diagnosis not present

## 2014-05-26 DIAGNOSIS — Z9109 Other allergy status, other than to drugs and biological substances: Secondary | ICD-10-CM | POA: Diagnosis not present

## 2014-05-26 DIAGNOSIS — N179 Acute kidney failure, unspecified: Secondary | ICD-10-CM | POA: Diagnosis present

## 2014-05-26 DIAGNOSIS — E119 Type 2 diabetes mellitus without complications: Secondary | ICD-10-CM | POA: Diagnosis present

## 2014-05-26 DIAGNOSIS — R51 Headache: Secondary | ICD-10-CM | POA: Diagnosis not present

## 2014-05-26 DIAGNOSIS — I252 Old myocardial infarction: Secondary | ICD-10-CM | POA: Diagnosis not present

## 2014-05-26 DIAGNOSIS — E876 Hypokalemia: Secondary | ICD-10-CM | POA: Diagnosis present

## 2014-05-26 DIAGNOSIS — I495 Sick sinus syndrome: Secondary | ICD-10-CM | POA: Diagnosis present

## 2014-05-26 DIAGNOSIS — I2510 Atherosclerotic heart disease of native coronary artery without angina pectoris: Secondary | ICD-10-CM | POA: Diagnosis present

## 2014-05-26 DIAGNOSIS — Z8249 Family history of ischemic heart disease and other diseases of the circulatory system: Secondary | ICD-10-CM | POA: Diagnosis not present

## 2014-05-26 DIAGNOSIS — Z87891 Personal history of nicotine dependence: Secondary | ICD-10-CM | POA: Diagnosis not present

## 2014-05-26 DIAGNOSIS — Z880 Allergy status to penicillin: Secondary | ICD-10-CM | POA: Diagnosis not present

## 2014-05-26 DIAGNOSIS — Z794 Long term (current) use of insulin: Secondary | ICD-10-CM | POA: Diagnosis not present

## 2014-05-26 DIAGNOSIS — K2270 Barrett's esophagus without dysplasia: Secondary | ICD-10-CM | POA: Diagnosis present

## 2014-05-26 DIAGNOSIS — R42 Dizziness and giddiness: Secondary | ICD-10-CM | POA: Diagnosis not present

## 2014-05-26 DIAGNOSIS — Z6838 Body mass index (BMI) 38.0-38.9, adult: Secondary | ICD-10-CM | POA: Diagnosis not present

## 2014-05-26 DIAGNOSIS — I498 Other specified cardiac arrhythmias: Secondary | ICD-10-CM | POA: Diagnosis not present

## 2014-05-26 DIAGNOSIS — I4892 Unspecified atrial flutter: Secondary | ICD-10-CM | POA: Diagnosis present

## 2014-05-26 DIAGNOSIS — R110 Nausea: Secondary | ICD-10-CM | POA: Diagnosis not present

## 2014-05-26 DIAGNOSIS — Z7901 Long term (current) use of anticoagulants: Secondary | ICD-10-CM | POA: Diagnosis not present

## 2014-05-26 DIAGNOSIS — Z79899 Other long term (current) drug therapy: Secondary | ICD-10-CM | POA: Diagnosis not present

## 2014-05-26 LAB — GLUCOSE, CAPILLARY
Glucose-Capillary: 148 mg/dL — ABNORMAL HIGH (ref 70–99)
Glucose-Capillary: 216 mg/dL — ABNORMAL HIGH (ref 70–99)
Glucose-Capillary: 215 mg/dL — ABNORMAL HIGH (ref 70–99)
Glucose-Capillary: 184 mg/dL — ABNORMAL HIGH (ref 70–99)

## 2014-05-26 LAB — BASIC METABOLIC PANEL
Anion gap: 13 (ref 5–15)
BUN: 61 mg/dL — ABNORMAL HIGH (ref 6–23)
CO2: 30 mEq/L (ref 19–32)
Calcium: 9.2 mg/dL (ref 8.4–10.5)
Chloride: 96 mEq/L (ref 96–112)
Creatinine, Ser: 3.44 mg/dL — ABNORMAL HIGH (ref 0.50–1.10)
GFR calc Af Amer: 15 mL/min — ABNORMAL LOW (ref >90)
GFR calc non Af Amer: 13 mL/min — ABNORMAL LOW (ref >90)
Glucose, Bld: 224 mg/dL — ABNORMAL HIGH (ref 70–99)
Potassium: 2.9 mEq/L — CL (ref 3.7–5.3)
Sodium: 139 mEq/L (ref 137–147)

## 2014-05-26 MED ORDER — ZOLPIDEM TARTRATE 5 MG PO TABS
5.0000 mg | ORAL_TABLET | Freq: Every evening | ORAL | Status: DC | PRN
  Administered 2014-05-26 – 2014-06-02 (×8): 5 mg via ORAL
  Filled 2014-05-26 (×7): qty 1

## 2014-05-26 MED ORDER — CHLORHEXIDINE GLUCONATE 4 % EX LIQD
60.0000 mL | Freq: Once | CUTANEOUS | Status: AC
  Administered 2014-05-27: 4 via TOPICAL
  Filled 2014-05-26: qty 60

## 2014-05-26 MED ORDER — GLUCAGON HCL RDNA (DIAGNOSTIC) 1 MG IJ SOLR
3.0000 mg/h | INTRAMUSCULAR | Status: AC
  Administered 2014-05-26: 5 mg/h via INTRAVENOUS
  Filled 2014-05-26: qty 10

## 2014-05-26 MED ORDER — ZOLPIDEM TARTRATE 5 MG PO TABS: 10.0000 mg | ORAL_TABLET | Freq: Every evening | ORAL | Status: DC | PRN

## 2014-05-26 MED ORDER — CETYLPYRIDINIUM CHLORIDE 0.05 % MT LIQD
7.0000 mL | Freq: Two times a day (BID) | OROMUCOSAL | Status: DC
  Administered 2014-05-26 – 2014-06-03 (×16): 7 mL via OROMUCOSAL

## 2014-05-26 MED ORDER — DOPAMINE-DEXTROSE 3.2-5 MG/ML-% IV SOLN
5.0000 ug/kg/min | INTRAVENOUS | Status: DC
Start: 2014-05-26 — End: 2014-05-27
  Administered 2014-05-26: 5 ug/kg/min via INTRAVENOUS
  Filled 2014-05-26: qty 250

## 2014-05-26 MED ORDER — POTASSIUM CHLORIDE CRYS ER 20 MEQ PO TBCR
40.0000 meq | EXTENDED_RELEASE_TABLET | Freq: Once | ORAL | Status: AC
  Administered 2014-05-26: 40 meq via ORAL
  Filled 2014-05-26: qty 2

## 2014-05-26 MED ORDER — VANCOMYCIN HCL 10 G IV SOLR
1500.0000 mg | INTRAVENOUS | Status: DC
  Filled 2014-05-26: qty 1500

## 2014-05-26 MED ORDER — CHLORHEXIDINE GLUCONATE 4 % EX LIQD
60.0000 mL | Freq: Once | CUTANEOUS | Status: AC
  Administered 2014-05-26: 4 via TOPICAL
  Filled 2014-05-26: qty 60

## 2014-05-26 MED ORDER — SODIUM CHLORIDE 0.9 % IR SOLN
80.0000 mg | Status: DC
Start: 2014-05-27 — End: 2014-05-27
  Filled 2014-05-26: qty 2

## 2014-05-26 MED ORDER — SODIUM CHLORIDE 0.9 % IV SOLN
INTRAVENOUS | Status: DC
  Administered 2014-05-27: 06:00:00 via INTRAVENOUS

## 2014-05-26 NOTE — Progress Notes (Signed)
CRITICAL VALUE ALERT  Critical value received:  K 2.9  Date of notification:  05/26/2014  Time of notification:  [id number]  Critical value read back: yes  Nurse who received alert:  Allen Derry   MD notified (1st page):  [place]  Time of first page:  0335  Responding MD:  Aundra Dubin  Time MD responded:  [phone]  Will administer 40 mEq potassium chloride PO.  Will continue to monitor carefully

## 2014-05-26 NOTE — Progress Notes (Signed)
Cardiology MD on call paged at [phone] regarding patient's low junctional heart rate and rhythm in high 20's low 30's.  Page included rate and rhythm information and that patient was on glucagon previously with a HR in the 40s, but no more glucagon orders.   Cardiology MD on call paged at [phone] again regarding patient's low junctional heart rate and rhythm 28-31.  Page included information about rate 28-31 and junctional rhythm and that the patient is becoming drowsy but awakes to voice.    Awaiting call from MD.  Will continue to monitor the patient closely.

## 2014-05-26 NOTE — Progress Notes (Signed)
After last glucagon infusion completed patient's heart rate dropped into the mid 20's and rhythm converted from SB to junctional.  Per previous conversation with Dr. Haroldine Laws glucagon drip reordered and started. Patient awake and alert entire time but felt nauseated.  Heart rate currently in high 20's on glucagon drip in a junctional rhythm.  Will continue to monitor closely.

## 2014-05-26 NOTE — Progress Notes (Signed)
Advanced Heart Failure Rounding Note   Subjective:    HR up in 40-50s overnight on glucagon gtt. Now off gtt. HR back in 30s. Denies dizziness or SOB. Renal function improving slowly.   Objective:   Weight Range:  Vital Signs:   Temp:  [97.6 F (36.4 C)-98.2 F (36.8 C)] 97.6 F (36.4 C) (09/22 0300) Pulse Rate:  [29-48] 32 (09/22 0700) Resp:  [13-21] 19 (09/22 0700) BP: (111-152)/(29-91) 122/56 mmHg (09/22 0700) SpO2:  [94 %-100 %] 96 % (09/22 0700) Weight:  [101.107 kg (222 lb 14.4 oz)-102.059 kg (225 lb)] 101.107 kg (222 lb 14.4 oz) (09/22 0500)    Weight change: Filed Weights   05/25/14 1632 05/26/14 0500  Weight: 101.2 kg (223 lb 1.7 oz) 101.107 kg (222 lb 14.4 oz)    Intake/Output:   Intake/Output Summary (Last 24 hours) at 05/26/14 0735 Last data filed at 05/26/14 0700  Gross per 24 hour  Intake    430 ml  Output    600 ml  Net   -170 ml     Physical Exam: General: Obese, NAD, lying in bed HEENT: normal  Neck: supple. JVP hard to see d/t body habitus but does not appear elevated; Carotids 2+ bilat; no bruits. No lymphadenopathy or thryomegaly appreciate  Cor: PMI nonpalpbale. Distant. Loletha Grayer regular. No obvious murmur  Lungs: Diminished in the bases  Abdomen: Obese soft, nontender. Non-distended. No hepatosplenomegaly. No bruits or masses. Good bowel sounds.  Extremities: no cyanosis, clubbing, rash, warm. Trace edema  Neuro: alert & orientedx3, cranial nerves grossly intact. moves all 4 extremities w/o difficulty. Affect    Telemetry: Junctional rhythm 30s  Labs: Basic Metabolic Panel:  Recent Labs Lab 05/25/14 1808 05/26/14 0237  NA 140 139  K 3.1* 2.9*  CL 97 96  CO2 27 30  GLUCOSE 95 224*  BUN 59* 61*  CREATININE 3.62* 3.44*  CALCIUM 9.7 9.2  MG 2.3  --     Liver Function Tests:  Recent Labs Lab 05/25/14 1808  AST 19  ALT 17  ALKPHOS 89  BILITOT 0.2*  PROT 7.8  ALBUMIN 3.5   No results found for this basename: LIPASE, AMYLASE,   in the last 168 hours No results found for this basename: AMMONIA,  in the last 168 hours  CBC:  Recent Labs Lab 05/25/14 1808  WBC 8.7  NEUTROABS 4.2  HGB 12.9  HCT 39.7  MCV 91.1  PLT 260    Cardiac Enzymes: No results found for this basename: CKTOTAL, CKMB, CKMBINDEX, TROPONINI,  in the last 168 hours  BNP: BNP (last 3 results)  Recent Labs  03/03/14 1851 05/25/14 1808  PROBNP 1531.0* 2123.0*     Other results:    Imaging:  No results found.   Medications:     Scheduled Medications: . amitriptyline  25 mg Oral QHS  . apixaban  5 mg Oral BID  . atorvastatin  40 mg Oral q1800  . atropine      . febuxostat  40 mg Oral Daily  . insulin aspart  0-15 Units Subcutaneous TID WC & HS  . insulin glargine  40 Units Subcutaneous Daily  . pantoprazole  40 mg Oral Daily  . sodium chloride  3 mL Intravenous Q12H     Infusions: . DOPamine 5 mcg/kg/min (05/26/14 0732)  . 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 - improving 4) Hypokalemia 5) Afib/Aflutter - resolved  6) DM2   Plan/Discussion:     Remains markedly bradycardic. BP stable. Will continue glucagon and wait this out another 24 hours to see if she needs a PPM. Hold b-blocker, CCB, clonidine and amio. If gets worse can place central access for dopamine +/- TVP. Atropine at bedside. Renal function improving. Hold lasix. Supp K+. Check TFTs.  Length of Stay: 1 [name] MD 05/26/2014, 7:35 AM  Advanced Heart Failure Team Pager [phone] (M-F; [place])  Please contact [facility] for night-coverage after hours  (4p -7a ) and weekends on [email or web address]

## 2014-05-26 NOTE — Addendum Note (Signed)
Encounter addended by: Georga Kaufmann, CCT on: 05/26/2014  8:35 AM<BR>     Documentation filed: Charges VN

## 2014-05-26 NOTE — Progress Notes (Signed)
UR completed 

## 2014-05-26 NOTE — Care Management Note (Signed)
    Page 1 of 2   06/03/2014     11:41:01 AM CARE MANAGEMENT NOTE 06/03/2014  Patient:  Debra, [name]   Account Number:  [id number]  Date Initiated:  05/26/2014  Documentation initiated by:  Elissa Hefty  Subjective/Objective Assessment:   adm w bradycardia     Action/Plan:   lives w fam, pcp dr [name]   Anticipated DC Date:  06/03/2014   Anticipated DC Plan:  [place]         [facility] Choice  Resumption Of Svcs/PTA Provider   Choice offered to / List presented to:  C-1 Patient        [place] arranged  HH-1 RN  [place]      [place] agency  [facility]   Status of service:  Completed, signed off Medicare Important Message given?  YES (If response is "NO", the following Medicare IM given date fields will be blank) Date Medicare IM given:  05/29/2014 Medicare IM given by:  GRAVES-BIGELOW,BRENDA Date Additional Medicare IM given:  06/01/2014 Additional Medicare IM given by:  Marvetta Gibbons  Discharge Disposition:  [place]  Per UR Regulation:  Reviewed for med. necessity/level of care/duration of stay  If discussed at [place] of Stay Meetings, dates discussed:   06/02/2014    Comments:  06/03/14- 55- Marvetta Gibbons RN, BSN [phone] Pt for d/c home today- orders in for resumption of HH -RN/aide with [name]- spoke with Ruffin Pyo with [facility] regarding discharge- orders faxed to Neoma Laming at [place].   05-29-14 [address], [place] [phone] CM faxed information/ orders to [facility]. If pt to d/c over the weekend CM will call to make [facility] aware.    05-28-14 [place], RN,BSN [phone] CM did call [name] and spoke to liaison Ruffin Pyo. Pt has services RN and Aide. Pt will need CSW and PT order for home as well. CM did ask Liaison to see if pt has Medicaid Benefit to see if pt could be set up for [place]. [facility]  to f/u once pt is d/c.  CM will continue to monitor.    9/22 1507 Debra dowell rn,bsn pt act w [facility] for hhrn and aid. pt also has community nse from [facility] that visits monthly.

## 2014-05-27 ENCOUNTER — Encounter (HOSPITAL_COMMUNITY)
Admission: AD | Disposition: A | Discharge status: Home Health | Payer: Commercial Managed Care - HMO | Source: Ambulatory Visit | Attending: Internal Medicine

## 2014-05-27 LAB — BASIC METABOLIC PANEL
Anion gap: 14 (ref 5–15)
BUN: 58 mg/dL — ABNORMAL HIGH (ref 6–23)
CO2: 26 mEq/L (ref 19–32)
Calcium: 9.4 mg/dL (ref 8.4–10.5)
Chloride: 103 mEq/L (ref 96–112)
Creatinine, Ser: 3.13 mg/dL — ABNORMAL HIGH (ref 0.50–1.10)
GFR calc Af Amer: 16 mL/min — ABNORMAL LOW (ref >90)
GFR calc non Af Amer: 14 mL/min — ABNORMAL LOW (ref >90)
Glucose, Bld: 97 mg/dL (ref 70–99)
Potassium: 3.7 mEq/L (ref 3.7–5.3)
Sodium: 143 mEq/L (ref 137–147)

## 2014-05-27 LAB — GLUCOSE, CAPILLARY
Glucose-Capillary: 104 mg/dL — ABNORMAL HIGH (ref 70–99)
Glucose-Capillary: 235 mg/dL — ABNORMAL HIGH (ref 70–99)
Glucose-Capillary: 202 mg/dL — ABNORMAL HIGH (ref 70–99)
Glucose-Capillary: 147 mg/dL — ABNORMAL HIGH (ref 70–99)

## 2014-05-27 LAB — TSH: TSH: 5.59 u[IU]/mL — ABNORMAL HIGH (ref 0.350–4.500)

## 2014-05-27 LAB — T4, FREE: Free T4: 1.4 ng/dL (ref 0.80–1.80)

## 2014-05-27 SURGERY — PERMANENT PACEMAKER INSERTION
Anesthesia: LOCAL

## 2014-05-27 MED ORDER — TORSEMIDE 20 MG PO TABS
20.0000 mg | ORAL_TABLET | Freq: Once | ORAL | Status: AC
  Administered 2014-05-27: 20 mg via ORAL
  Filled 2014-05-27 (×2): qty 1

## 2014-05-27 MED ORDER — POTASSIUM CHLORIDE CRYS ER 20 MEQ PO TBCR
40.0000 meq | EXTENDED_RELEASE_TABLET | Freq: Once | ORAL | Status: AC
  Administered 2014-05-27: 40 meq via ORAL
  Filled 2014-05-27: qty 2

## 2014-05-27 MED ORDER — APIXABAN 5 MG PO TABS
5.0000 mg | ORAL_TABLET | Freq: Two times a day (BID) | ORAL | Status: DC
  Administered 2014-05-27 – 2014-06-03 (×15): 5 mg via ORAL
  Filled 2014-05-27 (×18): qty 1

## 2014-05-27 MED ORDER — HYDRALAZINE HCL 25 MG PO TABS
25.0000 mg | ORAL_TABLET | Freq: Three times a day (TID) | ORAL | Status: DC
  Administered 2014-05-27 – 2014-05-29 (×7): 25 mg via ORAL
  Filled 2014-05-27 (×13): qty 1

## 2014-05-27 NOTE — Progress Notes (Signed)
Advanced Heart Failure Rounding Note   Subjective:    HR up to 50s. Feels better. Diltiazem, lopressor, amio and clonidine remain on hold. Renal function continues to improve.   Objective:   Weight Range:  Vital Signs:   Temp:  [97.5 F (36.4 C)-98.3 F (36.8 C)] 97.5 F (36.4 C) (09/23 0721) Pulse Rate:  [25-55] 55 (09/23 0721) Resp:  [10-18] 17 (09/23 0721) BP: (80-188)/(39-107) 160/55 mmHg (09/23 0721) SpO2:  [89 %-100 %] 100 % (09/23 0721) Weight:  [103 kg (227 lb 1.2 oz)] 103 kg (227 lb 1.2 oz) (09/23 0500) Last BM Date: 05/25/14  Weight change: Filed Weights   05/25/14 1632 05/26/14 0500 05/27/14 0500  Weight: 101.2 kg (223 lb 1.7 oz) 101.107 kg (222 lb 14.4 oz) 103 kg (227 lb 1.2 oz)    Intake/Output:   Intake/Output Summary (Last 24 hours) at 05/27/14 0819 Last data filed at 05/27/14 0700  Gross per 24 hour  Intake 869.67 ml  Output   1700 ml  Net -830.33 ml     Physical Exam: General: Obese, NAD, lying in bed HEENT: normal  Neck: supple. JVP hard to see d/t body habitus but does not appear elevated; Carotids 2+ bilat; no bruits. No lymphadenopathy or thryomegaly appreciate  Cor: PMI nonpalpbale. Distant. Loletha Grayer regular. No obvious murmur  Lungs: Diminished in the bases  Abdomen: Obese soft, nontender. Non-distended. No hepatosplenomegaly. No bruits or masses. Good bowel sounds.  Extremities: no cyanosis, clubbing, rash, warm. Trace  edema  Neuro: alert & orientedx3, cranial nerves grossly intact. moves all 4 extremities w/o difficulty. Affect    Telemetry: Sinus brady 50s  Labs: Basic Metabolic Panel:  Recent Labs Lab 05/25/14 1808 05/26/14 0237  NA 140 139  K 3.1* 2.9*  CL 97 96  CO2 27 30  GLUCOSE 95 224*  BUN 59* 61*  CREATININE 3.62* 3.44*  CALCIUM 9.7 9.2  MG 2.3  --     Liver Function Tests:  Recent Labs Lab 05/25/14 1808  AST 19  ALT 17  ALKPHOS 89  BILITOT 0.2*  PROT 7.8  ALBUMIN 3.5   No results found for this basename:  LIPASE, AMYLASE,  in the last 168 hours No results found for this basename: AMMONIA,  in the last 168 hours  CBC:  Recent Labs Lab 05/25/14 1808  WBC 8.7  NEUTROABS 4.2  HGB 12.9  HCT 39.7  MCV 91.1  PLT 260    Cardiac Enzymes: No results found for this basename: CKTOTAL, CKMB, CKMBINDEX, TROPONINI,  in the last 168 hours  BNP: BNP (last 3 results)  Recent Labs  03/03/14 1851 05/25/14 1808  PROBNP 1531.0* 2123.0*     Other results:    Imaging: No results found.   Medications:     Scheduled Medications: . amitriptyline  25 mg Oral QHS  . antiseptic oral rinse  7 mL Mouth Rinse BID  . atorvastatin  40 mg Oral q1800  . febuxostat  40 mg Oral Daily  . gentamicin irrigation  80 mg Irrigation On Call  . insulin aspart  0-15 Units Subcutaneous TID WC & HS  . insulin glargine  40 Units Subcutaneous Daily  . pantoprazole  40 mg Oral Daily  . sodium chloride  3 mL Intravenous Q12H  . vancomycin  1,500 mg Intravenous On Call    Infusions: . sodium chloride 50 mL/hr at 05/27/14 0616  . 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 - improving 4) Hypokalemia 5) Afib/Aflutter - resolved  6) DM2   Plan/Discussion:    HR much improved after holding diltiazem and b-blockers. Will continue to hold. Remains in NSR. No pacer now.   Will restart amio at low dose tomorrow. Can go to tele. CR to see.   Renal function improved. Weight up. Will give one dose of diuretics today.   Can use hydralazine for HTN.   Length of Stay: 2 [name] MD 05/27/2014, 8:19 AM  Advanced Heart Failure Team Pager [phone] (M-F; 7a - 4p)  Please contact [facility] for night-coverage after hours (4p -7a ) and weekends on [email or web address]

## 2014-05-27 NOTE — Progress Notes (Signed)
CARDIAC REHAB PHASE I   PRE:  Rate/Rhythm: 53 SB    BP: sitting 170/62    SaO2: 96 2L  MODE:  Ambulation: 150 ft   POST:  Rate/Rhythm: 84 SR    BP: sitting 186/65     SaO2: 91 2L  Used RW, 2L O2 (as she does at home). Stood independently and able to walk without c/o. HR gradually increased to 84 SR. Slight SOB toward end, SaO2 91 2L. Return to recliner. Pt sts she only walks in her apartment as she is on the second floor and it takes increased effort to do stairs (with O2, etc). Would like HHRN again at d/c.  1020-1100  [name] CES, ACSM 05/27/2014 10:56 AM

## 2014-05-27 NOTE — Progress Notes (Signed)
Entered in error

## 2014-05-28 ENCOUNTER — Encounter (HOSPITAL_COMMUNITY): Payer: Self-pay | Admitting: Internal Medicine

## 2014-05-28 DIAGNOSIS — N184 Chronic kidney disease, stage 4 (severe): Secondary | ICD-10-CM | POA: Diagnosis not present

## 2014-05-28 DIAGNOSIS — N179 Acute kidney failure, unspecified: Secondary | ICD-10-CM | POA: Diagnosis not present

## 2014-05-28 DIAGNOSIS — G4733 Obstructive sleep apnea (adult) (pediatric): Secondary | ICD-10-CM

## 2014-05-28 DIAGNOSIS — I495 Sick sinus syndrome: Secondary | ICD-10-CM | POA: Diagnosis not present

## 2014-05-28 DIAGNOSIS — I5032 Chronic diastolic (congestive) heart failure: Secondary | ICD-10-CM | POA: Diagnosis not present

## 2014-05-28 LAB — BASIC METABOLIC PANEL
Anion gap: 16 — ABNORMAL HIGH (ref 5–15)
BUN: 39 mg/dL — ABNORMAL HIGH (ref 6–23)
CO2: 25 mEq/L (ref 19–32)
Calcium: 9.5 mg/dL (ref 8.4–10.5)
Chloride: 101 mEq/L (ref 96–112)
Creatinine, Ser: 2.4 mg/dL — ABNORMAL HIGH (ref 0.50–1.10)
GFR calc Af Amer: 23 mL/min — ABNORMAL LOW (ref >90)
GFR calc non Af Amer: 20 mL/min — ABNORMAL LOW (ref >90)
Glucose, Bld: 120 mg/dL — ABNORMAL HIGH (ref 70–99)
Potassium: 3.6 mEq/L — ABNORMAL LOW (ref 3.7–5.3)
Sodium: 142 mEq/L (ref 137–147)

## 2014-05-28 LAB — GLUCOSE, CAPILLARY
Glucose-Capillary: 115 mg/dL — ABNORMAL HIGH (ref 70–99)
Glucose-Capillary: 193 mg/dL — ABNORMAL HIGH (ref 70–99)
Glucose-Capillary: 138 mg/dL — ABNORMAL HIGH (ref 70–99)
Glucose-Capillary: 160 mg/dL — ABNORMAL HIGH (ref 70–99)

## 2014-05-28 MED ORDER — LIVING BETTER WITH HEART FAILURE BOOK
Freq: Once | Status: AC
  Administered 2014-05-28: 12:00:00
  Filled 2014-05-28: qty 1

## 2014-05-28 MED ORDER — DILTIAZEM HCL 60 MG PO TABS
60.0000 mg | ORAL_TABLET | Freq: Four times a day (QID) | ORAL | Status: DC
  Administered 2014-05-29 – 2014-06-02 (×18): 60 mg via ORAL
  Filled 2014-05-28 (×23): qty 1

## 2014-05-28 MED ORDER — AMIODARONE HCL 200 MG PO TABS
200.0000 mg | ORAL_TABLET | Freq: Two times a day (BID) | ORAL | Status: DC
  Administered 2014-05-28 (×2): 200 mg via ORAL
  Filled 2014-05-28 (×2): qty 1

## 2014-05-28 MED ORDER — TORSEMIDE 20 MG PO TABS
20.0000 mg | ORAL_TABLET | Freq: Every day | ORAL | Status: DC
  Administered 2014-05-29 – 2014-06-03 (×6): 20 mg via ORAL
  Filled 2014-05-28 (×6): qty 1

## 2014-05-28 NOTE — Progress Notes (Signed)
CARDIAC REHAB PHASE I   PRE:  Rate/Rhythm: 106-113 afib  BP:  Supine:   Sitting: 170/107,  168/107  Standing:    SaO2: 98% 2L  1445-1510 Was going to walk with pt but BP too elevated. Pt received BP med hydralazine at 1422. Took BP twice as pt had walked to bathroom and then rested. Still elevated. Pt's RN aware and will follow up with BP check.  Will continue to follow.        Graylon Good, RN BSN  05/28/2014 3:08 PM

## 2014-05-28 NOTE — Progress Notes (Signed)
Heart Failure Navigator Consult Note  Presentation: [name] is a 68 year old woman with hypertension, hyperlipidemia, bronchial asthma, diabetes mellitus, CKD stage IV (baseline cr ~2.5), atrial fibrillation and diastolic HF.  Admitted 6/30-7/10/15 for SOB and CP following scheduled cardioversion. Found to have severe aspiration PNA and was intubated. She maintained SR for short period of time and then went back into Afib/Aflutter. HR controlled on amio and diltiazem. Discharged to [facility] at a weight of 232 lbs.  Has been in atrial fibrillation and have been trying to get her stable enough for DC-CV.  Seen in HF Clinic 05/25/14. Has been dizzy and fatigued. + SOB with ambulation. Found to have junctional rhythm with HR in low 30s. No syncope. Weight stable.   Past Medical History  Diagnosis Date  . Atrial flutter   . Hypertension   . Diabetes mellitus   . Diastolic heart failure     a. EF 60-65%, RV nl (03/2014)  . Asthma   . Hyperlipidemia   . Fatty liver   . Esophageal dysmotility   . Arthritis   . Sleep apnea     wears CPAP  . Fatty tumor fatty tumor back  . Coronary atherosclerosis of native coronary artery   . Morbid obesity   . Myocardial infarction 2009  . Dysrhythmia     afib,flutter hx  . Heart murmur   . Peripheral vascular disease   . Pneumonia     hx  . GERD (gastroesophageal reflux disease)     barrets esophagus  . Anginal pain     occ; non-ischemic Lexiscan 09/2012  . Kidney disease     CKD stage IV (Dr. Erling Cruz)  . Complication of anesthesia     " DIFFICULTY BREATHING "    History   Social History  . Marital Status: Widowed    Spouse Name: N/A    Number of Children: 3  . Years of Education: N/A   Social History Main Topics  . Smoking status: Former Research scientist (life sciences)  . Smokeless tobacco: Never Used     Comment: 05/2014  QUIT OVER 20 YEARS AGO "  . Alcohol Use: No  . Drug Use: No  . Sexual Activity: Not Currently   Other Topics Concern  .  None   Social History Narrative  . None    ECHO:Study Conclusions  - Left ventricle: The cavity size was normal. Wall thickness was increased in a pattern of mild LVH. Systolic function was normal. The estimated ejection fraction was in the range of 60% to 65%. Wall motion was normal; there were no regional wall motion abnormalities. The study is not technically sufficient to allow evaluation of LV diastolic function. - Aortic valve: Mildly calcified annulus. Trileaflet; mildly calcified leaflets. There was no significant regurgitation. - Mitral valve: Calcified annulus. Mildly thickened leaflets . There was trivial regurgitation. - Right atrium: Central venous pressure (est): 3 mm Hg. - Atrial septum: The septum was thickened. No defect or patent foramen ovale was identified. - Tricuspid valve: There was trivial regurgitation. - Pulmonary arteries: Systolic pressure could not be accurately estimated. - Pericardium, extracardiac: There was no pericardial effusion.  Impressions:  - Mild LVH with LVEF 60-65%, indeterminate diastolic function. MAC with mildly thickened mitral leaflets, trivial mitral regurgitation. Unable to assess PASP. Thickened interatrial septum without obvious PFO.  Transthoracic echocardiography. M-mode, complete 2D, spectral Doppler, and color Doppler. Birthdate: Patient birthdate: Mar 06, 1946. Age: Patient is 68 yr old. Sex: Gender: female. Height: Height: 162.6 cm. Height: 64  in. Weight: Weight: 86.2 kg. Weight: 189.6 lb. Body mass index: BMI: 32.6 kg/m^2. Body surface area: BSA: 2.01 m^2. Blood pressure: 162/83 Patient status: Inpatient. Study date: Study date: 03/06/2014. Study time: 09:36 AM. Location: ICU/CCU   BNP    Component Value Date/Time   PROBNP 2123.0* 05/25/2014 1808    Education Assessment and Provision:  Detailed education and instructions provided on heart failure disease management including the following:  Signs and symptoms  of Heart Failure When to call the physician Importance of daily weights Low sodium diet Fluid restriction Medication management Anticipated future follow-up appointments  Patient education given on each of the above topics.  Patient acknowledges understanding and acceptance of all instructions.  Patient admits that her daughter is the "one who knows the most about what's going on" in reference to her health.  She does acknowledge that she has received the above information before.  She lives at home with her daughter and her daughter works 3rd shift leaving her alone in the home--.  3am to noon.  She admits that her daughter has found her "down in the floor"  at 3a when she leaves for work.    She attempts to weigh daily and is connected with the AHF clinic.  Education Materials:  "Living Better With Heart Failure" Booklet, Daily Weight Tracker Tool and Heart Failure Educational Video.   High Risk Criteria for Readmission and/or Poor Patient Outcomes:   EF 0000000- No--diastolic HF  2 or more admissions in 6 months- Yes  Difficult social situation- yes  Demonstrates medication noncompliance- No    Barriers of Care:  Knowledge, compliance  Discharge Planning:   Plans to discharge to home with daughter

## 2014-05-28 NOTE — Progress Notes (Signed)
Patient HR sustaining 100's-140's, atrial fibrillation,asymptomatic. Dr.Hager made aware, no new orders given at this time. Will continue to monitor patient.

## 2014-05-28 NOTE — Consult Note (Signed)
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 female with a h/o obesity, HTN, renal failure, atrial fibrillation and atrial flutter who is admitted with symptomatic atrial fibrillation She initially presented with atrial flutter with difficult to control V rates in 2008.  She underwent CTI ablation by Dr Lovena Le at that time.  She appears to have done well, without any further atrial arrhythmias until earlier this year.  She has since been followed by Dr Haroldine Laws.  She has diastolic dysfunction which has been worsened recently by atrial fibrillation.  She has failed medical therapy with amiodarone.  Rate control has been challenging.  She has required metoprolol and diltiazem for rate control previously.  Most recently, she developed symptomatic sinus bradycardia on a combination of amiodarone, metoprolol, and diltiazem.  She has since returned to afib with RVR.  She has symptoms of fatigue and SOB with her atrial arrhythmias.  She is anticoagulated with eliquis.  Today, she denies symptoms of palpitations, chest pain,  dizziness, presyncope, syncope, or neurologic sequela. The patient is tolerating medications without difficulties and is otherwise without complaint today.   Past Medical History  Diagnosis Date  . Atrial flutter     ablated by Dr Lovena Le in 2008  . Hypertension   . Diabetes mellitus   . Diastolic heart failure     a. EF 60-65%, RV nl (03/2014)  . Asthma   . Hyperlipidemia   . Fatty liver   . Esophageal dysmotility   . Arthritis   . Sleep apnea     wears CPAP  . Fatty tumor fatty tumor back  . Coronary atherosclerosis of native coronary artery   . Morbid obesity   . Myocardial infarction 2009  . Heart murmur   . Peripheral vascular disease   . Pneumonia     hx  . GERD (gastroesophageal reflux disease)     barrets esophagus  . Anginal pain     occ; non-ischemic Lexiscan 09/2012  . Kidney disease     CKD  stage IV (Dr. Erling Cruz)  . Complication of anesthesia     " DIFFICULTY BREATHING "  . Persistent atrial fibrillation   . Tachycardia-bradycardia    Past Surgical History  Procedure Laterality Date  . Coronary angioplasty with stent placement    . Breast lumpectomy      right  . Tubal ligation    . Tonsillectomy    . Total knee arthroplasty Right 12/15/2013    Procedure: RIGHT TOTAL KNEE ARTHROPLASTY;  Surgeon: Alta Corning, MD;  Location: [facility];  Service: Orthopedics;  Laterality: Right;  . Cardioversion N/A 03/03/2014    Procedure: CARDIOVERSION;  Surgeon: Laverda Page, MD;  Location: [facility] ENDOSCOPY;  Service: Cardiovascular;  Laterality: N/A;  . Atrial flutter ablation  2008    CTI ablation by Dr Lovena Le    Current Facility-Administered Medications  Medication Dose Route Frequency Provider Last Rate Last Dose  . 0.9 %  sodium chloride infusion  250 mL Intravenous PRN Rande Brunt, NP   250 mL at 05/26/14 1900  . acetaminophen (TYLENOL) tablet 650 mg  650 mg Oral Q4H PRN Rande Brunt, NP   650 mg at 05/28/14 2145  . amitriptyline (ELAVIL) tablet 25 mg  25 mg Oral QHS Rande Brunt, NP   25 mg at 05/28/14 2226  . antiseptic oral rinse (CPC / CETYLPYRIDINIUM CHLORIDE 0.05%) solution 7 mL  7 mL Mouth Rinse  BID Jolaine Artist, MD   7 mL at 05/28/14 2227  . apixaban (ELIQUIS) tablet 5 mg  5 mg Oral BID Jolaine Artist, MD   5 mg at 05/28/14 2145  . atorvastatin (LIPITOR) tablet 40 mg  40 mg Oral q1800 Rande Brunt, NP   40 mg at 05/28/14 1802  . [START ON 05/29/2014] diltiazem (CARDIZEM) tablet 60 mg  60 mg Oral 4 times per day [name], MD      . febuxostat (ULORIC) tablet 40 mg  40 mg Oral Daily Rande Brunt, NP   40 mg at 05/28/14 1002  . hydrALAZINE (APRESOLINE) injection 10 mg  10 mg Intravenous Q4H PRN Jolaine Artist, MD      . hydrALAZINE (APRESOLINE) tablet 25 mg  25 mg Oral 3 times per day Jolaine Artist, MD   25 mg at 05/28/14 2145  . insulin  aspart (novoLOG) injection 0-15 Units  0-15 Units Subcutaneous TID WC & HS Larey Dresser, MD   3 Units at 05/28/14 1802  . insulin glargine (LANTUS) injection 40 Units  40 Units Subcutaneous Daily Rande Brunt, NP   40 Units at 05/28/14 1002  . methocarbamol (ROBAXIN) tablet 750 mg  750 mg Oral Q8H PRN Rande Brunt, NP      . ondansetron [facility]) injection 4 mg  4 mg Intravenous Q6H PRN Rande Brunt, NP   4 mg at 05/26/14 0600  . oxyCODONE-acetaminophen (PERCOCET/ROXICET) 5-325 MG per tablet 1 tablet  1 tablet Oral Q6H PRN Rande Brunt, NP   1 tablet at 05/26/14 1554  . pantoprazole (PROTONIX) EC tablet 40 mg  40 mg Oral Daily Rande Brunt, NP   40 mg at 05/28/14 1002  . sodium chloride 0.9 % injection 3 mL  3 mL Intravenous Q12H Rande Brunt, NP   3 mL at 05/28/14 2147  . sodium chloride 0.9 % injection 3 mL  3 mL Intravenous PRN Rande Brunt, NP      . Derrill Memo ON 05/29/2014] torsemide (DEMADEX) tablet 20 mg  20 mg Oral Daily Amy D Clegg, NP      . zolpidem (AMBIEN) tablet 5 mg  5 mg Oral QHS PRN Jolaine Artist, MD   5 mg at 05/28/14 2226    Allergies  Allergen Reactions  . Codeine Nausea And Vomiting  . Penicillins Nausea And Vomiting  . Sulfa Antibiotics Itching and Nausea And Vomiting    "everything I seen was red"  . Other Itching    Adhesive from ekg leads    History   Social History  . Marital Status: Widowed    Spouse Name: N/A    Number of Children: 3  . Years of Education: N/A   Occupational History  . Not on file.   Social History Main Topics  . Smoking status: Former Research scientist (life sciences)  . Smokeless tobacco: Never Used     Comment: 05/2014  QUIT OVER 20 YEARS AGO "  . Alcohol Use: No  . Drug Use: No  . Sexual Activity: Not Currently   Other Topics Concern  . Not on file   Social History Narrative  . No narrative on file    Family History  Problem Relation Age of Onset  . Heart disease Mother   . Cancer Mother     bladder  . Kidney disease Mother     . Ovarian cancer Daughter   . Stomach cancer Maternal Uncle   .  Colon cancer Maternal Aunt   . Esophageal cancer Neg Hx     ROS- All systems are reviewed and negative except as per the HPI above  Physical Exam: Filed Vitals:   05/28/14 1545 05/28/14 1719 05/28/14 2045 05/28/14 2122  BP: 160/99 96/47 165/102 178/100  Pulse:  76 125 102  Temp:    98.3 F (36.8 C)  TempSrc:    Oral  Resp:  17 20 20   Height:      Weight:      SpO2:  98% 99% 99%  Wt 225 lbs,  Body mass index is 38.69 kg/(m^2).   GEN- The patient is overweight and chronically ill appearing, alert and oriented x 3 today.   Head- normocephalic, atraumatic Eyes-  Sclera clear, conjunctiva pink Ears- hearing intact Oropharynx- clear Neck- supple,   Lungs- Clear to ausculation bilaterally, normal work of breathing Heart- tachycardic irregular rhythm, no murmurs, rubs or gallops, PMI not laterally displaced GI- soft, NT, ND, + BS Extremities- no clubbing, cyanosis, + dependant edema MS- no significant deformity or atrophy Skin- no rash or lesion Psych- euthymic mood, full affect Neuro- strength and sensation are intact  EKG 9/24 is reviewed and reveals afib, V rate 103 bpm, prolonged QT Echo 7/15 is reviewed and reveals preserved EF, LA 74mm  Epic records including Dr Forde Dandy ablation note, Dr Milderd Meager consult note 6/15 and multiple notes from Dr Gillermina Hu clinic are reviewed.  I have also spoken at length with Dr Haroldine Laws about the patient  Assessment and Plan:  1. Persistent atrial fibrillation The patient has symptomatic afib as well as tachycardia/ bradycardia syndrome.  She has failed medical therapy with amiodarone.  In afib, she has relatively elevated V rates.  In sinus, her rates are at times slow.  Therapeutic strategies for afib including rate and rhythm control were discussed in detail with the patient today.  Unfortunately, I think that our ability to control her rhythm is presently limited.  Given  her h/o CAD and renal impairment, I do not feel that she has other AAD options.  Catheter ablation would be a reasonable alternative though the patient has significant comoribities which might limit her success rates.   Though she has had sinus bradycardia during this admission, I think that without ablation, our ability to keep her in sinus is low and therefore potential benefits of PPM implant are reduced (as she does not have bradycardia during afib).  I would also like to avoid AV nodal ablation if possible.  I would therefore recommend stopping amiodarone and restarting cardizem which can be titrated for rate control (goal HR < 100 bpm).  If she does well with this strategy, then I would probably plan to pursue it long term.  IF however she does not tolerate this strategy then we could consider catheter ablation as an elective option down the road.  2. Obesity Body mass index is 38.69 kg/(m^2). Weight loss is strongly advised.  Per Guijian et al (PACE 2013; [id number]), patients with BMI 25-29.9 (obese) have a 27% increase in AF recurrence post ablation.  Patients with BMI >30 have a 31% increase in AF recurrence post ablation when compared to those with BMI <25.  3. OSA Compliance with CPAP is also advised  Dr Haroldine Laws to continue to work on rate control while here.  Once hear rates are< 100 bpm she could be discharged.  I am happy to see her again in clinic if catheter ablation is desired.  IF she has  recurrent symptomatic bradycardia with the above strategy, we may have to consider PPM implant though I would otherwise try to avoid this.  Electrophysiology team to see as needed while here. Please call with questions.

## 2014-05-28 NOTE — Progress Notes (Addendum)
Advanced Heart Failure Rounding Note   Subjective:    HR in 100-115s and back in A fib. Feels fine. No dyspnea/orthopnea.   Creatinine trending down 3.1>2.4  Objective:   Weight Range:  Vital Signs:   Temp:  [98.3 F (36.8 C)-98.4 F (36.9 C)] 98.3 F (36.8 C) (09/24 0512) Pulse Rate:  [69-77] 77 (09/24 0512) Resp:  [16] 16 (09/23 2104) BP: (140-159)/(69-72) 140/72 mmHg (09/24 0512) SpO2:  [98 %-100 %] 98 % (09/24 0512) Weight:  [225 lb 8 oz (102.286 kg)] 225 lb 8 oz (102.286 kg) (09/24 0512) Last BM Date: 05/27/14  Weight change: Filed Weights   05/27/14 0500 05/27/14 1316 05/28/14 0512  Weight: 227 lb 1.2 oz (103 kg) 224 lb 3.2 oz (101.696 kg) 225 lb 8 oz (102.286 kg)    Intake/Output:   Intake/Output Summary (Last 24 hours) at 05/28/14 1345 Last data filed at 05/28/14 0900  Gross per 24 hour  Intake    240 ml  Output      0 ml  Net    240 ml     Physical Exam: General: Obese, NAD, lying in bed HEENT: normal  Neck: supple. JVP hard to see d/t body habitus but does not appear elevated; Carotids 2+ bilat; no bruits. No lymphadenopathy or thryomegaly appreciate  Cor: PMI nonpalpbale. Distant. Irregular. No obvious murmur  Lungs: Diminished in the bases  Abdomen: Obese soft, nontender. Non-distended. No hepatosplenomegaly. No bruits or masses. Good bowel sounds.  Extremities: no cyanosis, clubbing, rash, warm.No edema  Neuro: alert & orientedx3, cranial nerves grossly intact. moves all 4 extremities w/o difficulty. Affect    Telemetry:  Afib 100-115s   Labs: Basic Metabolic Panel:  Recent Labs Lab 05/25/14 1808 05/26/14 0237 05/27/14 0620 05/28/14 0630  NA 140 139 143 142  K 3.1* 2.9* 3.7 3.6*  CL 97 96 103 101  CO2 27 30 26 25   GLUCOSE 95 224* 97 120*  BUN 59* 61* 58* 39*  CREATININE 3.62* 3.44* 3.13* 2.40*  CALCIUM 9.7 9.2 9.4 9.5  MG 2.3  --   --   --     Liver Function Tests:  Recent Labs Lab 05/25/14 1808  AST 19  ALT 17  ALKPHOS 89   BILITOT 0.2*  PROT 7.8  ALBUMIN 3.5   No results found for this basename: LIPASE, AMYLASE,  in the last 168 hours No results found for this basename: AMMONIA,  in the last 168 hours  CBC:  Recent Labs Lab 05/25/14 1808  WBC 8.7  NEUTROABS 4.2  HGB 12.9  HCT 39.7  MCV 91.1  PLT 260    Cardiac Enzymes: No results found for this basename: CKTOTAL, CKMB, CKMBINDEX, TROPONINI,  in the last 168 hours  BNP: BNP (last 3 results)  Recent Labs  03/03/14 1851 05/25/14 1808  PROBNP 1531.0* 2123.0*     Other results:    Imaging: No results found.   Medications:     Scheduled Medications: . amiodarone  200 mg Oral BID  . amitriptyline  25 mg Oral QHS  . antiseptic oral rinse  7 mL Mouth Rinse BID  . apixaban  5 mg Oral BID  . atorvastatin  40 mg Oral q1800  . febuxostat  40 mg Oral Daily  . hydrALAZINE  25 mg Oral 3 times per day  . insulin aspart  0-15 Units Subcutaneous TID WC & HS  . insulin glargine  40 Units Subcutaneous Daily  . pantoprazole  40 mg Oral Daily  .  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 - improving 4) Hypokalemia 5) Afib/Aflutter - resolved  6) DM2   Plan/Discussion:    Back in A fib. EP to evaluate.   Renal function improved. Weight up 1 pound.  Start torsemide 20 mg tomorrow.   Can use hydralazine for HTN.   Length of Stay: 3 CLEGG,AMY NP-C 05/28/2014, 1:45 PM  Advanced Heart Failure Team Pager [phone] (M-F; [place])  Please contact [facility] for night-coverage after hours (4p -7a ) and weekends on [email or web address]  Feels fine but back in AF with RVR. We will restart amio at 200 bid.  Avoid b-blocker and diltiazem due to recent severe symptomatic bradycardia. Will ask EP to see re PPM for tachy-brady syndrome. Restart demadex tomorrow.  Daniel Bensimhon,MD 1:54 PM

## 2014-05-29 LAB — GLUCOSE, CAPILLARY
Glucose-Capillary: 189 mg/dL — ABNORMAL HIGH (ref 70–99)
Glucose-Capillary: 240 mg/dL — ABNORMAL HIGH (ref 70–99)
Glucose-Capillary: 232 mg/dL — ABNORMAL HIGH (ref 70–99)
Glucose-Capillary: 220 mg/dL — ABNORMAL HIGH (ref 70–99)

## 2014-05-29 LAB — BASIC METABOLIC PANEL
Anion gap: 18 — ABNORMAL HIGH (ref 5–15)
BUN: 27 mg/dL — ABNORMAL HIGH (ref 6–23)
CO2: 25 mEq/L (ref 19–32)
Calcium: 9.8 mg/dL (ref 8.4–10.5)
Chloride: 98 mEq/L (ref 96–112)
Creatinine, Ser: 2.04 mg/dL — ABNORMAL HIGH (ref 0.50–1.10)
GFR calc Af Amer: 28 mL/min — ABNORMAL LOW (ref >90)
GFR calc non Af Amer: 24 mL/min — ABNORMAL LOW (ref >90)
Glucose, Bld: 245 mg/dL — ABNORMAL HIGH (ref 70–99)
Potassium: 3.9 mEq/L (ref 3.7–5.3)
Sodium: 141 mEq/L (ref 137–147)

## 2014-05-29 MED ORDER — METOPROLOL TARTRATE 50 MG PO TABS
50.0000 mg | ORAL_TABLET | Freq: Two times a day (BID) | ORAL | Status: DC
  Administered 2014-05-29 – 2014-05-30 (×3): 50 mg via ORAL
  Filled 2014-05-29 (×4): qty 1

## 2014-05-29 MED ORDER — HYDRALAZINE HCL 50 MG PO TABS
50.0000 mg | ORAL_TABLET | Freq: Three times a day (TID) | ORAL | Status: DC
Start: 2014-05-29 — End: 2014-05-31
  Administered 2014-05-29 – 2014-05-31 (×6): 50 mg via ORAL
  Filled 2014-05-29 (×9): qty 1

## 2014-05-29 MED ORDER — POTASSIUM CHLORIDE CRYS ER 20 MEQ PO TBCR
20.0000 meq | EXTENDED_RELEASE_TABLET | Freq: Once | ORAL | Status: AC
  Administered 2014-05-29: 20 meq via ORAL
  Filled 2014-05-29: qty 1

## 2014-05-29 NOTE — Plan of Care (Signed)
Problem: Phase II Progression Outcomes Goal: Other Phase II Outcomes/Goals Outcome: Completed/Met Date Met:  05/29/14 Patient's meds changed from PO Amio to PO Dilt.  Continuous cardiac monitoring captured one episode of an HR acceleration to the 140s.  Patient remained asymptomatic.  HR returned to low 100s.  No other acute events occurred this shift.  Will continue to monitor patient condition.

## 2014-05-29 NOTE — Progress Notes (Signed)
Patient walked to bathroom by herself this evening.Patient stated,"I started feeling a little lightheaded and it scarred me."Patient called out for some assistance.Upon entering room found patient lying in bed and anxious about lightheaded episode.Patient is neuro intact slight weakness bilateral lower extremities.Encouraged patient not to get up out of bed alone to call for assistance from nursing staff.

## 2014-05-29 NOTE — Progress Notes (Signed)
Advanced Heart Failure Rounding Note   Subjective:    68 y/o woman with diastolic HF, CKD and PAF. Admitted with severe junctional bradycardia (HR 27-32) in setting of amio 200 daily/cardizem 240 daily/lopressor 75 bid. All AV nodal agents stopped and HR came up but now has gone back into AF. EP feels she hs failed amio. Recommend rate control efforts and if fails consider AF ablation.   Feels ok. Remains in AF. Seen by EP yesterday. Amio stopped. Diltiazem restarted. Rate faster again this am. Tele with AF 100-145.  Denies dyspnea or palpitations. Renal function improved.Torsemide restarted at reduced dose.   Objective:   Weight Range:  Vital Signs:   Temp:  [98.3 F (36.8 C)] 98.3 F (36.8 C) (09/25 0419) Pulse Rate:  [76-127] 95 (09/25 0724) Resp:  [17-20] 20 (09/25 0419) BP: (96-186)/(47-103) 186/97 mmHg (09/25 0724) SpO2:  [98 %-99 %] 99 % (09/25 0419) Weight:  [101.56 kg (223 lb 14.4 oz)] 101.56 kg (223 lb 14.4 oz) (09/25 0419) Last BM Date: 05/27/14  Weight change: Filed Weights   05/27/14 1316 05/28/14 0512 05/29/14 0419  Weight: 101.696 kg (224 lb 3.2 oz) 102.286 kg (225 lb 8 oz) 101.56 kg (223 lb 14.4 oz)    Intake/Output:   Intake/Output Summary (Last 24 hours) at 05/29/14 [id number] Last data filed at 05/29/14 0853  Gross per 24 hour  Intake    480 ml  Output    400 ml  Net     80 ml     Physical Exam: General: Sitting in chair NAD HEENT: normal  Neck: supple. JVP 7; Carotids 2+ bilat; no bruits. No lymphadenopathy or thryomegaly appreciate  Cor: PMI nonpalpbale. Distant. Irregular. Fast.  No obvious murmur  Lungs: Diminished in the bases  Abdomen: Obese soft, nontender. Non-distended. No hepatosplenomegaly. No bruits or masses. Good bowel sounds.  Extremities: no cyanosis, clubbing, rash, warm.No edema  Neuro: alert & orientedx3, cranial nerves grossly intact. moves all 4 extremities w/o difficulty. Affect    Telemetry:  Afib 100-145  Labs: Basic Metabolic  Panel:  Recent Labs Lab 05/25/14 1808 05/26/14 0237 05/27/14 0620 05/28/14 0630  NA 140 139 143 142  K 3.1* 2.9* 3.7 3.6*  CL 97 96 103 101  CO2 27 30 26 25   GLUCOSE 95 224* 97 120*  BUN 59* 61* 58* 39*  CREATININE 3.62* 3.44* 3.13* 2.40*  CALCIUM 9.7 9.2 9.4 9.5  MG 2.3  --   --   --     Liver Function Tests:  Recent Labs Lab 05/25/14 1808  AST 19  ALT 17  ALKPHOS 89  BILITOT 0.2*  PROT 7.8  ALBUMIN 3.5   No results found for this basename: LIPASE, AMYLASE,  in the last 168 hours No results found for this basename: AMMONIA,  in the last 168 hours  CBC:  Recent Labs Lab 05/25/14 1808  WBC 8.7  NEUTROABS 4.2  HGB 12.9  HCT 39.7  MCV 91.1  PLT 260    Cardiac Enzymes: No results found for this basename: CKTOTAL, CKMB, CKMBINDEX, TROPONINI,  in the last 168 hours  BNP: BNP (last 3 results)  Recent Labs  03/03/14 1851 05/25/14 1808  PROBNP 1531.0* 2123.0*     Other results:    Imaging: No results found.   Medications:     Scheduled Medications: . amitriptyline  25 mg Oral QHS  . antiseptic oral rinse  7 mL Mouth Rinse BID  . apixaban  5 mg Oral BID  .  atorvastatin  40 mg Oral q1800  . diltiazem  60 mg Oral 4 times per day  . febuxostat  40 mg Oral Daily  . hydrALAZINE  25 mg Oral 3 times per day  . insulin aspart  0-15 Units Subcutaneous TID WC & HS  . insulin glargine  40 Units Subcutaneous Daily  . pantoprazole  40 mg Oral Daily  . sodium chloride  3 mL Intravenous Q12H  . 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 - improving 4) Hypokalemia 5) Afib/Aflutter - resolved  6) DM2   Plan/Discussion:     Back in A fib. EP feels she has failed amio. Will attempt rate control strategy and see if she can tolerate. Add metoprolol 50 bid. If fails rate control consider AF ablation.   Renal function improved. Weight at baseline. Continue torsemide 20 daiyl.   Can use hydralazine for HTN.   Length of Stay: [place] NP-C 05/29/2014, 9:22 AM  Advanced Heart Failure Team Pager [phone] (M-F; 7a - 4p)  Please contact [facility] for night-coverage after hours (4p -7a ) and weekends on [email or web address]

## 2014-05-29 NOTE — Discharge Instructions (Addendum)
Information on my medicine - ELIQUIS (apixaban)  This medication education was reviewed with me or my healthcare representative as part of my discharge preparation.  The pharmacist that spoke with me during my hospital stay was:  Von Nils Flack, RPH  Why was Eliquis prescribed for you? Eliquis was prescribed for you to reduce the risk of a blood clot forming that can cause a stroke if you have a medical condition called atrial fibrillation (a type of irregular heartbeat).  What do You need to know about Eliquis ? Take your Eliquis TWICE DAILY - one tablet in the morning and one tablet in the evening with or without food. If you have difficulty swallowing the tablet whole please discuss with your pharmacist how to take the medication safely.  Take Eliquis exactly as prescribed by your doctor and DO NOT stop taking Eliquis without talking to the doctor who prescribed the medication.  Stopping may increase your risk of developing a stroke.  Refill your prescription before you run out.  After discharge, you should have regular check-up appointments with your healthcare provider that is prescribing your Eliquis.  In the future your dose may need to be changed if your kidney function or weight changes by a significant amount or as you get older.  What do you do if you miss a dose? If you miss a dose, take it as soon as you remember on the same day and resume taking twice daily.  Do not take more than one dose of ELIQUIS at the same time to make up a missed dose.  Important Safety Information A possible side effect of Eliquis is bleeding. You should call your healthcare provider right away if you experience any of the following:   Bleeding from an injury or your nose that does not stop.   Unusual colored urine (red or dark brown) or unusual colored stools (red or black).   Unusual bruising for unknown reasons.   A serious fall or if you hit your head (even if there is no  bleeding).  Some medicines may interact with Eliquis and might increase your risk of bleeding or clotting while on Eliquis. To help avoid this, consult your healthcare provider or pharmacist prior to using any new prescription or non-prescription medications, including herbals, vitamins, non-steroidal anti-inflammatory drugs (NSAIDs) and supplements.  This website has more information on Eliquis (apixaban): http://www.eliquis.com/eliquis/home   Atrial Fibrillation Atrial fibrillation is a condition that causes your heart to beat irregularly. It may also cause your heart to beat faster than normal. Atrial fibrillation can prevent your heart from pumping blood normally. It increases your risk of stroke and heart problems. HOME CARE  Take medications as told by your doctor.  Only take medications that your doctor says are safe. Some medications can make the condition worse or happen again.  If blood thinners were prescribed by your doctor, take them exactly as told. Too much can cause bleeding. Too little and you will not have the needed protection against stroke and other problems.  Perform blood tests at home if told by your doctor.  Perform blood tests exactly as told by your doctor.  Do not drink alcohol.  Do not drink beverages with caffeine such as coffee, soda, and some teas.  Maintain a healthy weight.  Do not use diet pills unless your doctor says they are safe. They may make heart problems worse.  Follow diet instructions as told by your doctor.  Exercise regularly as told by your doctor.  Keep all follow-up appointments. GET HELP IF:  You notice a change in the speed, rhythm, or strength of your heartbeat.  You suddenly begin peeing (urinating) more often.  You get tired more easily when moving or exercising. GET HELP RIGHT AWAY IF:   You have chest or belly (abdominal) pain.  You feel sick to your stomach (nauseous).  You are short of breath.  You suddenly  have swollen feet and ankles.  You feel dizzy.  You face, arms, or legs feel numb or weak.  There is a change in your vision or speech. MAKE SURE YOU:   Understand these instructions.  Will watch your condition.  Will get help right away if you are not doing well or get worse. Document Released: 05/30/2008 Document Revised: 01/05/2014 Document Reviewed: 10/01/2012 [facility] Patient Information 2015 [place], [place]. This information is not intended to replace advice given to you by your health care provider. Make sure you discuss any questions you have with your health care provider.  Heart Failure Heart failure means your heart has trouble pumping blood. This makes it hard for your body to work well. Heart failure is usually a long-term (chronic) condition. You must take good care of yourself and follow your doctor's treatment plan. HOME CARE  Take your heart medicine as told by your doctor.  Do not stop taking medicine unless your doctor tells you to.  Do not skip any dose of medicine.  Refill your medicines before they run out.  Take other medicines only as told by your doctor or pharmacist.  Stay active if told by your doctor. The elderly and people with severe heart failure should talk with a doctor about physical activity.  Eat heart-healthy foods. Choose foods that are without trans fat and are low in saturated fat, cholesterol, and salt (sodium). This includes fresh or frozen fruits and vegetables, fish, lean meats, fat-free or low-fat dairy foods, whole grains, and high-fiber foods. Lentils and dried peas and beans (legumes) are also good choices.  Limit salt if told by your doctor.  Cook in a healthy way. Roast, grill, broil, bake, poach, steam, or stir-fry foods.  Limit fluids as told by your doctor.  Weigh yourself every morning. Do this after you pee (urinate) and before you eat breakfast. Write down your weight to give to your doctor.  Take your blood pressure  and write it down if your doctor tells you to.  Ask your doctor how to check your pulse. Check your pulse as told.  Lose weight if told by your doctor.  Stop smoking or chewing tobacco. Do not use gum or patches that help you quit without your doctor's approval.  Schedule and go to doctor visits as told.  Nonpregnant women should have no more than 1 drink a day. Men should have no more than 2 drinks a day. Talk to your doctor about drinking alcohol.  Stop illegal drug use.  Stay current with shots (immunizations).  Manage your health conditions as told by your doctor.  Learn to manage your stress.  Rest when you are tired.  If it is really hot outside:  Avoid intense activities.  Use air conditioning or fans, or get in a cooler place.  Avoid caffeine and alcohol.  Wear loose-fitting, lightweight, and light-colored clothing.  If it is really cold outside:  Avoid intense activities.  Layer your clothing.  Wear mittens or gloves, a hat, and a scarf when going outside.  Avoid alcohol.  Learn about heart failure  and get support as needed.  Get help to maintain or improve your quality of life and your ability to care for yourself as needed. GET HELP IF:   You gain 03 lb/1.4 kg or more in 1 day or 05 lb/2.3 kg in a week.  You are more short of breath than usual.  You cannot do your normal activities.  You tire easily.  You cough more than normal, especially with activity.  You have any or more puffiness (swelling) in areas such as your hands, feet, ankles, or belly (abdomen).  You cannot sleep because it is hard to breathe.  You feel like your heart is beating fast (palpitations).  You get dizzy or light-headed when you stand up. GET HELP RIGHT AWAY IF:   You have trouble breathing.  There is a change in mental status, such as becoming less alert or not being able to focus.  You have chest pain or discomfort.  You faint. MAKE SURE YOU:   Understand  these instructions.  Will watch your condition.  Will get help right away if you are not doing well or get worse. Document Released: 05/30/2008 Document Revised: 01/05/2014 Document Reviewed: 10/07/2012 [facility] Patient Information 2015 [place], [place]. This information is not intended to replace advice given to you by your health care provider. Make sure you discuss any questions you have with your health care provider.

## 2014-05-29 NOTE — Progress Notes (Signed)
CARDIAC REHAB PHASE I   PRE:  Rate/Rhythm: 96 afib  BP:  Supine:   Sitting: 144/105 left arm, 157/108 right arm  Standing:    SaO2: 98 % 2L 1350-1410 Was going to walk with pt but blood pressure still elevated. See documentation above. Gave pt Off the Beat booklet and she is going to look over today. Will continue to follow and walk when BP more stable.  Graylon Good, RN BSN  05/29/2014 2:05 PM

## 2014-05-30 LAB — CBC
HCT: 42.4 % (ref 36.0–46.0)
Hemoglobin: 13.6 g/dL (ref 12.0–15.0)
MCH: 29.4 pg (ref 26.0–34.0)
MCHC: 32.1 g/dL (ref 30.0–36.0)
MCV: 91.6 fL (ref 78.0–100.0)
Platelets: 245 10*3/uL (ref 150–400)
RBC: 4.63 MIL/uL (ref 3.87–5.11)
RDW: 16.7 % — ABNORMAL HIGH (ref 11.5–15.5)
WBC: 7.4 10*3/uL (ref 4.0–10.5)

## 2014-05-30 LAB — GLUCOSE, CAPILLARY
Glucose-Capillary: 175 mg/dL — ABNORMAL HIGH (ref 70–99)
Glucose-Capillary: 221 mg/dL — ABNORMAL HIGH (ref 70–99)
Glucose-Capillary: 184 mg/dL — ABNORMAL HIGH (ref 70–99)
Glucose-Capillary: 188 mg/dL — ABNORMAL HIGH (ref 70–99)

## 2014-05-30 LAB — BASIC METABOLIC PANEL
Anion gap: 13 (ref 5–15)
BUN: 23 mg/dL (ref 6–23)
CO2: 27 mEq/L (ref 19–32)
Calcium: 9.4 mg/dL (ref 8.4–10.5)
Chloride: 103 mEq/L (ref 96–112)
Creatinine, Ser: 2.01 mg/dL — ABNORMAL HIGH (ref 0.50–1.10)
GFR calc Af Amer: 28 mL/min — ABNORMAL LOW (ref >90)
GFR calc non Af Amer: 24 mL/min — ABNORMAL LOW (ref >90)
Glucose, Bld: 165 mg/dL — ABNORMAL HIGH (ref 70–99)
Potassium: 3.7 mEq/L (ref 3.7–5.3)
Sodium: 143 mEq/L (ref 137–147)

## 2014-05-30 LAB — MAGNESIUM: Magnesium: 2 mg/dL (ref 1.5–2.5)

## 2014-05-30 MED ORDER — METOPROLOL TARTRATE 50 MG PO TABS
50.0000 mg | ORAL_TABLET | Freq: Three times a day (TID) | ORAL | Status: DC
  Administered 2014-05-30 – 2014-06-01 (×7): 50 mg via ORAL
  Filled 2014-05-30 (×9): qty 1

## 2014-05-30 NOTE — Progress Notes (Signed)
CARDIAC REHAB PHASE I   PRE:  Rate/Rhythm: 89 Afib  BP:  Sitting: Right Arm- 144/119, Left Arm- 150/109  Pt in recliner upon arrival.  Pt was hypertensive and did not attempt ambulation.  Cardiac rehab will follow-up Monday. 1135   Lillia Dallas MS, ACSM RCEP 11:33 AM 05/30/2014

## 2014-05-30 NOTE — Progress Notes (Signed)
Subjective:  Complains of mild shortness of breath today he is wearing oxygen. It remains in atrial fibrillation.  Objective:  Vital Signs in the last 24 hours: BP 158/98  Pulse 98  Temp(Src) 98 F (36.7 C) (Oral)  Resp 20  Ht 5\' 4"  (1.626 m)  Wt 101.424 kg (223 lb 9.6 oz)  BMI 38.36 kg/m2  SpO2 98%  Physical Exam: Obese black female currently in no acute distress Lungs:  Clear  Cardiac: Irregular rhythm, normal S1 and S2, no S3 Abdomen:  Soft, nontender, no masses Extremities:  No edema present  Intake/Output from previous day: 09/25 0701 - 09/26 0700 In: 840 [P.O.:840] Out: -  Weight Filed Weights   05/28/14 0512 05/29/14 0419 05/30/14 0300  Weight: 102.286 kg (225 lb 8 oz) 101.56 kg (223 lb 14.4 oz) 101.424 kg (223 lb 9.6 oz)   Lab Results: Basic Metabolic Panel:  Recent Labs  05/29/14 1034 05/30/14 0558  NA 141 143  K 3.9 3.7  CL 98 103  CO2 25 27  GLUCOSE 245* 165*  BUN 27* 23  CREATININE 2.04* 2.01*   CBC:  Recent Labs  05/30/14 0558  WBC 7.4  HGB 13.6  HCT 42.4  MCV 91.6  PLT 245    BNP    Component Value Date/Time   PROBNP 2123.0* 05/25/2014 1808    PROTIME: Lab Results  Component Value Date   INR 2.06* 03/07/2014   INR 2.12* 03/06/2014   INR 2.01* 03/05/2014    Telemetry: Atrial fibrillation rate mildly rapid at times but around 90  Assessment/Plan:  1. Junctional rhythm which is resolved 2. Atrial fibrillation with somewhat rapid response also 3. Acute on chronic renal failure improved 3. Chronic diastolic heart failure  Recommendations:  Increase beta blocker some at this point. Continue to watch over weekend.      Kerry Hough  MD [facility]  05/30/2014, 10:01 AM

## 2014-05-31 LAB — GLUCOSE, CAPILLARY
Glucose-Capillary: 149 mg/dL — ABNORMAL HIGH (ref 70–99)
Glucose-Capillary: 187 mg/dL — ABNORMAL HIGH (ref 70–99)
Glucose-Capillary: 208 mg/dL — ABNORMAL HIGH (ref 70–99)
Glucose-Capillary: 250 mg/dL — ABNORMAL HIGH (ref 70–99)

## 2014-05-31 LAB — BASIC METABOLIC PANEL
Anion gap: 14 (ref 5–15)
BUN: 25 mg/dL — ABNORMAL HIGH (ref 6–23)
CO2: 26 mEq/L (ref 19–32)
Calcium: 9.1 mg/dL (ref 8.4–10.5)
Chloride: 102 mEq/L (ref 96–112)
Creatinine, Ser: 2.15 mg/dL — ABNORMAL HIGH (ref 0.50–1.10)
GFR calc Af Amer: 26 mL/min — ABNORMAL LOW (ref >90)
GFR calc non Af Amer: 22 mL/min — ABNORMAL LOW (ref >90)
Glucose, Bld: 143 mg/dL — ABNORMAL HIGH (ref 70–99)
Potassium: 3.6 mEq/L — ABNORMAL LOW (ref 3.7–5.3)
Sodium: 142 mEq/L (ref 137–147)

## 2014-05-31 MED ORDER — HYDRALAZINE HCL 50 MG PO TABS
50.0000 mg | ORAL_TABLET | Freq: Four times a day (QID) | ORAL | Status: DC
  Administered 2014-05-31 – 2014-06-01 (×6): 50 mg via ORAL
  Filled 2014-05-31 (×8): qty 1

## 2014-05-31 NOTE — Plan of Care (Signed)
Problem: Phase II Progression Outcomes Goal: Anticoagulation Therapy per MD order Outcome: Completed/Met Date Met:  05/31/14 Pt taking eliquis

## 2014-05-31 NOTE — Progress Notes (Signed)
Subjective:  Continues with atrial fibrillation but is largely rate controlled with rates less than 100 on current regimen. Still some shortness of breath. Not ambulating because her blood pressure is elevated.  Objective:  Vital Signs in the last 24 hours: BP 134/108  Pulse 82  Temp(Src) 98 F (36.7 C) (Oral)  Resp 16  Ht 5\' 4"  (1.626 m)  Wt 102.74 kg (226 lb 8 oz)  BMI 38.86 kg/m2  SpO2 100%  Physical Exam: Obese black female currently in no acute distress Lungs:  Clear  Cardiac: Irregular rhythm, normal S1 and S2, no S3 Abdomen:  Soft, nontender, no masses Extremities:  No edema present  Intake/Output from previous day: 09/26 0701 - 09/27 0700 In: 633 [P.O.:600; I.V.:33] Out: -  Weight Filed Weights   05/29/14 0419 05/30/14 0300 05/31/14 0616  Weight: 101.56 kg (223 lb 14.4 oz) 101.424 kg (223 lb 9.6 oz) 102.74 kg (226 lb 8 oz)   Lab Results: Basic Metabolic Panel:  Recent Labs  05/30/14 0558 05/31/14 0509  NA 143 142  K 3.7 3.6*  CL 103 102  CO2 27 26  GLUCOSE 165* 143*  BUN 23 25*  CREATININE 2.01* 2.15*   CBC:  Recent Labs  05/30/14 0558  WBC 7.4  HGB 13.6  HCT 42.4  MCV 91.6  PLT 245    BNP    Component Value Date/Time   PROBNP 2123.0* 05/25/2014 1808    PROTIME: Lab Results  Component Value Date   INR 2.06* 03/07/2014   INR 2.12* 03/06/2014   INR 2.01* 03/05/2014    Telemetry: Atrial fibrillation rate mostly controlled around 90  Assessment/Plan:  1. Junctional rhythm which is resolved 2. Atrial fibrillation with somewhat rapid response also 3. Acute on chronic renal failure improved 3. Chronic diastolic heart failure 4. Hypertensive heart disease still elevated  Recommendations:  Increase beta blocker some at this point. Continue to watch over weekend. Increase hydralazine to help with blood pressure.      Kerry Hough  MD [facility]  05/31/2014, 9:19 AM

## 2014-06-01 ENCOUNTER — Telehealth: Payer: Self-pay | Admitting: Emergency Medicine

## 2014-06-01 DIAGNOSIS — G4733 Obstructive sleep apnea (adult) (pediatric): Secondary | ICD-10-CM

## 2014-06-01 LAB — BASIC METABOLIC PANEL
Anion gap: 14 (ref 5–15)
BUN: 28 mg/dL — ABNORMAL HIGH (ref 6–23)
CO2: 26 mEq/L (ref 19–32)
Calcium: 9.3 mg/dL (ref 8.4–10.5)
Chloride: 100 mEq/L (ref 96–112)
Creatinine, Ser: 2.25 mg/dL — ABNORMAL HIGH (ref 0.50–1.10)
GFR calc Af Amer: 25 mL/min — ABNORMAL LOW (ref >90)
GFR calc non Af Amer: 21 mL/min — ABNORMAL LOW (ref >90)
Glucose, Bld: 140 mg/dL — ABNORMAL HIGH (ref 70–99)
Potassium: 3.7 mEq/L (ref 3.7–5.3)
Sodium: 140 mEq/L (ref 137–147)

## 2014-06-01 LAB — GLUCOSE, CAPILLARY
Glucose-Capillary: 190 mg/dL — ABNORMAL HIGH (ref 70–99)
Glucose-Capillary: 249 mg/dL — ABNORMAL HIGH (ref 70–99)
Glucose-Capillary: 196 mg/dL — ABNORMAL HIGH (ref 70–99)
Glucose-Capillary: 229 mg/dL — ABNORMAL HIGH (ref 70–99)

## 2014-06-01 MED ORDER — HYDRALAZINE HCL 50 MG PO TABS
50.0000 mg | ORAL_TABLET | Freq: Three times a day (TID) | ORAL | Status: DC
  Administered 2014-06-01 – 2014-06-02 (×4): 50 mg via ORAL
  Filled 2014-06-01 (×7): qty 1

## 2014-06-01 MED ORDER — DILTIAZEM HCL ER COATED BEADS 180 MG PO CP24
180.0000 mg | ORAL_CAPSULE | Freq: Every day | ORAL | Status: DC
  Administered 2014-06-02 – 2014-06-03 (×2): 180 mg via ORAL
  Filled 2014-06-01 (×2): qty 1

## 2014-06-01 MED ORDER — METOPROLOL TARTRATE 50 MG PO TABS
75.0000 mg | ORAL_TABLET | Freq: Two times a day (BID) | ORAL | Status: DC
  Administered 2014-06-01 – 2014-06-02 (×2): 75 mg via ORAL
  Filled 2014-06-01 (×3): qty 1

## 2014-06-01 NOTE — Progress Notes (Signed)
CARDIAC REHAB PHASE I   PRE:  Rate/Rhythm: 66 Afib  BP:  Supine:   Sitting: 148/90  Standing:    SaO2: 98 2L  MODE:  Ambulation: 350 ft   POST:  Rate/Rhythm: 106 Afib  BP:  Supine:   Sitting: 140/86  Standing:    SaO2: 92 2L 1130-1200 Assisted X1 and used walker to ambulate. Gait steady with walker. Pt able to walk 350 feet without c/o. Pt back to recliner after walk with call light in reach. BP much better today.  Rodney Langton RN 06/01/2014 11:58 AM

## 2014-06-01 NOTE — Progress Notes (Signed)
Utilization review completed.  

## 2014-06-01 NOTE — Progress Notes (Signed)
Advanced Heart Failure Rounding Note   Subjective:    68 y/o woman with diastolic HF, CKD and PAF. Admitted with severe junctional bradycardia (HR 27-32) in setting of amio 200 daily/cardizem 240 daily/lopressor 75 bid. All AV nodal agents stopped and HR came up but now has gone back into AF. EP feels she hs failed amio. Recommend rate control efforts and if fails consider AF ablation.   Over the weekend BB increased. Weight up 2 lbs and 24 hr I/O +723 cc. Renal function trending up. Remains in Afib. Reports DOE but about at baseline for her. Denies CP, orthopnea or PND.   Objective:   Weight Range:  Vital Signs:   Temp:  [97.9 F (36.6 C)-98.6 F (37 C)] 97.9 F (36.6 C) (09/28 0620) Pulse Rate:  [63-105] 63 (09/28 0620) Resp:  [16-17] 16 (09/28 0620) BP: (129-146)/(69-102) 146/69 mmHg (09/28 0620) SpO2:  [98 %-100 %] 98 % (09/28 0620) Weight:  [228 lb 4.8 oz (103.556 kg)] 228 lb 4.8 oz (103.556 kg) (09/28 0640) Last BM Date: 05/31/14  Weight change: Filed Weights   05/30/14 0300 05/31/14 0616 06/01/14 0640  Weight: 223 lb 9.6 oz (101.424 kg) 226 lb 8 oz (102.74 kg) 228 lb 4.8 oz (103.556 kg)    Intake/Output:   Intake/Output Summary (Last 24 hours) at 06/01/14 1331 Last data filed at 06/01/14 0829  Gross per 24 hour  Intake    480 ml  Output      0 ml  Net    480 ml     Physical Exam: General: Sitting in chair NAD HEENT: normal  Neck: supple. JVP 7; Carotids 2+ bilat; no bruits. No lymphadenopathy or thryomegaly appreciate  Cor: PMI nonpalpbale. Distant. Irregular..  No obvious murmur  Lungs: Diminished in the bases  Abdomen: Obese soft, nontender. Non-distended. No hepatosplenomegaly. No bruits or masses. Good bowel sounds.  Extremities: no cyanosis, clubbing, rash, warm.No edema  Neuro: alert & orientedx3, cranial nerves grossly intact. moves all 4 extremities w/o difficulty. Affect    Telemetry:  Afib 70s  Labs: Basic Metabolic Panel:  Recent Labs Lab  05/25/14 1808  05/28/14 0630 05/29/14 1034 05/30/14 0558 05/31/14 0509 06/01/14 0435  NA 140  < > 142 141 143 142 140  K 3.1*  < > 3.6* 3.9 3.7 3.6* 3.7  CL 97  < > 101 98 103 102 100  CO2 27  < > 25 25 27 26 26   GLUCOSE 95  < > 120* 245* 165* 143* 140*  BUN 59*  < > 39* 27* 23 25* 28*  CREATININE 3.62*  < > 2.40* 2.04* 2.01* 2.15* 2.25*  CALCIUM 9.7  < > 9.5 9.8 9.4 9.1 9.3  MG 2.3  --   --   --  2.0  --   --   < > = values in this interval not displayed.  Liver Function Tests:  Recent Labs Lab 05/25/14 1808  AST 19  ALT 17  ALKPHOS 89  BILITOT 0.2*  PROT 7.8  ALBUMIN 3.5   No results found for this basename: LIPASE, AMYLASE,  in the last 168 hours No results found for this basename: AMMONIA,  in the last 168 hours  CBC:  Recent Labs Lab 05/25/14 1808 05/30/14 0558  WBC 8.7 7.4  NEUTROABS 4.2  --   HGB 12.9 13.6  HCT 39.7 42.4  MCV 91.1 91.6  PLT 260 245    Cardiac Enzymes: No results found for this basename: CKTOTAL, CKMB, CKMBINDEX, TROPONINI,  in the last 168 hours  BNP: BNP (last 3 results)  Recent Labs  03/03/14 1851 05/25/14 1808  PROBNP 1531.0* 2123.0*     Other results:    Imaging: No results found.   Medications:     Scheduled Medications: . amitriptyline  25 mg Oral QHS  . antiseptic oral rinse  7 mL Mouth Rinse BID  . apixaban  5 mg Oral BID  . atorvastatin  40 mg Oral q1800  . diltiazem  60 mg Oral 4 times per day  . febuxostat  40 mg Oral Daily  . hydrALAZINE  50 mg Oral 4 times per day  . insulin aspart  0-15 Units Subcutaneous TID WC & HS  . insulin glargine  40 Units Subcutaneous Daily  . metoprolol tartrate  50 mg Oral TID  . pantoprazole  40 mg Oral Daily  . sodium chloride  3 mL Intravenous Q12H  . 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 - improving 4) Hypokalemia 5) Afib/Aflutter - resolved  6) DM2   Plan/Discussion:    Remains in Afib with rate controlled. Will continue metoprolol 50 mg TID with diltiazem 60 mg QID.   Renal function trending up and patient reports she is not having much UOP and really dark. Her weight is up, however does not appear volume overloaded. Will check pro-BNP tomorrow.   Hopefully home tomorrow or Wednesday.   Length of Stay: 7 Rande Brunt NP-C 06/01/2014, 1:31 PM  Advanced Heart Failure Team Pager [phone] (M-F; 7a - 4p)  Please contact [facility] for night-coverage after hours (4p -7a ) and weekends on [email or web address]  Patient seen with NP, agree with the above note.  HR in 70s today on current regimen.  Reviewing notes, she has had bradycardia when in sinus but has not been bradycardic in atrial fibrillation.  Will plan rate control strategy at this point.  Consolidate metoprolol to 75 mg bid and diltiazem to diltiazem CD 180 mg daily.  If she is stable in am, may go home.   [name] 06/01/2014 6:07 PM

## 2014-06-01 NOTE — Progress Notes (Signed)
Inpatient Diabetes Program Recommendations  AACE/ADA: New Consensus Statement on Inpatient Glycemic Control (2013)  Target Ranges:  Prepandial:   less than 140 mg/dL      Peak postprandial:   less than 180 mg/dL (1-2 hours)      Critically ill patients:  140 - 180 mg/dL   Reason for Visit: Hyperglycemia  Results for [name], [name] (MRN [id number]) as of 06/01/2014 15:54  Ref. Range 05/31/2014 16:50 05/31/2014 20:01 06/01/2014 08:09 06/01/2014 11:41  Glucose-Capillary Latest Range: 70-99 mg/dL 208 (H) 250 (H) 190 (H) 249 (H)   Post-prandial blood sugars elevated.   Inpatient Diabetes Program Recommendations Insulin - Meal Coverage: Please consider adding Novolog 3 units tidwc for meal coverage insulin.   Note: Will continue to follow. Lorenda Peck, RD, LDN, CDE Inpatient Diabetes Coordinator [phone]

## 2014-06-01 NOTE — Telephone Encounter (Signed)
Per Dr. Lamonte Sakai please order O2 to be bled into the pt cpap, titrate for sats > 90%. Order placed. [id number] to advise the pt. [place] Bing, CMA

## 2014-06-02 ENCOUNTER — Encounter (HOSPITAL_COMMUNITY): Payer: Self-pay | Admitting: Anesthesiology

## 2014-06-02 LAB — BASIC METABOLIC PANEL
Anion gap: 14 (ref 5–15)
BUN: 32 mg/dL — ABNORMAL HIGH (ref 6–23)
CO2: 27 mEq/L (ref 19–32)
Calcium: 9.3 mg/dL (ref 8.4–10.5)
Chloride: 102 mEq/L (ref 96–112)
Creatinine, Ser: 2.36 mg/dL — ABNORMAL HIGH (ref 0.50–1.10)
GFR calc Af Amer: 23 mL/min — ABNORMAL LOW (ref >90)
GFR calc non Af Amer: 20 mL/min — ABNORMAL LOW (ref >90)
Glucose, Bld: 150 mg/dL — ABNORMAL HIGH (ref 70–99)
Potassium: 3.5 mEq/L — ABNORMAL LOW (ref 3.7–5.3)
Sodium: 143 mEq/L (ref 137–147)

## 2014-06-02 LAB — GLUCOSE, CAPILLARY
Glucose-Capillary: 148 mg/dL — ABNORMAL HIGH (ref 70–99)
Glucose-Capillary: 259 mg/dL — ABNORMAL HIGH (ref 70–99)
Glucose-Capillary: 183 mg/dL — ABNORMAL HIGH (ref 70–99)
Glucose-Capillary: 270 mg/dL — ABNORMAL HIGH (ref 70–99)

## 2014-06-02 MED ORDER — METOPROLOL TARTRATE 12.5 MG HALF TABLET
37.5000 mg | ORAL_TABLET | Freq: Two times a day (BID) | ORAL | Status: DC
  Administered 2014-06-02: 37.5 mg via ORAL
  Filled 2014-06-02 (×3): qty 1

## 2014-06-02 NOTE — Discharge Summary (Signed)
Advanced Heart Failure Team  Discharge Summary   Patient ID: [name] MRN: [id number], DOB/AGE: 02-27-1946 68 y.o. Admit date: 05/25/2014 D/C date:     06/03/2014    Primary Cardiologist: Dr. Einar Gip  PCP: Dr. [name]  Nephrologist: Dr. 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   She has struggled with atrial fibrillation and has not been able to be cardioverted d/t frequent episodes of aspiration pneumonia. Seen in the HF clinic on 05/25/14 with reports of being extremely dizzy, fatigued and increased SOB with ambulation. An EKG was ordered and she was in a junctional rhythm 32 bpm and it was decided to admit her and stop all her AV node blocking medications (cardizem and metoprolol). The patients HR dropped into the upper 20s and she was transferred to the ICU and was given glucagon and then started on IV glucagon to hopefully reverse AV nodal blocking agents. Her HR remained low and her amiodarone was held shortly, however once it started increasing was added back on. She remained in NSR for awhile but unfortunately she converted back into afib with RVR and EP was consulted to assess for PPM for tachy-brady syndrome. Dr. Rayann Heman saw the patient and recommened stopping amiodarone and restarting Cardizem which could be titrated for HR <100 bpm. If she does well with this strategy, then he preferred plan to pursue it long term. IF however she does not tolerate this  strategy then we could consider catheter ablation as an elective option down the road.  During her hospital stay she developed some headaches, dizziness and nausea. It was difficult to correlate with medications since she was receiving multiple medications. Her amiodarone was discontinued during her stay as well. She felt as if it was possibly related to her hydralazine so it was titrated down to 25 mg TID. The thought was that she may not be used to having lower blood pressure and that she just needs to get adjusted. Advised her to take Tylenol for headaches and continue to follow and to call us if it did not get better. On day of discharge she was doing well and VSS. Her rate was controlled but remained in atrial fibrillation. She was ambulating in the halls with PT and the plan was to send home with HHRN/PT and see her back in the clinic next Friday. We would like to see her sooner, however she is only able to come Fridays.   Discharge Weight Range: 226-230 lbs.  Discharge Vitals: Blood pressure 149/89, pulse 90, temperature 98 F (36.7 C), temperature source Oral, resp. rate 18, height 5\' 4"  (1.626 m), weight 226 lb 3.1 oz (102.6 kg), SpO2 100.00%.  Labs: Lab Results  Component Value Date   WBC 7.4 05/30/2014   HGB 13.6 05/30/2014   HCT 42.4 05/30/2014   MCV 91.6 05/30/2014   PLT 245 05/30/2014     Recent Labs Lab 06/03/14 0502  NA 142  K 3.4*  CL 102  CO2 27  BUN 29*  CREATININE  2.30*  CALCIUM 9.1  GLUCOSE 135*   No results found for this basename: CHOL,  HDL,  LDLCALC,  TRIG   BNP (last 3 results)  Recent Labs  03/03/14 1851 05/25/14 1808  PROBNP 1531.0* 2123.0*    Diagnostic Studies/Procedures   No results found.  Discharge Medications     Medication List    STOP taking these medications       amiodarone 200 MG tablet  Commonly known as:  PACERONE     amLODipine 10 MG tablet  Commonly known as:  NORVASC     cloNIDine 0.1 MG tablet  Commonly known as:   CATAPRES     methocarbamol 750 MG tablet  Commonly known as:  ROBAXIN-750      TAKE these medications       acetaminophen 500 MG tablet  Commonly known as:  TYLENOL  Take 500 mg by mouth every 6 (six) hours as needed for mild pain.     amitriptyline 25 MG tablet  Commonly known as:  ELAVIL  Take 25 mg by mouth at bedtime.     apixaban 5 MG Tabs tablet  Commonly known as:  ELIQUIS  Take 1 tablet (5 mg total) by mouth 2 (two) times daily.     atorvastatin 40 MG tablet  Commonly known as:  LIPITOR  Take 1 tablet (40 mg total) by mouth daily.     dicyclomine 10 MG capsule  Commonly known as:  BENTYL  Take 10 mg by mouth daily.     diltiazem 180 MG 24 hr capsule  Commonly known as:  CARDIZEM CD  Take 1 capsule (180 mg total) by mouth daily.     esomeprazole 40 MG capsule  Commonly known as:  NEXIUM  Take 1 capsule (40 mg total) by mouth daily at 12 noon.     febuxostat 40 MG tablet  Commonly known as:  ULORIC  Take 40 mg by mouth daily.     hydrALAZINE 25 MG tablet  Commonly known as:  APRESOLINE  Take 1 tablet (25 mg total) by mouth 3 (three) times daily.     insulin glargine 100 UNIT/ML injection  Commonly known as:  LANTUS  Inject 40 Units into the skin daily as needed.     insulin lispro 100 UNIT/ML injection  Commonly known as:  HUMALOG  Inject 0.2 mLs (20 Units total) into the skin 3 (three) times daily after meals.     Linaclotide 145 MCG Caps capsule  Commonly known as:  LINZESS  Take 290 mcg by mouth daily.     metoprolol tartrate 25 MG tablet  Commonly known as:  LOPRESSOR  Take 3 tablets (75 mg total) by mouth 2 (two) times daily.     nitroGLYCERIN 0.4 MG SL tablet  Commonly known as:  NITROSTAT  Place 0.4 mg under the tongue every 5 (five) minutes as needed for chest pain.     potassium chloride SA 20 MEQ tablet  Commonly known as:  K-DUR,KLOR-CON  Take 1 tablet (20 mEq total) by mouth 2 (two) times daily.     ranitidine 150 MG tablet   Commonly known as:  ZANTAC  Take 1 tablet (150 mg total) by mouth at bedtime.     torsemide 20 MG tablet  Commonly known as:  DEMADEX  Take 1 tablet (20 mg total) by mouth daily.     Vitamin D (Ergocalciferol) 50000 UNITS Caps capsule  Commonly known as:  DRISDOL  Take 50,000 Units by mouth  every Monday.     XOPENEX HFA 45 MCG/ACT inhaler  Generic drug:  levalbuterol  Inhale 2 puffs into the lungs every 4 (four) hours as needed for wheezing or shortness of breath.     zolpidem 10 MG tablet  Commonly known as:  AMBIEN  Take 10 mg by mouth at bedtime.        Disposition   The patient will be discharged in stable condition to home. Discharge Instructions   Beta Blocker already ordered    Complete by:  As directed      Contraindication to ACEI at discharge    Complete by:  As directed   CRI     Diet - low sodium heart healthy    Complete by:  As directed      Discharge instructions    Complete by:  As directed   1) Please review your medications closely.  2) Bring all medications to your visit.     Heart Failure patients record your daily weight using the same scale at the same time of day    Complete by:  As directed      Increase activity slowly    Complete by:  As directed      STOP any activity that causes chest pain, shortness of breath, dizziness, sweating, or exessive weakness    Complete by:  As directed           Follow-up Information   Follow up with [facility]. (Registered Nurse and Aide. )    Specialty:  [facility] information:   [address] [place] [address] [phone]       Follow up with [facility] On 06/12/2014. (@ 9:45; Gate Code 0500; Please bring all your medications to your visit. )    Specialty:  Cardiology   Contact information:   [id number] [place] [address] [phone]        Duration of Discharge  Encounter: Greater than 35 minutes   Signed, Rande Brunt NP-C 06/03/2014, 2:11 PM

## 2014-06-02 NOTE — Progress Notes (Signed)
Advanced Heart Failure Rounding Note   Subjective:    68 y/o woman with diastolic HF, CKD and PAF. Admitted with severe junctional bradycardia (HR 27-32) in setting of amio 200 daily/cardizem 240 daily/lopressor 75 bid. All AV nodal agents stopped and HR came up but now has gone back into AF. EP feels she hs failed amio. Recommend rate control efforts and if fails consider AF ablation.   Over the weekend BB increased. Weight down 2 lbs. Changed to diltiazem 180 CR yesterday and changed lopressor to 75 mg BID and hydralazine to 50 mg TID. Remains in Afib, rate controlled. Renal function up slightly.  Denies CP, orthopnea or PND.   Objective:   Weight Range:  Vital Signs:   Temp:  [97.7 F (36.5 C)-99.2 F (37.3 C)] 98.5 F (36.9 C) (09/29 0512) Pulse Rate:  [68-99] 68 (09/29 0512) Resp:  [14-16] 14 (09/29 0512) BP: (127-136)/(74-92) 127/74 mmHg (09/29 0512) SpO2:  [99 %-100 %] 99 % (09/29 0512) Weight:  [226 lb 4.8 oz (102.649 kg)] 226 lb 4.8 oz (102.649 kg) (09/29 0512) Last BM Date: 06/01/14  Weight change: Filed Weights   05/31/14 0616 06/01/14 0640 06/02/14 0512  Weight: 226 lb 8 oz (102.74 kg) 228 lb 4.8 oz (103.556 kg) 226 lb 4.8 oz (102.649 kg)    Intake/Output:   Intake/Output Summary (Last 24 hours) at 06/02/14 0958 Last data filed at 06/02/14 0831  Gross per 24 hour  Intake    500 ml  Output      0 ml  Net    500 ml     Physical Exam: General: Sitting in chair NAD HEENT: normal  Neck: supple. JVP 7; Carotids 2+ bilat; no bruits. No lymphadenopathy or thryomegaly appreciate  Cor: PMI nonpalpbale. Distant. Irregular..  No obvious murmur  Lungs: Diminished in the bases  Abdomen: Obese soft, nontender. Non-distended. No hepatosplenomegaly. No bruits or masses. Good bowel sounds.  Extremities: no cyanosis, clubbing, rash, warm.No edema  Neuro: alert & orientedx3, cranial nerves grossly intact. moves all 4 extremities w/o difficulty. Affect    Telemetry:  Afib  70s  Labs: Basic Metabolic Panel:  Recent Labs Lab 05/29/14 1034 05/30/14 0558 05/31/14 0509 06/01/14 0435 06/02/14 0540  NA 141 143 142 140 143  K 3.9 3.7 3.6* 3.7 3.5*  CL 98 103 102 100 102  CO2 25 27 26 26 27   GLUCOSE 245* 165* 143* 140* 150*  BUN 27* 23 25* 28* 32*  CREATININE 2.04* 2.01* 2.15* 2.25* 2.36*  CALCIUM 9.8 9.4 9.1 9.3 9.3  MG  --  2.0  --   --   --     Liver Function Tests: No results found for this basename: AST, ALT, ALKPHOS, BILITOT, PROT, ALBUMIN,  in the last 168 hours No results found for this basename: LIPASE, AMYLASE,  in the last 168 hours No results found for this basename: AMMONIA,  in the last 168 hours  CBC:  Recent Labs Lab 05/30/14 0558  WBC 7.4  HGB 13.6  HCT 42.4  MCV 91.6  PLT 245    Cardiac Enzymes: No results found for this basename: CKTOTAL, CKMB, CKMBINDEX, TROPONINI,  in the last 168 hours  BNP: BNP (last 3 results)  Recent Labs  03/03/14 1851 05/25/14 1808  PROBNP 1531.0* 2123.0*     Other results:    Imaging: No results found.   Medications:     Scheduled Medications: . amitriptyline  25 mg Oral QHS  . antiseptic oral rinse  7  mL Mouth Rinse BID  . apixaban  5 mg Oral BID  . atorvastatin  40 mg Oral q1800  . diltiazem  180 mg Oral Daily  . febuxostat  40 mg Oral Daily  . hydrALAZINE  50 mg Oral TID  . insulin aspart  0-15 Units Subcutaneous TID WC & HS  . insulin glargine  40 Units Subcutaneous Daily  . metoprolol tartrate  75 mg Oral BID  . pantoprazole  40 mg Oral Daily  . sodium chloride  3 mL Intravenous Q12H  . 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 -  improving 4) Hypokalemia 5) Afib/Aflutter - resolved  6) DM2   Plan/Discussion:    Remains in Afib with rate controlled. Will continue metoprolol 75 mg BID, diltiazem CD 180 mg daily and hydralazine 50 mg TID.    Renal function up slightly but around baseline. She does not appear volume overloaded. Will continue torsemide 20 mg daily.   Home today with follow up next week in HF clinic.   Length of Stay: 8 Rande Brunt NP-C 06/02/2014, 9:58 AM  Advanced Heart Failure Team Pager [phone] (M-F; 7a - 4p)  Please contact [facility] for night-coverage after hours (4p -7a ) and weekends on [email or web address]  Patient seen with NP, agree with the above note.  She appears euvolemic, HR in 70s on current regimen.   Reviewing notes, she has had bradycardia when in sinus but has not been bradycardic in atrial fibrillation. Will plan rate control strategy at this point.   She may go home today on current cardiac meds.   [name] 06/02/2014 10:07 AM

## 2014-06-02 NOTE — Progress Notes (Signed)
CARDIAC REHAB PHASE I   PRE:  Rate/Rhythm: 86 Afib  BP:  Supine:   Sitting: 122/78  Standing:    SaO2: 98 RA  MODE:  Ambulation: 350 ft   POST:  Rate/Rhythm: 95  BP:  Supine:   Sitting: 140/80  Standing:    SaO2: 94 2L 1445-1510 Assisted X 1 used walker and O2 2L to ambulate. Gait steady with walker. Pt able to walk 350 feet, she c/o of back and leg pain walking. Pt to recliner after walk with call light in reach. No c/o of any dizziness or nausea walking. BP much improved.  Rodney Langton RN 06/02/2014 3:14 PM

## 2014-06-03 DIAGNOSIS — I10 Essential (primary) hypertension: Secondary | ICD-10-CM

## 2014-06-03 LAB — BASIC METABOLIC PANEL
Anion gap: 13 (ref 5–15)
BUN: 29 mg/dL — ABNORMAL HIGH (ref 6–23)
CO2: 27 mEq/L (ref 19–32)
Calcium: 9.1 mg/dL (ref 8.4–10.5)
Chloride: 102 mEq/L (ref 96–112)
Creatinine, Ser: 2.3 mg/dL — ABNORMAL HIGH (ref 0.50–1.10)
GFR calc Af Amer: 24 mL/min — ABNORMAL LOW (ref >90)
GFR calc non Af Amer: 21 mL/min — ABNORMAL LOW (ref >90)
Glucose, Bld: 135 mg/dL — ABNORMAL HIGH (ref 70–99)
Potassium: 3.4 mEq/L — ABNORMAL LOW (ref 3.7–5.3)
Sodium: 142 mEq/L (ref 137–147)

## 2014-06-03 LAB — GLUCOSE, CAPILLARY
Glucose-Capillary: 160 mg/dL — ABNORMAL HIGH (ref 70–99)
Glucose-Capillary: 207 mg/dL — ABNORMAL HIGH (ref 70–99)

## 2014-06-03 MED ORDER — HYDRALAZINE HCL 25 MG PO TABS
25.0000 mg | ORAL_TABLET | Freq: Three times a day (TID) | ORAL | Status: DC
  Administered 2014-06-03: 25 mg via ORAL
  Filled 2014-06-03 (×3): qty 1

## 2014-06-03 MED ORDER — TORSEMIDE 20 MG PO TABS: 20.0000 mg | ORAL_TABLET | Freq: Every day | ORAL | Status: DC

## 2014-06-03 MED ORDER — POTASSIUM CHLORIDE CRYS ER 20 MEQ PO TBCR
40.0000 meq | EXTENDED_RELEASE_TABLET | Freq: Once | ORAL | Status: AC
  Administered 2014-06-03: 40 meq via ORAL
  Filled 2014-06-03: qty 2

## 2014-06-03 MED ORDER — CLONIDINE HCL 0.1 MG PO TABS
0.1000 mg | ORAL_TABLET | Freq: Three times a day (TID) | ORAL | Status: DC
  Administered 2014-06-03: 0.1 mg via ORAL
  Filled 2014-06-03 (×3): qty 1

## 2014-06-03 MED ORDER — METOPROLOL TARTRATE 50 MG PO TABS
75.0000 mg | ORAL_TABLET | Freq: Two times a day (BID) | ORAL | Status: DC
  Administered 2014-06-03: 75 mg via ORAL
  Filled 2014-06-03 (×2): qty 1

## 2014-06-03 MED ORDER — METOPROLOL TARTRATE 25 MG PO TABS: 75.0000 mg | ORAL_TABLET | Freq: Two times a day (BID) | ORAL | Status: DC

## 2014-06-03 MED ORDER — HYDRALAZINE HCL 25 MG PO TABS: 25.0000 mg | ORAL_TABLET | Freq: Three times a day (TID) | ORAL | Status: DC

## 2014-06-03 MED ORDER — DILTIAZEM HCL ER COATED BEADS 180 MG PO CP24: 180.0000 mg | ORAL_CAPSULE | Freq: Every day | ORAL | Status: DC

## 2014-06-03 NOTE — Progress Notes (Signed)
Advanced Heart Failure Rounding Note   Subjective:    68 y/o woman with diastolic HF, CKD and PAF. Admitted with severe junctional bradycardia (HR 27-32) in setting of amio 200 daily/cardizem 240 daily/lopressor 75 bid. All AV nodal agents stopped and HR came up but now has gone back into AF. EP feels she hs failed amio. Recommend rate control efforts and if fails consider AF ablation.   Over the weekend BB increased, however yesterday plan was to go home but she said he was having some nausea and dizziness. Lopressor cut back to 37.5 mg BID. Last night she reports she got dizzy, nausea and headache again around dinner time when she received hydralazine. Renal function stable. Weight down 7 lbs (liklely not accurate). Ambulating with CR. Denies CP, PND SOB or orthopnea. Not orthostatic.    Objective:   Weight Range:  Vital Signs:   Temp:  [97.8 F (36.6 C)-98.3 F (36.8 C)] 97.8 F (36.6 C) (09/30 0534) Pulse Rate:  [70-80] 75 (09/30 0534) Resp:  [17-18] 18 (09/30 0534) BP: (133-160)/(76-96) 141/76 mmHg (09/30 0534) SpO2:  [97 %-100 %] 97 % (09/30 0534) Weight:  [226 lb 3.1 oz (102.6 kg)-233 lb 9.6 oz (105.96 kg)] 226 lb 3.1 oz (102.6 kg) (09/30 0823) Last BM Date: 06/01/14  Weight change: Filed Weights   06/02/14 0512 06/03/14 0534 06/03/14 0823  Weight: 226 lb 4.8 oz (102.649 kg) 233 lb 9.6 oz (105.96 kg) 226 lb 3.1 oz (102.6 kg)    Intake/Output:   Intake/Output Summary (Last 24 hours) at 06/03/14 0829 Last data filed at 06/02/14 0831  Gross per 24 hour  Intake    240 ml  Output      0 ml  Net    240 ml     Physical Exam: General: Sitting on side of bed; NAD HEENT: normal  Neck: supple. JVP 7; Carotids 2+ bilat; no bruits. No lymphadenopathy or thryomegaly appreciate  Cor: PMI nonpalpbale. Distant. Irregular..  No obvious murmur  Lungs: Diminished in the bases  Abdomen: Obese soft, nontender. Non-distended. No hepatosplenomegaly. No bruits or masses. Good bowel  sounds.  Extremities: no cyanosis, clubbing, rash, warm.No edema  Neuro: alert & orientedx3, cranial nerves grossly intact. moves all 4 extremities w/o difficulty. Affect    Telemetry:  Afib 100s  Labs: Basic Metabolic Panel:  Recent Labs Lab 05/30/14 0558 05/31/14 0509 06/01/14 0435 06/02/14 0540 06/03/14 0502  NA 143 142 140 143 142  K 3.7 3.6* 3.7 3.5* 3.4*  CL 103 102 100 102 102  CO2 27 26 26 27 27   GLUCOSE 165* 143* 140* 150* 135*  BUN 23 25* 28* 32* 29*  CREATININE 2.01* 2.15* 2.25* 2.36* 2.30*  CALCIUM 9.4 9.1 9.3 9.3 9.1  MG 2.0  --   --   --   --     Liver Function Tests: No results found for this basename: AST, ALT, ALKPHOS, BILITOT, PROT, ALBUMIN,  in the last 168 hours No results found for this basename: LIPASE, AMYLASE,  in the last 168 hours No results found for this basename: AMMONIA,  in the last 168 hours  CBC:  Recent Labs Lab 05/30/14 0558  WBC 7.4  HGB 13.6  HCT 42.4  MCV 91.6  PLT 245    Cardiac Enzymes: No results found for this basename: CKTOTAL, CKMB, CKMBINDEX, TROPONINI,  in the last 168 hours  BNP: BNP (last 3 results)  Recent Labs  03/03/14 1851 05/25/14 1808  PROBNP 1531.0* 2123.0*  Other results:    Imaging: No results found.   Medications:     Scheduled Medications: . amitriptyline  25 mg Oral QHS  . antiseptic oral rinse  7 mL Mouth Rinse BID  . apixaban  5 mg Oral BID  . atorvastatin  40 mg Oral q1800  . diltiazem  180 mg Oral Daily  . febuxostat  40 mg Oral Daily  . hydrALAZINE  50 mg Oral TID  . insulin aspart  0-15 Units Subcutaneous TID WC & HS  . insulin glargine  40 Units Subcutaneous Daily  . metoprolol tartrate  37.5 mg Oral BID  . pantoprazole  40 mg Oral Daily  . sodium chloride  3 mL Intravenous Q12H  . 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 - improving 4) Hypokalemia 5) Afib/Aflutter - resolved  6) DM2   Plan/Discussion:    Remains in Afib now without rate control. We titrated back lopressor yesterday d/t vague symptoms of nausea, headache and dizziness that she reports started about a week ago (9/23). Will increase lopressor back 75 mg BID and continue diltiazem CD 180 mg daily. Will cut hydralazine back to 25 mg TID.     Renal function stable. Supplement potassium   Hopefully home today and will tell her to take Tylenol right now for headaches and see it helps.  Length of Stay: 9 Rande Brunt NP-C 06/03/2014, 8:29 AM  Advanced Heart Failure Team Pager [phone] (M-F; 7a - 4p)  Please contact [facility] for night-coverage after hours (4p -7a ) and weekends on [email or web address]  Patient seen with NP, agree with the above note.  She looks OK HR in 80s. SBP stable. The question will be if she can tolerate rate control of AF long-term. [facility] for d/c today with HHRN and HHPT. She cannot follow up early next week can only make it next Friday. I feel that is ok. Agree with cutting back hydralazine a bit. OK to le BP run just a bit.   Daniel Bensimhon,MD 11:06 AM

## 2014-06-03 NOTE — Telephone Encounter (Signed)
LMTCB x 1 

## 2014-06-03 NOTE — Progress Notes (Signed)
Transitional Care Clinic:  This Case Manager reviewed patient's EMR and determined patient would benefit from chronic care management services through the [facility]. This Case Manager met with patient to discuss the services and medical management that can be provided at the [facility].  Patient declined follow-up with the [facility] and indicates she has a follow-up appointment already scheduled with her Cardiologist and PCP.  Transitional Care Clinic location and contact number left with patient. Patient appreciative of information.

## 2014-06-03 NOTE — Progress Notes (Signed)
Discharge instructions given. Pt verbalized understanding and all questions were answered.  

## 2014-06-04 ENCOUNTER — Encounter: Payer: Self-pay | Admitting: *Deleted

## 2014-06-04 ENCOUNTER — Telehealth (HOSPITAL_COMMUNITY): Payer: Self-pay | Admitting: Vascular Surgery

## 2014-06-04 NOTE — Telephone Encounter (Signed)
Spoke with pt and advised of Dr Agustina Caroli recommendations to wear oxygen with cpap at night.  PT states she received something in the mail and wasn't sure if it was something to hook cpap with oxygen.  I spoke with [place] at [facility] and she will have a RT give pt a call to explain how to use adapter with oxygen and  cpap.

## 2014-06-04 NOTE — Telephone Encounter (Signed)
Per Darrick Grinder, NP we are not comfortable prescribing pain med, Jenny Reichmann is aware and will advise pt to got ER or urgent care if pain is that bad

## 2014-06-04 NOTE — Telephone Encounter (Signed)
Pt returned call.  Holly D Pryor ° °

## 2014-06-04 NOTE — Telephone Encounter (Signed)
Nurse from [facility] called pt is having arthritis pain in knees.. Pt has been in out the hospital and Dr. Berenice Primas hasn't been able to see her he denied the medication until he sees her next week.. Nurse feels the pt cant wait til next week to get pain med.. Nurse states she was getting Percocet in the hospital.. Please advise

## 2014-06-05 ENCOUNTER — Inpatient Hospital Stay (HOSPITAL_COMMUNITY): Payer: Commercial Managed Care - HMO

## 2014-06-09 ENCOUNTER — Inpatient Hospital Stay (HOSPITAL_COMMUNITY)
Admission: EM | Admit: 2014-06-09 | Discharge: 2014-06-12 | DRG: 286 | Disposition: A | Discharge status: Home / Self Care | Payer: Medicare HMO | Attending: Internal Medicine | Admitting: Internal Medicine

## 2014-06-09 ENCOUNTER — Emergency Department (HOSPITAL_COMMUNITY): Payer: Medicare HMO

## 2014-06-09 ENCOUNTER — Telehealth (HOSPITAL_COMMUNITY): Payer: Self-pay | Admitting: *Deleted

## 2014-06-09 ENCOUNTER — Encounter (HOSPITAL_COMMUNITY): Payer: Self-pay | Admitting: Emergency Medicine

## 2014-06-09 DIAGNOSIS — Z91048 Other nonmedicinal substance allergy status: Secondary | ICD-10-CM

## 2014-06-09 DIAGNOSIS — G4730 Sleep apnea, unspecified: Secondary | ICD-10-CM | POA: Diagnosis present

## 2014-06-09 DIAGNOSIS — Z6838 Body mass index (BMI) 38.0-38.9, adult: Secondary | ICD-10-CM

## 2014-06-09 DIAGNOSIS — N184 Chronic kidney disease, stage 4 (severe): Secondary | ICD-10-CM

## 2014-06-09 DIAGNOSIS — I5033 Acute on chronic diastolic (congestive) heart failure: Secondary | ICD-10-CM | POA: Diagnosis not present

## 2014-06-09 DIAGNOSIS — J9621 Acute and chronic respiratory failure with hypoxia: Secondary | ICD-10-CM

## 2014-06-09 DIAGNOSIS — J9620 Acute and chronic respiratory failure, unspecified whether with hypoxia or hypercapnia: Secondary | ICD-10-CM

## 2014-06-09 DIAGNOSIS — E785 Hyperlipidemia, unspecified: Secondary | ICD-10-CM | POA: Diagnosis present

## 2014-06-09 DIAGNOSIS — G4733 Obstructive sleep apnea (adult) (pediatric): Secondary | ICD-10-CM

## 2014-06-09 DIAGNOSIS — R0600 Dyspnea, unspecified: Secondary | ICD-10-CM

## 2014-06-09 DIAGNOSIS — I4819 Other persistent atrial fibrillation: Secondary | ICD-10-CM

## 2014-06-09 DIAGNOSIS — Z881 Allergy status to other antibiotic agents status: Secondary | ICD-10-CM

## 2014-06-09 DIAGNOSIS — J9611 Chronic respiratory failure with hypoxia: Secondary | ICD-10-CM

## 2014-06-09 DIAGNOSIS — Z9981 Dependence on supplemental oxygen: Secondary | ICD-10-CM

## 2014-06-09 DIAGNOSIS — K2270 Barrett's esophagus without dysplasia: Secondary | ICD-10-CM | POA: Diagnosis present

## 2014-06-09 DIAGNOSIS — I739 Peripheral vascular disease, unspecified: Secondary | ICD-10-CM | POA: Diagnosis present

## 2014-06-09 DIAGNOSIS — E1122 Type 2 diabetes mellitus with diabetic chronic kidney disease: Secondary | ICD-10-CM

## 2014-06-09 DIAGNOSIS — I4891 Unspecified atrial fibrillation: Secondary | ICD-10-CM

## 2014-06-09 DIAGNOSIS — I495 Sick sinus syndrome: Secondary | ICD-10-CM | POA: Diagnosis present

## 2014-06-09 DIAGNOSIS — Z79899 Other long term (current) drug therapy: Secondary | ICD-10-CM

## 2014-06-09 DIAGNOSIS — Z7901 Long term (current) use of anticoagulants: Secondary | ICD-10-CM

## 2014-06-09 DIAGNOSIS — Z87891 Personal history of nicotine dependence: Secondary | ICD-10-CM

## 2014-06-09 DIAGNOSIS — Z794 Long term (current) use of insulin: Secondary | ICD-10-CM

## 2014-06-09 DIAGNOSIS — R0602 Shortness of breath: Secondary | ICD-10-CM | POA: Diagnosis not present

## 2014-06-09 DIAGNOSIS — Z885 Allergy status to narcotic agent status: Secondary | ICD-10-CM

## 2014-06-09 DIAGNOSIS — J189 Pneumonia, unspecified organism: Secondary | ICD-10-CM

## 2014-06-09 DIAGNOSIS — I481 Persistent atrial fibrillation: Secondary | ICD-10-CM | POA: Diagnosis present

## 2014-06-09 DIAGNOSIS — Z96651 Presence of right artificial knee joint: Secondary | ICD-10-CM | POA: Diagnosis present

## 2014-06-09 DIAGNOSIS — J45909 Unspecified asthma, uncomplicated: Secondary | ICD-10-CM | POA: Diagnosis present

## 2014-06-09 DIAGNOSIS — Z955 Presence of coronary angioplasty implant and graft: Secondary | ICD-10-CM

## 2014-06-09 DIAGNOSIS — Z880 Allergy status to penicillin: Secondary | ICD-10-CM

## 2014-06-09 DIAGNOSIS — K219 Gastro-esophageal reflux disease without esophagitis: Secondary | ICD-10-CM | POA: Diagnosis present

## 2014-06-09 DIAGNOSIS — I129 Hypertensive chronic kidney disease with stage 1 through stage 4 chronic kidney disease, or unspecified chronic kidney disease: Secondary | ICD-10-CM | POA: Diagnosis present

## 2014-06-09 DIAGNOSIS — I252 Old myocardial infarction: Secondary | ICD-10-CM

## 2014-06-09 LAB — CBC
HCT: 39.9 % (ref 36.0–46.0)
Hemoglobin: 12.8 g/dL (ref 12.0–15.0)
MCH: 29.4 pg (ref 26.0–34.0)
MCHC: 32.1 g/dL (ref 30.0–36.0)
MCV: 91.5 fL (ref 78.0–100.0)
Platelets: 268 10*3/uL (ref 150–400)
RBC: 4.36 MIL/uL (ref 3.87–5.11)
RDW: 16.9 % — ABNORMAL HIGH (ref 11.5–15.5)
WBC: 8.5 10*3/uL (ref 4.0–10.5)

## 2014-06-09 LAB — BASIC METABOLIC PANEL
Anion gap: 14 (ref 5–15)
BUN: 17 mg/dL (ref 6–23)
CO2: 21 mEq/L (ref 19–32)
Calcium: 9.4 mg/dL (ref 8.4–10.5)
Chloride: 105 mEq/L (ref 96–112)
Creatinine, Ser: 2.23 mg/dL — ABNORMAL HIGH (ref 0.50–1.10)
GFR calc Af Amer: 25 mL/min — ABNORMAL LOW (ref >90)
GFR calc non Af Amer: 21 mL/min — ABNORMAL LOW (ref >90)
Glucose, Bld: 101 mg/dL — ABNORMAL HIGH (ref 70–99)
Potassium: 4.4 mEq/L (ref 3.7–5.3)
Sodium: 140 mEq/L (ref 137–147)

## 2014-06-09 LAB — I-STAT TROPONIN, ED: Troponin i, poc: 0.02 ng/mL (ref 0.00–0.08)

## 2014-06-09 LAB — PROTIME-INR
INR: 1.28 (ref 0.00–1.49)
Prothrombin Time: 16 seconds — ABNORMAL HIGH (ref 11.6–15.2)

## 2014-06-09 LAB — PRO B NATRIURETIC PEPTIDE: Pro B Natriuretic peptide (BNP): 2594 pg/mL — ABNORMAL HIGH (ref 0–125)

## 2014-06-09 LAB — GLUCOSE, CAPILLARY: Glucose-Capillary: 259 mg/dL — ABNORMAL HIGH (ref 70–99)

## 2014-06-09 LAB — TROPONIN I: Troponin I: <0.3 ng/mL (ref <0.30)

## 2014-06-09 MED ORDER — APIXABAN 5 MG PO TABS
5.0000 mg | ORAL_TABLET | Freq: Two times a day (BID) | ORAL | Status: DC
  Administered 2014-06-09 – 2014-06-11 (×4): 5 mg via ORAL
  Filled 2014-06-09 (×5): qty 1

## 2014-06-09 MED ORDER — FAMOTIDINE 20 MG PO TABS
20.0000 mg | ORAL_TABLET | Freq: Every day | ORAL | Status: DC
  Administered 2014-06-09 – 2014-06-11 (×3): 20 mg via ORAL
  Filled 2014-06-09 (×4): qty 1

## 2014-06-09 MED ORDER — ATORVASTATIN CALCIUM 40 MG PO TABS
40.0000 mg | ORAL_TABLET | Freq: Every day | ORAL | Status: DC
  Administered 2014-06-09 – 2014-06-11 (×3): 40 mg via ORAL
  Filled 2014-06-09 (×4): qty 1

## 2014-06-09 MED ORDER — DILTIAZEM HCL ER COATED BEADS 360 MG PO CP24
360.0000 mg | ORAL_CAPSULE | Freq: Every day | ORAL | Status: DC
  Administered 2014-06-10 – 2014-06-11 (×2): 360 mg via ORAL
  Filled 2014-06-09 (×3): qty 1

## 2014-06-09 MED ORDER — ACETAMINOPHEN 500 MG PO TABS
500.0000 mg | ORAL_TABLET | Freq: Four times a day (QID) | ORAL | Status: DC | PRN
  Administered 2014-06-10 – 2014-06-11 (×2): 500 mg via ORAL
  Filled 2014-06-09 (×2): qty 1

## 2014-06-09 MED ORDER — FEBUXOSTAT 40 MG PO TABS
40.0000 mg | ORAL_TABLET | Freq: Every day | ORAL | Status: DC
  Administered 2014-06-09 – 2014-06-11 (×3): 40 mg via ORAL
  Filled 2014-06-09 (×4): qty 1

## 2014-06-09 MED ORDER — DILTIAZEM HCL 100 MG IV SOLR: 5.0000 mg/h | INTRAVENOUS | Status: DC

## 2014-06-09 MED ORDER — LINACLOTIDE 290 MCG PO CAPS
290.0000 ug | ORAL_CAPSULE | Freq: Every day | ORAL | Status: DC
  Administered 2014-06-10 – 2014-06-11 (×2): 290 ug via ORAL
  Filled 2014-06-09 (×3): qty 1

## 2014-06-09 MED ORDER — ZOLPIDEM TARTRATE 5 MG PO TABS: 10.0000 mg | ORAL_TABLET | Freq: Every day | ORAL | Status: DC

## 2014-06-09 MED ORDER — HYDRALAZINE HCL 25 MG PO TABS
25.0000 mg | ORAL_TABLET | Freq: Three times a day (TID) | ORAL | Status: DC
  Administered 2014-06-09 – 2014-06-12 (×8): 25 mg via ORAL
  Filled 2014-06-09 (×10): qty 1

## 2014-06-09 MED ORDER — AMITRIPTYLINE HCL 25 MG PO TABS
25.0000 mg | ORAL_TABLET | Freq: Every day | ORAL | Status: DC
  Administered 2014-06-09 – 2014-06-11 (×3): 25 mg via ORAL
  Filled 2014-06-09 (×4): qty 1

## 2014-06-09 MED ORDER — NITROGLYCERIN 0.4 MG SL SUBL: 0.4000 mg | SUBLINGUAL_TABLET | SUBLINGUAL | Status: DC | PRN

## 2014-06-09 MED ORDER — INSULIN ASPART 100 UNIT/ML ~~LOC~~ SOLN
0.0000 [IU] | Freq: Three times a day (TID) | SUBCUTANEOUS | Status: DC
  Administered 2014-06-10 (×3): 4 [IU] via SUBCUTANEOUS
  Administered 2014-06-11: 3 [IU] via SUBCUTANEOUS
  Administered 2014-06-11: 7 [IU] via SUBCUTANEOUS
  Administered 2014-06-11: 11 [IU] via SUBCUTANEOUS
  Administered 2014-06-12: 7 [IU] via SUBCUTANEOUS

## 2014-06-09 MED ORDER — METOPROLOL TARTRATE 50 MG PO TABS
75.0000 mg | ORAL_TABLET | Freq: Two times a day (BID) | ORAL | Status: DC
  Administered 2014-06-09: 75 mg via ORAL
  Filled 2014-06-09 (×3): qty 1

## 2014-06-09 MED ORDER — PANTOPRAZOLE SODIUM 40 MG PO TBEC
80.0000 mg | DELAYED_RELEASE_TABLET | Freq: Every day | ORAL | Status: DC
  Administered 2014-06-10 – 2014-06-12 (×3): 80 mg via ORAL
  Filled 2014-06-09 (×2): qty 2

## 2014-06-09 MED ORDER — DILTIAZEM HCL ER COATED BEADS 240 MG PO CP24
240.0000 mg | ORAL_CAPSULE | Freq: Every day | ORAL | Status: DC
  Administered 2014-06-09: 240 mg via ORAL
  Filled 2014-06-09: qty 1

## 2014-06-09 MED ORDER — FUROSEMIDE 10 MG/ML IJ SOLN
80.0000 mg | Freq: Two times a day (BID) | INTRAMUSCULAR | Status: DC
  Administered 2014-06-09 – 2014-06-12 (×6): 80 mg via INTRAVENOUS
  Filled 2014-06-09 (×8): qty 8

## 2014-06-09 MED ORDER — POTASSIUM CHLORIDE CRYS ER 20 MEQ PO TBCR
20.0000 meq | EXTENDED_RELEASE_TABLET | Freq: Two times a day (BID) | ORAL | Status: DC
  Administered 2014-06-09 – 2014-06-11 (×5): 20 meq via ORAL
  Filled 2014-06-09 (×7): qty 1

## 2014-06-09 MED ORDER — DILTIAZEM LOAD VIA INFUSION
20.0000 mg | Freq: Once | INTRAVENOUS | Status: AC
  Administered 2014-06-09: 20 mg via INTRAVENOUS
  Filled 2014-06-09: qty 20

## 2014-06-09 MED ORDER — ACETAMINOPHEN 325 MG PO TABS
650.0000 mg | ORAL_TABLET | Freq: Once | ORAL | Status: AC
  Administered 2014-06-09: 650 mg via ORAL
  Filled 2014-06-09: qty 2

## 2014-06-09 MED ORDER — DILTIAZEM HCL ER COATED BEADS 120 MG PO TB24
240.0000 mg | ORAL_TABLET | Freq: Every day | ORAL | Status: DC
  Filled 2014-06-09: qty 2

## 2014-06-09 MED ORDER — DICYCLOMINE HCL 10 MG PO CAPS
10.0000 mg | ORAL_CAPSULE | Freq: Every day | ORAL | Status: DC
  Administered 2014-06-10 – 2014-06-11 (×2): 10 mg via ORAL
  Filled 2014-06-09 (×3): qty 1

## 2014-06-09 MED ORDER — METOPROLOL TARTRATE 25 MG PO TABS
75.0000 mg | ORAL_TABLET | Freq: Once | ORAL | Status: AC
  Administered 2014-06-09: 75 mg via ORAL
  Filled 2014-06-09: qty 3

## 2014-06-09 MED ORDER — LEVALBUTEROL TARTRATE 45 MCG/ACT IN AERO: 2.0000 | INHALATION_SPRAY | RESPIRATORY_TRACT | Status: DC | PRN

## 2014-06-09 MED ORDER — LEVALBUTEROL HCL 0.63 MG/3ML IN NEBU: 0.6300 mg | INHALATION_SOLUTION | RESPIRATORY_TRACT | Status: DC | PRN

## 2014-06-09 MED ORDER — FUROSEMIDE 10 MG/ML IJ SOLN
80.0000 mg | Freq: Once | INTRAMUSCULAR | Status: AC
  Administered 2014-06-09: 80 mg via INTRAVENOUS
  Filled 2014-06-09: qty 8

## 2014-06-09 MED ORDER — ZOLPIDEM TARTRATE 5 MG PO TABS
5.0000 mg | ORAL_TABLET | Freq: Every day | ORAL | Status: DC
  Administered 2014-06-09 – 2014-06-11 (×3): 5 mg via ORAL
  Filled 2014-06-09 (×3): qty 1

## 2014-06-09 MED ORDER — ONDANSETRON HCL 4 MG/2ML IJ SOLN: 4.0000 mg | Freq: Four times a day (QID) | INTRAMUSCULAR | Status: DC | PRN

## 2014-06-09 NOTE — ED Notes (Signed)
MD at bedside. 

## 2014-06-09 NOTE — Consult Note (Signed)
Advanced Heart Failure Team Consult Note    HPI:    [name] is a 67 yo female with a history of HTN, HLD, bronchial asthma on home 3L O2, DM2, CKD stage IV, atrial fibrillation (s/p aflutter ablation in 2008), tachy-brady syndrome and diastolic HF.   Admitted in 7/15 due to aspiration PNA after elective DC-CV with Dr. Einar Gip. While in hospital reverted back to AF and rate control strategy pursued. However, had trouble tolerating AF so was kept on amio with plans for eventual DC-CV  .Seen in the HF clinic on 05/25/14 with reports of being extremely dizzy, fatigued and increased SOB with ambulation. Found to have junctional rhythm with rates 27-32 bpm. She initially converted to NSR but then went back to AF with RVR. EP was consulted to assess for PPM for tachy-brady syndrome. Dr. Rayann Heman saw the patient and felt she failed amio and suggested another trial of rate control. She was discharged on lopressor 75 bid and diltiazem 180 daily. Demadex cut to 20 daily. D/cweight 226 pounds.  Weight has gone up a few pounds over the past few days. This am developed respiratory distress with CP and sats into 80s. EMS called. HR 120.   In ER CXR + mild CHF. pBNP 2594 (justl slightly up from previous). Rate controlled with IV diltiazem.    Review of Systems: [y] = yes, [ ]  = no   General: Weight gain Blue.Reese ]; Weight loss [ ] ; Anorexia [ ] ; Fatigue [ y]; Fever [ ] ; Chills [ ] ; Weakness [ ]   Cardiac: Chest pain/pressure Blue.Reese ]; Resting SOB Blue.Reese ]; Exertional SOB Blue.Reese ]; Orthopnea [ ] ; Pedal Edema [ y]; Palpitations [ ] ; Syncope [ ] ; Presyncope [ ] ; Paroxysmal nocturnal dyspnea[ ]   Pulmonary: Cough Blue.Reese ]; Wheezing[ ] ; Hemoptysis[ ] ; Sputum [ ] ; Snoring [ ]   GI: Vomiting[ ] ; Dysphagia[ ] ; Melena[ ] ; Hematochezia [ ] ; Heartburn[ ] ; Abdominal pain [ ] ; Constipation [ ] ; Diarrhea [ ] ; BRBPR [ ]   GU: Hematuria[ ] ; Dysuria [ ] ; Nocturia[ ]   Vascular: Pain in legs with walking [ ] ; Pain in feet with lying flat [ ] ; Non-healing  sores [ ] ; Stroke [ ] ; TIA [ ] ; Slurred speech [ ] ;  Neuro: Headaches[ ] ; Vertigo[ ] ; Seizures[ ] ; Paresthesias[ ] ;Blurred vision [ ] ; Diplopia [ ] ; Vision changes [ ]   Ortho/Skin: Arthritis Blue.Reese ]; Joint pain Blue.Reese ]; Muscle pain [ ] ; Joint swelling [ ] ; Back Pain [ ] ; Rash [ ]   Psych: Depression[ ] ; Anxiety[ ]   Heme: Bleeding problems [ ] ; Clotting disorders [ ] ; Anemia [ ]   Endocrine: Diabetes Blue.Reese ]; Thyroid dysfunction[ ]   Home Medications Prior to Admission medications   Medication Sig Start Date End Date Taking? Authorizing Provider  acetaminophen (TYLENOL) 500 MG tablet Take 500 mg by mouth every 6 (six) hours as needed for mild pain.   Yes Historical Provider, MD  amitriptyline (ELAVIL) 25 MG tablet Take 25 mg by mouth at bedtime.     Yes Historical Provider, MD  apixaban (ELIQUIS) 5 MG TABS tablet Take 1 tablet (5 mg total) by mouth 2 (two) times daily. 04/29/14  Yes Jolaine Artist, MD  atorvastatin (LIPITOR) 40 MG tablet Take 1 tablet (40 mg total) by mouth daily. 04/29/14  Yes Jolaine Artist, MD  dicyclomine (BENTYL) 10 MG capsule Take 10 mg by mouth daily.    Yes Historical Provider, MD  diltiazem (CARDIZEM CD) 180 MG 24 hr capsule Take 1 capsule (180 mg total) by  mouth daily. 06/03/14  Yes Rande Brunt, NP  esomeprazole (NEXIUM) 40 MG capsule Take 1 capsule (40 mg total) by mouth daily at 12 noon. 04/29/14  Yes Jolaine Artist, MD  febuxostat (ULORIC) 40 MG tablet Take 40 mg by mouth daily.    Yes Historical Provider, MD  hydrALAZINE (APRESOLINE) 25 MG tablet Take 1 tablet (25 mg total) by mouth 3 (three) times daily. 06/03/14  Yes Rande Brunt, NP  insulin glargine (LANTUS) 100 UNIT/ML injection Inject 40 Units into the skin daily as needed. 12/23/13  Yes Delfina Redwood, MD  insulin lispro (HUMALOG) 100 UNIT/ML injection Inject 0.2 mLs (20 Units total) into the skin 3 (three) times daily after meals. 12/23/13  Yes Delfina Redwood, MD  Linaclotide Rolan Lipa) 145 MCG CAPS  capsule Take 290 mcg by mouth daily.   Yes Historical Provider, MD  metoprolol tartrate (LOPRESSOR) 25 MG tablet Take 3 tablets (75 mg total) by mouth 2 (two) times daily. 06/03/14  Yes Rande Brunt, NP  potassium chloride SA (K-DUR,KLOR-CON) 20 MEQ tablet Take 1 tablet (20 mEq total) by mouth 2 (two) times daily. 04/02/14  Yes Jolaine Artist, MD  ranitidine (ZANTAC) 150 MG tablet Take 1 tablet (150 mg total) by mouth at bedtime. 04/29/14  Yes Jolaine Artist, MD  torsemide (DEMADEX) 20 MG tablet Take 1 tablet (20 mg total) by mouth daily. 06/03/14  Yes Rande Brunt, NP  Vitamin D, Ergocalciferol, (DRISDOL) 50000 UNITS CAPS capsule Take 50,000 Units by mouth every Monday.   Yes Historical Provider, MD  XOPENEX HFA 45 MCG/ACT inhaler Inhale 2 puffs into the lungs every 4 (four) hours as needed for wheezing or shortness of breath. 03/11/14  Yes Marijean Heath, NP  zolpidem (AMBIEN) 10 MG tablet Take 10 mg by mouth at bedtime.    Yes Historical Provider, MD  nitroGLYCERIN (NITROSTAT) 0.4 MG SL tablet Place 0.4 mg under the tongue every 5 (five) minutes as needed for chest pain.     Historical Provider, MD    Past Medical History: Past Medical History  Diagnosis Date  . Atrial flutter     ablated by Dr Lovena Le in 2008  . Hypertension   . Diabetes mellitus   . Diastolic heart failure     a. EF 60-65%, RV nl (03/2014)  . Asthma   . Hyperlipidemia   . Fatty liver   . Esophageal dysmotility   . Arthritis   . Sleep apnea     wears CPAP  . Fatty tumor fatty tumor back  . Coronary atherosclerosis of native coronary artery   . Morbid obesity   . Myocardial infarction 2009  . Heart murmur   . Peripheral vascular disease   . GERD (gastroesophageal reflux disease)     barrets esophagus  . Anginal pain     occ; non-ischemic Lexiscan 09/2012  . Kidney disease     CKD stage IV (Dr. Erling Cruz)  . Complication of anesthesia     " DIFFICULTY BREATHING "  . Persistent atrial  fibrillation   . Tachycardia-bradycardia   . [name] esophagus   . Hiatal hernia   . Esophageal dysmotilities     Past Surgical History: Past Surgical History  Procedure Laterality Date  . Coronary angioplasty with stent placement    . Breast lumpectomy      right  . Tubal ligation    . Tonsillectomy    . Total knee arthroplasty Right 12/15/2013    Procedure:  RIGHT TOTAL KNEE ARTHROPLASTY;  Surgeon: Alta Corning, MD;  Location: [facility];  Service: Orthopedics;  Laterality: Right;  . Cardioversion N/A 03/03/2014    Procedure: CARDIOVERSION;  Surgeon: Laverda Page, MD;  Location: [facility] ENDOSCOPY;  Service: Cardiovascular;  Laterality: N/A;  . Atrial flutter ablation  2008    CTI ablation by Dr Lovena Le    Family History: Family History  Problem Relation Age of Onset  . Heart disease Mother   . Cancer Mother     bladder  . Kidney disease Mother   . Ovarian cancer Daughter   . Stomach cancer Maternal Uncle   . Colon cancer Maternal Aunt   . Esophageal cancer Neg Hx     Social History: History   Social History  . Marital Status: Widowed    Spouse Name: N/A    Number of Children: 3  . Years of Education: N/A   Social History Main Topics  . Smoking status: Former Research scientist (life sciences)  . Smokeless tobacco: Never Used     Comment: 05/2014  QUIT OVER 20 YEARS AGO "  . Alcohol Use: No  . Drug Use: No  . Sexual Activity: Not Currently   Other Topics Concern  . None   Social History Narrative  . None    Allergies:  Allergies  Allergen Reactions  . Codeine Nausea And Vomiting  . Penicillins Nausea And Vomiting  . Sulfa Antibiotics Itching and Nausea And Vomiting    "everything I seen was red"  . Other Itching    Adhesive from ekg leads    Objective:    Vital Signs:   Temp:  [99.1 F (37.3 C)] 99.1 F (37.3 C) (10/06 1033) Pulse Rate:  [38-122] 122 (10/06 1445) Resp:  [17-25] 23 (10/06 1445) BP: (134-167)/(61-114) 145/61 mmHg (10/06 1458) SpO2:  [98 %-100 %] 98 % (10/06  1445) Weight:  [226 lb (102.513 kg)] 226 lb (102.513 kg) (10/06 1033)   Filed Weights   06/09/14 1033  Weight: 226 lb (102.513 kg)    Physical Exam:  General: Obese, NAD, lying in bed  HEENT: normal  Neck: supple. JVP8-9; Carotids 2+ bilat; no bruits. No lymphadenopathy or thryomegaly appreciate  Cor: PMI nonpalpbale. Distant. Irregular. No obvious murmur  Lungs: Diminished in the bases  Abdomen: Obese soft, nontender. Non-distended. No hepatosplenomegaly. No bruits or masses. Good bowel sounds.  Extremities: no cyanosis, clubbing, rash, warm. Tr edema  Neuro: alert & orientedx3, cranial nerves grossly intact. moves all 4 extremities w/o difficulty. Affect   Telemetry: AF 90-105  Labs: Basic Metabolic Panel:  Recent Labs Lab 06/03/14 0502 06/09/14 1105  NA 142 140  K 3.4* 4.4  CL 102 105  CO2 27 21  GLUCOSE 135* 101*  BUN 29* 17  CREATININE 2.30* 2.23*  CALCIUM 9.1 9.4    Liver Function Tests: No results found for this basename: AST, ALT, ALKPHOS, BILITOT, PROT, ALBUMIN,  in the last 168 hours No results found for this basename: LIPASE, AMYLASE,  in the last 168 hours No results found for this basename: AMMONIA,  in the last 168 hours  CBC:  Recent Labs Lab 06/09/14 1105  WBC 8.5  HGB 12.8  HCT 39.9  MCV 91.5  PLT 268    Cardiac Enzymes: No results found for this basename: CKTOTAL, CKMB, CKMBINDEX, TROPONINI,  in the last 168 hours  BNP: BNP (last 3 results)  Recent Labs  03/03/14 1851 05/25/14 1808 06/09/14 1105  PROBNP 1531.0* 2123.0* 2594.0*    CBG:  Recent Labs Lab 06/02/14 1644 06/02/14 1947 06/03/14 0727 06/03/14 1132  GLUCAP 183* 270* 160* 207*    Coagulation Studies:  Recent Labs  06/09/14 1105  LABPROT 16.0*  INR 1.28    Other results: EKG:AF with RVR  Imaging: Dg Chest Portable 1 View  06/09/2014   CLINICAL DATA:  Midchest pain, onset 1 day ago. Shortness of breath.  EXAM: PORTABLE CHEST - 1 VIEW  COMPARISON:   04/02/2014.  FINDINGS: Mediastinum and hilar structures normal. Cardiomegaly of mild pulmonary venous congestion. Bilateral pulmonary alveolar infiltrates noted. These changes may be related congestive heart failure with pulmonary edema. Pneumonia cannot be excluded. No pleural effusion or pneumothorax. No acute bony abnormality.  IMPRESSION: 1. Findings suggesting congestive heart failure with mild pulmonary edema.  2. Pneumonia cannot be excluded. Similar infiltrates are noted on prior study 04/02/2014. These infiltrates could be recurrent or persistent. Close follow-up chest x-rays to demonstrate clearing suggested.   Electronically Signed   By: Marcello Moores  Register   On: 06/09/2014 12:26         Assessment:   1. A/C respiratory hypoxemic failure 2. A/C diastolic HF 3. Chronic AF with RVR 4. Tachy-brady syndrome 5. CKD, stage IV  Plan/Discussion:    Volume status only mildly elevated. AF rate now controlled. Will attempt to diurese in ER and try to get her to a point where she will be able to go home on increased torsemide dose 40/20. Will also increase diltiazem 240 daily. She has f/u in HF Clinic on Friday.   Give Lasix 80 IV now and reassess in 2 hours.   D/w ER team  Length of Stay: 0   Genevive Bi 06/09/2014, 3:14 PM  Advanced Heart Failure Team Pager [phone] (M-F; 7a - 4p)  Please contact [facility] for night-coverage after hours (4p -7a ) and weekends on [email or web address]

## 2014-06-09 NOTE — ED Notes (Signed)
Myself and Matt, RN undressed pt, placed in gown, on monitor, continuous pulse oximetry, blood pressure cuff and oxygen [place] (2L)

## 2014-06-09 NOTE — ED Notes (Signed)
Denies CHEST PAIN AND SOB. NAD

## 2014-06-09 NOTE — ED Notes (Signed)
Portable x-ray tech at bedside 

## 2014-06-09 NOTE — ED Notes (Signed)
MD at bedside. [name]

## 2014-06-09 NOTE — ED Provider Notes (Signed)
CSN: [id number]     Arrival date & time 06/09/14  1023 History   First MD Initiated Contact with Patient 06/09/14 1034     Chief Complaint  Patient presents with  . Shortness of Breath      HPI 68 yo female from home with EMS c/o SOB since yest morning. Pt is 3 L dependent home oxygen. Per EMS saturation at 83%, placed on 12L [place] saturation 100%.HX of CHF pt consulted Dr. Clayborne Dana office that told pt to call 911. NP made aware by EMS recently d/c Sept 30th.   Past Medical History  Diagnosis Date  . Atrial flutter     ablated by Dr Lovena Le in 2008  . Hypertension   . Diabetes mellitus   . Diastolic heart failure     a. EF 60-65%, RV nl (03/2014)  . Asthma   . Hyperlipidemia   . Fatty liver   . Esophageal dysmotility   . Arthritis   . Sleep apnea     wears CPAP  . Fatty tumor fatty tumor back  . Coronary atherosclerosis of native coronary artery   . Morbid obesity   . Myocardial infarction 2009  . Heart murmur   . Peripheral vascular disease   . GERD (gastroesophageal reflux disease)     barrets esophagus  . Anginal pain     occ; non-ischemic Lexiscan 09/2012  . Kidney disease     CKD stage IV (Dr. Erling Cruz)  . Complication of anesthesia     " DIFFICULTY BREATHING "  . Persistent atrial fibrillation   . Tachycardia-bradycardia   . [name] esophagus   . Hiatal hernia   . Esophageal dysmotilities    Past Surgical History  Procedure Laterality Date  . Coronary angioplasty with stent placement    . Breast lumpectomy      right  . Tubal ligation    . Tonsillectomy    . Total knee arthroplasty Right 12/15/2013    Procedure: RIGHT TOTAL KNEE ARTHROPLASTY;  Surgeon: Alta Corning, MD;  Location: [facility];  Service: Orthopedics;  Laterality: Right;  . Cardioversion N/A 03/03/2014    Procedure: CARDIOVERSION;  Surgeon: Laverda Page, MD;  Location: [facility];  Service: Cardiovascular;  Laterality: N/A;  . Atrial flutter ablation  2008    CTI ablation by Dr Lovena Le    Family History  Problem Relation Age of Onset  . Heart disease Mother   . Cancer Mother     bladder  . Kidney disease Mother   . Ovarian cancer Daughter   . Stomach cancer Maternal Uncle   . Colon cancer Maternal Aunt   . Esophageal cancer Neg Hx    History  Substance Use Topics  . Smoking status: Former Research scientist (life sciences)  . Smokeless tobacco: Never Used     Comment: 05/2014  QUIT OVER 20 YEARS AGO "  . Alcohol Use: No   OB History   Grav Para Term Preterm Abortions TAB SAB Ect Mult Living                 Review of Systems  Unable to perform ROS: Acuity of condition      Allergies  Codeine; Penicillins; Sulfa antibiotics; and Other  Home Medications   Prior to Admission medications   Medication Sig Start Date End Date Taking? Authorizing Provider  acetaminophen (TYLENOL) 500 MG tablet Take 500 mg by mouth every 6 (six) hours as needed for mild pain.   Yes Historical Provider, MD  amitriptyline (ELAVIL)  25 MG tablet Take 25 mg by mouth at bedtime.     Yes Historical Provider, MD  apixaban (ELIQUIS) 5 MG TABS tablet Take 1 tablet (5 mg total) by mouth 2 (two) times daily. 04/29/14  Yes Jolaine Artist, MD  atorvastatin (LIPITOR) 40 MG tablet Take 1 tablet (40 mg total) by mouth daily. 04/29/14  Yes Jolaine Artist, MD  dicyclomine (BENTYL) 10 MG capsule Take 10 mg by mouth daily.    Yes Historical Provider, MD  esomeprazole (NEXIUM) 40 MG capsule Take 1 capsule (40 mg total) by mouth daily at 12 noon. 04/29/14  Yes Jolaine Artist, MD  febuxostat (ULORIC) 40 MG tablet Take 40 mg by mouth daily.    Yes Historical Provider, MD  hydrALAZINE (APRESOLINE) 25 MG tablet Take 1 tablet (25 mg total) by mouth 3 (three) times daily. 06/03/14  Yes Rande Brunt, NP  insulin glargine (LANTUS) 100 UNIT/ML injection Inject 40 Units into the skin daily as needed. 12/23/13  Yes Delfina Redwood, MD  insulin lispro (HUMALOG) 100 UNIT/ML injection Inject 0.2 mLs (20 Units total) into the skin  3 (three) times daily after meals. 12/23/13  Yes Delfina Redwood, MD  Linaclotide Rolan Lipa) 145 MCG CAPS capsule Take 290 mcg by mouth daily.   Yes Historical Provider, MD  metoprolol tartrate (LOPRESSOR) 25 MG tablet Take 3 tablets (75 mg total) by mouth 2 (two) times daily. 06/03/14  Yes Rande Brunt, NP  potassium chloride SA (K-DUR,KLOR-CON) 20 MEQ tablet Take 1 tablet (20 mEq total) by mouth 2 (two) times daily. 04/02/14  Yes Jolaine Artist, MD  ranitidine (ZANTAC) 150 MG tablet Take 1 tablet (150 mg total) by mouth at bedtime. 04/29/14  Yes Jolaine Artist, MD  Vitamin D, Ergocalciferol, (DRISDOL) 50000 UNITS CAPS capsule Take 50,000 Units by mouth every Monday.   Yes Historical Provider, MD  XOPENEX HFA 45 MCG/ACT inhaler Inhale 2 puffs into the lungs every 4 (four) hours as needed for wheezing or shortness of breath. 03/11/14  Yes Marijean Heath, NP  zolpidem (AMBIEN) 10 MG tablet Take 10 mg by mouth at bedtime.    Yes Historical Provider, MD  diltiazem (CARDIZEM CD) 360 MG 24 hr capsule Take 1 capsule (360 mg total) by mouth daily. 06/12/14   Rhonda G [name], PA-C  levofloxacin (LEVAQUIN) 500 MG tablet Take 1 tablet (500 mg total) by mouth daily. 06/12/14   Rhonda G [name], PA-C  nitroGLYCERIN (NITROSTAT) 0.4 MG SL tablet Place 0.4 mg under the tongue every 5 (five) minutes as needed for chest pain.     Historical Provider, MD  torsemide (DEMADEX) 20 MG tablet Take 1-2 tablets (20-40 mg total) by mouth 2 (two) times daily. Take 40 mg a.m., 20 mg p.m. 06/12/14   Evelene Croon [name], PA-C   BP 137/118  Pulse 89  Temp(Src) 97.2 F (36.2 C) (Oral)  Resp 18  Ht 5\' 4"  (1.626 m)  Wt 224 lb 3.2 oz (101.696 kg)  BMI 38.46 kg/m2  SpO2 100% Physical Exam  Nursing note and vitals reviewed. Constitutional: She is oriented to person, place, and time. She appears well-developed and well-nourished. No distress.  HENT:  Head: Normocephalic and atraumatic.  Eyes: Pupils are equal, round,  and reactive to light.  Neck: Normal range of motion.  Cardiovascular: Intact distal pulses.  An irregular rhythm present. Tachycardia present.   Pulmonary/Chest: No respiratory distress. She has rales.  Abdominal: Normal appearance. She exhibits no distension.  Musculoskeletal:  Normal range of motion.  Neurological: She is alert and oriented to person, place, and time. No cranial nerve deficit.  Skin: Skin is warm and dry. No rash noted.  Psychiatric: She has a normal mood and affect. Her behavior is normal.    ED Course  Procedures (including critical care time)  CRITICAL CARE Performed by: Leonard Schwartz L Total critical care time: 30 min Critical care time was exclusive of separately billable procedures and treating other patients. Critical care was necessary to treat or prevent imminent or life-threatening deterioration. Critical care was time spent personally by me on the following activities: development of treatment plan with patient and/or surrogate as well as nursing, discussions with consultants, evaluation of patient's response to treatment, examination of patient, obtaining history from patient or surrogate, ordering and performing treatments and interventions, ordering and review of laboratory studies, ordering and review of radiographic studies, pulse oximetry and re-evaluation of patient's condition.   Medications  diltiazem (CARDIZEM) 1 mg/mL load via infusion 20 mg (0 mg Intravenous Stopped 06/09/14 1114)  acetaminophen (TYLENOL) tablet 650 mg (650 mg Oral Given 06/09/14 1323)  furosemide (LASIX) injection 80 mg (80 mg Intravenous Given 06/09/14 1349)  metoprolol tartrate (LOPRESSOR) tablet 75 mg (75 mg Oral Given 06/09/14 1458)  vancomycin (VANCOCIN) 2,000 mg in sodium chloride 0.9 % 500 mL IVPB (2,000 mg Intravenous Given 06/10/14 1200)  aspirin chewable tablet 81 mg (81 mg Oral Given 06/12/14 0510)  lidocaine (PF) (XYLOCAINE) 1 % injection (not administered)  heparin 2-0.9  UNIT/ML-% infusion (not administered)  midazolam (VERSED) 2 MG/2ML injection (not administered)  fentaNYL (SUBLIMAZE) 0.05 MG/ML injection (not administered)  hydrALAZINE (APRESOLINE) injection 10 mg (10 mg Intravenous Given 06/12/14 1036)    Labs Review Labs Reviewed  CBC - Abnormal; Notable for the following:    RDW 16.9 (*)    All other components within normal limits  BASIC METABOLIC PANEL - Abnormal; Notable for the following:    Glucose, Bld 101 (*)    Creatinine, Ser 2.23 (*)    GFR calc non Af Amer 21 (*)    GFR calc Af Amer 25 (*)    All other components within normal limits  PRO B NATRIURETIC PEPTIDE - Abnormal; Notable for the following:    Pro B Natriuretic peptide (BNP) 2594.0 (*)    All other components within normal limits  PROTIME-INR - Abnormal; Notable for the following:    Prothrombin Time 16.0 (*)    All other components within normal limits  BASIC METABOLIC PANEL - Abnormal; Notable for the following:    Glucose, Bld 151 (*)    Creatinine, Ser 2.14 (*)    GFR calc non Af Amer 23 (*)    GFR calc Af Amer 26 (*)    All other components within normal limits  HEMOGLOBIN A1C - Abnormal; Notable for the following:    Hemoglobin A1C 7.4 (*)    Mean Plasma Glucose 166 (*)    All other components within normal limits  GLUCOSE, CAPILLARY - Abnormal; Notable for the following:    Glucose-Capillary 259 (*)    All other components within normal limits  GLUCOSE, CAPILLARY - Abnormal; Notable for the following:    Glucose-Capillary 153 (*)    All other components within normal limits  GLUCOSE, CAPILLARY - Abnormal; Notable for the following:    Glucose-Capillary 198 (*)    All other components within normal limits  GLUCOSE, CAPILLARY - Abnormal; Notable for the following:    Glucose-Capillary 194 (*)  All other components within normal limits  GLUCOSE, CAPILLARY - Abnormal; Notable for the following:    Glucose-Capillary 174 (*)    All other components within  normal limits  GLUCOSE, CAPILLARY - Abnormal; Notable for the following:    Glucose-Capillary 218 (*)    All other components within normal limits  BASIC METABOLIC PANEL - Abnormal; Notable for the following:    Glucose, Bld 220 (*)    Creatinine, Ser 2.22 (*)    GFR calc non Af Amer 22 (*)    GFR calc Af Amer 25 (*)    All other components within normal limits  GLUCOSE, CAPILLARY - Abnormal; Notable for the following:    Glucose-Capillary 258 (*)    All other components within normal limits  CBC - Abnormal; Notable for the following:    RDW 16.8 (*)    All other components within normal limits  BASIC METABOLIC PANEL - Abnormal; Notable for the following:    Glucose, Bld 233 (*)    Creatinine, Ser 2.45 (*)    GFR calc non Af Amer 19 (*)    GFR calc Af Amer 22 (*)    All other components within normal limits  GLUCOSE, CAPILLARY - Abnormal; Notable for the following:    Glucose-Capillary 147 (*)    All other components within normal limits  CBC - Abnormal; Notable for the following:    RDW 16.7 (*)    All other components within normal limits  GLUCOSE, CAPILLARY - Abnormal; Notable for the following:    Glucose-Capillary 204 (*)    All other components within normal limits  GLUCOSE, CAPILLARY - Abnormal; Notable for the following:    Glucose-Capillary 229 (*)    All other components within normal limits  GLUCOSE, CAPILLARY - Abnormal; Notable for the following:    Glucose-Capillary 217 (*)    All other components within normal limits  GLUCOSE, CAPILLARY - Abnormal; Notable for the following:    Glucose-Capillary 206 (*)    All other components within normal limits  GLUCOSE, CAPILLARY - Abnormal; Notable for the following:    Glucose-Capillary 266 (*)    All other components within normal limits  POCT I-STAT 3, VENOUS BLOOD GAS (G3P V) - Abnormal; Notable for the following:    pH, Ven 7.378 (*)    pCO2, Ven 53.6 (*)    Bicarbonate 31.5 (*)    Acid-Base Excess 5.0 (*)     All other components within normal limits  POCT I-STAT 3, VENOUS BLOOD GAS (G3P V) - Abnormal; Notable for the following:    pH, Ven 7.378 (*)    pCO2, Ven 50.6 (*)    Bicarbonate 29.8 (*)    Acid-Base Excess 4.0 (*)    All other components within normal limits  TROPONIN I  TROPONIN I  TROPONIN I  I-STAT TROPOININ, ED    Imaging Review No results found. Medications  diltiazem (CARDIZEM) 1 mg/mL load via infusion 20 mg (0 mg Intravenous Stopped 06/09/14 1114)  acetaminophen (TYLENOL) tablet 650 mg (650 mg Oral Given 06/09/14 1323)  furosemide (LASIX) injection 80 mg (80 mg Intravenous Given 06/09/14 1349)  metoprolol tartrate (LOPRESSOR) tablet 75 mg (75 mg Oral Given 06/09/14 1458)  vancomycin (VANCOCIN) 2,000 mg in sodium chloride 0.9 % 500 mL IVPB (2,000 mg Intravenous Given 06/10/14 1200)  aspirin chewable tablet 81 mg (81 mg Oral Given 06/12/14 0510)  lidocaine (PF) (XYLOCAINE) 1 % injection (not administered)  heparin 2-0.9 UNIT/ML-% infusion (not administered)  midazolam (  VERSED) 2 MG/2ML injection (not administered)  fentaNYL (SUBLIMAZE) 0.05 MG/ML injection (not administered)  hydrALAZINE (APRESOLINE) injection 10 mg (10 mg Intravenous Given 06/12/14 1036)   CRITICAL CARE Performed by: Leonard Schwartz L Total critical care time: 30 min Critical care time was exclusive of separately billable procedures and treating other patients. Critical care was necessary to treat or prevent imminent or life-threatening deterioration. Critical care was time spent personally by me on the following activities: development of treatment plan with patient and/or surrogate as well as nursing, discussions with consultants, evaluation of patient's response to treatment, examination of patient, obtaining history from patient or surrogate, ordering and performing treatments and interventions, ordering and review of laboratory studies, ordering and review of radiographic studies, pulse oximetry and  re-evaluation of patient's condition.   EKG Interpretation   Date/Time:  Tuesday June 09 2014 10:27:04 EDT Ventricular Rate:  122 PR Interval:  164 QRS Duration: 101 QT Interval:  362 QTC Calculation: 516 R Axis:   46 Text Interpretation:  Atrial fibrillation with RVR. Abnormal T, consider ischemia,  lateral leads Prolonged QT interval Confirmed by Audie Pinto  MD, Harish Bram  ([id number]) on 06/09/2014 10:34:28 AM     Patient was seen by cardiology(Dr. Haroldine Laws) in the emergency room MDM   Final diagnoses:  Acute on chronic respiratory failure with hypoxia  Acute on chronic diastolic congestive heart failure  Atrial fibrillation with RVR  Pneumonia, organism unspecified  Dyspnea  Chronic kidney disease (CKD), stage IV (severe)  Persistent atrial fibrillation  Obstructive sleep apnea  Acute on chronic respiratory failure, unspecified whether with hypoxia or hypercapnia  Type 2 diabetes mellitus with diabetic chronic kidney disease        Dot Lanes, MD 06/13/14 2146

## 2014-06-09 NOTE — ED Notes (Signed)
68 yo female from home with EMS c/o SOB since yest morning. Pt is 3 L dependent home oxygen. Per EMS saturation at 83%, placed on 12L [place] saturation 100%. Lungs rales/diminished throughout. HX of CHF pt consulted Dr. Clayborne Dana office that told pt to call 911. NP made aware by EMS recently d/c Sept 30th.   Vitals  160/110 Resp 22 HR 120 CBG 120

## 2014-06-09 NOTE — ED Notes (Signed)
Pt given a blanket; family at bedside

## 2014-06-09 NOTE — Telephone Encounter (Signed)
Pt called to c/o a heaviness in her chest and SOB, pt is very dyspneic on the phone and states this has been going on for a couple of days, she states her wt is stable at 230 lb, she reports that her [facility] is on the way to her home, advised they could check her out but she may need to go to ER as pt is so dyspneic, she is agreeable and will await [facility]

## 2014-06-09 NOTE — ED Notes (Signed)
MD at bedside. Cardiology 

## 2014-06-09 NOTE — ED Notes (Addendum)
Pt FLOW contacted

## 2014-06-10 DIAGNOSIS — R0602 Shortness of breath: Secondary | ICD-10-CM | POA: Diagnosis present

## 2014-06-10 DIAGNOSIS — I481 Persistent atrial fibrillation: Secondary | ICD-10-CM | POA: Diagnosis present

## 2014-06-10 DIAGNOSIS — I5033 Acute on chronic diastolic (congestive) heart failure: Secondary | ICD-10-CM | POA: Diagnosis present

## 2014-06-10 DIAGNOSIS — Z955 Presence of coronary angioplasty implant and graft: Secondary | ICD-10-CM | POA: Diagnosis not present

## 2014-06-10 DIAGNOSIS — Z881 Allergy status to other antibiotic agents status: Secondary | ICD-10-CM | POA: Diagnosis not present

## 2014-06-10 DIAGNOSIS — I129 Hypertensive chronic kidney disease with stage 1 through stage 4 chronic kidney disease, or unspecified chronic kidney disease: Secondary | ICD-10-CM | POA: Diagnosis present

## 2014-06-10 DIAGNOSIS — Z880 Allergy status to penicillin: Secondary | ICD-10-CM | POA: Diagnosis not present

## 2014-06-10 DIAGNOSIS — Z87891 Personal history of nicotine dependence: Secondary | ICD-10-CM | POA: Diagnosis not present

## 2014-06-10 DIAGNOSIS — Z96651 Presence of right artificial knee joint: Secondary | ICD-10-CM | POA: Diagnosis present

## 2014-06-10 DIAGNOSIS — Z6838 Body mass index (BMI) 38.0-38.9, adult: Secondary | ICD-10-CM | POA: Diagnosis not present

## 2014-06-10 DIAGNOSIS — Z794 Long term (current) use of insulin: Secondary | ICD-10-CM | POA: Diagnosis not present

## 2014-06-10 DIAGNOSIS — Z79899 Other long term (current) drug therapy: Secondary | ICD-10-CM | POA: Diagnosis not present

## 2014-06-10 DIAGNOSIS — E785 Hyperlipidemia, unspecified: Secondary | ICD-10-CM | POA: Diagnosis present

## 2014-06-10 DIAGNOSIS — I4891 Unspecified atrial fibrillation: Secondary | ICD-10-CM

## 2014-06-10 DIAGNOSIS — Z91048 Other nonmedicinal substance allergy status: Secondary | ICD-10-CM | POA: Diagnosis not present

## 2014-06-10 DIAGNOSIS — Z9981 Dependence on supplemental oxygen: Secondary | ICD-10-CM | POA: Diagnosis not present

## 2014-06-10 DIAGNOSIS — G4733 Obstructive sleep apnea (adult) (pediatric): Secondary | ICD-10-CM | POA: Diagnosis present

## 2014-06-10 DIAGNOSIS — N184 Chronic kidney disease, stage 4 (severe): Secondary | ICD-10-CM | POA: Diagnosis present

## 2014-06-10 DIAGNOSIS — J9621 Acute and chronic respiratory failure with hypoxia: Secondary | ICD-10-CM | POA: Diagnosis present

## 2014-06-10 DIAGNOSIS — I252 Old myocardial infarction: Secondary | ICD-10-CM | POA: Diagnosis not present

## 2014-06-10 DIAGNOSIS — G4730 Sleep apnea, unspecified: Secondary | ICD-10-CM | POA: Diagnosis present

## 2014-06-10 DIAGNOSIS — J45909 Unspecified asthma, uncomplicated: Secondary | ICD-10-CM | POA: Diagnosis present

## 2014-06-10 DIAGNOSIS — I495 Sick sinus syndrome: Secondary | ICD-10-CM | POA: Diagnosis present

## 2014-06-10 DIAGNOSIS — K2270 Barrett's esophagus without dysplasia: Secondary | ICD-10-CM | POA: Diagnosis present

## 2014-06-10 DIAGNOSIS — I739 Peripheral vascular disease, unspecified: Secondary | ICD-10-CM | POA: Diagnosis present

## 2014-06-10 DIAGNOSIS — K219 Gastro-esophageal reflux disease without esophagitis: Secondary | ICD-10-CM | POA: Diagnosis present

## 2014-06-10 DIAGNOSIS — Z7901 Long term (current) use of anticoagulants: Secondary | ICD-10-CM | POA: Diagnosis not present

## 2014-06-10 DIAGNOSIS — J189 Pneumonia, unspecified organism: Secondary | ICD-10-CM

## 2014-06-10 DIAGNOSIS — E1122 Type 2 diabetes mellitus with diabetic chronic kidney disease: Secondary | ICD-10-CM | POA: Diagnosis present

## 2014-06-10 DIAGNOSIS — Z885 Allergy status to narcotic agent status: Secondary | ICD-10-CM | POA: Diagnosis not present

## 2014-06-10 LAB — BASIC METABOLIC PANEL
Anion gap: 10 (ref 5–15)
BUN: 18 mg/dL (ref 6–23)
CO2: 29 mEq/L (ref 19–32)
Calcium: 9.1 mg/dL (ref 8.4–10.5)
Chloride: 103 mEq/L (ref 96–112)
Creatinine, Ser: 2.14 mg/dL — ABNORMAL HIGH (ref 0.50–1.10)
GFR calc Af Amer: 26 mL/min — ABNORMAL LOW (ref >90)
GFR calc non Af Amer: 23 mL/min — ABNORMAL LOW (ref >90)
Glucose, Bld: 151 mg/dL — ABNORMAL HIGH (ref 70–99)
Potassium: 3.7 mEq/L (ref 3.7–5.3)
Sodium: 142 mEq/L (ref 137–147)

## 2014-06-10 LAB — TROPONIN I
Troponin I: <0.3 ng/mL (ref <0.30)
Troponin I: <0.3 ng/mL (ref <0.30)

## 2014-06-10 LAB — GLUCOSE, CAPILLARY
Glucose-Capillary: 153 mg/dL — ABNORMAL HIGH (ref 70–99)
Glucose-Capillary: 198 mg/dL — ABNORMAL HIGH (ref 70–99)
Glucose-Capillary: 194 mg/dL — ABNORMAL HIGH (ref 70–99)
Glucose-Capillary: 174 mg/dL — ABNORMAL HIGH (ref 70–99)

## 2014-06-10 LAB — HEMOGLOBIN A1C
Hgb A1c MFr Bld: 7.4 % — ABNORMAL HIGH (ref <5.7)
Mean Plasma Glucose: 166 mg/dL — ABNORMAL HIGH (ref <117)

## 2014-06-10 MED ORDER — VANCOMYCIN HCL 10 G IV SOLR
2000.0000 mg | Freq: Once | INTRAVENOUS | Status: AC
  Administered 2014-06-10: 2000 mg via INTRAVENOUS
  Filled 2014-06-10: qty 2000

## 2014-06-10 MED ORDER — PIPERACILLIN-TAZOBACTAM 3.375 G IVPB
3.3750 g | Freq: Three times a day (TID) | INTRAVENOUS | Status: DC
  Administered 2014-06-10 – 2014-06-12 (×6): 3.375 g via INTRAVENOUS
  Filled 2014-06-10 (×9): qty 50

## 2014-06-10 MED ORDER — VANCOMYCIN HCL 10 G IV SOLR
1500.0000 mg | INTRAVENOUS | Status: DC
  Administered 2014-06-11 – 2014-06-12 (×2): 1500 mg via INTRAVENOUS
  Filled 2014-06-10 (×2): qty 1500

## 2014-06-10 MED ORDER — METOPROLOL SUCCINATE ER 50 MG PO TB24
50.0000 mg | ORAL_TABLET | Freq: Two times a day (BID) | ORAL | Status: DC
  Administered 2014-06-10 – 2014-06-12 (×5): 50 mg via ORAL
  Filled 2014-06-10 (×6): qty 1

## 2014-06-10 NOTE — Progress Notes (Signed)
Patient did well throughout the night.  Had no complaints and is now on 4L of oxygen nasal cannula instead of 5L to wean her back to her baseline of 3L at home.

## 2014-06-10 NOTE — Progress Notes (Signed)
Advanced Heart Failure Rounding Note   Subjective:    [name] is a 68 yo female with a history of HTN, HLD, bronchial asthma on home 3L O2, DM2, CKD stage IV, atrial fibrillation (s/p aflutter ablation in 2008), tachy-brady syndrome and diastolic HF.   Admitted in 7/15 due to aspiration PNA after elective DC-CV with Dr. Einar Gip. While in hospital reverted back to AF and rate control strategy pursued. However, had trouble tolerating AF so was kept on amio with plans for eventual DC-CV   Evaluated in the HF clinic on 05/25/14 with reports of being extremely dizzy, fatigued and increased SOB with ambulation. Found to have junctional rhythm with rates 27-32 bpm. She initially converted to NSR but then went back to AF with RVR. EP was consulted to assess for PPM for tachy-brady syndrome. Dr. Rayann Heman saw the patient and felt she failed amio and suggested another trial of rate control. She was discharged on lopressor 75 bid and diltiazem 180 daily. Demadex cut to 20 daily. D/Cweight 226 pounds.   Weight has gone up a few pounds over the past few days. This am developed respiratory distress with CP and sats into 80s. EMS called. HR 120.   Admitted with increased dyspnea and tachycardia. Placed on IV diltiazem and later transitioned to po cardizem. Diuresed with IV lasix in the ED but had ongoing dyspnea so she was admitted.  CEs negative. Pro BNP up slightly.   Brisk diuresis over night. Weight down 4 pounds. Still with dyspnea on exertion.   BMET pending.   CXR- Mild pulmonary edema.. ? Infiltrates.    Objective:   Weight Range:  Vital Signs:   Temp:  [98 F (36.7 C)-99.1 F (37.3 C)] 98.1 F (36.7 C) (10/07 0435) Pulse Rate:  [38-122] 78 (10/07 0435) Resp:  [17-25] 18 (10/07 0435) BP: (131-167)/(61-114) 133/84 mmHg (10/07 0435) SpO2:  [97 %-100 %] 98 % (10/07 0435) Weight:  [226 lb (102.513 kg)-230 lb 9.6 oz (104.6 kg)] 226 lb 3.2 oz (102.604 kg) (10/07 0435) Last BM Date:  06/08/14  Weight change: Filed Weights   06/09/14 1033 06/09/14 1819 06/10/14 0435  Weight: 226 lb (102.513 kg) 230 lb 9.6 oz (104.6 kg) 226 lb 3.2 oz (102.604 kg)    Intake/Output:   Intake/Output Summary (Last 24 hours) at 06/10/14 0815 Last data filed at 06/10/14 0435  Gross per 24 hour  Intake    150 ml  Output   2550 ml  Net  -2400 ml     Physical Exam:  General: Obese, NAD, sitting on the side of the bed.  HEENT: normal  Neck: supple. JVP 10; Carotids 2+ bilat; no bruits. No lymphadenopathy or thryomegaly appreciate  Cor: PMI nonpalpbale. Decreased in the bases.  Irregular. No obvious murmur  Lungs: Diminished in the bases  Abdomen: Obese soft, nontender. Non-distended. No hepatosplenomegaly. No bruits or masses. Good bowel sounds.  Extremities: no cyanosis, clubbing, rash, warm. Tr edema  Neuro: alert & orientedx3, cranial nerves grossly intact. moves all 4 extremities w/o difficulty. Affect  Telemetry: AF 80s-105     Labs: Basic Metabolic Panel:  Recent Labs Lab 06/09/14 1105  NA 140  K 4.4  CL 105  CO2 21  GLUCOSE 101*  BUN 17  CREATININE 2.23*  CALCIUM 9.4    Liver Function Tests: No results found for this basename: AST, ALT, ALKPHOS, BILITOT, PROT, ALBUMIN,  in the last 168 hours No results found for this basename: LIPASE, AMYLASE,  in the last 168  hours No results found for this basename: AMMONIA,  in the last 168 hours  CBC:  Recent Labs Lab 06/09/14 1105  WBC 8.5  HGB 12.8  HCT 39.9  MCV 91.5  PLT 268    Cardiac Enzymes:  Recent Labs Lab 06/09/14 1843 06/10/14 0010  TROPONINI <0.30 <0.30    BNP: BNP (last 3 results)  Recent Labs  03/03/14 1851 05/25/14 1808 06/09/14 1105  PROBNP 1531.0* 2123.0* 2594.0*     Other results:  EKG:   Imaging: Dg Chest Portable 1 View  06/09/2014   CLINICAL DATA:  Midchest pain, onset 1 day ago. Shortness of breath.  EXAM: PORTABLE CHEST - 1 VIEW  COMPARISON:  04/02/2014.  FINDINGS:  Mediastinum and hilar structures normal. Cardiomegaly of mild pulmonary venous congestion. Bilateral pulmonary alveolar infiltrates noted. These changes may be related congestive heart failure with pulmonary edema. Pneumonia cannot be excluded. No pleural effusion or pneumothorax. No acute bony abnormality.  IMPRESSION: 1. Findings suggesting congestive heart failure with mild pulmonary edema.  2. Pneumonia cannot be excluded. Similar infiltrates are noted on prior study 04/02/2014. These infiltrates could be recurrent or persistent. Close follow-up chest x-rays to demonstrate clearing suggested.   Electronically Signed   By: Marcello Moores  Register   On: 06/09/2014 12:26      Medications:     Scheduled Medications: . amitriptyline  25 mg Oral QHS  . apixaban  5 mg Oral BID  . atorvastatin  40 mg Oral q1800  . dicyclomine  10 mg Oral Daily  . diltiazem  360 mg Oral Daily  . famotidine  20 mg Oral QHS  . febuxostat  40 mg Oral Daily  . furosemide  80 mg Intravenous BID  . hydrALAZINE  25 mg Oral TID  . insulin aspart  0-20 Units Subcutaneous TID WC  . Linaclotide  290 mcg Oral Daily  . metoprolol tartrate  75 mg Oral BID  . pantoprazole  80 mg Oral Q1200  . potassium chloride SA  20 mEq Oral BID  . zolpidem  5 mg Oral QHS     Infusions:     PRN Medications:  acetaminophen, levalbuterol, nitroGLYCERIN, ondansetron (ZOFRAN) IV   Assessment:  1. A/C respiratory hypoxemic failure  2. A/C diastolic HF  3. Chronic AF with RVR  4. Tachy-brady syndrome  5. CKD, stage IV    Plan/Discussion:    Volume status improved. Weight down 4 pounds but she remains SOB with exertion. Continue IV lasix 80 mg twice a day. May need RHC to further sort out dyspnea. BMET pending.    Dyspnea may related to pneumonia. CXR with ? Infiltrates. No fever WBC ok. Need to consider antibiotics. Will  review CXR with Dr Aundra Dubin.   Continue lopressor 75 mg twice a day and diltiazem 360 mg daily. On apixaban.    Consult cardiac rehab.  Length of Stay: 1   CLEGG,AMY NP-C  06/10/2014, 8:15 AM  Advanced Heart Failure Team Pager [phone] (M-F; [place])  Please contact [facility] for night-coverage after hours (4p -7a ) and weekends on [email or web address]  Patient seen with NP, agree with the above note.  Possible HCAP based on CXR, will treat with course of antibiotics.  Will change metoprolol to Toprol XL 50 mg bid.   She is still volume overloaded.  Awaiting BMET (CKD).  Will continue IV Lasix today at current dose.   [name] 06/10/2014 8:51 AM

## 2014-06-10 NOTE — Progress Notes (Signed)
UR completed 

## 2014-06-10 NOTE — Progress Notes (Signed)
ANTIBIOTIC CONSULT NOTE - INITIAL  Pharmacy Consult for Vanc/Zosyn Indication: rule out pneumonia  Allergies  Allergen Reactions  . Codeine Nausea And Vomiting  . Penicillins Nausea And Vomiting  . Sulfa Antibiotics Itching and Nausea And Vomiting    "everything I seen was red"  . Other Itching    Adhesive from ekg leads    Patient Measurements: Height: 5\' 4"  (162.6 cm) Weight: 226 lb 3.2 oz (102.604 kg) (scale a) IBW/kg (Calculated) : 54.7  Vital Signs: Temp: 98.1 F (36.7 C) (10/07 0435) Temp Source: Oral (10/07 0435) BP: 133/84 mmHg (10/07 0435) Pulse Rate: 78 (10/07 0435) Intake/Output from previous day: 10/06 0701 - 10/07 0700 In: 150 [P.O.:150] Out: 2550 [Urine:2550] Intake/Output from this shift:    Labs:  Recent Labs  06/09/14 1105 06/10/14 0730  WBC 8.5  --   HGB 12.8  --   PLT 268  --   CREATININE 2.23* 2.14*   Estimated Creatinine Clearance: 29.4 ml/min (by C-G formula based on Cr of 2.14). No results found for this basename: VANCOTROUGH, VANCOPEAK, VANCORANDOM, GENTTROUGH, GENTPEAK, GENTRANDOM, TOBRATROUGH, TOBRAPEAK, TOBRARND, AMIKACINPEAK, AMIKACINTROU, AMIKACIN,  in the last 72 hours   Microbiology: No results found for this or any previous visit (from the past 720 hour(s)).  Medical History: Past Medical History  Diagnosis Date  . Atrial flutter     ablated by Dr Lovena Le in 2008  . Hypertension   . Diabetes mellitus   . Diastolic heart failure     a. EF 60-65%, RV nl (03/2014)  . Asthma   . Hyperlipidemia   . Fatty liver   . Esophageal dysmotility   . Arthritis   . Sleep apnea     wears CPAP  . Fatty tumor fatty tumor back  . Coronary atherosclerosis of native coronary artery   . Morbid obesity   . Myocardial infarction 2009  . Heart murmur   . Peripheral vascular disease   . GERD (gastroesophageal reflux disease)     barrets esophagus  . Anginal pain     occ; non-ischemic Lexiscan 09/2012  . Kidney disease     CKD stage IV  (Dr. Erling Cruz)  . Complication of anesthesia     " DIFFICULTY BREATHING "  . Persistent atrial fibrillation   . Tachycardia-bradycardia   . [name] esophagus   . Hiatal hernia   . Esophageal dysmotilities     Assessment: 68 y.o. Female with hx of HTN, HLD, asthma on home O2, DM2, CKD stage IV, afib, and HFpEF. Pharmacy consulted to start Vanc/Zosyn for potential HCAP. Pt hx of aspiration PNA 7/15 after elective DCCV. Pt afebrile, no WBC yet. Pt borderline CrCl at 29.4 for q24 vs q48h dosing for vanc. Will tx more aggressively with q24 in anticipation of CrCl rising.  Goal of Therapy:  Vancomycin trough level 15-20 mcg/ml  Plan:  - Loading dose vanc 2g IV x1 - Maintenance dose vanc 1500mg  q24h - Zosyn 3.375 g q8h (4 hr infusion) - Measure abx drug levels at steady state - Follow up culture results and de-escalation of therapy when possible   Jahanna Raether E. Taige Housman, Pharm.D Clinical Pharmacy Resident Pager: [phone] 06/10/2014 9:08 AM

## 2014-06-10 NOTE — Progress Notes (Signed)
Inpatient Diabetes Program Recommendations  AACE/ADA: New Consensus Statement on Inpatient Glycemic Control (2013)  Target Ranges:  Prepandial:   less than 140 mg/dL      Peak postprandial:   less than 180 mg/dL (1-2 hours)      Critically ill patients:  140 - 180 mg/dL   Results for ARLIS, FITZMAURICE (MRN [id number]) as of 06/10/2014 11:48  Ref. Range 06/09/2014 21:32 06/10/2014 06:30 06/10/2014 10:44  Glucose-Capillary Latest Range: 70-99 mg/dL 259 (H) 153 (H) 198 (H)   Diabetes history: DM2 Outpatient Diabetes medications: Lantus 40 units daily, Humalog 20 units TID with meals Current orders for Inpatient glycemic control: Novolog 0-20 units AC  Inpatient Diabetes Program Recommendations Insulin - Basal: Please consider ordering at least half of home dose of Lantus.  Thanks, Barnie Alderman, RN, MSN, CCRN Diabetes Coordinator Inpatient Diabetes Program [phone] (Team Pager) [phone] (AP office) [phone] [facility] office)

## 2014-06-10 NOTE — Care Management Note (Addendum)
  Page 2 of 2   06/12/2014     5:27:42 PM CARE MANAGEMENT NOTE 06/12/2014  Patient:  [name], [name]   Account Number:  [id number]  Date Initiated:  06/10/2014  Documentation initiated by:  Lugene Beougher  Subjective/Objective Assessment:   CHF     Action/Plan:   CM to follow for disposition needs   Anticipated DC Date:  06/13/2014   Anticipated DC Plan:  [place]  CM consult      PAC Choice  Resumption Of Svcs/PTA Provider   Choice offered to / List presented to:  NA        HH arranged  HH-1 RN  [place]      [facility] agency  OTHER - SEE NOTE   Status of service:  Completed, signed off Medicare Important Message given?  YES (If response is "NO", the following Medicare IM given date fields will be blank) Date Medicare IM given:  06/12/2014 Medicare IM given by:  Milbern Doescher Date Additional Medicare IM given:   Additional Medicare IM given by:    Discharge Disposition:  HOME/SELF CARE  Per UR Regulation:  Reviewed for med. necessity/level of care/duration of stay  If discussed at [place] of Stay Meetings, dates discussed:    Comments:  Clerance Umland RN, BSN, MSHL, CCM  Nurse - Case Manager,  (Unit [place])  [phone]  06/12/2014 Dispo:  Home with HHS RN, PT, HHA [name] / [place] and main office notified of fax and d/c today) Demographics, H&P, D/c summary, HH order, (no face / face d/t resumption orders)- CM clarified with [place] Crane PA covering for Dr. Haroldine Laws.   Lorice Lafave RN, BSN, MSHL, CCM  Nurse - Case Manager,  (Unit [phone]  06/10/2014 IV Lasix Disposition Plan:  Resume HHS:  RN, HHA with [employer]

## 2014-06-11 ENCOUNTER — Inpatient Hospital Stay (HOSPITAL_COMMUNITY): Payer: Medicare HMO

## 2014-06-11 DIAGNOSIS — I481 Persistent atrial fibrillation: Secondary | ICD-10-CM

## 2014-06-11 DIAGNOSIS — N184 Chronic kidney disease, stage 4 (severe): Secondary | ICD-10-CM

## 2014-06-11 DIAGNOSIS — I495 Sick sinus syndrome: Secondary | ICD-10-CM

## 2014-06-11 LAB — CBC
HCT: 38.9 % (ref 36.0–46.0)
Hemoglobin: 12.6 g/dL (ref 12.0–15.0)
MCH: 29.5 pg (ref 26.0–34.0)
MCHC: 32.4 g/dL (ref 30.0–36.0)
MCV: 91.1 fL (ref 78.0–100.0)
Platelets: 303 10*3/uL (ref 150–400)
RBC: 4.27 MIL/uL (ref 3.87–5.11)
RDW: 16.8 % — ABNORMAL HIGH (ref 11.5–15.5)
WBC: 6.7 10*3/uL (ref 4.0–10.5)

## 2014-06-11 LAB — BASIC METABOLIC PANEL
Anion gap: 15 (ref 5–15)
BUN: 19 mg/dL (ref 6–23)
CO2: 24 mEq/L (ref 19–32)
Calcium: 9.4 mg/dL (ref 8.4–10.5)
Chloride: 102 mEq/L (ref 96–112)
Creatinine, Ser: 2.22 mg/dL — ABNORMAL HIGH (ref 0.50–1.10)
GFR calc Af Amer: 25 mL/min — ABNORMAL LOW (ref >90)
GFR calc non Af Amer: 22 mL/min — ABNORMAL LOW (ref >90)
Glucose, Bld: 220 mg/dL — ABNORMAL HIGH (ref 70–99)
Potassium: 3.9 mEq/L (ref 3.7–5.3)
Sodium: 141 mEq/L (ref 137–147)

## 2014-06-11 LAB — GLUCOSE, CAPILLARY
Glucose-Capillary: 218 mg/dL — ABNORMAL HIGH (ref 70–99)
Glucose-Capillary: 258 mg/dL — ABNORMAL HIGH (ref 70–99)
Glucose-Capillary: 147 mg/dL — ABNORMAL HIGH (ref 70–99)
Glucose-Capillary: 204 mg/dL — ABNORMAL HIGH (ref 70–99)

## 2014-06-11 MED ORDER — SODIUM CHLORIDE 0.9 % IV SOLN: 250.0000 mL | INTRAVENOUS | Status: DC | PRN

## 2014-06-11 MED ORDER — ASPIRIN 81 MG PO CHEW
81.0000 mg | CHEWABLE_TABLET | ORAL | Status: AC
Start: 2014-06-12 — End: 2014-06-12
  Administered 2014-06-12: 81 mg via ORAL
  Filled 2014-06-11: qty 1

## 2014-06-11 MED ORDER — SODIUM CHLORIDE 0.9 % IJ SOLN: 3.0000 mL | Freq: Two times a day (BID) | INTRAMUSCULAR | Status: DC

## 2014-06-11 MED ORDER — INSULIN GLARGINE 100 UNIT/ML ~~LOC~~ SOLN
20.0000 [IU] | Freq: Every day | SUBCUTANEOUS | Status: DC
  Administered 2014-06-11 – 2014-06-12 (×2): 20 [IU] via SUBCUTANEOUS
  Filled 2014-06-11 (×2): qty 0.2

## 2014-06-11 MED ORDER — SODIUM CHLORIDE 0.9 % IJ SOLN: 3.0000 mL | INTRAMUSCULAR | Status: DC | PRN

## 2014-06-11 NOTE — H&P (Signed)
Advanced Heart Failure Team Consult Note    HPI:    [name] is a 68 yo female with a history of HTN, HLD, bronchial asthma on home 3L O2, DM2, CKD stage IV, atrial fibrillation (s/p aflutter ablation in 2008), tachy-brady syndrome and diastolic HF.   Admitted in 7/15 due to aspiration PNA after elective DC-CV with Dr. Einar Gip. While in hospital reverted back to AF and rate control strategy pursued. However, had trouble tolerating AF so was kept on amio with plans for eventual DC-CV  .Seen in the HF clinic on 05/25/14 with reports of being extremely dizzy, fatigued and increased SOB with ambulation. Found to have junctional rhythm with rates 27-32 bpm. She initially converted to NSR but then went back to AF with RVR. EP was consulted to assess for PPM for tachy-brady syndrome. Dr. Rayann Heman saw the patient and felt she failed amio and suggested another trial of rate control. She was discharged on lopressor 75 bid and diltiazem 180 daily. Demadex cut to 20 daily. D/cweight 226 pounds.  Weight has gone up a few pounds over the past few days. This am developed respiratory distress with CP and sats into 80s. EMS called. HR 120.   In ER CXR + mild CHF. pBNP 2594 (justl slightly up from previous). Rate controlled with IV diltiazem.    Review of Systems: [y] = yes, [ ]  = no   General: Weight gain Blue.Reese ]; Weight loss [ ] ; Anorexia [ ] ; Fatigue [ y]; Fever [ ] ; Chills [ ] ; Weakness [ ]   Cardiac: Chest pain/pressure Blue.Reese ]; Resting SOB Blue.Reese ]; Exertional SOB Blue.Reese ]; Orthopnea [ ] ; Pedal Edema [ y]; Palpitations [ ] ; Syncope [ ] ; Presyncope [ ] ; Paroxysmal nocturnal dyspnea[ ]   Pulmonary: Cough Blue.Reese ]; Wheezing[ ] ; Hemoptysis[ ] ; Sputum [ ] ; Snoring [ ]   GI: Vomiting[ ] ; Dysphagia[ ] ; Melena[ ] ; Hematochezia [ ] ; Heartburn[ ] ; Abdominal pain [ ] ; Constipation [ ] ; Diarrhea [ ] ; BRBPR [ ]   GU: Hematuria[ ] ; Dysuria [ ] ; Nocturia[ ]   Vascular: Pain in legs with walking [ ] ; Pain in feet with lying flat [ ] ; Non-healing  sores [ ] ; Stroke [ ] ; TIA [ ] ; Slurred speech [ ] ;  Neuro: Headaches[ ] ; Vertigo[ ] ; Seizures[ ] ; Paresthesias[ ] ;Blurred vision [ ] ; Diplopia [ ] ; Vision changes [ ]   Ortho/Skin: Arthritis Blue.Reese ]; Joint pain Blue.Reese ]; Muscle pain [ ] ; Joint swelling [ ] ; Back Pain [ ] ; Rash [ ]   Psych: Depression[ ] ; Anxiety[ ]   Heme: Bleeding problems [ ] ; Clotting disorders [ ] ; Anemia [ ]   Endocrine: Diabetes Blue.Reese ]; Thyroid dysfunction[ ]   Home Medications Prior to Admission medications   Medication Sig Start Date End Date Taking? Authorizing Provider  acetaminophen (TYLENOL) 500 MG tablet Take 500 mg by mouth every 6 (six) hours as needed for mild pain.   Yes Historical Provider, MD  amitriptyline (ELAVIL) 25 MG tablet Take 25 mg by mouth at bedtime.     Yes Historical Provider, MD  apixaban (ELIQUIS) 5 MG TABS tablet Take 1 tablet (5 mg total) by mouth 2 (two) times daily. 04/29/14  Yes Jolaine Artist, MD  atorvastatin (LIPITOR) 40 MG tablet Take 1 tablet (40 mg total) by mouth daily. 04/29/14  Yes Jolaine Artist, MD  dicyclomine (BENTYL) 10 MG capsule Take 10 mg by mouth daily.    Yes Historical Provider, MD  diltiazem (CARDIZEM CD) 180 MG 24 hr capsule Take 1 capsule (180 mg total) by  mouth daily. 06/03/14  Yes Rande Brunt, NP  esomeprazole (NEXIUM) 40 MG capsule Take 1 capsule (40 mg total) by mouth daily at 12 noon. 04/29/14  Yes Jolaine Artist, MD  febuxostat (ULORIC) 40 MG tablet Take 40 mg by mouth daily.    Yes Historical Provider, MD  hydrALAZINE (APRESOLINE) 25 MG tablet Take 1 tablet (25 mg total) by mouth 3 (three) times daily. 06/03/14  Yes Rande Brunt, NP  insulin glargine (LANTUS) 100 UNIT/ML injection Inject 40 Units into the skin daily as needed. 12/23/13  Yes Delfina Redwood, MD  insulin lispro (HUMALOG) 100 UNIT/ML injection Inject 0.2 mLs (20 Units total) into the skin 3 (three) times daily after meals. 12/23/13  Yes Delfina Redwood, MD  Linaclotide Rolan Lipa) 145 MCG CAPS  capsule Take 290 mcg by mouth daily.   Yes Historical Provider, MD  metoprolol tartrate (LOPRESSOR) 25 MG tablet Take 3 tablets (75 mg total) by mouth 2 (two) times daily. 06/03/14  Yes Rande Brunt, NP  potassium chloride SA (K-DUR,KLOR-CON) 20 MEQ tablet Take 1 tablet (20 mEq total) by mouth 2 (two) times daily. 04/02/14  Yes Jolaine Artist, MD  ranitidine (ZANTAC) 150 MG tablet Take 1 tablet (150 mg total) by mouth at bedtime. 04/29/14  Yes Jolaine Artist, MD  torsemide (DEMADEX) 20 MG tablet Take 1 tablet (20 mg total) by mouth daily. 06/03/14  Yes Rande Brunt, NP  Vitamin D, Ergocalciferol, (DRISDOL) 50000 UNITS CAPS capsule Take 50,000 Units by mouth every Monday.   Yes Historical Provider, MD  XOPENEX HFA 45 MCG/ACT inhaler Inhale 2 puffs into the lungs every 4 (four) hours as needed for wheezing or shortness of breath. 03/11/14  Yes Marijean Heath, NP  zolpidem (AMBIEN) 10 MG tablet Take 10 mg by mouth at bedtime.    Yes Historical Provider, MD  nitroGLYCERIN (NITROSTAT) 0.4 MG SL tablet Place 0.4 mg under the tongue every 5 (five) minutes as needed for chest pain.     Historical Provider, MD    Past Medical History: Past Medical History  Diagnosis Date  . Atrial flutter     ablated by Dr Lovena Le in 2008  . Hypertension   . Diabetes mellitus   . Diastolic heart failure     a. EF 60-65%, RV nl (03/2014)  . Asthma   . Hyperlipidemia   . Fatty liver   . Esophageal dysmotility   . Arthritis   . Sleep apnea     wears CPAP  . Fatty tumor fatty tumor back  . Coronary atherosclerosis of native coronary artery   . Morbid obesity   . Myocardial infarction 2009  . Heart murmur   . Peripheral vascular disease   . GERD (gastroesophageal reflux disease)     barrets esophagus  . Anginal pain     occ; non-ischemic Lexiscan 09/2012  . Kidney disease     CKD stage IV (Dr. Erling Cruz)  . Complication of anesthesia     " DIFFICULTY BREATHING "  . Persistent atrial  fibrillation   . Tachycardia-bradycardia   . [name] esophagus   . Hiatal hernia   . Esophageal dysmotilities     Past Surgical History: Past Surgical History  Procedure Laterality Date  . Coronary angioplasty with stent placement    . Breast lumpectomy      right  . Tubal ligation    . Tonsillectomy    . Total knee arthroplasty Right 12/15/2013    Procedure:  RIGHT TOTAL KNEE ARTHROPLASTY;  Surgeon: Alta Corning, MD;  Location: [place];  Service: Orthopedics;  Laterality: Right;  . Cardioversion N/A 03/03/2014    Procedure: CARDIOVERSION;  Surgeon: Laverda Page, MD;  Location: [facility] ENDOSCOPY;  Service: Cardiovascular;  Laterality: N/A;  . Atrial flutter ablation  2008    CTI ablation by Dr Lovena Le    Family History: Family History  Problem Relation Age of Onset  . Heart disease Mother   . Cancer Mother     bladder  . Kidney disease Mother   . Ovarian cancer Daughter   . Stomach cancer Maternal Uncle   . Colon cancer Maternal Aunt   . Esophageal cancer Neg Hx     Social History: History   Social History  . Marital Status: Widowed    Spouse Name: N/A    Number of Children: 3  . Years of Education: N/A   Social History Main Topics  . Smoking status: Former Research scientist (life sciences)  . Smokeless tobacco: Never Used     Comment: 05/2014  QUIT OVER 20 YEARS AGO "  . Alcohol Use: No  . Drug Use: No  . Sexual Activity: Not Currently   Other Topics Concern  . None   Social History Narrative  . None    Allergies:  Allergies  Allergen Reactions  . Codeine Nausea And Vomiting  . Penicillins Nausea And Vomiting  . Sulfa Antibiotics Itching and Nausea And Vomiting    "everything I seen was red"  . Other Itching    Adhesive from ekg leads    Objective:    Vital Signs:   Temp:  [97.9 F (36.6 C)-98.2 F (36.8 C)] 97.9 F (36.6 C) (10/08 1300) Pulse Rate:  [77-108] 92 (10/08 1300) Resp:  [18] 18 (10/08 1300) BP: (124-134)/(67-80) 124/80 mmHg (10/08 0948) SpO2:  [99 %-100 %]  99 % (10/08 0552) Weight:  [102.014 kg (224 lb 14.4 oz)] 102.014 kg (224 lb 14.4 oz) (10/08 0552) Last BM Date: 06/10/14 Filed Weights   06/09/14 1819 06/10/14 0435 06/11/14 0552  Weight: 104.6 kg (230 lb 9.6 oz) 102.604 kg (226 lb 3.2 oz) 102.014 kg (224 lb 14.4 oz)    Physical Exam:  General: Obese, NAD, lying in bed  HEENT: normal  Neck: supple. JVP8-9; Carotids 2+ bilat; no bruits. No lymphadenopathy or thryomegaly appreciate  Cor: PMI nonpalpbale. Distant. Irregular. No obvious murmur  Lungs: Diminished in the bases  Abdomen: Obese soft, nontender. Non-distended. No hepatosplenomegaly. No bruits or masses. Good bowel sounds.  Extremities: no cyanosis, clubbing, rash, warm. Tr edema  Neuro: alert & orientedx3, cranial nerves grossly intact. moves all 4 extremities w/o difficulty. Affect   Telemetry: AF 90-105  Labs: Basic Metabolic Panel:  Recent Labs Lab 06/09/14 1105 06/10/14 0730 06/11/14 1300  NA 140 142 141  K 4.4 3.7 3.9  CL 105 103 102  CO2 21 29 24   GLUCOSE 101* 151* 220*  BUN 17 18 19   CREATININE 2.23* 2.14* 2.22*  CALCIUM 9.4 9.1 9.4    Liver Function Tests: No results found for this basename: AST, ALT, ALKPHOS, BILITOT, PROT, ALBUMIN,  in the last 168 hours No results found for this basename: LIPASE, AMYLASE,  in the last 168 hours No results found for this basename: AMMONIA,  in the last 168 hours  CBC:  Recent Labs Lab 06/09/14 1105 06/11/14 1300  WBC 8.5 6.7  HGB 12.8 12.6  HCT 39.9 38.9  MCV 91.5 91.1  PLT 268 303  Cardiac Enzymes:  Recent Labs Lab 06/09/14 1843 06/10/14 0010 06/10/14 0730  TROPONINI <0.30 <0.30 <0.30    BNP: BNP (last 3 results)  Recent Labs  03/03/14 1851 05/25/14 1808 06/09/14 1105  PROBNP 1531.0* 2123.0* 2594.0*    CBG:  Recent Labs Lab 06/10/14 1044 06/10/14 1649 06/10/14 2113 06/11/14 0617 06/11/14 1052  GLUCAP 198* 194* 174* 218* 258*    Coagulation Studies:  Recent Labs   06/09/14 1105  LABPROT 16.0*  INR 1.28    Other results: EKG:AF with RVR  Imaging: Dg Chest 2 View  06/11/2014   CLINICAL DATA:  Acute  on chronic dyspnea  EXAM: CHEST  2 VIEW  COMPARISON:  06/09/2014  FINDINGS: Cardiomediastinal silhouette is stable. Persistent residual mild interstitial prominence bilaterally without convincing pulmonary edema. No segmental infiltrate. Mild degenerative changes thoracic spine.  IMPRESSION: Persistent residual mild interstitial prominence bilaterally without convincing pulmonary edema. No segmental infiltrate.   Electronically Signed   By: Lahoma Crocker M.D.   On: 06/11/2014 15:16        Assessment:   1. A/C respiratory hypoxemic failure 2. A/C diastolic HF 3. Chronic AF with RVR 4. Tachy-brady syndrome 5. CKD, stage IV  Plan/Discussion:    Volume status only mildly elevated. AF rate now controlled. Will attempt to diurese in ER and try to get her to a point where she will be able to go home on increased torsemide dose 40/20. Will also increase diltiazem 240 daily. She has f/u in HF Clinic on Friday.   Give Lasix 80 IV now and reassess in 2 hours.   D/w ER team  Length of Stay: 2   Genevive Bi 06/11/2014, 3:30 PM  Advanced Heart Failure Team Pager [phone] (M-F; 7a - 4p)  Please contact [facility] for night-coverage after hours (4p -7a ) and weekends on [email or web address]

## 2014-06-11 NOTE — Progress Notes (Signed)
Advanced Heart Failure Rounding Note   Subjective:    [name] is a 68 yo female with a history of HTN, HLD, bronchial asthma on home 3L O2, DM2, CKD stage IV, atrial fibrillation (s/p aflutter ablation in 2008), tachy-brady syndrome and diastolic HF.   Admitted in 7/15 due to aspiration PNA after elective DC-CV with Dr. Einar Gip. While in hospital reverted back to AF and rate control strategy pursued. However, had trouble tolerating AF so was kept on amio with plans for eventual DC-CV   Evaluated in the HF clinic on 05/25/14 with reports of being extremely dizzy, fatigued and increased SOB with ambulation. Found to have junctional rhythm with rates 27-32 bpm. She initially converted to NSR but then went back to AF with RVR. EP was consulted to assess for PPM for tachy-brady syndrome. Dr. Rayann Heman saw the patient and felt she failed amio and suggested another trial of rate control. She was discharged on lopressor 75 bid and diltiazem 180 daily. Demadex cut to 20 daily. D/Cweight 226 pounds.   Weight has gone up a few pounds over the past few days. This am developed respiratory distress with CP and sats into 80s. EMS called. HR 120.   Admitted with increased dyspnea and tachycardia. Placed on IV diltiazem and later transitioned to po cardizem. Diuresed with IV lasix in the ED but had ongoing dyspnea so she was admitted.  CEs negative. Pro BNP up slightly.   Brisk diuresis over night. Negative 2 liters. Weight down 2 more pounds. Still with dyspnea on exertion.   BMET pending.   CXR- Mild pulmonary edema.. ? Infiltrates.    Objective:   Weight Range:  Vital Signs:   Temp:  [97.9 F (36.6 C)-98.2 F (36.8 C)] 97.9 F (36.6 C) (10/08 0552) Pulse Rate:  [77-108] 108 (10/08 0948) Resp:  [18] 18 (10/08 0552) BP: (124-134)/(67-83) 124/80 mmHg (10/08 0948) SpO2:  [99 %-100 %] 99 % (10/08 0552) Weight:  [224 lb 14.4 oz (102.014 kg)] 224 lb 14.4 oz (102.014 kg) (10/08 0552) Last BM Date:  06/10/14  Weight change: Filed Weights   06/09/14 1819 06/10/14 0435 06/11/14 0552  Weight: 230 lb 9.6 oz (104.6 kg) 226 lb 3.2 oz (102.604 kg) 224 lb 14.4 oz (102.014 kg)    Intake/Output:   Intake/Output Summary (Last 24 hours) at 06/11/14 1056 Last data filed at 06/11/14 0948  Gross per 24 hour  Intake    940 ml  Output   3050 ml  Net  -2110 ml     Physical Exam:  General: Obese, NAD, sitting on the sitting in chairl   HEENT: normal  Neck: supple. JVP ?8-9; Carotids 2+ bilat; no bruits. No lymphadenopathy or thryomegaly appreciate  Cor: PMI nonpalpable. Decreased in the bases.  Irregular. No obvious murmur  Lungs: Diminished in the bases  Abdomen: Obese soft, nontender. Non-distended. No hepatosplenomegaly. No bruits or masses. Good bowel sounds.  Extremities: no cyanosis, clubbing, rash, warm. No edema.   Neuro: alert & orientedx3, cranial nerves grossly intact. moves all 4 extremities w/o difficulty. Affect  Telemetry: AF 80s-105     Labs: Basic Metabolic Panel:  Recent Labs Lab 06/09/14 1105 06/10/14 0730  NA 140 142  K 4.4 3.7  CL 105 103  CO2 21 29  GLUCOSE 101* 151*  BUN 17 18  CREATININE 2.23* 2.14*  CALCIUM 9.4 9.1    Liver Function Tests: No results found for this basename: AST, ALT, ALKPHOS, BILITOT, PROT, ALBUMIN,  in the last 168  hours No results found for this basename: LIPASE, AMYLASE,  in the last 168 hours No results found for this basename: AMMONIA,  in the last 168 hours  CBC:  Recent Labs Lab 06/09/14 1105  WBC 8.5  HGB 12.8  HCT 39.9  MCV 91.5  PLT 268    Cardiac Enzymes:  Recent Labs Lab 06/09/14 1843 06/10/14 0010 06/10/14 0730  TROPONINI <0.30 <0.30 <0.30    BNP: BNP (last 3 results)  Recent Labs  03/03/14 1851 05/25/14 1808 06/09/14 1105  PROBNP 1531.0* 2123.0* 2594.0*     Other results:  EKG:   Imaging: Dg Chest Portable 1 View  06/09/2014   CLINICAL DATA:  Midchest pain, onset 1 day ago.  Shortness of breath.  EXAM: PORTABLE CHEST - 1 VIEW  COMPARISON:  04/02/2014.  FINDINGS: Mediastinum and hilar structures normal. Cardiomegaly of mild pulmonary venous congestion. Bilateral pulmonary alveolar infiltrates noted. These changes may be related congestive heart failure with pulmonary edema. Pneumonia cannot be excluded. No pleural effusion or pneumothorax. No acute bony abnormality.  IMPRESSION: 1. Findings suggesting congestive heart failure with mild pulmonary edema.  2. Pneumonia cannot be excluded. Similar infiltrates are noted on prior study 04/02/2014. These infiltrates could be recurrent or persistent. Close follow-up chest x-rays to demonstrate clearing suggested.   Electronically Signed   By: Marcello Moores  Register   On: 06/09/2014 12:26     Medications:     Scheduled Medications: . amitriptyline  25 mg Oral QHS  . apixaban  5 mg Oral BID  . atorvastatin  40 mg Oral q1800  . dicyclomine  10 mg Oral Daily  . diltiazem  360 mg Oral Daily  . famotidine  20 mg Oral QHS  . febuxostat  40 mg Oral Daily  . furosemide  80 mg Intravenous BID  . hydrALAZINE  25 mg Oral TID  . insulin aspart  0-20 Units Subcutaneous TID WC  . Linaclotide  290 mcg Oral Daily  . metoprolol succinate  50 mg Oral BID  . pantoprazole  80 mg Oral Q1200  . piperacillin-tazobactam (ZOSYN)  IV  3.375 g Intravenous 3 times per day  . potassium chloride SA  20 mEq Oral BID  . vancomycin  1,500 mg Intravenous Q24H  . zolpidem  5 mg Oral QHS    Infusions:    PRN Medications: acetaminophen, levalbuterol, nitroGLYCERIN, ondansetron (ZOFRAN) IV   Assessment:  1. A/C respiratory hypoxemic failure  2. A/C diastolic HF  3. Chronic AF with RVR  4. Tachy-brady syndrome  5. CKD, stage IV 6. DM hgb A1C 7.4    Plan/Discussion:    Volume status improved. Weight down 6 pounds. Ongoing mild dyspnea with exertion. Continue IV lasix 80 mg twice a day. May need RHC to further sort out dyspnea. BMET pending.      CXR with Infiltrates. No fever. CBC pending. On Vancomycin and Zosyn.   Continue lopressor 75 mg twice a day and diltiazem 360 mg daily for rate control.  On apixaban.   Glucose uncontrolled. Adjust insulin.   Consult cardiac rehab.   WIll need to resume [facility] once discharged. Had [facility] prior to admit. Length of Stay: 2   CLEGG,AMY NP-C  06/11/2014, 10:56 AM  Advanced Heart Failure Team Pager [phone] (M-F; [place])  Please contact [name] for night-coverage after hours (4p -7a ) and weekends on [email or web address]  Patient seen with NP, agree with the above note.  She continues to be short of breath with minimal exertion.  She has diuresed well but it does not seem to have helped her breathing.  We are treating her for possible HCAP.  She remains in atrial fibrillation, rate controlled.  Volume status is difficult but may still have some fluid overload.  - Would continue current IV Lasix today, need BMET (creatinine has been stably elevated with diuresis).  - RHC tomorrow to assess filling pressures => Hold Eliquis tonight and tomorrow, use right brachial access.  - Continue antibiotics for now, will get PA/lateral CXR.  - She asks about trying another cardioversion.  Will ask EP to see her.  She has failed DCCV x 2, the last time on amiodarone.  I do not think she is a good Tikosyn candidate with her CKD.    [name] 06/11/2014 12:09 PM

## 2014-06-11 NOTE — Evaluation (Signed)
Physical Therapy Evaluation Patient Details Name: [name] MRN: [id number] DOB: [birth year] Today's Date: 06/11/2014   History of Present Illness  [name] is a 68 year old female with HTN, acute on chronic respiratory failure and CHF who was recent SNF admission with weakness who was now admitted for cardioversion.  Her procedure was not done and controlled on meds.   Clinical Impression  Pt was seen for check of ability to go home with limited assistance, but is not able to fully handle independent gait.  Her plan is to go with daughter who works, but then to have Psychologist, counselling in AM for personal care.  Will need 24/7 assistance if possible.    Follow Up Recommendations Home health PT;Supervision/Assistance - 24 hour    Equipment Recommendations  None recommended by PT    Recommendations for Other Services       Precautions / Restrictions Precautions Precautions: None Restrictions Weight Bearing Restrictions: No      Mobility  Bed Mobility Overal bed mobility: Modified Independent                Transfers Overall transfer level: Needs assistance Equipment used: Rolling walker (2 wheeled) Transfers: Sit to/from Stand;Stand Pivot Transfers Sit to Stand: Min guard Stand pivot transfers: Min guard       General transfer comment: Remembers to reach for chair before sitting  Ambulation/Gait Ambulation/Gait assistance: Min guard Ambulation Distance (Feet): 200 Feet Assistive device: Rolling walker (2 wheeled) Gait Pattern/deviations: Decreased step length - right;Decreased step length - left;Decreased dorsiflexion - right;Decreased dorsiflexion - left;Drifts right/left;Wide base of support Gait velocity: reduced Gait velocity interpretation: Below normal speed for age/gender General Gait Details: Has increased tendency to drift toward obstacles in hall, not as attentive as would be best.  Stairs            Wheelchair Mobility    Modified  Rankin (Stroke Patients Only)       Balance Overall balance assessment: Needs assistance Sitting-balance support: Feet unsupported;Single extremity supported Sitting balance-Leahy Scale: Good   Postural control: Posterior lean Standing balance support: Bilateral upper extremity supported Standing balance-Leahy Scale: Fair Standing balance comment: walker very stabilizing and pt attends to it until close to sitting                             Pertinent Vitals/Pain Pain Assessment: No/denies pain BP was 134/67, pulse 91 and O2 sat 99% per nsg notes.    Home Living Family/patient expects to be discharged to:: Private residence Living Arrangements: Children Available Help at Discharge: Family;Available PRN/intermittently Type of Home: Apartment Home Access: Stairs to enter Entrance Stairs-Rails: Right Entrance Stairs-Number of Steps: 22 Home Layout: One level Home Equipment: Walker - standard;Walker - 2 wheels;Walker - 4 wheels;Tub bench Additional Comments: Will need to be alone briefly in AM but has morning assistance from a CNA for bathing and dressing    Prior Function Level of Independence: Independent with assistive device(s)               Hand Dominance   Dominant Hand: Right    Extremity/Trunk Assessment   Upper Extremity Assessment: Overall WFL for tasks assessed           Lower Extremity Assessment: Overall WFL for tasks assessed      Cervical / Trunk Assessment: Normal  Communication   Communication: No difficulties  Cognition Arousal/Alertness: Awake/alert Behavior During Therapy: WFL for tasks assessed/performed Overall  Cognitive Status: Within Functional Limits for tasks assessed                      General Comments General comments (skin integrity, edema, etc.): Pt demonstrates some safety issues that would be best addressed by having 24/7 help at home.  Her help planned is very nearly 24/7    Exercises General  Exercises - Lower Extremity Ankle Circles/Pumps: AROM;Both;20 reps Gluteal Sets: AROM;Both;10 reps Long Arc Quad: AROM;Both;10 reps Heel Slides: AROM;Both;10 reps Hip Flexion/Marching: AROM;Both;10 reps Toe Raises: AROM;Both;10 reps;Seated Heel Raises: AROM;Both;10 reps      Assessment/Plan    PT Assessment Patient needs continued PT services  PT Diagnosis Difficulty walking   PT Problem List Decreased strength;Decreased activity tolerance;Decreased balance;Decreased mobility;Decreased coordination;Decreased safety awareness  PT Treatment Interventions DME instruction;Gait training;Stair training;Functional mobility training;Therapeutic activities;Therapeutic exercise;Balance training;Neuromuscular re-education;Patient/family education   PT Goals (Current goals can be found in the Care Plan section) Acute Rehab PT Goals Patient Stated Goal: Wants to go home PT Goal Formulation: With patient Time For Goal Achievement: 06/18/14 Potential to Achieve Goals: Good    Frequency Min 3X/week   Barriers to discharge Inaccessible home environment;Decreased caregiver support Has nearly 24/7 help but will be alone from early AM until her CNA arrives    Co-evaluation               End of Session Equipment Utilized During Treatment: Oxygen Activity Tolerance: Patient tolerated treatment well Patient left: in chair;with call bell/phone within reach Nurse Communication: Mobility status         Time: [id number] PT Time Calculation (min): 27 min   Charges:   PT Evaluation $Initial PT Evaluation Tier I: 1 Procedure PT Treatments $Gait Training: 8-22 mins   PT G CodesRamond [name] 06-12-2014, 8:56 AM  [name], PT MS Acute Rehab Dept. Number: [id number]

## 2014-06-11 NOTE — Consult Note (Signed)
ELECTROPHYSIOLOGY CONSULT NOTE    Patient ID: [name] MRN: [id number], DOB/AGE: 1946-05-09 68 y.o.  Admit date: 06/09/2014 Date of Consult: 06-11-2014  Primary Physician: [name], MD Primary Cardiologist: Aundra Dubin  Reason for Consultation: atrial arrhythmias  HPI:  [name] is a 68 y.o. female with a h/o obesity, HTN, renal failure, atrial fibrillation and atrial flutter who is admitted with symptomatic atrial fibrillation She initially presented with atrial flutter with difficult to control V rates in 2008. She underwent CTI ablation by Dr Lovena Le at that time. She appears to have done well, without any further atrial arrhythmias until earlier this year. She has since been followed by Dr Haroldine Laws. She has diastolic dysfunction which has been worsened recently by atrial fibrillation. She was admitted in September of this year after being found to be in junctional rhythm on a combination of amiodarone, metoprolol and cardizem.  She returned to SR and then atrial fibrillation with a plan of rate control.  After discharge to home, she developed respiratory distress and chest pain. She returned to [facility] on 06-09-14 for further evaluation.  She has symptoms of fatigue and SOB with her atrial arrhythmias. She is anticoagulated with eliquis. She is currently being diuresed with plans for RHC tomorrow (Eliquis being held tonight and in the morning).   Last echo 03-2014 demonstrated EF 60-65%, no RWMA, LA 42.  Lab work is notable for creatinine of 2.22, BNP 2594.   Today, she denies symptoms of palpitations, chest pain, dizziness, presyncope, syncope, or neurologic sequela. The patient is tolerating medications without difficulties and is otherwise without complaint today.    Past Medical History  Diagnosis Date  . Atrial flutter     ablated by Dr Lovena Le in 2008  . Hypertension   . Diabetes mellitus   . Diastolic heart failure     a. EF 60-65%, RV nl (03/2014)  . Asthma   .  Hyperlipidemia   . Fatty liver   . Esophageal dysmotility   . Arthritis   . Sleep apnea     wears CPAP  . Fatty tumor fatty tumor back  . Coronary atherosclerosis of native coronary artery   . Morbid obesity   . Myocardial infarction 2009  . Heart murmur   . Peripheral vascular disease   . GERD (gastroesophageal reflux disease)     barrets esophagus  . Anginal pain     occ; non-ischemic Lexiscan 09/2012  . Kidney disease     CKD stage IV (Dr. Erling Cruz)  . Complication of anesthesia     " DIFFICULTY BREATHING "  . Persistent atrial fibrillation   . Tachycardia-bradycardia   . [name] esophagus   . Hiatal hernia   . Esophageal dysmotilities      Surgical History:  Past Surgical History  Procedure Laterality Date  . Coronary angioplasty with stent placement    . Breast lumpectomy      right  . Tubal ligation    . Tonsillectomy    . Total knee arthroplasty Right 12/15/2013    Procedure: RIGHT TOTAL KNEE ARTHROPLASTY;  Surgeon: Alta Corning, MD;  Location: [place];  Service: Orthopedics;  Laterality: Right;  . Cardioversion N/A 03/03/2014    Procedure: CARDIOVERSION;  Surgeon: Laverda Page, MD;  Location: [place];  Service: Cardiovascular;  Laterality: N/A;  . Atrial flutter ablation  2008    CTI ablation by Dr Lovena Le     Prescriptions prior to admission  Medication Sig Dispense Refill  .  acetaminophen (TYLENOL) 500 MG tablet Take 500 mg by mouth every 6 (six) hours as needed for mild pain.      Marland Kitchen amitriptyline (ELAVIL) 25 MG tablet Take 25 mg by mouth at bedtime.        Marland Kitchen apixaban (ELIQUIS) 5 MG TABS tablet Take 1 tablet (5 mg total) by mouth 2 (two) times daily.  60 tablet  2  . atorvastatin (LIPITOR) 40 MG tablet Take 1 tablet (40 mg total) by mouth daily.  90 tablet  3  . dicyclomine (BENTYL) 10 MG capsule Take 10 mg by mouth daily.       Marland Kitchen diltiazem (CARDIZEM CD) 180 MG 24 hr capsule Take 1 capsule (180 mg total) by mouth daily.  30 capsule  3  .  esomeprazole (NEXIUM) 40 MG capsule Take 1 capsule (40 mg total) by mouth daily at 12 noon.  30 capsule  0  . febuxostat (ULORIC) 40 MG tablet Take 40 mg by mouth daily.       . hydrALAZINE (APRESOLINE) 25 MG tablet Take 1 tablet (25 mg total) by mouth 3 (three) times daily.  90 tablet  3  . insulin glargine (LANTUS) 100 UNIT/ML injection Inject 40 Units into the skin daily as needed.      . insulin lispro (HUMALOG) 100 UNIT/ML injection Inject 0.2 mLs (20 Units total) into the skin 3 (three) times daily after meals.  10 mL  11  . Linaclotide (LINZESS) 145 MCG CAPS capsule Take 290 mcg by mouth daily.      . metoprolol tartrate (LOPRESSOR) 25 MG tablet Take 3 tablets (75 mg total) by mouth 2 (two) times daily.  180 tablet  3  . potassium chloride SA (K-DUR,KLOR-CON) 20 MEQ tablet Take 1 tablet (20 mEq total) by mouth 2 (two) times daily.  75 tablet  6  . ranitidine (ZANTAC) 150 MG tablet Take 1 tablet (150 mg total) by mouth at bedtime.  30 tablet  0  . torsemide (DEMADEX) 20 MG tablet Take 1 tablet (20 mg total) by mouth daily.  30 tablet  3  . Vitamin D, Ergocalciferol, (DRISDOL) 50000 UNITS CAPS capsule Take 50,000 Units by mouth every Monday.      Penne Lash HFA 45 MCG/ACT inhaler Inhale 2 puffs into the lungs every 4 (four) hours as needed for wheezing or shortness of breath.  1 Inhaler  1  . zolpidem (AMBIEN) 10 MG tablet Take 10 mg by mouth at bedtime.       . nitroGLYCERIN (NITROSTAT) 0.4 MG SL tablet Place 0.4 mg under the tongue every 5 (five) minutes as needed for chest pain.         Inpatient Medications:  . amitriptyline  25 mg Oral QHS  . atorvastatin  40 mg Oral q1800  . dicyclomine  10 mg Oral Daily  . diltiazem  360 mg Oral Daily  . famotidine  20 mg Oral QHS  . febuxostat  40 mg Oral Daily  . furosemide  80 mg Intravenous BID  . hydrALAZINE  25 mg Oral TID  . insulin aspart  0-20 Units Subcutaneous TID WC  . insulin glargine  20 Units Subcutaneous Daily  . Linaclotide  290  mcg Oral Daily  . metoprolol succinate  50 mg Oral BID  . pantoprazole  80 mg Oral Q1200  . piperacillin-tazobactam (ZOSYN)  IV  3.375 g Intravenous 3 times per day  . potassium chloride SA  20 mEq Oral BID  . vancomycin  1,500  mg Intravenous Q24H  . zolpidem  5 mg Oral QHS    Allergies:  Allergies  Allergen Reactions  . Codeine Nausea And Vomiting  . Penicillins Nausea And Vomiting  . Sulfa Antibiotics Itching and Nausea And Vomiting    "everything I seen was red"  . Other Itching    Adhesive from ekg leads    History   Social History  . Marital Status: Widowed    Spouse Name: N/A    Number of Children: 3  . Years of Education: N/A   Occupational History  . Not on file.   Social History Main Topics  . Smoking status: Former Research scientist (life sciences)  . Smokeless tobacco: Never Used     Comment: 05/2014  QUIT OVER 20 YEARS AGO "  . Alcohol Use: No  . Drug Use: No  . Sexual Activity: Not Currently   Other Topics Concern  . Not on file   Social History Narrative  . No narrative on file     Family History  Problem Relation Age of Onset  . Heart disease Mother   . Cancer Mother     bladder  . Kidney disease Mother   . Ovarian cancer Daughter   . Stomach cancer Maternal Uncle   . Colon cancer Maternal Aunt   . Esophageal cancer Neg Hx     BP 124/80  Pulse 92  Temp(Src) 97.9 F (36.6 C) (Oral)  Resp 18  Ht 5\' 4"  (1.626 m)  Wt 224 lb 14.4 oz (102.014 kg)  BMI 38.59 kg/m2  SpO2 99%  Physical Exam: Physical Exam: Filed Vitals:   06/11/14 0552 06/11/14 0948 06/11/14 1300 06/11/14 2006  BP: 134/67 124/80 120/77 116/90  Pulse: 91 108 92 77  Temp: 97.9 F (36.6 C)  97.9 F (36.6 C) 98.4 F (36.9 C)  TempSrc: Oral  Oral Oral  Resp: 18  18 18   Height:      Weight: 224 lb 14.4 oz (102.014 kg)     SpO2: 99%  99% 99%    GEN- The patient is overweight and ill appearing, alert and oriented x 3 today.   Head- normocephalic, atraumatic Eyes-  Sclera clear, conjunctiva  pink Ears- hearing intact Oropharynx- clear Neck- supple, Lungs- relatively clear, normal work of breathing Heart- irregular rate and rhythm, no murmurs, rubs or gallops, PMI not laterally displaced GI- soft, NT, ND, + BS Extremities- no clubbing, cyanosis, or edema  MS- no significant deformity or atrophy Skin- no rash or lesion Psych- euthymic mood, full affect Neuro- strength and sensation are intact    Labs:   Lab Results  Component Value Date   WBC 6.7 06/11/2014   HGB 12.6 06/11/2014   HCT 38.9 06/11/2014   MCV 91.1 06/11/2014   PLT 303 06/11/2014    Recent Labs Lab 06/11/14 1300  NA 141  K 3.9  CL 102  CO2 24  BUN 19  CREATININE 2.22*  CALCIUM 9.4  GLUCOSE 220*   Lab Results  Component Value Date   CKTOTAL 413* 12/18/2008   CKMB 3.3 12/18/2008   TROPONINI <0.30 06/10/2014     Radiology/Studies: Dg Chest Portable 1 View 06/09/2014   CLINICAL DATA:  Midchest pain, onset 1 day ago. Shortness of breath.  EXAM: PORTABLE CHEST - 1 VIEW  COMPARISON:  04/02/2014.  FINDINGS: Mediastinum and hilar structures normal. Cardiomegaly of mild pulmonary venous congestion. Bilateral pulmonary alveolar infiltrates noted. These changes may be related congestive heart failure with pulmonary edema. Pneumonia cannot be excluded.  No pleural effusion or pneumothorax. No acute bony abnormality.  IMPRESSION: 1. Findings suggesting congestive heart failure with mild pulmonary edema.  2. Pneumonia cannot be excluded. Similar infiltrates are noted on prior study 04/02/2014. These infiltrates could be recurrent or persistent. Close follow-up chest x-rays to demonstrate clearing suggested.   Electronically Signed   By: Marcello Moores  Register   On: 06/09/2014 12:26    EKG: likely atrial flutter (not completely typical but possibly isthmus dependant), ventricular rate 122  TELEMETRY: atrial flutter, ventricular rates 60-90's A/P 1. Persistent atrial fibrillation/ atrial flutter The patient has symptomatic  afib as well as tachycardia/ bradycardia syndrome. She has failed medical therapy with amiodarone.  She is presently well rate controlled.  I think that it would be very difficult to improve her current arrhythmia status.   She may actually require a rate control strategy long term. I think that at the present time it is reasonable to proceed with RHC to better evaluate her SOB.  Further considerations based on results.  2. Obesity  Body mass index is 38.69 kg/(m^2).  Weight loss is strongly advised.    3. OSA  Compliance with CPAP is also advised    No new recs at this time. I do not feel that cardioversion would be successful or offer value presently. Proceed with RHC. Could possibly consider ablation electively as an outpatient, though anticipated success rates are probably 50-60% at best and carry increased risks with her current state.  Electrophysiology team to see as needed while here. Please call with questions.

## 2014-06-12 ENCOUNTER — Ambulatory Visit (INDEPENDENT_AMBULATORY_CARE_PROVIDER_SITE_OTHER): Payer: Commercial Managed Care - HMO | Admitting: Ophthalmology

## 2014-06-12 ENCOUNTER — Inpatient Hospital Stay (HOSPITAL_COMMUNITY): Payer: Commercial Managed Care - HMO

## 2014-06-12 ENCOUNTER — Encounter (HOSPITAL_COMMUNITY)
Admission: EM | Disposition: A | Discharge status: Home / Self Care | Payer: Self-pay | Source: Home / Self Care | Attending: Internal Medicine

## 2014-06-12 DIAGNOSIS — I509 Heart failure, unspecified: Secondary | ICD-10-CM

## 2014-06-12 HISTORY — PX: RIGHT HEART CATHETERIZATION: SHX5447

## 2014-06-12 LAB — POCT I-STAT 3, VENOUS BLOOD GAS (G3P V)
Acid-Base Excess: 4 mmol/L — ABNORMAL HIGH (ref 0.0–2.0)
Acid-Base Excess: 5 mmol/L — ABNORMAL HIGH (ref 0.0–2.0)
Bicarbonate: 29.8 mEq/L — ABNORMAL HIGH (ref 20.0–24.0)
Bicarbonate: 31.5 mEq/L — ABNORMAL HIGH (ref 20.0–24.0)
O2 Saturation: 68 %
O2 Saturation: 71 %
TCO2: 31 mmol/L (ref 0–100)
TCO2: 33 mmol/L (ref 0–100)
pCO2, Ven: 50.6 mmHg — ABNORMAL HIGH (ref 45.0–50.0)
pCO2, Ven: 53.6 mmHg — ABNORMAL HIGH (ref 45.0–50.0)
pH, Ven: 7.378 — ABNORMAL HIGH (ref 7.250–7.300)
pH, Ven: 7.378 — ABNORMAL HIGH (ref 7.250–7.300)
pO2, Ven: 37 mmHg (ref 30.0–45.0)
pO2, Ven: 39 mmHg (ref 30.0–45.0)

## 2014-06-12 LAB — BASIC METABOLIC PANEL
Anion gap: 15 (ref 5–15)
BUN: 20 mg/dL (ref 6–23)
CO2: 27 mEq/L (ref 19–32)
Calcium: 8.9 mg/dL (ref 8.4–10.5)
Chloride: 103 mEq/L (ref 96–112)
Creatinine, Ser: 2.45 mg/dL — ABNORMAL HIGH (ref 0.50–1.10)
GFR calc Af Amer: 22 mL/min — ABNORMAL LOW (ref >90)
GFR calc non Af Amer: 19 mL/min — ABNORMAL LOW (ref >90)
Glucose, Bld: 233 mg/dL — ABNORMAL HIGH (ref 70–99)
Potassium: 3.7 mEq/L (ref 3.7–5.3)
Sodium: 145 mEq/L (ref 137–147)

## 2014-06-12 LAB — GLUCOSE, CAPILLARY
Glucose-Capillary: 229 mg/dL — ABNORMAL HIGH (ref 70–99)
Glucose-Capillary: 217 mg/dL — ABNORMAL HIGH (ref 70–99)
Glucose-Capillary: 206 mg/dL — ABNORMAL HIGH (ref 70–99)
Glucose-Capillary: 266 mg/dL — ABNORMAL HIGH (ref 70–99)

## 2014-06-12 LAB — CBC
HCT: 38.9 % (ref 36.0–46.0)
Hemoglobin: 12.4 g/dL (ref 12.0–15.0)
MCH: 29.7 pg (ref 26.0–34.0)
MCHC: 31.9 g/dL (ref 30.0–36.0)
MCV: 93.1 fL (ref 78.0–100.0)
Platelets: 265 10*3/uL (ref 150–400)
RBC: 4.18 MIL/uL (ref 3.87–5.11)
RDW: 16.7 % — ABNORMAL HIGH (ref 11.5–15.5)
WBC: 5.6 10*3/uL (ref 4.0–10.5)

## 2014-06-12 SURGERY — RIGHT HEART CATH
Anesthesia: LOCAL

## 2014-06-12 MED ORDER — SODIUM CHLORIDE 0.9 % IV SOLN: 250.0000 mL | INTRAVENOUS | Status: DC | PRN

## 2014-06-12 MED ORDER — LEVOFLOXACIN 500 MG PO TABS: 500.0000 mg | ORAL_TABLET | Freq: Every day | ORAL | Status: DC

## 2014-06-12 MED ORDER — HYDRALAZINE HCL 20 MG/ML IJ SOLN
INTRAMUSCULAR | Status: AC
  Filled 2014-06-12: qty 1

## 2014-06-12 MED ORDER — ONDANSETRON HCL 4 MG/2ML IJ SOLN: 4.0000 mg | Freq: Four times a day (QID) | INTRAMUSCULAR | Status: DC | PRN

## 2014-06-12 MED ORDER — DILTIAZEM HCL ER COATED BEADS 360 MG PO CP24: 360.0000 mg | ORAL_CAPSULE | Freq: Every day | ORAL | Status: DC

## 2014-06-12 MED ORDER — HYDRALAZINE HCL 20 MG/ML IJ SOLN
10.0000 mg | Freq: Once | INTRAMUSCULAR | Status: AC
  Administered 2014-06-12: 10 mg via INTRAVENOUS

## 2014-06-12 MED ORDER — APIXABAN 5 MG PO TABS
5.0000 mg | ORAL_TABLET | Freq: Two times a day (BID) | ORAL | Status: DC
  Filled 2014-06-12 (×3): qty 1

## 2014-06-12 MED ORDER — FENTANYL CITRATE 0.05 MG/ML IJ SOLN
INTRAMUSCULAR | Status: AC
  Filled 2014-06-12: qty 2

## 2014-06-12 MED ORDER — MIDAZOLAM HCL 2 MG/2ML IJ SOLN
INTRAMUSCULAR | Status: AC
  Filled 2014-06-12: qty 2

## 2014-06-12 MED ORDER — TORSEMIDE 20 MG PO TABS: 20.0000 mg | ORAL_TABLET | Freq: Two times a day (BID) | ORAL | Status: DC

## 2014-06-12 MED ORDER — ACETAMINOPHEN 325 MG PO TABS: 650.0000 mg | ORAL_TABLET | ORAL | Status: DC | PRN

## 2014-06-12 MED ORDER — HEPARIN (PORCINE) IN NACL 2-0.9 UNIT/ML-% IJ SOLN
INTRAMUSCULAR | Status: AC
  Filled 2014-06-12: qty 1000

## 2014-06-12 MED ORDER — LIDOCAINE HCL (PF) 1 % IJ SOLN
INTRAMUSCULAR | Status: AC
  Filled 2014-06-12: qty 30

## 2014-06-12 MED ORDER — SODIUM CHLORIDE 0.9 % IJ SOLN: 3.0000 mL | INTRAMUSCULAR | Status: DC | PRN

## 2014-06-12 MED ORDER — SODIUM CHLORIDE 0.9 % IJ SOLN: 3.0000 mL | Freq: Two times a day (BID) | INTRAMUSCULAR | Status: DC

## 2014-06-12 NOTE — Progress Notes (Signed)
Discharge education completed by RN. Pt  received a copy of discharge paperwork and confirms understanding of follow up appointments and discharge medications. Patient denies any questions at this time. IV removed, site is within normal limits. Pt will discharge from the unit via wheelchair. 

## 2014-06-12 NOTE — H&P (View-Only) (Signed)
Advanced Heart Failure Rounding Note   Subjective:    [name] is a 68 yo female with a history of HTN, HLD, bronchial asthma on home 3L O2, DM2, CKD stage IV, atrial fibrillation (s/p aflutter ablation in 2008), tachy-brady syndrome and diastolic HF.   Admitted in 7/15 due to aspiration PNA after elective DC-CV with Dr. Einar Gip. While in hospital reverted back to AF and rate control strategy pursued. However, had trouble tolerating AF so was kept on amio with plans for eventual DC-CV   Evaluated in the HF clinic on 05/25/14 with reports of being extremely dizzy, fatigued and increased SOB with ambulation. Found to have junctional rhythm with rates 27-32 bpm. She initially converted to NSR but then went back to AF with RVR. EP was consulted to assess for PPM for tachy-brady syndrome. Dr. Rayann Heman saw the patient and felt she failed amio and suggested another trial of rate control. She was discharged on lopressor 75 bid and diltiazem 180 daily. Demadex cut to 20 daily. D/Cweight 226 pounds.   Weight has gone up a few pounds over the past few days. This am developed respiratory distress with CP and sats into 80s. EMS called. HR 120.   Admitted with increased dyspnea and tachycardia. Placed on IV diltiazem and later transitioned to po cardizem. Diuresed with IV lasix in the ED but had ongoing dyspnea so she was admitted.  CEs negative. Pro BNP up slightly.   Brisk diuresis over night. Negative 2 liters. Weight down 2 more pounds. Still with dyspnea on exertion.   BMET pending.   CXR- Mild pulmonary edema.. ? Infiltrates.    Objective:   Weight Range:  Vital Signs:   Temp:  [97.9 F (36.6 C)-98.2 F (36.8 C)] 97.9 F (36.6 C) (10/08 0552) Pulse Rate:  [77-108] 108 (10/08 0948) Resp:  [18] 18 (10/08 0552) BP: (124-134)/(67-83) 124/80 mmHg (10/08 0948) SpO2:  [99 %-100 %] 99 % (10/08 0552) Weight:  [224 lb 14.4 oz (102.014 kg)] 224 lb 14.4 oz (102.014 kg) (10/08 0552) Last BM Date:  06/10/14  Weight change: Filed Weights   06/09/14 1819 06/10/14 0435 06/11/14 0552  Weight: 230 lb 9.6 oz (104.6 kg) 226 lb 3.2 oz (102.604 kg) 224 lb 14.4 oz (102.014 kg)    Intake/Output:   Intake/Output Summary (Last 24 hours) at 06/11/14 1056 Last data filed at 06/11/14 0948  Gross per 24 hour  Intake    940 ml  Output   3050 ml  Net  -2110 ml     Physical Exam:  General: Obese, NAD, sitting on the sitting in chairl   HEENT: normal  Neck: supple. JVP ?8-9; Carotids 2+ bilat; no bruits. No lymphadenopathy or thryomegaly appreciate  Cor: PMI nonpalpable. Decreased in the bases.  Irregular. No obvious murmur  Lungs: Diminished in the bases  Abdomen: Obese soft, nontender. Non-distended. No hepatosplenomegaly. No bruits or masses. Good bowel sounds.  Extremities: no cyanosis, clubbing, rash, warm. No edema.   Neuro: alert & orientedx3, cranial nerves grossly intact. moves all 4 extremities w/o difficulty. Affect  Telemetry: AF 80s-105     Labs: Basic Metabolic Panel:  Recent Labs Lab 06/09/14 1105 06/10/14 0730  NA 140 142  K 4.4 3.7  CL 105 103  CO2 21 29  GLUCOSE 101* 151*  BUN 17 18  CREATININE 2.23* 2.14*  CALCIUM 9.4 9.1    Liver Function Tests: No results found for this basename: AST, ALT, ALKPHOS, BILITOT, PROT, ALBUMIN,  in the last 168  hours No results found for this basename: LIPASE, AMYLASE,  in the last 168 hours No results found for this basename: AMMONIA,  in the last 168 hours  CBC:  Recent Labs Lab 06/09/14 1105  WBC 8.5  HGB 12.8  HCT 39.9  MCV 91.5  PLT 268    Cardiac Enzymes:  Recent Labs Lab 06/09/14 1843 06/10/14 0010 06/10/14 0730  TROPONINI <0.30 <0.30 <0.30    BNP: BNP (last 3 results)  Recent Labs  03/03/14 1851 05/25/14 1808 06/09/14 1105  PROBNP 1531.0* 2123.0* 2594.0*     Other results:  EKG:   Imaging: Dg Chest Portable 1 View  06/09/2014   CLINICAL DATA:  Midchest pain, onset 1 day ago.  Shortness of breath.  EXAM: PORTABLE CHEST - 1 VIEW  COMPARISON:  04/02/2014.  FINDINGS: Mediastinum and hilar structures normal. Cardiomegaly of mild pulmonary venous congestion. Bilateral pulmonary alveolar infiltrates noted. These changes may be related congestive heart failure with pulmonary edema. Pneumonia cannot be excluded. No pleural effusion or pneumothorax. No acute bony abnormality.  IMPRESSION: 1. Findings suggesting congestive heart failure with mild pulmonary edema.  2. Pneumonia cannot be excluded. Similar infiltrates are noted on prior study 04/02/2014. These infiltrates could be recurrent or persistent. Close follow-up chest x-rays to demonstrate clearing suggested.   Electronically Signed   By: Marcello Moores  Register   On: 06/09/2014 12:26     Medications:     Scheduled Medications: . amitriptyline  25 mg Oral QHS  . apixaban  5 mg Oral BID  . atorvastatin  40 mg Oral q1800  . dicyclomine  10 mg Oral Daily  . diltiazem  360 mg Oral Daily  . famotidine  20 mg Oral QHS  . febuxostat  40 mg Oral Daily  . furosemide  80 mg Intravenous BID  . hydrALAZINE  25 mg Oral TID  . insulin aspart  0-20 Units Subcutaneous TID WC  . Linaclotide  290 mcg Oral Daily  . metoprolol succinate  50 mg Oral BID  . pantoprazole  80 mg Oral Q1200  . piperacillin-tazobactam (ZOSYN)  IV  3.375 g Intravenous 3 times per day  . potassium chloride SA  20 mEq Oral BID  . vancomycin  1,500 mg Intravenous Q24H  . zolpidem  5 mg Oral QHS    Infusions:    PRN Medications: acetaminophen, levalbuterol, nitroGLYCERIN, ondansetron (ZOFRAN) IV   Assessment:  1. A/C respiratory hypoxemic failure  2. A/C diastolic HF  3. Chronic AF with RVR  4. Tachy-brady syndrome  5. CKD, stage IV 6. DM hgb A1C 7.4    Plan/Discussion:    Volume status improved. Weight down 6 pounds. Ongoing mild dyspnea with exertion. Continue IV lasix 80 mg twice a day. May need RHC to further sort out dyspnea. BMET pending.      CXR with Infiltrates. No fever. CBC pending. On Vancomycin and Zosyn.   Continue lopressor 75 mg twice a day and diltiazem 360 mg daily for rate control.  On apixaban.   Glucose uncontrolled. Adjust insulin.   Consult cardiac rehab.   WIll need to resume [place] once discharged. Had [facility] prior to admit. Length of Stay: 2   CLEGG,AMY NP-C  06/11/2014, 10:56 AM  Advanced Heart Failure Team Pager [phone] (M-F; [place])  Please contact [facility] for night-coverage after hours (4p -7a ) and weekends on [email or web address]  Patient seen with NP, agree with the above note.  She continues to be short of breath with minimal exertion.  She has diuresed well but it does not seem to have helped her breathing.  We are treating her for possible HCAP.  She remains in atrial fibrillation, rate controlled.  Volume status is difficult but may still have some fluid overload.  - Would continue current IV Lasix today, need BMET (creatinine has been stably elevated with diuresis).  - RHC tomorrow to assess filling pressures => Hold Eliquis tonight and tomorrow, use right brachial access.  - Continue antibiotics for now, will get PA/lateral CXR.  - She asks about trying another cardioversion.  Will ask EP to see her.  She has failed DCCV x 2, the last time on amiodarone.  I do not think she is a good Tikosyn candidate with her CKD.    [name] 06/11/2014 12:09 PM

## 2014-06-12 NOTE — Discharge Summary (Signed)
CARDIOLOGY DISCHARGE SUMMARY   Patient ID: [name] MRN: [id number] DOB/AGE: 68-Apr-1947 68 y.o.  Admit date: 06/09/2014 Discharge date: 06/12/2014  PCP: [name], MD Primary Cardiologist: Dr. Einar Gip CHF MD: Dr. Haroldine Laws  Primary Discharge Diagnosis:    Acute on chronic respiratory failure Secondary Discharge Diagnosis:    Acute on chronic diastolic congestive heart failure   Consults: Electrophysiology  Procedures: Two-view chest x-ray  Hospital Course: [name] is a 68 y.o. female with a history of HTN, HLD, bronchial asthma on home 3L O2, DM2, CKD stage IV, atrial fibrillation (s/p aflutter ablation in 2008), tachy-brady syndrome and diastolic HF.   Admitted in 7/15 due to aspiration PNA after elective DC-CV with Dr. Einar Gip. While in hospital reverted back to AF and rate control strategy pursued. However, had trouble tolerating AF so was kept on amio with plans for eventual DC-CV   Evaluated in the HF clinic on 05/25/14 with reports of being extremely dizzy, fatigued and increased SOB with ambulation. Found to have junctional rhythm with rates 27-32 bpm. She initially converted to NSR but then went back to AF with RVR. EP was consulted to assess for PPM for tachy-brady syndrome. Dr. Rayann Heman saw the patient and felt she failed amio and suggested another trial of rate control. She was discharged on lopressor 75 bid and diltiazem 180 daily. Demadex cut to 20 daily. D/Cweight 226 pounds.   Admitted with increased dyspnea and tachycardia. Placed on IV diltiazem and later transitioned to po cardizem. Diuresed with IV lasix in the ED but had ongoing dyspnea so she was admitted. CEs negative. Pro BNP up slightly.   Weight stable overnight. Feels better but still with some dyspnea.   RHC today with normal filling pressures and output. On abx for possible PNA. CXR without infiltrate.   She was seen by Dr. Haroldine Laws on 10/09 and all data were reviewed. Right heart  catheterization report is below, pressures were normal and atrial fib is suspected to be the culprit. Volume status optimized. If dyspnea persists will need to consider AF ablation but given comorbidities, success rate in 50-60% range. Will continue with rate control strategy for now (has failed amio).   Her respiratory status is at baseline and no further inpatient workup is indicated. She is considered stable for discharge, to follow up as an outpatient.   Labs:   Lab Results  Component Value Date   WBC 5.6 06/12/2014   HGB 12.4 06/12/2014   HCT 38.9 06/12/2014   MCV 93.1 06/12/2014   PLT 265 06/12/2014     Recent Labs Lab 06/12/14 0441  NA 145  K 3.7  CL 103  CO2 27  BUN 20  CREATININE 2.45*  CALCIUM 8.9  GLUCOSE 233*    Recent Labs  06/09/14 1843 06/10/14 0010 06/10/14 0730  TROPONINI <0.30 <0.30 <0.30    Pro B Natriuretic peptide (BNP)  Date/Time Value Ref Range Status  06/09/2014 11:05 AM 2594.0* 0 - 125 pg/mL Final  05/25/2014  6:08 PM 2123.0* 0 - 125 pg/mL Final      Radiology: Dg Chest 2 View 06/11/2014   CLINICAL DATA:  Acute  on chronic dyspnea  EXAM: CHEST  2 VIEW  COMPARISON:  06/09/2014  FINDINGS: Cardiomediastinal silhouette is stable. Persistent residual mild interstitial prominence bilaterally without convincing pulmonary edema. No segmental infiltrate. Mild degenerative changes thoracic spine.  IMPRESSION: Persistent residual mild interstitial prominence bilaterally without convincing pulmonary edema. No segmental infiltrate.   Electronically Signed  By: [name] M.D.   On: 06/11/2014 15:16   Dg Chest Portable 1 View 06/09/2014   CLINICAL DATA:  Midchest pain, onset 1 day ago. Shortness of breath.  EXAM: PORTABLE CHEST - 1 VIEW  COMPARISON:  04/02/2014.  FINDINGS: Mediastinum and hilar structures normal. Cardiomegaly of mild pulmonary venous congestion. Bilateral pulmonary alveolar infiltrates noted. These changes may be related congestive heart failure with  pulmonary edema. Pneumonia cannot be excluded. No pleural effusion or pneumothorax. No acute bony abnormality.  IMPRESSION: 1. Findings suggesting congestive heart failure with mild pulmonary edema.  2. Pneumonia cannot be excluded. Similar infiltrates are noted on prior study 04/02/2014. These infiltrates could be recurrent or persistent. Close follow-up chest x-rays to demonstrate clearing suggested.   Electronically Signed   By: Marcello Moores  Register   On: 06/09/2014 12:26    EKG: 06/09/2014 Atrial fibrillation, RVR Vent. rate 122 BPM PR interval 164 ms QRS duration 101 ms QT/QTc 362/516 ms P-R-T axes 60 46 163  Right heart cath: 06/12/2014 Findings:  RA = 7  RV = 34/0/7  PA = 36/16 (28)  PCW = 14  Fick cardiac output/index = 5.6/2.7  PVR = 2.5 WU  FA sat = 98%  PA sat = 68%, 71%  Assessment:  1. Well compensated filling pressures with minimal pulmonary HTN  Plan/Discussion:  Numbers look good. Given ongoing sx suspect she does not tolerate AF well. Will d/w Allred.   FOLLOW UP PLANS AND APPOINTMENTS Allergies  Allergen Reactions  . Codeine Nausea And Vomiting  . Penicillins Nausea And Vomiting  . Sulfa Antibiotics Itching and Nausea And Vomiting    "everything I seen was red"  . Other Itching    Adhesive from ekg leads     Medication List         acetaminophen 500 MG tablet  Commonly known as:  TYLENOL  Take 500 mg by mouth every 6 (six) hours as needed for mild pain.     amitriptyline 25 MG tablet  Commonly known as:  ELAVIL  Take 25 mg by mouth at bedtime.     apixaban 5 MG Tabs tablet  Commonly known as:  ELIQUIS  Take 1 tablet (5 mg total) by mouth 2 (two) times daily.     atorvastatin 40 MG tablet  Commonly known as:  LIPITOR  Take 1 tablet (40 mg total) by mouth daily.     dicyclomine 10 MG capsule  Commonly known as:  BENTYL  Take 10 mg by mouth daily.     diltiazem 360 MG 24 hr capsule  Commonly known as:  CARDIZEM CD  Take 1 capsule (360 mg total)  by mouth daily.     esomeprazole 40 MG capsule  Commonly known as:  NEXIUM  Take 1 capsule (40 mg total) by mouth daily at 12 noon.     febuxostat 40 MG tablet  Commonly known as:  ULORIC  Take 40 mg by mouth daily.     hydrALAZINE 25 MG tablet  Commonly known as:  APRESOLINE  Take 1 tablet (25 mg total) by mouth 3 (three) times daily.     insulin glargine 100 UNIT/ML injection  Commonly known as:  LANTUS  Inject 40 Units into the skin daily as needed.     insulin lispro 100 UNIT/ML injection  Commonly known as:  HUMALOG  Inject 0.2 mLs (20 Units total) into the skin 3 (three) times daily after meals.     levofloxacin 500 MG tablet  Commonly  known as:  LEVAQUIN  Take 1 tablet (500 mg total) by mouth daily.     Linaclotide 145 MCG Caps capsule  Commonly known as:  LINZESS  Take 290 mcg by mouth daily.     metoprolol tartrate 25 MG tablet  Commonly known as:  LOPRESSOR  Take 3 tablets (75 mg total) by mouth 2 (two) times daily.     nitroGLYCERIN 0.4 MG SL tablet  Commonly known as:  NITROSTAT  Place 0.4 mg under the tongue every 5 (five) minutes as needed for chest pain.     potassium chloride SA 20 MEQ tablet  Commonly known as:  K-DUR,KLOR-CON  Take 1 tablet (20 mEq total) by mouth 2 (two) times daily.     ranitidine 150 MG tablet  Commonly known as:  ZANTAC  Take 1 tablet (150 mg total) by mouth at bedtime.     torsemide 20 MG tablet  Commonly known as:  DEMADEX  Take 1-2 tablets (20-40 mg total) by mouth 2 (two) times daily. Take 40 mg a.m., 20 mg p.m.     Vitamin D (Ergocalciferol) 50000 UNITS Caps capsule  Commonly known as:  DRISDOL  Take 50,000 Units by mouth every Monday.     XOPENEX HFA 45 MCG/ACT inhaler  Generic drug:  levalbuterol  Inhale 2 puffs into the lungs every 4 (four) hours as needed for wheezing or shortness of breath.     zolpidem 10 MG tablet  Commonly known as:  AMBIEN  Take 10 mg by mouth at bedtime.        Discharge  Instructions   (HEART FAILURE PATIENTS) Call MD:  Anytime you have any of the following symptoms: 1) 3 pound weight gain in 24 hours or 5 pounds in 1 week 2) shortness of breath, with or without a dry hacking cough 3) swelling in the hands, feet or stomach 4) if you have to sleep on extra pillows at night in order to breathe.    Complete by:  As directed      Diet - low sodium heart healthy    Complete by:  As directed      Diet Carb Modified    Complete by:  As directed      Increase activity slowly    Complete by:  As directed           Follow-up Information   Follow up with [facility]. (Registered Nurse and [facility] to resume services following hospital discharge back to home. )    Specialty:  [facility] information:   [address] [place] [address] [phone]       Follow up with Laverda Page, MD. (As Scheduled.)    Specialty:  Cardiology   Contact information:   [address] [place] [address] [phone]       Follow up with Delfin Edis, MD On 06/16/2014. (Keep appointment)    Specialty:  Gastroenterology   Contact information:   [place] [place] [address] [phone]       Follow up with [facility]. (The office will call)    Contact information:   [place] [address] [phone]      BRING ALL MEDICATIONS WITH YOU TO FOLLOW UP APPOINTMENTS  Time spent with patient to include physician time: 39 min Signed: Rosaria Ferries, PA-C 06/12/2014, 1:47 PM Co-Sign MD  lopressor 75 mg twice a  day and diltiazem 360 mg daily for rate control. On apixaban.  Increase home torsemide to 40 in am and 20 in pm. Will need HF f/u next week.  WIll need to resume [facility] once discharged. Had [facility] prior to admit.   Patient seen and examined with Rosaria Ferries, PA-C. We discussed all aspects of the encounter. I agree with the assessment and plan  as stated above.   Doing well. Stable for d/c. RHC numbers look great. Will follow in HF Clinic and with EP to consider AF ablation.  Daniel Bensimhon,MD 1:53 PM

## 2014-06-12 NOTE — CV Procedure (Signed)
Cardiac Cath Procedure Note:  Indication:  Dyspnea/HF  Procedures performed:  1) Right heart catheterization  Description of procedure:   The risks and indication of the procedure were explained. Consent was signed and placed on the chart. An appropriate timeout was taken prior to the procedure. The right neck was prepped and draped in the routine sterile fashion and anesthetized with 1% local lidocaine.   A 7 FR venous sheath was placed in the right internal jugular vein using a modified Seldinger technique. A standard Swan-Ganz catheter was used for the procedure.   Complications: None apparent.  Findings:  RA = 7 RV = 34/0/7 PA =  36/16 (28) PCW = 14 Fick cardiac output/index = 5.6/2.7 PVR = 2.5 WU FA sat = 98% PA sat = 68%, 71%  Assessment: 1. Well compensated filling pressures with minimal pulmonary HTN  Plan/Discussion:  Numbers look good. Given ongoing sx suspect she does not tolerate AF well. Will d/w Allred.   [name] MD 9:40 AM

## 2014-06-12 NOTE — Interval H&P Note (Signed)
History and Physical Interval Note:  06/12/2014 9:40 AM  [name]  has presented today for surgery, with the diagnosis of shortness of breath/hf  The various methods of treatment have been discussed with the patient and family. After consideration of risks, benefits and other options for treatment, the patient has consented to  Procedure(s): RIGHT HEART CATH (N/A) as a surgical intervention .  The patient's history has been reviewed, patient examined, no change in status, stable for surgery.  I have reviewed the patient's chart and labs.  Questions were answered to the patient's satisfaction.     Daniel Bensimhon

## 2014-06-12 NOTE — Progress Notes (Signed)
Advanced Heart Failure Rounding Note   Subjective:    [name] is a 68 yo female with a history of HTN, HLD, bronchial asthma on home 3L O2, DM2, CKD stage IV, atrial fibrillation (s/p aflutter ablation in 2008), tachy-brady syndrome and diastolic HF.   Admitted in 7/15 due to aspiration PNA after elective DC-CV with Dr. Einar Gip. While in hospital reverted back to AF and rate control strategy pursued. However, had trouble tolerating AF so was kept on amio with plans for eventual DC-CV   Evaluated in the HF clinic on 05/25/14 with reports of being extremely dizzy, fatigued and increased SOB with ambulation. Found to have junctional rhythm with rates 27-32 bpm. She initially converted to NSR but then went back to AF with RVR. EP was consulted to assess for PPM for tachy-brady syndrome. Dr. Rayann Heman saw the patient and felt she failed amio and suggested another trial of rate control. She was discharged on lopressor 75 bid and diltiazem 180 daily. Demadex cut to 20 daily. D/Cweight 226 pounds.   Admitted with increased dyspnea and tachycardia. Placed on IV diltiazem and later transitioned to po cardizem. Diuresed with IV lasix in the ED but had ongoing dyspnea so she was admitted.  CEs negative. Pro BNP up slightly.   Weight stable overnight. Feels better but still with some dyspnea.   RHC today with normal filling pressures and output. On abx for possible PNA. CXR without infiltrate.     Objective:   Weight Range:  Vital Signs:   Temp:  [97.9 F (36.6 C)-98.4 F (36.9 C)] 98 F (36.7 C) (10/09 0449) Pulse Rate:  [40-102] 102 (10/09 1030) Resp:  [14-18] 15 (10/09 1030) BP: (116-174)/(73-124) 164/122 mmHg (10/09 1036) SpO2:  [97 %-100 %] 98 % (10/09 1030) Weight:  [101.696 kg (224 lb 3.2 oz)] 101.696 kg (224 lb 3.2 oz) (10/09 0449) Last BM Date: 06/11/14  Weight change: Filed Weights   06/10/14 0435 06/11/14 0552 06/12/14 0449  Weight: 102.604 kg (226 lb 3.2 oz) 102.014 kg (224 lb 14.4  oz) 101.696 kg (224 lb 3.2 oz)    Intake/Output:   Intake/Output Summary (Last 24 hours) at 06/12/14 1253 Last data filed at 06/12/14 1000  Gross per 24 hour  Intake    650 ml  Output   1450 ml  Net   -800 ml     Physical Exam:  General: Obese, NAD, sitting on the sitting in chairl   HEENT: normal  Neck: supple. JVP 7; Carotids 2+ bilat; no bruits. No lymphadenopathy or thryomegaly appreciate  Cor: PMI nonpalpable. Decreased in the bases.  Irregular. No obvious murmur  Lungs: Diminished in the bases  Abdomen: Obese soft, nontender. Non-distended. No hepatosplenomegaly. No bruits or masses. Good bowel sounds.  Extremities: no cyanosis, clubbing, rash, warm. No edema.   Neuro: alert & orientedx3, cranial nerves grossly intact. moves all 4 extremities w/o difficulty. Affect  Telemetry: AF 80s-100    Labs: Basic Metabolic Panel:  Recent Labs Lab 06/09/14 1105 06/10/14 0730 06/11/14 1300 06/12/14 0441  NA 140 142 141 145  K 4.4 3.7 3.9 3.7  CL 105 103 102 103  CO2 21 29 24 27   GLUCOSE 101* 151* 220* 233*  BUN 17 18 19 20   CREATININE 2.23* 2.14* 2.22* 2.45*  CALCIUM 9.4 9.1 9.4 8.9    Liver Function Tests: No results found for this basename: AST, ALT, ALKPHOS, BILITOT, PROT, ALBUMIN,  in the last 168 hours No results found for this basename: LIPASE, AMYLASE,  in the last 168 hours No results found for this basename: AMMONIA,  in the last 168 hours  CBC:  Recent Labs Lab 06/09/14 1105 06/11/14 1300 06/12/14 0441  WBC 8.5 6.7 5.6  HGB 12.8 12.6 12.4  HCT 39.9 38.9 38.9  MCV 91.5 91.1 93.1  PLT 268 303 265    Cardiac Enzymes:  Recent Labs Lab 06/09/14 1843 06/10/14 0010 06/10/14 0730  TROPONINI <0.30 <0.30 <0.30    BNP: BNP (last 3 results)  Recent Labs  03/03/14 1851 05/25/14 1808 06/09/14 1105  PROBNP 1531.0* 2123.0* 2594.0*     Other results:  EKG:   Imaging: Dg Chest 2 View  06/11/2014   CLINICAL DATA:  Acute  on chronic dyspnea   EXAM: CHEST  2 VIEW  COMPARISON:  06/09/2014  FINDINGS: Cardiomediastinal silhouette is stable. Persistent residual mild interstitial prominence bilaterally without convincing pulmonary edema. No segmental infiltrate. Mild degenerative changes thoracic spine.  IMPRESSION: Persistent residual mild interstitial prominence bilaterally without convincing pulmonary edema. No segmental infiltrate.   Electronically Signed   By: Lahoma Crocker M.D.   On: 06/11/2014 15:16     Medications:     Scheduled Medications: . amitriptyline  25 mg Oral QHS  . apixaban  5 mg Oral BID  . atorvastatin  40 mg Oral q1800  . dicyclomine  10 mg Oral Daily  . diltiazem  360 mg Oral Daily  . famotidine  20 mg Oral QHS  . febuxostat  40 mg Oral Daily  . furosemide  80 mg Intravenous BID  . hydrALAZINE  25 mg Oral TID  . insulin aspart  0-20 Units Subcutaneous TID WC  . insulin glargine  20 Units Subcutaneous Daily  . Linaclotide  290 mcg Oral Daily  . metoprolol succinate  50 mg Oral BID  . pantoprazole  80 mg Oral Q1200  . piperacillin-tazobactam (ZOSYN)  IV  3.375 g Intravenous 3 times per day  . potassium chloride SA  20 mEq Oral BID  . sodium chloride  3 mL Intravenous Q12H  . vancomycin  1,500 mg Intravenous Q24H  . zolpidem  5 mg Oral QHS    Infusions:    PRN Medications: sodium chloride, acetaminophen, levalbuterol, nitroGLYCERIN, ondansetron (ZOFRAN) IV, sodium chloride   Assessment:  1. A/C respiratory hypoxemic failure  2. A/C diastolic HF  3. Chronic AF with RVR  4. Tachy-brady syndrome  5. CKD, stage IV 6. DM hgb A1C 7.4    Plan/Discussion:    RHC today looks good. Volume status optimized. Can go home today. If dyspnea persists will need to consider AF ablation but given comorbidities success rate in 50-60% range. Will continue with rate control strategy for now (has failed amio)  Continue lopressor 75 mg twice a day and diltiazem 360 mg daily for rate control.  On apixaban.   Increase  home torsemide to 40 in am and 20 in pm. Will need HF f/u next week.   WIll need to resume [facility] once discharged. Had [facility] prior to admit.  Would treat with 5 more days of levaquin 500 daily.  Length of Stay: 3   [name] MD 06/12/2014, 12:53 PM  Advanced Heart Failure Team Pager [phone] (M-F; 7a - 4p)  Please contact [facility] for night-coverage after hours (4p -7a ) and weekends on [email or web address]

## 2014-06-12 NOTE — Progress Notes (Signed)
PT Cancellation Note  Patient Details Name: [name] MRN: [id number] DOB: Jun 09, 1946   Cancelled Treatment:    Reason Eval/Treat Not Completed: Patient at procedure or test/unavailable. Pt currently being prepped by nursing to go to cath lab today.   Willow Ora 06/12/2014, 8:38 AM Willow Ora, PTA Office- [phone]

## 2014-06-12 NOTE — Progress Notes (Signed)
Site area: right Internal Jugular a 7 french sheath was removed  Site Prior to Removal:  Level 0  Pressure Applied For 15 MINUTES    Minutes Beginning at 1000am  Manual:   Yes.    Patient Status During Pull:  stable  Post Pull Groin Site:  Level 0  Post Pull Instructions Given:  Yes.    Post Pull Pulses Present:  Yes.    Dressing Applied:  Yes.    Comments:  Pt remain stable during sheath pull.  Pt denies any discomfort at site at this time.

## 2014-06-12 NOTE — Progress Notes (Signed)
Dr Haroldine Laws called and gave order to treat elevated BP.  Meds given as ordered.  Pt transported to  [facility] via bed

## 2014-06-12 NOTE — Progress Notes (Addendum)
Inpatient Diabetes Program Recommendations  AACE/ADA: New Consensus Statement on Inpatient Glycemic Control (2013)  Target Ranges:  Prepandial:   less than 140 mg/dL      Peak postprandial:   less than 180 mg/dL (1-2 hours)      Critically ill patients:  140 - 180 mg/dL  Results for MONQUIE, SCHMEISER (MRN [id number]) as of 06/12/2014 15:09  Ref. Range 06/11/2014 21:11 06/12/2014 06:27 06/12/2014 10:18 06/12/2014 11:16  Glucose-Capillary Latest Range: 70-99 mg/dL 204 (H) 229 (H) 217 (H) 206 (H)   Consider increasing Lantus to 25 units.   ADDENDUM: being discharged home.  Thank you  Raoul Pitch BSN, RN,CDE Inpatient Diabetes Coordinator [phone] (team pager)

## 2014-06-13 NOTE — Discharge Summary (Signed)
Patient seen and examined with [name], NP. We discussed all aspects of the encounter. I agree with the assessment and plan as stated above.   Da Michelle,MD 1:54 PM

## 2014-06-16 ENCOUNTER — Encounter (HOSPITAL_COMMUNITY): Payer: Self-pay | Admitting: Emergency Medicine

## 2014-06-16 ENCOUNTER — Telehealth (HOSPITAL_COMMUNITY): Payer: Self-pay | Admitting: *Deleted

## 2014-06-16 ENCOUNTER — Emergency Department (HOSPITAL_COMMUNITY): Payer: Medicare HMO

## 2014-06-16 ENCOUNTER — Ambulatory Visit: Payer: Commercial Managed Care - HMO | Admitting: Internal Medicine

## 2014-06-16 ENCOUNTER — Inpatient Hospital Stay (HOSPITAL_COMMUNITY)
Admission: EM | Admit: 2014-06-16 | Discharge: 2014-06-20 | DRG: 243 | Disposition: A | Discharge status: Home Health | Payer: Medicare HMO | Attending: Internal Medicine | Admitting: Internal Medicine

## 2014-06-16 DIAGNOSIS — I159 Secondary hypertension, unspecified: Secondary | ICD-10-CM

## 2014-06-16 DIAGNOSIS — G4733 Obstructive sleep apnea (adult) (pediatric): Secondary | ICD-10-CM | POA: Diagnosis present

## 2014-06-16 DIAGNOSIS — E860 Dehydration: Secondary | ICD-10-CM | POA: Diagnosis present

## 2014-06-16 DIAGNOSIS — I480 Paroxysmal atrial fibrillation: Secondary | ICD-10-CM

## 2014-06-16 DIAGNOSIS — I5032 Chronic diastolic (congestive) heart failure: Secondary | ICD-10-CM | POA: Diagnosis present

## 2014-06-16 DIAGNOSIS — N179 Acute kidney failure, unspecified: Secondary | ICD-10-CM | POA: Diagnosis present

## 2014-06-16 DIAGNOSIS — E785 Hyperlipidemia, unspecified: Secondary | ICD-10-CM | POA: Diagnosis present

## 2014-06-16 DIAGNOSIS — Z7901 Long term (current) use of anticoagulants: Secondary | ICD-10-CM | POA: Diagnosis not present

## 2014-06-16 DIAGNOSIS — I482 Chronic atrial fibrillation: Secondary | ICD-10-CM | POA: Diagnosis present

## 2014-06-16 DIAGNOSIS — I4819 Other persistent atrial fibrillation: Secondary | ICD-10-CM

## 2014-06-16 DIAGNOSIS — J45909 Unspecified asthma, uncomplicated: Secondary | ICD-10-CM | POA: Diagnosis present

## 2014-06-16 DIAGNOSIS — K219 Gastro-esophageal reflux disease without esophagitis: Secondary | ICD-10-CM | POA: Diagnosis present

## 2014-06-16 DIAGNOSIS — Z79899 Other long term (current) drug therapy: Secondary | ICD-10-CM | POA: Diagnosis not present

## 2014-06-16 DIAGNOSIS — Z87891 Personal history of nicotine dependence: Secondary | ICD-10-CM

## 2014-06-16 DIAGNOSIS — Z96651 Presence of right artificial knee joint: Secondary | ICD-10-CM | POA: Diagnosis present

## 2014-06-16 DIAGNOSIS — N185 Chronic kidney disease, stage 5: Secondary | ICD-10-CM | POA: Diagnosis present

## 2014-06-16 DIAGNOSIS — I2510 Atherosclerotic heart disease of native coronary artery without angina pectoris: Secondary | ICD-10-CM | POA: Diagnosis present

## 2014-06-16 DIAGNOSIS — Z794 Long term (current) use of insulin: Secondary | ICD-10-CM

## 2014-06-16 DIAGNOSIS — J9621 Acute and chronic respiratory failure with hypoxia: Secondary | ICD-10-CM

## 2014-06-16 DIAGNOSIS — E1129 Type 2 diabetes mellitus with other diabetic kidney complication: Secondary | ICD-10-CM | POA: Diagnosis present

## 2014-06-16 DIAGNOSIS — Z955 Presence of coronary angioplasty implant and graft: Secondary | ICD-10-CM | POA: Diagnosis not present

## 2014-06-16 DIAGNOSIS — R7989 Other specified abnormal findings of blood chemistry: Secondary | ICD-10-CM | POA: Diagnosis present

## 2014-06-16 DIAGNOSIS — I4891 Unspecified atrial fibrillation: Secondary | ICD-10-CM

## 2014-06-16 DIAGNOSIS — I495 Sick sinus syndrome: Principal | ICD-10-CM

## 2014-06-16 DIAGNOSIS — I10 Essential (primary) hypertension: Secondary | ICD-10-CM | POA: Diagnosis present

## 2014-06-16 DIAGNOSIS — N184 Chronic kidney disease, stage 4 (severe): Secondary | ICD-10-CM

## 2014-06-16 DIAGNOSIS — E119 Type 2 diabetes mellitus without complications: Secondary | ICD-10-CM | POA: Diagnosis present

## 2014-06-16 DIAGNOSIS — I129 Hypertensive chronic kidney disease with stage 1 through stage 4 chronic kidney disease, or unspecified chronic kidney disease: Secondary | ICD-10-CM | POA: Diagnosis present

## 2014-06-16 DIAGNOSIS — E1122 Type 2 diabetes mellitus with diabetic chronic kidney disease: Secondary | ICD-10-CM

## 2014-06-16 DIAGNOSIS — R001 Bradycardia, unspecified: Secondary | ICD-10-CM | POA: Diagnosis present

## 2014-06-16 DIAGNOSIS — I252 Old myocardial infarction: Secondary | ICD-10-CM

## 2014-06-16 DIAGNOSIS — J9611 Chronic respiratory failure with hypoxia: Secondary | ICD-10-CM | POA: Diagnosis present

## 2014-06-16 DIAGNOSIS — R197 Diarrhea, unspecified: Secondary | ICD-10-CM | POA: Diagnosis present

## 2014-06-16 DIAGNOSIS — K760 Fatty (change of) liver, not elsewhere classified: Secondary | ICD-10-CM | POA: Diagnosis present

## 2014-06-16 DIAGNOSIS — I739 Peripheral vascular disease, unspecified: Secondary | ICD-10-CM | POA: Diagnosis present

## 2014-06-16 DIAGNOSIS — Z9981 Dependence on supplemental oxygen: Secondary | ICD-10-CM | POA: Diagnosis not present

## 2014-06-16 LAB — COMPREHENSIVE METABOLIC PANEL
ALT: 24 U/L (ref 0–35)
AST: 24 U/L (ref 0–37)
Albumin: 3.4 g/dL — ABNORMAL LOW (ref 3.5–5.2)
Alkaline Phosphatase: 94 U/L (ref 39–117)
Anion gap: 15 (ref 5–15)
BUN: 30 mg/dL — ABNORMAL HIGH (ref 6–23)
CO2: 22 mEq/L (ref 19–32)
Calcium: 9.6 mg/dL (ref 8.4–10.5)
Chloride: 105 mEq/L (ref 96–112)
Creatinine, Ser: 2.86 mg/dL — ABNORMAL HIGH (ref 0.50–1.10)
GFR calc Af Amer: 18 mL/min — ABNORMAL LOW (ref >90)
GFR calc non Af Amer: 16 mL/min — ABNORMAL LOW (ref >90)
Glucose, Bld: 195 mg/dL — ABNORMAL HIGH (ref 70–99)
Potassium: 4 mEq/L (ref 3.7–5.3)
Sodium: 142 mEq/L (ref 137–147)
Total Bilirubin: 0.3 mg/dL (ref 0.3–1.2)
Total Protein: 7.9 g/dL (ref 6.0–8.3)

## 2014-06-16 LAB — CBC WITH DIFFERENTIAL/PLATELET
Basophils Absolute: 0 10*3/uL (ref 0.0–0.1)
Basophils Relative: 0 % (ref 0–1)
Eosinophils Absolute: 0.3 10*3/uL (ref 0.0–0.7)
Eosinophils Relative: 4 % (ref 0–5)
HCT: 36.3 % (ref 36.0–46.0)
Hemoglobin: 12 g/dL (ref 12.0–15.0)
Lymphocytes Relative: 22 % (ref 12–46)
Lymphs Abs: 1.7 10*3/uL (ref 0.7–4.0)
MCH: 29.7 pg (ref 26.0–34.0)
MCHC: 33.1 g/dL (ref 30.0–36.0)
MCV: 89.9 fL (ref 78.0–100.0)
Monocytes Absolute: 1 10*3/uL (ref 0.1–1.0)
Monocytes Relative: 14 % — ABNORMAL HIGH (ref 3–12)
Neutro Abs: 4.5 10*3/uL (ref 1.7–7.7)
Neutrophils Relative %: 60 % (ref 43–77)
Platelets: 262 10*3/uL (ref 150–400)
RBC: 4.04 MIL/uL (ref 3.87–5.11)
RDW: 16.8 % — ABNORMAL HIGH (ref 11.5–15.5)
WBC: 7.5 10*3/uL (ref 4.0–10.5)

## 2014-06-16 LAB — MAGNESIUM: Magnesium: 2.1 mg/dL (ref 1.5–2.5)

## 2014-06-16 LAB — I-STAT TROPONIN, ED: Troponin i, poc: 0 ng/mL (ref 0.00–0.08)

## 2014-06-16 LAB — I-STAT CG4 LACTIC ACID, ED: Lactic Acid, Venous: 1.71 mmol/L (ref 0.5–2.2)

## 2014-06-16 LAB — GLUCOSE, CAPILLARY: Glucose-Capillary: 308 mg/dL — ABNORMAL HIGH (ref 70–99)

## 2014-06-16 LAB — CLOSTRIDIUM DIFFICILE BY PCR: Toxigenic C. Difficile by PCR: NEGATIVE

## 2014-06-16 LAB — TROPONIN I: Troponin I: <0.3 ng/mL (ref <0.30)

## 2014-06-16 LAB — TSH: TSH: 6.3 u[IU]/mL — ABNORMAL HIGH (ref 0.350–4.500)

## 2014-06-16 MED ORDER — LEVALBUTEROL HCL 0.63 MG/3ML IN NEBU: 0.6300 mg | INHALATION_SOLUTION | RESPIRATORY_TRACT | Status: DC | PRN

## 2014-06-16 MED ORDER — DICYCLOMINE HCL 10 MG PO CAPS
10.0000 mg | ORAL_CAPSULE | Freq: Every day | ORAL | Status: DC
  Administered 2014-06-17 – 2014-06-20 (×3): 10 mg via ORAL
  Filled 2014-06-16 (×6): qty 1

## 2014-06-16 MED ORDER — METOPROLOL TARTRATE 25 MG PO TABS
25.0000 mg | ORAL_TABLET | Freq: Two times a day (BID) | ORAL | Status: DC
  Administered 2014-06-16 – 2014-06-20 (×7): 25 mg via ORAL
  Filled 2014-06-16 (×14): qty 1

## 2014-06-16 MED ORDER — LEVALBUTEROL TARTRATE 45 MCG/ACT IN AERO: 2.0000 | INHALATION_SPRAY | RESPIRATORY_TRACT | Status: DC | PRN

## 2014-06-16 MED ORDER — ACETAMINOPHEN 325 MG PO TABS
650.0000 mg | ORAL_TABLET | Freq: Four times a day (QID) | ORAL | Status: DC | PRN
  Administered 2014-06-17: 650 mg via ORAL
  Filled 2014-06-16: qty 2

## 2014-06-16 MED ORDER — TORSEMIDE 20 MG PO TABS
40.0000 mg | ORAL_TABLET | Freq: Every day | ORAL | Status: DC
Start: 2014-06-17 — End: 2014-06-20
  Administered 2014-06-17 – 2014-06-20 (×4): 40 mg via ORAL
  Filled 2014-06-16 (×7): qty 2

## 2014-06-16 MED ORDER — SODIUM CHLORIDE 0.9 % IJ SOLN: 3.0000 mL | Freq: Two times a day (BID) | INTRAMUSCULAR | Status: DC

## 2014-06-16 MED ORDER — NITROGLYCERIN 0.4 MG SL SUBL: 0.4000 mg | SUBLINGUAL_TABLET | SUBLINGUAL | Status: DC | PRN

## 2014-06-16 MED ORDER — VITAMIN D (ERGOCALCIFEROL) 1.25 MG (50000 UNIT) PO CAPS: 50000.0000 [IU] | ORAL_CAPSULE | ORAL | Status: DC

## 2014-06-16 MED ORDER — DILTIAZEM HCL ER COATED BEADS 180 MG PO CP24
180.0000 mg | ORAL_CAPSULE | Freq: Every day | ORAL | Status: DC
  Administered 2014-06-17: 180 mg via ORAL
  Filled 2014-06-16: qty 1

## 2014-06-16 MED ORDER — AMITRIPTYLINE HCL 25 MG PO TABS
25.0000 mg | ORAL_TABLET | Freq: Every day | ORAL | Status: DC
  Administered 2014-06-16 – 2014-06-19 (×4): 25 mg via ORAL
  Filled 2014-06-16 (×6): qty 1

## 2014-06-16 MED ORDER — DIPHENOXYLATE-ATROPINE 2.5-0.025 MG PO TABS: 2.0000 | ORAL_TABLET | Freq: Once | ORAL | Status: DC

## 2014-06-16 MED ORDER — ATORVASTATIN CALCIUM 40 MG PO TABS
40.0000 mg | ORAL_TABLET | Freq: Every day | ORAL | Status: DC
  Administered 2014-06-16 – 2014-06-20 (×4): 40 mg via ORAL
  Filled 2014-06-16 (×7): qty 1

## 2014-06-16 MED ORDER — INSULIN ASPART 100 UNIT/ML ~~LOC~~ SOLN
0.0000 [IU] | Freq: Three times a day (TID) | SUBCUTANEOUS | Status: DC
  Administered 2014-06-17: 2 [IU] via SUBCUTANEOUS
  Administered 2014-06-17: 5 [IU] via SUBCUTANEOUS

## 2014-06-16 MED ORDER — SODIUM CHLORIDE 0.9 % IV SOLN: 250.0000 mL | INTRAVENOUS | Status: DC | PRN

## 2014-06-16 MED ORDER — ACETAMINOPHEN 650 MG RE SUPP: 650.0000 mg | Freq: Four times a day (QID) | RECTAL | Status: DC | PRN

## 2014-06-16 MED ORDER — HYDRALAZINE HCL 20 MG/ML IJ SOLN: 5.0000 mg | INTRAMUSCULAR | Status: DC | PRN

## 2014-06-16 MED ORDER — ZOLPIDEM TARTRATE 5 MG PO TABS
5.0000 mg | ORAL_TABLET | Freq: Every evening | ORAL | Status: DC | PRN
  Administered 2014-06-16 – 2014-06-19 (×4): 5 mg via ORAL
  Filled 2014-06-16 (×4): qty 1

## 2014-06-16 MED ORDER — FEBUXOSTAT 40 MG PO TABS
40.0000 mg | ORAL_TABLET | Freq: Every day | ORAL | Status: DC
  Administered 2014-06-17 – 2014-06-20 (×3): 40 mg via ORAL
  Filled 2014-06-16 (×4): qty 1

## 2014-06-16 MED ORDER — ZOLPIDEM TARTRATE 5 MG PO TABS: 5.0000 mg | ORAL_TABLET | Freq: Every evening | ORAL | Status: DC | PRN

## 2014-06-16 MED ORDER — HYDRALAZINE HCL 25 MG PO TABS
25.0000 mg | ORAL_TABLET | Freq: Three times a day (TID) | ORAL | Status: DC
  Administered 2014-06-16 – 2014-06-20 (×11): 25 mg via ORAL
  Filled 2014-06-16 (×21): qty 1

## 2014-06-16 MED ORDER — FAMOTIDINE 10 MG PO TABS
10.0000 mg | ORAL_TABLET | Freq: Two times a day (BID) | ORAL | Status: DC
  Administered 2014-06-16 – 2014-06-20 (×6): 10 mg via ORAL
  Filled 2014-06-16 (×10): qty 1

## 2014-06-16 MED ORDER — ACETAMINOPHEN 500 MG PO TABS: 500.0000 mg | ORAL_TABLET | Freq: Four times a day (QID) | ORAL | Status: DC | PRN

## 2014-06-16 MED ORDER — LINACLOTIDE 290 MCG PO CAPS
290.0000 ug | ORAL_CAPSULE | Freq: Every day | ORAL | Status: DC
  Administered 2014-06-17 – 2014-06-20 (×3): 290 ug via ORAL
  Filled 2014-06-16 (×4): qty 1

## 2014-06-16 MED ORDER — ONDANSETRON HCL 4 MG PO TABS: 4.0000 mg | ORAL_TABLET | Freq: Four times a day (QID) | ORAL | Status: DC | PRN

## 2014-06-16 MED ORDER — SODIUM CHLORIDE 0.9 % IJ SOLN
3.0000 mL | Freq: Two times a day (BID) | INTRAMUSCULAR | Status: DC
  Administered 2014-06-16 – 2014-06-17 (×3): 3 mL via INTRAVENOUS

## 2014-06-16 MED ORDER — ZOLPIDEM TARTRATE 5 MG PO TABS: 10.0000 mg | ORAL_TABLET | Freq: Every day | ORAL | Status: DC

## 2014-06-16 MED ORDER — POTASSIUM CHLORIDE CRYS ER 20 MEQ PO TBCR
20.0000 meq | EXTENDED_RELEASE_TABLET | Freq: Two times a day (BID) | ORAL | Status: DC
  Administered 2014-06-16 – 2014-06-17 (×2): 20 meq via ORAL
  Filled 2014-06-16 (×3): qty 1

## 2014-06-16 MED ORDER — SODIUM CHLORIDE 0.9 % IJ SOLN: 3.0000 mL | INTRAMUSCULAR | Status: DC | PRN

## 2014-06-16 MED ORDER — ONDANSETRON HCL 4 MG/2ML IJ SOLN: 4.0000 mg | Freq: Four times a day (QID) | INTRAMUSCULAR | Status: DC | PRN

## 2014-06-16 MED ORDER — APIXABAN 5 MG PO TABS
5.0000 mg | ORAL_TABLET | Freq: Two times a day (BID) | ORAL | Status: DC
  Administered 2014-06-16 – 2014-06-17 (×3): 5 mg via ORAL
  Filled 2014-06-16 (×6): qty 1

## 2014-06-16 MED ORDER — INSULIN GLARGINE 100 UNIT/ML ~~LOC~~ SOLN
20.0000 [IU] | Freq: Every day | SUBCUTANEOUS | Status: DC
  Administered 2014-06-16 – 2014-06-19 (×3): 20 [IU] via SUBCUTANEOUS
  Filled 2014-06-16 (×5): qty 0.2

## 2014-06-16 MED ORDER — TORSEMIDE 20 MG PO TABS
20.0000 mg | ORAL_TABLET | Freq: Every day | ORAL | Status: DC
  Administered 2014-06-16 – 2014-06-19 (×4): 20 mg via ORAL
  Filled 2014-06-16 (×8): qty 1

## 2014-06-16 NOTE — ED Notes (Signed)
MD at bedside. 

## 2014-06-16 NOTE — ED Provider Notes (Addendum)
CSN: [id number]     Arrival date & time 06/16/14  1525 History   First MD Initiated Contact with Patient 06/16/14 1530     Chief Complaint  Patient presents with  . Diarrhea  . Chest Pain  . Shortness of Breath  . Nausea     HPI  Patient presents with the complaint diarrhea for the last 4 days.  Recent discharge from the hospital on Levaquin after an episode of acute on chronic respiratory failure. Normal chest x-ray in the hospital. States she has dyspnea but this is at her baseline. Denies shortness of breath. Mild generalized weakness. Currently her home health care nurse noted bradycardia. She called her cardiologist office, they were referred here. Patient presents via EMS. Is on 3 L nasal cannula at home. Wears O2 and CPAP at night.  His admission with a sudden rapid ventricular response and decompensated heart failure. History of CHF and usually does not tolerate being in atrial fibrillation.  States her diarrhea is present "every time I eat or drink". States it is more than 5 times per day. No noted blood.  Past Medical History  Diagnosis Date  . Atrial flutter     ablated by Dr Lovena Le in 2008  . Hypertension   . Diabetes mellitus   . Diastolic heart failure     a. EF 60-65%, RV nl (03/2014)  . Asthma   . Hyperlipidemia   . Fatty liver   . Esophageal dysmotility   . Arthritis   . Sleep apnea     wears CPAP  . Fatty tumor fatty tumor back  . Coronary atherosclerosis of native coronary artery   . Morbid obesity   . Myocardial infarction 2009  . Heart murmur   . Peripheral vascular disease   . GERD (gastroesophageal reflux disease)     barrets esophagus  . Anginal pain     occ; non-ischemic Lexiscan 09/2012  . Kidney disease     CKD stage IV (Dr. Erling Cruz)  . Complication of anesthesia     " DIFFICULTY BREATHING "  . Persistent atrial fibrillation   . Tachycardia-bradycardia   . [name] esophagus   . Hiatal hernia   . Esophageal dysmotilities    Past  Surgical History  Procedure Laterality Date  . Coronary angioplasty with stent placement    . Breast lumpectomy      right  . Tubal ligation    . Tonsillectomy    . Total knee arthroplasty Right 12/15/2013    Procedure: RIGHT TOTAL KNEE ARTHROPLASTY;  Surgeon: Alta Corning, MD;  Location: [place];  Service: Orthopedics;  Laterality: Right;  . Cardioversion N/A 03/03/2014    Procedure: CARDIOVERSION;  Surgeon: Laverda Page, MD;  Location: [facility] ENDOSCOPY;  Service: Cardiovascular;  Laterality: N/A;  . Atrial flutter ablation  2008    CTI ablation by Dr Lovena Le   Family History  Problem Relation Age of Onset  . Heart disease Mother   . Cancer Mother     bladder  . Kidney disease Mother   . Ovarian cancer Daughter   . Stomach cancer Maternal Uncle   . Colon cancer Maternal Aunt   . Esophageal cancer Neg Hx    History  Substance Use Topics  . Smoking status: Former Research scientist (life sciences)  . Smokeless tobacco: Never Used     Comment: 05/2014  QUIT OVER 20 YEARS AGO "  . Alcohol Use: No   OB History   Grav Para Term Preterm Abortions TAB SAB  Ect Mult Living                 Review of Systems  Constitutional: Negative for fever, chills, diaphoresis, appetite change and fatigue.  HENT: Negative for mouth sores, sore throat and trouble swallowing.   Eyes: Negative for visual disturbance.  Respiratory: Negative for cough, chest tightness, shortness of breath and wheezing.        Short of breath with almost any exertion, states this is at her baseline. Two-pillow orthopnea, again at her baseline.  Cardiovascular: Negative for chest pain.  Gastrointestinal: Positive for diarrhea. Negative for nausea, vomiting, abdominal pain and abdominal distention.  Endocrine: Negative for polydipsia, polyphagia and polyuria.  Genitourinary: Negative for dysuria, frequency and hematuria.  Musculoskeletal: Negative for gait problem.  Skin: Negative for color change, pallor and rash.  Neurological: Negative for  dizziness, syncope, light-headedness and headaches.  Hematological: Does not bruise/bleed easily.  Psychiatric/Behavioral: Negative for behavioral problems and confusion.      Allergies  Adhesive; Sulfa antibiotics; Codeine; and Penicillins  Home Medications   Prior to Admission medications   Medication Sig Start Date End Date Taking? Authorizing Provider  acetaminophen (TYLENOL) 500 MG tablet Take 500 mg by mouth every 6 (six) hours as needed for mild pain.   Yes Historical Provider, MD  amitriptyline (ELAVIL) 25 MG tablet Take 25 mg by mouth at bedtime.     Yes Historical Provider, MD  apixaban (ELIQUIS) 5 MG TABS tablet Take 1 tablet (5 mg total) by mouth 2 (two) times daily. 04/29/14  Yes Jolaine Artist, MD  atorvastatin (LIPITOR) 40 MG tablet Take 1 tablet (40 mg total) by mouth daily. 04/29/14  Yes Jolaine Artist, MD  dicyclomine (BENTYL) 10 MG capsule Take 10 mg by mouth daily.    Yes Historical Provider, MD  diltiazem (CARDIZEM CD) 360 MG 24 hr capsule Take 1 capsule (360 mg total) by mouth daily. 06/12/14  Yes Rhonda G [name], PA-C  esomeprazole (NEXIUM) 40 MG capsule Take 1 capsule (40 mg total) by mouth daily at 12 noon. 04/29/14  Yes Jolaine Artist, MD  febuxostat (ULORIC) 40 MG tablet Take 40 mg by mouth daily.    Yes Historical Provider, MD  hydrALAZINE (APRESOLINE) 25 MG tablet Take 1 tablet (25 mg total) by mouth 3 (three) times daily. 06/03/14  Yes Rande Brunt, NP  insulin glargine (LANTUS) 100 UNIT/ML injection Inject 40 Units into the skin daily as needed. 12/23/13  Yes Delfina Redwood, MD  insulin lispro (HUMALOG) 100 UNIT/ML injection Inject 0.2 mLs (20 Units total) into the skin 3 (three) times daily after meals. 12/23/13  Yes Delfina Redwood, MD  levofloxacin (LEVAQUIN) 500 MG tablet Take 1 tablet (500 mg total) by mouth daily. 06/12/14  Yes Rhonda G [name], PA-C  Linaclotide (LINZESS) 145 MCG CAPS capsule Take 290 mcg by mouth daily.   Yes Historical  Provider, MD  metoprolol tartrate (LOPRESSOR) 25 MG tablet Take 3 tablets (75 mg total) by mouth 2 (two) times daily. 06/03/14  Yes Rande Brunt, NP  potassium chloride SA (K-DUR,KLOR-CON) 20 MEQ tablet Take 1 tablet (20 mEq total) by mouth 2 (two) times daily. 04/02/14  Yes Jolaine Artist, MD  ranitidine (ZANTAC) 150 MG tablet Take 1 tablet (150 mg total) by mouth at bedtime. 04/29/14  Yes Jolaine Artist, MD  torsemide (DEMADEX) 20 MG tablet Take 1-2 tablets (20-40 mg total) by mouth 2 (two) times daily. Take 40 mg a.m., 20 mg p.m.  06/12/14  Yes Rhonda G [name], PA-C  Vitamin D, Ergocalciferol, (DRISDOL) 50000 UNITS CAPS capsule Take 50,000 Units by mouth every Monday.   Yes Historical Provider, MD  XOPENEX HFA 45 MCG/ACT inhaler Inhale 2 puffs into the lungs every 4 (four) hours as needed for wheezing or shortness of breath. 03/11/14  Yes Marijean Heath, NP  zolpidem (AMBIEN) 10 MG tablet Take 10 mg by mouth at bedtime.    Yes Historical Provider, MD  nitroGLYCERIN (NITROSTAT) 0.4 MG SL tablet Place 0.4 mg under the tongue every 5 (five) minutes as needed for chest pain.     Historical Provider, MD   BP 155/79  Pulse 64  Temp(Src) 97.8 F (36.6 C) (Oral)  Resp 13  Ht 5\' 4"  (1.626 m)  Wt 225 lb (102.059 kg)  BMI 38.60 kg/m2  SpO2 97% Physical Exam  Constitutional: She is oriented to person, place, and time. She appears well-developed and well-nourished. No distress.  HENT:  Head: Normocephalic.  Eyes: Conjunctivae are normal. Pupils are equal, round, and reactive to light. No scleral icterus.  Neck: Normal range of motion. Neck supple. No thyromegaly present.  Cardiovascular: Regular rhythm.  Bradycardia present.  Exam reveals no gallop and no friction rub.   No murmur heard. Bradycardic, rate in the low 40s. Blood pressure 149/75.. Junctional rhythm on the monitor noted.  Pulmonary/Chest: Effort normal and breath sounds normal. No respiratory distress. She has no wheezes. She  has no rales.  Bilateral breath sounds. No crackles or rales. No gallop. No JVD. No dependent edema.  Abdominal: Soft. Bowel sounds are normal. She exhibits no distension. There is no tenderness. There is no rebound.  Soft benign abdomen.  Musculoskeletal: Normal range of motion.  Neurological: She is alert and oriented to person, place, and time.  Skin: Skin is warm and dry. No rash noted.  Psychiatric: She has a normal mood and affect. Her behavior is normal.    ED Course  Procedures (including critical care time) Labs Review Labs Reviewed  CBC WITH DIFFERENTIAL - Abnormal; Notable for the following:    RDW 16.8 (*)    Monocytes Relative 14 (*)    All other components within normal limits  COMPREHENSIVE METABOLIC PANEL - Abnormal; Notable for the following:    Glucose, Bld 195 (*)    BUN 30 (*)    Creatinine, Ser 2.86 (*)    Albumin 3.4 (*)    GFR calc non Af Amer 16 (*)    GFR calc Af Amer 18 (*)    All other components within normal limits  CLOSTRIDIUM DIFFICILE BY PCR  MAGNESIUM  I-STAT TROPOININ, ED  I-STAT CG4 LACTIC ACID, ED    Imaging Review Dg Chest 2 View  06/16/2014   CLINICAL DATA:  Midsternal chest pain with difficulty breathing  EXAM: CHEST  2 VIEW  COMPARISON:  June 11, 2014  FINDINGS: There is no edema or consolidation. Heart is upper normal in size with pulmonary vascularity within normal limits. No adenopathy. No bone lesions.  IMPRESSION: No edema or consolidation.   Electronically Signed   By: Lowella Grip M.D.   On: 06/16/2014 16:26     Initial EKG:  Junctional Bradycardia, Rate 42.   EKG Interpretation   Date/Time:  Tuesday June 16 2014 17:24:31 EDT Ventricular Rate:  60 PR Interval:  216 QRS Duration: 98 QT Interval:  454 QTC Calculation: 454 R Axis:   43 Text Interpretation:  Sinus rhythm Confirmed by Jeneen Rinks  MD, [place] ([id number])  on  06/16/2014 6:11:40 PM      MDM   Final diagnoses:  Diarrhea  Tachycardia-bradycardia syndrome     Problem list includes:  Diarrhea.  Potentially,  C. difficile with a recent admission on antibiotics. Also bradycardic. Has had episodes of junctional bradycardia before. Is on Cardizem and Lopressor. Will check her electrolytes. The patient clinically is euvolemic.    Tanna Furry, MD 06/16/14 1816  Tanna Furry, MD 06/16/14 [id number]  Tanna Furry, MD 06/16/14 [phone]

## 2014-06-16 NOTE — ED Notes (Signed)
Pt presents to department for evaluation of midsternal chest pain, SOB, nausea and diarrhea. Bradycardia noted on monitor upon arrival, denies CP at the time. Respirations unlabored. Pt is alert and oriented x4. Wears 3L O2 at home.

## 2014-06-16 NOTE — ED Notes (Signed)
Pt ambulated to restroom.  Steady gait.  Pt denies dizziness

## 2014-06-16 NOTE — Consult Note (Signed)
Advanced Heart Failure Team Consult Note  Reason for Consultation: Bradycardia  HPI:    [name] is a 68 y.o. female with a history of HTN, HLD, chronic respiratory failure  on home 3L O2, DM2, CKD stage IV, atrial fibrillation (s/p aflutter ablation in 2008), tachy-brady syndrome and diastolic HF.   Admitted in 7/15 due to aspiration PNA after elective DC-CV with Dr. Einar Gip. While in hospital reverted back to AF and rate control strategy pursued. However, had trouble tolerating AF so was kept on amio with plans for eventual DC-CV   Admitted 05/25/14 with junctional rhythm with rates 27-32 bpm. She initially converted to NSR but then went back to AF with RVR. EP was consulted to assess for PPM for tachy-brady syndrome. Dr. Rayann Heman saw the patient and felt she failed amio and suggested another trial of rate control. She was discharged on lopressor 75 bid and diltiazem 180 daily. Demadex cut to 20 daily. D/Cweight 226 pounds.   Admitted again last week with increased dyspnea and AF with RVR. Placed on IV diltiazem and later transitioned to po cardizem. Diuresed with IV lasix. Started on abx for questionable PNA and discharged on levaquin. Had persistent dyspnea so underwent RHC on  06/12/14 which showed well compensated filling pressures.   RA = 7  RV = 34/0/7  PA = 36/16 (28)  PCW = 14  Fick cardiac output/index = 5.6/2.7  PVR = 2.5 WU  FA sat = 98%  PA sat = 68%, 71%   Has had 2 days of persistent diarrhea. Evaluated [facility] who found patient to be bradycardic. Brought to ER. Has recurrent junctional rhythm on ECG with HR 40s. Denies dyspnea, orthopnea, PND or weight gain. Cr 2.4->2.9   Review of Systems: [y] = yes, [ ]  = no   General: Weight gain [ ] ; Weight loss Blue.Reese ]; Anorexia [ ] ; Fatigue [ y]; Fever [ ] ; Chills [ ] ; Weakness [ y]  Cardiac: Chest pain/pressure [ ] ; Resting SOB [ ] ; Exertional SOB Blue.Reese ]; Orthopnea [ ] ; Pedal Edema [ ] ; Palpitations [ ] ; Syncope [ ] ; Presyncope [ ] ;  Paroxysmal nocturnal dyspnea[ ]   Pulmonary: Cough [ ] ; Wheezing[ ] ; Hemoptysis[ ] ; Sputum [ ] ; Snoring [ ]   GI: Vomiting[ ] ; Dysphagia[ ] ; Melena[ ] ; Hematochezia [ ] ; Heartburn[ ] ; Abdominal pain [ ] ; Constipation [ ] ; Diarrhea [ ] ; BRBPR [ ]   GU: Hematuria[ ] ; Dysuria [ ] ; Nocturia[ ]   Vascular: Pain in legs with walking [ ] ; Pain in feet with lying flat [ ] ; Non-healing sores [ ] ; Stroke [ ] ; TIA [ ] ; Slurred speech [ ] ;  Neuro: Headaches[ ] ; Vertigo[ ] ; Seizures[ ] ; Paresthesias[ ] ;Blurred vision [ ] ; Diplopia [ ] ; Vision changes [ ]   Ortho/Skin: Arthritis [ y]; Joint pain Blue.Reese ]; Muscle pain [ ] ; Joint swelling [ ] ; Back Pain [ ] ; Rash [ ]   Psych: Depression[ ] ; Anxiety[ ]   Heme: Bleeding problems [ ] ; Clotting disorders [ ] ; Anemia [ ]   Endocrine: Diabetes Blue.Reese ]; Thyroid dysfunction[ ]   Home Medications Prior to Admission medications   Medication Sig Start Date End Date Taking? Authorizing Provider  acetaminophen (TYLENOL) 500 MG tablet Take 500 mg by mouth every 6 (six) hours as needed for mild pain.   Yes Historical Provider, MD  amitriptyline (ELAVIL) 25 MG tablet Take 25 mg by mouth at bedtime.     Yes Historical Provider, MD  apixaban (ELIQUIS) 5 MG TABS tablet Take 1 tablet (5 mg total) by  mouth 2 (two) times daily. 04/29/14  Yes Jolaine Artist, MD  atorvastatin (LIPITOR) 40 MG tablet Take 1 tablet (40 mg total) by mouth daily. 04/29/14  Yes Jolaine Artist, MD  dicyclomine (BENTYL) 10 MG capsule Take 10 mg by mouth daily.    Yes Historical Provider, MD  diltiazem (CARDIZEM CD) 360 MG 24 hr capsule Take 1 capsule (360 mg total) by mouth daily. 06/12/14  Yes Rhonda G [name], PA-C  esomeprazole (NEXIUM) 40 MG capsule Take 1 capsule (40 mg total) by mouth daily at 12 noon. 04/29/14  Yes Jolaine Artist, MD  febuxostat (ULORIC) 40 MG tablet Take 40 mg by mouth daily.    Yes Historical Provider, MD  hydrALAZINE (APRESOLINE) 25 MG tablet Take 1 tablet (25 mg total) by mouth 3 (three)  times daily. 06/03/14  Yes Rande Brunt, NP  insulin glargine (LANTUS) 100 UNIT/ML injection Inject 40 Units into the skin daily as needed. 12/23/13  Yes Delfina Redwood, MD  insulin lispro (HUMALOG) 100 UNIT/ML injection Inject 0.2 mLs (20 Units total) into the skin 3 (three) times daily after meals. 12/23/13  Yes Delfina Redwood, MD  levofloxacin (LEVAQUIN) 500 MG tablet Take 1 tablet (500 mg total) by mouth daily. 06/12/14  Yes Rhonda G [name], PA-C  Linaclotide (LINZESS) 145 MCG CAPS capsule Take 290 mcg by mouth daily.   Yes Historical Provider, MD  metoprolol tartrate (LOPRESSOR) 25 MG tablet Take 3 tablets (75 mg total) by mouth 2 (two) times daily. 06/03/14  Yes Rande Brunt, NP  potassium chloride SA (K-DUR,KLOR-CON) 20 MEQ tablet Take 1 tablet (20 mEq total) by mouth 2 (two) times daily. 04/02/14  Yes Jolaine Artist, MD  ranitidine (ZANTAC) 150 MG tablet Take 1 tablet (150 mg total) by mouth at bedtime. 04/29/14  Yes Jolaine Artist, MD  torsemide (DEMADEX) 20 MG tablet Take 1-2 tablets (20-40 mg total) by mouth 2 (two) times daily. Take 40 mg a.m., 20 mg p.m. 06/12/14  Yes Rhonda G [name], PA-C  Vitamin D, Ergocalciferol, (DRISDOL) 50000 UNITS CAPS capsule Take 50,000 Units by mouth every Monday.   Yes Historical Provider, MD  XOPENEX HFA 45 MCG/ACT inhaler Inhale 2 puffs into the lungs every 4 (four) hours as needed for wheezing or shortness of breath. 03/11/14  Yes Marijean Heath, NP  zolpidem (AMBIEN) 10 MG tablet Take 10 mg by mouth at bedtime.    Yes Historical Provider, MD  nitroGLYCERIN (NITROSTAT) 0.4 MG SL tablet Place 0.4 mg under the tongue every 5 (five) minutes as needed for chest pain.     Historical Provider, MD    Past Medical History: Past Medical History  Diagnosis Date  . Atrial flutter     ablated by Dr Lovena Le in 2008  . Hypertension   . Diabetes mellitus   . Diastolic heart failure     a. EF 60-65%, RV nl (03/2014)  . Asthma   . Hyperlipidemia    . Fatty liver   . Esophageal dysmotility   . Arthritis   . Sleep apnea     wears CPAP  . Fatty tumor fatty tumor back  . Coronary atherosclerosis of native coronary artery   . Morbid obesity   . Myocardial infarction 2009  . Heart murmur   . Peripheral vascular disease   . GERD (gastroesophageal reflux disease)     barrets esophagus  . Anginal pain     occ; non-ischemic Lexiscan 09/2012  . Kidney disease  CKD stage IV (Dr. Erling Cruz)  . Complication of anesthesia     " DIFFICULTY BREATHING "  . Persistent atrial fibrillation   . Tachycardia-bradycardia   . [name] esophagus   . Hiatal hernia   . Esophageal dysmotilities     Past Surgical History: Past Surgical History  Procedure Laterality Date  . Coronary angioplasty with stent placement    . Breast lumpectomy      right  . Tubal ligation    . Tonsillectomy    . Total knee arthroplasty Right 12/15/2013    Procedure: RIGHT TOTAL KNEE ARTHROPLASTY;  Surgeon: Alta Corning, MD;  Location: [place];  Service: Orthopedics;  Laterality: Right;  . Cardioversion N/A 03/03/2014    Procedure: CARDIOVERSION;  Surgeon: Laverda Page, MD;  Location: [facility] ENDOSCOPY;  Service: Cardiovascular;  Laterality: N/A;  . Atrial flutter ablation  2008    CTI ablation by Dr Lovena Le    Family History: Family History  Problem Relation Age of Onset  . Heart disease Mother   . Cancer Mother     bladder  . Kidney disease Mother   . Ovarian cancer Daughter   . Stomach cancer Maternal Uncle   . Colon cancer Maternal Aunt   . Esophageal cancer Neg Hx     Social History: History   Social History  . Marital Status: Widowed    Spouse Name: N/A    Number of Children: 3  . Years of Education: N/A   Social History Main Topics  . Smoking status: Former Research scientist (life sciences)  . Smokeless tobacco: Never Used     Comment: 05/2014  QUIT OVER 20 YEARS AGO "  . Alcohol Use: No  . Drug Use: No  . Sexual Activity: Not Currently   Other Topics Concern  .  None   Social History Narrative  . None    Allergies:  Allergies  Allergen Reactions  . Adhesive [Tape] Itching    EKG leads  . Sulfa Antibiotics Itching and Nausea And Vomiting    "everything I seen was red"  . Codeine Nausea And Vomiting  . Penicillins Nausea And Vomiting    Objective:    Vital Signs:   Temp:  [97.8 F (36.6 C)] 97.8 F (36.6 C) (10/13 1539) Pulse Rate:  [40-59] 58 (10/13 1700) Resp:  [13-19] 19 (10/13 1700) BP: (132-149)/(67-79) 141/77 mmHg (10/13 1700) SpO2:  [93 %-100 %] 100 % (10/13 1700) Weight:  [102.059 kg (225 lb)] 102.059 kg (225 lb) (10/13 1539)    Weight change: Filed Weights   06/16/14 1539  Weight: 102.059 kg (225 lb)    Intake/Output:  No intake or output data in the 24 hours ending 06/16/14 1715   Physical Exam: General: Obese, NAD HEENT: normal  Neck: supple. JVP flat; Carotids 2+ bilat; no bruits. No lymphadenopathy or thryomegaly appreciate  Cor: PMI nonpalpable. Regular bradycardic no murmur Lungs: Diminished in the bases  Abdomen: Obese soft, nontender. Non-distended. No hepatosplenomegaly. No bruits or masses. Good bowel sounds.  Extremities: no cyanosis, clubbing, rash, warm. No edema.  Neuro: alert & orientedx3, cranial nerves grossly intact. moves all 4 extremities w/o difficulty. Affect  pleasant   Telemetry: Junctional rhythm 40s  Labs: Basic Metabolic Panel:  Recent Labs Lab 06/10/14 0730 06/11/14 1300 06/12/14 0441 06/16/14 1546  NA 142 141 145 142  K 3.7 3.9 3.7 4.0  CL 103 102 103 105  CO2 29 24 27 22   GLUCOSE 151* 220* 233* 195*  BUN 18 19 20  30*  CREATININE 2.14* 2.22* 2.45* 2.86*  CALCIUM 9.1 9.4 8.9 9.6  MG  --   --   --  2.1    Liver Function Tests:  Recent Labs Lab 06/16/14 1546  AST 24  ALT 24  ALKPHOS 94  BILITOT 0.3  PROT 7.9  ALBUMIN 3.4*   No results found for this basename: LIPASE, AMYLASE,  in the last 168 hours No results found for this basename: AMMONIA,  in the last  168 hours  CBC:  Recent Labs Lab 06/11/14 1300 06/12/14 0441 06/16/14 1546  WBC 6.7 5.6 7.5  NEUTROABS  --   --  4.5  HGB 12.6 12.4 12.0  HCT 38.9 38.9 36.3  MCV 91.1 93.1 89.9  PLT 303 265 262    Cardiac Enzymes:  Recent Labs Lab 06/09/14 1843 06/10/14 0010 06/10/14 0730  TROPONINI <0.30 <0.30 <0.30    BNP: BNP (last 3 results)  Recent Labs  03/03/14 1851 05/25/14 1808 06/09/14 1105  PROBNP 1531.0* 2123.0* 2594.0*    CBG:  Recent Labs Lab 06/11/14 2111 06/12/14 0627 06/12/14 1018 06/12/14 1116 06/12/14 1612  GLUCAP 204* 229* 217* 206* 266*    Coagulation Studies: No results found for this basename: LABPROT, INR,  in the last 72 hours  Other results: EKG: junctional rhythm 40.   Imaging: Dg Chest 2 View  06/16/2014   CLINICAL DATA:  Midsternal chest pain with difficulty breathing  EXAM: CHEST  2 VIEW  COMPARISON:  June 11, 2014  FINDINGS: There is no edema or consolidation. Heart is upper normal in size with pulmonary vascularity within normal limits. No adenopathy. No bone lesions.  IMPRESSION: No edema or consolidation.   Electronically Signed   By: Lowella Grip M.D.   On: 06/16/2014 16:26      Assessment:   1. Diarrhea   --C. Diff vs abx associated 2. Recurrent junction bradycardia 3. Tachy-brady syndrome 4. A/c renal failure, stage IV 5. Chronic diastolic HF 6. Chronic atrial fibrillation   Plan/Discussion:    Main issue currently is her diarrhea but she also has a recurrent junctional bradycardia which may or may not be related to increased vagal tone from her diarrhea vs high dose AV nodal blockade.  She had this previously on amiodarone which has been stopped for two weeks. Her HF is stable and she looks a bit on the dry side.  At this point we will ask [facility] to admit and help [place] work up the diarrhea. Would place in SDU until junctional rhythm resolves. Hold all AV nodal blockers. Continue Xarelto. I will ask EP to see to at  least weigh in on the possibility of a permanent pacemaker.   Debra Tullis,MD 5:21 PM  Length of Stay: 0  Advanced Heart Failure Team Pager [phone] (M-F; 7a - 4p)  Please contact [facility] for night-coverage after hours (4p -7a ) and weekends on [email or web address]

## 2014-06-16 NOTE — ED Notes (Signed)
Dr. Enid Derry at bedside

## 2014-06-16 NOTE — H&P (Signed)
[facility]                                                                                    Patient Demographics  [name], is a 68 y.o. female  CSN: [id number]  MRN: [id number]  DOB - Feb 19, 1946  Admit Date - 06/16/2014  Outpatient Primary MD for the patient is [name], MD   With History of -  Past Medical History  Diagnosis Date  . Atrial flutter     ablated by Dr Lovena Le in 2008  . Hypertension   . Diabetes mellitus   . Diastolic heart failure     a. EF 60-65%, RV nl (03/2014)  . Asthma   . Hyperlipidemia   . Fatty liver   . Esophageal dysmotility   . Arthritis   . Sleep apnea     wears CPAP  . Fatty tumor fatty tumor back  . Coronary atherosclerosis of native coronary artery   . Morbid obesity   . Myocardial infarction 2009  . Heart murmur   . Peripheral vascular disease   . GERD (gastroesophageal reflux disease)     barrets esophagus  . Anginal pain     occ; non-ischemic Lexiscan 09/2012  . Kidney disease     CKD stage IV (Dr. Erling Cruz)  . Complication of anesthesia     " DIFFICULTY BREATHING "  . Persistent atrial fibrillation   . Tachycardia-bradycardia   . [name] esophagus   . Hiatal hernia   . Esophageal dysmotilities       Past Surgical History  Procedure Laterality Date  . Coronary angioplasty with stent placement    . Breast lumpectomy      right  . Tubal ligation    . Tonsillectomy    . Total knee arthroplasty Right 12/15/2013    Procedure: RIGHT TOTAL KNEE ARTHROPLASTY;  Surgeon: Alta Corning, MD;  Location: [place];  Service: Orthopedics;  Laterality: Right;  . Cardioversion N/A 03/03/2014    Procedure: CARDIOVERSION;  Surgeon: Laverda Page, MD;  Location: [place];  Service: Cardiovascular;  Laterality: N/A;  . Atrial flutter ablation  2008    CTI ablation by Dr Lovena Le    in for   Chief Complaint  Patient presents with  . Diarrhea  . Chest Pain  . Shortness of Breath  . Nausea      HPI  [name]  is a 68 y.o. female, atrial flutter with tachybradycardia syndrome and history of diastolic congestive heart failure, who failed amiodarone for rate control due to severe bradycardia ,was admitted for questionable pneumonia last week and was discharged home on Levaquin . The patient had episodes of diarrhea associated with nausea but no vomiting, since discharge which were nonbloody 4-5 per day with no documented fever or abdominal pain. Patient denies any history of chest pains or palpitations however reports some indigestion. No urinary symptoms. In the emergency room she had junctional rhythm in the 40s, however this reverted to normal sinus rhythm.    Review of Systems    In addition to the HPI above,  No Fever-chills, No Headache, No changes with Vision or hearing, No problems swallowing  food or Liquids, No Chest pain, Cough or Shortness of Breath, No Blood in stool or Urine, No dysuria, No new skin rashes or bruises, No new joints pains-aches,  No new weakness, tingling, numbness in any extremity, No recent weight gain or loss, No polyuria, polydypsia or polyphagia, No significant Mental Stressors.  A full 10 point Review of Systems was done, except as stated above, all other Review of Systems were negative.   Social History History  Substance Use Topics  . Smoking status: Former Research scientist (life sciences)  . Smokeless tobacco: Never Used     Comment: 05/2014  QUIT OVER 20 YEARS AGO "  . Alcohol Use: No     Family History Family History  Problem Relation Age of Onset  . Heart disease Mother   . Cancer Mother     bladder  . Kidney disease Mother   . Ovarian cancer Daughter   . Stomach cancer Maternal Uncle   . Colon cancer Maternal Aunt   . Esophageal cancer Neg Hx      Prior to Admission medications   Medication Sig Start Date End Date Taking? Authorizing Provider  acetaminophen (TYLENOL) 500 MG tablet Take 500 mg by mouth every 6 (six) hours as needed for  mild pain.   Yes Historical Provider, MD  amitriptyline (ELAVIL) 25 MG tablet Take 25 mg by mouth at bedtime.     Yes Historical Provider, MD  apixaban (ELIQUIS) 5 MG TABS tablet Take 1 tablet (5 mg total) by mouth 2 (two) times daily. 04/29/14  Yes Jolaine Artist, MD  atorvastatin (LIPITOR) 40 MG tablet Take 1 tablet (40 mg total) by mouth daily. 04/29/14  Yes Jolaine Artist, MD  dicyclomine (BENTYL) 10 MG capsule Take 10 mg by mouth daily.    Yes Historical Provider, MD  diltiazem (CARDIZEM CD) 360 MG 24 hr capsule Take 1 capsule (360 mg total) by mouth daily. 06/12/14  Yes Rhonda G [name], PA-C  esomeprazole (NEXIUM) 40 MG capsule Take 1 capsule (40 mg total) by mouth daily at 12 noon. 04/29/14  Yes Jolaine Artist, MD  febuxostat (ULORIC) 40 MG tablet Take 40 mg by mouth daily.    Yes Historical Provider, MD  hydrALAZINE (APRESOLINE) 25 MG tablet Take 1 tablet (25 mg total) by mouth 3 (three) times daily. 06/03/14  Yes Rande Brunt, NP  insulin glargine (LANTUS) 100 UNIT/ML injection Inject 40 Units into the skin daily as needed. 12/23/13  Yes Delfina Redwood, MD  insulin lispro (HUMALOG) 100 UNIT/ML injection Inject 0.2 mLs (20 Units total) into the skin 3 (three) times daily after meals. 12/23/13  Yes Delfina Redwood, MD  levofloxacin (LEVAQUIN) 500 MG tablet Take 1 tablet (500 mg total) by mouth daily. 06/12/14  Yes Rhonda G [name], PA-C  Linaclotide (LINZESS) 145 MCG CAPS capsule Take 290 mcg by mouth daily.   Yes Historical Provider, MD  metoprolol tartrate (LOPRESSOR) 25 MG tablet Take 3 tablets (75 mg total) by mouth 2 (two) times daily. 06/03/14  Yes Rande Brunt, NP  potassium chloride SA (K-DUR,KLOR-CON) 20 MEQ tablet Take 1 tablet (20 mEq total) by mouth 2 (two) times daily. 04/02/14  Yes Jolaine Artist, MD  ranitidine (ZANTAC) 150 MG tablet Take 1 tablet (150 mg total) by mouth at bedtime. 04/29/14  Yes Jolaine Artist, MD  torsemide (DEMADEX) 20 MG tablet Take 1-2  tablets (20-40 mg total) by mouth 2 (two) times daily. Take 40 mg a.m., 20 mg p.m. 06/12/14  Yes Evelene Croon [name], PA-C  Vitamin D, Ergocalciferol, (DRISDOL) 50000 UNITS CAPS capsule Take 50,000 Units by mouth every Monday.   Yes Historical Provider, MD  XOPENEX HFA 45 MCG/ACT inhaler Inhale 2 puffs into the lungs every 4 (four) hours as needed for wheezing or shortness of breath. 03/11/14  Yes Marijean Heath, NP  zolpidem (AMBIEN) 10 MG tablet Take 10 mg by mouth at bedtime.    Yes Historical Provider, MD  nitroGLYCERIN (NITROSTAT) 0.4 MG SL tablet Place 0.4 mg under the tongue every 5 (five) minutes as needed for chest pain.     Historical Provider, MD    Allergies  Allergen Reactions  . Adhesive [Tape] Itching    EKG leads  . Sulfa Antibiotics Itching and Nausea And Vomiting    "everything I seen was red"  . Codeine Nausea And Vomiting  . Penicillins Nausea And Vomiting    Physical Exam  Vitals  Blood pressure 155/79, pulse 64, temperature 97.8 F (36.6 C), temperature source Oral, resp. rate 13, height 5\' 4"  (1.626 m), weight 102.059 kg (225 lb), SpO2 97.00%.   1. General elderly African American female looks chronically ill  2. Normal affect and insight, Not Suicidal or Homicidal, Awake Alert, Oriented X 3.  3. No F.N deficits, ALL C.Nerves Intact, .  4. Ears and Eyes appear Normal, Conjunctivae clear, PERRLA. Moist Oral Mucosa.  5. Supple Neck, No JVD, No cervical lymphadenopathy appriciated, No Carotid Bruits.  6. Symmetrical Chest wall movement,  Decreased breath sounds in the bases.  7. irregular beat, No Gallops, Rubs or Murmurs, No Parasternal Heave.  8. Positive Bowel Sounds, Abdomen mild generalized tenderness, No organomegaly appriciated,No rebound -guarding or rigidity.  9.  No Cyanosis, Normal Skin Turgor, No Skin Rash or Bruise.  10. Good muscle tone,  joints appear normal , no edema noted.  11. No Palpable Lymph Nodes in Neck or Axillae    Data  Review  CBC  Recent Labs Lab 06/11/14 1300 06/12/14 0441 06/16/14 1546  WBC 6.7 5.6 7.5  HGB 12.6 12.4 12.0  HCT 38.9 38.9 36.3  PLT 303 265 262  MCV 91.1 93.1 89.9  MCH 29.5 29.7 29.7  MCHC 32.4 31.9 33.1  RDW 16.8* 16.7* 16.8*  LYMPHSABS  --   --  1.7  MONOABS  --   --  1.0  EOSABS  --   --  0.3  BASOSABS  --   --  0.0   ------------------------------------------------------------------------------------------------------------------  Chemistries   Recent Labs Lab 06/10/14 0730 06/11/14 1300 06/12/14 0441 06/16/14 1546  NA 142 141 145 142  K 3.7 3.9 3.7 4.0  CL 103 102 103 105  CO2 29 24 27 22   GLUCOSE 151* 220* 233* 195*  BUN 18 19 20  30*  CREATININE 2.14* 2.22* 2.45* 2.86*  CALCIUM 9.1 9.4 8.9 9.6  MG  --   --   --  2.1  AST  --   --   --  24  ALT  --   --   --  24  ALKPHOS  --   --   --  94  BILITOT  --   --   --  0.3   ------------------------------------------------------------------------------------------------------------------ estimated creatinine clearance is 21.9 ml/min (by C-G formula based on Cr of 2.86). ------------------------------------------------------------------------------------------------------------------ No results found for this basename: TSH, T4TOTAL, FREET3, T3FREE, THYROIDAB,  in the last 72 hours   Coagulation profile No results found for this basename: INR, PROTIME,  in the last 168 hours -------------------------------------------------------------------------------------------------------------------  No results found for this basename: DDIMER,  in the last 72 hours -------------------------------------------------------------------------------------------------------------------  Cardiac Enzymes  Recent Labs Lab 06/10/14 0010 06/10/14 0730  TROPONINI <0.30 <0.30   ------------------------------------------------------------------------------------------------------------------ No components found with this  basename: POCBNP,    Imaging results:   Dg Chest 2 View  06/16/2014   CLINICAL DATA:  Midsternal chest pain with difficulty breathing  EXAM: CHEST  2 VIEW  COMPARISON:  June 11, 2014  FINDINGS: There is no edema or consolidation. Heart is upper normal in size with pulmonary vascularity within normal limits. No adenopathy. No bone lesions.  IMPRESSION: No edema or consolidation.   Electronically Signed   By: Lowella Grip M.D.   On: 06/16/2014 16:26   Dg Chest 2 View  06/11/2014   CLINICAL DATA:  Acute  on chronic dyspnea  EXAM: CHEST  2 VIEW  COMPARISON:  06/09/2014  FINDINGS: Cardiomediastinal silhouette is stable. Persistent residual mild interstitial prominence bilaterally without convincing pulmonary edema. No segmental infiltrate. Mild degenerative changes thoracic spine.  IMPRESSION: Persistent residual mild interstitial prominence bilaterally without convincing pulmonary edema. No segmental infiltrate.   Electronically Signed   By: Lahoma Crocker M.D.   On: 06/11/2014 15:16   Dg Chest Portable 1 View  06/09/2014   CLINICAL DATA:  Midchest pain, onset 1 day ago. Shortness of breath.  EXAM: PORTABLE CHEST - 1 VIEW  COMPARISON:  04/02/2014.  FINDINGS: Mediastinum and hilar structures normal. Cardiomegaly of mild pulmonary venous congestion. Bilateral pulmonary alveolar infiltrates noted. These changes may be related congestive heart failure with pulmonary edema. Pneumonia cannot be excluded. No pleural effusion or pneumothorax. No acute bony abnormality.  IMPRESSION: 1. Findings suggesting congestive heart failure with mild pulmonary edema.  2. Pneumonia cannot be excluded. Similar infiltrates are noted on prior study 04/02/2014. These infiltrates could be recurrent or persistent. Close follow-up chest x-rays to demonstrate clearing suggested.   Electronically Signed   By: Marcello Moores  Register   On: 06/09/2014 12:26    My personal review of EKG: First strip shows junctional rhythm at 40 and the  second strip shows normal sinus rhythm at 60    Assessment & Plan  1. 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 Zoila Shutter     I decreased her Cardizem to 180 and Lopressor to 25 twice a day due to history of RVR    Will be evaluated by EP in a.m.     Monitor her heart rate and EKG     Serial troponins  2. Diarrhea with abdominal tenderness, no fever but nausea     Check stools for C. Difficile     Check stool culture and lactoferrin  3. Hypertension     Continue hydralazine, Cardizem and Lopressor     Monitor closely and when necessary hydralazine IV  4. 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 Stage IV chronic kidney disease with clinical dehydration     We'll decrease diuretics in half     Check BMP in a.m.          DVT Prophylaxis on full dose anticoagulation  AM Labs Ordered, also please review Full Orders  Family Communication: Admission, patients condition and plan of care including tests being ordered have been discussed with the patient and daughter who indicate understanding and agree with the plan and Code Status.  Code Status full  Disposition Plan: Home  Time spent in minutes : 33 minutes  Condition GUARDED   @SIGNATURE @

## 2014-06-16 NOTE — ED Notes (Signed)
Patient transported to X-ray 

## 2014-06-16 NOTE — Telephone Encounter (Signed)
[name] called concerned about pt, she states her HR is 42 BP 138/60 pt is dizzy and has been having diarrhea for 4 days, she states everything she eats is coming right back out, wt stable, breathing usual for pt, discussed all above with Dr Haroldine Laws, he would like for pt to come to ER for eval, Cindy aware and will let pt know

## 2014-06-16 NOTE — ED Notes (Signed)
all belongings sent with patient/some with family pt acknowledges.

## 2014-06-17 ENCOUNTER — Inpatient Hospital Stay (HOSPITAL_COMMUNITY): Admit: 2014-06-17 | Payer: Commercial Managed Care - HMO

## 2014-06-17 DIAGNOSIS — I5032 Chronic diastolic (congestive) heart failure: Secondary | ICD-10-CM

## 2014-06-17 DIAGNOSIS — I481 Persistent atrial fibrillation: Secondary | ICD-10-CM

## 2014-06-17 DIAGNOSIS — I495 Sick sinus syndrome: Principal | ICD-10-CM

## 2014-06-17 DIAGNOSIS — I159 Secondary hypertension, unspecified: Secondary | ICD-10-CM

## 2014-06-17 DIAGNOSIS — R7989 Other specified abnormal findings of blood chemistry: Secondary | ICD-10-CM | POA: Diagnosis present

## 2014-06-17 LAB — BASIC METABOLIC PANEL
Anion gap: 15 (ref 5–15)
BUN: 27 mg/dL — ABNORMAL HIGH (ref 6–23)
CO2: 25 mEq/L (ref 19–32)
Calcium: 9.4 mg/dL (ref 8.4–10.5)
Chloride: 106 mEq/L (ref 96–112)
Creatinine, Ser: 2.57 mg/dL — ABNORMAL HIGH (ref 0.50–1.10)
GFR calc Af Amer: 21 mL/min — ABNORMAL LOW (ref >90)
GFR calc non Af Amer: 18 mL/min — ABNORMAL LOW (ref >90)
Glucose, Bld: 149 mg/dL — ABNORMAL HIGH (ref 70–99)
Potassium: 3.5 mEq/L — ABNORMAL LOW (ref 3.7–5.3)
Sodium: 146 mEq/L (ref 137–147)

## 2014-06-17 LAB — GLUCOSE, CAPILLARY
Glucose-Capillary: 150 mg/dL — ABNORMAL HIGH (ref 70–99)
Glucose-Capillary: 248 mg/dL — ABNORMAL HIGH (ref 70–99)
Glucose-Capillary: 181 mg/dL — ABNORMAL HIGH (ref 70–99)
Glucose-Capillary: 155 mg/dL — ABNORMAL HIGH (ref 70–99)

## 2014-06-17 LAB — T4, FREE: Free T4: 1.16 ng/dL (ref 0.80–1.80)

## 2014-06-17 LAB — MRSA PCR SCREENING: MRSA by PCR: NEGATIVE

## 2014-06-17 LAB — TROPONIN I
Troponin I: <0.3 ng/mL (ref <0.30)
Troponin I: <0.3 ng/mL (ref <0.30)

## 2014-06-17 MED ORDER — POTASSIUM CHLORIDE CRYS ER 20 MEQ PO TBCR
20.0000 meq | EXTENDED_RELEASE_TABLET | Freq: Three times a day (TID) | ORAL | Status: DC
  Administered 2014-06-17 – 2014-06-20 (×8): 20 meq via ORAL
  Filled 2014-06-17 (×2): qty 1
  Filled 2014-06-17: qty 2
  Filled 2014-06-17 (×10): qty 1

## 2014-06-17 MED ORDER — INSULIN ASPART 100 UNIT/ML ~~LOC~~ SOLN
20.0000 [IU] | Freq: Three times a day (TID) | SUBCUTANEOUS | Status: DC
  Administered 2014-06-17 – 2014-06-20 (×9): 20 [IU] via SUBCUTANEOUS

## 2014-06-17 MED ORDER — HYDRALAZINE HCL 20 MG/ML IJ SOLN: 10.0000 mg | INTRAMUSCULAR | Status: DC | PRN

## 2014-06-17 MED ORDER — INSULIN ASPART 100 UNIT/ML ~~LOC~~ SOLN
0.0000 [IU] | Freq: Three times a day (TID) | SUBCUTANEOUS | Status: DC
  Administered 2014-06-17: 2 [IU] via SUBCUTANEOUS
  Administered 2014-06-18: 5 [IU] via SUBCUTANEOUS
  Administered 2014-06-18 – 2014-06-19 (×4): 2 [IU] via SUBCUTANEOUS
  Administered 2014-06-20: 1 [IU] via SUBCUTANEOUS
  Administered 2014-06-20: 2 [IU] via SUBCUTANEOUS
  Administered 2014-06-20: 3 [IU] via SUBCUTANEOUS

## 2014-06-17 MED ORDER — INSULIN ASPART 100 UNIT/ML ~~LOC~~ SOLN: 0.0000 [IU] | Freq: Every day | SUBCUTANEOUS | Status: DC

## 2014-06-17 NOTE — Progress Notes (Signed)
Pt transferred to 3W08 from 2 Central. Pt alert and oriented X4, able to follow commands and denies any pain at this time. VSS, pt OOB to chair, call bell within reach. Will continue to monitor closely. Report received from [facility].

## 2014-06-17 NOTE — Progress Notes (Signed)
[name]  [name] [id number] DOB: 1946-03-02 DOA: 06/16/2014 PCP: [name], MD   Brief narrative: 68 year old female patient with underlying atrial fibrillation as well as diabetes and stage IV chronic kidney disease who was hospitalized late September after experiencing symptomatic junctional rhythm while utilizing amiodarone, metoprolol and cardizem.  Medication adjustments were made and she improved to sinus rhythm then back into atrial fibrillation.  She returned to the hospital, was admitted for questionable pneumonia, and discharged home on Levaquin 10/9, as well as Lopressor 75 twice a day and diltiazem 360 daily.   On the date of this admission her home health nurse was concerned about ongoing diarrhea for 4 days and pulses in the 40s. The cardiology office was consulted and the patient was instructed to present to the emergency department.  In the ER she was noted to have recurrent junctional bradycardia rates in the 40s but apparently spontaneously back into sinus rhythm. Her AV nodal blocking agents were held by the admitting physician. In regards to her diarrhea she was not having any abdominal pain. The stools were nonbloody. During the previous admission was felt the patient may have had a pneumonia process so she was discharged home on Levaquin.  HPI/Subjective: Patient alert and sitting on side of the bed. No further diarrhea. States she is very hungry. No chest pain or shortness of breath.  Assessment/Plan:  Parox Atrial fibrillation / Junctional (nodal) bradycardia Appreciate cardiology assistance - currently in sinus bradycardia with rates greater than 55-60 - low-dose metoprolol had been resumed overnight as well as low dose calcium channel blocker - for now cardiology will continue these medications - EP has also been consulted in the event patient needs a pacemaker; they have recommended that insertion of pacemaker  will depend on how she tolerates her adjusted doses and a recurrence at either significant bradycardia or A. fib with RVR that would warrant higher rate control therapy which as she has proven she seems not be able to tolerate - continue eliquis  Abnormal TSH TSH 6.3 - check free T4  Diarrhea C. difficile PCR negative  DMtype 2 with renal complications CBGs labile between 150 and 308 - home insulin orders were reviewed: Has 2 Lantus orders 1 for 20 units daily at bedtime and another from 40 units into the skin daily as needed which does not make sense - also on Humalog insulin 20 units with meals - A1c last admit 7.4 - for now will continue 20 units of Lantus at bedtime and add back 20 units meal coverage with sliding scale coverage  Acute on chronic respiratory failure with hypoxia:   A) Obstructive sleep apnea   B) Chronic diastolic heart failure Currently well compensated - patient denies noncompliance with CPAP at home - continue home diuretics  GERD  Continue Pepcid  Chronic kidney disease stage IV (severe) Current renal function remains stable - baseline crt appears to be ~2.3-2.5  Hypertension Blood pressure well controlled-when necessary meds available   DVT prophylaxis: Eliquis Code Status: Full Family Communication: No family at bedside Disposition Plan/Expected LOS: Transfer to telemetry   Consultants: Cardiology Electrophysiology  Procedures: None  Cultures: None  Antibiotics: None  Objective: Blood pressure 128/66, pulse 57, temperature 98.1 F (36.7 C), temperature source Oral, resp. rate 16, height 5\' 4"  (1.626 m), weight 224 lb 10.4 oz (101.9 kg), SpO2 95.00%.  Intake/Output Summary (Last 24 hours) at 06/17/14 1408 Last data filed at 06/17/14 1330  Gross per 24 hour  Intake      0 ml  Output   3400 ml  Net  -3400 ml   Exam: Gen: No acute respiratory distress Chest: Clear to auscultation bilaterally without wheezes, rhonchi or crackles, 2  L Cardiac: Regular occasionally bradycardic rate and rhythm, S1-S2, no rubs murmurs or gallops, no peripheral edema, no JVD Abdomen: Soft nontender nondistended without obvious hepatosplenomegaly, no ascites Extremities: Symmetrical in appearance without cyanosis, clubbing or effusion  Scheduled Meds:  Scheduled Meds: . amitriptyline  25 mg Oral QHS  . apixaban  5 mg Oral BID  . atorvastatin  40 mg Oral Daily  . dicyclomine  10 mg Oral Daily  . famotidine  10 mg Oral BID  . febuxostat  40 mg Oral Daily  . hydrALAZINE  25 mg Oral TID  . insulin aspart  0-15 Units Subcutaneous TID WC  . insulin glargine  20 Units Subcutaneous QHS  . Linaclotide  290 mcg Oral Daily  . metoprolol tartrate  25 mg Oral BID  . potassium chloride SA  20 mEq Oral BID  . sodium chloride  3 mL Intravenous Q12H  . sodium chloride  3 mL Intravenous Q12H  . torsemide  20 mg Oral q1800  . torsemide  40 mg Oral Q breakfast  . [START ON 06/22/2014] Vitamin D (Ergocalciferol)  50,000 Units Oral Q Mon   Data Reviewed: Basic Metabolic Panel:  Recent Labs Lab 06/11/14 1300 06/12/14 0441 06/16/14 1546 06/17/14 0855  NA 141 145 142 146  K 3.9 3.7 4.0 3.5*  CL 102 103 105 106  CO2 24 27 22 25   GLUCOSE 220* 233* 195* 149*  BUN 19 20 30* 27*  CREATININE 2.22* 2.45* 2.86* 2.57*  CALCIUM 9.4 8.9 9.6 9.4  MG  --   --  2.1  --    Liver Function Tests:  Recent Labs Lab 06/16/14 1546  AST 24  ALT 24  ALKPHOS 94  BILITOT 0.3  PROT 7.9  ALBUMIN 3.4*   CBC:  Recent Labs Lab 06/11/14 1300 06/12/14 0441 06/16/14 1546  WBC 6.7 5.6 7.5  NEUTROABS  --   --  4.5  HGB 12.6 12.4 12.0  HCT 38.9 38.9 36.3  MCV 91.1 93.1 89.9  PLT 303 265 262   Cardiac Enzymes:  Recent Labs Lab 06/16/14 2117 06/17/14 0257 06/17/14 0855  TROPONINI <0.30 <0.30 <0.30   BNP (last 3 results)  Recent Labs  03/03/14 1851 05/25/14 1808 06/09/14 1105  PROBNP 1531.0* 2123.0* 2594.0*   CBG:  Recent Labs Lab  06/12/14 1116 06/12/14 1612 06/16/14 2212 06/17/14 0757 06/17/14 1155  GLUCAP 206* 266* 308* 150* 248*    Recent Results (from the past 240 hour(s))  CLOSTRIDIUM DIFFICILE BY PCR     Status: None   Collection Time    06/16/14  5:27 PM      Result Value Ref Range Status   C difficile by pcr NEGATIVE  NEGATIVE Final  MRSA PCR SCREENING     Status: None   Collection Time    06/16/14 10:44 PM      Result Value Ref Range Status   MRSA by PCR NEGATIVE  NEGATIVE Final   Comment:            The GeneXpert MRSA Assay (FDA     approved for NASAL specimens     only), is one component of a     comprehensive MRSA colonization     surveillance program. It is  not     intended to diagnose MRSA     infection nor to guide or     monitor treatment for     MRSA infections.     Studies:  Recent x-ray studies have been reviewed in detail by the Attending Physician  Time spent :  [place], ANP [facility] Office  [phone] Pager [phone]  On-Call/Text Page:      [email or web address]      password [email or web address]  If 7PM-7AM, please contact night-coverage [email or web address] Password [email or web address] 06/17/2014, 2:08 PM   LOS: 1 day   I have personally examined this patient and reviewed the entire database. I have reviewed the above note, made any necessary editorial changes, and agree with its content.  Cherene Altes, MD [facility]

## 2014-06-17 NOTE — Progress Notes (Signed)
CPAP machine set up at bedside with FFM, 5.0 cm H20 per patient home settings.  Patient awaiting QHS medication at this time. Patient will call when ready for CPAP.

## 2014-06-17 NOTE — Consult Note (Addendum)
Reason for Consult: Recurrent Junctional Bradycardia Referring Physician: Dr. Haroldine Laws Primary Cardiologist: Dr. Aundra Dubin   HPI: The patient is a 68 y/o AA female whom EP has been consulted to see regarding recurrent junctional bradycardia and consideration for PPM implantation. She has been seen since 2008 when she presented with atrial flutter with difficult to control ventricular rates. She underwent CTI ablation by Dr. Lovena Le at that time. She has also been followed in [facility] for diastolic dysfunction which has been worsened by her atrial fibrillation. Her other medical problems include HTN, HLD, chronic respiratory failure on home O2 (3L/min), DM and stage IV CKD.    She was recently admitted 05/25/14, after being found in junctional rhythm on a combination of amiodarone, metoprolol and Cardizem. She returned to SR and then back to atrial fibrillation. She was seen in by Dr. Rayann Heman on 06/11/14 and he felt that she failed amiodarone therapy and suggested another trial of rate control. She was discharged 06/12/14 on lopressor 75 bid and diltiazem 360 daily.  She was continued on Eliquis for stroke prophylaxis.  She was discharged home with Home Health services. Yesterday, 06/16/14, her [facility] nurse was concerned given diarrhea x 4 days and recorded pulse rates in the 40s. She called the office and was instructed to have the patient go to the ER.   On arrival to [facility], she was noted to have recurrent junctional bradycardia in the 40s, however reverted back to NSR. Her AV nodal blocking agents were intially placed on hold. K on arrival was WNL at 4.0. Mg also normal at 2.1. Troponin negative x 1. TSH elevated at 6.300. Free T4 pending. She was admitted by [facility]. [facility] consulted.  On telemetry, she has had no recurrent junctional rhythms, but she has had mild sinus bradycardia/NSR with rates in the upper 50s-low 60s. She was restarted this morning on lower doses of metoprolol and diltiazem at  25 mg BID and 120 mg daily respectively (was on 75 BID of metoprolol and 360 mg of diltiazem).  She reports that over the last few weeks, she has felt tired/fatigued, dizzy/lightheaded and notes occasional palpitations, however no syncope/near syncope.  Her diarrhea has resolved. No chest pain. She is chronically short of breath.    Past Medical History  Diagnosis Date  . Atrial flutter     ablated by Dr Lovena Le in 2008  . Hypertension   . Diabetes mellitus   . Diastolic heart failure     a. EF 60-65%, RV nl (03/2014)  . Asthma   . Hyperlipidemia   . Fatty liver   . Esophageal dysmotility   . Arthritis   . Sleep apnea     wears CPAP  . Fatty tumor fatty tumor back  . Coronary atherosclerosis of native coronary artery   . Morbid obesity   . Myocardial infarction 2009  . Heart murmur   . Peripheral vascular disease   . GERD (gastroesophageal reflux disease)     barrets esophagus  . Anginal pain     occ; non-ischemic Lexiscan 09/2012  . Kidney disease     CKD stage IV (Dr. Erling Cruz)  . Complication of anesthesia     " DIFFICULTY BREATHING "  . Persistent atrial fibrillation   . Tachycardia-bradycardia   . [name] esophagus   . Hiatal hernia   . Esophageal dysmotilities     Past Surgical History  Procedure Laterality Date  . Coronary angioplasty with stent placement    . Breast lumpectomy  right  . Tubal ligation    . Tonsillectomy    . Total knee arthroplasty Right 12/15/2013    Procedure: RIGHT TOTAL KNEE ARTHROPLASTY;  Surgeon: Alta Corning, MD;  Location: [place];  Service: Orthopedics;  Laterality: Right;  . Cardioversion N/A 03/03/2014    Procedure: CARDIOVERSION;  Surgeon: Laverda Page, MD;  Location: [facility] ENDOSCOPY;  Service: Cardiovascular;  Laterality: N/A;  . Atrial flutter ablation  2008    CTI ablation by Dr Lovena Le    Family History  Problem Relation Age of Onset  . Heart disease Mother   . Cancer Mother     bladder  . Kidney disease Mother    . Ovarian cancer Daughter   . Stomach cancer Maternal Uncle   . Colon cancer Maternal Aunt   . Esophageal cancer Neg Hx     Social History:  reports that she has quit smoking. She has never used smokeless tobacco. She reports that she does not drink alcohol or use illicit drugs.  Allergies:  Allergies  Allergen Reactions  . Adhesive [Tape] Itching    EKG leads  . Sulfa Antibiotics Itching and Nausea And Vomiting    "everything I seen was red"  . Codeine Nausea And Vomiting  . Penicillins Nausea And Vomiting    Medications:  Prior to Admission medications   Medication Sig Start Date End Date Taking? Authorizing Provider  acetaminophen (TYLENOL) 500 MG tablet Take 500 mg by mouth every 6 (six) hours as needed for mild pain.   Yes Historical Provider, MD  amitriptyline (ELAVIL) 25 MG tablet Take 25 mg by mouth at bedtime.     Yes Historical Provider, MD  apixaban (ELIQUIS) 5 MG TABS tablet Take 1 tablet (5 mg total) by mouth 2 (two) times daily. 04/29/14  Yes Jolaine Artist, MD  atorvastatin (LIPITOR) 40 MG tablet Take 1 tablet (40 mg total) by mouth daily. 04/29/14  Yes Jolaine Artist, MD  dicyclomine (BENTYL) 10 MG capsule Take 10 mg by mouth daily.    Yes Historical Provider, MD  diltiazem (CARDIZEM CD) 360 MG 24 hr capsule Take 1 capsule (360 mg total) by mouth daily. 06/12/14  Yes Rhonda G [name], PA-C  esomeprazole (NEXIUM) 40 MG capsule Take 1 capsule (40 mg total) by mouth daily at 12 noon. 04/29/14  Yes Jolaine Artist, MD  febuxostat (ULORIC) 40 MG tablet Take 40 mg by mouth daily.    Yes Historical Provider, MD  hydrALAZINE (APRESOLINE) 25 MG tablet Take 1 tablet (25 mg total) by mouth 3 (three) times daily. 06/03/14  Yes Rande Brunt, NP  insulin glargine (LANTUS) 100 UNIT/ML injection Inject 40 Units into the skin daily as needed. 12/23/13  Yes Delfina Redwood, MD  insulin lispro (HUMALOG) 100 UNIT/ML injection Inject 0.2 mLs (20 Units total) into the skin 3  (three) times daily after meals. 12/23/13  Yes Delfina Redwood, MD  levofloxacin (LEVAQUIN) 500 MG tablet Take 1 tablet (500 mg total) by mouth daily. 06/12/14  Yes Rhonda G [name], PA-C  Linaclotide (LINZESS) 145 MCG CAPS capsule Take 290 mcg by mouth daily.   Yes Historical Provider, MD  metoprolol tartrate (LOPRESSOR) 25 MG tablet Take 3 tablets (75 mg total) by mouth 2 (two) times daily. 06/03/14  Yes Rande Brunt, NP  potassium chloride SA (K-DUR,KLOR-CON) 20 MEQ tablet Take 1 tablet (20 mEq total) by mouth 2 (two) times daily. 04/02/14  Yes Jolaine Artist, MD  ranitidine (ZANTAC) 150 MG  tablet Take 1 tablet (150 mg total) by mouth at bedtime. 04/29/14  Yes Jolaine Artist, MD  torsemide (DEMADEX) 20 MG tablet Take 1-2 tablets (20-40 mg total) by mouth 2 (two) times daily. Take 40 mg a.m., 20 mg p.m. 06/12/14  Yes Rhonda G [name], PA-C  Vitamin D, Ergocalciferol, (DRISDOL) 50000 UNITS CAPS capsule Take 50,000 Units by mouth every Monday.   Yes Historical Provider, MD  XOPENEX HFA 45 MCG/ACT inhaler Inhale 2 puffs into the lungs every 4 (four) hours as needed for wheezing or shortness of breath. 03/11/14  Yes Marijean Heath, NP  zolpidem (AMBIEN) 10 MG tablet Take 10 mg by mouth at bedtime.    Yes Historical Provider, MD  nitroGLYCERIN (NITROSTAT) 0.4 MG SL tablet Place 0.4 mg under the tongue every 5 (five) minutes as needed for chest pain.     Historical Provider, MD     Results for orders placed during the hospital encounter of 06/16/14 (from the past 48 hour(s))  CBC WITH DIFFERENTIAL     Status: Abnormal   Collection Time    06/16/14  3:46 PM      Result Value Ref Range   WBC 7.5  4.0 - 10.5 K/uL   RBC 4.04  3.87 - 5.11 MIL/uL   Hemoglobin 12.0  12.0 - 15.0 g/dL   HCT 36.3  36.0 - 46.0 %   MCV 89.9  78.0 - 100.0 fL   MCH 29.7  26.0 - 34.0 pg   MCHC 33.1  30.0 - 36.0 g/dL   RDW 16.8 (*) 11.5 - 15.5 %   Platelets 262  150 - 400 K/uL   Neutrophils Relative % 60  43 - 77  %   Neutro Abs 4.5  1.7 - 7.7 K/uL   Lymphocytes Relative 22  12 - 46 %   Lymphs Abs 1.7  0.7 - 4.0 K/uL   Monocytes Relative 14 (*) 3 - 12 %   Monocytes Absolute 1.0  0.1 - 1.0 K/uL   Eosinophils Relative 4  0 - 5 %   Eosinophils Absolute 0.3  0.0 - 0.7 K/uL   Basophils Relative 0  0 - 1 %   Basophils Absolute 0.0  0.0 - 0.1 K/uL  COMPREHENSIVE METABOLIC PANEL     Status: Abnormal   Collection Time    06/16/14  3:46 PM      Result Value Ref Range   Sodium 142  137 - 147 mEq/L   Potassium 4.0  3.7 - 5.3 mEq/L   Chloride 105  96 - 112 mEq/L   CO2 22  19 - 32 mEq/L   Glucose, Bld 195 (*) 70 - 99 mg/dL   BUN 30 (*) 6 - 23 mg/dL   Creatinine, Ser 2.86 (*) 0.50 - 1.10 mg/dL   Calcium 9.6  8.4 - 10.5 mg/dL   Total Protein 7.9  6.0 - 8.3 g/dL   Albumin 3.4 (*) 3.5 - 5.2 g/dL   AST 24  0 - 37 U/L   ALT 24  0 - 35 U/L   Alkaline Phosphatase 94  39 - 117 U/L   Total Bilirubin 0.3  0.3 - 1.2 mg/dL   GFR calc non Af Amer 16 (*) >90 mL/min   GFR calc Af Amer 18 (*) >90 mL/min   Comment: (NOTE)     The eGFR has been calculated using the CKD EPI equation.     This calculation has not been validated in all clinical situations.  eGFR's persistently <90 mL/min signify possible Chronic Kidney     Disease.   Anion gap 15  5 - 15  MAGNESIUM     Status: None   Collection Time    06/16/14  3:46 PM      Result Value Ref Range   Magnesium 2.1  1.5 - 2.5 mg/dL  I-STAT TROPOININ, ED     Status: None   Collection Time    06/16/14  3:54 PM      Result Value Ref Range   Troponin i, poc 0.00  0.00 - 0.08 ng/mL   Comment 3            Comment: Due to the release kinetics of cTnI,     a negative result within the first hours     of the onset of symptoms does not rule out     myocardial infarction with certainty.     If myocardial infarction is still suspected,     repeat the test at appropriate intervals.  I-STAT CG4 LACTIC ACID, ED     Status: None   Collection Time    06/16/14  3:56 PM       Result Value Ref Range   Lactic Acid, Venous 1.71  0.5 - 2.2 mmol/L  CLOSTRIDIUM DIFFICILE BY PCR     Status: None   Collection Time    06/16/14  5:27 PM      Result Value Ref Range   C difficile by pcr NEGATIVE  NEGATIVE  TROPONIN I     Status: None   Collection Time    06/16/14  9:17 PM      Result Value Ref Range   Troponin I <0.30  <0.30 ng/mL   Comment:            Due to the release kinetics of cTnI,     a negative result within the first hours     of the onset of symptoms does not rule out     myocardial infarction with certainty.     If myocardial infarction is still suspected,     repeat the test at appropriate intervals.  TSH     Status: Abnormal   Collection Time    06/16/14  9:55 PM      Result Value Ref Range   TSH 6.300 (*) 0.350 - 4.500 uIU/mL  GLUCOSE, CAPILLARY     Status: Abnormal   Collection Time    06/16/14 10:12 PM      Result Value Ref Range   Glucose-Capillary 308 (*) 70 - 99 mg/dL  MRSA PCR SCREENING     Status: None   Collection Time    06/16/14 10:44 PM      Result Value Ref Range   MRSA by PCR NEGATIVE  NEGATIVE   Comment:            The GeneXpert MRSA Assay (FDA     approved for NASAL specimens     only), is one component of a     comprehensive MRSA colonization     surveillance program. It is not     intended to diagnose MRSA     infection nor to guide or     monitor treatment for     MRSA infections.  TROPONIN I     Status: None   Collection Time    06/17/14  2:57 AM      Result Value Ref Range   Troponin I <0.30  <0.30 ng/mL  Comment:            Due to the release kinetics of cTnI,     a negative result within the first hours     of the onset of symptoms does not rule out     myocardial infarction with certainty.     If myocardial infarction is still suspected,     repeat the test at appropriate intervals.  GLUCOSE, CAPILLARY     Status: Abnormal   Collection Time    06/17/14  7:57 AM      Result Value Ref Range    Glucose-Capillary 150 (*) 70 - 99 mg/dL    Dg Chest 2 View  06/16/2014   CLINICAL DATA:  Midsternal chest pain with difficulty breathing  EXAM: CHEST  2 VIEW  COMPARISON:  June 11, 2014  FINDINGS: There is no edema or consolidation. Heart is upper normal in size with pulmonary vascularity within normal limits. No adenopathy. No bone lesions.  IMPRESSION: No edema or consolidation.   Electronically Signed   By: Lowella Grip M.D.   On: 06/16/2014 16:26    Review of Systems  Constitutional: Positive for malaise/fatigue.  Respiratory: Negative for shortness of breath.   Cardiovascular: Positive for palpitations. Negative for chest pain.  Gastrointestinal: Positive for diarrhea.  Musculoskeletal: Positive for neck pain.  Neurological: Positive for dizziness. Negative for loss of consciousness.  All other systems reviewed and are negative.  Blood pressure 159/78, pulse 65, temperature 97.8 F (36.6 C), temperature source Oral, resp. rate 14, height 5' 4"  (1.626 m), weight 224 lb 10.4 oz (101.9 kg), SpO2 96.00%. Physical Exam  Constitutional: She is oriented to person, place, and time. She appears well-developed and well-nourished. No distress.  Neck: No JVD present. Carotid bruit is not present.  Cardiovascular: Normal rate and regular rhythm.  Exam reveals no gallop and no friction rub.   No murmur heard. Pulses:      Radial pulses are 2+ on the right side, and 2+ on the left side.       Dorsalis pedis pulses are 2+ on the right side, and 2+ on the left side.  Respiratory: Effort normal and breath sounds normal. No respiratory distress. She has no wheezes. She has no rales.  Musculoskeletal: She exhibits no edema.  Neurological: She is alert and oriented to person, place, and time.  Skin: Skin is warm and dry. She is not diaphoretic.  Psychiatric: She has a normal mood and affect. Her behavior is normal.    Assessment/Plan: Active Problems:   Diarrhea   Bradycardia  1.  Junctional Bradycardia: EKGs on arrival confirmed junctional bradycardia in the setting of AV nodal blocking therapy. Electrolytes were WNL on admit with K at 4.0 and Mg at 2.1. TSH elevated at 6.300. Free T4 pending. AV nodal blocking agents were initially held on admit. No further recurrent junctional rhythm overnight. AV nodal blocking agents have been restarted but at lower doses, now at 25 mg of metoprolol BID and 120 mg of diltiazem (was on 75 BID of metoprolol and 360 mg of diltiazem). She is tolerating decreased doses ok w/o recurrent junctional bradycardia. Now in NSR/SB with rates in the upper 50s-low 60s. Need for PPM will depend on how she tolerates her adjusted doses/ recurrence of significant bradycardia and/or recurrent Afib with RVR with need for higher rate control therapy. Follow Free T4 result and treat accordingly.   2. PAF: currently in NSR/SB. No Afib caputured on telemetry. She failed amiodarone therapy  secondary to bradycardia and has been intolerant to high doses of BB and CCB. If recurrent Afib with difficult to control ventricular rates, can consider repeat trial at AADT with Tikosyn. Will defer to MD. Continue Eliquis for anticoagulation.     Dr. Caryl Comes to follow with further recommendations.     SIMMONS, BRITTAINY 06/17/2014, 9:50 AM    She has had a series of hospitalizations associated with rapid atrial fibrillation/flutter on one hand and very slow heart rates 30-40 on the other. She has significant limitations of exercise tolerance which can be variable; she is not all that aware of arrhythmia specifically correlating with her functional status. Still however, it's hard to imagine that this lady with HFpEF isn't compromised by heart rates in the 30s with loss of atrial contraction and heart rates in the 130s with the same loss of atrial function.  The chart says that she "failed amiodarone" it is my impression that it was related to bradycardia. Dr. Greggory Brandy or GT may have  more insight into this. At this point I would have relatively low threshold episode in with pacing. Echocardiogram July 2015 demonstrated moderate left atrial enlargement 51/2.1 although her measurement was only 25.6  reassess her left atrial dimension as Dr. Greggory Brandy in his consult note of last week did raise the possibility of catheter ablation which if he thought were feasible, in with a 50-60% success rate, I think that that would be preferable especially if we can more definitively correlate her arrhythmia with her symptoms.  I am not at all sanguine that medical therapy without backup bradycardia pacing be successful. Clearly the dose of medication she is on now is mitigated her junctional rhythm; test will be when she first atrial fibrillation/flutter.  We'll plan to have a light breakfast in the morning. We'll get an echo in the morning. Dr. Charlett Nose reevaluate in the morning  Will hold am apixoban

## 2014-06-17 NOTE — Progress Notes (Signed)
Advanced Heart Failure Rounding Note   Subjective:    C. Diff negative.  Remains in NSR. No orthopnea or PND. No labs this am.  AVN blockers on hold. BP up. Did not have CPAP last night and multiple desats on tele.   Tele: NSR 50-60s. No junctional rhythm or AF   Objective:   Weight Range:  Vital Signs:   Temp:  [97.8 F (36.6 C)-98.2 F (36.8 C)] 97.8 F (36.6 C) (10/14 0759) Pulse Rate:  [40-68] 65 (10/14 0759) Resp:  [13-23] 14 (10/14 0759) BP: (112-165)/(65-127) 159/78 mmHg (10/14 0759) SpO2:  [92 %-100 %] 96 % (10/14 0759) Weight:  [101.9 kg (224 lb 10.4 oz)-102.059 kg (225 lb)] 101.9 kg (224 lb 10.4 oz) (10/13 2100)    Weight change: Filed Weights   06/16/14 1539 06/16/14 2100  Weight: 102.059 kg (225 lb) 101.9 kg (224 lb 10.4 oz)    Intake/Output:   Intake/Output Summary (Last 24 hours) at 06/17/14 0811 Last data filed at 06/17/14 0400  Gross per 24 hour  Intake      0 ml  Output   1300 ml  Net  -1300 ml    Physical Exam:  General: Obese, NAD  HEENT: normal  Neck: supple. JVP flat; Carotids 2+ bilat; no bruits. No lymphadenopathy or thryomegaly appreciate  Cor: PMI nonpalpable. Regular bradycardic no murmur  Lungs: Diminished in the bases  Abdomen: Obese soft, nontender. Non-distended. No hepatosplenomegaly. No bruits or masses. Good bowel sounds.  Extremities: no cyanosis, clubbing, rash, warm. No edema.  Neuro: alert & orientedx3, cranial nerves grossly intact. moves all 4 extremities w/o difficulty. Affect pleasant    Labs: Basic Metabolic Panel:  Recent Labs Lab 06/11/14 1300 06/12/14 0441 06/16/14 1546  NA 141 145 142  K 3.9 3.7 4.0  CL 102 103 105  CO2 24 27 22   GLUCOSE 220* 233* 195*  BUN 19 20 30*  CREATININE 2.22* 2.45* 2.86*  CALCIUM 9.4 8.9 9.6  MG  --   --  2.1    Liver Function Tests:  Recent Labs Lab 06/16/14 1546  AST 24  ALT 24  ALKPHOS 94  BILITOT 0.3  PROT 7.9  ALBUMIN 3.4*   No results found for this  basename: LIPASE, AMYLASE,  in the last 168 hours No results found for this basename: AMMONIA,  in the last 168 hours  CBC:  Recent Labs Lab 06/11/14 1300 06/12/14 0441 06/16/14 1546  WBC 6.7 5.6 7.5  NEUTROABS  --   --  4.5  HGB 12.6 12.4 12.0  HCT 38.9 38.9 36.3  MCV 91.1 93.1 89.9  PLT 303 265 262    Cardiac Enzymes:  Recent Labs Lab 06/16/14 2117 06/17/14 0257  TROPONINI <0.30 <0.30    BNP: BNP (last 3 results)  Recent Labs  03/03/14 1851 05/25/14 1808 06/09/14 1105  PROBNP 1531.0* 2123.0* 2594.0*     Other results:    Imaging: Dg Chest 2 View  06/16/2014   CLINICAL DATA:  Midsternal chest pain with difficulty breathing  EXAM: CHEST  2 VIEW  COMPARISON:  June 11, 2014  FINDINGS: There is no edema or consolidation. Heart is upper normal in size with pulmonary vascularity within normal limits. No adenopathy. No bone lesions.  IMPRESSION: No edema or consolidation.   Electronically Signed   By: Lowella Grip M.D.   On: 06/16/2014 16:26      Medications:     Scheduled Medications: . amitriptyline  25 mg Oral QHS  . apixaban  5 mg Oral BID  . atorvastatin  40 mg Oral Daily  . dicyclomine  10 mg Oral Daily  . diltiazem  180 mg Oral Daily  . famotidine  10 mg Oral BID  . febuxostat  40 mg Oral Daily  . hydrALAZINE  25 mg Oral TID  . insulin aspart  0-15 Units Subcutaneous TID WC  . insulin glargine  20 Units Subcutaneous QHS  . Linaclotide  290 mcg Oral Daily  . metoprolol tartrate  25 mg Oral BID  . potassium chloride SA  20 mEq Oral BID  . sodium chloride  3 mL Intravenous Q12H  . sodium chloride  3 mL Intravenous Q12H  . torsemide  20 mg Oral q1800  . torsemide  40 mg Oral Q breakfast  . [START ON 06/22/2014] Vitamin D (Ergocalciferol)  50,000 Units Oral Q Mon     Infusions:     PRN Medications:  sodium chloride, acetaminophen, acetaminophen, acetaminophen, hydrALAZINE, levalbuterol, nitroGLYCERIN, sodium chloride,  zolpidem   Assessment:   1. Diarrhea  --C. Diff negative. ? abx associated  2. Recurrent junction bradycardia  3. Tachy-brady syndrome  4. A/c renal failure, stage IV  5. Chronic diastolic HF  6. Chronic atrial fibrillation -> now back in NSR   Plan/Discussion:    She is now back in NSR/sinus brady. This has been complicated by tachy/brady syndrome. Has failed amio in past. Await EP input to see if they feel it is time for a PPM to help manage.   Will need her CPAP.   Hold AVN blockers. Use hydralazine for HTN.   Hold diuretics one more day. Check BMET.     Length of Stay: 1   Daniel Bensimhon 06/17/2014, 8:11 AM  Advanced Heart Failure Team Pager [phone] (M-F; 7a - 4p)  Please contact [facility] for night-coverage after hours (4p -7a ) and weekends on [email or web address]

## 2014-06-18 ENCOUNTER — Encounter (HOSPITAL_COMMUNITY): Payer: Self-pay | Admitting: *Deleted

## 2014-06-18 ENCOUNTER — Encounter (HOSPITAL_COMMUNITY)
Admission: EM | Disposition: A | Discharge status: Home Health | Payer: Self-pay | Source: Home / Self Care | Attending: Internal Medicine

## 2014-06-18 DIAGNOSIS — J9621 Acute and chronic respiratory failure with hypoxia: Secondary | ICD-10-CM

## 2014-06-18 DIAGNOSIS — I495 Sick sinus syndrome: Secondary | ICD-10-CM

## 2014-06-18 HISTORY — PX: PACEMAKER INSERTION: SHX728

## 2014-06-18 HISTORY — PX: PERMANENT PACEMAKER INSERTION: SHX5480

## 2014-06-18 LAB — GLUCOSE, CAPILLARY
Glucose-Capillary: 171 mg/dL — ABNORMAL HIGH (ref 70–99)
Glucose-Capillary: 190 mg/dL — ABNORMAL HIGH (ref 70–99)
Glucose-Capillary: 271 mg/dL — ABNORMAL HIGH (ref 70–99)
Glucose-Capillary: 169 mg/dL — ABNORMAL HIGH (ref 70–99)

## 2014-06-18 LAB — BASIC METABOLIC PANEL
Anion gap: 15 (ref 5–15)
BUN: 26 mg/dL — ABNORMAL HIGH (ref 6–23)
CO2: 23 mEq/L (ref 19–32)
Calcium: 9.2 mg/dL (ref 8.4–10.5)
Chloride: 104 mEq/L (ref 96–112)
Creatinine, Ser: 2.51 mg/dL — ABNORMAL HIGH (ref 0.50–1.10)
GFR calc Af Amer: 22 mL/min — ABNORMAL LOW (ref >90)
GFR calc non Af Amer: 19 mL/min — ABNORMAL LOW (ref >90)
Glucose, Bld: 170 mg/dL — ABNORMAL HIGH (ref 70–99)
Potassium: 3.5 mEq/L — ABNORMAL LOW (ref 3.7–5.3)
Sodium: 142 mEq/L (ref 137–147)

## 2014-06-18 SURGERY — PERMANENT PACEMAKER INSERTION
Anesthesia: LOCAL

## 2014-06-18 MED ORDER — CHLORHEXIDINE GLUCONATE 4 % EX LIQD
60.0000 mL | Freq: Once | CUTANEOUS | Status: AC
  Administered 2014-06-18: 4 via TOPICAL

## 2014-06-18 MED ORDER — ACETAMINOPHEN 325 MG PO TABS: 325.0000 mg | ORAL_TABLET | ORAL | Status: DC | PRN

## 2014-06-18 MED ORDER — POTASSIUM CHLORIDE CRYS ER 20 MEQ PO TBCR
40.0000 meq | EXTENDED_RELEASE_TABLET | Freq: Once | ORAL | Status: AC
  Administered 2014-06-18: 40 meq via ORAL

## 2014-06-18 MED ORDER — CHLORHEXIDINE GLUCONATE 4 % EX LIQD: 60.0000 mL | Freq: Once | CUTANEOUS | Status: DC

## 2014-06-18 MED ORDER — SODIUM CHLORIDE 0.9 % IR SOLN
80.0000 mg | Status: DC
  Filled 2014-06-18 (×2): qty 2

## 2014-06-18 MED ORDER — SODIUM CHLORIDE 0.9 % IV SOLN: 250.0000 mL | INTRAVENOUS | Status: DC | PRN

## 2014-06-18 MED ORDER — LIDOCAINE HCL (PF) 1 % IJ SOLN
INTRAMUSCULAR | Status: AC
  Filled 2014-06-18: qty 30

## 2014-06-18 MED ORDER — VANCOMYCIN HCL IN DEXTROSE 1-5 GM/200ML-% IV SOLN
1000.0000 mg | Freq: Two times a day (BID) | INTRAVENOUS | Status: AC
  Administered 2014-06-18: 1000 mg via INTRAVENOUS
  Filled 2014-06-18: qty 200

## 2014-06-18 MED ORDER — LIDOCAINE HCL (PF) 1 % IJ SOLN
INTRAMUSCULAR | Status: AC
Start: 2014-06-18 — End: 2014-06-18
  Filled 2014-06-18: qty 30

## 2014-06-18 MED ORDER — MIDAZOLAM HCL 5 MG/5ML IJ SOLN
INTRAMUSCULAR | Status: AC
  Filled 2014-06-18: qty 5

## 2014-06-18 MED ORDER — HYDROCODONE-ACETAMINOPHEN 5-325 MG PO TABS
1.0000 | ORAL_TABLET | ORAL | Status: DC | PRN
  Administered 2014-06-18 – 2014-06-19 (×4): 2 via ORAL
  Filled 2014-06-18 (×4): qty 2

## 2014-06-18 MED ORDER — FENTANYL CITRATE 0.05 MG/ML IJ SOLN
INTRAMUSCULAR | Status: AC
  Filled 2014-06-18: qty 2

## 2014-06-18 MED ORDER — DILTIAZEM HCL ER COATED BEADS 180 MG PO CP24
180.0000 mg | ORAL_CAPSULE | Freq: Every day | ORAL | Status: DC
  Administered 2014-06-18 – 2014-06-20 (×3): 180 mg via ORAL
  Filled 2014-06-18 (×5): qty 1

## 2014-06-18 MED ORDER — SODIUM CHLORIDE 0.9 % IJ SOLN
3.0000 mL | Freq: Two times a day (BID) | INTRAMUSCULAR | Status: DC
  Administered 2014-06-18 – 2014-06-20 (×4): 3 mL via INTRAVENOUS

## 2014-06-18 MED ORDER — ONDANSETRON HCL 4 MG/2ML IJ SOLN: 4.0000 mg | Freq: Four times a day (QID) | INTRAMUSCULAR | Status: DC | PRN

## 2014-06-18 MED ORDER — HEPARIN (PORCINE) IN NACL 2-0.9 UNIT/ML-% IJ SOLN
INTRAMUSCULAR | Status: AC
  Filled 2014-06-18: qty 500

## 2014-06-18 MED ORDER — SODIUM CHLORIDE 0.9 % IJ SOLN: 3.0000 mL | INTRAMUSCULAR | Status: DC | PRN

## 2014-06-18 MED ORDER — VANCOMYCIN HCL IN DEXTROSE 1-5 GM/200ML-% IV SOLN
1000.0000 mg | INTRAVENOUS | Status: DC
  Filled 2014-06-18 (×2): qty 200

## 2014-06-18 NOTE — Op Note (Signed)
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 Left upper extremity venography.   2. Pacemaker implantation.     INTRODUCTION:  [name] is a 68 y.o. female with a history of persistent atrial fibrillation, tachycardia- bradycardia syndrome and sinus bradycardia who presents today for pacemaker implantation.  The patient reports intermittent episodes of fatigue and SOB.  She has been documented to have afib with difficult to control V rates.  During sinus rhythm, she has symptomatic bradycardia which further limits therapy.  No reversible causes have been identified.  The patient therefore presents today for pacemaker implantation.     DESCRIPTION OF PROCEDURE:  Informed written consent was obtained, and  the patient was brought to the electrophysiology lab in a fasting state.  The patient required no sedation for the procedure today.  The patients left chest was prepped and draped in the usual sterile fashion by the EP lab staff. The skin overlying the left deltopectoral region was infiltrated with lidocaine for local analgesia.  A 4-cm incision was made over the left deltopectoral region.  A left subcutaneous pacemaker pocket was fashioned using a combination of sharp and blunt dissection. Electrocautery was required to assure hemostasis.    The left axillary vein was cannulated however a wire could not be advanced into the subclavian vein.  The cephalic vein was very small and not suitable for cannulation.  I therefore elected to perform a venogram of the left upper extremity.  Left Upper Extremity Venography: A venogram of the left upper extremity was performed by hand injection of 15cc of contrast, which revealed a very small and atretic cephalic vein vein, the axillary vein was large but spasmed down  to near occlusion when joining the subclavian vein.    RA/RV Lead Placement:  Given the above noted spasm/ near occlusion of the axillary vein, subclavian access could not be achieved through the cephalic, axillary, or proximal subclavian veins.  I therefore had to perform a very medial stick to access the more distal subclavian vein.  I felt that pneumothorax risks were increased and therefore did use a micropuncture technique to access the medial subclavian vein.  Through the left subclavian vein, a [employer] model [phone] (serial number  [id number]) right atrial lead and a [facility] model [phone] (serial number  [id number]) right ventricular lead were advanced with fluoroscopic visualization into the right atrial appendage and right ventricular apex positions respectively.  Initial atrial lead P- waves measured 5 mV with impedance of 490 ohms and a threshold of 1.6 V at 0.5 msec.  Right ventricular lead R-waves measured 16 mV with an impedance of 698 ohms and a threshold of 0.6 V at 0.5 msec.  Both leads were secured to the pectoralis fascia using #2-0 silk over the suture sleeves.   Device Placement:  The leads were then connected to a [place] DR  model [id number] (serial number  [id number]) pacemaker.  The pocket was irrigated with copious gentamicin solution.  The pacemaker was then placed into the pocket.  The pocket was then closed in 2 layers with 2.0 Vicryl suture for the subcutaneous and subcuticular layers.  Steri-  Strips and a sterile dressing were then applied. EBL<22ml.  There were no early apparent complications.     CONCLUSIONS:   1. Successful implantation  of a [place] DR  dual-chamber pacemaker for symptomatic bradycardia and tachy/brady syndrome  2. No early apparent complications.           [name], MD 06/18/2014 9:50 AM

## 2014-06-18 NOTE — Progress Notes (Signed)
Progress Note  [name] [id number] DOB: [birth year] DOA: 06/16/2014 PCP: [name], MD   Brief narrative: 68 year old female patient with underlying atrial fibrillation as well as diabetes and stage IV chronic kidney disease who was hospitalized late September after experiencing symptomatic junctional rhythm while utilizing amiodarone, metoprolol and cardizem.  Medication adjustments were made and she improved to sinus rhythm then back into atrial fibrillation.  She returned to the hospital, was admitted for questionable pneumonia, and discharged home on Levaquin 10/9, as well as Lopressor 75 twice a day and diltiazem 360 daily.   On the date of this admission her home health nurse was concerned about ongoing diarrhea for 4 days and pulses in the 40s. The cardiology office was consulted and the patient was instructed to present to the emergency department.  In the ER she was noted to have recurrent junctional bradycardia rates in the 40s but apparently spontaneously back into sinus rhythm. Her AV nodal blocking agents were held by the admitting physician. In regards to her diarrhea she was not having any abdominal pain. The stools were nonbloody. During the previous admission was felt the patient may have had a pneumonia process so she was discharged home on Levaquin.  HPI/Subjective: Pt slightly drowsy after the procedure. No chest pain or shortness of breath.  Assessment/Plan:  Parox Atrial fibrillation / Junctional (nodal) bradycardia Appreciate cardiology assistance - currently in sinus bradycardia with rates greater than 55-60 - low-dose metoprolol had been resumed overnight as well as low dose calcium channel blocker - for now cardiology will continue these medications - EP has also been consulted i they have recommended that insertion of pacemaker, she underwent PPM placement earlier today on 10/15. continue eliquis  Abnormal TSH TSH 6.3 - normal free t4.    Diarrhea C. difficile PCR negative  DMtype 2 with renal complications CBGs labile between 150 and 308 - home insulin orders were reviewed: Has 2 Lantus orders 1 for 20 units daily at bedtime and another from 40 units into the skin daily as needed which does not make sense - also on Humalog insulin 20 units with meals - A1c last admit 7.4 - for now will continue 20 units of Lantus at bedtime and add back 20 units meal coverage with sliding scale coverage  Acute on chronic respiratory failure with hypoxia:   A) Obstructive sleep apnea   B) Chronic diastolic heart failure Currently well compensated - patient denies noncompliance with CPAP at home - continue home diuretics  GERD  Continue Pepcid  Chronic kidney disease stage IV (severe) Current renal function remains stable - baseline crt appears to be ~2.3-2.5  Hypertension Blood pressure well controlled-when necessary meds available   DVT prophylaxis: Eliquis Code Status: Full Family Communication: No family at bedside Disposition Plan/Expected LOS: possible d/c in 1 to 2 days.  Consultants: Cardiology Electrophysiology  Procedures: PPM placement.  Venography LUE Cultures: None  Antibiotics: None  Objective: Blood pressure 169/70, pulse 63, temperature 98.2 F (36.8 C), temperature source Oral, resp. rate 17, height 5\' 4"  (1.626 m), weight 99.837 kg (220 lb 1.6 oz), SpO2 100.00%. No intake or output data in the 24 hours ending 06/18/14 1356 Exam: Gen: No acute respiratory distress Chest: Clear to auscultation bilaterally without wheezes, rhonchi or crackles, 2 L Cardiac: Regular occasionally bradycardic rate and rhythm, S1-S2, no rubs murmurs or gallops, no peripheral edema, no JVD Abdomen: Soft nontender nondistended without obvious hepatosplenomegaly, no ascites Extremities: Symmetrical in appearance without cyanosis, clubbing or  effusion  Scheduled Meds:  Scheduled Meds: . amitriptyline  25 mg Oral QHS  .  atorvastatin  40 mg Oral Daily  . dicyclomine  10 mg Oral Daily  . diltiazem  180 mg Oral Daily  . famotidine  10 mg Oral BID  . febuxostat  40 mg Oral Daily  . hydrALAZINE  25 mg Oral TID  . insulin aspart  0-5 Units Subcutaneous QHS  . insulin aspart  0-9 Units Subcutaneous TID WC  . insulin aspart  20 Units Subcutaneous TID WC  . insulin glargine  20 Units Subcutaneous QHS  . Linaclotide  290 mcg Oral Daily  . metoprolol tartrate  25 mg Oral BID  . potassium chloride SA  20 mEq Oral TID  . sodium chloride  3 mL Intravenous Q12H  . torsemide  20 mg Oral q1800  . torsemide  40 mg Oral Q breakfast  . vancomycin  1,000 mg Intravenous Q12H  . [START ON 06/22/2014] Vitamin D (Ergocalciferol)  50,000 Units Oral Q Mon   Data Reviewed: Basic Metabolic Panel:  Recent Labs Lab 06/12/14 0441 06/16/14 1546 06/17/14 0855 06/18/14 0500  NA 145 142 146 142  K 3.7 4.0 3.5* 3.5*  CL 103 105 106 104  CO2 27 22 25 23   GLUCOSE 233* 195* 149* 170*  BUN 20 30* 27* 26*  CREATININE 2.45* 2.86* 2.57* 2.51*  CALCIUM 8.9 9.6 9.4 9.2  MG  --  2.1  --   --    Liver Function Tests:  Recent Labs Lab 06/16/14 1546  AST 24  ALT 24  ALKPHOS 94  BILITOT 0.3  PROT 7.9  ALBUMIN 3.4*   CBC:  Recent Labs Lab 06/12/14 0441 06/16/14 1546  WBC 5.6 7.5  NEUTROABS  --  4.5  HGB 12.4 12.0  HCT 38.9 36.3  MCV 93.1 89.9  PLT 265 262   Cardiac Enzymes:  Recent Labs Lab 06/16/14 2117 06/17/14 0257 06/17/14 0855  TROPONINI <0.30 <0.30 <0.30   BNP (last 3 results)  Recent Labs  03/03/14 1851 05/25/14 1808 06/09/14 1105  PROBNP 1531.0* 2123.0* 2594.0*   CBG:  Recent Labs Lab 06/17/14 1155 06/17/14 1624 06/17/14 2209 06/18/14 0747 06/18/14 1120  GLUCAP 248* 181* 155* 171* 190*    Recent Results (from the past 240 hour(s))  CLOSTRIDIUM DIFFICILE BY PCR     Status: None   Collection Time    06/16/14  5:27 PM      Result Value Ref Range Status   C difficile by pcr NEGATIVE   NEGATIVE Final  MRSA PCR SCREENING     Status: None   Collection Time    06/16/14 10:44 PM      Result Value Ref Range Status   MRSA by PCR NEGATIVE  NEGATIVE Final   Comment:            The GeneXpert MRSA Assay (FDA     approved for NASAL specimens     only), is one component of a     comprehensive MRSA colonization     surveillance program. It is not     intended to diagnose MRSA     infection nor to guide or     monitor treatment for     MRSA infections.       Time spent :  35 mins  Hosie Poisson, MD  [facility] Office  [phone] Pager [phone] On-Call/Text Page:      [email or web address]      password [facility]  If 7PM-7AM, please contact night-coverage [email or web address] Password [email or web address] 06/18/2014, 1:56 PM   LOS: 2 days

## 2014-06-18 NOTE — Care Management Note (Addendum)
    Page 1 of 1   06/19/2014     10:28:55 AM CARE MANAGEMENT NOTE 06/19/2014  Patient:  [name], [name]   Account Number:  [id number]  Date Initiated:  06/18/2014  Documentation initiated by:  GRAVES-BIGELOW,Susanne Baumgarner  Subjective/Objective Assessment:   Pt admitted for chest pain, sob and low HR. Tachybrady syndrome- EP consulted and placed PPM 06-18-14.     Action/Plan:   Pt is currently active with [facility] and will need resumption orders for [facility], PT, Aide. [name] is aware that pt is here in hospital.   Anticipated DC Date:  06/19/2014   Anticipated DC Plan:  [place]  CM consult      [facility] Choice  Resumption Of Svcs/PTA Provider  HOME HEALTH   Choice offered to / List presented to:          [facility] arranged  HH-1 RN  [place] agency  [place]   Status of service:  Completed, signed off Medicare Important Message given?  YES (If response is "NO", the following Medicare IM given date fields will be blank) Date Medicare IM given:  06/19/2014 Medicare IM given by:  GRAVES-BIGELOW,Dierdre Mccalip Date Additional Medicare IM given:   Additional Medicare IM given by:    Discharge Disposition:  [place]  Per UR Regulation:  Reviewed for med. necessity/level of care/duration of stay  If discussed at [place] of Stay Meetings, dates discussed:    Comments:

## 2014-06-18 NOTE — Interval H&P Note (Signed)
History and Physical Interval Note:  06/18/2014 8:15 AM  [name]  has presented today for surgery, with the diagnosis of bradicardia  The various methods of treatment have been discussed with the patient and family. After consideration of risks, benefits and other options for treatment, the patient has consented to  Procedure(s): PERMANENT PACEMAKER INSERTION (N/A) as a surgical intervention .  The patient's history has been reviewed, patient examined, no change in status, stable for surgery.  I have reviewed the patient's chart and labs.  Questions were answered to the patient's satisfaction.     [name]

## 2014-06-18 NOTE — Progress Notes (Signed)
Placed patient on CPAP 5cmH20 with 3L 02 bleed in.  Patient is tolerating well at this time.

## 2014-06-18 NOTE — Progress Notes (Signed)
SUBJECTIVE: The patient is doing well today.  At this time, she denies chest pain, shortness of breath, or any new concerns.  Denies fevers, chills, or diarrhea.  CURRENT MEDICATIONS: . amitriptyline  25 mg Oral QHS  . apixaban  5 mg Oral BID  . atorvastatin  40 mg Oral Daily  . dicyclomine  10 mg Oral Daily  . famotidine  10 mg Oral BID  . febuxostat  40 mg Oral Daily  . hydrALAZINE  25 mg Oral TID  . insulin aspart  0-5 Units Subcutaneous QHS  . insulin aspart  0-9 Units Subcutaneous TID WC  . insulin aspart  20 Units Subcutaneous TID WC  . insulin glargine  20 Units Subcutaneous QHS  . Linaclotide  290 mcg Oral Daily  . metoprolol tartrate  25 mg Oral BID  . potassium chloride SA  20 mEq Oral TID  . sodium chloride  3 mL Intravenous Q12H  . sodium chloride  3 mL Intravenous Q12H  . torsemide  20 mg Oral q1800  . torsemide  40 mg Oral Q breakfast  . [START ON 06/22/2014] Vitamin D (Ergocalciferol)  50,000 Units Oral Q Mon      OBJECTIVE: Physical Exam: Filed Vitals:   06/17/14 1443 06/17/14 1700 06/17/14 2214 06/18/14 0518  BP: 157/72 164/80 146/71 124/66  Pulse: 59 60 63 63  Temp: 98.3 F (36.8 C)  97.9 F (36.6 C) 98.2 F (36.8 C)  TempSrc: Oral  Axillary Oral  Resp:  17    Height:      Weight:    220 lb 1.6 oz (99.837 kg)  SpO2: 96% 95% 97% 100%    Intake/Output Summary (Last 24 hours) at 06/18/14 [id number] Last data filed at 06/17/14 1330  Gross per 24 hour  Intake      0 ml  Output   2100 ml  Net  -2100 ml    Telemetry reveals sinus rhythm  GEN- The patient is overweight appearing, alert and oriented x 3 today.   Head- normocephalic, atraumatic Eyes-  Sclera clear, conjunctiva pink Ears- hearing intact Oropharynx- clear Neck- supple, no JVP Lungs- Clear to ausculation bilaterally, normal work of breathing Heart- Regular rate and rhythm, no murmurs, rubs or gallops, PMI not laterally displaced GI- soft, NT, ND, + BS Extremities- no clubbing,  cyanosis, or edema Skin- no rash or lesion Psych- euthymic mood, full affect Neuro- strength and sensation are intact  LABS: Basic Metabolic Panel:  Recent Labs  06/16/14 1546 06/17/14 0855  NA 142 146  K 4.0 3.5*  CL 105 106  CO2 22 25  GLUCOSE 195* 149*  BUN 30* 27*  CREATININE 2.86* 2.57*  CALCIUM 9.6 9.4  MG 2.1  --    Liver Function Tests:  Recent Labs  06/16/14 1546  AST 24  ALT 24  ALKPHOS 94  BILITOT 0.3  PROT 7.9  ALBUMIN 3.4*   CBC:  Recent Labs  06/16/14 1546  WBC 7.5  NEUTROABS 4.5  HGB 12.0  HCT 36.3  MCV 89.9  PLT 262   Cardiac Enzymes:  Recent Labs  06/16/14 2117 06/17/14 0257 06/17/14 0855  TROPONINI <0.30 <0.30 <0.30   Thyroid Function Tests:  Recent Labs  06/16/14 2155  TSH 6.300*    RADIOLOGY: Dg Chest 2 View 06/16/2014   CLINICAL DATA:  Midsternal chest pain with difficulty breathing  EXAM: CHEST  2 VIEW  COMPARISON:  June 11, 2014  FINDINGS: There is no edema or consolidation. Heart is  upper normal in size with pulmonary vascularity within normal limits. No adenopathy. No bone lesions.  IMPRESSION: No edema or consolidation.   Electronically Signed   By: Lowella Grip M.D.   On: 06/16/2014 16:26   Dg Chest 2 View 06/11/2014   CLINICAL DATA:  Acute  on chronic dyspnea  EXAM: CHEST  2 VIEW  COMPARISON:  06/09/2014  FINDINGS: Cardiomediastinal silhouette is stable. Persistent residual mild interstitial prominence bilaterally without convincing pulmonary edema. No segmental infiltrate. Mild degenerative changes thoracic spine.  IMPRESSION: Persistent residual mild interstitial prominence bilaterally without convincing pulmonary edema. No segmental infiltrate.   Electronically Signed   By: Lahoma Crocker M.D.   On: 06/11/2014 15:16    ASSESSMENT AND PLAN:  Active Problems:   GERD (gastroesophageal reflux disease)   Obstructive sleep apnea   Chronic kidney disease (CKD), stage IV (severe)   DM (diabetes mellitus), type 2 with  renal complications   Chronic diastolic heart failure   Hypertension   Atrial fibrillation   Junctional (nodal) bradycardia   Acute on chronic respiratory failure with hypoxia   Diarrhea   Abnormal TSH  1. afib Very difficult management situation. She has clear tachy/brady syndrome.  When in afib, V rates are very fast and she is quite symptomatic.  Though she does much better when rate controlled, she then has sinus bradycardia with junctional rhythm.  She is symptomatic with her sinus bradycardia.  Given renal failure and comorbidities, she is not a great candidate for ablation.  Her AAD options are limited. The patient has symptomatic bradycardia.  I would therefore recommend pacemaker implantation at this time.  Risks, benefits, alternatives to pacemaker implantation were discussed in detail with the patient today. The patient understands that the risks include but are not limited to bleeding, infection, pneumothorax, perforation, tamponade, vascular damage, renal failure, MI, stroke, death,  and lead dislodgement and wishes to proceed. We will therefore proceed at this time. She is chronically anticoagulated with eliquis.

## 2014-06-18 NOTE — H&P (View-Only) (Signed)
SUBJECTIVE: The patient is doing well today.  At this time, she denies chest pain, shortness of breath, or any new concerns.  Denies fevers, chills, or diarrhea.  CURRENT MEDICATIONS: . amitriptyline  25 mg Oral QHS  . apixaban  5 mg Oral BID  . atorvastatin  40 mg Oral Daily  . dicyclomine  10 mg Oral Daily  . famotidine  10 mg Oral BID  . febuxostat  40 mg Oral Daily  . hydrALAZINE  25 mg Oral TID  . insulin aspart  0-5 Units Subcutaneous QHS  . insulin aspart  0-9 Units Subcutaneous TID WC  . insulin aspart  20 Units Subcutaneous TID WC  . insulin glargine  20 Units Subcutaneous QHS  . Linaclotide  290 mcg Oral Daily  . metoprolol tartrate  25 mg Oral BID  . potassium chloride SA  20 mEq Oral TID  . sodium chloride  3 mL Intravenous Q12H  . sodium chloride  3 mL Intravenous Q12H  . torsemide  20 mg Oral q1800  . torsemide  40 mg Oral Q breakfast  . [START ON 06/22/2014] Vitamin D (Ergocalciferol)  50,000 Units Oral Q Mon      OBJECTIVE: Physical Exam: Filed Vitals:   06/17/14 1443 06/17/14 1700 06/17/14 2214 06/18/14 0518  BP: 157/72 164/80 146/71 124/66  Pulse: 59 60 63 63  Temp: 98.3 F (36.8 C)  97.9 F (36.6 C) 98.2 F (36.8 C)  TempSrc: Oral  Axillary Oral  Resp:  17    Height:      Weight:    220 lb 1.6 oz (99.837 kg)  SpO2: 96% 95% 97% 100%    Intake/Output Summary (Last 24 hours) at 06/18/14 [id number] Last data filed at 06/17/14 1330  Gross per 24 hour  Intake      0 ml  Output   2100 ml  Net  -2100 ml    Telemetry reveals sinus rhythm  GEN- The patient is overweight appearing, alert and oriented x 3 today.   Head- normocephalic, atraumatic Eyes-  Sclera clear, conjunctiva pink Ears- hearing intact Oropharynx- clear Neck- supple, no JVP Lungs- Clear to ausculation bilaterally, normal work of breathing Heart- Regular rate and rhythm, no murmurs, rubs or gallops, PMI not laterally displaced GI- soft, NT, ND, + BS Extremities- no clubbing,  cyanosis, or edema Skin- no rash or lesion Psych- euthymic mood, full affect Neuro- strength and sensation are intact  LABS: Basic Metabolic Panel:  Recent Labs  06/16/14 1546 06/17/14 0855  NA 142 146  K 4.0 3.5*  CL 105 106  CO2 22 25  GLUCOSE 195* 149*  BUN 30* 27*  CREATININE 2.86* 2.57*  CALCIUM 9.6 9.4  MG 2.1  --    Liver Function Tests:  Recent Labs  06/16/14 1546  AST 24  ALT 24  ALKPHOS 94  BILITOT 0.3  PROT 7.9  ALBUMIN 3.4*   CBC:  Recent Labs  06/16/14 1546  WBC 7.5  NEUTROABS 4.5  HGB 12.0  HCT 36.3  MCV 89.9  PLT 262   Cardiac Enzymes:  Recent Labs  06/16/14 2117 06/17/14 0257 06/17/14 0855  TROPONINI <0.30 <0.30 <0.30   Thyroid Function Tests:  Recent Labs  06/16/14 2155  TSH 6.300*    RADIOLOGY: Dg Chest 2 View 06/16/2014   CLINICAL DATA:  Midsternal chest pain with difficulty breathing  EXAM: CHEST  2 VIEW  COMPARISON:  June 11, 2014  FINDINGS: There is no edema or consolidation. Heart is  upper normal in size with pulmonary vascularity within normal limits. No adenopathy. No bone lesions.  IMPRESSION: No edema or consolidation.   Electronically Signed   By: Lowella Grip M.D.   On: 06/16/2014 16:26   Dg Chest 2 View 06/11/2014   CLINICAL DATA:  Acute  on chronic dyspnea  EXAM: CHEST  2 VIEW  COMPARISON:  06/09/2014  FINDINGS: Cardiomediastinal silhouette is stable. Persistent residual mild interstitial prominence bilaterally without convincing pulmonary edema. No segmental infiltrate. Mild degenerative changes thoracic spine.  IMPRESSION: Persistent residual mild interstitial prominence bilaterally without convincing pulmonary edema. No segmental infiltrate.   Electronically Signed   By: Lahoma Crocker M.D.   On: 06/11/2014 15:16    ASSESSMENT AND PLAN:  Active Problems:   GERD (gastroesophageal reflux disease)   Obstructive sleep apnea   Chronic kidney disease (CKD), stage IV (severe)   DM (diabetes mellitus), type 2 with  renal complications   Chronic diastolic heart failure   Hypertension   Atrial fibrillation   Junctional (nodal) bradycardia   Acute on chronic respiratory failure with hypoxia   Diarrhea   Abnormal TSH  1. afib Very difficult management situation. She has clear tachy/brady syndrome.  When in afib, V rates are very fast and she is quite symptomatic.  Though she does much better when rate controlled, she then has sinus bradycardia with junctional rhythm.  She is symptomatic with her sinus bradycardia.  Given renal failure and comorbidities, she is not a great candidate for ablation.  Her AAD options are limited. The patient has symptomatic bradycardia.  I would therefore recommend pacemaker implantation at this time.  Risks, benefits, alternatives to pacemaker implantation were discussed in detail with the patient today. The patient understands that the risks include but are not limited to bleeding, infection, pneumothorax, perforation, tamponade, vascular damage, renal failure, MI, stroke, death,  and lead dislodgement and wishes to proceed. We will therefore proceed at this time. She is chronically anticoagulated with eliquis.

## 2014-06-18 NOTE — Progress Notes (Addendum)
Advanced Heart Failure Rounding Note   Subjective:    Remains in NSR. No orthopnea or PND. Remains on diltiazem. Went for PPM this am.   Objective:   Weight Range:  Vital Signs:   Temp:  [97.9 F (36.6 C)-98.3 F (36.8 C)] 98.2 F (36.8 C) (10/15 0518) Pulse Rate:  [59-63] 63 (10/15 0518) Resp:  [17] 17 (10/14 1700) BP: (124-164)/(66-80) 124/66 mmHg (10/15 0518) SpO2:  [95 %-100 %] 100 % (10/15 0518) Weight:  [220 lb 1.6 oz (99.837 kg)] 220 lb 1.6 oz (99.837 kg) (10/15 0518) Last BM Date: 06/17/14  Weight change: Filed Weights   06/16/14 1539 06/16/14 2100 06/18/14 0518  Weight: 225 lb (102.059 kg) 224 lb 10.4 oz (101.9 kg) 220 lb 1.6 oz (99.837 kg)    Intake/Output:   Intake/Output Summary (Last 24 hours) at 06/18/14 1230 Last data filed at 06/17/14 1330  Gross per 24 hour  Intake      0 ml  Output   2100 ml  Net  -2100 ml    Physical Exam:  General: Obese, NAD, lethargis HEENT: normal  Neck: supple. JVP difficult to assess d/t body habitus but does not appear elevated; Carotids 2+ bilat; no bruits. No lymphadenopathy or thryomegaly appreciate  Cor: PMI nonpalpable. Regular rate and rhythm. Pacer dressing in place. L arm in sling.  Lungs: Diminished in the bases  Abdomen: Obese soft, nontender. Non-distended. No hepatosplenomegaly. No bruits or masses. Good bowel sounds.  Extremities: no cyanosis, clubbing, rash, warm. No edema.  Neuro: alert & orientedx3, cranial nerves grossly intact. moves all 4 extremities w/o difficulty. Affect pleasant    Labs: Basic Metabolic Panel:  Recent Labs Lab 06/11/14 1300 06/12/14 0441 06/16/14 1546 06/17/14 0855 06/18/14 0500  NA 141 145 142 146 142  K 3.9 3.7 4.0 3.5* 3.5*  CL 102 103 105 106 104  CO2 24 27 22 25 23   GLUCOSE 220* 233* 195* 149* 170*  BUN 19 20 30* 27* 26*  CREATININE 2.22* 2.45* 2.86* 2.57* 2.51*  CALCIUM 9.4 8.9 9.6 9.4 9.2  MG  --   --  2.1  --   --     Liver Function Tests:  Recent Labs Lab  06/16/14 1546  AST 24  ALT 24  ALKPHOS 94  BILITOT 0.3  PROT 7.9  ALBUMIN 3.4*   No results found for this basename: LIPASE, AMYLASE,  in the last 168 hours No results found for this basename: AMMONIA,  in the last 168 hours  CBC:  Recent Labs Lab 06/11/14 1300 06/12/14 0441 06/16/14 1546  WBC 6.7 5.6 7.5  NEUTROABS  --   --  4.5  HGB 12.6 12.4 12.0  HCT 38.9 38.9 36.3  MCV 91.1 93.1 89.9  PLT 303 265 262    Cardiac Enzymes:  Recent Labs Lab 06/16/14 2117 06/17/14 0257 06/17/14 0855  TROPONINI <0.30 <0.30 <0.30    BNP: BNP (last 3 results)  Recent Labs  03/03/14 1851 05/25/14 1808 06/09/14 1105  PROBNP 1531.0* 2123.0* 2594.0*     Other results:    Imaging: Dg Chest 2 View  06/16/2014   CLINICAL DATA:  Midsternal chest pain with difficulty breathing  EXAM: CHEST  2 VIEW  COMPARISON:  June 11, 2014  FINDINGS: There is no edema or consolidation. Heart is upper normal in size with pulmonary vascularity within normal limits. No adenopathy. No bone lesions.  IMPRESSION: No edema or consolidation.   Electronically Signed   By: Lowella Grip M.D.  On: 06/16/2014 16:26     Medications:     Scheduled Medications: . amitriptyline  25 mg Oral QHS  . atorvastatin  40 mg Oral Daily  . dicyclomine  10 mg Oral Daily  . diltiazem  180 mg Oral Daily  . famotidine  10 mg Oral BID  . febuxostat  40 mg Oral Daily  . hydrALAZINE  25 mg Oral TID  . insulin aspart  0-5 Units Subcutaneous QHS  . insulin aspart  0-9 Units Subcutaneous TID WC  . insulin aspart  20 Units Subcutaneous TID WC  . insulin glargine  20 Units Subcutaneous QHS  . Linaclotide  290 mcg Oral Daily  . metoprolol tartrate  25 mg Oral BID  . potassium chloride SA  20 mEq Oral TID  . sodium chloride  3 mL Intravenous Q12H  . torsemide  20 mg Oral q1800  . torsemide  40 mg Oral Q breakfast  . vancomycin  1,000 mg Intravenous Q12H  . [START ON 06/22/2014] Vitamin D (Ergocalciferol)   50,000 Units Oral Q Mon    Infusions:    PRN Medications: sodium chloride, acetaminophen, hydrALAZINE, HYDROcodone-acetaminophen, levalbuterol, nitroGLYCERIN, ondansetron (ZOFRAN) IV, sodium chloride, zolpidem   Assessment:   1. Diarrhea  --C. Diff negative. ? abx associated  2. Recurrent junction bradycardia  3. Tachy-brady syndrome  - PPM (06/18/14) 4. A/c renal failure, stage IV  5. Chronic diastolic HF  6. Chronic atrial fibrillation -> now back in NSR   Plan/Discussion:    Stable overnight and went for PPM this morning (10/15). She remains in NSR in the 60-70s. Volume status stable. Will continue torsemide 40 mg q am and 20 mg qpm. K+ 3.5 will supplement K+.   Hopefully home tomorrow. OOB today  Length of Stay: 2 Rande Brunt NP-C 06/18/2014, 12:30 PM  Advanced Heart Failure Team Pager [phone] (M-F; [place])  Please contact [facility] for night-coverage after hours (4p -7a ) and weekends on [email or web address]  Patient seen and examined with [name], NP. We discussed all aspects of the encounter. I agree with the assessment and plan as stated above.   Doing well s/p PPM. Will restart AV nodal blockers. Volume status looks good. Supp K+  Benay Spice 7:48 PM

## 2014-06-18 NOTE — Progress Notes (Signed)
UR Completed Twylla Arceneaux Graves-Bigelow, RN,BSN [phone]  

## 2014-06-19 ENCOUNTER — Inpatient Hospital Stay (HOSPITAL_COMMUNITY): Payer: Medicare HMO

## 2014-06-19 DIAGNOSIS — N189 Chronic kidney disease, unspecified: Secondary | ICD-10-CM

## 2014-06-19 DIAGNOSIS — E1122 Type 2 diabetes mellitus with diabetic chronic kidney disease: Secondary | ICD-10-CM

## 2014-06-19 DIAGNOSIS — N184 Chronic kidney disease, stage 4 (severe): Secondary | ICD-10-CM

## 2014-06-19 LAB — BASIC METABOLIC PANEL
Anion gap: 15 (ref 5–15)
BUN: 25 mg/dL — ABNORMAL HIGH (ref 6–23)
CO2: 23 mEq/L (ref 19–32)
Calcium: 9.5 mg/dL (ref 8.4–10.5)
Chloride: 103 mEq/L (ref 96–112)
Creatinine, Ser: 2.45 mg/dL — ABNORMAL HIGH (ref 0.50–1.10)
GFR calc Af Amer: 22 mL/min — ABNORMAL LOW (ref >90)
GFR calc non Af Amer: 19 mL/min — ABNORMAL LOW (ref >90)
Glucose, Bld: 237 mg/dL — ABNORMAL HIGH (ref 70–99)
Potassium: 3.7 mEq/L (ref 3.7–5.3)
Sodium: 141 mEq/L (ref 137–147)

## 2014-06-19 LAB — GLUCOSE, CAPILLARY
Glucose-Capillary: 185 mg/dL — ABNORMAL HIGH (ref 70–99)
Glucose-Capillary: 196 mg/dL — ABNORMAL HIGH (ref 70–99)
Glucose-Capillary: 160 mg/dL — ABNORMAL HIGH (ref 70–99)
Glucose-Capillary: 162 mg/dL — ABNORMAL HIGH (ref 70–99)

## 2014-06-19 LAB — FECAL LACTOFERRIN, QUANT: Fecal Lactoferrin: POSITIVE

## 2014-06-19 NOTE — Discharge Instructions (Signed)
° ° °  Supplemental Discharge Instructions for  Pacemaker Patients  Activity No heavy lifting or vigorous activity with your left arm for 6 to 8 weeks.  Do not raise your left arm above your head for one week.  Gradually raise your affected arm as drawn below.             06/20/14                           06/22/14                 06/24/14                  10/24/15__  NO DRIVING for  1 week   ; you may begin driving on  [id number]   .  WOUND CARE   Keep the wound area clean and dry.  Do not get this area wet for one week. No showers for one week; you may shower on  06/29/14   .   The tape/steri-strips on your wound will fall off; do not pull them off.  No bandage is needed on the site.  DO  NOT apply any creams, oils, or ointments to the wound area.   If you notice any drainage or discharge from the wound, any swelling or bruising at the site, or you develop a fever > 101? F after you are discharged home, call the office at once.  Special Instructions   You are still able to use cellular telephones; use the ear opposite the side where you have your pacemaker/defibrillator.  Avoid carrying your cellular phone near your device.   When traveling through airports, show security personnel your identification card to avoid being screened in the metal detectors.  Ask the security personnel to use the hand wand.   Avoid arc welding equipment, MRI testing (magnetic resonance imaging), TENS units (transcutaneous nerve stimulators).  Call the office for questions about other devices.   Avoid electrical appliances that are in poor condition or are not properly grounded.   Microwave ovens are safe to be near or to operate.

## 2014-06-19 NOTE — Evaluation (Signed)
Clinical/Bedside Swallow Evaluation Patient Details  Name: [name] MRN: [id number] Date of Birth: 1946/06/08  Today's Date: 06/19/2014 Time: 0900-0914 SLP Time Calculation (min): 14 min  Past Medical History:  Past Medical History  Diagnosis Date  . Atrial flutter     ablated by Dr Lovena Le in 2008  . Hypertension   . Diabetes mellitus   . Diastolic heart failure     a. EF 60-65%, RV nl (03/2014)  . Asthma   . Hyperlipidemia   . Fatty liver   . Esophageal dysmotility   . Arthritis   . Sleep apnea     wears CPAP  . Fatty tumor fatty tumor back  . Coronary atherosclerosis of native coronary artery   . Morbid obesity   . Myocardial infarction 2009  . Heart murmur   . Peripheral vascular disease   . GERD (gastroesophageal reflux disease)     barrets esophagus  . Anginal pain     occ; non-ischemic Lexiscan 09/2012  . Kidney disease     CKD stage IV (Dr. Erling Cruz)  . Complication of anesthesia     " DIFFICULTY BREATHING "  . Persistent atrial fibrillation   . Tachycardia-bradycardia   . [name] esophagus   . Hiatal hernia   . Esophageal dysmotilities    Past Surgical History:  Past Surgical History  Procedure Laterality Date  . Coronary angioplasty with stent placement    . Breast lumpectomy      right  . Tubal ligation    . Tonsillectomy    . Total knee arthroplasty Right 12/15/2013    Procedure: RIGHT TOTAL KNEE ARTHROPLASTY;  Surgeon: Alta Corning, MD;  Location: [facility];  Service: Orthopedics;  Laterality: Right;  . Cardioversion N/A 03/03/2014    Procedure: CARDIOVERSION;  Surgeon: Laverda Page, MD;  Location: [place];  Service: Cardiovascular;  Laterality: N/A;  . Atrial flutter ablation  2008    CTI ablation by Dr Lovena Le  . Pacemaker insertion  06/18/14    STJ Assurity dual chamber pacemaker implanted by Dr Rayann Heman   HPI:  68 year old female with PMH of asthma, esophageal dysmotility, MIR, GERD, hiatal hernia, Barretts esophagus recently  admitted and discharged home with diagnosis of questionable PNA re-  admitted 10/13 with diarrhea, tachybradycardia syndrome, S/p pacemaker placement 10/15. Also an admission 7/15 due to aspiration PNA after elective DC-CV. Barium swallow complete 04/24/14: Severe nonspecific esophageal dysmotility with tertiary contractions/spasm. Marked esophageal stasis, requiring upright positioning for gravity drainage in the esophagus, Small hiatal hernia.   Assessment / Plan / Recommendation Clinical Impression  Patient presents with a suspected primary esophageal dysphagia given h/o hiatal hernia. GER, [name]'s esophagus with esophageal dysmotility and what appears to be a functional oropharyngeal swallow based on bedside exam. No overt s/s of aspiration noted. Both oral and pharyngeal phases of swallow appear WNL. Cannot r/o episodic post prandial aspiration of stomach contents given esophageal history to contribute to h/o recurrent PNAs. SLP provided education regarding general esophageal precautions which patient is using independently at this time. No further SLP needs indicated. Further esophageal w/u may be indicated if recurrent PNAs continue.     Aspiration Risk  Moderate    Diet Recommendation Regular;Thin liquid   Liquid Administration via: Cup;Straw Medication Administration: Whole meds with liquid Supervision: Patient able to self feed Compensations: Slow rate;Small sips/bites;Follow solids with liquid Postural Changes and/or Swallow Maneuvers: Seated upright 90 degrees;Upright 30-60 min after meal    Other  Recommendations Oral Care  Recommendations: Oral care BID   Follow Up Recommendations  None       Pertinent Vitals/Pain n/a     Swallow Study    General HPI: 68 year old female with PMH of asthma, esophageal dysmotility, MIR, GERD, hiatal hernia, Barretts esophagus recently admitted and discharged home with diagnosis of questionable PNA re-  admitted 10/13 with diarrhea,  tachybradycardia syndrome, S/p pacemaker placement 10/15. Also an admission 7/15 due to aspiration PNA after elective DC-CV. Barium swallow complete 04/24/14: Severe nonspecific esophageal dysmotility with tertiary contractions/spasm. Marked esophageal stasis, requiring upright positioning for gravity drainage in the esophagus, Small hiatal hernia. Type of Study: Bedside swallow evaluation Previous Swallow Assessment: see HPI Diet Prior to this Study: Regular;Thin liquids Temperature Spikes Noted: No Respiratory Status: Room air History of Recent Intubation: No Behavior/Cognition: Alert;Cooperative;Pleasant mood Oral Cavity - Dentition: Adequate natural dentition Self-Feeding Abilities: Able to feed self Patient Positioning: Upright in bed Baseline Vocal Quality: Clear Volitional Cough: Strong Volitional Swallow: Able to elicit    Oral/Motor/Sensory Function Overall Oral Motor/Sensory Function: Appears within functional limits for tasks assessed   Ice Chips Ice chips: Not tested   Thin Liquid Thin Liquid: Within functional limits Presentation: Cup;Self Fed;Straw    Nectar Thick Nectar Thick Liquid: Not tested   Honey Thick Honey Thick Liquid: Not tested   Puree Puree: Within functional limits Presentation: Self Fed;Spoon   Solid   GO   Adlee Paar MA, CCC-SLP [phone]  Solid: Within functional limits Presentation: Self Fed       Saoirse Legere Meryl 06/19/2014,9:38 AM

## 2014-06-19 NOTE — Progress Notes (Signed)
SUBJECTIVE: The patient is doing well today.  At this time, she denies chest pain, shortness of breath, or any new concerns.  Marland Kitchen amitriptyline  25 mg Oral QHS  . atorvastatin  40 mg Oral Daily  . dicyclomine  10 mg Oral Daily  . diltiazem  180 mg Oral Daily  . famotidine  10 mg Oral BID  . febuxostat  40 mg Oral Daily  . hydrALAZINE  25 mg Oral TID  . insulin aspart  0-5 Units Subcutaneous QHS  . insulin aspart  0-9 Units Subcutaneous TID WC  . insulin aspart  20 Units Subcutaneous TID WC  . insulin glargine  20 Units Subcutaneous QHS  . Linaclotide  290 mcg Oral Daily  . metoprolol tartrate  25 mg Oral BID  . potassium chloride SA  20 mEq Oral TID  . sodium chloride  3 mL Intravenous Q12H  . torsemide  20 mg Oral q1800  . torsemide  40 mg Oral Q breakfast  . [START ON 06/22/2014] Vitamin D (Ergocalciferol)  50,000 Units Oral Q Mon      OBJECTIVE: Physical Exam: Filed Vitals:   06/18/14 2250 06/19/14 0626 06/19/14 1033 06/19/14 1035  BP:  150/74 176/83   Pulse: 74 65  70  Temp:  98.2 F (36.8 C)    TempSrc:  Oral    Resp: 18     Height:      Weight:  221 lb 11.2 oz (100.562 kg)    SpO2: 98% 95%      Intake/Output Summary (Last 24 hours) at 06/19/14 1152 Last data filed at 06/19/14 1037  Gross per 24 hour  Intake    243 ml  Output   1000 ml  Net   -757 ml    Telemetry reveals sinus rhythm  GEN- The patient is well appearing, alert and oriented x 3 today.   Head- normocephalic, atraumatic Eyes-  Sclera clear, conjunctiva pink Ears- hearing intact Oropharynx- clear Neck- supple, no JVP Lymph- no cervical lymphadenopathy Lungs- Clear to ausculation bilaterally, normal work of breathing Heart- Regular rate and rhythm, no murmurs, rubs or gallops, PMI not laterally displaced GI- soft, NT, ND, + BS Extremities- no clubbing, cyanosis, or edema Skin- no rash or lesion Psych- euthymic mood, full affect Neuro- strength and sensation are intact  LABS: Basic  Metabolic Panel:  Recent Labs  06/16/14 1546  06/18/14 0500 06/19/14 0325  NA 142  < > 142 141  K 4.0  < > 3.5* 3.7  CL 105  < > 104 103  CO2 22  < > 23 23  GLUCOSE 195*  < > 170* 237*  BUN 30*  < > 26* 25*  CREATININE 2.86*  < > 2.51* 2.45*  CALCIUM 9.6  < > 9.2 9.5  MG 2.1  --   --   --   < > = values in this interval not displayed. Liver Function Tests:  Recent Labs  06/16/14 1546  AST 24  ALT 24  ALKPHOS 94  BILITOT 0.3  PROT 7.9  ALBUMIN 3.4*   No results found for this basename: LIPASE, AMYLASE,  in the last 72 hours CBC:  Recent Labs  06/16/14 1546  WBC 7.5  NEUTROABS 4.5  HGB 12.0  HCT 36.3  MCV 89.9  PLT 262   Thyroid Function Tests:  Recent Labs  06/16/14 2155  TSH 6.300*   Anemia Panel: No results found for this basename: VITAMINB12, FOLATE, FERRITIN, TIBC, IRON, RETICCTPCT,  in the  last 72 hours  RADIOLOGY: Dg Chest 2 View  06/19/2014   CLINICAL DATA:  Cardiac arrhythmia with pacemaker placement  EXAM: CHEST  2 VIEW  COMPARISON:  June 16, 2014  FINDINGS: There is no a pacemaker on the left. Lead tips are attached to the right atrium and right ventricle. There is no demonstrable pneumothorax. There is no edema or consolidation. The heart size and pulmonary vascularity are normal. No adenopathy. No bone lesions.  IMPRESSION: Pacemaker now present with lead tips attached to the right atrium and right ventricle. No edema or consolidation. No apparent pneumothorax.   Electronically Signed   By: Lowella Grip M.D.   On: 06/19/2014 08:03   Dg Chest 2 View  06/16/2014   CLINICAL DATA:  Midsternal chest pain with difficulty breathing  EXAM: CHEST  2 VIEW  COMPARISON:  June 11, 2014  FINDINGS: There is no edema or consolidation. Heart is upper normal in size with pulmonary vascularity within normal limits. No adenopathy. No bone lesions.  IMPRESSION: No edema or consolidation.   Electronically Signed   By: Lowella Grip M.D.   On: 06/16/2014  16:26   Dg Chest 2 View  06/11/2014   CLINICAL DATA:  Acute  on chronic dyspnea  EXAM: CHEST  2 VIEW  COMPARISON:  06/09/2014  FINDINGS: Cardiomediastinal silhouette is stable. Persistent residual mild interstitial prominence bilaterally without convincing pulmonary edema. No segmental infiltrate. Mild degenerative changes thoracic spine.  IMPRESSION: Persistent residual mild interstitial prominence bilaterally without convincing pulmonary edema. No segmental infiltrate.   Electronically Signed   By: Lahoma Crocker M.D.   On: 06/11/2014 15:16   Dg Chest Portable 1 View  06/09/2014   CLINICAL DATA:  Midchest pain, onset 1 day ago. Shortness of breath.  EXAM: PORTABLE CHEST - 1 VIEW  COMPARISON:  04/02/2014.  FINDINGS: Mediastinum and hilar structures normal. Cardiomegaly of mild pulmonary venous congestion. Bilateral pulmonary alveolar infiltrates noted. These changes may be related congestive heart failure with pulmonary edema. Pneumonia cannot be excluded. No pleural effusion or pneumothorax. No acute bony abnormality.  IMPRESSION: 1. Findings suggesting congestive heart failure with mild pulmonary edema.  2. Pneumonia cannot be excluded. Similar infiltrates are noted on prior study 04/02/2014. These infiltrates could be recurrent or persistent. Close follow-up chest x-rays to demonstrate clearing suggested.   Electronically Signed   By: Marcello Moores  Register   On: 06/09/2014 12:26    ASSESSMENT AND PLAN:  Active Problems:   GERD (gastroesophageal reflux disease)   Obstructive sleep apnea   Chronic kidney disease (CKD), stage IV (severe)   DM (diabetes mellitus), type 2 with renal complications   Chronic diastolic heart failure   Hypertension   Atrial fibrillation   Junctional (nodal) bradycardia   Acute on chronic respiratory failure with hypoxia   Diarrhea   Abnormal TSH  1. Atrial fibrillation Now maintaining sinus rhythm Consider restart amiodarone 200mg  daily, I will defer to Dr Haroldine Laws CrCl  is 35... Restart eliquis tomorrow.  If renal function worsens any, she would be better served to switch to coumadin.  2. Tachy/brady Doing well s/p PPM Ok to resume rate controlling agents cxr is reviewed Device interrogation is reviewed  Routine wound care and follow-up in the device clinic in 10 days  Electrophysiology team to see as needed while here. Please call with questions.    [name], MD 06/19/2014 11:52 AM

## 2014-06-19 NOTE — Progress Notes (Signed)
Advanced Heart Failure Rounding Note   Subjective:    PPM (10/15). Doing well. Denies SOB, orthopnea or CP.   Objective:   Weight Range:  Vital Signs:   Temp:  [98.2 F (36.8 C)-99.2 F (37.3 C)] 99.2 F (37.3 C) (10/16 1348) Pulse Rate:  [60-74] 60 (10/16 1348) Resp:  [18] 18 (10/16 1348) BP: (139-176)/(74-83) 139/77 mmHg (10/16 1348) SpO2:  [95 %-99 %] 98 % (10/16 1348) Weight:  [221 lb 11.2 oz (100.562 kg)] 221 lb 11.2 oz (100.562 kg) (10/16 0626) Last BM Date: 06/18/14  Weight change: Filed Weights   06/16/14 2100 06/18/14 0518 06/19/14 0626  Weight: 224 lb 10.4 oz (101.9 kg) 220 lb 1.6 oz (99.837 kg) 221 lb 11.2 oz (100.562 kg)    Intake/Output:   Intake/Output Summary (Last 24 hours) at 06/19/14 1522 Last data filed at 06/19/14 1351  Gross per 24 hour  Intake    603 ml  Output   2525 ml  Net  -1922 ml    Physical Exam:  General: Obese, NAD,  HEENT: normal  Neck: supple. JVP difficult to assess d/t body habitus but does not appear elevated; Carotids 2+ bilat; no bruits. No lymphadenopathy or thryomegaly appreciate  Cor: PMI nonpalpable. Regular rate and rhythm. Pacer dressing in place. L arm in sling.  Lungs: Diminished in the bases  Abdomen: Obese soft, nontender. Non-distended. No hepatosplenomegaly. No bruits or masses. Good bowel sounds.  Extremities: no cyanosis, clubbing, rash, warm. No edema.  Neuro: alert & orientedx3, cranial nerves grossly intact. moves all 4 extremities w/o difficulty. Affect pleasant    Labs: Basic Metabolic Panel:  Recent Labs Lab 06/16/14 1546 06/17/14 0855 06/18/14 0500 06/19/14 0325  NA 142 146 142 141  K 4.0 3.5* 3.5* 3.7  CL 105 106 104 103  CO2 22 25 23 23   GLUCOSE 195* 149* 170* 237*  BUN 30* 27* 26* 25*  CREATININE 2.86* 2.57* 2.51* 2.45*  CALCIUM 9.6 9.4 9.2 9.5  MG 2.1  --   --   --     Liver Function Tests:  Recent Labs Lab 06/16/14 1546  AST 24  ALT 24  ALKPHOS 94  BILITOT 0.3  PROT 7.9   ALBUMIN 3.4*   No results found for this basename: LIPASE, AMYLASE,  in the last 168 hours No results found for this basename: AMMONIA,  in the last 168 hours  CBC:  Recent Labs Lab 06/16/14 1546  WBC 7.5  NEUTROABS 4.5  HGB 12.0  HCT 36.3  MCV 89.9  PLT 262    Cardiac Enzymes:  Recent Labs Lab 06/16/14 2117 06/17/14 0257 06/17/14 0855  TROPONINI <0.30 <0.30 <0.30    BNP: BNP (last 3 results)  Recent Labs  03/03/14 1851 05/25/14 1808 06/09/14 1105  PROBNP 1531.0* 2123.0* 2594.0*     Other results:    Imaging: Dg Chest 2 View  06/19/2014   CLINICAL DATA:  Cardiac arrhythmia with pacemaker placement  EXAM: CHEST  2 VIEW  COMPARISON:  June 16, 2014  FINDINGS: There is no a pacemaker on the left. Lead tips are attached to the right atrium and right ventricle. There is no demonstrable pneumothorax. There is no edema or consolidation. The heart size and pulmonary vascularity are normal. No adenopathy. No bone lesions.  IMPRESSION: Pacemaker now present with lead tips attached to the right atrium and right ventricle. No edema or consolidation. No apparent pneumothorax.   Electronically Signed   By: Lowella Grip M.D.   On: 06/19/2014  08:03     Medications:     Scheduled Medications: . amitriptyline  25 mg Oral QHS  . atorvastatin  40 mg Oral Daily  . dicyclomine  10 mg Oral Daily  . diltiazem  180 mg Oral Daily  . famotidine  10 mg Oral BID  . febuxostat  40 mg Oral Daily  . hydrALAZINE  25 mg Oral TID  . insulin aspart  0-5 Units Subcutaneous QHS  . insulin aspart  0-9 Units Subcutaneous TID WC  . insulin aspart  20 Units Subcutaneous TID WC  . insulin glargine  20 Units Subcutaneous QHS  . Linaclotide  290 mcg Oral Daily  . metoprolol tartrate  25 mg Oral BID  . potassium chloride SA  20 mEq Oral TID  . sodium chloride  3 mL Intravenous Q12H  . torsemide  20 mg Oral q1800  . torsemide  40 mg Oral Q breakfast  . [START ON 06/22/2014] Vitamin  D (Ergocalciferol)  50,000 Units Oral Q Mon    Infusions:    PRN Medications: sodium chloride, acetaminophen, hydrALAZINE, HYDROcodone-acetaminophen, levalbuterol, nitroGLYCERIN, ondansetron (ZOFRAN) IV, sodium chloride, zolpidem   Assessment:   1. Diarrhea  --C. Diff negative. ? abx associated  2. Recurrent junction bradycardia  3. Tachy-brady syndrome  - PPM (06/18/14) 4. A/c renal failure, stage IV  5. Chronic diastolic HF  6. Chronic atrial fibrillation -> now back in NSR   Plan/Discussion:    S/o PPM (10/15) for tachy-brady. Stable overnight and remains in NSR.   Volume status appears at baseline. Will continue torsemide 40 mg q am and 20 mg q pm. Renal function stable. Will need to continue to follow if CrCl worsens need to consider switching to coumadin from eliquis (Will defer to Dr. Haroldine Laws). Start Eliquis tomorrow if ok.   Will restart amiodarone 200 mg daily.  Hopefully home tomorrow.   Length of Stay: 3 Rande Brunt NP-C 06/19/2014, 3:22 PM  Advanced Heart Failure Team Pager [phone] (M-F; 7a - 4p)  Please contact [facility] for night-coverage after hours (4p -7a ) and weekends on [email or web address]  Patient seen and examined with [name], NP. We discussed all aspects of the encounter. I agree with the assessment and plan as stated above.   Doing well. Suprisingly she is still in NSR. Previously felt to have failed amio. Agree with Dr. Rayann Heman that it is reasonable to restart amio at 200 daily. Will titrate AVN blockers back toward baseline. Volume status looks good.   Resume Eliquis at 5 bid tomorrow am. Can send home tomorrow. Will f/u in HF Clinic.   Cardiac  Meds for d/c  Amio 200 daily  Apixaban 5 bid Diltiazem 180 daily Metoprolol 50 bid Hydralazine 25 tid Torsemide 40/20 Atorva 40 daily  Kcl 20 bid  Marji Kuehnel,MD 9:23 PM

## 2014-06-19 NOTE — Progress Notes (Signed)
Progress Note  [name] [id number] DOB: 1945/12/23 DOA: 06/16/2014 PCP: [name], MD   Brief narrative: 68 year old female patient with underlying atrial fibrillation as well as diabetes and stage IV chronic kidney disease who was hospitalized late September after experiencing symptomatic junctional rhythm while utilizing amiodarone, metoprolol and cardizem.  Medication adjustments were made and she improved to sinus rhythm then back into atrial fibrillation.  She returned to the hospital, was admitted for questionable pneumonia, and discharged home on Levaquin 10/9, as well as Lopressor 75 twice a day and diltiazem 360 daily.   On the date of this admission her home health nurse was concerned about ongoing diarrhea for 4 days and pulses in the 40s. The cardiology office was consulted and the patient was instructed to present to the emergency department.  In the ER she was noted to have recurrent junctional bradycardia rates in the 40s but apparently spontaneously back into sinus rhythm. Her AV nodal blocking agents were held by the admitting physician. In regards to her diarrhea she was not having any abdominal pain. The stools were nonbloody. During the previous admission was felt the patient may have had a pneumonia process so she was discharged home on Levaquin.  HPI/Subjective: cheerful. No chest pain or shortness of breath.  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 S/p . PPM. Rate better. On cardizem. Plan to start eliquis if renal function remains at baseline.   Abnormal TSH TSH 6.3 - normal free t4.   Diarrhea C. difficile PCR negative  DMtype 2 with renal complications CBG (last 3)   Recent Labs  06/18/14 2214 06/19/14 0807 06/19/14 1149  GLUCAP 169* 185* 196*    Suboptimal, resume long acting and SSI.   Acute on chronic respiratory failure with hypoxia:   A)  Obstructive sleep apnea   B) Chronic diastolic heart failure Currently well compensated - patient denies noncompliance with CPAP at home - continue home diuretics  GERD  Continue Pepcid  Chronic kidney disease stage IV (severe) Current renal function remains stable - baseline crt appears to be ~2.3-2.5  Hypertension Blood pressure well controlled-   DVT prophylaxis: Eliquis Code Status: Full Family Communication: No family at bedside Disposition Plan/Expected LOS: possible d/c in 1 to 2 days.  Consultants: Cardiology Electrophysiology  Procedures: PPM placement.  Venography LUE Cultures: None  Antibiotics: One dose of vancomycin.   Objective: Blood pressure 139/77, pulse 60, temperature 99.2 F (37.3 C), temperature source Oral, resp. rate 18, height 5\' 4"  (1.626 m), weight 100.562 kg (221 lb 11.2 oz), SpO2 98.00%.  Intake/Output Summary (Last 24 hours) at 06/19/14 1544 Last data filed at 06/19/14 1351  Gross per 24 hour  Intake    603 ml  Output   2525 ml  Net  -1922 ml   Exam: Gen: No acute respiratory distress Chest: Clear to auscultation bilaterally without wheezes, rhonchi or crackles, 2 L Cardiac: Regular occasionally bradycardic rate and rhythm, S1-S2, no rubs murmurs or gallops, no peripheral edema, no JVD Abdomen: Soft nontender nondistended without obvious hepatosplenomegaly, no ascites Extremities: Symmetrical in appearance without cyanosis, clubbing or effusion  Scheduled Meds:  Scheduled Meds: . amitriptyline  25 mg Oral QHS  . atorvastatin  40 mg Oral Daily  . dicyclomine  10 mg Oral Daily  . diltiazem  180 mg Oral Daily  . famotidine  10 mg Oral BID  . febuxostat  40 mg Oral Daily  . hydrALAZINE  25 mg Oral TID  . insulin aspart  0-5 Units Subcutaneous QHS  . insulin aspart  0-9 Units Subcutaneous TID WC  . insulin aspart  20 Units Subcutaneous TID WC  . insulin glargine  20 Units Subcutaneous QHS  . Linaclotide  290 mcg Oral Daily  .  metoprolol tartrate  25 mg Oral BID  . potassium chloride SA  20 mEq Oral TID  . sodium chloride  3 mL Intravenous Q12H  . torsemide  20 mg Oral q1800  . torsemide  40 mg Oral Q breakfast  . [START ON 06/22/2014] Vitamin D (Ergocalciferol)  50,000 Units Oral Q Mon   Data Reviewed: Basic Metabolic Panel:  Recent Labs Lab 06/16/14 1546 06/17/14 0855 06/18/14 0500 06/19/14 0325  NA 142 146 142 141  K 4.0 3.5* 3.5* 3.7  CL 105 106 104 103  CO2 22 25 23 23   GLUCOSE 195* 149* 170* 237*  BUN 30* 27* 26* 25*  CREATININE 2.86* 2.57* 2.51* 2.45*  CALCIUM 9.6 9.4 9.2 9.5  MG 2.1  --   --   --    Liver Function Tests:  Recent Labs Lab 06/16/14 1546  AST 24  ALT 24  ALKPHOS 94  BILITOT 0.3  PROT 7.9  ALBUMIN 3.4*   CBC:  Recent Labs Lab 06/16/14 1546  WBC 7.5  NEUTROABS 4.5  HGB 12.0  HCT 36.3  MCV 89.9  PLT 262   Cardiac Enzymes:  Recent Labs Lab 06/16/14 2117 06/17/14 0257 06/17/14 0855  TROPONINI <0.30 <0.30 <0.30   BNP (last 3 results)  Recent Labs  03/03/14 1851 05/25/14 1808 06/09/14 1105  PROBNP 1531.0* 2123.0* 2594.0*   CBG:  Recent Labs Lab 06/18/14 1120 06/18/14 1639 06/18/14 2214 06/19/14 0807 06/19/14 1149  GLUCAP 190* 271* 169* 185* 196*    Recent Results (from the past 240 hour(s))  CLOSTRIDIUM DIFFICILE BY PCR     Status: None   Collection Time    06/16/14  5:27 PM      Result Value Ref Range Status   C difficile by pcr NEGATIVE  NEGATIVE Final  MRSA PCR SCREENING     Status: None   Collection Time    06/16/14 10:44 PM      Result Value Ref Range Status   MRSA by PCR NEGATIVE  NEGATIVE Final   Comment:            The GeneXpert MRSA Assay (FDA     approved for NASAL specimens     only), is one component of a     comprehensive MRSA colonization     surveillance program. It is not     intended to diagnose MRSA     infection nor to guide or     monitor treatment for     MRSA infections.       Time spent :  35  mins  Hosie Poisson, MD  [facility] Office  [phone] Pager [phone] On-Call/Text Page:      [email or web address]      password [email or web address]  If 7PM-7AM, please contact night-coverage [email or web address] Password [email or web address] 06/19/2014, 3:44 PM   LOS: 3 days

## 2014-06-19 NOTE — Progress Notes (Signed)
RT Note: Pt states she can place herself on CPAP when ready. H2o chamber full. I told her to call if she needs further assistance.

## 2014-06-20 LAB — BASIC METABOLIC PANEL
Anion gap: 13 (ref 5–15)
BUN: 26 mg/dL — ABNORMAL HIGH (ref 6–23)
CO2: 27 mEq/L (ref 19–32)
Calcium: 9.8 mg/dL (ref 8.4–10.5)
Chloride: 99 mEq/L (ref 96–112)
Creatinine, Ser: 2.35 mg/dL — ABNORMAL HIGH (ref 0.50–1.10)
GFR calc Af Amer: 23 mL/min — ABNORMAL LOW (ref >90)
GFR calc non Af Amer: 20 mL/min — ABNORMAL LOW (ref >90)
Glucose, Bld: 218 mg/dL — ABNORMAL HIGH (ref 70–99)
Potassium: 3.7 mEq/L (ref 3.7–5.3)
Sodium: 139 mEq/L (ref 137–147)

## 2014-06-20 LAB — GLUCOSE, CAPILLARY
Glucose-Capillary: 146 mg/dL — ABNORMAL HIGH (ref 70–99)
Glucose-Capillary: 225 mg/dL — ABNORMAL HIGH (ref 70–99)
Glucose-Capillary: 171 mg/dL — ABNORMAL HIGH (ref 70–99)

## 2014-06-20 MED ORDER — AMIODARONE HCL 200 MG PO TABS: 200.0000 mg | ORAL_TABLET | Freq: Every day | ORAL | Status: DC

## 2014-06-20 MED ORDER — APIXABAN 5 MG PO TABS
5.0000 mg | ORAL_TABLET | Freq: Two times a day (BID) | ORAL | Status: DC
  Administered 2014-06-20: 5 mg via ORAL
  Filled 2014-06-20: qty 1

## 2014-06-20 MED ORDER — DILTIAZEM HCL ER COATED BEADS 180 MG PO CP24: 180.0000 mg | ORAL_CAPSULE | Freq: Every day | ORAL | Status: DC

## 2014-06-20 MED ORDER — AMIODARONE HCL 200 MG PO TABS
200.0000 mg | ORAL_TABLET | Freq: Every day | ORAL | Status: DC
  Administered 2014-06-20: 200 mg via ORAL
  Filled 2014-06-20: qty 1

## 2014-06-20 MED ORDER — INSULIN GLARGINE 100 UNIT/ML ~~LOC~~ SOLN: 20.0000 [IU] | Freq: Every day | SUBCUTANEOUS | Status: DC

## 2014-06-20 NOTE — Discharge Summary (Signed)
Physician Discharge Summary  [name] [id number] DOB: 11-Mar-1946 DOA: 06/16/2014  PCP: [name], MD  Admit date: 06/16/2014 Discharge date: 06/20/2014  Time spent: 30 minutes  Recommendations for Outpatient Follow-up:  1. Follow up with heart failure clinic ini one week.   Discharge Diagnoses:  Active Problems:   GERD (gastroesophageal reflux disease)   Obstructive sleep apnea   Chronic kidney disease (CKD), stage IV (severe)   DM (diabetes mellitus), type 2 with renal complications   Chronic diastolic heart failure   Hypertension   Atrial fibrillation   Junctional (nodal) bradycardia   Acute on chronic respiratory failure with hypoxia   Diarrhea   Abnormal TSH   Discharge Condition: improved.   Diet recommendation: low sodium diet  Filed Weights   06/18/14 0518 06/19/14 0626 06/20/14 0432  Weight: 99.837 kg (220 lb 1.6 oz) 100.562 kg (221 lb 11.2 oz) 101.197 kg (223 lb 1.6 oz)    History of present illness: 68 year old female patient with underlying atrial fibrillation as well as diabetes and stage IV chronic kidney disease who was hospitalized late September after experiencing symptomatic junctional rhythm while utilizing amiodarone, metoprolol and cardizem. Medication adjustments were made and she improved to sinus rhythm then back into atrial fibrillation. She returned to the hospital, was admitted for questionable pneumonia, and discharged home on Levaquin 10/9, as well as Lopressor 75 twice a day and diltiazem 360 daily.  On the date of this admission her home health nurse was concerned about ongoing diarrhea for 4 days and pulses in the 40s. The cardiology office was consulted and the patient was instructed to present to the emergency department. In the ER she was noted to have recurrent junctional bradycardia rates in the 40s but apparently spontaneously back into sinus rhythm. Her AV nodal blocking agents were held by the admitting physician. EP consulted  and she underwent PPM. She was restarted on cardizem and amiodarone by the cardiology and she was discharged home.    Hospital Course:  Parox Atrial fibrillation / Junctional (nodal) bradycardia  Appreciate cardiology and Dr Rayann Heman assistance. S/p . PPM. Rate better. On cardizem. eliquis and amiodarone started and discharged to follow upwith heart failure clinic in on week.  Abnormal TSH  TSH 6.3 - normal free t4.  Diarrhea  C. difficile PCR negative  DMtype 2 with renal complications  Resume home meds.  Acute on chronic respiratory failure with hypoxia:  A) Obstructive sleep apnea  B) Chronic diastolic heart failure  Currently well compensated - patient denies noncompliance with CPAP at home - continue home diuretics  GERD  Continue Pepcid  Chronic kidney disease stage IV (severe)  Current renal function remains stable - baseline crt appears to be ~2.3-2.5  Hypertension  Blood pressure well controlled-      Procedures:  Ppm placement.  Consultations:  Cardiology  EP  Discharge Exam: Filed Vitals:   06/20/14 1346  BP: 151/74  Pulse: 97  Temp: 98.4 F (36.9 C)  Resp: 18    General: alerta febrile comfortable Cardiovascular: s1s2 Respiratory: ctab  Discharge Instructions You were cared for by a hospitalist during your hospital stay. If you have any questions about your discharge medications or the care you received while you were in the hospital after you are discharged, you can call the unit and asked to speak with the hospitalist on call if the hospitalist that took care of you is not available. Once you are discharged, your primary care physician will handle any further medical issues. Please  note that NO REFILLS for any discharge medications will be authorized once you are discharged, as it is imperative that you return to your primary care physician (or establish a relationship with a primary care physician if you do not have one) for your aftercare needs so that  they can reassess your need for medications and monitor your lab values.  Discharge Instructions   Diet - low sodium heart healthy    Complete by:  As directed      Diet - low sodium heart healthy    Complete by:  As directed      Discharge instructions    Complete by:  As directed   Follow up with heart failure clinic as recommended.     Discharge instructions    Complete by:  As directed   Follow up with heart failure clinic as recommended in one week.  Follow up with Dr Rayann Heman as recommended.          Current Discharge Medication List    START taking these medications   Details  amiodarone (PACERONE) 200 MG tablet Take 1 tablet (200 mg total) by mouth daily. Qty: 30 tablet, Refills: 1      CONTINUE these medications which have CHANGED   Details  diltiazem (CARDIZEM CD) 180 MG 24 hr capsule Take 1 capsule (180 mg total) by mouth daily. Qty: 30 capsule, Refills: 1    insulin glargine (LANTUS) 100 UNIT/ML injection Inject 0.2 mLs (20 Units total) into the skin at bedtime. Qty: 10 mL, Refills: 2      CONTINUE these medications which have NOT CHANGED   Details  acetaminophen (TYLENOL) 500 MG tablet Take 500 mg by mouth every 6 (six) hours as needed for mild pain.    amitriptyline (ELAVIL) 25 MG tablet Take 25 mg by mouth at bedtime.      apixaban (ELIQUIS) 5 MG TABS tablet Take 1 tablet (5 mg total) by mouth 2 (two) times daily. Qty: 60 tablet, Refills: 2   Associated Diagnoses: Congestive heart failure, unspecified    atorvastatin (LIPITOR) 40 MG tablet Take 1 tablet (40 mg total) by mouth daily. Qty: 90 tablet, Refills: 3    dicyclomine (BENTYL) 10 MG capsule Take 10 mg by mouth daily.     esomeprazole (NEXIUM) 40 MG capsule Take 1 capsule (40 mg total) by mouth daily at 12 noon. Qty: 30 capsule, Refills: 0   Associated Diagnoses: Congestive heart failure, unspecified    febuxostat (ULORIC) 40 MG tablet Take 40 mg by mouth daily.     hydrALAZINE (APRESOLINE) 25  MG tablet Take 1 tablet (25 mg total) by mouth 3 (three) times daily. Qty: 90 tablet, Refills: 3    insulin lispro (HUMALOG) 100 UNIT/ML injection Inject 0.2 mLs (20 Units total) into the skin 3 (three) times daily after meals. Qty: 10 mL, Refills: 11    Linaclotide (LINZESS) 145 MCG CAPS capsule Take 290 mcg by mouth daily.    metoprolol tartrate (LOPRESSOR) 25 MG tablet Take 3 tablets (75 mg total) by mouth 2 (two) times daily. Qty: 180 tablet, Refills: 3    potassium chloride SA (K-DUR,KLOR-CON) 20 MEQ tablet Take 1 tablet (20 mEq total) by mouth 2 (two) times daily. Qty: 75 tablet, Refills: 6   Associated Diagnoses: Chronic diastolic heart failure    ranitidine (ZANTAC) 150 MG tablet Take 1 tablet (150 mg total) by mouth at bedtime. Qty: 30 tablet, Refills: 0   Associated Diagnoses: Congestive heart failure, unspecified  torsemide (DEMADEX) 20 MG tablet Take 1-2 tablets (20-40 mg total) by mouth 2 (two) times daily. Take 40 mg a.m., 20 mg p.m. Qty: 90 tablet, Refills: 3    Vitamin D, Ergocalciferol, (DRISDOL) 50000 UNITS CAPS capsule Take 50,000 Units by mouth every Monday.    XOPENEX HFA 45 MCG/ACT inhaler Inhale 2 puffs into the lungs every 4 (four) hours as needed for wheezing or shortness of breath. Qty: 1 Inhaler, Refills: 1    zolpidem (AMBIEN) 10 MG tablet Take 10 mg by mouth at bedtime.     nitroGLYCERIN (NITROSTAT) 0.4 MG SL tablet Place 0.4 mg under the tongue every 5 (five) minutes as needed for chest pain.       STOP taking these medications     levofloxacin (LEVAQUIN) 500 MG tablet        Allergies  Allergen Reactions  . Adhesive [Tape] Itching    EKG leads  . Sulfa Antibiotics Itching and Nausea And Vomiting    "everything I seen was red"  . Codeine Nausea And Vomiting  . Penicillins Nausea And Vomiting   Follow-up Information   Follow up with [name], MD On 07/01/2014. (at 11:30 AM for device clinic)    Specialty:  Cardiology   Contact  information:   [place] [address] [phone]       Follow up with [name], MD On 07/01/2014.   Specialty:  Cardiology   Contact information:   [address] [place] [address] [phone]       Follow up with [name], MD. Schedule an appointment as soon as possible for a visit in 1 week.   Specialty:  Cardiology   Contact information:   [address] [place] [address] [phone]        The results of significant diagnostics from this hospitalization (including imaging, microbiology, ancillary and laboratory) are listed below for reference.    Significant Diagnostic Studies: Dg Chest 2 View  06/19/2014   CLINICAL DATA:  Cardiac arrhythmia with pacemaker placement  EXAM: CHEST  2 VIEW  COMPARISON:  June 16, 2014  FINDINGS: There is no a pacemaker on the left. Lead tips are attached to the right atrium and right ventricle. There is no demonstrable pneumothorax. There is no edema or consolidation. The heart size and pulmonary vascularity are normal. No adenopathy. No bone lesions.  IMPRESSION: Pacemaker now present with lead tips attached to the right atrium and right ventricle. No edema or consolidation. No apparent pneumothorax.   Electronically Signed   By: Lowella Grip M.D.   On: 06/19/2014 08:03   Dg Chest 2 View  06/16/2014   CLINICAL DATA:  Midsternal chest pain with difficulty breathing  EXAM: CHEST  2 VIEW  COMPARISON:  June 11, 2014  FINDINGS: There is no edema or consolidation. Heart is upper normal in size with pulmonary vascularity within normal limits. No adenopathy. No bone lesions.  IMPRESSION: No edema or consolidation.   Electronically Signed   By: Lowella Grip M.D.   On: 06/16/2014 16:26   Dg Chest 2 View  06/11/2014   CLINICAL DATA:  Acute  on chronic dyspnea  EXAM: CHEST  2 VIEW  COMPARISON:  06/09/2014  FINDINGS: Cardiomediastinal silhouette is stable. Persistent  residual mild interstitial prominence bilaterally without convincing pulmonary edema. No segmental infiltrate. Mild degenerative changes thoracic spine.  IMPRESSION: Persistent residual mild interstitial prominence bilaterally without convincing pulmonary edema. No segmental  infiltrate.   Electronically Signed   By: Lahoma Crocker M.D.   On: 06/11/2014 15:16   Dg Chest Portable 1 View  06/09/2014   CLINICAL DATA:  Midchest pain, onset 1 day ago. Shortness of breath.  EXAM: PORTABLE CHEST - 1 VIEW  COMPARISON:  04/02/2014.  FINDINGS: Mediastinum and hilar structures normal. Cardiomegaly of mild pulmonary venous congestion. Bilateral pulmonary alveolar infiltrates noted. These changes may be related congestive heart failure with pulmonary edema. Pneumonia cannot be excluded. No pleural effusion or pneumothorax. No acute bony abnormality.  IMPRESSION: 1. Findings suggesting congestive heart failure with mild pulmonary edema.  2. Pneumonia cannot be excluded. Similar infiltrates are noted on prior study 04/02/2014. These infiltrates could be recurrent or persistent. Close follow-up chest x-rays to demonstrate clearing suggested.   Electronically Signed   By: Marcello Moores  Register   On: 06/09/2014 12:26    Microbiology: Recent Results (from the past 240 hour(s))  CLOSTRIDIUM DIFFICILE BY PCR     Status: None   Collection Time    06/16/14  5:27 PM      Result Value Ref Range Status   C difficile by pcr NEGATIVE  NEGATIVE Final  MRSA PCR SCREENING     Status: None   Collection Time    06/16/14 10:44 PM      Result Value Ref Range Status   MRSA by PCR NEGATIVE  NEGATIVE Final   Comment:            The GeneXpert MRSA Assay (FDA     approved for NASAL specimens     only), is one component of a     comprehensive MRSA colonization     surveillance program. It is not     intended to diagnose MRSA     infection nor to guide or     monitor treatment for     MRSA infections.  STOOL CULTURE     Status: None    Collection Time    06/18/14  3:51 PM      Result Value Ref Range Status   Specimen Description STOOL   Final   Special Requests NONE   Final   Culture     Final   Value: NO SUSPICIOUS COLONIES, CONTINUING TO HOLD     Note: REDUCED NORMAL FLORA PRESENT     Performed at [facility]   Report Status PENDING   Incomplete     Labs: Basic Metabolic Panel:  Recent Labs Lab 06/16/14 1546 06/17/14 0855 06/18/14 0500 06/19/14 0325 06/20/14 1125  NA 142 146 142 141 139  K 4.0 3.5* 3.5* 3.7 3.7  CL 105 106 104 103 99  CO2 22 25 23 23 27   GLUCOSE 195* 149* 170* 237* 218*  BUN 30* 27* 26* 25* 26*  CREATININE 2.86* 2.57* 2.51* 2.45* 2.35*  CALCIUM 9.6 9.4 9.2 9.5 9.8  MG 2.1  --   --   --   --    Liver Function Tests:  Recent Labs Lab 06/16/14 1546  AST 24  ALT 24  ALKPHOS 94  BILITOT 0.3  PROT 7.9  ALBUMIN 3.4*   No results found for this basename: LIPASE, AMYLASE,  in the last 168 hours No results found for this basename: AMMONIA,  in the last 168 hours CBC:  Recent Labs Lab 06/16/14 1546  WBC 7.5  NEUTROABS 4.5  HGB 12.0  HCT 36.3  MCV 89.9  PLT 262   Cardiac Enzymes:  Recent Labs Lab 06/16/14 2117  06/17/14 0257 06/17/14 0855  TROPONINI <0.30 <0.30 <0.30   BNP: BNP (last 3 results)  Recent Labs  03/03/14 1851 05/25/14 1808 06/09/14 1105  PROBNP 1531.0* 2123.0* 2594.0*   CBG:  Recent Labs Lab 06/19/14 1149 06/19/14 1701 06/19/14 2057 06/20/14 0729 06/20/14 1100  GLUCAP 196* 160* 162* 146* 225*       Signed:  Claron Rosencrans  [facility] 06/20/2014, 4:17 PM

## 2014-06-22 LAB — STOOL CULTURE

## 2014-06-26 ENCOUNTER — Ambulatory Visit (INDEPENDENT_AMBULATORY_CARE_PROVIDER_SITE_OTHER): Payer: Commercial Managed Care - HMO | Admitting: Ophthalmology

## 2014-06-29 ENCOUNTER — Encounter (HOSPITAL_COMMUNITY): Payer: Self-pay

## 2014-06-29 ENCOUNTER — Ambulatory Visit (HOSPITAL_COMMUNITY)
Admission: RE | Admit: 2014-06-29 | Discharge: 2014-06-29 | Disposition: A | Discharge status: Home / Self Care | Payer: Medicare HMO | Source: Ambulatory Visit | Attending: Internal Medicine | Admitting: Internal Medicine

## 2014-06-29 VITALS — BP 121/72 | HR 59 | Resp 18 | Wt 230.2 lb

## 2014-06-29 DIAGNOSIS — Z794 Long term (current) use of insulin: Secondary | ICD-10-CM | POA: Diagnosis not present

## 2014-06-29 DIAGNOSIS — I4891 Unspecified atrial fibrillation: Secondary | ICD-10-CM | POA: Insufficient documentation

## 2014-06-29 DIAGNOSIS — I129 Hypertensive chronic kidney disease with stage 1 through stage 4 chronic kidney disease, or unspecified chronic kidney disease: Secondary | ICD-10-CM | POA: Diagnosis not present

## 2014-06-29 DIAGNOSIS — E785 Hyperlipidemia, unspecified: Secondary | ICD-10-CM | POA: Diagnosis not present

## 2014-06-29 DIAGNOSIS — I2510 Atherosclerotic heart disease of native coronary artery without angina pectoris: Secondary | ICD-10-CM | POA: Diagnosis not present

## 2014-06-29 DIAGNOSIS — I495 Sick sinus syndrome: Secondary | ICD-10-CM

## 2014-06-29 DIAGNOSIS — I252 Old myocardial infarction: Secondary | ICD-10-CM | POA: Insufficient documentation

## 2014-06-29 DIAGNOSIS — I4892 Unspecified atrial flutter: Secondary | ICD-10-CM | POA: Diagnosis not present

## 2014-06-29 DIAGNOSIS — E119 Type 2 diabetes mellitus without complications: Secondary | ICD-10-CM | POA: Insufficient documentation

## 2014-06-29 DIAGNOSIS — Z7901 Long term (current) use of anticoagulants: Secondary | ICD-10-CM | POA: Insufficient documentation

## 2014-06-29 DIAGNOSIS — Z87891 Personal history of nicotine dependence: Secondary | ICD-10-CM | POA: Insufficient documentation

## 2014-06-29 DIAGNOSIS — J45909 Unspecified asthma, uncomplicated: Secondary | ICD-10-CM | POA: Insufficient documentation

## 2014-06-29 DIAGNOSIS — N184 Chronic kidney disease, stage 4 (severe): Secondary | ICD-10-CM | POA: Insufficient documentation

## 2014-06-29 DIAGNOSIS — I10 Essential (primary) hypertension: Secondary | ICD-10-CM

## 2014-06-29 DIAGNOSIS — I480 Paroxysmal atrial fibrillation: Secondary | ICD-10-CM

## 2014-06-29 DIAGNOSIS — I5032 Chronic diastolic (congestive) heart failure: Secondary | ICD-10-CM

## 2014-06-29 NOTE — Progress Notes (Signed)
Patient ID: [name], female   DOB: [birth year], 68 y.o.   MRN: [id number]  Primary Cardiologist: Dr. Einar Gip PCP: Dr. [name] Nephrologist: Dr. Florene Glen EP: Dr Rayann Heman   HPI: [name] is a 68 year old woman with hypertension, hyperlipidemia, bronchial asthma, diabetes mellitus, CKD stage IV (baseline cr ~2.5), atrial fibrillation and diastolic HF.  She was admitted in 4/15 for R TKR. That hospitalization complicated by a/c diastolic HF with respiratory distress requiring non-rebreather support. Weight on discharge was 226 pounds. ABG at that time 7.4/34/91/97%. Cr peaked at 2.9  Admitted 6/30-7/10/15 for SOB and CP following scheduled cardioversion. Found to have severe aspiration PNA and was intubated. She maintained SR for short period of time and then went back into Afib/Aflutter.  HR controlled on amio and diltiazem. Discharged to [facility] at a weight of 232 lbs.   Admitted 06/16/14 with symptomatic tachy/brady syndrome. 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 Discharge weight was 223 pounds.   Follow up for Heart Failure:  Overall she is feeling ok. Mild dyspnea with exertion. She is unable to weigh at home without help because she cant see the numbers. Able to get around better.  The last weight at home trending up to 228 pounds. Appetite fair.  Meals provided by [facility]. Following a low salt diet and drinking less than 2L a day. Followed by [name].    Labs: 03/13/14: K+ 3.4, creatinine 2.10, BUN 16 04/02/14: K 5.6, creatinine 2.66, BUN 29 04/13/14: K 4.0, creatinine 2.66, K 5.6 06/20/14 K 3.7 creatinine 2.35   ROS: All systems negative except as listed in HPI, PMH and Problem List.  SH:  History   Social History  . Marital Status: Widowed    Spouse Name: N/A    Number of Children: 3  . Years of Education: N/A   Occupational History  . Not on file.   Social History Main Topics  . Smoking status: Former Research scientist (life sciences)  . Smokeless tobacco: Never Used     Comment: 05/2014  QUIT OVER 20 YEARS  AGO "  . Alcohol Use: No  . Drug Use: No  . Sexual Activity: Not Currently   Other Topics Concern  . Not on file   Social History Narrative  . No narrative on file    FH:  Family History  Problem Relation Age of Onset  . Heart disease Mother   . Cancer Mother     bladder  . Kidney disease Mother   . Ovarian cancer Daughter   . Stomach cancer Maternal Uncle   . Colon cancer Maternal Aunt   . Esophageal cancer Neg Hx     Past Medical History  Diagnosis Date  . Atrial flutter     ablated by Dr Lovena Le in 2008  . Hypertension   . Diabetes mellitus   . Diastolic heart failure     a. EF 60-65%, RV nl (03/2014)  . Asthma   . Hyperlipidemia   . Fatty liver   . Esophageal dysmotility   . Arthritis   . Sleep apnea     wears CPAP  . Fatty tumor fatty tumor back  . Coronary atherosclerosis of native coronary artery   . Morbid obesity   . Myocardial infarction 2009  . Heart murmur   . Peripheral vascular disease   . GERD (gastroesophageal reflux disease)     barrets esophagus  . Anginal pain     occ; non-ischemic Lexiscan 09/2012  . Kidney disease     CKD stage IV (Dr. Ollen Gross  [place])  . Complication of anesthesia     " DIFFICULTY BREATHING "  . Persistent atrial fibrillation   . Tachycardia-bradycardia   . [name] esophagus   . Hiatal hernia   . Esophageal dysmotilities     Current Outpatient Prescriptions  Medication Sig Dispense Refill  . acetaminophen (TYLENOL) 500 MG tablet Take 500 mg by mouth every 6 (six) hours as needed for mild pain.      Marland Kitchen amiodarone (PACERONE) 200 MG tablet Take 1 tablet (200 mg total) by mouth daily.  30 tablet  1  . amitriptyline (ELAVIL) 25 MG tablet Take 25 mg by mouth at bedtime.        Marland Kitchen apixaban (ELIQUIS) 5 MG TABS tablet Take 1 tablet (5 mg total) by mouth 2 (two) times daily.  60 tablet  2  . atorvastatin (LIPITOR) 40 MG tablet Take 1 tablet (40 mg total) by mouth daily.  90 tablet  3  . dicyclomine (BENTYL) 10 MG capsule Take 10  mg by mouth daily.       Marland Kitchen diltiazem (CARDIZEM CD) 180 MG 24 hr capsule Take 1 capsule (180 mg total) by mouth daily.  30 capsule  1  . esomeprazole (NEXIUM) 40 MG capsule Take 1 capsule (40 mg total) by mouth daily at 12 noon.  30 capsule  0  . febuxostat (ULORIC) 40 MG tablet Take 40 mg by mouth daily.       . hydrALAZINE (APRESOLINE) 25 MG tablet Take 1 tablet (25 mg total) by mouth 3 (three) times daily.  90 tablet  3  . insulin glargine (LANTUS) 100 UNIT/ML injection Inject 0.2 mLs (20 Units total) into the skin at bedtime.  10 mL  2  . insulin lispro (HUMALOG) 100 UNIT/ML injection Inject 0.2 mLs (20 Units total) into the skin 3 (three) times daily after meals.  10 mL  11  . Linaclotide (LINZESS) 145 MCG CAPS capsule Take 290 mcg by mouth daily.      . metoprolol tartrate (LOPRESSOR) 25 MG tablet Take 3 tablets (75 mg total) by mouth 2 (two) times daily.  180 tablet  3  . nitroGLYCERIN (NITROSTAT) 0.4 MG SL tablet Place 0.4 mg under the tongue every 5 (five) minutes as needed for chest pain.       . potassium chloride SA (K-DUR,KLOR-CON) 20 MEQ tablet Take 1 tablet (20 mEq total) by mouth 2 (two) times daily.  75 tablet  6  . ranitidine (ZANTAC) 150 MG tablet Take 1 tablet (150 mg total) by mouth at bedtime.  30 tablet  0  . torsemide (DEMADEX) 20 MG tablet Take 1-2 tablets (20-40 mg total) by mouth 2 (two) times daily. Take 40 mg a.m., 20 mg p.m.  90 tablet  3  . Vitamin D, Ergocalciferol, (DRISDOL) 50000 UNITS CAPS capsule Take 50,000 Units by mouth every Monday.      Penne Lash HFA 45 MCG/ACT inhaler Inhale 2 puffs into the lungs every 4 (four) hours as needed for wheezing or shortness of breath.  1 Inhaler  1  . zolpidem (AMBIEN) 10 MG tablet Take 10 mg by mouth at bedtime.        No current facility-administered medications for this encounter.    Filed Vitals:   06/29/14 1411  BP: 121/72  Pulse: 59  Resp: 18  Weight: 230 lb 4 oz (104.441 kg)  SpO2: 91%    PHYSICAL  EXAM: General: Obese, NAD, Ambulated in the clinic with a cane. Daughter present HEENT:  normal  Neck: supple. JVP hard to see d/t body habitus but does not appear elevated; Carotids 2+ bilat; no bruits. No lymphadenopathy or thryomegaly appreciate Cor: PMI nonpalpbale. Distant. Irregular rate. No obvious murmur L upper chest steri strips int  Lungs:  Lungs clear Abdomen: Obese soft, nontender. Non-distended. No hepatosplenomegaly. No bruits or masses. Good bowel sounds.  Extremities: no cyanosis, clubbing, rash, warm. Trace edema Neuro: alert & orientedx3, cranial nerves grossly intact. moves all 4 extremities w/o difficulty. Affect pleasant   EKG: Apaced 60 bpm   ASSESSMENT & PLAN:  1) Chronic diastolic HF: EF 123456 (0000000) - Functional improvement. NYHA II-III symptoms and volume status stable. Continue torsemide 40 mg/20 mg  daily. - Reinforced the need and importance of daily weights, a low sodium diet, and fluid restriction (less than 2 L a day). Instructed to call the HF clinic if weight increases more than 3 lbs overnight or 5 lbs in a week.  2) Afib/Aflutter  - Maintaining NSR. Remains on amiodarone, cardizem,  and lopressor. No bleeding problems.   - Continue Eliquis 5 mg BID.  3) CKD stage IV - Baseline Cr 2.1-2.4.  4) HTN - Stable. Continue current regimen.  5) Tachy/Brady syndrome. 06/18/14  S/P PPM . Has  Follow up with Dr Rayann Heman next week.   Follow up in 4 weeks with Dr Donnamae Jude NP-C 2:14 PM

## 2014-06-29 NOTE — Patient Instructions (Signed)
Doing good!  Follow up with Dr. Haroldine Laws in 4 weeks.  Do the following things EVERYDAY: 1) Weigh yourself in the morning before breakfast. Write it down and keep it in a log. 2) Take your medicines as prescribed 3) Eat low salt foods-Limit salt (sodium) to 2000 mg per day.  4) Stay as active as you can everyday 5) Limit all fluids for the day to less than 2 liters 6) Call the Heart Failure Clinic if your weight increases, you have increased shortness of breath, increased edema (swelling), or any other signs/symptoms of heart failure.

## 2014-06-30 DIAGNOSIS — I495 Sick sinus syndrome: Secondary | ICD-10-CM | POA: Insufficient documentation

## 2014-06-30 NOTE — Addendum Note (Signed)
Encounter addended by: Vanessa Barbara, CCT on: 06/30/2014  8:43 AM<BR>     Documentation filed: Charges VN

## 2014-07-01 ENCOUNTER — Ambulatory Visit: Payer: Commercial Managed Care - HMO

## 2014-07-09 ENCOUNTER — Ambulatory Visit (INDEPENDENT_AMBULATORY_CARE_PROVIDER_SITE_OTHER): Payer: Commercial Managed Care - HMO | Admitting: *Deleted

## 2014-07-09 DIAGNOSIS — I495 Sick sinus syndrome: Secondary | ICD-10-CM

## 2014-07-09 LAB — MDC_IDC_ENUM_SESS_TYPE_INCLINIC
Battery Remaining Longevity: 132 mo
Battery Voltage: 2.99 V
Brady Statistic RA Percent Paced: 90 %
Brady Statistic RV Percent Paced: 0.55 %
Date Time Interrogation Session: 20151105164352
Implantable Pulse Generator Model: 2240
Implantable Pulse Generator Serial Number: 7665001
Lead Channel Impedance Value: 512.5 Ohm
Lead Channel Impedance Value: 600 Ohm
Lead Channel Pacing Threshold Amplitude: 1 V
Lead Channel Pacing Threshold Amplitude: 1 V
Lead Channel Pacing Threshold Pulse Width: 0.4 ms
Lead Channel Pacing Threshold Pulse Width: 0.5 ms
Lead Channel Sensing Intrinsic Amplitude: 12 mV
Lead Channel Sensing Intrinsic Amplitude: 5 mV
Lead Channel Setting Pacing Amplitude: 1.25 V
Lead Channel Setting Pacing Amplitude: 2 V
Lead Channel Setting Pacing Pulse Width: 0.5 ms
Lead Channel Setting Sensing Sensitivity: 2 mV

## 2014-07-09 NOTE — Progress Notes (Signed)
Wound check appointment. Steri-strips removed. Wound without redness or edema. Incision edges approximated, wound well healed. Normal device function. Thresholds, sensing, and impedances consistent with implant measurements. Auto capture programmed on for extra safety margin. Histogram distribution appropriate for patient and level of activity. No mode switches or high ventricular rates noted. Patient educated about wound care, arm mobility, lifting restrictions. ROV in 3 months with JA.

## 2014-07-10 ENCOUNTER — Ambulatory Visit (INDEPENDENT_AMBULATORY_CARE_PROVIDER_SITE_OTHER): Payer: Commercial Managed Care - HMO | Admitting: Ophthalmology

## 2014-07-13 ENCOUNTER — Other Ambulatory Visit: Payer: Self-pay | Admitting: Physician Assistant

## 2014-07-21 ENCOUNTER — Encounter (HOSPITAL_COMMUNITY): Payer: Self-pay | Admitting: Emergency Medicine

## 2014-07-21 ENCOUNTER — Emergency Department (HOSPITAL_COMMUNITY)
Admission: EM | Admit: 2014-07-21 | Discharge: 2014-07-21 | Disposition: A | Discharge status: Home / Self Care | Payer: Commercial Managed Care - HMO | Attending: Emergency Medicine | Admitting: Emergency Medicine

## 2014-07-21 DIAGNOSIS — G4730 Sleep apnea, unspecified: Secondary | ICD-10-CM | POA: Insufficient documentation

## 2014-07-21 DIAGNOSIS — E119 Type 2 diabetes mellitus without complications: Secondary | ICD-10-CM | POA: Insufficient documentation

## 2014-07-21 DIAGNOSIS — J45909 Unspecified asthma, uncomplicated: Secondary | ICD-10-CM | POA: Insufficient documentation

## 2014-07-21 DIAGNOSIS — N184 Chronic kidney disease, stage 4 (severe): Secondary | ICD-10-CM | POA: Insufficient documentation

## 2014-07-21 DIAGNOSIS — Z79899 Other long term (current) drug therapy: Secondary | ICD-10-CM | POA: Insufficient documentation

## 2014-07-21 DIAGNOSIS — I2510 Atherosclerotic heart disease of native coronary artery without angina pectoris: Secondary | ICD-10-CM | POA: Insufficient documentation

## 2014-07-21 DIAGNOSIS — K219 Gastro-esophageal reflux disease without esophagitis: Secondary | ICD-10-CM | POA: Insufficient documentation

## 2014-07-21 DIAGNOSIS — M1990 Unspecified osteoarthritis, unspecified site: Secondary | ICD-10-CM | POA: Insufficient documentation

## 2014-07-21 DIAGNOSIS — Z9861 Coronary angioplasty status: Secondary | ICD-10-CM | POA: Insufficient documentation

## 2014-07-21 DIAGNOSIS — Z880 Allergy status to penicillin: Secondary | ICD-10-CM | POA: Insufficient documentation

## 2014-07-21 DIAGNOSIS — N289 Disorder of kidney and ureter, unspecified: Secondary | ICD-10-CM | POA: Insufficient documentation

## 2014-07-21 DIAGNOSIS — Z7901 Long term (current) use of anticoagulants: Secondary | ICD-10-CM | POA: Insufficient documentation

## 2014-07-21 DIAGNOSIS — Z9981 Dependence on supplemental oxygen: Secondary | ICD-10-CM | POA: Insufficient documentation

## 2014-07-21 DIAGNOSIS — I252 Old myocardial infarction: Secondary | ICD-10-CM | POA: Insufficient documentation

## 2014-07-21 DIAGNOSIS — E785 Hyperlipidemia, unspecified: Secondary | ICD-10-CM | POA: Insufficient documentation

## 2014-07-21 DIAGNOSIS — E162 Hypoglycemia, unspecified: Secondary | ICD-10-CM

## 2014-07-21 DIAGNOSIS — Z87891 Personal history of nicotine dependence: Secondary | ICD-10-CM | POA: Insufficient documentation

## 2014-07-21 DIAGNOSIS — Z794 Long term (current) use of insulin: Secondary | ICD-10-CM | POA: Insufficient documentation

## 2014-07-21 DIAGNOSIS — I5030 Unspecified diastolic (congestive) heart failure: Secondary | ICD-10-CM | POA: Insufficient documentation

## 2014-07-21 DIAGNOSIS — I129 Hypertensive chronic kidney disease with stage 1 through stage 4 chronic kidney disease, or unspecified chronic kidney disease: Secondary | ICD-10-CM | POA: Insufficient documentation

## 2014-07-21 LAB — CBC WITH DIFFERENTIAL/PLATELET
Basophils Absolute: 0 10*3/uL (ref 0.0–0.1)
Basophils Relative: 0 % (ref 0–1)
Eosinophils Absolute: 0 10*3/uL (ref 0.0–0.7)
Eosinophils Relative: 0 % (ref 0–5)
HCT: 45.3 % (ref 36.0–46.0)
Hemoglobin: 14.6 g/dL (ref 12.0–15.0)
Lymphocytes Relative: 10 % — ABNORMAL LOW (ref 12–46)
Lymphs Abs: 1.1 10*3/uL (ref 0.7–4.0)
MCH: 30.4 pg (ref 26.0–34.0)
MCHC: 32.2 g/dL (ref 30.0–36.0)
MCV: 94.4 fL (ref 78.0–100.0)
Monocytes Absolute: 1.4 10*3/uL — ABNORMAL HIGH (ref 0.1–1.0)
Monocytes Relative: 13 % — ABNORMAL HIGH (ref 3–12)
Neutro Abs: 8.4 10*3/uL — ABNORMAL HIGH (ref 1.7–7.7)
Neutrophils Relative %: 77 % (ref 43–77)
Platelets: 196 10*3/uL (ref 150–400)
RBC: 4.8 MIL/uL (ref 3.87–5.11)
RDW: 16.8 % — ABNORMAL HIGH (ref 11.5–15.5)
WBC: 11 10*3/uL — ABNORMAL HIGH (ref 4.0–10.5)

## 2014-07-21 LAB — CBG MONITORING, ED
Glucose-Capillary: 115 mg/dL — ABNORMAL HIGH (ref 70–99)
Glucose-Capillary: 131 mg/dL — ABNORMAL HIGH (ref 70–99)
Glucose-Capillary: 169 mg/dL — ABNORMAL HIGH (ref 70–99)

## 2014-07-21 LAB — BASIC METABOLIC PANEL
Anion gap: 15 (ref 5–15)
BUN: 38 mg/dL — ABNORMAL HIGH (ref 6–23)
CO2: 25 mEq/L (ref 19–32)
Calcium: 9.1 mg/dL (ref 8.4–10.5)
Chloride: 102 mEq/L (ref 96–112)
Creatinine, Ser: 2.55 mg/dL — ABNORMAL HIGH (ref 0.50–1.10)
GFR calc Af Amer: 21 mL/min — ABNORMAL LOW (ref >90)
GFR calc non Af Amer: 18 mL/min — ABNORMAL LOW (ref >90)
Glucose, Bld: 131 mg/dL — ABNORMAL HIGH (ref 70–99)
Potassium: 3.6 mEq/L — ABNORMAL LOW (ref 3.7–5.3)
Sodium: 142 mEq/L (ref 137–147)

## 2014-07-21 NOTE — ED Provider Notes (Signed)
CSN: [id number]     Arrival date & time 07/21/14  0544 History   First MD Initiated Contact with Patient 07/21/14 0604     Chief Complaint  Patient presents with  . Hypoglycemia     (Consider location/radiation/quality/duration/timing/severity/associated sxs/prior Treatment) Patient is a 68 y.o. female presenting with hypoglycemia. The history is provided by the patient, the EMS personnel and a relative.  Hypoglycemia She was found by family members this morning and noted to be unresponsive. EMS was called and gave her D50. Initial blood sugar was 37. She was given orange juice and peanut butter sandwiches and CT and temperature came up to 155. Patient admits to taking insulin last night but not eating anything after taking the insulin. She has had some intermittent problems with low blood sugars but never this severe.  Past Medical History  Diagnosis Date  . Atrial flutter     ablated by Dr Lovena Le in 2008  . Hypertension   . Diabetes mellitus   . Diastolic heart failure     a. EF 60-65%, RV nl (03/2014)  . Asthma   . Hyperlipidemia   . Fatty liver   . Esophageal dysmotility   . Arthritis   . Sleep apnea     wears CPAP  . Fatty tumor fatty tumor back  . Coronary atherosclerosis of native coronary artery   . Morbid obesity   . Myocardial infarction 2009  . Heart murmur   . Peripheral vascular disease   . GERD (gastroesophageal reflux disease)     barrets esophagus  . Anginal pain     occ; non-ischemic Lexiscan 09/2012  . Kidney disease     CKD stage IV (Dr. Erling Cruz)  . Complication of anesthesia     " DIFFICULTY BREATHING "  . Persistent atrial fibrillation   . Tachycardia-bradycardia   . [name] esophagus   . Hiatal hernia   . Esophageal dysmotilities    Past Surgical History  Procedure Laterality Date  . Coronary angioplasty with stent placement    . Breast lumpectomy      right  . Tubal ligation    . Tonsillectomy    . Total knee arthroplasty Right  12/15/2013    Procedure: RIGHT TOTAL KNEE ARTHROPLASTY;  Surgeon: Alta Corning, MD;  Location: [place];  Service: Orthopedics;  Laterality: Right;  . Cardioversion N/A 03/03/2014    Procedure: CARDIOVERSION;  Surgeon: Laverda Page, MD;  Location: [place];  Service: Cardiovascular;  Laterality: N/A;  . Atrial flutter ablation  2008    CTI ablation by Dr Lovena Le  . Pacemaker insertion  06/18/14    STJ Assurity dual chamber pacemaker implanted by Dr Rayann Heman   Family History  Problem Relation Age of Onset  . Heart disease Mother   . Cancer Mother     bladder  . Kidney disease Mother   . Ovarian cancer Daughter   . Stomach cancer Maternal Uncle   . Colon cancer Maternal Aunt   . Esophageal cancer Neg Hx    History  Substance Use Topics  . Smoking status: Former Research scientist (life sciences)  . Smokeless tobacco: Never Used     Comment: 05/2014  QUIT OVER 20 YEARS AGO "  . Alcohol Use: No   OB History    No data available     Review of Systems  All other systems reviewed and are negative.     Allergies  Adhesive; Sulfa antibiotics; Codeine; and Penicillins  Home Medications   Prior to Admission  medications   Medication Sig Start Date End Date Taking? Authorizing Provider  acetaminophen (TYLENOL) 500 MG tablet Take 500 mg by mouth every 6 (six) hours as needed for mild pain.    Historical Provider, MD  amiodarone (PACERONE) 200 MG tablet Take 1 tablet (200 mg total) by mouth daily. 06/20/14   Hosie Poisson, MD  amitriptyline (ELAVIL) 25 MG tablet Take 25 mg by mouth at bedtime.      Historical Provider, MD  apixaban (ELIQUIS) 5 MG TABS tablet Take 1 tablet (5 mg total) by mouth 2 (two) times daily. 04/29/14   Jolaine Artist, MD  atorvastatin (LIPITOR) 40 MG tablet Take 1 tablet (40 mg total) by mouth daily. 04/29/14   Jolaine Artist, MD  dicyclomine (BENTYL) 10 MG capsule Take 10 mg by mouth daily.     Historical Provider, MD  diltiazem (CARDIZEM CD) 180 MG 24 hr capsule Take 1 capsule  (180 mg total) by mouth daily. 06/20/14   Hosie Poisson, MD  esomeprazole (NEXIUM) 40 MG capsule Take 1 capsule (40 mg total) by mouth daily at 12 noon. 04/29/14   Jolaine Artist, MD  febuxostat (ULORIC) 40 MG tablet Take 40 mg by mouth daily.     Historical Provider, MD  hydrALAZINE (APRESOLINE) 25 MG tablet Take 1 tablet (25 mg total) by mouth 3 (three) times daily. 06/03/14   Rande Brunt, NP  insulin glargine (LANTUS) 100 UNIT/ML injection Inject 0.2 mLs (20 Units total) into the skin at bedtime. 06/20/14   Hosie Poisson, MD  insulin lispro (HUMALOG) 100 UNIT/ML injection Inject 0.2 mLs (20 Units total) into the skin 3 (three) times daily after meals. 12/23/13   Delfina Redwood, MD  Linaclotide Rolan Lipa) 145 MCG CAPS capsule Take 290 mcg by mouth daily.    Historical Provider, MD  metoprolol tartrate (LOPRESSOR) 25 MG tablet Take 3 tablets (75 mg total) by mouth 2 (two) times daily. 06/03/14   Rande Brunt, NP  nitroGLYCERIN (NITROSTAT) 0.4 MG SL tablet Place 0.4 mg under the tongue every 5 (five) minutes as needed for chest pain.     Historical Provider, MD  potassium chloride SA (K-DUR,KLOR-CON) 20 MEQ tablet Take 1 tablet (20 mEq total) by mouth 2 (two) times daily. 04/02/14   Jolaine Artist, MD  ranitidine (ZANTAC) 150 MG tablet Take 1 tablet (150 mg total) by mouth at bedtime. 04/29/14   Jolaine Artist, MD  torsemide (DEMADEX) 20 MG tablet Take 1-2 tablets (20-40 mg total) by mouth 2 (two) times daily. Take 40 mg a.m., 20 mg p.m. 06/12/14   Evelene Croon [name], PA-C  Vitamin D, Ergocalciferol, (DRISDOL) 50000 UNITS CAPS capsule Take 50,000 Units by mouth every Monday.    Historical Provider, MD  XOPENEX HFA 45 MCG/ACT inhaler Inhale 2 puffs into the lungs every 4 (four) hours as needed for wheezing or shortness of breath. 03/11/14   Marijean Heath, NP  zolpidem (AMBIEN) 10 MG tablet Take 10 mg by mouth at bedtime.     Historical Provider, MD   BP 126/106 mmHg  Pulse 60   Temp(Src) 95 F (35 C) (Rectal)  Resp 18  Ht 5\' 4"  (1.626 m)  Wt 222 lb (100.699 kg)  BMI 38.09 kg/m2  SpO2 96% Physical Exam  Nursing note and vitals reviewed.  68 year old female, resting comfortably and in no acute distress. Vital signs are significant for diastolic hypertension. Oxygen saturation is 96%, which is normal. Head is normocephalic and atraumatic.  PERRLA, EOMI. Oropharynx is clear. Neck is nontender and supple without adenopathy or JVD. Back is nontender and there is no CVA tenderness. Lungs are clear without rales, wheezes, or rhonchi. Chest is nontender. Heart has regular rate and rhythm without murmur. Abdomen is soft, flat, nontender without masses or hepatosplenomegaly and peristalsis is normoactive. Extremities have no cyanosis or edema, full range of motion is present. Skin is warm and dry without rash. Neurologic: Mental status is normal, cranial nerves are intact, there are no motor or sensory deficits.  ED Course  Procedures (including critical care time) Labs Review Results for orders placed or performed during the hospital encounter of 07/21/14  CBC with Differential  Result Value Ref Range   WBC 11.0 (H) 4.0 - 10.5 K/uL   RBC 4.80 3.87 - 5.11 MIL/uL   Hemoglobin 14.6 12.0 - 15.0 g/dL   HCT 45.3 36.0 - 46.0 %   MCV 94.4 78.0 - 100.0 fL   MCH 30.4 26.0 - 34.0 pg   MCHC 32.2 30.0 - 36.0 g/dL   RDW 16.8 (H) 11.5 - 15.5 %   Platelets 196 150 - 400 K/uL   Neutrophils Relative % 77 43 - 77 %   Neutro Abs 8.4 (H) 1.7 - 7.7 K/uL   Lymphocytes Relative 10 (L) 12 - 46 %   Lymphs Abs 1.1 0.7 - 4.0 K/uL   Monocytes Relative 13 (H) 3 - 12 %   Monocytes Absolute 1.4 (H) 0.1 - 1.0 K/uL   Eosinophils Relative 0 0 - 5 %   Eosinophils Absolute 0.0 0.0 - 0.7 K/uL   Basophils Relative 0 0 - 1 %   Basophils Absolute 0.0 0.0 - 0.1 K/uL  Basic metabolic panel  Result Value Ref Range   Sodium 142 137 - 147 mEq/L   Potassium 3.6 (L) 3.7 - 5.3 mEq/L   Chloride 102  96 - 112 mEq/L   CO2 25 19 - 32 mEq/L   Glucose, Bld 131 (H) 70 - 99 mg/dL   BUN 38 (H) 6 - 23 mg/dL   Creatinine, Ser 2.55 (H) 0.50 - 1.10 mg/dL   Calcium 9.1 8.4 - 10.5 mg/dL   GFR calc non Af Amer 18 (L) >90 mL/min   GFR calc Af Amer 21 (L) >90 mL/min   Anion gap 15 5 - 15  CBG monitoring, ED  Result Value Ref Range   Glucose-Capillary 115 (H) 70 - 99 mg/dL  POC CBG, ED  Result Value Ref Range   Glucose-Capillary 131 (H) 70 - 99 mg/dL   MDM   Final diagnoses:  Hypoglycemia  Renal insufficiency    Hypoglycemia. Old records are reviewed and she had recent placement of cardiac pacemaker. She is noted to have renal insufficiency. Metabolic panel be rechecked since worsening renal insufficiency could.make her more prone to hypoglycemia from same dose of insulin.  Blood sugar has been stable in the ED. Renal insufficiency is about at baseline. She is discharged and instructed to make sure she eats a snack before going to bed.  Delora Fuel, MD [id number] [phone]

## 2014-07-21 NOTE — ED Notes (Signed)
CBG rechecked to 169 and an oral temperature of 97.3.

## 2014-07-21 NOTE — Discharge Instructions (Signed)
Make sure to have a snack before going to bed.   Hypoglycemia Hypoglycemia occurs when the glucose in your blood is too low. Glucose is a type of sugar that is your body's main energy source. Hormones, such as insulin and glucagon, control the level of glucose in the blood. Insulin lowers blood glucose and glucagon increases blood glucose. Having too much insulin in your blood stream, or not eating enough food containing sugar, can result in hypoglycemia. Hypoglycemia can happen to people with or without diabetes. It can develop quickly and can be a medical emergency.  CAUSES   Missing or delaying meals.  Not eating enough carbohydrates at meals.  Taking too much diabetes medicine.  Not timing your oral diabetes medicine or insulin doses with meals, snacks, and exercise.  Nausea and vomiting.  Certain medicines.  Severe illnesses, such as hepatitis, kidney disorders, and certain eating disorders.  Increased activity or exercise without eating something extra or adjusting medicines.  Drinking too much alcohol.  A nerve disorder that affects body functions like your heart rate, blood pressure, and digestion (autonomic neuropathy).  A condition where the stomach muscles do not function properly (gastroparesis). Therefore, medicines and food may not absorb properly.  Rarely, a tumor of the pancreas can produce too much insulin. SYMPTOMS   Hunger.  Sweating (diaphoresis).  Change in body temperature.  Shakiness.  Headache.  Anxiety.  Lightheadedness.  Irritability.  Difficulty concentrating.  Dry mouth.  Tingling or numbness in the hands or feet.  Restless sleep or sleep disturbances.  Altered speech and coordination.  Change in mental status.  Seizures or prolonged convulsions.  Combativeness.  Drowsiness (lethargic).  Weakness.  Increased heart rate or palpitations.  Confusion.  Pale, gray skin color.  Blurred or double  vision.  Fainting. DIAGNOSIS  A physical exam and medical history will be performed. Your caregiver may make a diagnosis based on your symptoms. Blood tests and other lab tests may be performed to confirm a diagnosis. Once the diagnosis is made, your caregiver will see if your signs and symptoms go away once your blood glucose is raised.  TREATMENT  Usually, you can easily treat your hypoglycemia when you notice symptoms.  Check your blood glucose. If it is less than 70 mg/dl, take one of the following:   3-4 glucose tablets.    cup juice.    cup regular soda.   1 cup skim milk.   -1 tube of glucose gel.   5-6 hard candies.   Avoid high-fat drinks or food that may delay a rise in blood glucose levels.  Do not take more than the recommended amount of sugary foods, drinks, gel, or tablets. Doing so will cause your blood glucose to go too high.   Wait 10-15 minutes and recheck your blood glucose. If it is still less than 70 mg/dl or below your target range, repeat treatment.   Eat a snack if it is more than 1 hour until your next meal.  There may be a time when your blood glucose may go so low that you are unable to treat yourself at home when you start to notice symptoms. You may need someone to help you. You may even faint or be unable to swallow. If you cannot treat yourself, someone will need to bring you to the hospital.  [place]  If you have diabetes, follow your diabetes management plan by:  Taking your medicines as directed.  Following your exercise plan.  Following your meal  plan. Do not skip meals. Eat on time.  Testing your blood glucose regularly. Check your blood glucose before and after exercise. If you exercise longer or different than usual, be sure to check blood glucose more frequently.  Wearing your medical alert jewelry that says you have diabetes.  Identify the cause of your hypoglycemia. Then, develop ways to prevent the  recurrence of hypoglycemia.  Do not take a hot bath or shower right after an insulin shot.  Always carry treatment with you. Glucose tablets are the easiest to carry.  If you are going to drink alcohol, drink it only with meals.  Tell friends or family members ways to keep you safe during a seizure. This may include removing hard or sharp objects from the area or turning you on your side.  Maintain a healthy weight. SEEK MEDICAL CARE IF:   You are having problems keeping your blood glucose in your target range.  You are having frequent episodes of hypoglycemia.  You feel you might be having side effects from your medicines.  You are not sure why your blood glucose is dropping so low.  You notice a change in vision or a new problem with your vision. SEEK IMMEDIATE MEDICAL CARE IF:   Confusion develops.  A change in mental status occurs.  The inability to swallow develops.  Fainting occurs. Document Released: 08/21/2005 Document Revised: 08/26/2013 Document Reviewed: 12/18/2011 [facility] Patient Information 2015 [place], [place]. This information is not intended to replace advice given to you by your health care provider. Make sure you discuss any questions you have with your health care provider.

## 2014-07-21 NOTE — ED Notes (Signed)
Dr. Glick at the bedside.  

## 2014-07-21 NOTE — ED Notes (Addendum)
Discharge delayed due to patients temporal temperature 95.2.  Dr. Roxanne Mins made aware of patients condition.  Biar hugger to be placed on patient.

## 2014-07-21 NOTE — ED Notes (Signed)
Phlebotomy at the bedside  

## 2014-07-21 NOTE — ED Notes (Signed)
CBG is 115. °

## 2014-07-21 NOTE — ED Notes (Signed)
Patient given sandwich.  

## 2014-07-21 NOTE — ED Notes (Signed)
Spoke to Dr. Roxanne Mins in regards to patient's case. Current temp of 95.0.  MD advises to warm with regular blankets since patient has already eaten.

## 2014-07-21 NOTE — ED Notes (Signed)
Per EMS, the daughter found the patient on the floor, patient is diabetic. Took insulin prior to sleeping.  Patient was unresponsive. Snoring. CBG was 37, with withdrawal of pain. 1 amp of d50 given. No complaints of pain. 2 peanut butter sandwiches, orange juice and 2 cups of sweet tea. 155 CBG recheck 155, and then 132 again. Felt dizzy last night before she went to bed. No history of vertigo. Pacemaker placed 3-4 weeks ago. Stroke screen negative. Denies n/v. BP 144/77, p 60. 100% on 2L. Patient wears c-pap at night.

## 2014-07-29 ENCOUNTER — Ambulatory Visit (HOSPITAL_COMMUNITY)
Admission: RE | Admit: 2014-07-29 | Discharge: 2014-07-29 | Disposition: A | Discharge status: Home / Self Care | Payer: Medicare HMO | Source: Ambulatory Visit | Attending: Internal Medicine | Admitting: Internal Medicine

## 2014-07-29 VITALS — BP 128/69 | HR 68 | Resp 18 | Wt 233.0 lb

## 2014-07-29 DIAGNOSIS — I495 Sick sinus syndrome: Secondary | ICD-10-CM | POA: Diagnosis not present

## 2014-07-29 DIAGNOSIS — I10 Essential (primary) hypertension: Secondary | ICD-10-CM | POA: Insufficient documentation

## 2014-07-29 DIAGNOSIS — K589 Irritable bowel syndrome without diarrhea: Secondary | ICD-10-CM

## 2014-07-29 DIAGNOSIS — I5032 Chronic diastolic (congestive) heart failure: Secondary | ICD-10-CM | POA: Diagnosis not present

## 2014-07-29 DIAGNOSIS — I4892 Unspecified atrial flutter: Secondary | ICD-10-CM | POA: Diagnosis not present

## 2014-07-29 DIAGNOSIS — I480 Paroxysmal atrial fibrillation: Secondary | ICD-10-CM

## 2014-07-29 DIAGNOSIS — I4891 Unspecified atrial fibrillation: Secondary | ICD-10-CM | POA: Diagnosis not present

## 2014-07-29 DIAGNOSIS — N184 Chronic kidney disease, stage 4 (severe): Secondary | ICD-10-CM | POA: Diagnosis not present

## 2014-07-29 NOTE — Progress Notes (Signed)
Patient ID: [name], female   DOB: 02-11-1946, 68 y.o.   MRN: [id number]  Primary Cardiologist: Dr. Einar Gip PCP: Dr. [name] Nephrologist: Dr. Florene Glen EP: Dr Rayann Heman   HPI: [name] is a 68 year old woman with hypertension, hyperlipidemia, bronchial asthma, diabetes mellitus, CKD stage IV (baseline cr ~2.5), atrial fibrillation and diastolic HF.  She was admitted in 4/15 for R TKR. That hospitalization complicated by a/c diastolic HF with respiratory distress requiring non-rebreather support. Weight on discharge was 226 pounds. ABG at that time 7.4/34/91/97%. Cr peaked at 2.9  Admitted 6/30-7/10/15 for SOB and CP following scheduled cardioversion. Found to have severe aspiration PNA and was intubated. She maintained SR for short period of time and then went back into Afib/Aflutter.  HR controlled on amio and diltiazem. Discharged to [facility] at a weight of 232 lbs.   Admitted 06/16/14 with symptomatic tachy/brady syndrome. 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 Discharge weight was 223 pounds.   Follow up for Heart Failure: Doing well. Mild dyspnea with exertion. No change. Able to get around better. Using talking scale and weight at home has been stable at 231. Denies edema, orthopnea or PND.Occasional palpitations and dizziness. Nothing sustained. Recently seen in ED for low blood sugar. No bleeding with Eliquis.   Labs: 03/13/14: K+ 3.4, creatinine 2.10, BUN 16 04/02/14: K 5.6, creatinine 2.66, BUN 29 04/13/14: K 4.0, creatinine 2.66, K 5.6 06/20/14 K 3.7 creatinine 2.35  07/21/14 K 3.6 creatinine 2.55  ROS: All systems negative except as listed in HPI, PMH and Problem List.  SH:  History   Social History  . Marital Status: Widowed    Spouse Name: N/A    Number of Children: 3  . Years of Education: N/A   Occupational History  . Not on file.   Social History Main Topics  . Smoking status: Former Research scientist (life sciences)  . Smokeless tobacco: Never Used     Comment: 05/2014  QUIT OVER 20 YEARS AGO "   . Alcohol Use: No  . Drug Use: No  . Sexual Activity: Not Currently   Other Topics Concern  . Not on file   Social History Narrative    FH:  Family History  Problem Relation Age of Onset  . Heart disease Mother   . Cancer Mother     bladder  . Kidney disease Mother   . Ovarian cancer Daughter   . Stomach cancer Maternal Uncle   . Colon cancer Maternal Aunt   . Esophageal cancer Neg Hx     Past Medical History  Diagnosis Date  . Atrial flutter     ablated by Dr Lovena Le in 2008  . Hypertension   . Diabetes mellitus   . Diastolic heart failure     a. EF 60-65%, RV nl (03/2014)  . Asthma   . Hyperlipidemia   . Fatty liver   . Esophageal dysmotility   . Arthritis   . Sleep apnea     wears CPAP  . Fatty tumor fatty tumor back  . Coronary atherosclerosis of native coronary artery   . Morbid obesity   . Myocardial infarction 2009  . Heart murmur   . Peripheral vascular disease   . GERD (gastroesophageal reflux disease)     barrets esophagus  . Anginal pain     occ; non-ischemic Lexiscan 09/2012  . Kidney disease     CKD stage IV (Dr. Erling Cruz)  . Complication of anesthesia     " DIFFICULTY BREATHING "  . Persistent atrial  fibrillation   . Tachycardia-bradycardia   . [name] esophagus   . Hiatal hernia   . Esophageal dysmotilities     Current Outpatient Prescriptions  Medication Sig Dispense Refill  . acetaminophen (TYLENOL) 500 MG tablet Take 500 mg by mouth every 6 (six) hours as needed for mild pain.    Marland Kitchen amiodarone (PACERONE) 200 MG tablet Take 1 tablet (200 mg total) by mouth daily. 30 tablet 1  . amitriptyline (ELAVIL) 25 MG tablet Take 25 mg by mouth at bedtime.      Marland Kitchen apixaban (ELIQUIS) 5 MG TABS tablet Take 1 tablet (5 mg total) by mouth 2 (two) times daily. 60 tablet 2  . atorvastatin (LIPITOR) 40 MG tablet Take 1 tablet (40 mg total) by mouth daily. 90 tablet 3  . dicyclomine (BENTYL) 10 MG capsule Take 10 mg by mouth daily.     Marland Kitchen diltiazem  (CARDIZEM CD) 180 MG 24 hr capsule Take 1 capsule (180 mg total) by mouth daily. 30 capsule 1  . esomeprazole (NEXIUM) 40 MG capsule Take 1 capsule (40 mg total) by mouth daily at 12 noon. 30 capsule 0  . febuxostat (ULORIC) 40 MG tablet Take 40 mg by mouth daily.     . hydrALAZINE (APRESOLINE) 25 MG tablet Take 1 tablet (25 mg total) by mouth 3 (three) times daily. 90 tablet 3  . insulin glargine (LANTUS) 100 UNIT/ML injection Inject 0.2 mLs (20 Units total) into the skin at bedtime. (Patient taking differently: Inject 20 Units into the skin daily as needed (high blood sugar). ) 10 mL 2  . insulin lispro (HUMALOG) 100 UNIT/ML injection Inject 0.2 mLs (20 Units total) into the skin 3 (three) times daily after meals. (Patient taking differently: Inject 20 Units into the skin 3 (three) times daily as needed for high blood sugar. ) 10 mL 11  . Linaclotide (LINZESS) 145 MCG CAPS capsule Take 290 mcg by mouth daily.    . metoprolol tartrate (LOPRESSOR) 25 MG tablet Take 3 tablets (75 mg total) by mouth 2 (two) times daily. 180 tablet 3  . nitroGLYCERIN (NITROSTAT) 0.4 MG SL tablet Place 0.4 mg under the tongue every 5 (five) minutes as needed for chest pain.     . potassium chloride SA (K-DUR,KLOR-CON) 20 MEQ tablet Take 1 tablet (20 mEq total) by mouth 2 (two) times daily. 75 tablet 6  . ranitidine (ZANTAC) 150 MG tablet Take 1 tablet (150 mg total) by mouth at bedtime. 30 tablet 0  . torsemide (DEMADEX) 20 MG tablet Take 1-2 tablets (20-40 mg total) by mouth 2 (two) times daily. Take 40 mg a.m., 20 mg p.m. 90 tablet 3  . Vitamin D, Ergocalciferol, (DRISDOL) 50000 UNITS CAPS capsule Take 50,000 Units by mouth every Monday.    Penne Lash HFA 45 MCG/ACT inhaler Inhale 2 puffs into the lungs every 4 (four) hours as needed for wheezing or shortness of breath. 1 Inhaler 1  . zolpidem (AMBIEN) 10 MG tablet Take 10 mg by mouth at bedtime.      No current facility-administered medications for this encounter.     Filed Vitals:   07/29/14 1405  BP: 128/69  Pulse: 68  Resp: 18  Weight: 233 lb (105.688 kg)  SpO2: 92%    PHYSICAL EXAM: General: Obese, NAD, Ambulated in the clinic with a cane. Daughter present HEENT: normal  Neck: supple. JVP hard to see d/t body habitus but does not appear elevated; Carotids 2+ bilat; no bruits. No lymphadenopathy or thryomegaly appreciate  Cor: PMI nonpalpbale. Distant. Irregular rate. No obvious murmur L upper chest steri strips int  Lungs:  Lungs clear Abdomen: Obese soft, nontender. Non-distended. No hepatosplenomegaly. No bruits or masses. Good bowel sounds.  Extremities: no cyanosis, clubbing, rash, warm. Trace edema Neuro: alert & orientedx3, cranial nerves grossly intact. moves all 4 extremities w/o difficulty. Affect pleasant   EKG: Apaced 60 bpm   ASSESSMENT & PLAN:  1) Chronic diastolic HF: EF 123456 (0000000) - Doing very well! NYHA II symptoms. Volume status looks good despite weight gain. Continue torsemide 40 mg/20 mg daily. If weight hits 233 or greater at home can take torsemide 40/40 with extra dose of kcl.  - Reinforced the need and importance of daily weights, a low sodium diet, and fluid restriction (less than 2 L a day). Instructed to call the HF clinic if weight increases more than 3 lbs overnight or 5 lbs in a week.  2) Afib/Aflutter  - Maintaining NSR. Remains on amiodarone, cardizem,  and lopressor. No bleeding problems.  PPM in place.  - Continue Eliquis 5 mg BID.  3) CKD stage IV - Stable. Baseline Cr 2.1-2.5.  4) HTN - Stable. Continue current regimen.  5) Tachy/Brady syndrome. 06/18/14  S/P PPM.   F/u 2 months  [name] MD  2:05 PM

## 2014-07-29 NOTE — Patient Instructions (Signed)
If weight is 233 lb or greater take an extra 20 mg of Torsemide   We will contact you in 2 months to schedule your next appointment.

## 2014-07-29 NOTE — Addendum Note (Signed)
Encounter addended by: Scarlette Calico, RN on: 07/29/2014  2:25 PM<BR>     Documentation filed: Visit Diagnoses, Dx Association, Patient Instructions Section, Orders

## 2014-07-30 NOTE — Addendum Note (Signed)
Encounter addended by: Vanessa Barbara, CCT on: 07/30/2014 10:03 AM<BR>     Documentation filed: Charges VN

## 2014-08-03 ENCOUNTER — Encounter: Payer: Self-pay | Admitting: Internal Medicine

## 2014-08-13 ENCOUNTER — Encounter (HOSPITAL_COMMUNITY): Payer: Self-pay | Admitting: Internal Medicine

## 2014-08-19 ENCOUNTER — Other Ambulatory Visit: Payer: Self-pay

## 2014-08-19 MED ORDER — AMIODARONE HCL 200 MG PO TABS: 200.0000 mg | ORAL_TABLET | Freq: Every day | ORAL | Status: DC

## 2014-09-17 ENCOUNTER — Telehealth (HOSPITAL_COMMUNITY): Payer: Self-pay | Admitting: Vascular Surgery

## 2014-09-17 NOTE — Telephone Encounter (Signed)
Recent implant 2 months ago, patient to be seen by Dr. Rayann Heman in 2 wks.  Encouraged Tylenol until seen by him.  No s/s of infection, bleeding noted.  [name]

## 2014-09-17 NOTE — Telephone Encounter (Signed)
Pt needs to talk to someone about her pace maker sight being sore .Marland Kitchen Please advise

## 2014-09-21 ENCOUNTER — Ambulatory Visit (INDEPENDENT_AMBULATORY_CARE_PROVIDER_SITE_OTHER): Payer: Commercial Managed Care - HMO | Admitting: Ophthalmology

## 2014-09-21 DIAGNOSIS — H43813 Vitreous degeneration, bilateral: Secondary | ICD-10-CM

## 2014-09-21 DIAGNOSIS — E11319 Type 2 diabetes mellitus with unspecified diabetic retinopathy without macular edema: Secondary | ICD-10-CM

## 2014-09-21 DIAGNOSIS — H35033 Hypertensive retinopathy, bilateral: Secondary | ICD-10-CM

## 2014-09-21 DIAGNOSIS — E11329 Type 2 diabetes mellitus with mild nonproliferative diabetic retinopathy without macular edema: Secondary | ICD-10-CM

## 2014-09-21 DIAGNOSIS — I10 Essential (primary) hypertension: Secondary | ICD-10-CM

## 2014-09-21 DIAGNOSIS — H2513 Age-related nuclear cataract, bilateral: Secondary | ICD-10-CM

## 2014-10-05 ENCOUNTER — Ambulatory Visit (HOSPITAL_COMMUNITY)
Admission: RE | Admit: 2014-10-05 | Discharge: 2014-10-05 | Disposition: A | Discharge status: Home / Self Care | Payer: Commercial Managed Care - HMO | Source: Ambulatory Visit | Attending: Internal Medicine | Admitting: Internal Medicine

## 2014-10-05 ENCOUNTER — Other Ambulatory Visit (HOSPITAL_COMMUNITY): Payer: Self-pay | Admitting: Anesthesiology

## 2014-10-05 ENCOUNTER — Encounter: Payer: Self-pay | Admitting: Internal Medicine

## 2014-10-05 ENCOUNTER — Ambulatory Visit (INDEPENDENT_AMBULATORY_CARE_PROVIDER_SITE_OTHER): Payer: Commercial Managed Care - HMO | Admitting: Internal Medicine

## 2014-10-05 VITALS — BP 124/82 | HR 60 | Ht 64.0 in | Wt 236.6 lb

## 2014-10-05 VITALS — BP 106/62 | HR 59 | Wt 236.0 lb

## 2014-10-05 DIAGNOSIS — K219 Gastro-esophageal reflux disease without esophagitis: Secondary | ICD-10-CM | POA: Diagnosis not present

## 2014-10-05 DIAGNOSIS — I252 Old myocardial infarction: Secondary | ICD-10-CM | POA: Diagnosis not present

## 2014-10-05 DIAGNOSIS — I4891 Unspecified atrial fibrillation: Secondary | ICD-10-CM

## 2014-10-05 DIAGNOSIS — N184 Chronic kidney disease, stage 4 (severe): Secondary | ICD-10-CM | POA: Insufficient documentation

## 2014-10-05 DIAGNOSIS — E119 Type 2 diabetes mellitus without complications: Secondary | ICD-10-CM | POA: Insufficient documentation

## 2014-10-05 DIAGNOSIS — Z794 Long term (current) use of insulin: Secondary | ICD-10-CM | POA: Diagnosis not present

## 2014-10-05 DIAGNOSIS — E785 Hyperlipidemia, unspecified: Secondary | ICD-10-CM | POA: Diagnosis not present

## 2014-10-05 DIAGNOSIS — I10 Essential (primary) hypertension: Secondary | ICD-10-CM

## 2014-10-05 DIAGNOSIS — I4892 Unspecified atrial flutter: Secondary | ICD-10-CM | POA: Diagnosis not present

## 2014-10-05 DIAGNOSIS — I480 Paroxysmal atrial fibrillation: Secondary | ICD-10-CM

## 2014-10-05 DIAGNOSIS — I739 Peripheral vascular disease, unspecified: Secondary | ICD-10-CM | POA: Insufficient documentation

## 2014-10-05 DIAGNOSIS — Z7901 Long term (current) use of anticoagulants: Secondary | ICD-10-CM | POA: Diagnosis not present

## 2014-10-05 DIAGNOSIS — G4730 Sleep apnea, unspecified: Secondary | ICD-10-CM | POA: Insufficient documentation

## 2014-10-05 DIAGNOSIS — Z87891 Personal history of nicotine dependence: Secondary | ICD-10-CM | POA: Diagnosis not present

## 2014-10-05 DIAGNOSIS — I129 Hypertensive chronic kidney disease with stage 1 through stage 4 chronic kidney disease, or unspecified chronic kidney disease: Secondary | ICD-10-CM | POA: Diagnosis not present

## 2014-10-05 DIAGNOSIS — R7989 Other specified abnormal findings of blood chemistry: Secondary | ICD-10-CM

## 2014-10-05 DIAGNOSIS — I5032 Chronic diastolic (congestive) heart failure: Secondary | ICD-10-CM | POA: Insufficient documentation

## 2014-10-05 DIAGNOSIS — I495 Sick sinus syndrome: Secondary | ICD-10-CM

## 2014-10-05 DIAGNOSIS — I481 Persistent atrial fibrillation: Secondary | ICD-10-CM | POA: Insufficient documentation

## 2014-10-05 DIAGNOSIS — J45909 Unspecified asthma, uncomplicated: Secondary | ICD-10-CM | POA: Diagnosis not present

## 2014-10-05 DIAGNOSIS — R001 Bradycardia, unspecified: Secondary | ICD-10-CM

## 2014-10-05 DIAGNOSIS — Z950 Presence of cardiac pacemaker: Secondary | ICD-10-CM

## 2014-10-05 DIAGNOSIS — I2510 Atherosclerotic heart disease of native coronary artery without angina pectoris: Secondary | ICD-10-CM | POA: Diagnosis not present

## 2014-10-05 LAB — MDC_IDC_ENUM_SESS_TYPE_INCLINIC
Battery Remaining Longevity: 130.8 mo
Battery Voltage: 3.02 V
Brady Statistic RA Percent Paced: 99 %
Brady Statistic RV Percent Paced: 0.21 %
Date Time Interrogation Session: 20160201144044
Implantable Pulse Generator Model: 2240
Implantable Pulse Generator Serial Number: 7665001
Lead Channel Impedance Value: 550 Ohm
Lead Channel Impedance Value: 587.5 Ohm
Lead Channel Pacing Threshold Amplitude: 1 V
Lead Channel Pacing Threshold Amplitude: 1.375 V
Lead Channel Pacing Threshold Pulse Width: 0.4 ms
Lead Channel Pacing Threshold Pulse Width: 0.5 ms
Lead Channel Sensing Intrinsic Amplitude: 12 mV
Lead Channel Sensing Intrinsic Amplitude: 5 mV
Lead Channel Setting Pacing Amplitude: 1.625
Lead Channel Setting Pacing Amplitude: 2 V
Lead Channel Setting Pacing Pulse Width: 0.5 ms
Lead Channel Setting Sensing Sensitivity: 2 mV

## 2014-10-05 LAB — COMPREHENSIVE METABOLIC PANEL
ALT: 20 U/L (ref 0–35)
AST: 33 U/L (ref 0–37)
Albumin: 3.7 g/dL (ref 3.5–5.2)
Alkaline Phosphatase: 73 U/L (ref 39–117)
Anion gap: 10 (ref 5–15)
BUN: 57 mg/dL — ABNORMAL HIGH (ref 6–23)
CO2: 28 mmol/L (ref 19–32)
Calcium: 9.7 mg/dL (ref 8.4–10.5)
Chloride: 102 mmol/L (ref 96–112)
Creatinine, Ser: 3.93 mg/dL — ABNORMAL HIGH (ref 0.50–1.10)
GFR calc Af Amer: 13 mL/min — ABNORMAL LOW (ref >90)
GFR calc non Af Amer: 11 mL/min — ABNORMAL LOW (ref >90)
Glucose, Bld: 101 mg/dL — ABNORMAL HIGH (ref 70–99)
Potassium: 2.8 mmol/L — ABNORMAL LOW (ref 3.5–5.1)
Sodium: 140 mmol/L (ref 135–145)
Total Bilirubin: 0.5 mg/dL (ref 0.3–1.2)
Total Protein: 7.8 g/dL (ref 6.0–8.3)

## 2014-10-05 LAB — CBC
HCT: 40.1 % (ref 36.0–46.0)
Hemoglobin: 12.8 g/dL (ref 12.0–15.0)
MCH: 28.3 pg (ref 26.0–34.0)
MCHC: 31.9 g/dL (ref 30.0–36.0)
MCV: 88.5 fL (ref 78.0–100.0)
Platelets: 210 10*3/uL (ref 150–400)
RBC: 4.53 MIL/uL (ref 3.87–5.11)
RDW: 14.9 % (ref 11.5–15.5)
WBC: 7.5 10*3/uL (ref 4.0–10.5)

## 2014-10-05 LAB — TSH: TSH: 4.153 u[IU]/mL (ref 0.350–4.500)

## 2014-10-05 NOTE — Patient Instructions (Signed)
Labs today  We will contact you in 3 months to schedule your next appointment.  

## 2014-10-05 NOTE — Patient Instructions (Signed)
Your physician wants you to follow-up in: 12 months with Chanetta Marshall, NP You will receive a reminder letter in the mail two months in advance. If you don't receive a letter, please call our office to schedule the follow-up appointment.  Remote monitoring is used to monitor your Pacemaker or ICD from home. This monitoring reduces the number of office visits required to check your device to one time per year. It allows [place] to keep an eye on the functioning of your device to ensure it is working properly. You are scheduled for a device check from home on 01/04/15. You may send your transmission at any time that day. If you have a wireless device, the transmission will be sent automatically. After your physician reviews your transmission, you will receive a postcard with your next transmission date.

## 2014-10-05 NOTE — Progress Notes (Signed)
Electrophysiology Office Note   Date:  10/05/2014   ID:  [name], DOB [birth year], MRN [id number]  PCP:  [name], MD  Cardiologist:  Dr Haroldine Laws Primary Electrophysiologist: [name], MD    Chief Complaint  Patient presents with  . Shortness of Breath     History of Present Illness: [name] is a 69 y.o. female who presents today for electrophysiology evaluation.   She has done well since her PPM implant.  She feels that her energy has improved.  She has had no afib.  She continues to have a poor exercise tolerance and requires O2 at times due to SOB.  She has chronic renal failure with stable edema. Today, she denies symptoms of palpitations, chest pain, claudication, dizziness, presyncope, syncope, bleeding, or neurologic sequela. The patient is tolerating medications without difficulties and is otherwise without complaint today.    Past Medical History  Diagnosis Date  . Atrial flutter     ablated by Dr Lovena Le in 2008  . Hypertension   . Diabetes mellitus   . Diastolic heart failure     a. EF 60-65%, RV nl (03/2014)  . Asthma   . Hyperlipidemia   . Fatty liver   . Esophageal dysmotility   . Arthritis   . Sleep apnea     wears CPAP  . Fatty tumor fatty tumor back  . Coronary atherosclerosis of native coronary artery   . Morbid obesity   . Myocardial infarction 2009  . Heart murmur   . Peripheral vascular disease   . GERD (gastroesophageal reflux disease)     barrets esophagus  . Anginal pain     occ; non-ischemic Lexiscan 09/2012  . Kidney disease     CKD stage IV (Dr. Erling Cruz)  . Complication of anesthesia     " DIFFICULTY BREATHING "  . Persistent atrial fibrillation     chads2vasc score of at least 5  . Sick sinus syndrome   . [name] esophagus   . Hiatal hernia   . Esophageal dysmotilities    Past Surgical History  Procedure Laterality Date  . Coronary angioplasty with stent placement    . Breast lumpectomy      right  .  Tubal ligation    . Tonsillectomy    . Total knee arthroplasty Right 12/15/2013    Procedure: RIGHT TOTAL KNEE ARTHROPLASTY;  Surgeon: Alta Corning, MD;  Location: [facility];  Service: Orthopedics;  Laterality: Right;  . Cardioversion N/A 03/03/2014    Procedure: CARDIOVERSION;  Surgeon: Laverda Page, MD;  Location: [place];  Service: Cardiovascular;  Laterality: N/A;  . Atrial flutter ablation  2008    CTI ablation by Dr Lovena Le  . Pacemaker insertion  06/18/14    STJ Assurity dual chamber pacemaker implanted by Dr Rayann Heman  . Right heart catheterization N/A 06/12/2014    Procedure: RIGHT HEART CATH;  Surgeon: Jolaine Artist, MD;  Location: [facility] CATH LAB;  Service: Cardiovascular;  Laterality: N/A;  . Permanent pacemaker insertion N/A 06/18/2014    SJM Assurity DR pacemaker implanted by Dr Rayann Heman for sick sinus syndrome     Current Outpatient Prescriptions  Medication Sig Dispense Refill  . acetaminophen (TYLENOL) 500 MG tablet Take 500 mg by mouth every 6 (six) hours as needed for mild pain.    Marland Kitchen albuterol (PROVENTIL) (2.5 MG/3ML) 0.083% nebulizer solution Use four times a day as needed for shortness of breath or wheezing    . amiodarone (PACERONE) 200  MG tablet Take 1 tablet (200 mg total) by mouth daily. 30 tablet 3  . amitriptyline (ELAVIL) 25 MG tablet Take 25 mg by mouth at bedtime.      Marland Kitchen apixaban (ELIQUIS) 5 MG TABS tablet Take 1 tablet (5 mg total) by mouth 2 (two) times daily. 60 tablet 2  . atorvastatin (LIPITOR) 40 MG tablet Take 1 tablet (40 mg total) by mouth daily. 90 tablet 3  . dicyclomine (BENTYL) 10 MG capsule Take 10 mg by mouth daily.     Marland Kitchen diltiazem (CARDIZEM CD) 180 MG 24 hr capsule Take 1 capsule (180 mg total) by mouth daily. 30 capsule 1  . esomeprazole (NEXIUM) 40 MG capsule Take 1 capsule (40 mg total) by mouth daily at 12 noon. 30 capsule 0  . febuxostat (ULORIC) 40 MG tablet Take 40 mg by mouth daily.     . hydrALAZINE (APRESOLINE) 25 MG tablet TAKE ONE  TABLET BY MOUTH THREE TIMES DAILY 90 tablet 0  . HYDROcodone-acetaminophen (NORCO/VICODIN) 5-325 MG per tablet Take 1 tablet by mouth every 6 (six) hours as needed. Pain    . insulin glargine (LANTUS) 100 UNIT/ML injection Inject 0.2 mLs (20 Units total) into the skin at bedtime. (Patient taking differently: Inject 20 Units into the skin daily as needed (high blood sugar). ) 10 mL 2  . insulin lispro (HUMALOG) 100 UNIT/ML injection Inject 0.2 mLs (20 Units total) into the skin 3 (three) times daily after meals. (Patient taking differently: Inject 50 Units into the skin every morning. ) 10 mL 11  . Linaclotide (LINZESS) 145 MCG CAPS capsule Take 145 mcg by mouth daily.     . metolazone (ZAROXOLYN) 2.5 MG tablet Take 1 tablet by mouth daily as needed. Fluid    . metoprolol tartrate (LOPRESSOR) 25 MG tablet Take 3 tablets (75 mg total) by mouth 2 (two) times daily. 180 tablet 3  . nitroGLYCERIN (NITROSTAT) 0.4 MG SL tablet Place 0.4 mg under the tongue every 5 (five) minutes as needed for chest pain.     . potassium chloride SA (K-DUR,KLOR-CON) 20 MEQ tablet Take 1 tablet (20 mEq total) by mouth 2 (two) times daily. 75 tablet 6  . ranitidine (ZANTAC) 150 MG tablet Take 1 tablet (150 mg total) by mouth at bedtime. 30 tablet 0  . torsemide (DEMADEX) 20 MG tablet Take 1-2 tablets (20-40 mg total) by mouth 2 (two) times daily. Take 40 mg a.m., 20 mg p.m. 90 tablet 3  . Vitamin D, Ergocalciferol, (DRISDOL) 50000 UNITS CAPS capsule Take 50,000 Units by mouth every Monday.    Penne Lash HFA 45 MCG/ACT inhaler Inhale 2 puffs into the lungs every 4 (four) hours as needed for wheezing or shortness of breath. 1 Inhaler 1  . zolpidem (AMBIEN) 10 MG tablet Take 10 mg by mouth at bedtime.      No current facility-administered medications for this visit.    Allergies:   Adhesive; Sulfa antibiotics; Codeine; and Penicillins   Social History:  The patient  reports that she has quit smoking. She has never used  smokeless tobacco. She reports that she does not drink alcohol or use illicit drugs.   Family History:  The patient's family history includes Cancer in her mother; Colon cancer in her maternal aunt; Heart disease in her mother; Kidney disease in her mother; Ovarian cancer in her daughter; Stomach cancer in her maternal uncle. There is no history of Esophageal cancer.    ROS:  Please see the history of  present illness.   All other systems are reviewed and negative.    PHYSICAL EXAM: VS:  BP 124/82 mmHg  Pulse 60  Ht 5\' 4"  (1.626 m)  Wt 236 lb 9.6 oz (107.321 kg)  BMI 40.59 kg/m2 , BMI Body mass index is 40.59 kg/(m^2). GEN: Overweight, well developed, in no acute distress HEENT: normal Neck: no JVD, carotid bruits, or masses Cardiac: RRR  Respiratory:  Decreased BS, normal work of breathing, wearing o2 today GI: soft, nontender, nondistended, + BS MS: no deformity or atrophy Skin: warm and dry, device pocket is well healed Neuro:  Strength and sensation are intact Psych: euthymic mood, full affect  EKG:  EKG is ordered today. The ekg ordered today shows atrial pacing at 60 bpm, PR 202, LVH  Device interrogation is reviewed today in detail.  See PaceArt for details.   Recent Labs: 06/09/2014: Pro B Natriuretic peptide (BNP) 2594.0* 06/16/2014: ALT 24; Magnesium 2.1; TSH 6.300* 07/21/2014: BUN 38*; Creatinine 2.55*; Hemoglobin 14.6; Platelets 196; Potassium 3.6*; Sodium 142    Lipid Panel  No results found for: CHOL, TRIG, HDL, CHOLHDL, VLDL, LDLCALC, LDLDIRECT   Wt Readings from Last 3 Encounters:  10/05/14 236 lb 9.6 oz (107.321 kg)  07/29/14 233 lb (105.688 kg)  07/21/14 222 lb (100.699 kg)      Other studies Reviewed: Additional studies/ records that were reviewed today include: Hospital discharge records     ASSESSMENT AND PLAN:  1.  Sick sinus syndrome Normal pacemaker function See Pace Art report No changes today  2. Paroxysmal atrial  fibrillation Controlled with no afib detected since last interrogation Continue anticoagulation. Dr Haroldine Laws to follow creatinine.  Once CrCl <26, would need to stop eliquis due to bleeding risks. Would consider reducing metoprolol to 50mg  BID (will defer to Dr Haroldine Laws)  3. HTN Stable No change required today  4. Obesity Weight loss is essential for him to do well long term. Today, I have given her a flyer with date of our weight management class for AF.    Current medicines are reviewed at length with the patient today.   The patient does not have concerns regarding her medicines.  The following changes were made today:  none   Follow-up: Merlin Follow-up with Dr Haroldine Laws as scheduled Return to see Chanetta Marshall NP in the EP device clinic in 1 year  Signed, [name], MD  10/05/2014 2:43 PM     [facility] [address] [place] [address] [phone] (office) [phone] (fax)

## 2014-10-05 NOTE — Addendum Note (Signed)
Encounter addended by: Scarlette Calico, RN on: 10/05/2014  4:01 PM<BR>     Documentation filed: Visit Diagnoses, Dx Association, Patient Instructions Section, Orders

## 2014-10-05 NOTE — Progress Notes (Signed)
Patient ID: [name], female   DOB: April 04, 1946, 69 y.o.   MRN: [id number]  Primary Cardiologist: [place] PCP: Dr. [name] Nephrologist: Dr. Florene Glen EP: Dr Rayann Heman   HPI: [name] is a 69 year old woman with hypertension, hyperlipidemia, bronchial asthma, diabetes mellitus, CKD stage IV (baseline cr ~2.5), atrial fibrillation and diastolic HF.  She was admitted in 4/15 for R TKR. That hospitalization complicated by a/c diastolic HF with respiratory distress requiring non-rebreather support. Weight on discharge was 226 pounds. ABG at that time 7.4/34/91/97%. Cr peaked at 2.9  Admitted 6/30-7/10/15 for SOB and CP following scheduled cardioversion. Found to have severe aspiration PNA and was intubated. She maintained SR for short period of time and then went back into Afib/Aflutter.  HR controlled on amio and diltiazem. Discharged to [facility] at a weight of 232 lbs.   Admitted 06/16/14 with symptomatic tachy/brady syndrome. 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 Discharge weight was 223 pounds.   Follow up for Heart Failure: Saw Dr. Rayann Heman earlier today and PPM interrogation showed that she did not having any recent AF. Doing well. Says she has good days and bad. Able to do most ADLs without problem Able to go to church. Mild dyspnea with exertion. No change. Continues with chronic dry cough. Using talking scale and weight at home has been stable at 230 usually but this week up to 235. Denies edema, orthopnea or PND. No bleeding with Eliquis. Taking torsemide 40/20.   Labs: 03/13/14: K+ 3.4, creatinine 2.10, BUN 16 04/02/14: K 5.6, creatinine 2.66, BUN 29 04/13/14: K 4.0, creatinine 2.66, K 5.6 06/20/14 K 3.7 creatinine 2.35  07/21/14 K 3.6 creatinine 2.55  ROS: All systems negative except as listed in HPI, PMH and Problem List.  SH:  History   Social History  . Marital Status: Widowed    Spouse Name: N/A    Number of Children: 3  . Years of Education: N/A   Occupational History  . Not on  file.   Social History Main Topics  . Smoking status: Former Research scientist (life sciences)  . Smokeless tobacco: Never Used     Comment: 05/2014  QUIT OVER 20 YEARS AGO "  . Alcohol Use: No  . Drug Use: No  . Sexual Activity: Not Currently   Other Topics Concern  . Not on file   Social History Narrative    FH:  Family History  Problem Relation Age of Onset  . Heart disease Mother   . Cancer Mother     bladder  . Kidney disease Mother   . Ovarian cancer Daughter   . Stomach cancer Maternal Uncle   . Colon cancer Maternal Aunt   . Esophageal cancer Neg Hx     Past Medical History  Diagnosis Date  . Atrial flutter     ablated by Dr Lovena Le in 2008  . Hypertension   . Diabetes mellitus   . Diastolic heart failure     a. EF 60-65%, RV nl (03/2014)  . Asthma   . Hyperlipidemia   . Fatty liver   . Esophageal dysmotility   . Arthritis   . Sleep apnea     wears CPAP  . Fatty tumor fatty tumor back  . Coronary atherosclerosis of native coronary artery   . Morbid obesity   . Myocardial infarction 2009  . Heart murmur   . Peripheral vascular disease   . GERD (gastroesophageal reflux disease)     barrets esophagus  . Anginal pain     occ; non-ischemic Lexiscan  09/2012  . Kidney disease     CKD stage IV (Dr. Erling Cruz)  . Complication of anesthesia     " DIFFICULTY BREATHING "  . Persistent atrial fibrillation     chads2vasc score of at least 5  . Sick sinus syndrome   . [name] esophagus   . Hiatal hernia   . Esophageal dysmotilities     Current Outpatient Prescriptions  Medication Sig Dispense Refill  . acetaminophen (TYLENOL) 500 MG tablet Take 500 mg by mouth every 6 (six) hours as needed for mild pain.    Marland Kitchen albuterol (PROVENTIL) (2.5 MG/3ML) 0.083% nebulizer solution Use four times a day as needed for shortness of breath or wheezing    . amiodarone (PACERONE) 200 MG tablet Take 1 tablet (200 mg total) by mouth daily. 30 tablet 3  . amitriptyline (ELAVIL) 25 MG tablet Take 25 mg  by mouth at bedtime.      Marland Kitchen apixaban (ELIQUIS) 5 MG TABS tablet Take 1 tablet (5 mg total) by mouth 2 (two) times daily. 60 tablet 2  . atorvastatin (LIPITOR) 40 MG tablet Take 1 tablet (40 mg total) by mouth daily. 90 tablet 3  . dicyclomine (BENTYL) 10 MG capsule Take 10 mg by mouth daily.     Marland Kitchen diltiazem (CARDIZEM CD) 180 MG 24 hr capsule Take 1 capsule (180 mg total) by mouth daily. 30 capsule 1  . esomeprazole (NEXIUM) 40 MG capsule Take 1 capsule (40 mg total) by mouth daily at 12 noon. 30 capsule 0  . febuxostat (ULORIC) 40 MG tablet Take 40 mg by mouth daily.     . hydrALAZINE (APRESOLINE) 25 MG tablet TAKE ONE TABLET BY MOUTH THREE TIMES DAILY 90 tablet 0  . HYDROcodone-acetaminophen (NORCO/VICODIN) 5-325 MG per tablet Take 1 tablet by mouth every 6 (six) hours as needed. Pain    . insulin glargine (LANTUS) 100 UNIT/ML injection Inject 0.2 mLs (20 Units total) into the skin at bedtime. (Patient taking differently: Inject 20 Units into the skin daily as needed (high blood sugar). ) 10 mL 2  . insulin lispro (HUMALOG) 100 UNIT/ML injection Inject 0.2 mLs (20 Units total) into the skin 3 (three) times daily after meals. (Patient taking differently: Inject 50 Units into the skin every morning. ) 10 mL 11  . Linaclotide (LINZESS) 145 MCG CAPS capsule Take 145 mcg by mouth daily.     . metolazone (ZAROXOLYN) 2.5 MG tablet Take 1 tablet by mouth daily as needed. Fluid    . metoprolol tartrate (LOPRESSOR) 25 MG tablet Take 3 tablets (75 mg total) by mouth 2 (two) times daily. 180 tablet 3  . nitroGLYCERIN (NITROSTAT) 0.4 MG SL tablet Place 0.4 mg under the tongue every 5 (five) minutes as needed for chest pain.     . potassium chloride SA (K-DUR,KLOR-CON) 20 MEQ tablet Take 1 tablet (20 mEq total) by mouth 2 (two) times daily. 75 tablet 6  . ranitidine (ZANTAC) 150 MG tablet Take 1 tablet (150 mg total) by mouth at bedtime. 30 tablet 0  . torsemide (DEMADEX) 20 MG tablet Take 1-2 tablets (20-40 mg  total) by mouth 2 (two) times daily. Take 40 mg a.m., 20 mg p.m. 90 tablet 3  . Vitamin D, Ergocalciferol, (DRISDOL) 50000 UNITS CAPS capsule Take 50,000 Units by mouth every Monday.    Penne Lash HFA 45 MCG/ACT inhaler Inhale 2 puffs into the lungs every 4 (four) hours as needed for wheezing or shortness of breath. 1 Inhaler 1  .  zolpidem (AMBIEN) 10 MG tablet Take 10 mg by mouth at bedtime.      No current facility-administered medications for this encounter.    Filed Vitals:   10/05/14 1521  BP: 106/62  Pulse: 59  Weight: 236 lb (107.049 kg)  SpO2: 97%    PHYSICAL EXAM: General: Obese, NAD, Ambulated in the clinic with a cane. Daughter present HEENT: normal  Neck: supple. JVP hard to see d/t body habitus but does not appear elevated; Carotids 2+ bilat; no bruits. No lymphadenopathy or thryomegaly appreciate Cor: PMI nonpalpbale. Distant. Regular rate. No obvious murmur  Lungs:  Lungs clear Abdomen: Obese soft, nontender. Non-distended. No hepatosplenomegaly. No bruits or masses. Good bowel sounds.  Extremities: no cyanosis, clubbing, rash, warm. Trace-1+ edema Neuro: alert & orientedx3, cranial nerves grossly intact. moves all 4 extremities w/o difficulty. Affect pleasant    ASSESSMENT & PLAN:  1) Chronic diastolic HF: EF 123456 (0000000) - Doing very well. NYHA II symptoms. Volume status looks good despite weight gain. Continue torsemide 40 mg/20 mg daily. If weight hits 235 or greater at home can take torsemide 40/40 with extra dose of kcl.  - Reinforced the need and importance of daily weights, a low sodium diet, and fluid restriction (less than 2 L a day). Instructed to call the HF clinic if weight increases more than 3 lbs overnight or 5 lbs in a week.  2) Afib/Aflutter  - Maintaining NSR. Remains on amiodarone, cardizem,  and lopressor. No bleeding problems.  PPM in place. Check LFTs and thyroid. Goes for yearly eye exams. - Continue Eliquis 5 mg BID. 3) CKD stage IV -  Stable. Baseline Cr 2.1-2.5. Recheck BMET today.  4) HTN - Stable. Continue current regimen.  5) Tachy/Brady syndrome. 06/18/14  S/P PPM.   F/u 3 months  [name] MD  3:45 PM

## 2014-10-06 LAB — T4, FREE: Free T4: 1.22 ng/dL (ref 0.80–1.80)

## 2014-10-07 ENCOUNTER — Telehealth (HOSPITAL_COMMUNITY): Payer: Self-pay | Admitting: *Deleted

## 2014-10-07 MED ORDER — TORSEMIDE 20 MG PO TABS: 40.0000 mg | ORAL_TABLET | Freq: Every day | ORAL | Status: DC

## 2014-10-07 NOTE — Telephone Encounter (Signed)
Pt aware, bmet 2/10

## 2014-10-07 NOTE — Telephone Encounter (Signed)
-----   Message from Jolaine Artist, MD sent at 10/05/2014  9:41 PM EST ----- Stop demadex for 2 days. Resume on Thursday at 40mg  daily. Do not take extra diuretics unless weight 240 or greater. Give kcl 60 extra tomorrow. Repeat BMET next week.

## 2014-10-09 ENCOUNTER — Telehealth (HOSPITAL_COMMUNITY): Payer: Self-pay | Admitting: Vascular Surgery

## 2014-10-09 NOTE — Telephone Encounter (Signed)
Pt Metoprolol 25 mg , PT is taking 3 potassium pills because potassium was low she want to know if she still needs to take that dosage she is extremly tired all the time please advise

## 2014-10-12 ENCOUNTER — Telehealth (HOSPITAL_COMMUNITY): Payer: Self-pay | Admitting: Vascular Surgery

## 2014-10-12 MED ORDER — METOPROLOL TARTRATE 25 MG PO TABS: 75.0000 mg | ORAL_TABLET | Freq: Two times a day (BID) | ORAL | Status: DC

## 2014-10-12 NOTE — Telephone Encounter (Signed)
Spoke w/pt, she is aware refill was sent in and pt is taking Torsemide 40 mg daily which is the correct dose

## 2014-10-12 NOTE — Telephone Encounter (Signed)
Pt called refill metoprolol 25 mg, Nurse called last Wed told pt to  stop taking Torsemide for 3 days pt wants to know if she needs to go back to regular dosage.. Please advise

## 2014-10-13 NOTE — Telephone Encounter (Signed)
Addressed by other means  see phone note dated 10/12/14

## 2014-10-14 ENCOUNTER — Ambulatory Visit (HOSPITAL_COMMUNITY)
Admission: RE | Admit: 2014-10-14 | Discharge: 2014-10-14 | Disposition: A | Discharge status: Home / Self Care | Payer: Commercial Managed Care - HMO | Source: Ambulatory Visit | Attending: Adult Health | Admitting: Adult Health

## 2014-10-14 DIAGNOSIS — I5022 Chronic systolic (congestive) heart failure: Secondary | ICD-10-CM

## 2014-10-14 DIAGNOSIS — I5032 Chronic diastolic (congestive) heart failure: Secondary | ICD-10-CM | POA: Diagnosis present

## 2014-10-14 LAB — BASIC METABOLIC PANEL
Anion gap: 11 (ref 5–15)
BUN: 32 mg/dL — ABNORMAL HIGH (ref 6–23)
CO2: 25 mmol/L (ref 19–32)
Calcium: 9.5 mg/dL (ref 8.4–10.5)
Chloride: 103 mmol/L (ref 96–112)
Creatinine, Ser: 3.13 mg/dL — ABNORMAL HIGH (ref 0.50–1.10)
GFR calc Af Amer: 16 mL/min — ABNORMAL LOW (ref >90)
GFR calc non Af Amer: 14 mL/min — ABNORMAL LOW (ref >90)
Glucose, Bld: 155 mg/dL — ABNORMAL HIGH (ref 70–99)
Potassium: 3.2 mmol/L — ABNORMAL LOW (ref 3.5–5.1)
Sodium: 139 mmol/L (ref 135–145)

## 2014-10-20 ENCOUNTER — Telehealth (HOSPITAL_COMMUNITY): Payer: Self-pay | Admitting: *Deleted

## 2014-10-20 DIAGNOSIS — I5032 Chronic diastolic (congestive) heart failure: Secondary | ICD-10-CM

## 2014-10-20 MED ORDER — POTASSIUM CHLORIDE CRYS ER 20 MEQ PO TBCR: EXTENDED_RELEASE_TABLET | ORAL | Status: DC

## 2014-10-20 NOTE — Telephone Encounter (Signed)
-----   Message from Jolaine Artist, MD sent at 10/18/2014  1:16 PM EST ----- Increase kcl to 40/20. Repeat 1 week.

## 2014-10-20 NOTE — Telephone Encounter (Signed)
Pt's daughter aware.

## 2014-10-28 ENCOUNTER — Ambulatory Visit (HOSPITAL_COMMUNITY)
Admission: RE | Admit: 2014-10-28 | Discharge: 2014-10-28 | Disposition: A | Discharge status: Home / Self Care | Payer: Commercial Managed Care - HMO | Source: Ambulatory Visit | Attending: Adult Health | Admitting: Adult Health

## 2014-10-28 DIAGNOSIS — I4891 Unspecified atrial fibrillation: Secondary | ICD-10-CM

## 2014-10-28 DIAGNOSIS — I5032 Chronic diastolic (congestive) heart failure: Secondary | ICD-10-CM | POA: Diagnosis not present

## 2014-10-28 DIAGNOSIS — I5022 Chronic systolic (congestive) heart failure: Secondary | ICD-10-CM

## 2014-10-28 LAB — BASIC METABOLIC PANEL
Anion gap: 10 (ref 5–15)
BUN: 34 mg/dL — ABNORMAL HIGH (ref 6–23)
CO2: 25 mmol/L (ref 19–32)
Calcium: 9.2 mg/dL (ref 8.4–10.5)
Chloride: 105 mmol/L (ref 96–112)
Creatinine, Ser: 3.03 mg/dL — ABNORMAL HIGH (ref 0.50–1.10)
GFR calc Af Amer: 17 mL/min — ABNORMAL LOW (ref >90)
GFR calc non Af Amer: 15 mL/min — ABNORMAL LOW (ref >90)
Glucose, Bld: 180 mg/dL — ABNORMAL HIGH (ref 70–99)
Potassium: 3.8 mmol/L (ref 3.5–5.1)
Sodium: 140 mmol/L (ref 135–145)

## 2014-10-28 MED ORDER — HYDRALAZINE HCL 25 MG PO TABS: 25.0000 mg | ORAL_TABLET | Freq: Three times a day (TID) | ORAL | Status: DC

## 2014-10-28 MED ORDER — APIXABAN 5 MG PO TABS: 5.0000 mg | ORAL_TABLET | Freq: Two times a day (BID) | ORAL | Status: DC

## 2014-11-23 ENCOUNTER — Other Ambulatory Visit: Payer: Self-pay | Admitting: *Deleted

## 2014-11-23 ENCOUNTER — Encounter: Payer: Self-pay | Admitting: Emergency Medicine

## 2014-11-23 NOTE — Patient Outreach (Signed)
   11/23/2014   [name] [birth year] [id number]  RN spoke with pt today and confirmed she cont. to manage her HF with daily weights remaining in the GREEN zone with no major events.  Pt confirms she cont. to take her prescribed medications concerning her Torsemide and Potassium as last instructed by her providers with no recent changes in the dosages. Pt was receptive to a home visit that will be scheduled for this month (March 30). Will re-evaluate plan of care and goals at that time.  Raina Mina, RN Care Management Coordinator [facility] Main Office [phone]

## 2014-11-30 ENCOUNTER — Other Ambulatory Visit: Payer: Self-pay | Admitting: Physician Assistant

## 2014-11-30 ENCOUNTER — Other Ambulatory Visit (HOSPITAL_COMMUNITY): Payer: Self-pay | Admitting: Anesthesiology

## 2014-12-01 ENCOUNTER — Telehealth (HOSPITAL_COMMUNITY): Payer: Self-pay | Admitting: Vascular Surgery

## 2014-12-01 ENCOUNTER — Other Ambulatory Visit (HOSPITAL_COMMUNITY): Payer: Self-pay | Admitting: Anesthesiology

## 2014-12-01 DIAGNOSIS — I5022 Chronic systolic (congestive) heart failure: Secondary | ICD-10-CM

## 2014-12-01 MED ORDER — DILTIAZEM HCL ER COATED BEADS 180 MG PO CP24: 180.0000 mg | ORAL_CAPSULE | Freq: Every day | ORAL | Status: DC

## 2014-12-01 MED ORDER — TORSEMIDE 20 MG PO TABS: 40.0000 mg | ORAL_TABLET | Freq: Every day | ORAL | Status: DC

## 2014-12-01 NOTE — Telephone Encounter (Addendum)
pb has no more refills Torsemide , Cardizem sent to [facility].. Please advise

## 2014-12-02 ENCOUNTER — Other Ambulatory Visit: Payer: Self-pay | Admitting: *Deleted

## 2014-12-07 NOTE — Patient Outreach (Unsigned)
[facility]) Care Management   12/07/2014  [name] December 30, 1945 [id number]  [name] is an 69 y.o. female  Subjective:  HF: Pt reports she continues to manager her care well however does not document her daily weights. States she remains in the GREEN zone and continues to eat health food items with minimal sodium intake but continues to fluctuate off and on indicating one day last week she gain a weight max of 239 lbs however has standing orders to take an extra fluid pill which resolved her weight issues in a short period of time.   O2: Reports she does not take her portable oxygen tanks on her outings outside the apartment but aware that she needs to have them available at all times.  NUTRITION: Pt states she continues to attempt to eat healthy food items with the help of her daughter.    Objective:   Review of Systems  Constitutional: Negative.   HENT: Negative.   Eyes: Negative.   Respiratory: Negative.   Cardiovascular: Negative.   Gastrointestinal: Negative.   Genitourinary: Negative.   Musculoskeletal: Negative.   Skin: Negative.   Neurological: Negative.   Endo/Heme/Allergies: Negative.   Psychiatric/Behavioral: Negative.   All other systems reviewed and are negative.   Physical Exam  Constitutional: She is oriented to person, place, and time. She appears well-developed and well-nourished.  HENT:  Right Ear: External ear normal.  Left Ear: External ear normal.  Nose: Nose normal.  Neck: Normal range of motion.  Cardiovascular: Normal rate, regular rhythm and normal heart sounds.   Respiratory: Effort normal and breath sounds normal.  GI: Soft. Bowel sounds are normal.  Musculoskeletal: Normal range of motion.  Neurological: She is alert and oriented to person, place, and time. She has normal reflexes.  Skin: Skin is warm and dry.  Psychiatric: She has a normal mood and affect. Her behavior is normal. Judgment and thought content normal.     Current Medications:   Current Outpatient Prescriptions  Medication Sig Dispense Refill  . acetaminophen (TYLENOL) 500 MG tablet Take 500 mg by mouth every 6 (six) hours as needed for mild pain.    Marland Kitchen albuterol (PROVENTIL) (2.5 MG/3ML) 0.083% nebulizer solution Use four times a day as needed for shortness of breath or wheezing    . amiodarone (PACERONE) 200 MG tablet Take 1 tablet (200 mg total) by mouth daily. 30 tablet 3  . amitriptyline (ELAVIL) 25 MG tablet Take 25 mg by mouth at bedtime.      Marland Kitchen apixaban (ELIQUIS) 5 MG TABS tablet Take 1 tablet (5 mg total) by mouth 2 (two) times daily. 60 tablet 6  . atorvastatin (LIPITOR) 40 MG tablet Take 1 tablet (40 mg total) by mouth daily. 90 tablet 3  . CARTIA XT 180 MG 24 hr capsule TAKE ONE CAPSULE BY MOUTH ONCE DAILY 30 capsule 0  . CARTIA XT 180 MG 24 hr capsule TAKE ONE CAPSULE BY MOUTH ONCE DAILY 30 capsule 6  . dicyclomine (BENTYL) 10 MG capsule Take 10 mg by mouth daily.     Marland Kitchen diltiazem (CARDIZEM CD) 180 MG 24 hr capsule Take 1 capsule (180 mg total) by mouth daily. 30 capsule 3  . esomeprazole (NEXIUM) 40 MG capsule Take 1 capsule (40 mg total) by mouth daily at 12 noon. 30 capsule 0  . febuxostat (ULORIC) 40 MG tablet Take 40 mg by mouth daily.     . hydrALAZINE (APRESOLINE) 25 MG tablet Take 1 tablet (25  mg total) by mouth 3 (three) times daily. 90 tablet 6  . HYDROcodone-acetaminophen (NORCO/VICODIN) 5-325 MG per tablet Take 1 tablet by mouth every 6 (six) hours as needed. Pain    . insulin glargine (LANTUS) 100 UNIT/ML injection Inject 0.2 mLs (20 Units total) into the skin at bedtime. (Patient taking differently: Inject 20 Units into the skin daily as needed (high blood sugar). ) 10 mL 2  . insulin lispro (HUMALOG) 100 UNIT/ML injection Inject 0.2 mLs (20 Units total) into the skin 3 (three) times daily after meals. (Patient taking differently: Inject 50 Units into the skin every morning. ) 10 mL 11  . Linaclotide (LINZESS) 145 MCG  CAPS capsule Take 145 mcg by mouth daily.     . metolazone (ZAROXOLYN) 2.5 MG tablet Take 1 tablet by mouth daily as needed. Fluid    . metoprolol tartrate (LOPRESSOR) 25 MG tablet Take 3 tablets (75 mg total) by mouth 2 (two) times daily. 180 tablet 3  . nitroGLYCERIN (NITROSTAT) 0.4 MG SL tablet Place 0.4 mg under the tongue every 5 (five) minutes as needed for chest pain.     . potassium chloride SA (K-DUR,KLOR-CON) 20 MEQ tablet Take 2 tabs in AM and 1 tab in PM 75 tablet 6  . ranitidine (ZANTAC) 150 MG tablet Take 1 tablet (150 mg total) by mouth at bedtime. 30 tablet 0  . torsemide (DEMADEX) 20 MG tablet TAKE ONE TO TWO TABLETS BY MOUTH TWICE DAILY TAKE  40  MG  IN  THE  MORNING  AND  20  MG  IN  THE  EVENING 90 tablet 2  . Vitamin D, Ergocalciferol, (DRISDOL) 50000 UNITS CAPS capsule Take 50,000 Units by mouth every Monday.    Penne Lash HFA 45 MCG/ACT inhaler Inhale 2 puffs into the lungs every 4 (four) hours as needed for wheezing or shortness of breath. 1 Inhaler 1  . zolpidem (AMBIEN) 10 MG tablet Take 10 mg by mouth at bedtime.      No current facility-administered medications for this visit.    Functional Status:   In your present state of health, do you have any difficulty performing the following activities: 06/16/2014 06/10/2014  Is the patient deaf or have difficulty hearing? N N  Hearing N N  Vision N N  Difficulty concentrating or making decisions Y Y  Walking or climbing stairs? N N  Doing errands, shopping? Tempie Donning    Fall/Depression Screening:    No flowsheet data found.  Assessment:   Ongoing case management related to HF Home O2 Follow up on nutritional issues related to healthy eating habits   Plan:  Physical assessment completed with no acute issues noted on today's assessment. Will confirm pt continues to manager her care with no acute episodes concerning her HF and pt remains in the GREEN zone with daily weights. Pt has not been document all weights for  providers viewing. Will strongly encouraged pt to document daily weights in her [facility] calendar for providers to view.  Will strongly encouraged pt to use her portable O2 tanks  on all outings to prevent exacerbation episodes with her respiratory issues ( pt states she has not been wearing her O2 on outings).   Will follow up on pt's dietary measures to assure pt has been consuming healthy food items related to her medical  issues DM/HF. Will continue to encouraged healthy eating habits and offer any community resources to assist such as [facility] delivery meals.  Plan to follow up  in one month with ongoing case management services and re-evaluate pt's plan of care and goals for possible weaning to telephonic disease management. This was discussed with pt if she continues to progress with self managing her care.    Raina Mina, RN Care Management Coordinator [facility] Main Office [phone]

## 2014-12-25 ENCOUNTER — Other Ambulatory Visit: Payer: Self-pay | Admitting: *Deleted

## 2014-12-25 ENCOUNTER — Ambulatory Visit: Payer: Self-pay | Admitting: *Deleted

## 2014-12-25 ENCOUNTER — Encounter: Payer: Self-pay | Admitting: *Deleted

## 2014-12-25 NOTE — Patient Outreach (Signed)
[facility]) Care Management   12/25/2014  [name] 1945-12-07 [name]:[id number]  [name] is an 69 y.o. female  Subjective:  Pt states she is document most of her daily weights but some she forgot to document. Pt states she has taken extra doses of her fluid pill based upon what has been allowed from her provider's recommendation. States she had issues with SOB/weight gain and visited her provider today due to ongoing productive cough and congestion. Pt states she was provided a new prescribed (Tussionex Pennkinetic ER) that she has not picked up from her pharmacy yet. Discussed pt's progress and discussed possible wean via [facility] program. Pt receptive to the weaning to a health coach however if continues to manager her care with no reported problems by next home visit pt also receptive to graduating via [facility] program and services with managing her care independently concerning her CHF.  Objective:   Review of Systems  Constitutional: Negative.   HENT: Negative.   Eyes: Negative.   Respiratory: Positive for cough, sputum production and shortness of breath.        Pt visited her provider and received additional medication. Pt started with an extra fluid pill (provider ordered) and Tussionex prescribed today.  Much improved on her symptoms.  Cardiovascular: Negative.   Gastrointestinal: Negative.   Genitourinary: Negative.   Musculoskeletal: Negative.   Skin: Negative.   Neurological: Negative.   Endo/Heme/Allergies: Negative.   Psychiatric/Behavioral: Negative.     Physical Exam  Constitutional: She is oriented to person, place, and time. She appears well-developed and well-nourished.  HENT:  Right Ear: External ear normal.  Left Ear: External ear normal.  Nose: Nose normal.  Neck: Normal range of motion.  Cardiovascular: Normal rate, regular rhythm and normal heart sounds.   Respiratory: Effort normal and breath sounds normal.  GI: Soft. Bowel sounds are normal.   Musculoskeletal: Normal range of motion.  Neurological: She is alert and oriented to person, place, and time.  Skin: Skin is warm and dry.  Psychiatric: She has a normal mood and affect. Her behavior is normal. Judgment and thought content normal.    Current Medications:   Current Outpatient Prescriptions  Medication Sig Dispense Refill  . acetaminophen (TYLENOL) 500 MG tablet Take 500 mg by mouth every 6 (six) hours as needed for mild pain.    Marland Kitchen albuterol (PROVENTIL) (2.5 MG/3ML) 0.083% nebulizer solution Use four times a day as needed for shortness of breath or wheezing    . amiodarone (PACERONE) 200 MG tablet Take 1 tablet (200 mg total) by mouth daily. 30 tablet 3  . amitriptyline (ELAVIL) 25 MG tablet Take 25 mg by mouth at bedtime.      Marland Kitchen apixaban (ELIQUIS) 5 MG TABS tablet Take 1 tablet (5 mg total) by mouth 2 (two) times daily. 60 tablet 6  . atorvastatin (LIPITOR) 40 MG tablet Take 1 tablet (40 mg total) by mouth daily. 90 tablet 3  . CARTIA XT 180 MG 24 hr capsule TAKE ONE CAPSULE BY MOUTH ONCE DAILY 30 capsule 0  . dicyclomine (BENTYL) 10 MG capsule Take 10 mg by mouth daily.     Marland Kitchen diltiazem (CARDIZEM CD) 180 MG 24 hr capsule Take 1 capsule (180 mg total) by mouth daily. 30 capsule 3  . esomeprazole (NEXIUM) 40 MG capsule Take 1 capsule (40 mg total) by mouth daily at 12 noon. 30 capsule 0  . febuxostat (ULORIC) 40 MG tablet Take 40 mg by mouth daily.     Marland Kitchen  hydrALAZINE (APRESOLINE) 25 MG tablet Take 1 tablet (25 mg total) by mouth 3 (three) times daily. 90 tablet 6  . HYDROcodone-acetaminophen (NORCO/VICODIN) 5-325 MG per tablet Take 1 tablet by mouth every 6 (six) hours as needed. Pain    . insulin glargine (LANTUS) 100 UNIT/ML injection Inject 0.2 mLs (20 Units total) into the skin at bedtime. (Patient taking differently: Inject 20 Units into the skin daily as needed (high blood sugar). ) 10 mL 2  . insulin lispro (HUMALOG) 100 UNIT/ML injection Inject 0.2 mLs (20 Units total)  into the skin 3 (three) times daily after meals. (Patient taking differently: Inject 50 Units into the skin every morning. ) 10 mL 11  . Linaclotide (LINZESS) 145 MCG CAPS capsule Take 145 mcg by mouth daily.     . metolazone (ZAROXOLYN) 2.5 MG tablet Take 1 tablet by mouth daily as needed. Fluid    . metoprolol tartrate (LOPRESSOR) 25 MG tablet Take 3 tablets (75 mg total) by mouth 2 (two) times daily. 180 tablet 3  . nitroGLYCERIN (NITROSTAT) 0.4 MG SL tablet Place 0.4 mg under the tongue every 5 (five) minutes as needed for chest pain.     . potassium chloride SA (K-DUR,KLOR-CON) 20 MEQ tablet Take 2 tabs in AM and 1 tab in PM 75 tablet 6  . ranitidine (ZANTAC) 150 MG tablet Take 1 tablet (150 mg total) by mouth at bedtime. 30 tablet 0  . torsemide (DEMADEX) 20 MG tablet TAKE ONE TO TWO TABLETS BY MOUTH TWICE DAILY TAKE  40  MG  IN  THE  MORNING  AND  20  MG  IN  THE  EVENING 90 tablet 2  . Vitamin D, Ergocalciferol, (DRISDOL) 50000 UNITS CAPS capsule Take 50,000 Units by mouth every Monday.    Penne Lash HFA 45 MCG/ACT inhaler Inhale 2 puffs into the lungs every 4 (four) hours as needed for wheezing or shortness of breath. 1 Inhaler 1  . zolpidem (AMBIEN) 10 MG tablet Take 10 mg by mouth at bedtime.     Marland Kitchen CARTIA XT 180 MG 24 hr capsule TAKE ONE CAPSULE BY MOUTH ONCE DAILY (Patient not taking: Reported on 12/25/2014) 30 capsule 6   No current facility-administered medications for this visit.    Functional Status:   In your present state of health, do you have any difficulty performing the following activities: 12/25/2014 12/02/2014  Hearing? Y N  Vision? N N  Difficulty concentrating or making decisions? N Y  Walking or climbing stairs? N N  Dressing or bathing? N N  Doing errands, shopping? N Y  Conservation officer, nature and eating ? N N  Using the Toilet? N N  In the past six months, have you accidently leaked urine? N N  Do you have problems with loss of bowel control? N N  Managing your  Medications? N N  Managing your Finances? N Y  Housekeeping or managing your Housekeeping? N Y    Fall/Depression Screening:    PHQ 2/9 Scores 12/25/2014 12/02/2014  PHQ - 2 Score 0 0    Assessment:   Ongoing case management related to congestive heart failure Home O2   Plan:  Physical assessment completed with no acute symptoms presented today. Note all prescribed medications reviewed and verified. Will verified pt's understanding of self management of care and strongly encouraged pt to document all daily weights for her providers. Will also stress the importance of contacting her providers with any participating symptoms early to prevent further exacerbated symptoms  of HF. Will review pt's knowledge based concerning teach-back method on the subject of congestive heart failure.  Will strongly encourage pt to wear her portable oxygen tanks with all outings and to check settings daily for the prescribed liters (3 liters).  Discussed possible discharge next month to wean pt to a health coach if she continues to self manager her care based upon the goals that have been set and discussed today. RN review the plan of care and will follow up in one month with ongoing case management services. Verified no additional community resources are needed.  Raina Mina, RN Care Management Coordinator [facility] Main Office [phone]

## 2015-01-04 ENCOUNTER — Encounter: Payer: Self-pay | Admitting: Internal Medicine

## 2015-01-04 ENCOUNTER — Telehealth: Payer: Self-pay | Admitting: Internal Medicine

## 2015-01-04 ENCOUNTER — Ambulatory Visit (INDEPENDENT_AMBULATORY_CARE_PROVIDER_SITE_OTHER): Payer: Commercial Managed Care - HMO | Admitting: *Deleted

## 2015-01-04 DIAGNOSIS — I495 Sick sinus syndrome: Secondary | ICD-10-CM | POA: Diagnosis not present

## 2015-01-04 NOTE — Telephone Encounter (Signed)
Attempted to return call no answer and unable to leave a message.

## 2015-01-04 NOTE — Progress Notes (Signed)
Remote pacemaker transmission.   

## 2015-01-04 NOTE — Telephone Encounter (Signed)
°  1. Has your device fired? No  2. Is you device beeping? No  3. Are you experiencing draining or swelling at device site? No  4. Are you calling to see if we received your device transmission? No  5. Have you passed out? No  Pt calling needing help doing her remote check. Please call back and advise.

## 2015-01-04 NOTE — Telephone Encounter (Signed)
Follow Up  Pt following up w/ Device about remote check. Please call back and dsicuss.

## 2015-01-05 NOTE — Telephone Encounter (Signed)
LMOVM informing pt that transmission was received.  

## 2015-01-09 LAB — CUP PACEART REMOTE DEVICE CHECK
Battery Remaining Longevity: 125 mo
Battery Remaining Percentage: 95.5 %
Battery Voltage: 3.01 V
Brady Statistic AP VP Percent: 1 %
Brady Statistic AP VS Percent: 95 %
Brady Statistic AS VP Percent: 1 %
Brady Statistic AS VS Percent: 4.6 %
Brady Statistic RA Percent Paced: 95 %
Brady Statistic RV Percent Paced: 1 %
Date Time Interrogation Session: 20160502060015
Lead Channel Impedance Value: 540 Ohm
Lead Channel Impedance Value: 590 Ohm
Lead Channel Pacing Threshold Amplitude: 0.75 V
Lead Channel Pacing Threshold Amplitude: 1.5 V
Lead Channel Pacing Threshold Pulse Width: 0.4 ms
Lead Channel Pacing Threshold Pulse Width: 0.5 ms
Lead Channel Sensing Intrinsic Amplitude: 12 mV
Lead Channel Sensing Intrinsic Amplitude: 5 mV
Lead Channel Setting Pacing Amplitude: 1.75 V
Lead Channel Setting Pacing Amplitude: 1.75 V
Lead Channel Setting Pacing Pulse Width: 0.5 ms
Lead Channel Setting Sensing Sensitivity: 2 mV
Pulse Gen Model: 2240
Pulse Gen Serial Number: 7665001

## 2015-01-14 ENCOUNTER — Encounter: Payer: Self-pay | Admitting: Cardiology

## 2015-01-16 ENCOUNTER — Other Ambulatory Visit: Payer: Self-pay | Admitting: Adult Health

## 2015-01-18 ENCOUNTER — Ambulatory Visit (HOSPITAL_COMMUNITY)
Admission: RE | Admit: 2015-01-18 | Discharge: 2015-01-18 | Disposition: A | Discharge status: Home / Self Care | Payer: Commercial Managed Care - HMO | Source: Ambulatory Visit | Attending: Cardiology | Admitting: Cardiology

## 2015-01-18 ENCOUNTER — Telehealth (HOSPITAL_COMMUNITY): Payer: Self-pay

## 2015-01-18 ENCOUNTER — Ambulatory Visit (HOSPITAL_COMMUNITY)
Admission: RE | Admit: 2015-01-18 | Discharge: 2015-01-18 | Disposition: A | Discharge status: Home / Self Care | Payer: Commercial Managed Care - HMO | Source: Ambulatory Visit | Attending: Internal Medicine | Admitting: Internal Medicine

## 2015-01-18 VITALS — HR 58 | Wt 243.5 lb

## 2015-01-18 DIAGNOSIS — K589 Irritable bowel syndrome without diarrhea: Secondary | ICD-10-CM | POA: Diagnosis not present

## 2015-01-18 DIAGNOSIS — I5032 Chronic diastolic (congestive) heart failure: Secondary | ICD-10-CM | POA: Diagnosis not present

## 2015-01-18 DIAGNOSIS — I495 Sick sinus syndrome: Secondary | ICD-10-CM | POA: Insufficient documentation

## 2015-01-18 DIAGNOSIS — N184 Chronic kidney disease, stage 4 (severe): Secondary | ICD-10-CM | POA: Insufficient documentation

## 2015-01-18 DIAGNOSIS — J9621 Acute and chronic respiratory failure with hypoxia: Secondary | ICD-10-CM | POA: Diagnosis not present

## 2015-01-18 LAB — TSH: TSH: 4.53 u[IU]/mL — ABNORMAL HIGH (ref 0.350–4.500)

## 2015-01-18 LAB — COMPREHENSIVE METABOLIC PANEL
ALT: 18 U/L (ref 14–54)
AST: 21 U/L (ref 15–41)
Albumin: 3.8 g/dL (ref 3.5–5.0)
Alkaline Phosphatase: 60 U/L (ref 38–126)
Anion gap: 12 (ref 5–15)
BUN: 38 mg/dL — ABNORMAL HIGH (ref 6–20)
CO2: 28 mmol/L (ref 22–32)
Calcium: 9.6 mg/dL (ref 8.9–10.3)
Chloride: 100 mmol/L — ABNORMAL LOW (ref 101–111)
Creatinine, Ser: 3.37 mg/dL — ABNORMAL HIGH (ref 0.44–1.00)
GFR calc Af Amer: 15 mL/min — ABNORMAL LOW (ref >60)
GFR calc non Af Amer: 13 mL/min — ABNORMAL LOW (ref >60)
Glucose, Bld: 181 mg/dL — ABNORMAL HIGH (ref 65–99)
Potassium: 3.5 mmol/L (ref 3.5–5.1)
Sodium: 140 mmol/L (ref 135–145)
Total Bilirubin: 0.5 mg/dL (ref 0.3–1.2)
Total Protein: 7.6 g/dL (ref 6.5–8.1)

## 2015-01-18 LAB — BRAIN NATRIURETIC PEPTIDE: B Natriuretic Peptide: 147 pg/mL — ABNORMAL HIGH (ref 0.0–100.0)

## 2015-01-18 MED ORDER — POTASSIUM CHLORIDE CRYS ER 20 MEQ PO TBCR
EXTENDED_RELEASE_TABLET | ORAL | Status: DC
Start: 2015-01-18 — End: 2015-10-22

## 2015-01-18 MED ORDER — POTASSIUM CHLORIDE 20 MEQ PO PACK
20.0000 meq | PACK | Freq: Once | ORAL | Status: AC
  Administered 2015-01-18: 20 meq via ORAL

## 2015-01-18 MED ORDER — TORSEMIDE 20 MG PO TABS: 40.0000 mg | ORAL_TABLET | Freq: Two times a day (BID) | ORAL | Status: DC

## 2015-01-18 MED ORDER — FUROSEMIDE 10 MG/ML IJ SOLN
80.0000 mg | Freq: Once | INTRAMUSCULAR | Status: AC
  Administered 2015-01-18: 80 mg via INTRAVENOUS

## 2015-01-18 NOTE — Progress Notes (Signed)
Pt had 150 cc of urine out put

## 2015-01-18 NOTE — Patient Instructions (Signed)
HOLD YOUR TORSEMIDE DOSE ON 01/18/2015  CHANGE Torsemide to 40mg  (2 tabs) twice a day CHANGE Potassium to 60 MEQ (3 tabs) in the AM and 40 MEQ (2 tabs) in the PM  Labs today and again in 1 week (BMET)  Your physician recommends that you schedule a follow-up appointment in: 1 week  Do the following things EVERYDAY: 1) Weigh yourself in the morning before breakfast. Write it down and keep it in a log. 2) Take your medicines as prescribed 3) Eat low salt foods-Limit salt (sodium) to 2000 mg per day.  4) Stay as active as you can everyday 5) Limit all fluids for the day to less than 2 liters 6)

## 2015-01-18 NOTE — Progress Notes (Signed)
IV d/c'd catheter intact, instructions reviewed with pt and daughter, advised if not feeling better tomorrow to call us back

## 2015-01-18 NOTE — Telephone Encounter (Signed)
Patient called c/o edema, weight gain of 7-9 lbs over weekend, severe SOB (unable to ambulate to bathroom, unable to drive).  Advised per Dr. Aundra Dubin to come to clinic by 1:40 pm at the latest to be seen as she may need IV lasix or other intervention.  Patient states she is unable to drive and may not be able to get a ride here until 3:45 pm.  Advised if she cannot make it here by 1:40 pm to go to ED to be seen.  Aware and agreeable.  Renee Pain

## 2015-01-18 NOTE — Progress Notes (Addendum)
Patient ID: [name], female   DOB: Aug 06, 1946, 69 y.o.   MRN: [id number] Primary Cardiologist: Bensimhon PCP: Dr. Noah Delaine  Nephrologist: Dr. Florene Glen EP: Dr Rayann Heman   HPI: [name] is a 69 year old woman with hypertension, hyperlipidemia, bronchial asthma, diabetes mellitus, CKD stage IV (baseline cr ~2.5), atrial fibrillation and diastolic HF.  She was admitted in 4/15 for R TKR. That hospitalization complicated by a/c diastolic HF with respiratory distress requiring non-rebreather support. Weight on discharge was 226 pounds. ABG at that time 7.4/34/91/97%. Cr peaked at 2.9  Admitted 6/30-7/10/15 for SOB and CP following scheduled cardioversion. Found to have severe aspiration PNA and was intubated. She maintained SR for short period of time and then went back into Afib/Aflutter.  HR controlled on amio and diltiazem. Discharged to [facility] at a weight of 232 lbs.   Admitted 06/16/14 with symptomatic tachy/brady syndrome. 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 Discharge weight was 223 pounds.   Patient was seen for an acute visit today.  She has been getting more short of breath over the last few weeks.  Weight is up 7 lbs.  She is short of breath walking in her house (uses walker).  She feels weak.  No chest pain.  She has been taking torsemide 40 daily with occasional addition of 20 mg in the afternoon when she is particularly short of breath.    Pacemaker interrogated today: She is a-pacing, no atrial fibrillation.   ECG: a-paced, LVH with repolarization abnormality  Labs: 03/13/14: K+ 3.4, creatinine 2.10, BUN 16 04/02/14: K 5.6, creatinine 2.66, BUN 29 04/13/14: K 4.0, creatinine 2.66, K 5.6 06/20/14 K 3.7 creatinine 2.35  07/21/14 K 3.6 creatinine 2.55 2/16 K 3.8, creatinine 3.03, LFTs normal, TSH normal, hgb 12.8   ROS: All systems negative except as listed in HPI, PMH and Problem List.  SH:  History   Social History  . Marital Status: Widowed    Spouse Name: N/A  . Number of  Children: 3  . Years of Education: N/A   Occupational History  . Not on file.   Social History Main Topics  . Smoking status: Former Research scientist (life sciences)  . Smokeless tobacco: Never Used     Comment: 05/2014  QUIT OVER 20 YEARS AGO "  . Alcohol Use: No  . Drug Use: No  . Sexual Activity: Not Currently   Other Topics Concern  . Not on file   Social History Narrative    FH:  Family History  Problem Relation Age of Onset  . Heart disease Mother   . Cancer Mother     bladder  . Kidney disease Mother   . Ovarian cancer Daughter   . Stomach cancer Maternal Uncle   . Colon cancer Maternal Aunt   . Esophageal cancer Neg Hx     Past Medical History  Diagnosis Date  . Atrial flutter     ablated by Dr Lovena Le in 2008  . Hypertension   . Diabetes mellitus   . Diastolic heart failure     a. EF 60-65%, RV nl (03/2014)  . Asthma   . Hyperlipidemia   . Fatty liver   . Esophageal dysmotility   . Arthritis   . Sleep apnea     wears CPAP  . Fatty tumor fatty tumor back  . Coronary atherosclerosis of native coronary artery   . Morbid obesity   . Myocardial infarction 2009  . Heart murmur   . Peripheral vascular disease   . GERD (gastroesophageal reflux  disease)     barrets esophagus  . Anginal pain     occ; non-ischemic Lexiscan 09/2012  . Kidney disease     CKD stage IV (Dr. Erling Cruz)  . Complication of anesthesia     " DIFFICULTY BREATHING "  . Persistent atrial fibrillation     chads2vasc score of at least 5  . Sick sinus syndrome   . [name] esophagus   . Hiatal hernia   . Esophageal dysmotilities     Current Outpatient Prescriptions  Medication Sig Dispense Refill  . acetaminophen (TYLENOL) 500 MG tablet Take 500 mg by mouth every 6 (six) hours as needed for mild pain.    Marland Kitchen albuterol (PROVENTIL) (2.5 MG/3ML) 0.083% nebulizer solution Use four times a day as needed for shortness of breath or wheezing    . amiodarone (PACERONE) 200 MG tablet Take 1 tablet (200 mg total) by  mouth daily. 30 tablet 3  . amitriptyline (ELAVIL) 25 MG tablet Take 25 mg by mouth at bedtime.      Marland Kitchen apixaban (ELIQUIS) 5 MG TABS tablet Take 1 tablet (5 mg total) by mouth 2 (two) times daily. 60 tablet 6  . atorvastatin (LIPITOR) 40 MG tablet Take 1 tablet (40 mg total) by mouth daily. 90 tablet 3  . dicyclomine (BENTYL) 10 MG capsule Take 10 mg by mouth daily.     Marland Kitchen diltiazem (CARDIZEM CD) 180 MG 24 hr capsule Take 1 capsule (180 mg total) by mouth daily. 30 capsule 3  . esomeprazole (NEXIUM) 40 MG capsule Take 1 capsule (40 mg total) by mouth daily at 12 noon. 30 capsule 0  . febuxostat (ULORIC) 40 MG tablet Take 40 mg by mouth daily.     . hydrALAZINE (APRESOLINE) 25 MG tablet Take 1 tablet (25 mg total) by mouth 3 (three) times daily. 90 tablet 6  . HYDROcodone-acetaminophen (NORCO/VICODIN) 5-325 MG per tablet Take 1 tablet by mouth every 6 (six) hours as needed. Pain    . insulin glargine (LANTUS) 100 UNIT/ML injection Inject 0.2 mLs (20 Units total) into the skin at bedtime. (Patient taking differently: Inject 20 Units into the skin daily as needed (high blood sugar). ) 10 mL 2  . insulin lispro (HUMALOG) 100 UNIT/ML injection Inject 0.2 mLs (20 Units total) into the skin 3 (three) times daily after meals. (Patient taking differently: Inject 50 Units into the skin every morning. ) 10 mL 11  . Linaclotide (LINZESS) 145 MCG CAPS capsule Take 145 mcg by mouth daily.     . metolazone (ZAROXOLYN) 2.5 MG tablet Take 1 tablet by mouth daily as needed. Fluid    . metoprolol tartrate (LOPRESSOR) 25 MG tablet Take 3 tablets (75 mg total) by mouth 2 (two) times daily. 180 tablet 3  . nitroGLYCERIN (NITROSTAT) 0.4 MG SL tablet Place 0.4 mg under the tongue every 5 (five) minutes as needed for chest pain.     . potassium chloride SA (K-DUR,KLOR-CON) 20 MEQ tablet Take 3 tabs in the AM and 2 tabs in the PM 150 tablet 2  . ranitidine (ZANTAC) 150 MG tablet Take 1 tablet (150 mg total) by mouth at  bedtime. 30 tablet 0  . torsemide (DEMADEX) 20 MG tablet Take 2 tablets (40 mg total) by mouth 2 (two) times daily. 120 tablet 2  . Vitamin D, Ergocalciferol, (DRISDOL) 50000 UNITS CAPS capsule Take 50,000 Units by mouth every Monday.    Penne Lash HFA 45 MCG/ACT inhaler Inhale 2 puffs into the lungs every  4 (four) hours as needed for wheezing or shortness of breath. 1 Inhaler 1  . zolpidem (AMBIEN) 10 MG tablet Take 10 mg by mouth at bedtime.      No current facility-administered medications for this encounter.    Filed Vitals:   01/18/15 1402  Pulse: 58  Weight: 243 lb 8 oz (110.451 kg)  SpO2: 98%    PHYSICAL EXAM: General: Obese, NAD, Ambulated in the clinic with a cane. Daughter present HEENT: normal  Neck: supple. JVP 12 cm; Carotids 2+ bilat; no bruits. No lymphadenopathy or thryomegaly appreciate Cor: PMI nonpalpbale. Distant. Regular rate. No obvious murmur  Lungs:  Lungs clear Abdomen: Obese soft, nontender. Non-distended. No hepatosplenomegaly. No bruits or masses. Good bowel sounds.  Extremities: no cyanosis, clubbing, rash, warm. Trace ankle edema bilaterally Neuro: alert & orientedx3, cranial nerves grossly intact. moves all 4 extremities w/o difficulty. Affect pleasant   ASSESSMENT & PLAN:  1) Chronic diastolic HF: EF 123456 (0000000 echo).  She has been more short of breath recently.  Weight is up and she is volume overloaded on exam.   - I will give her a dose of Lasix 80 mg IV x 1 in clinic now.  - Increase torsemide to 40 mg bid.  Increase KCl to 60 qam, 40 qpm.  - BMET/BNP today, repeat in 1 week.  2) Afib/Aflutter:  Remains out of atrial fibrillation. She is on amiodarone, cardizem, and lopressor. No bleeding problems on Eliquis.   - Amiodarone use: Check LFTs and TSH today. Goes for yearly eye exams. - Continue Eliquis 5 mg BID, check CBC. 3) CKD stage IV: Creatinine has been around 3 recently. BMET today.   4) Tachy/Brady syndrome: St Jude PPM.  She is pacing  her atrium today.    Followup in 1 week.   [name]  01/18/2015

## 2015-01-21 ENCOUNTER — Other Ambulatory Visit: Payer: Self-pay | Admitting: *Deleted

## 2015-01-22 ENCOUNTER — Encounter: Payer: Self-pay | Admitting: *Deleted

## 2015-01-22 ENCOUNTER — Other Ambulatory Visit: Payer: Self-pay | Admitting: *Deleted

## 2015-01-22 NOTE — Patient Outreach (Signed)
[facility]) Care Management   01/22/2015  [name] 03/01/1946 [id number]  [name] is an 69 y.o. female  Subjective:  HF: Pt reports she is doing well today and remains in the GREEN zone. States two weeks ago episodes with fluid however she quickly reacted and contacted her provider. Pt reports additional fluid medication was recommended and she continues to regain her dry weight. Pt states she is breathing well today and is using her home O2 both inside the home and her portable tanks on any outings. States she is taking all her medications as prescribed with no problems and continues to follow the advisement of her doctors.    RESOURCES: Pt reports she continues to receive mobile meals with no delays. Pt has been introduced to [employer] and has a contact number if needed in the future.  Pt receptive to discharge today due to all goals have been met with no additional needs based upon her plan of care.   Objective:  Pt wearing her home O2 today via nasal cannula. Review of Systems  All other systems reviewed and are negative.   Physical Exam  Constitutional: She is oriented to person, place, and time. She appears well-developed and well-nourished.  HENT:  Right Ear: External ear normal.  Left Ear: External ear normal.  Nose: Nose normal.  Neck: Normal range of motion.  Cardiovascular: Normal rate and normal heart sounds.   Respiratory: Effort normal and breath sounds normal.  GI: Soft. Bowel sounds are normal.  Musculoskeletal: Normal range of motion.  Neurological: She is alert and oriented to person, place, and time.  Skin: Skin is warm and dry.  Psychiatric: She has a normal mood and affect. Her behavior is normal. Judgment and thought content normal.    Current Medications:   Current Outpatient Prescriptions  Medication Sig Dispense Refill  . acetaminophen (TYLENOL) 500 MG tablet Take 500 mg by mouth every 6 (six) hours as needed for mild  pain.    Marland Kitchen albuterol (PROVENTIL) (2.5 MG/3ML) 0.083% nebulizer solution Use four times a day as needed for shortness of breath or wheezing    . amiodarone (PACERONE) 200 MG tablet Take 1 tablet (200 mg total) by mouth daily. 30 tablet 3  . amitriptyline (ELAVIL) 25 MG tablet Take 25 mg by mouth at bedtime.      Marland Kitchen apixaban (ELIQUIS) 5 MG TABS tablet Take 1 tablet (5 mg total) by mouth 2 (two) times daily. 60 tablet 6  . atorvastatin (LIPITOR) 40 MG tablet Take 1 tablet (40 mg total) by mouth daily. 90 tablet 3  . dicyclomine (BENTYL) 10 MG capsule Take 10 mg by mouth daily.     Marland Kitchen diltiazem (CARDIZEM CD) 180 MG 24 hr capsule Take 1 capsule (180 mg total) by mouth daily. 30 capsule 3  . esomeprazole (NEXIUM) 40 MG capsule Take 1 capsule (40 mg total) by mouth daily at 12 noon. 30 capsule 0  . febuxostat (ULORIC) 40 MG tablet Take 40 mg by mouth daily.     . hydrALAZINE (APRESOLINE) 25 MG tablet Take 1 tablet (25 mg total) by mouth 3 (three) times daily. 90 tablet 6  . HYDROcodone-acetaminophen (NORCO/VICODIN) 5-325 MG per tablet Take 1 tablet by mouth every 6 (six) hours as needed. Pain    . insulin glargine (LANTUS) 100 UNIT/ML injection Inject 0.2 mLs (20 Units total) into the skin at bedtime. (Patient taking differently: Inject 20 Units into the skin daily as needed (high blood  sugar). ) 10 mL 2  . insulin lispro (HUMALOG) 100 UNIT/ML injection Inject 0.2 mLs (20 Units total) into the skin 3 (three) times daily after meals. (Patient taking differently: Inject 50 Units into the skin every morning. ) 10 mL 11  . Linaclotide (LINZESS) 145 MCG CAPS capsule Take 145 mcg by mouth daily.     . metolazone (ZAROXOLYN) 2.5 MG tablet Take 1 tablet by mouth daily as needed. Fluid    . metoprolol tartrate (LOPRESSOR) 25 MG tablet Take 3 tablets (75 mg total) by mouth 2 (two) times daily. 180 tablet 3  . nitroGLYCERIN (NITROSTAT) 0.4 MG SL tablet Place 0.4 mg under the tongue every 5 (five) minutes as needed for  chest pain.     . potassium chloride SA (K-DUR,KLOR-CON) 20 MEQ tablet Take 3 tabs in the AM and 2 tabs in the PM 150 tablet 2  . ranitidine (ZANTAC) 150 MG tablet Take 1 tablet (150 mg total) by mouth at bedtime. 30 tablet 0  . torsemide (DEMADEX) 20 MG tablet Take 2 tablets (40 mg total) by mouth 2 (two) times daily. 120 tablet 2  . Vitamin D, Ergocalciferol, (DRISDOL) 50000 UNITS CAPS capsule Take 50,000 Units by mouth every Monday.    Penne Lash HFA 45 MCG/ACT inhaler Inhale 2 puffs into the lungs every 4 (four) hours as needed for wheezing or shortness of breath. 1 Inhaler 1  . zolpidem (AMBIEN) 10 MG tablet Take 10 mg by mouth at bedtime.      No current facility-administered medications for this visit.    Functional Status:   In your present state of health, do you have any difficulty performing the following activities: 01/22/2015 12/25/2014  Hearing? N Y  Vision? N N  Difficulty concentrating or making decisions? N N  Walking or climbing stairs? N N  Dressing or bathing? N N  Doing errands, shopping? Y N  Preparing Food and eating ? N N  Using the Toilet? N N  In the past six months, have you accidently leaked urine? N N  Do you have problems with loss of bowel control? N N  Managing your Medications? N N  Managing your Finances? N N  Housekeeping or managing your Housekeeping? N N    Fall/Depression Screening:    PHQ 2/9 Scores 01/22/2015 12/25/2014 12/02/2014  PHQ - 2 Score 0 0 0    Assessment:  Discharge from [facility] program related to HF program Addition resources related to nutritional eating habits ([employer] meals)  Plan: Physical assessment completed with no acute symptoms or signs noted.  Verified pt remains in the GREEN zone with no additional issues other then the normal flucation of fluid medication when fluid retention occurs. Verified pt has has a incident that has been resolved with fluid retention over two weeks ago. Verified pt's knowledge on HF and what to do via  action plan. Also discussed plan of care and verified all goals have been met. Will continue to encouraged pt to complete her daily weights, review her zones daily and follow the action plan by contacting her provider or seeking medication attention when needed.  Reiterated on available resources with [employer] meals however pt continues to receives mobile meals. Offered any other resources at this time as pt continues to be alert and response quickly to any episodes of fluid retention and contact her provider for early intervention resulting in no hospitalizations this year. All goals met as pt continues to self manage her care with no delays.  Note pt also has her daughter who lives with her and continues to assist when needed Demetrius Charity). Based upon this success discharging pt from the [facility] program has been discussed last month and will take place today as pt has no other needs that need to be addressed at this time via [facility] services. Provider will be notified of pt's discharge.   Raina Mina, RN Care Management Coordinator [facility] Main Office [phone]

## 2015-01-25 ENCOUNTER — Other Ambulatory Visit (HOSPITAL_COMMUNITY): Payer: Self-pay | Admitting: Internal Medicine

## 2015-01-26 ENCOUNTER — Ambulatory Visit (HOSPITAL_COMMUNITY)
Admission: RE | Admit: 2015-01-26 | Discharge: 2015-01-26 | Disposition: A | Discharge status: Home / Self Care | Payer: Commercial Managed Care - HMO | Source: Ambulatory Visit | Attending: Cardiology | Admitting: Cardiology

## 2015-01-26 ENCOUNTER — Encounter (HOSPITAL_COMMUNITY): Payer: Self-pay

## 2015-01-26 VITALS — BP 134/65 | HR 59 | Resp 18 | Wt 235.0 lb

## 2015-01-26 DIAGNOSIS — I5032 Chronic diastolic (congestive) heart failure: Secondary | ICD-10-CM

## 2015-01-26 DIAGNOSIS — N184 Chronic kidney disease, stage 4 (severe): Secondary | ICD-10-CM | POA: Diagnosis not present

## 2015-01-26 DIAGNOSIS — G4733 Obstructive sleep apnea (adult) (pediatric): Secondary | ICD-10-CM

## 2015-01-26 DIAGNOSIS — Z7901 Long term (current) use of anticoagulants: Secondary | ICD-10-CM | POA: Diagnosis not present

## 2015-01-26 DIAGNOSIS — E669 Obesity, unspecified: Secondary | ICD-10-CM | POA: Insufficient documentation

## 2015-01-26 DIAGNOSIS — I252 Old myocardial infarction: Secondary | ICD-10-CM | POA: Diagnosis not present

## 2015-01-26 DIAGNOSIS — K224 Dyskinesia of esophagus: Secondary | ICD-10-CM | POA: Diagnosis not present

## 2015-01-26 DIAGNOSIS — K219 Gastro-esophageal reflux disease without esophagitis: Secondary | ICD-10-CM

## 2015-01-26 DIAGNOSIS — I4891 Unspecified atrial fibrillation: Secondary | ICD-10-CM | POA: Insufficient documentation

## 2015-01-26 DIAGNOSIS — G4730 Sleep apnea, unspecified: Secondary | ICD-10-CM | POA: Insufficient documentation

## 2015-01-26 DIAGNOSIS — E119 Type 2 diabetes mellitus without complications: Secondary | ICD-10-CM | POA: Diagnosis not present

## 2015-01-26 DIAGNOSIS — J45909 Unspecified asthma, uncomplicated: Secondary | ICD-10-CM | POA: Insufficient documentation

## 2015-01-26 DIAGNOSIS — Z79899 Other long term (current) drug therapy: Secondary | ICD-10-CM | POA: Insufficient documentation

## 2015-01-26 DIAGNOSIS — I129 Hypertensive chronic kidney disease with stage 1 through stage 4 chronic kidney disease, or unspecified chronic kidney disease: Secondary | ICD-10-CM | POA: Insufficient documentation

## 2015-01-26 DIAGNOSIS — Z87891 Personal history of nicotine dependence: Secondary | ICD-10-CM | POA: Insufficient documentation

## 2015-01-26 DIAGNOSIS — Z794 Long term (current) use of insulin: Secondary | ICD-10-CM | POA: Diagnosis not present

## 2015-01-26 DIAGNOSIS — I4892 Unspecified atrial flutter: Secondary | ICD-10-CM | POA: Diagnosis not present

## 2015-01-26 DIAGNOSIS — K2270 Barrett's esophagus without dysplasia: Secondary | ICD-10-CM

## 2015-01-26 DIAGNOSIS — I495 Sick sinus syndrome: Secondary | ICD-10-CM | POA: Diagnosis not present

## 2015-01-26 DIAGNOSIS — E785 Hyperlipidemia, unspecified: Secondary | ICD-10-CM | POA: Diagnosis not present

## 2015-01-26 DIAGNOSIS — I480 Paroxysmal atrial fibrillation: Secondary | ICD-10-CM

## 2015-01-26 LAB — BASIC METABOLIC PANEL
Anion gap: 14 (ref 5–15)
BUN: 51 mg/dL — ABNORMAL HIGH (ref 6–20)
CO2: 21 mmol/L — ABNORMAL LOW (ref 22–32)
Calcium: 9.3 mg/dL (ref 8.9–10.3)
Chloride: 101 mmol/L (ref 101–111)
Creatinine, Ser: 3.93 mg/dL — ABNORMAL HIGH (ref 0.44–1.00)
GFR calc Af Amer: 13 mL/min — ABNORMAL LOW (ref >60)
GFR calc non Af Amer: 11 mL/min — ABNORMAL LOW (ref >60)
Glucose, Bld: 175 mg/dL — ABNORMAL HIGH (ref 65–99)
Potassium: 3.6 mmol/L (ref 3.5–5.1)
Sodium: 136 mmol/L (ref 135–145)

## 2015-01-26 LAB — BRAIN NATRIURETIC PEPTIDE: B Natriuretic Peptide: 143.8 pg/mL — ABNORMAL HIGH (ref 0.0–100.0)

## 2015-01-26 LAB — T4, FREE: Free T4: 0.97 ng/dL (ref 0.61–1.12)

## 2015-01-26 LAB — TSH: TSH: 4.584 u[IU]/mL — ABNORMAL HIGH (ref 0.350–4.500)

## 2015-01-26 MED ORDER — RANITIDINE HCL 150 MG PO TABS: 150.0000 mg | ORAL_TABLET | Freq: Every day | ORAL | Status: DC

## 2015-01-26 MED ORDER — METOLAZONE 2.5 MG PO TABS: 2.5000 mg | ORAL_TABLET | ORAL | Status: DC

## 2015-01-26 NOTE — Patient Instructions (Signed)
Routine lab work today. We will notify you of any abnormal results, otherwise no news is good news.  DECREASE Metolazone "super pill" to once weekly on MONDAYS.  Referral to [facility]monology for CPAP adjustments. [address], [place] Phone: [phone]  Will schedule you for a Pulmonary Function Test at [facility].  Follow up 1 month.  Do the following things EVERYDAY: 1) Weigh yourself in the morning before breakfast. Write it down and keep it in a log. 2) Take your medicines as prescribed 3) Eat low salt foods-Limit salt (sodium) to 2000 mg per day.  4) Stay as active as you can everyday 5) Limit all fluids for the day to less than 2 liters

## 2015-01-27 LAB — T3, FREE: T3, Free: 1.8 pg/mL — ABNORMAL LOW (ref 2.0–4.4)

## 2015-01-27 NOTE — Addendum Note (Signed)
Encounter addended by: Larey Dresser, MD on: 01/27/2015 10:17 AM<BR>     Documentation filed: Notes Section

## 2015-01-27 NOTE — Progress Notes (Addendum)
Patient ID: [name], female   DOB: 1946-03-18, 69 y.o.   MRN: [id number] PCP: Dr. Noah Delaine  Nephrologist: Dr. Florene Glen EP: Dr Rayann Heman   HPI: [name] is a 69 year old woman with hypertension, hyperlipidemia, bronchial asthma, diabetes mellitus, CKD stage IV (baseline cr ~2.5), atrial fibrillation and diastolic HF.  She was admitted in 4/15 for R TKR. That hospitalization complicated by a/c diastolic HF with respiratory distress requiring non-rebreather support. Weight on discharge was 226 pounds. ABG at that time 7.4/34/91/97%. Cr peaked at 2.9  Admitted 6/30-7/10/15 for SOB and CP following scheduled cardioversion. Found to have severe aspiration PNA and was intubated. She maintained SR for short period of time and then went back into Afib/Aflutter.  HR controlled on amio and diltiazem. Discharged to [facility] at a weight of 232 lbs.   Admitted 06/16/14 with symptomatic tachy/brady syndrome. 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 Discharge weight was 223 pounds.   At last appointment, patient was short of breath with significant volume overload.  Today, weight is down 8 lbs.  She is taking torsemide 40 mg bid with metolazone twice a week.  She is still short of breath after walking "10 feet" but this is better overall. Still sleeping on 3 pillows.  +Cough.  Lightheaded only with coughing. Having trouble using her CPAP.  No edema now.    Labs: 03/13/14: K+ 3.4, creatinine 2.10, BUN 16 04/02/14: K 5.6, creatinine 2.66, BUN 29 04/13/14: K 4.0, creatinine 2.66, K 5.6 06/20/14 K 3.7 creatinine 2.35  07/21/14 K 3.6 creatinine 2.55 2/16 K 3.8, creatinine 3.03, LFTs normal, TSH normal, hgb 12.8  5/16 K 3.5, creatinine 3.37, LFTs normal, TSH elevated, BNP 147  ROS: All systems negative except as listed in HPI, PMH and Problem List.  SH:  History   Social History  . Marital Status: Widowed    Spouse Name: N/A  . Number of Children: 3  . Years of Education: N/A   Occupational History  . Not on  file.   Social History Main Topics  . Smoking status: Former Research scientist (life sciences)  . Smokeless tobacco: Never Used     Comment: 05/2014  QUIT OVER 20 YEARS AGO "  . Alcohol Use: No  . Drug Use: No  . Sexual Activity: Not Currently   Other Topics Concern  . Not on file   Social History Narrative    FH:  Family History  Problem Relation Age of Onset  . Heart disease Mother   . Cancer Mother     bladder  . Kidney disease Mother   . Ovarian cancer Daughter   . Stomach cancer Maternal Uncle   . Colon cancer Maternal Aunt   . Esophageal cancer Neg Hx     Past Medical History  Diagnosis Date  . Atrial flutter     ablated by Dr Lovena Le in 2008  . Hypertension   . Diabetes mellitus   . Diastolic heart failure     a. EF 60-65%, RV nl (03/2014)  . Asthma   . Hyperlipidemia   . Fatty liver   . Esophageal dysmotility   . Arthritis   . Sleep apnea     wears CPAP  . Fatty tumor fatty tumor back  . Coronary atherosclerosis of native coronary artery   . Morbid obesity   . Myocardial infarction 2009  . Heart murmur   . Peripheral vascular disease   . GERD (gastroesophageal reflux disease)     barrets esophagus  . Anginal pain  occ; non-ischemic Lexiscan 09/2012  . Kidney disease     CKD stage IV (Dr. Erling Cruz)  . Complication of anesthesia     " DIFFICULTY BREATHING "  . Persistent atrial fibrillation     chads2vasc score of at least 5  . Sick sinus syndrome   . [name] esophagus   . Hiatal hernia   . Esophageal dysmotilities     Current Outpatient Prescriptions  Medication Sig Dispense Refill  . acetaminophen (TYLENOL) 500 MG tablet Take 500 mg by mouth every 6 (six) hours as needed for mild pain.    Marland Kitchen albuterol (PROVENTIL) (2.5 MG/3ML) 0.083% nebulizer solution Use four times a day as needed for shortness of breath or wheezing    . amiodarone (PACERONE) 200 MG tablet Take 1 tablet (200 mg total) by mouth daily. 30 tablet 3  . amitriptyline (ELAVIL) 25 MG tablet Take 25 mg  by mouth at bedtime.      Marland Kitchen apixaban (ELIQUIS) 5 MG TABS tablet Take 1 tablet (5 mg total) by mouth 2 (two) times daily. 60 tablet 6  . atorvastatin (LIPITOR) 40 MG tablet Take 1 tablet (40 mg total) by mouth daily. 90 tablet 3  . dicyclomine (BENTYL) 10 MG capsule Take 10 mg by mouth daily.     Marland Kitchen diltiazem (CARDIZEM CD) 180 MG 24 hr capsule Take 1 capsule (180 mg total) by mouth daily. 30 capsule 3  . esomeprazole (NEXIUM) 40 MG capsule Take 1 capsule (40 mg total) by mouth daily at 12 noon. 30 capsule 0  . febuxostat (ULORIC) 40 MG tablet Take 40 mg by mouth daily.     . hydrALAZINE (APRESOLINE) 25 MG tablet Take 1 tablet (25 mg total) by mouth 3 (three) times daily. 90 tablet 6  . HYDROcodone-acetaminophen (NORCO/VICODIN) 5-325 MG per tablet Take 1 tablet by mouth every 6 (six) hours as needed. Pain    . insulin glargine (LANTUS) 100 UNIT/ML injection Inject 0.2 mLs (20 Units total) into the skin at bedtime. (Patient taking differently: Inject 20 Units into the skin daily as needed (high blood sugar). ) 10 mL 2  . insulin lispro (HUMALOG) 100 UNIT/ML injection Inject 0.2 mLs (20 Units total) into the skin 3 (three) times daily after meals. (Patient taking differently: Inject 50 Units into the skin every morning. ) 10 mL 11  . Linaclotide (LINZESS) 145 MCG CAPS capsule Take 145 mcg by mouth daily.     . metolazone (ZAROXOLYN) 2.5 MG tablet Take 1 tablet (2.5 mg total) by mouth once a week. MONDAYS 5 tablet 6  . metoprolol tartrate (LOPRESSOR) 25 MG tablet TAKE THREE TABLETS BY MOUTH TWICE DAILY 180 tablet 0  . nitroGLYCERIN (NITROSTAT) 0.4 MG SL tablet Place 0.4 mg under the tongue every 5 (five) minutes as needed for chest pain.     . potassium chloride SA (K-DUR,KLOR-CON) 20 MEQ tablet Take 3 tabs in the AM and 2 tabs in the PM 150 tablet 2  . ranitidine (ZANTAC) 150 MG tablet Take 1 tablet (150 mg total) by mouth at bedtime. 30 tablet 0  . torsemide (DEMADEX) 20 MG tablet Take 2 tablets (40 mg  total) by mouth 2 (two) times daily. 120 tablet 2  . Vitamin D, Ergocalciferol, (DRISDOL) 50000 UNITS CAPS capsule Take 50,000 Units by mouth every Monday.    Penne Lash HFA 45 MCG/ACT inhaler Inhale 2 puffs into the lungs every 4 (four) hours as needed for wheezing or shortness of breath. 1 Inhaler 1  .  zolpidem (AMBIEN) 10 MG tablet Take 10 mg by mouth at bedtime.      No current facility-administered medications for this encounter.    Filed Vitals:   01/26/15 1345  BP: 134/65  Pulse: 59  Resp: 18  Weight: 235 lb (106.595 kg)  SpO2: 96%    PHYSICAL EXAM: General: Obese, NAD, Ambulated in the clinic with a cane. Daughter present HEENT: normal  Neck: supple. JVP 7-8 cm; Carotids 2+ bilat; no bruits. No lymphadenopathy or thryomegaly appreciate Cor: PMI nonpalpbale. Distant. Regular rate. No obvious murmur  Lungs:  Lungs clear Abdomen: Obese soft, nontender. Non-distended. No hepatosplenomegaly. No bruits or masses. Good bowel sounds.  Extremities: no cyanosis, clubbing, rash, warm. No edema.  Neuro: alert & orientedx3, cranial nerves grossly intact. moves all 4 extremities w/o difficulty. Affect pleasant   ASSESSMENT & PLAN:  1) Chronic diastolic HF: EF 123456 (0000000 echo).  Weight is down 8 lbs and volume status looks much better.  However, still very symptomatic (NYHA class IIIb).  I am concerned that volume overload is not the only cause for her dyspnea.  She also has asthma.   - I will arrange for PFTs.  - Check BMET/BNP today.   - Continue torsemide 40 mg bid and KCl 60/40.  I will have her cut back on metolazone, take only 2.5 mg on Mondays.   2) Afib/Aflutter:  Remains out of atrial fibrillation. She is on amiodarone, Cardizem, and metoprolol. No bleeding problems on Eliquis.   - Amiodarone use: LFTs were normal recently but TSH high.  Need to repeat TSH, free T4, free T3. Goes for yearly eye exams. - Continue Eliquis 5 mg BID 3) CKD stage IV: I am concerned that creatinine  may be up some, she has been taking metolazone on her own (had only wanted her to increase torsemide at last appointment).  As above, cut back metolazone to once a week, may stop altogether.  4) Tachy/Brady syndrome: St Jude PPM.   5) OSA: She is having difficulty using CPAP.  I will refer her back to sleep medicine for an evaluation.    [name]  01/27/2015

## 2015-01-28 ENCOUNTER — Encounter: Payer: Self-pay | Admitting: Cardiology

## 2015-02-04 ENCOUNTER — Ambulatory Visit (HOSPITAL_COMMUNITY)
Admission: RE | Admit: 2015-02-04 | Discharge: 2015-02-04 | Disposition: A | Discharge status: Home / Self Care | Payer: Commercial Managed Care - HMO | Source: Ambulatory Visit | Attending: Cardiology | Admitting: Cardiology

## 2015-02-04 DIAGNOSIS — I5032 Chronic diastolic (congestive) heart failure: Secondary | ICD-10-CM | POA: Insufficient documentation

## 2015-02-04 LAB — PULMONARY FUNCTION TEST
DL/VA % pred: 53 %
DL/VA: 2.57 ml/min/mmHg/L
DLCO unc % pred: 28 %
DLCO unc: 6.8 ml/min/mmHg
FEF 25-75 Post: 3.17 L/sec
FEF 25-75 Pre: 1.62 L/sec
FEF2575-%Change-Post: 95 %
FEF2575-%Pred-Post: 182 %
FEF2575-%Pred-Pre: 93 %
FEV1-%Change-Post: 20 %
FEV1-%Pred-Post: 100 %
FEV1-%Pred-Pre: 83 %
FEV1-Post: 1.88 L
FEV1-Pre: 1.57 L
FEV1FVC-%Change-Post: 3 %
FEV1FVC-%Pred-Pre: 103 %
FEV6-%Change-Post: 16 %
FEV6-%Pred-Post: 97 %
FEV6-%Pred-Pre: 83 %
FEV6-Post: 2.26 L
FEV6-Pre: 1.94 L
FEV6FVC-%Pred-Post: 104 %
FEV6FVC-%Pred-Pre: 104 %
FVC-%Change-Post: 16 %
FVC-%Pred-Post: 93 %
FVC-%Pred-Pre: 80 %
FVC-Post: 2.26 L
FVC-Pre: 1.94 L
Post FEV1/FVC ratio: 83 %
Post FEV6/FVC ratio: 100 %
Pre FEV1/FVC ratio: 81 %
Pre FEV6/FVC Ratio: 100 %
RV % pred: 74 %
RV: 1.61 L
TLC % pred: 75 %
TLC: 3.79 L

## 2015-02-04 MED ORDER — ALBUTEROL SULFATE (2.5 MG/3ML) 0.083% IN NEBU
2.5000 mg | INHALATION_SOLUTION | Freq: Once | RESPIRATORY_TRACT | Status: AC
  Administered 2015-02-04: 2.5 mg via RESPIRATORY_TRACT

## 2015-02-04 NOTE — Patient Outreach (Signed)
[facility]) Care Management  02/04/2015  CLEMENTINE SOULLIERE 07-Mar-1946 [id number]   Notification from Raina Mina, RN to close case due to goals met with [place] management.  [name]. [place], [place] Management [place] Assistant Phone: [phone] Fax: [phone]

## 2015-02-11 ENCOUNTER — Encounter: Payer: Self-pay | Admitting: Internal Medicine

## 2015-02-16 ENCOUNTER — Emergency Department (HOSPITAL_COMMUNITY): Payer: Commercial Managed Care - HMO

## 2015-02-16 ENCOUNTER — Inpatient Hospital Stay (HOSPITAL_COMMUNITY)
Admission: EM | Admit: 2015-02-16 | Discharge: 2015-02-19 | DRG: 377 | Disposition: A | Discharge status: Home Health | Payer: Commercial Managed Care - HMO | Attending: Internal Medicine | Admitting: Internal Medicine

## 2015-02-16 ENCOUNTER — Encounter: Payer: Self-pay | Admitting: Pulmonary Disease

## 2015-02-16 ENCOUNTER — Ambulatory Visit (INDEPENDENT_AMBULATORY_CARE_PROVIDER_SITE_OTHER): Payer: Commercial Managed Care - HMO | Admitting: Pulmonary Disease

## 2015-02-16 ENCOUNTER — Telehealth: Payer: Self-pay | Admitting: Pulmonary Disease

## 2015-02-16 ENCOUNTER — Encounter (HOSPITAL_COMMUNITY): Payer: Self-pay | Admitting: *Deleted

## 2015-02-16 ENCOUNTER — Other Ambulatory Visit (INDEPENDENT_AMBULATORY_CARE_PROVIDER_SITE_OTHER): Payer: Commercial Managed Care - HMO

## 2015-02-16 VITALS — BP 128/68 | HR 59 | Ht 64.0 in | Wt 245.0 lb

## 2015-02-16 DIAGNOSIS — Z91048 Other nonmedicinal substance allergy status: Secondary | ICD-10-CM | POA: Diagnosis not present

## 2015-02-16 DIAGNOSIS — J849 Interstitial pulmonary disease, unspecified: Secondary | ICD-10-CM

## 2015-02-16 DIAGNOSIS — N184 Chronic kidney disease, stage 4 (severe): Secondary | ICD-10-CM | POA: Diagnosis not present

## 2015-02-16 DIAGNOSIS — Z885 Allergy status to narcotic agent status: Secondary | ICD-10-CM

## 2015-02-16 DIAGNOSIS — J9610 Chronic respiratory failure, unspecified whether with hypoxia or hypercapnia: Secondary | ICD-10-CM | POA: Diagnosis present

## 2015-02-16 DIAGNOSIS — E1165 Type 2 diabetes mellitus with hyperglycemia: Secondary | ICD-10-CM | POA: Diagnosis present

## 2015-02-16 DIAGNOSIS — I4891 Unspecified atrial fibrillation: Secondary | ICD-10-CM

## 2015-02-16 DIAGNOSIS — Z6841 Body Mass Index (BMI) 40.0 and over, adult: Secondary | ICD-10-CM

## 2015-02-16 DIAGNOSIS — J9621 Acute and chronic respiratory failure with hypoxia: Secondary | ICD-10-CM

## 2015-02-16 DIAGNOSIS — D649 Anemia, unspecified: Secondary | ICD-10-CM | POA: Diagnosis present

## 2015-02-16 DIAGNOSIS — D509 Iron deficiency anemia, unspecified: Secondary | ICD-10-CM | POA: Diagnosis present

## 2015-02-16 DIAGNOSIS — Z79891 Long term (current) use of opiate analgesic: Secondary | ICD-10-CM | POA: Diagnosis not present

## 2015-02-16 DIAGNOSIS — K648 Other hemorrhoids: Secondary | ICD-10-CM | POA: Diagnosis present

## 2015-02-16 DIAGNOSIS — I129 Hypertensive chronic kidney disease with stage 1 through stage 4 chronic kidney disease, or unspecified chronic kidney disease: Secondary | ICD-10-CM | POA: Diagnosis present

## 2015-02-16 DIAGNOSIS — K644 Residual hemorrhoidal skin tags: Secondary | ICD-10-CM | POA: Diagnosis present

## 2015-02-16 DIAGNOSIS — Z7901 Long term (current) use of anticoagulants: Secondary | ICD-10-CM | POA: Diagnosis not present

## 2015-02-16 DIAGNOSIS — Z955 Presence of coronary angioplasty implant and graft: Secondary | ICD-10-CM

## 2015-02-16 DIAGNOSIS — I252 Old myocardial infarction: Secondary | ICD-10-CM | POA: Diagnosis not present

## 2015-02-16 DIAGNOSIS — Z880 Allergy status to penicillin: Secondary | ICD-10-CM | POA: Diagnosis not present

## 2015-02-16 DIAGNOSIS — Z794 Long term (current) use of insulin: Secondary | ICD-10-CM

## 2015-02-16 DIAGNOSIS — E785 Hyperlipidemia, unspecified: Secondary | ICD-10-CM | POA: Diagnosis present

## 2015-02-16 DIAGNOSIS — K219 Gastro-esophageal reflux disease without esophagitis: Secondary | ICD-10-CM | POA: Diagnosis present

## 2015-02-16 DIAGNOSIS — G4733 Obstructive sleep apnea (adult) (pediatric): Secondary | ICD-10-CM | POA: Diagnosis present

## 2015-02-16 DIAGNOSIS — Z9989 Dependence on other enabling machines and devices: Secondary | ICD-10-CM

## 2015-02-16 DIAGNOSIS — I480 Paroxysmal atrial fibrillation: Secondary | ICD-10-CM | POA: Diagnosis present

## 2015-02-16 DIAGNOSIS — R0600 Dyspnea, unspecified: Secondary | ICD-10-CM | POA: Diagnosis not present

## 2015-02-16 DIAGNOSIS — R197 Diarrhea, unspecified: Secondary | ICD-10-CM

## 2015-02-16 DIAGNOSIS — I10 Essential (primary) hypertension: Secondary | ICD-10-CM

## 2015-02-16 DIAGNOSIS — K2270 Barrett's esophagus without dysplasia: Secondary | ICD-10-CM | POA: Diagnosis present

## 2015-02-16 DIAGNOSIS — D179 Benign lipomatous neoplasm, unspecified: Secondary | ICD-10-CM

## 2015-02-16 DIAGNOSIS — Z87891 Personal history of nicotine dependence: Secondary | ICD-10-CM | POA: Diagnosis not present

## 2015-02-16 DIAGNOSIS — Z96651 Presence of right artificial knee joint: Secondary | ICD-10-CM | POA: Diagnosis present

## 2015-02-16 DIAGNOSIS — Z79899 Other long term (current) drug therapy: Secondary | ICD-10-CM

## 2015-02-16 DIAGNOSIS — R109 Unspecified abdominal pain: Secondary | ICD-10-CM

## 2015-02-16 DIAGNOSIS — K5521 Angiodysplasia of colon with hemorrhage: Principal | ICD-10-CM | POA: Diagnosis present

## 2015-02-16 DIAGNOSIS — E1122 Type 2 diabetes mellitus with diabetic chronic kidney disease: Secondary | ICD-10-CM

## 2015-02-16 DIAGNOSIS — R7989 Other specified abnormal findings of blood chemistry: Secondary | ICD-10-CM

## 2015-02-16 DIAGNOSIS — K224 Dyskinesia of esophagus: Secondary | ICD-10-CM | POA: Diagnosis present

## 2015-02-16 DIAGNOSIS — K5900 Constipation, unspecified: Secondary | ICD-10-CM | POA: Diagnosis present

## 2015-02-16 DIAGNOSIS — Z950 Presence of cardiac pacemaker: Secondary | ICD-10-CM

## 2015-02-16 DIAGNOSIS — N185 Chronic kidney disease, stage 5: Secondary | ICD-10-CM | POA: Diagnosis present

## 2015-02-16 DIAGNOSIS — Z882 Allergy status to sulfonamides status: Secondary | ICD-10-CM

## 2015-02-16 DIAGNOSIS — J45909 Unspecified asthma, uncomplicated: Secondary | ICD-10-CM | POA: Diagnosis present

## 2015-02-16 DIAGNOSIS — I5032 Chronic diastolic (congestive) heart failure: Secondary | ICD-10-CM

## 2015-02-16 DIAGNOSIS — Q2733 Arteriovenous malformation of digestive system vessel: Secondary | ICD-10-CM | POA: Diagnosis not present

## 2015-02-16 DIAGNOSIS — Z9981 Dependence on supplemental oxygen: Secondary | ICD-10-CM

## 2015-02-16 DIAGNOSIS — I509 Heart failure, unspecified: Secondary | ICD-10-CM

## 2015-02-16 DIAGNOSIS — I495 Sick sinus syndrome: Secondary | ICD-10-CM

## 2015-02-16 DIAGNOSIS — I5033 Acute on chronic diastolic (congestive) heart failure: Secondary | ICD-10-CM | POA: Diagnosis present

## 2015-02-16 DIAGNOSIS — R079 Chest pain, unspecified: Secondary | ICD-10-CM | POA: Diagnosis not present

## 2015-02-16 DIAGNOSIS — K589 Irritable bowel syndrome without diarrhea: Secondary | ICD-10-CM

## 2015-02-16 HISTORY — DX: Type 2 diabetes mellitus without complications: E11.9

## 2015-02-16 HISTORY — DX: Reserved for inherently not codable concepts without codable children: IMO0001

## 2015-02-16 HISTORY — DX: Chronic kidney disease, unspecified: N18.9

## 2015-02-16 HISTORY — DX: Long term (current) use of insulin: Z79.4

## 2015-02-16 LAB — BASIC METABOLIC PANEL
Anion gap: 13 (ref 5–15)
BUN: 35 mg/dL — ABNORMAL HIGH (ref 6–20)
CO2: 18 mmol/L — ABNORMAL LOW (ref 22–32)
Calcium: 9.2 mg/dL (ref 8.9–10.3)
Chloride: 107 mmol/L (ref 101–111)
Creatinine, Ser: 3.41 mg/dL — ABNORMAL HIGH (ref 0.44–1.00)
GFR calc Af Amer: 15 mL/min — ABNORMAL LOW (ref >60)
GFR calc non Af Amer: 13 mL/min — ABNORMAL LOW (ref >60)
Glucose, Bld: 250 mg/dL — ABNORMAL HIGH (ref 65–99)
Potassium: 4.3 mmol/L (ref 3.5–5.1)
Sodium: 138 mmol/L (ref 135–145)

## 2015-02-16 LAB — COMPREHENSIVE METABOLIC PANEL
ALT: 12 U/L (ref 0–35)
AST: 14 U/L (ref 0–37)
Albumin: 4.2 g/dL (ref 3.5–5.2)
Alkaline Phosphatase: 67 U/L (ref 39–117)
BUN: 34 mg/dL — ABNORMAL HIGH (ref 6–23)
CO2: 25 mEq/L (ref 19–32)
Calcium: 9.5 mg/dL (ref 8.4–10.5)
Chloride: 107 mEq/L (ref 96–112)
Creatinine, Ser: 3.1 mg/dL — ABNORMAL HIGH (ref 0.40–1.20)
GFR: 19.17 mL/min — ABNORMAL LOW (ref >60.00)
Glucose, Bld: 204 mg/dL — ABNORMAL HIGH (ref 70–99)
Potassium: 4.3 mEq/L (ref 3.5–5.1)
Sodium: 139 mEq/L (ref 135–145)
Total Bilirubin: 0.4 mg/dL (ref 0.2–1.2)
Total Protein: 7.5 g/dL (ref 6.0–8.3)

## 2015-02-16 LAB — CBC WITH DIFFERENTIAL/PLATELET
Basophils Absolute: 0 10*3/uL (ref 0.0–0.1)
Basophils Relative: 0.3 % (ref 0.0–3.0)
Eosinophils Absolute: 0.4 10*3/uL (ref 0.0–0.7)
Eosinophils Relative: 4.3 % (ref 0.0–5.0)
HCT: 25 % — ABNORMAL LOW (ref 36.0–46.0)
Hemoglobin: 7.3 g/dL — CL (ref 12.0–15.0)
Lymphocytes Relative: 13.1 % (ref 12.0–46.0)
Lymphs Abs: 1.2 10*3/uL (ref 0.7–4.0)
MCHC: 29.4 g/dL — ABNORMAL LOW (ref 30.0–36.0)
MCV: 72.2 fl — ABNORMAL LOW (ref 78.0–100.0)
Monocytes Absolute: 0.9 10*3/uL (ref 0.1–1.0)
Monocytes Relative: 10.3 % (ref 3.0–12.0)
Neutro Abs: 6.4 10*3/uL (ref 1.4–7.7)
Neutrophils Relative %: 72 % (ref 43.0–77.0)
Platelets: 234 10*3/uL (ref 150.0–400.0)
RBC: 3.47 Mil/uL — ABNORMAL LOW (ref 3.87–5.11)
RDW: 19.4 % — ABNORMAL HIGH (ref 11.5–15.5)
WBC: 8.9 10*3/uL (ref 4.0–10.5)

## 2015-02-16 LAB — APTT: aPTT: 30 seconds (ref 24–37)

## 2015-02-16 LAB — CBC
HCT: 26.2 % — ABNORMAL LOW (ref 36.0–46.0)
Hemoglobin: 7.2 g/dL — ABNORMAL LOW (ref 12.0–15.0)
MCH: 21 pg — ABNORMAL LOW (ref 26.0–34.0)
MCHC: 27.5 g/dL — ABNORMAL LOW (ref 30.0–36.0)
MCV: 76.4 fL — ABNORMAL LOW (ref 78.0–100.0)
Platelets: 237 10*3/uL (ref 150–400)
RBC: 3.43 MIL/uL — ABNORMAL LOW (ref 3.87–5.11)
RDW: 18.4 % — ABNORMAL HIGH (ref 11.5–15.5)
WBC: 8.6 10*3/uL (ref 4.0–10.5)

## 2015-02-16 LAB — RHEUMATOID FACTOR: Rhuematoid fact SerPl-aCnc: 23 IU/mL — ABNORMAL HIGH (ref <=14)

## 2015-02-16 LAB — GLUCOSE, CAPILLARY: Glucose-Capillary: 201 mg/dL — ABNORMAL HIGH (ref 65–99)

## 2015-02-16 LAB — I-STAT TROPONIN, ED: Troponin i, poc: 0 ng/mL (ref 0.00–0.08)

## 2015-02-16 LAB — BRAIN NATRIURETIC PEPTIDE: B Natriuretic Peptide: 364.2 pg/mL — ABNORMAL HIGH (ref 0.0–100.0)

## 2015-02-16 LAB — PREPARE RBC (CROSSMATCH)

## 2015-02-16 LAB — PROTIME-INR
INR: 1.36 (ref 0.00–1.49)
Prothrombin Time: 16.9 seconds — ABNORMAL HIGH (ref 11.6–15.2)

## 2015-02-16 LAB — POC OCCULT BLOOD, ED: Fecal Occult Bld: POSITIVE — AB

## 2015-02-16 LAB — SEDIMENTATION RATE: Sed Rate: 48 mm/hr — ABNORMAL HIGH (ref 0–22)

## 2015-02-16 MED ORDER — METOLAZONE 2.5 MG PO TABS: 2.5000 mg | ORAL_TABLET | ORAL | Status: DC

## 2015-02-16 MED ORDER — SODIUM CHLORIDE 0.9 % IJ SOLN: 3.0000 mL | INTRAMUSCULAR | Status: DC | PRN

## 2015-02-16 MED ORDER — ATORVASTATIN CALCIUM 40 MG PO TABS
40.0000 mg | ORAL_TABLET | Freq: Every day | ORAL | Status: DC
  Administered 2015-02-17 – 2015-02-19 (×3): 40 mg via ORAL
  Filled 2015-02-16 (×3): qty 1

## 2015-02-16 MED ORDER — FEBUXOSTAT 40 MG PO TABS
40.0000 mg | ORAL_TABLET | Freq: Every day | ORAL | Status: DC
  Administered 2015-02-17 – 2015-02-19 (×3): 40 mg via ORAL
  Filled 2015-02-16 (×3): qty 1

## 2015-02-16 MED ORDER — ALBUTEROL SULFATE (2.5 MG/3ML) 0.083% IN NEBU: 2.5000 mg | INHALATION_SOLUTION | Freq: Four times a day (QID) | RESPIRATORY_TRACT | Status: DC | PRN

## 2015-02-16 MED ORDER — HYDROCODONE-ACETAMINOPHEN 5-325 MG PO TABS: 1.0000 | ORAL_TABLET | Freq: Four times a day (QID) | ORAL | Status: DC | PRN

## 2015-02-16 MED ORDER — PANTOPRAZOLE SODIUM 40 MG IV SOLR
40.0000 mg | INTRAVENOUS | Status: DC
  Administered 2015-02-16: 40 mg via INTRAVENOUS
  Filled 2015-02-16 (×2): qty 40

## 2015-02-16 MED ORDER — POTASSIUM CHLORIDE CRYS ER 20 MEQ PO TBCR
20.0000 meq | EXTENDED_RELEASE_TABLET | Freq: Two times a day (BID) | ORAL | Status: DC
  Administered 2015-02-16 – 2015-02-19 (×6): 20 meq via ORAL
  Filled 2015-02-16 (×8): qty 1

## 2015-02-16 MED ORDER — INSULIN GLARGINE 100 UNIT/ML ~~LOC~~ SOLN
10.0000 [IU] | Freq: Every day | SUBCUTANEOUS | Status: DC
  Administered 2015-02-16 – 2015-02-18 (×3): 10 [IU] via SUBCUTANEOUS
  Filled 2015-02-16 (×4): qty 0.1

## 2015-02-16 MED ORDER — INSULIN ASPART 100 UNIT/ML ~~LOC~~ SOLN
0.0000 [IU] | Freq: Three times a day (TID) | SUBCUTANEOUS | Status: DC
  Administered 2015-02-17: 8 [IU] via SUBCUTANEOUS
  Administered 2015-02-19: 3 [IU] via SUBCUTANEOUS
  Administered 2015-02-19: 2 [IU] via SUBCUTANEOUS

## 2015-02-16 MED ORDER — SODIUM CHLORIDE 0.9 % IV SOLN
Freq: Once | INTRAVENOUS | Status: AC
  Administered 2015-02-16: 21:00:00 via INTRAVENOUS

## 2015-02-16 MED ORDER — SODIUM CHLORIDE 0.9 % IJ SOLN
3.0000 mL | Freq: Two times a day (BID) | INTRAMUSCULAR | Status: DC
  Administered 2015-02-16 – 2015-02-19 (×6): 3 mL via INTRAVENOUS

## 2015-02-16 MED ORDER — INSULIN ASPART 100 UNIT/ML ~~LOC~~ SOLN
0.0000 [IU] | Freq: Every day | SUBCUTANEOUS | Status: DC
  Administered 2015-02-18: 2 [IU] via SUBCUTANEOUS

## 2015-02-16 MED ORDER — AMIODARONE HCL 200 MG PO TABS
200.0000 mg | ORAL_TABLET | Freq: Every day | ORAL | Status: DC
  Administered 2015-02-17 – 2015-02-19 (×3): 200 mg via ORAL
  Filled 2015-02-16 (×3): qty 1

## 2015-02-16 MED ORDER — FUROSEMIDE 10 MG/ML IJ SOLN
60.0000 mg | Freq: Two times a day (BID) | INTRAMUSCULAR | Status: DC
  Administered 2015-02-16 – 2015-02-18 (×5): 60 mg via INTRAVENOUS
  Filled 2015-02-16 (×8): qty 6

## 2015-02-16 MED ORDER — AMITRIPTYLINE HCL 25 MG PO TABS
25.0000 mg | ORAL_TABLET | Freq: Every day | ORAL | Status: DC
  Administered 2015-02-16 – 2015-02-18 (×3): 25 mg via ORAL
  Filled 2015-02-16 (×4): qty 1

## 2015-02-16 MED ORDER — FUROSEMIDE 10 MG/ML IJ SOLN
40.0000 mg | Freq: Once | INTRAMUSCULAR | Status: AC
  Administered 2015-02-16: 40 mg via INTRAVENOUS

## 2015-02-16 MED ORDER — CETYLPYRIDINIUM CHLORIDE 0.05 % MT LIQD
7.0000 mL | Freq: Two times a day (BID) | OROMUCOSAL | Status: DC
  Administered 2015-02-17 – 2015-02-19 (×5): 7 mL via OROMUCOSAL

## 2015-02-16 MED ORDER — METOPROLOL TARTRATE 50 MG PO TABS
75.0000 mg | ORAL_TABLET | Freq: Two times a day (BID) | ORAL | Status: DC
  Administered 2015-02-16 – 2015-02-19 (×6): 75 mg via ORAL
  Filled 2015-02-16 (×8): qty 1

## 2015-02-16 MED ORDER — ACETAMINOPHEN 325 MG PO TABS: 650.0000 mg | ORAL_TABLET | ORAL | Status: DC | PRN

## 2015-02-16 MED ORDER — SODIUM CHLORIDE 0.9 % IV SOLN: 250.0000 mL | INTRAVENOUS | Status: DC | PRN

## 2015-02-16 MED ORDER — ONDANSETRON HCL 4 MG/2ML IJ SOLN: 4.0000 mg | Freq: Four times a day (QID) | INTRAMUSCULAR | Status: DC | PRN

## 2015-02-16 MED ORDER — HYDRALAZINE HCL 25 MG PO TABS
25.0000 mg | ORAL_TABLET | Freq: Three times a day (TID) | ORAL | Status: DC
  Administered 2015-02-16 – 2015-02-19 (×8): 25 mg via ORAL
  Filled 2015-02-16 (×10): qty 1

## 2015-02-16 MED ORDER — FAMOTIDINE 20 MG PO TABS
20.0000 mg | ORAL_TABLET | Freq: Every day | ORAL | Status: DC
  Administered 2015-02-16 – 2015-02-18 (×3): 20 mg via ORAL
  Filled 2015-02-16 (×4): qty 1

## 2015-02-16 MED ORDER — DILTIAZEM HCL ER COATED BEADS 180 MG PO CP24
180.0000 mg | ORAL_CAPSULE | Freq: Every day | ORAL | Status: DC
  Administered 2015-02-17 – 2015-02-19 (×3): 180 mg via ORAL
  Filled 2015-02-16 (×3): qty 1

## 2015-02-16 MED ORDER — ZOLPIDEM TARTRATE 5 MG PO TABS
10.0000 mg | ORAL_TABLET | Freq: Every day | ORAL | Status: DC
  Administered 2015-02-16 – 2015-02-18 (×3): 10 mg via ORAL
  Filled 2015-02-16 (×3): qty 2

## 2015-02-16 NOTE — Patient Instructions (Addendum)
Lab tests today Will schedule CT chest Will get records from Dr. Melissa Noon office Will get copy of CPAP report Follow up with Dr. Halford Chessman or Tammy Parrett in 2 to 3 weeks

## 2015-02-16 NOTE — H&P (Signed)
History and Physical  GERA STATER [id number] DOB: [birth year] DOA: 02/16/2015   PCP: [name], MD  Referring Physician: ED/ Dr. Audie Pinto  Chief Complaint: sob, abnormal lab  HPI:  68 year old female with a history of hypertension, paroxysmal atrial fibrillation on apixiban, GERD, chronic diastolic CHF, diabetes mellitus presented with 1 month history of shortness of breath. The patient went to see her pulmonologist today. Routine blood work was obtained and showed hemoglobin of 7. As a result, the patient was sent to the emergency room for further evaluation. As the history goes, the patient has been complaining of worsening dyspnea on exertion for the past month. She has had intermittent chest discomfort at rest as well as with exertion. The patient is normally on home oxygen, 3 L nasal cannula 24 hours a day. She has a nonproductive cough but denies any hemoptysis. The patient has had subjective fevers and chills. She denies any vomiting, diarrhea, dysuria, hematuria. She did have some blood on her toilet paper when she wipes. Otherwise, she has not seen any blood in the commode.  In the emergency room, the patient was afebrile hemodynamically stable without any tachycardia. Oxygen saturation was 100% on nasal cannula with 3 L.  Rectal exam revealed brown stool that was minimally positive. Chest x-ray showed some mild increase in interstitial markings. Assessment/Plan: Symptomatic anemia with decompensated diastolic CHF/acute on chronic diastolic CHF -The patient's dry weight appears to be around 235 pounds -The patient's weight at Dr. Juanetta Gosling office on 02/16/2015 was 245 pounds -Start the patient on furosemide 60 mg IV every 12 hours -Transfuse 2 units PRBC with 40 units milligrams intravenous furosemide in between units -The patient's gastroenterologist has been consulted and will see the patient in the morning 02/17/2015 -Daily weights -Patient is clinically volume  overloaded -Continue metoprolol -Cannot initiate ACE inhibitor secondary to renal insufficiency -03/06/2014 echo cardioversion shows EF 60-65% -Last colonoscopy 06/29/2011 shows 3 polyps with diverticulosis -08/15/2013 EGD normal -PTT/INR although these may be elevated secondary to patient's apixiban -Discontinue apixiban -SCDs for DVT prophylaxis -CT abdomen and pelvis as the patient does have some mild periumbilical pain on examination--?ischemic colitis Diabetes mellitus type 2 -Give half home dose Lantus -NovoLog sliding scale Atypical chest pain -Cycle troponins -EKG without any concerning ischemic changes Chronic respiratory failure -Presently stable on 3 L nasal cannula -Patient has history of asthma and previous tobacco use -Blocker dilators when necessary shortness of breath Paroxysmal Atrial fibrillation -Presently in sinus -hold Apixiban -Continue metoprolol and diltiazem GERD -Covert IV PPI for now -Patient has history of [name]'s esophagus CKD stage IV -Monitor closely with diuresis -Baseline creatinine 3.1-3.3      Past Medical History  Diagnosis Date  . Atrial flutter     ablated by Dr Lovena Le in 2008  . Hypertension   . Diabetes mellitus   . Diastolic heart failure     a. EF 60-65%, RV nl (03/2014)  . Asthma   . Hyperlipidemia   . Fatty liver   . Esophageal dysmotility   . Arthritis   . Sleep apnea     wears CPAP  . Fatty tumor fatty tumor back  . Coronary atherosclerosis of native coronary artery   . Morbid obesity   . Myocardial infarction 2009  . Heart murmur   . Peripheral vascular disease   . GERD (gastroesophageal reflux disease)     barrets esophagus  . Anginal pain     occ; non-ischemic Lexiscan 09/2012  . Kidney disease  CKD stage IV (Dr. Erling Cruz)  . Complication of anesthesia     " DIFFICULTY BREATHING "  . Persistent atrial fibrillation     chads2vasc score of at least 5  . Sick sinus syndrome   . [name] esophagus    . Hiatal hernia   . Esophageal dysmotilities    Past Surgical History  Procedure Laterality Date  . Coronary angioplasty with stent placement    . Breast lumpectomy      right  . Tubal ligation    . Tonsillectomy    . Total knee arthroplasty Right 12/15/2013    Procedure: RIGHT TOTAL KNEE ARTHROPLASTY;  Surgeon: Alta Corning, MD;  Location: [place];  Service: Orthopedics;  Laterality: Right;  . Cardioversion N/A 03/03/2014    Procedure: CARDIOVERSION;  Surgeon: Laverda Page, MD;  Location: [facility];  Service: Cardiovascular;  Laterality: N/A;  . Atrial flutter ablation  2008    CTI ablation by Dr Lovena Le  . Pacemaker insertion  06/18/14    STJ Assurity dual chamber pacemaker implanted by Dr Rayann Heman  . Right heart catheterization N/A 06/12/2014    Procedure: RIGHT HEART CATH;  Surgeon: Jolaine Artist, MD;  Location: [facility] CATH LAB;  Service: Cardiovascular;  Laterality: N/A;  . Permanent pacemaker insertion N/A 06/18/2014    SJM Assurity DR pacemaker implanted by Dr Rayann Heman for sick sinus syndrome   Social History:  reports that she quit smoking about 20 years ago. Her smoking use included Cigarettes. She has a 17.5 pack-year smoking history. She has never used smokeless tobacco. She reports that she does not drink alcohol or use illicit drugs.   Family History  Problem Relation Age of Onset  . Heart disease Mother   . Cancer Mother     bladder  . Kidney disease Mother   . Ovarian cancer Daughter   . Stomach cancer Maternal Uncle   . Colon cancer Maternal Aunt   . Esophageal cancer Neg Hx      Allergies  Allergen Reactions  . Adhesive [Tape] Itching    EKG leads  . Sulfa Antibiotics Itching and Nausea And Vomiting    "everything I seen was red"  . Codeine Nausea And Vomiting  . Penicillins Nausea And Vomiting      Prior to Admission medications   Medication Sig Start Date End Date Taking? Authorizing Provider  acetaminophen (TYLENOL) 500 MG tablet Take 500 mg  by mouth every 6 (six) hours as needed for mild pain.   Yes Historical Provider, MD  albuterol (PROVENTIL) (2.5 MG/3ML) 0.083% nebulizer solution Take 2.5 mg by nebulization every 6 (six) hours as needed for shortness of breath. Use four times a day as needed for shortness of breath or wheezing 08/24/14  Yes Historical Provider, MD  amiodarone (PACERONE) 200 MG tablet Take 1 tablet (200 mg total) by mouth daily. 08/19/14  Yes Jolaine Artist, MD  amitriptyline (ELAVIL) 25 MG tablet Take 25 mg by mouth at bedtime.     Yes Historical Provider, MD  apixaban (ELIQUIS) 5 MG TABS tablet Take 1 tablet (5 mg total) by mouth 2 (two) times daily. 10/28/14  Yes Jolaine Artist, MD  atorvastatin (LIPITOR) 40 MG tablet Take 1 tablet (40 mg total) by mouth daily. 04/29/14  Yes Jolaine Artist, MD  dicyclomine (BENTYL) 10 MG capsule Take 10 mg by mouth daily.    Yes Historical Provider, MD  diltiazem (CARDIZEM CD) 180 MG 24 hr capsule Take 1 capsule (180 mg  total) by mouth daily. 12/01/14  Yes Jolaine Artist, MD  esomeprazole (NEXIUM) 40 MG capsule Take 1 capsule (40 mg total) by mouth daily at 12 noon. 04/29/14  Yes Jolaine Artist, MD  febuxostat (ULORIC) 40 MG tablet Take 40 mg by mouth daily.    Yes Historical Provider, MD  hydrALAZINE (APRESOLINE) 25 MG tablet Take 1 tablet (25 mg total) by mouth 3 (three) times daily. 10/28/14  Yes Jolaine Artist, MD  HYDROcodone-acetaminophen (NORCO/VICODIN) 5-325 MG per tablet Take 1 tablet by mouth every 6 (six) hours as needed. Pain 08/18/14  Yes Historical Provider, MD  insulin glargine (LANTUS) 100 UNIT/ML injection Inject 0.2 mLs (20 Units total) into the skin at bedtime. Patient taking differently: Inject 20 Units into the skin daily as needed (high blood sugar).  06/20/14  Yes Hosie Poisson, MD  insulin lispro (HUMALOG) 100 UNIT/ML injection Inject 0.2 mLs (20 Units total) into the skin 3 (three) times daily after meals. Patient taking differently:  Inject 50 Units into the skin every morning.  12/23/13  Yes Delfina Redwood, MD  Linaclotide [facility]) 145 MCG CAPS capsule Take 145 mcg by mouth daily.    Yes Historical Provider, MD  metolazone (ZAROXOLYN) 2.5 MG tablet Take 1 tablet (2.5 mg total) by mouth once a week. MONDAYS 01/26/15  Yes Larey Dresser, MD  metoprolol tartrate (LOPRESSOR) 25 MG tablet TAKE THREE TABLETS BY MOUTH TWICE DAILY 01/26/15  Yes Jolaine Artist, MD  nitroGLYCERIN (NITROSTAT) 0.4 MG SL tablet Place 0.4 mg under the tongue every 5 (five) minutes as needed for chest pain.    Yes Historical Provider, MD  potassium chloride SA (K-DUR,KLOR-CON) 20 MEQ tablet Take 3 tabs in the AM and 2 tabs in the PM 01/18/15  Yes Larey Dresser, MD  ranitidine (ZANTAC) 150 MG tablet Take 1 tablet (150 mg total) by mouth at bedtime. 01/26/15  Yes Larey Dresser, MD  torsemide (DEMADEX) 20 MG tablet Take 2 tablets (40 mg total) by mouth 2 (two) times daily. 01/18/15  Yes Larey Dresser, MD  Vitamin D, Ergocalciferol, (DRISDOL) 50000 UNITS CAPS capsule Take 50,000 Units by mouth every Monday.   Yes Historical Provider, MD  XOPENEX HFA 45 MCG/ACT inhaler Inhale 2 puffs into the lungs every 4 (four) hours as needed for wheezing or shortness of breath. 03/11/14  Yes Marijean Heath, NP  zolpidem (AMBIEN) 10 MG tablet Take 10 mg by mouth at bedtime.    Yes Historical Provider, MD    Review of Systems:  Constitutional:  No weight loss, night sweats,  Head&Eyes: No headache.  No vision loss.  No eye pain or scotoma ENT:  No Difficulty swallowing,Tooth/dental problems,Sore throat,  No ear ache, post nasal drip,  Cardio-vascular:  No  dizziness, palpitations  GI:  No  abdominal pain, nausea, vomiting, diarrhea, loss of appetite, hematochezia, melena, heartburn, indigestion, Resp:  . No coughing up of blood .No wheezing.No chest wall deformity  Skin:  no rash or lesions.  GU:  no dysuria, change in color of urine, no urgency or  frequency. No flank pain.  Musculoskeletal:  No joint pain or swelling. No decreased range of motion. No back pain.  Psych:  No change in mood or affect. No depression or anxiety. Neurologic: No headache, no dysesthesia, no focal weakness, no vision loss. No syncope  Physical Exam: Filed Vitals:   02/16/15 1700 02/16/15 1715 02/16/15 1745 02/16/15 1800  BP: 148/65 142/64 142/69 149/64  Pulse: 59  59 59 58  Temp:      TempSrc:      Resp: 17 15 15 20   Weight:      SpO2: 100% 100% 100% 100%   General:  A&O x 3, NAD, nontoxic, pleasant/cooperative Head/Eye: No conjunctival hemorrhage, no icterus, [place]/AT, No nystagmus ENT:  No icterus,  No thrush, good dentition, no pharyngeal exudate Neck:  No masses, no lymphadenpathy, no bruits CV:  RRR, no rub, no gallop, no S3; +JVD Lung:  Fine bibasilar crackles. No wheezing. Good air movement Abdomen: soft/NT, +BS, nondistended, no peritoneal signs Ext: No cyanosis, No rashes, No petechiae, No lymphangitis, 1+LE edema Neuro: CNII-XII intact, strength 4/5 in bilateral upper and lower extremities, no dysmetria  Labs on Admission:  Basic Metabolic Panel:  Recent Labs Lab 02/16/15 1138 02/16/15 1607  NA 139 138  K 4.3 4.3  CL 107 107  CO2 25 18*  GLUCOSE 204* 250*  BUN 34* 35*  CREATININE 3.10* 3.41*  CALCIUM 9.5 9.2   Liver Function Tests:  Recent Labs Lab 02/16/15 1138  AST 14  ALT 12  ALKPHOS 67  BILITOT 0.4  PROT 7.5  ALBUMIN 4.2   No results for input(s): LIPASE, AMYLASE in the last 168 hours. No results for input(s): AMMONIA in the last 168 hours. CBC:  Recent Labs Lab 02/16/15 1138 02/16/15 1607  WBC 8.9 8.6  NEUTROABS 6.4  --   HGB 7.3* 7.2*  HCT 25.0* 26.2*  MCV 72.2* 76.4*  PLT 234.0 237   Cardiac Enzymes: No results for input(s): CKTOTAL, CKMB, CKMBINDEX, TROPONINI in the last 168 hours. BNP: Invalid input(s): POCBNP CBG: No results for input(s): GLUCAP in the last 168 hours.  Radiological Exams on  Admission: Dg Chest 2 View  02/16/2015   CLINICAL DATA:  Shortness of breath  EXAM: CHEST  2 VIEW  COMPARISON:  01/18/2015  FINDINGS: Dual-chamber pacer leads from the left are in unremarkable position. Stable normal heart size and mild aortic tortuosity. Chronic interstitial coarsening. There is no edema, consolidation, effusion, or pneumothorax.  IMPRESSION: Stable appearance of the chest, including chronic bronchitic markings. No evidence of acute disease.   Electronically Signed   By: Monte Fantasia M.D.   On: 02/16/2015 16:37    EKG: Independently reviewed. Atrial paced with nonspecific T-wave changes    Time spent:60 minutes Code Status:   FULL Family Communication:   No Family at bedside   Mashell Sieben, DO  [facility] Pager [phone]  If 7PM-7AM, please contact night-coverage [email or web address] Password [email or web address] 02/16/2015, 6:18 PM

## 2015-02-16 NOTE — Progress Notes (Deleted)
   Subjective:    Patient ID: [name], female    DOB: [birth year], 69 y.o.   MRN: [id number]  HPI    Review of Systems  Constitutional: Negative for fever and unexpected weight change.  HENT: Negative for congestion, dental problem, ear pain, nosebleeds, postnasal drip, rhinorrhea, sinus pressure, sneezing, sore throat and trouble swallowing.        Allergies/sinus  Eyes: Negative for redness and itching.  Respiratory: Positive for cough, chest tightness, shortness of breath and wheezing.   Cardiovascular: Positive for palpitations ( pace maker) and leg swelling.  Gastrointestinal: Positive for nausea. Negative for vomiting.  Genitourinary: Negative for dysuria.  Musculoskeletal: Negative for joint swelling.  Skin: Negative for rash.  Neurological: Positive for headaches ( 1 week).  Hematological: Does not bruise/bleed easily.  Psychiatric/Behavioral: Positive for sleep disturbance and agitation. Negative for dysphoric mood. The patient is not nervous/anxious.        Objective:   Physical Exam        Assessment & Plan:

## 2015-02-16 NOTE — Telephone Encounter (Signed)
CBC Latest Ref Rng 02/16/2015 10/05/2014 07/21/2014  WBC 4.0 - 10.5 K/uL 8.9 7.5 11.0(H)  Hemoglobin 12.0 - 15.0 g/dL 7.3(LL) 12.8 14.6  Hematocrit 36.0 - 46.0 % 25.0(L) 40.1 45.3  Platelets 150.0 - 400.0 K/uL 234.0 210 196     Results d/w pt over the phone.  She reports having blood in her stool >> she thought this was from hemorrhoids.  I explained how low Hb can contribute to dyspnea.  Advised that she needs to go to the hospital emergency room to have further assessment of possible GI bleeding.  Advised that this is serious problem and she should not delay further evaluation.  She understands this, and will go to hospital.

## 2015-02-16 NOTE — Progress Notes (Signed)
Chief Complaint  Patient presents with  . SLEEP CONSULT    Referred by Dr Aundra Dubin for OSA - Uses CPAP machine with O2 at 3L/min 24/7.     History of Present Illness: [name] is a 69 y.o. female former smoker for evaluation of dyspnea and sleep apnea.  She was previously seen by Dr. Lamonte Sakai for aspiration pneumonia.  She continues to have trouble with her breathing.  She doesn't feel like she can do any activities at all.  She will get a cough, but usually no sputum.  She sometimes gets wheezing and has been told she has asthma.  She denies hemoptysis.  She uses inhalers, but these don't always help.  She started smoking at age 55.  She smoked up to 1/2 ppd, and quit in 1996.  She worked with [employer].  She is from [place].  She denies any animal exposures.  There is no hx of tuberculosis.  She is followed by cardiology for HTN, A fib, and chronic diastolic CHF.  She has been on amiodarone for sometime (she thinks at least a year).  She has been using 3 liters oxygen 24/7.  She gets her oxygen supplies from [place].  She has a history of arthritis.  She was previously seen by Dr. Amil Amen with rheumatology.  She had PFT from earlier this month which showed mild restriction, positive bronchodilator response, but significant diffusion defect.  Chest xray from 01/18/15 showed interstitial changes.  She has been using CPAP.  She gets her supplies for this from [facility].  She has a full face mask.  She feels the mask is too small.  She will wake up with her mattress shifted.  She feels tired during the day.  She is not sure if she is snoring when she uses CPAP.  Tests: Echo 03/06/14 >> mild LVH, EF 60 to 65% PFT 02/04/15 >> FEV1 1.88 (100%), FEV1% 83, TLC 3.79 (75%), DLCO 28%, +BD  [name]  has a past medical history of Atrial flutter; Hypertension; Diabetes mellitus; Diastolic heart failure; Asthma; Hyperlipidemia; Fatty liver; Esophageal dysmotility; Arthritis; Sleep apnea; Fatty  tumor (fatty tumor back); Coronary atherosclerosis of native coronary artery; Morbid obesity; Myocardial infarction (2009); Heart murmur; Peripheral vascular disease; GERD (gastroesophageal reflux disease); Anginal pain; Kidney disease; Complication of anesthesia; Persistent atrial fibrillation; Sick sinus syndrome; [name] esophagus; Hiatal hernia; and Esophageal dysmotilities.  [name]  has past surgical history that includes Coronary angioplasty with stent; Breast lumpectomy; Tubal ligation; Tonsillectomy; Total knee arthroplasty (Right, 12/15/2013); Cardioversion (N/A, 03/03/2014); Atrial flutter ablation (2008); Pacemaker insertion (06/18/14); right heart catheterization (N/A, 06/12/2014); and permanent pacemaker insertion (N/A, 06/18/2014).  Prior to Admission medications   Medication Sig Start Date End Date Taking? Authorizing Provider  acetaminophen (TYLENOL) 500 MG tablet Take 500 mg by mouth every 6 (six) hours as needed for mild pain.   Yes Historical Provider, MD  albuterol (PROVENTIL) (2.5 MG/3ML) 0.083% nebulizer solution Use four times a day as needed for shortness of breath or wheezing 08/24/14  Yes Historical Provider, MD  amiodarone (PACERONE) 200 MG tablet Take 1 tablet (200 mg total) by mouth daily. 08/19/14  Yes Jolaine Artist, MD  amitriptyline (ELAVIL) 25 MG tablet Take 25 mg by mouth at bedtime.     Yes Historical Provider, MD  apixaban (ELIQUIS) 5 MG TABS tablet Take 1 tablet (5 mg total) by mouth 2 (two) times daily. 10/28/14  Yes Jolaine Artist, MD  atorvastatin (LIPITOR) 40 MG tablet Take  1 tablet (40 mg total) by mouth daily. 04/29/14  Yes Jolaine Artist, MD  dicyclomine (BENTYL) 10 MG capsule Take 10 mg by mouth daily.    Yes Historical Provider, MD  diltiazem (CARDIZEM CD) 180 MG 24 hr capsule Take 1 capsule (180 mg total) by mouth daily. 12/01/14  Yes Jolaine Artist, MD  esomeprazole (NEXIUM) 40 MG capsule Take 1 capsule (40 mg total) by mouth daily at  12 noon. 04/29/14  Yes Jolaine Artist, MD  febuxostat (ULORIC) 40 MG tablet Take 40 mg by mouth daily.    Yes Historical Provider, MD  hydrALAZINE (APRESOLINE) 25 MG tablet Take 1 tablet (25 mg total) by mouth 3 (three) times daily. 10/28/14  Yes Jolaine Artist, MD  HYDROcodone-acetaminophen (NORCO/VICODIN) 5-325 MG per tablet Take 1 tablet by mouth every 6 (six) hours as needed. Pain 08/18/14  Yes Historical Provider, MD  insulin glargine (LANTUS) 100 UNIT/ML injection Inject 0.2 mLs (20 Units total) into the skin at bedtime. Patient taking differently: Inject 20 Units into the skin daily as needed (high blood sugar).  06/20/14  Yes Hosie Poisson, MD  insulin lispro (HUMALOG) 100 UNIT/ML injection Inject 0.2 mLs (20 Units total) into the skin 3 (three) times daily after meals. Patient taking differently: Inject 50 Units into the skin every morning.  12/23/13  Yes Delfina Redwood, MD  Linaclotide [facility]) 145 MCG CAPS capsule Take 145 mcg by mouth daily.    Yes Historical Provider, MD  metolazone (ZAROXOLYN) 2.5 MG tablet Take 1 tablet (2.5 mg total) by mouth once a week. MONDAYS 01/26/15  Yes Larey Dresser, MD  metoprolol tartrate (LOPRESSOR) 25 MG tablet TAKE THREE TABLETS BY MOUTH TWICE DAILY 01/26/15  Yes Jolaine Artist, MD  nitroGLYCERIN (NITROSTAT) 0.4 MG SL tablet Place 0.4 mg under the tongue every 5 (five) minutes as needed for chest pain.    Yes Historical Provider, MD  potassium chloride SA (K-DUR,KLOR-CON) 20 MEQ tablet Take 3 tabs in the AM and 2 tabs in the PM 01/18/15  Yes Larey Dresser, MD  ranitidine (ZANTAC) 150 MG tablet Take 1 tablet (150 mg total) by mouth at bedtime. 01/26/15  Yes Larey Dresser, MD  torsemide (DEMADEX) 20 MG tablet Take 2 tablets (40 mg total) by mouth 2 (two) times daily. 01/18/15  Yes Larey Dresser, MD  Vitamin D, Ergocalciferol, (DRISDOL) 50000 UNITS CAPS capsule Take 50,000 Units by mouth every Monday.   Yes Historical Provider, MD  XOPENEX  HFA 45 MCG/ACT inhaler Inhale 2 puffs into the lungs every 4 (four) hours as needed for wheezing or shortness of breath. 03/11/14  Yes Marijean Heath, NP  zolpidem (AMBIEN) 10 MG tablet Take 10 mg by mouth at bedtime.    Yes Historical Provider, MD    Allergies  Allergen Reactions  . Adhesive [Tape] Itching    EKG leads  . Sulfa Antibiotics Itching and Nausea And Vomiting    "everything I seen was red"  . Codeine Nausea And Vomiting  . Penicillins Nausea And Vomiting    Her family history includes Cancer in her mother; Colon cancer in her maternal aunt; Heart disease in her mother; Kidney disease in her mother; Ovarian cancer in her daughter; Stomach cancer in her maternal uncle. There is no history of Esophageal cancer.  She  reports that she quit smoking about 20 years ago. Her smoking use included Cigarettes. She has a 17.5 pack-year smoking history. She has never used smokeless tobacco.  She reports that she does not drink alcohol or use illicit drugs.  Review of Systems  Constitutional: Negative for fever and unexpected weight change.  HENT: Negative for congestion, dental problem, ear pain, nosebleeds, postnasal drip, rhinorrhea, sinus pressure, sneezing, sore throat and trouble swallowing.        Allergies/sinus  Eyes: Negative for redness and itching.  Respiratory: Positive for cough, chest tightness, shortness of breath and wheezing.   Cardiovascular: Positive for palpitations ( pace maker) and leg swelling.  Gastrointestinal: Positive for nausea. Negative for vomiting.  Genitourinary: Negative for dysuria.  Musculoskeletal: Negative for joint swelling.  Skin: Negative for rash.  Neurological: Positive for headaches ( 1 week).  Hematological: Does not bruise/bleed easily.  Psychiatric/Behavioral: Positive for sleep disturbance and agitation. Negative for dysphoric mood. The patient is not nervous/anxious.    Physical Exam: Blood pressure 128/68, pulse 59, height 5' 4"   (1.626 m), weight 245 lb (111.131 kg), SpO2 100 %. Body mass index is 42.03 kg/(m^2).  General - No distress ENT - No sinus tenderness, no oral exudate, no LAN, no thyromegaly, TM clear, pupils equal/reactive Cardiac - s1s2 regular, no murmur, pulses symmetric Chest - No wheeze/rales/dullness, good air entry, normal respiratory excursion Back - No focal tenderness Abd - Soft, non-tender, no organomegaly, + bowel sounds Ext - No edema Neuro - Normal strength, cranial nerves intact Skin - No rashes Psych - Normal mood, and behavior   Lab Results  Component Value Date   WBC 7.5 10/05/2014   HGB 12.8 10/05/2014   HCT 40.1 10/05/2014   MCV 88.5 10/05/2014   PLT 210 10/05/2014    Lab Results  Component Value Date   CREATININE 3.93* 01/26/2015   BUN 51* 01/26/2015   NA 136 01/26/2015   K 3.6 01/26/2015   CL 101 01/26/2015   CO2 21* 01/26/2015    Lab Results  Component Value Date   ALT 18 01/18/2015   AST 21 01/18/2015   ALKPHOS 60 01/18/2015   BILITOT 0.5 01/18/2015    Lab Results  Component Value Date   TSH 4.584* 01/26/2015    BNP    Component Value Date/Time   PROBNP 2594.0* 06/09/2014 1105    Discussion: She has progressive dyspnea.  She has hx of diastolic heart failure, and arthritis (?type).  She has hx of smoking and carries a diagnosis of asthma.  She has hx of reflux, and aspiration.  She has hx of atrial fibrillation and has been on amiodarone.  Her chest xray shows chronic interstitial changes and she has significant diffusion defect on recent PFT.  She likely also has a component of deconditioning.  She has hx of sleep apnea.  She has been using CPAP and supplemental oxygen.  She has non restorative sleep.  Assessment/Plan:  Dyspnea. Plan: - check labs >> CBC diff, CMET, ESR, RF, ANA, SCL 70, anti Jo, SSa, SSb - check records from Dr. Melissa Noon office - arrange for high resolution CT chest  Hx of tobacco abuse with asthma. She did have positive  BD response on PFT. Plan: - continue albuterol for now - depending on remainder of evaluation will determine if she should try inhaled steroid medication  Hx of A fib on amiodarone. Concern is whether her interstitial changes on CXR and diffusion defect on PFT could be related to amiodarone use. Plan: - will check HRCT chest  Chronic diastolic heart failure. Plan: - f/u with cardiology  Hx of GERD and [name]'s esophagus with aspiration. She was previously  seen by Dr. Delfin Edis >> last in 2014. Plan: - might need additional GI follow up  Obstructive sleep apnea. Plan: - will get CPAP download and then determine if any changes to her set up are needed - she is to continue supplemental oxygen at 3 liters for now   Chesley Mires, MD [place] [facility]/Critical [facility]/Sleep Pager:  [phone]

## 2015-02-16 NOTE — ED Provider Notes (Signed)
CSN: [id number]     Arrival date & time 02/16/15  1539 History   First MD Initiated Contact with Patient 02/16/15 1625     Chief Complaint  Patient presents with  . Shortness of Breath     HPI  Expand All Collapse All   Pt in c/o increased shortness of breath that has been progressing since the middle of may, pt went to her pulmonologist today and was told to come here due to low hgb, pt lips noted to be pale, arrived on O2, uses 3L via [facility], alert and oriented, no distress noted.  Patient has had some blood in her stool but she thought it was her hemorrhoids.  She currently is on blood thinners.  Patient denies any hematemesis.          Past Medical History  Diagnosis Date  . Atrial flutter     ablated by Dr Lovena Le in 2008  . Hypertension   . Diabetes mellitus   . Diastolic heart failure     a. EF 60-65%, RV nl (03/2014)  . Asthma   . Hyperlipidemia   . Fatty liver   . Esophageal dysmotility   . Arthritis   . Sleep apnea     wears CPAP  . Fatty tumor fatty tumor back  . Coronary atherosclerosis of native coronary artery   . Morbid obesity   . Myocardial infarction 2009  . Heart murmur   . Peripheral vascular disease   . GERD (gastroesophageal reflux disease)     barrets esophagus  . Anginal pain     occ; non-ischemic Lexiscan 09/2012  . Kidney disease     CKD stage IV (Dr. Erling Cruz)  . Complication of anesthesia     " DIFFICULTY BREATHING "  . Persistent atrial fibrillation     chads2vasc score of at least 5  . Sick sinus syndrome   . [name] esophagus   . Hiatal hernia   . Esophageal dysmotilities    Past Surgical History  Procedure Laterality Date  . Coronary angioplasty with stent placement    . Breast lumpectomy      right  . Tubal ligation    . Tonsillectomy    . Total knee arthroplasty Right 12/15/2013    Procedure: RIGHT TOTAL KNEE ARTHROPLASTY;  Surgeon: Alta Corning, MD;  Location: [facility];  Service: Orthopedics;  Laterality: Right;  . Cardioversion  N/A 03/03/2014    Procedure: CARDIOVERSION;  Surgeon: Laverda Page, MD;  Location: [facility];  Service: Cardiovascular;  Laterality: N/A;  . Atrial flutter ablation  2008    CTI ablation by Dr Lovena Le  . Pacemaker insertion  06/18/14    STJ Assurity dual chamber pacemaker implanted by Dr Rayann Heman  . Right heart catheterization N/A 06/12/2014    Procedure: RIGHT HEART CATH;  Surgeon: Jolaine Artist, MD;  Location: [facility] CATH LAB;  Service: Cardiovascular;  Laterality: N/A;  . Permanent pacemaker insertion N/A 06/18/2014    SJM Assurity DR pacemaker implanted by Dr Rayann Heman for sick sinus syndrome   Family History  Problem Relation Age of Onset  . Heart disease Mother   . Cancer Mother     bladder  . Kidney disease Mother   . Ovarian cancer Daughter   . Stomach cancer Maternal Uncle   . Colon cancer Maternal Aunt   . Esophageal cancer Neg Hx    History  Substance Use Topics  . Smoking status: Former Smoker -- 0.50 packs/day for 35 years  Types: Cigarettes    Quit date: 02/16/1995  . Smokeless tobacco: Never Used     Comment: 05/2014  QUIT OVER 20 YEARS AGO "  . Alcohol Use: No   OB History    No data available     Review of Systems All other systems reviewed and are negative   Allergies  Adhesive; Sulfa antibiotics; Codeine; and Penicillins  Home Medications   Prior to Admission medications   Medication Sig Start Date End Date Taking? Authorizing Provider  acetaminophen (TYLENOL) 500 MG tablet Take 500 mg by mouth every 6 (six) hours as needed for mild pain.   Yes Historical Provider, MD  albuterol (PROVENTIL) (2.5 MG/3ML) 0.083% nebulizer solution Take 2.5 mg by nebulization every 6 (six) hours as needed for shortness of breath. Use four times a day as needed for shortness of breath or wheezing 08/24/14  Yes Historical Provider, MD  amiodarone (PACERONE) 200 MG tablet Take 1 tablet (200 mg total) by mouth daily. 08/19/14  Yes Jolaine Artist, MD  amitriptyline  (ELAVIL) 25 MG tablet Take 25 mg by mouth at bedtime.     Yes Historical Provider, MD  apixaban (ELIQUIS) 5 MG TABS tablet Take 1 tablet (5 mg total) by mouth 2 (two) times daily. 10/28/14  Yes Jolaine Artist, MD  atorvastatin (LIPITOR) 40 MG tablet Take 1 tablet (40 mg total) by mouth daily. 04/29/14  Yes Jolaine Artist, MD  dicyclomine (BENTYL) 10 MG capsule Take 10 mg by mouth daily.    Yes Historical Provider, MD  diltiazem (CARDIZEM CD) 180 MG 24 hr capsule Take 1 capsule (180 mg total) by mouth daily. 12/01/14  Yes Jolaine Artist, MD  esomeprazole (NEXIUM) 40 MG capsule Take 1 capsule (40 mg total) by mouth daily at 12 noon. 04/29/14  Yes Jolaine Artist, MD  febuxostat (ULORIC) 40 MG tablet Take 40 mg by mouth daily.    Yes Historical Provider, MD  hydrALAZINE (APRESOLINE) 25 MG tablet Take 1 tablet (25 mg total) by mouth 3 (three) times daily. 10/28/14  Yes Jolaine Artist, MD  HYDROcodone-acetaminophen (NORCO/VICODIN) 5-325 MG per tablet Take 1 tablet by mouth every 6 (six) hours as needed. Pain 08/18/14  Yes Historical Provider, MD  insulin glargine (LANTUS) 100 UNIT/ML injection Inject 0.2 mLs (20 Units total) into the skin at bedtime. Patient taking differently: Inject 20 Units into the skin daily as needed (high blood sugar).  06/20/14  Yes Hosie Poisson, MD  insulin lispro (HUMALOG) 100 UNIT/ML injection Inject 0.2 mLs (20 Units total) into the skin 3 (three) times daily after meals. Patient taking differently: Inject 50 Units into the skin every morning.  12/23/13  Yes Delfina Redwood, MD  Linaclotide [facility]) 145 MCG CAPS capsule Take 145 mcg by mouth daily.    Yes Historical Provider, MD  metolazone (ZAROXOLYN) 2.5 MG tablet Take 1 tablet (2.5 mg total) by mouth once a week. MONDAYS 01/26/15  Yes Larey Dresser, MD  metoprolol tartrate (LOPRESSOR) 25 MG tablet TAKE THREE TABLETS BY MOUTH TWICE DAILY 01/26/15  Yes Jolaine Artist, MD  nitroGLYCERIN (NITROSTAT) 0.4 MG  SL tablet Place 0.4 mg under the tongue every 5 (five) minutes as needed for chest pain.    Yes Historical Provider, MD  potassium chloride SA (K-DUR,KLOR-CON) 20 MEQ tablet Take 3 tabs in the AM and 2 tabs in the PM 01/18/15  Yes Larey Dresser, MD  ranitidine (ZANTAC) 150 MG tablet Take 1 tablet (150 mg total)  by mouth at bedtime. 01/26/15  Yes Larey Dresser, MD  torsemide (DEMADEX) 20 MG tablet Take 2 tablets (40 mg total) by mouth 2 (two) times daily. 01/18/15  Yes Larey Dresser, MD  Vitamin D, Ergocalciferol, (DRISDOL) 50000 UNITS CAPS capsule Take 50,000 Units by mouth every Monday.   Yes Historical Provider, MD  XOPENEX HFA 45 MCG/ACT inhaler Inhale 2 puffs into the lungs every 4 (four) hours as needed for wheezing or shortness of breath. 03/11/14  Yes Marijean Heath, NP  zolpidem (AMBIEN) 10 MG tablet Take 10 mg by mouth at bedtime.    Yes Historical Provider, MD   BP 142/69 mmHg  Pulse 59  Temp(Src) 98.3 F (36.8 C) (Oral)  Resp 15  Wt 245 lb (111.131 kg)  SpO2 100% Physical Exam  Constitutional: She is oriented to person, place, and time. She appears well-developed and well-nourished. No distress.  HENT:  Head: Normocephalic and atraumatic.  Mouth/Throat: Mucous membranes are pale.  Eyes: Pupils are equal, round, and reactive to light.  Neck: Normal range of motion.  Cardiovascular: Normal rate and intact distal pulses.   Pulmonary/Chest: No respiratory distress.  Abdominal: Normal appearance. She exhibits no distension.  Musculoskeletal: Normal range of motion.  Neurological: She is alert and oriented to person, place, and time. No cranial nerve deficit.  Skin: Skin is warm and dry. No rash noted. There is pallor.  Psychiatric: She has a normal mood and affect. Her behavior is normal.  Nursing note and vitals reviewed.   ED Course  Procedures (including critical care time) CRITICAL CARE Performed by: Leonard Schwartz L Total critical care time: 30 min Critical care  time was exclusive of separately billable procedures and treating other patients. Critical care was necessary to treat or prevent imminent or life-threatening deterioration. Critical care was time spent personally by me on the following activities: development of treatment plan with patient and/or surrogate as well as nursing, discussions with consultants, evaluation of patient's response to treatment, examination of patient, obtaining history from patient or surrogate, ordering and performing treatments and interventions, ordering and review of laboratory studies, ordering and review of radiographic studies, pulse oximetry and re-evaluation of patient's condition.  Labs Review Labs Reviewed  CBC - Abnormal; Notable for the following:    RBC 3.43 (*)    Hemoglobin 7.2 (*)    HCT 26.2 (*)    MCV 76.4 (*)    MCH 21.0 (*)    MCHC 27.5 (*)    RDW 18.4 (*)    All other components within normal limits  BASIC METABOLIC PANEL - Abnormal; Notable for the following:    CO2 18 (*)    Glucose, Bld 250 (*)    BUN 35 (*)    Creatinine, Ser 3.41 (*)    GFR calc non Af Amer 13 (*)    GFR calc Af Amer 15 (*)    All other components within normal limits  BRAIN NATRIURETIC PEPTIDE - Abnormal; Notable for the following:    B Natriuretic Peptide 364.2 (*)    All other components within normal limits  POC OCCULT BLOOD, ED - Abnormal; Notable for the following:    Fecal Occult Bld POSITIVE (*)    All other components within normal limits  OCCULT BLOOD X 1 CARD TO LAB, STOOL  I-STAT TROPOININ, ED  TYPE AND SCREEN    Imaging Review Dg Chest 2 View  02/16/2015   CLINICAL DATA:  Shortness of breath  EXAM: CHEST  2 VIEW  COMPARISON:  01/18/2015  FINDINGS: Dual-chamber pacer leads from the left are in unremarkable position. Stable normal heart size and mild aortic tortuosity. Chronic interstitial coarsening. There is no edema, consolidation, effusion, or pneumothorax.  IMPRESSION: Stable appearance of the  chest, including chronic bronchitic markings. No evidence of acute disease.   Electronically Signed   By: Monte Fantasia M.D.   On: 02/16/2015 16:37     EKG Interpretation   Date/Time:  Tuesday February 16 2015 15:56:52 EDT Ventricular Rate:  60 PR Interval:  200 QRS Duration: 96 QT Interval:  456 QTC Calculation: 456 R Axis:   33 Text Interpretation:  Atrial-paced rhythm ST \\T \ T wave abnormality,  consider lateral ischemia Abnormal ECG Confirmed by Tajha Sammarco  MD, Jaquisha Frech  ([id number]) on 02/16/2015 4:27:02 PM      MDM   Final diagnoses:  Anemia, unspecified anemia type  Congestive heart failure, unspecified congestive heart failure chronicity, unspecified congestive heart failure type        Leonard Schwartz, MD 02/20/15 [phone]

## 2015-02-16 NOTE — ED Notes (Signed)
Pt in c/o increased shortness of breath that has been progressing since the middle of may, pt went to her pulmonologist today and was told to come here due to low hgb, pt lips noted to be pale, arrived on O2, uses 3L via [place], alert and oriented, no distress noted

## 2015-02-17 ENCOUNTER — Inpatient Hospital Stay (HOSPITAL_COMMUNITY): Payer: Commercial Managed Care - HMO

## 2015-02-17 ENCOUNTER — Encounter (HOSPITAL_COMMUNITY): Payer: Self-pay | Admitting: Physician Assistant

## 2015-02-17 DIAGNOSIS — N184 Chronic kidney disease, stage 4 (severe): Secondary | ICD-10-CM

## 2015-02-17 DIAGNOSIS — I5033 Acute on chronic diastolic (congestive) heart failure: Secondary | ICD-10-CM

## 2015-02-17 DIAGNOSIS — D649 Anemia, unspecified: Secondary | ICD-10-CM

## 2015-02-17 DIAGNOSIS — I495 Sick sinus syndrome: Secondary | ICD-10-CM

## 2015-02-17 DIAGNOSIS — I480 Paroxysmal atrial fibrillation: Secondary | ICD-10-CM

## 2015-02-17 DIAGNOSIS — R079 Chest pain, unspecified: Secondary | ICD-10-CM

## 2015-02-17 LAB — CBC
HCT: 31.2 % — ABNORMAL LOW (ref 36.0–46.0)
Hemoglobin: 8.9 g/dL — ABNORMAL LOW (ref 12.0–15.0)
MCH: 22.1 pg — ABNORMAL LOW (ref 26.0–34.0)
MCHC: 28.5 g/dL — ABNORMAL LOW (ref 30.0–36.0)
MCV: 77.4 fL — ABNORMAL LOW (ref 78.0–100.0)
Platelets: 191 10*3/uL (ref 150–400)
RBC: 4.03 MIL/uL (ref 3.87–5.11)
RDW: 17.8 % — ABNORMAL HIGH (ref 11.5–15.5)
WBC: 6.9 10*3/uL (ref 4.0–10.5)

## 2015-02-17 LAB — BASIC METABOLIC PANEL
Anion gap: 12 (ref 5–15)
BUN: 31 mg/dL — ABNORMAL HIGH (ref 6–20)
CO2: 22 mmol/L (ref 22–32)
Calcium: 9 mg/dL (ref 8.9–10.3)
Chloride: 107 mmol/L (ref 101–111)
Creatinine, Ser: 2.99 mg/dL — ABNORMAL HIGH (ref 0.44–1.00)
GFR calc Af Amer: 17 mL/min — ABNORMAL LOW (ref >60)
GFR calc non Af Amer: 15 mL/min — ABNORMAL LOW (ref >60)
Glucose, Bld: 80 mg/dL (ref 65–99)
Potassium: 3.8 mmol/L (ref 3.5–5.1)
Sodium: 141 mmol/L (ref 135–145)

## 2015-02-17 LAB — GLUCOSE, CAPILLARY
Glucose-Capillary: 97 mg/dL (ref 65–99)
Glucose-Capillary: 101 mg/dL — ABNORMAL HIGH (ref 65–99)
Glucose-Capillary: 258 mg/dL — ABNORMAL HIGH (ref 65–99)
Glucose-Capillary: 110 mg/dL — ABNORMAL HIGH (ref 65–99)

## 2015-02-17 LAB — JO-1 ANTIBODY-IGG: Jo-1 Antibody, IgG: <1

## 2015-02-17 LAB — TYPE AND SCREEN
ABO/RH(D): A POS
Antibody Screen: NEGATIVE
Unit division: 0
Unit division: 0

## 2015-02-17 LAB — ANTI-SCLERODERMA ANTIBODY: Scleroderma (Scl-70) (ENA) Antibody, IgG: <1

## 2015-02-17 LAB — SJOGREN'S SYNDROME ANTIBODS(SSA + SSB)
SSA (Ro) (ENA) Antibody, IgG: <1
SSB (La) (ENA) Antibody, IgG: <1

## 2015-02-17 LAB — ANA W/REFLEX IF POSITIVE: Anti Nuclear Antibody(ANA): NEGATIVE

## 2015-02-17 LAB — CYCLIC CITRUL PEPTIDE ANTIBODY, IGG: Cyclic Citrullin Peptide Ab: <2 U/mL (ref 0.0–5.0)

## 2015-02-17 MED ORDER — PERFLUTREN LIPID MICROSPHERE
1.0000 mL | INTRAVENOUS | Status: AC | PRN
  Administered 2015-02-17: 3 mL via INTRAVENOUS
  Filled 2015-02-17: qty 10

## 2015-02-17 MED ORDER — PANTOPRAZOLE SODIUM 40 MG PO TBEC
40.0000 mg | DELAYED_RELEASE_TABLET | Freq: Every day | ORAL | Status: DC
  Administered 2015-02-18 – 2015-02-19 (×2): 40 mg via ORAL
  Filled 2015-02-17 (×2): qty 1

## 2015-02-17 MED ORDER — PEG-KCL-NACL-NASULF-NA ASC-C 100 G PO SOLR
0.5000 | Freq: Once | ORAL | Status: AC
  Administered 2015-02-18: 100 g via ORAL

## 2015-02-17 MED ORDER — PEG-KCL-NACL-NASULF-NA ASC-C 100 G PO SOLR
0.5000 | Freq: Once | ORAL | Status: AC
  Administered 2015-02-17: 100 g via ORAL
  Filled 2015-02-17: qty 1

## 2015-02-17 MED ORDER — PEG-KCL-NACL-NASULF-NA ASC-C 100 G PO SOLR: 1.0000 | Freq: Once | ORAL | Status: DC

## 2015-02-17 NOTE — Consult Note (Signed)
   [facility] CM Inpatient Consult   02/17/2015  [name] [birth year] [phone] Referral received in progression meeting per MD for HF exacerbation and community follow up. Patient evaluated for community based chronic disease management services with [facility] as a benefit of patient's [employer] Medicare Insurance. Patient with a previous history with [facility] Care Management.Met with patient at bedside to explain [place] Management services.  Patient states she was having worsening symptoms at home but didn't know she could call [facility] back if needed. Patient states she was having difficulty moving and breathing.  She states she has had a history also with [facility] for home health care.  Patient will receive post discharge transition of care call and will be evaluated for monthly home visits for assessments and disease process education.  Consent form signed.  Left contact information and [facility] literature at bedside. Made Inpatient Case Manager aware that [facility] Management following. Of note, [facility] Care Management services does not replace or interfere with any services that are arranged by inpatient case management or social work.  For additional questions or referrals please contact:   Natividad Brood, RN BSN [facility] Liaison  [phone] business mobile phone

## 2015-02-17 NOTE — Progress Notes (Signed)
UR COMPLETED  

## 2015-02-17 NOTE — Progress Notes (Signed)
Patient refused CPAP.  Patient aware to ask RN to call Respiratory if she changes her mind.

## 2015-02-17 NOTE — Consult Note (Signed)
[facility] Consult: 9:02 AM 02/17/2015  LOS: 1 day    Referring Provider: Dr Tana Coast Primary Care Physician:  [name], MD Primary Gastroenterologist:  Dr. Olevia Perches    Reason for Consultation:  Microcytic anemia.    HPI: [name] is a 69 y.o. female.  PMH stage 4 CKD.  HTN.   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 On Eliquis.  IDDM. Obesity/OSA.  Chronic 3 liters home O2.  Hx diastolic heart failure; "Indeterminate"  diastolic function and LVEF 60 to 65% on 2D echo in 03/2014.  Aspiration PNA 02/2014.  Right TKR 12/2013.   GI hx HP polyps and barret's esophagus, esophageal dysmotility.   04/2014 Esophagram: severe, non-specific dysmotility with tertiary contractions/spasm. Marked esophageal stasis, requiring upright positioning for gravity drainage in the esophagus.  Small hiatal hernia.  08/2013 EGD:  For [name]'s surveillance.  [name]'s with intestinal metaplasia, goblet cells on 2012 EGD.  Dr Judge Stall grossly normal.  Pathology:  - [name]'S MUCOSA (COMPLETE INTESTINAL METAPLASIA). - NEGATIVE FOR DYSPLASIA OR MALIGNANCY. 06/2011 Colonoscopy.  For abdominal pain and hx rectosigmoid polyps in 2007.  Dr Olevia Perches Scattered tics, small polyps at 10 CM.  Otherwise normal study.  Pathology:  HYPERPLASTIC POLYPS (X3) 10/2006 Colonoscopy: screening study.  Dr Watt Climes Small polyps (path HP) found in rectum and sigmoid.  Int/ext rrhoids. 2008 EGD.  Dr Watt Climes.  [facility], o/w normal.  12/2006 Gastric Emptying study: normal.   Seen by pulmonary MD 6/14 c/o increasing dyspnea > 1 month.  Difficult to climb even a few stairs.  + cough with tenacious sputum.  3 to 4 pillow orthopnea.  Upped her Nolic oxygen to 6 liters, constantly has fan on to relieve air hunger.  Found to be anemic and sent to [facility] for  admit. + resting and exertional chest discomfort Hgb 7.2, MCV 72.  PT16.9, INR 1.3.  hgb in 10/05/14 was 12.8. MCV 88.  FOBT + and has chronic, intermittent minor BPR with wiping, dating back many years. Has BM every other day, the days she takes her 2 Linzess.  Stools initially large/formed "like delivering a baby" but remainder are loose and "tarry": that is they are soft, and she sees oily smudge floating at top of commode water.  Stools are brown, not black. + reflux/regurgitation.  + dysphagia to rice and to potassium pills.  Globus sensation and sore throat at top of her throat.  No NSAIDs.    Previously seen by Dr Florene Glen for her CKD but lost to follow up due to multiple admits and health care issues in the last year.     Past Medical History  Diagnosis Date  . Atrial flutter     ablated by Dr Lovena Le in 2008  . Hypertension   . IDDM (insulin dependent diabetes mellitus)     type 2.   . Diastolic heart failure     a. EF 60-65%, RV nl (03/2014)  . Asthma   . Hyperlipidemia   . Fatty liver 2008    noted on ultrasound 2008  . Esophageal dysmotility 2008  noted on esophagram.  hx dysphagia.   . Arthritis   . Sleep apnea     wears CPAP  . Fatty tumor fatty tumor back  . Coronary atherosclerosis of native coronary artery   . Morbid obesity   . Myocardial infarction 2009  . Heart murmur   . Peripheral vascular disease   . GERD (gastroesophageal reflux disease) 2012    Barrets esophagus on bx 2012 and 2014.   . Anginal pain     occ; non-ischemic Lexiscan 09/2012  . Kidney disease     CKD stage IV (Dr. Erling Cruz)  . Complication of anesthesia     " DIFFICULTY BREATHING "  . Persistent atrial fibrillation     chads2 vasc score of at least 5  . Sick sinus syndrome   . CKD (chronic kidney disease)     stage 4 in 01/2015.     Past Surgical History  Procedure Laterality Date  . Coronary angioplasty with stent placement    . Breast lumpectomy      right  . Tubal ligation      . Tonsillectomy    . Total knee arthroplasty Right 12/15/2013    Procedure: RIGHT TOTAL KNEE ARTHROPLASTY;  Surgeon: Alta Corning, MD;  Location: [place];  Service: Orthopedics;  Laterality: Right;  . Cardioversion N/A 03/03/2014    Procedure: CARDIOVERSION;  Surgeon: Laverda Page, MD;  Location: [place];  Service: Cardiovascular;  Laterality: N/A;  . Atrial flutter ablation  2008    CTI ablation by Dr Lovena Le  . Pacemaker insertion  06/18/14    STJ Assurity dual chamber pacemaker implanted by Dr Rayann Heman  . Right heart catheterization N/A 06/12/2014    Procedure: RIGHT HEART CATH;  Surgeon: Jolaine Artist, MD;  Location: [facility] CATH LAB;  Service: Cardiovascular;  Laterality: N/A;  . Permanent pacemaker insertion N/A 06/18/2014    SJM Assurity DR pacemaker implanted by Dr Rayann Heman for sick sinus syndrome    Prior to Admission medications   Medication Sig Start Date End Date Taking? Authorizing Provider  acetaminophen (TYLENOL) 500 MG tablet Take 500 mg by mouth every 6 (six) hours as needed for mild pain.   Yes Historical Provider, MD  albuterol (PROVENTIL) (2.5 MG/3ML) 0.083% nebulizer solution Take 2.5 mg by nebulization every 6 (six) hours as needed for shortness of breath. Use four times a day as needed for shortness of breath or wheezing 08/24/14  Yes Historical Provider, MD  amiodarone (PACERONE) 200 MG tablet Take 1 tablet (200 mg total) by mouth daily. 08/19/14  Yes Jolaine Artist, MD  amitriptyline (ELAVIL) 25 MG tablet Take 25 mg by mouth at bedtime.     Yes Historical Provider, MD  apixaban (ELIQUIS) 5 MG TABS tablet Take 1 tablet (5 mg total) by mouth 2 (two) times daily. 10/28/14  Yes Jolaine Artist, MD  atorvastatin (LIPITOR) 40 MG tablet Take 1 tablet (40 mg total) by mouth daily. 04/29/14  Yes Jolaine Artist, MD  dicyclomine (BENTYL) 10 MG capsule Take 10 mg by mouth daily.    Yes Historical Provider, MD  diltiazem (CARDIZEM CD) 180 MG 24 hr capsule Take 1  capsule (180 mg total) by mouth daily. 12/01/14  Yes Jolaine Artist, MD  esomeprazole (NEXIUM) 40 MG capsule Take 1 capsule (40 mg total) by mouth daily at 12 noon. 04/29/14  Yes Jolaine Artist, MD  febuxostat (ULORIC) 40 MG tablet Take 40 mg by mouth daily.    Yes  Historical Provider, MD  hydrALAZINE (APRESOLINE) 25 MG tablet Take 1 tablet (25 mg total) by mouth 3 (three) times daily. 10/28/14  Yes Jolaine Artist, MD  HYDROcodone-acetaminophen (NORCO/VICODIN) 5-325 MG per tablet Take 1 tablet by mouth every 6 (six) hours as needed. Pain 08/18/14  Yes Historical Provider, MD  insulin glargine (LANTUS) 100 UNIT/ML injection Inject 0.2 mLs (20 Units total) into the skin at bedtime. Patient taking differently: Inject 20 Units into the skin daily as needed (high blood sugar).  06/20/14  Yes Hosie Poisson, MD  insulin lispro (HUMALOG) 100 UNIT/ML injection Inject 0.2 mLs (20 Units total) into the skin 3 (three) times daily after meals. Patient taking differently: Inject 50 Units into the skin every morning.  12/23/13  Yes Delfina Redwood, MD  Linaclotide [facility]) 145 MCG CAPS capsule Take 145 mcg by mouth daily.    Yes Historical Provider, MD  metolazone (ZAROXOLYN) 2.5 MG tablet Take 1 tablet (2.5 mg total) by mouth once a week. MONDAYS 01/26/15  Yes Larey Dresser, MD  metoprolol tartrate (LOPRESSOR) 25 MG tablet TAKE THREE TABLETS BY MOUTH TWICE DAILY 01/26/15  Yes Jolaine Artist, MD  nitroGLYCERIN (NITROSTAT) 0.4 MG SL tablet Place 0.4 mg under the tongue every 5 (five) minutes as needed for chest pain.    Yes Historical Provider, MD  potassium chloride SA (K-DUR,KLOR-CON) 20 MEQ tablet Take 3 tabs in the AM and 2 tabs in the PM 01/18/15  Yes Larey Dresser, MD  ranitidine (ZANTAC) 150 MG tablet Take 1 tablet (150 mg total) by mouth at bedtime. 01/26/15  Yes Larey Dresser, MD  torsemide (DEMADEX) 20 MG tablet Take 2 tablets (40 mg total) by mouth 2 (two) times daily. 01/18/15  Yes Larey Dresser, MD  Vitamin D, Ergocalciferol, (DRISDOL) 50000 UNITS CAPS capsule Take 50,000 Units by mouth every Monday.   Yes Historical Provider, MD  XOPENEX HFA 45 MCG/ACT inhaler Inhale 2 puffs into the lungs every 4 (four) hours as needed for wheezing or shortness of breath. 03/11/14  Yes Marijean Heath, NP  zolpidem (AMBIEN) 10 MG tablet Take 10 mg by mouth at bedtime.    Yes Historical Provider, MD    Scheduled Meds: . amiodarone  200 mg Oral Daily  . amitriptyline  25 mg Oral QHS  . antiseptic oral rinse  7 mL Mouth Rinse BID  . atorvastatin  40 mg Oral Daily  . diltiazem  180 mg Oral Daily  . famotidine  20 mg Oral QHS  . febuxostat  40 mg Oral Daily  . furosemide  60 mg Intravenous BID  . hydrALAZINE  25 mg Oral TID  . insulin aspart  0-15 Units Subcutaneous TID WC  . insulin aspart  0-5 Units Subcutaneous QHS  . insulin glargine  10 Units Subcutaneous QHS  . [START ON 02/22/2015] metolazone  2.5 mg Oral Q Mon  . metoprolol tartrate  75 mg Oral BID  . pantoprazole (PROTONIX) IV  40 mg Intravenous Q24H  . potassium chloride SA  20 mEq Oral BID  . sodium chloride  3 mL Intravenous Q12H  . zolpidem  10 mg Oral QHS   Infusions:   PRN Meds: sodium chloride, acetaminophen, albuterol, HYDROcodone-acetaminophen, ondansetron (ZOFRAN) IV, sodium chloride   Allergies as of 02/16/2015 - Review Complete 02/16/2015  Allergen Reaction Noted  . Adhesive [tape] Itching 06/16/2014  . Sulfa antibiotics Itching and Nausea And Vomiting 05/17/2011  . Codeine Nausea And Vomiting 05/17/2011  . Penicillins  Nausea And Vomiting 05/17/2011    Family History  Problem Relation Age of Onset  . Heart disease Mother   . Cancer Mother     bladder  . Kidney disease Mother   . Ovarian cancer Daughter   . Stomach cancer Maternal Uncle   . Colon cancer Maternal Aunt   . Esophageal cancer Neg Hx     History   Social History  . Marital Status: Widowed    Spouse Name: N/A  . Number of  Children: 3  . Years of Education: N/A   Occupational History  . Disabled    Social History Main Topics  . Smoking status: Former Smoker -- 0.50 packs/day for 35 years    Types: Cigarettes    Quit date: 02/16/1995  . Smokeless tobacco: Never Used     Comment: 05/2014  QUIT OVER 20 YEARS AGO "  . Alcohol Use: No  . Drug Use: No  . Sexual Activity: Not Currently   Other Topics Concern  . Not on file   Social History Narrative    REVIEW OF SYSTEMS: Constitutional:  Weight fluctuates 5 to 7 #.  + weakness, no falls ENT:  No nose bleeds Pulm:  Per HPI CV:  + chest pressure.  No palpitations, no LE edema.  GU:  No hematuria, no frequency GI:  Per HPI Heme:  No issues with low blood counts in past.   No unusual bleeding or bruising   Transfusions:  None before the last 24 hours Neuro:  No headaches, + neuropathy with tingling painful sensations in feet. Dilated eye exam up to date.  No hx of retinal laser treatment but does have cataracts Derm:  No itching, no rash or sores.  Endocrine:  No sweats or chills.  No polyuria or dysuria.  Sugars normally in 100s but can reach up to 300 and as low as 50s.  Immunization:  Not queried.  Travel:  None beyond local counties in last few months.    PHYSICAL EXAM: Vital signs in last 24 hours: Filed Vitals:   02/17/15 0526  BP: 120/54  Pulse: 59  Temp: 98.4 F (36.9 C)  Resp: 19   Wt Readings from Last 3 Encounters:  02/17/15 242 lb 11.2 oz (110.088 kg)  02/16/15 245 lb (111.131 kg)  01/26/15 235 lb (106.595 kg)   General: pleasant, obese, not ill appearing.  comfortable Head:  No asymmetry or swelling.    Eyes:  No icterus or conj pallor Ears:  Not HOH  Nose:  No congestion or discharge. Mouth:  Clear bil.   Neck:  Suppleo mass, no TMG, no JVD Lungs:  Clear bil but diminished overall.  No dyspnea with speech.  No cough Heart: RRR.  No MRG.  S1/S2 audible Abdomen:  Soft, obese, NT.  No mass or HSM.   Rectal: no masses or  visible or palpable hemorrhoids.  No rectoanal narrowing.  No sores/fissures or tenderness. Small amount of hard stool in vault is brown, no visible blood but FOBT test is trace positive.    Musc/Skeltl: no joint deformity or erythema Extremities:  No pedal edema.  Neurologic:  Oriented x 3.  No limb weakness or tremor.   Skin:  No telangectasia, sores or rash Tattoos:  none Nodes:  No cervical adenopathy.    Psych:  Pleasant, cooperative.  Good historian.   Intake/Output from previous day: 06/14 0701 - 06/15 0700 In: 1230 [P.O.:560; Blood:670] Out: 2200 [Urine:2200] Intake/Output this shift: Total I/O In: -  Out: 300 [Urine:300]  LAB RESULTS:  Recent Labs  02/16/15 1138 02/16/15 1607 02/17/15 0240  WBC 8.9 8.6 6.9  HGB 7.3* 7.2* 8.9*  HCT 25.0* 26.2* 31.2*  PLT 234.0 237 191   BMET Lab Results  Component Value Date   NA 141 02/17/2015   NA 138 02/16/2015   NA 139 02/16/2015   K 3.8 02/17/2015   K 4.3 02/16/2015   K 4.3 02/16/2015   CL 107 02/17/2015   CL 107 02/16/2015   CL 107 02/16/2015   CO2 22 02/17/2015   CO2 18* 02/16/2015   CO2 25 02/16/2015   GLUCOSE 80 02/17/2015   GLUCOSE 250* 02/16/2015   GLUCOSE 204* 02/16/2015   BUN 31* 02/17/2015   BUN 35* 02/16/2015   BUN 34* 02/16/2015   CREATININE 2.99* 02/17/2015   CREATININE 3.41* 02/16/2015   CREATININE 3.10* 02/16/2015   CALCIUM 9.0 02/17/2015   CALCIUM 9.2 02/16/2015   CALCIUM 9.5 02/16/2015   LFT  Recent Labs  02/16/15 1138  PROT 7.5  ALBUMIN 4.2  AST 14  ALT 12  ALKPHOS 67  BILITOT 0.4   PT/INR Lab Results  Component Value Date   INR 1.36 02/16/2015   INR 1.28 06/09/2014   INR 2.06* 03/07/2014    RADIOLOGY STUDIES: Dg Chest 2 View  02/16/2015   CLINICAL DATA:  Shortness of breath  EXAM: CHEST  2 VIEW  COMPARISON:  01/18/2015  FINDINGS: Dual-chamber pacer leads from the left are in unremarkable position. Stable normal heart size and mild aortic tortuosity. Chronic interstitial  coarsening. There is no edema, consolidation, effusion, or pneumothorax.  IMPRESSION: Stable appearance of the chest, including chronic bronchitic markings. No evidence of acute disease.   Electronically Signed   By: Monte Fantasia M.D.   On: 02/16/2015 16:37     IMPRESSION:   *  Microcytic anemia, symptomatic.  Suspect multifactorial including GI losses, anemia of late stage CKD, hypothyroidism.    *  Minor BPR, FOBT +.  Hemorrhoids on 2008 colonoscopy.  Suspect some minor rectal tearing a/w her large, formed, difficult to pass stools. Hx hyperplastic colon polyps.  Latest colonoscopy 06/2011.   *  Chronic constipation, managed effectively with Linzess.   *  Hx [name]'s esophagus, GERD; on Nexium and Ranitidine at home.   Last EGD 08/2013 with grossly normal mucosa but biopsy still showing [name]'s  *  Esophageal dysmotility.  Still quite symptomatic.  Asp PNA in 02/2014.   *  Diastolic heart failure.   *  Fatty liver on 2008 ultrasound.  No evidence portal htn or varices on 08/2013 EGD.  *  Atrial fibrillation.  Chronic Eliquis currently on hold.  Last dose was AM of 6/14  *  OSA.  O2 dependent 24/7.   *  IDDM.  Do not see a Hgb A1c in Epic.    *  Stage 4 CKD.    *  Mild elevation TSH and reduced free T3 01/26/15.  Says she was started on a thyroid medication by PMD last week, do not seen any thyroid meds on her med list however.      PLAN:     *  Per Dr Ardis Hughs.    *  ? Renal consult to address progressive ESRD, had seen Dr Florene Glen in past but not in >1 year due to being inpt so frequently.   *  Call into PMD to discover what/if thyroid med was started last week.  *  Soft, carb mod diet inplace  of clears.     Azucena Freed  02/17/2015, 9:02 AM Pager: [phone]  ________________________________________________________________________  Velora Heckler GI MD note:  I personally examined the patient, reviewed the data and agree with the assessment and plan described above.   Hb signficantly lower in past 6-7 months.  + heme.  She told me she sees red blood per rectum frequently.  Possibly this is due to hemorrhoids, her chronic constipation. Eliquis likely contributes as well.  CRF probably contributes as well.  I recommended we proceed with colonoscopy tomorrow to exclude significant neoplastic causes, confirm hemorrhoids.  Agree that renal input may be helpful to consider putting her on epo.   Debra Loffler, MD [facility] Pager [phone]

## 2015-02-17 NOTE — Progress Notes (Signed)
Echocardiogram 2D Echocardiogram with Definity has been performed.  Tresa Res 02/17/2015, 12:55 PM

## 2015-02-17 NOTE — Progress Notes (Signed)
[facility]                                                                              Patient Demographics  [name], is a 69 y.o. female, DOB - August 29, 1946, [id number]  Admit date - 02/16/2015   Admitting Physician Orson Eva, MD  Outpatient Primary MD for the patient is [name], MD  LOS - 1   Chief Complaint  Patient presents with  . Shortness of Breath       Brief HPI   69 year old female with hypertension, paroxysmal atrial fibrillation on apixiban, GERD, chronic diastolic CHF, diabetes mellitus presented with 1 month history of shortness of breath. The patient went to see her pulmonologist on admission. Routine blood work was obtained and showed hemoglobin of 7. As a result, the patient was sent to the emergency room for further evaluation. Patient had been complaining of worsening dyspnea on exertion for the past month. She has had intermittent chest discomfort at rest as well as with exertion. The patient is normally on home oxygen, 3 L nasal cannula 24 hours a day. She reported nonproductive cough but denies any hemoptysis. The patient has had subjective fevers and chills. She denied any vomiting, diarrhea, dysuria, hematuria. She did have some blood on her toilet paper when she wipes. Otherwise, she has not seen any blood in the commode. In the emergency room, the patient was afebrile hemodynamically stable without any tachycardia. Oxygen saturation was 100% on nasal cannula with 3 L. Rectal exam revealed brown stool that was minimally positive. Chest x-ray showed some mild increase in interstitial markings.   Assessment & Plan   Principal problem Acute on chronic diastolic CHF - Continue IV diuresis, daily weights and strict I's and O's. - Negative balance of 210 mL only so far - Not on ACE inhibitor/ARB due to renal insufficiency. - Continue beta blocker - 2-D echo in 7/15 had shown normal EF of 60-65%, mild LVH  Active  problems Symptomatic anemia - CT abdomen and pelvis still pending, - Last colonoscopy in 2012 had shown 3 polyps with diverticulosis, EGD was normal in 2014 - Received 2 units packed RBCs, hemoglobin currently stable, GI consulted  Diabetes mellitus type 2; uncontrolled -Obtain hemoglobin A1c, continue Lantus, sliding scale insulin   Atypical chest pain -Troponin negative, obtain 2-D echocardiogram and EKG without any concerning ischemic changes.  Chronic respiratory failure -Presently stable on 3 L nasal cannula, prior history of tobacco use and asthma - Albuterol nebs as needed  Paroxysmal Atrial fibrillation -Presently in sinus -hold Apixiban due to anemia workup -Continue metoprolol and diltiazem  GERD -Continue PPI, -Patient has history of [name]'s esophagus - GI consulted  CKD stage IV -Monitor closely with diuresis -Baseline creatinine 3.1-3.3, follow up outpatient with nephrology  Code Status: Full code  Family Communication: Discussed in detail with the patient, all imaging results, lab results explained to the patient  Disposition Plan:  Time Spent in minutes 25 minutes  Procedures  None  Consults Gastroenterology  DVT prophylaxis  SCD's  Medications  Scheduled Meds: . amiodarone  200 mg Oral Daily  . amitriptyline  25 mg Oral QHS  .  antiseptic oral rinse  7 mL Mouth Rinse BID  . atorvastatin  40 mg Oral Daily  . diltiazem  180 mg Oral Daily  . famotidine  20 mg Oral QHS  . febuxostat  40 mg Oral Daily  . furosemide  60 mg Intravenous BID  . hydrALAZINE  25 mg Oral TID  . insulin aspart  0-15 Units Subcutaneous TID WC  . insulin aspart  0-5 Units Subcutaneous QHS  . insulin glargine  10 Units Subcutaneous QHS  . [START ON 02/22/2015] metolazone  2.5 mg Oral Q Mon  . metoprolol tartrate  75 mg Oral BID  . pantoprazole (PROTONIX) IV  40 mg Intravenous Q24H  . potassium chloride SA  20 mEq Oral BID  . sodium chloride  3 mL Intravenous Q12H  .  zolpidem  10 mg Oral QHS   Continuous Infusions:  PRN Meds:.sodium chloride, acetaminophen, albuterol, HYDROcodone-acetaminophen, ondansetron (ZOFRAN) IV, sodium chloride   Antibiotics   Anti-infectives    None        Subjective:   [name] was seen and examined today. Patient denies dizziness, chest pain,  N/V/D/C, new weakness, numbess, tingling. No acute events overnight.    Objective:   Blood pressure 137/75, pulse 60, temperature 98.4 F (36.9 C), temperature source Oral, resp. rate 18, height 5\' 4"  (1.626 m), weight 110.088 kg (242 lb 11.2 oz), SpO2 100 %.  Wt Readings from Last 3 Encounters:  02/17/15 110.088 kg (242 lb 11.2 oz)  02/16/15 111.131 kg (245 lb)  01/26/15 106.595 kg (235 lb)     Intake/Output Summary (Last 24 hours) at 02/17/15 1111 Last data filed at 02/17/15 0933  Gross per 24 hour  Intake   2290 ml  Output   2500 ml  Net   -210 ml    Exam  General: Alert and oriented x 3, NAD  HEENT:  PERRLA, EOMI, Anicteric Sclera, mucous membranes moist.   Neck: Supple, no JVD, no masses  CVS: S1 S2 auscultated, no rubs, murmurs or gallops. Regular rate and rhythm.  Respiratory: Bibasilar crackles  Abdomen: Soft, nontender, nondistended, + bowel sounds  Ext: no cyanosis clubbing or edema  Neuro: AAOx3, Cr N's II- XII. Strength 5/5 upper and lower extremities bilaterally  Skin: No rashes  Psych: Normal affect and demeanor, alert and oriented x3    Data Review   Micro Results No results found for this or any previous visit (from the past 240 hour(s)).  Radiology Reports Dg Chest 2 View  02/16/2015   CLINICAL DATA:  Shortness of breath  EXAM: CHEST  2 VIEW  COMPARISON:  01/18/2015  FINDINGS: Dual-chamber pacer leads from the left are in unremarkable position. Stable normal heart size and mild aortic tortuosity. Chronic interstitial coarsening. There is no edema, consolidation, effusion, or pneumothorax.  IMPRESSION: Stable appearance of  the chest, including chronic bronchitic markings. No evidence of acute disease.   Electronically Signed   By: Monte Fantasia M.D.   On: 02/16/2015 16:37   Dg Chest 2 View  01/18/2015   CLINICAL DATA:  Shortness of breath.  EXAM: CHEST  2 VIEW  COMPARISON:  06/19/2014.  04/02/2014.  12/09/2013  FINDINGS: Mediastinum hilar structures are stable. Heart size stable. Cardiac pacer noted with lead tips in right atrium and right ventricle . Mild prominent interstitial changes with slight nodularity are noted bilaterally. These changes are most likely related chronic interstitial lung disease. A mild component of interstitial pneumonitis cannot be excluded. No pleural effusion or pneumothorax.  IMPRESSION: 1. Chronic interstitial lung disease. Superimposed mild pneumonitis cannot be excluded. 2. Cardiomegaly. Cardiac pacer noted with lead tips in the right atrium right ventricle .   Electronically Signed   By: Marcello Moores  Register   On: 01/18/2015 16:16    CBC  Recent Labs Lab 02/16/15 1138 02/16/15 1607 02/17/15 0240  WBC 8.9 8.6 6.9  HGB 7.3* 7.2* 8.9*  HCT 25.0* 26.2* 31.2*  PLT 234.0 237 191  MCV 72.2* 76.4* 77.4*  MCH  --  21.0* 22.1*  MCHC 29.4* 27.5* 28.5*  RDW 19.4* 18.4* 17.8*  LYMPHSABS 1.2  --   --   MONOABS 0.9  --   --   EOSABS 0.4  --   --   BASOSABS 0.0  --   --     Chemistries   Recent Labs Lab 02/16/15 1138 02/16/15 1607 02/17/15 0240  NA 139 138 141  K 4.3 4.3 3.8  CL 107 107 107  CO2 25 18* 22  GLUCOSE 204* 250* 80  BUN 34* 35* 31*  CREATININE 3.10* 3.41* 2.99*  CALCIUM 9.5 9.2 9.0  AST 14  --   --   ALT 12  --   --   ALKPHOS 67  --   --   BILITOT 0.4  --   --    ------------------------------------------------------------------------------------------------------------------ estimated creatinine clearance is 21.9 mL/min (by C-G formula based on Cr of  2.99). ------------------------------------------------------------------------------------------------------------------ No results for input(s): HGBA1C in the last 72 hours. ------------------------------------------------------------------------------------------------------------------ No results for input(s): CHOL, HDL, LDLCALC, TRIG, CHOLHDL, LDLDIRECT in the last 72 hours. ------------------------------------------------------------------------------------------------------------------ No results for input(s): TSH, T4TOTAL, T3FREE, THYROIDAB in the last 72 hours.  Invalid input(s): FREET3 ------------------------------------------------------------------------------------------------------------------ No results for input(s): VITAMINB12, FOLATE, FERRITIN, TIBC, IRON, RETICCTPCT in the last 72 hours.  Coagulation profile  Recent Labs Lab 02/16/15 2007  INR 1.36    No results for input(s): DDIMER in the last 72 hours.  Cardiac Enzymes No results for input(s): CKMB, TROPONINI, MYOGLOBIN in the last 168 hours.  Invalid input(s): CK ------------------------------------------------------------------------------------------------------------------ Invalid input(s): POCBNP   Recent Labs  02/16/15 2113 02/17/15 0553  GLUCAP 201* 97     RAI,RIPUDEEP M.D. [facility] 02/17/2015, 11:11 AM  Pager: [phone]   Between 7am to 7pm - call Pager - [phone]  After 7pm go to [email or web address] - password [email or web address]  Call night coverage person covering after 7pm

## 2015-02-18 ENCOUNTER — Inpatient Hospital Stay (HOSPITAL_COMMUNITY): Payer: Commercial Managed Care - HMO | Admitting: Anesthesiology

## 2015-02-18 ENCOUNTER — Encounter: Payer: Self-pay | Admitting: *Deleted

## 2015-02-18 ENCOUNTER — Encounter (HOSPITAL_COMMUNITY)
Admission: EM | Disposition: A | Discharge status: Home Health | Payer: Self-pay | Source: Home / Self Care | Attending: Internal Medicine

## 2015-02-18 ENCOUNTER — Encounter (HOSPITAL_COMMUNITY): Payer: Self-pay | Admitting: *Deleted

## 2015-02-18 ENCOUNTER — Telehealth: Payer: Self-pay | Admitting: *Deleted

## 2015-02-18 DIAGNOSIS — D509 Iron deficiency anemia, unspecified: Secondary | ICD-10-CM

## 2015-02-18 DIAGNOSIS — Q2733 Arteriovenous malformation of digestive system vessel: Secondary | ICD-10-CM

## 2015-02-18 HISTORY — PX: COLONOSCOPY WITH PROPOFOL: SHX5780

## 2015-02-18 LAB — BASIC METABOLIC PANEL
Anion gap: 11 (ref 5–15)
BUN: 28 mg/dL — ABNORMAL HIGH (ref 6–20)
CO2: 27 mmol/L (ref 22–32)
Calcium: 9.5 mg/dL (ref 8.9–10.3)
Chloride: 105 mmol/L (ref 101–111)
Creatinine, Ser: 2.89 mg/dL — ABNORMAL HIGH (ref 0.44–1.00)
GFR calc Af Amer: 18 mL/min — ABNORMAL LOW (ref >60)
GFR calc non Af Amer: 16 mL/min — ABNORMAL LOW (ref >60)
Glucose, Bld: 125 mg/dL — ABNORMAL HIGH (ref 65–99)
Potassium: 3.7 mmol/L (ref 3.5–5.1)
Sodium: 143 mmol/L (ref 135–145)

## 2015-02-18 LAB — HEMOGLOBIN A1C
Hgb A1c MFr Bld: 7.2 % — ABNORMAL HIGH (ref 4.8–5.6)
Hgb A1c MFr Bld: 6.9 % — ABNORMAL HIGH (ref 4.8–5.6)
Mean Plasma Glucose: 160 mg/dL
Mean Plasma Glucose: 151 mg/dL

## 2015-02-18 LAB — CBC
HCT: 32.2 % — ABNORMAL LOW (ref 36.0–46.0)
Hemoglobin: 9.3 g/dL — ABNORMAL LOW (ref 12.0–15.0)
MCH: 22.4 pg — ABNORMAL LOW (ref 26.0–34.0)
MCHC: 28.9 g/dL — ABNORMAL LOW (ref 30.0–36.0)
MCV: 77.6 fL — ABNORMAL LOW (ref 78.0–100.0)
Platelets: 205 10*3/uL (ref 150–400)
RBC: 4.15 MIL/uL (ref 3.87–5.11)
RDW: 18.2 % — ABNORMAL HIGH (ref 11.5–15.5)
WBC: 7.3 10*3/uL (ref 4.0–10.5)

## 2015-02-18 LAB — GLUCOSE, CAPILLARY
Glucose-Capillary: 110 mg/dL — ABNORMAL HIGH (ref 65–99)
Glucose-Capillary: 107 mg/dL — ABNORMAL HIGH (ref 65–99)
Glucose-Capillary: 208 mg/dL — ABNORMAL HIGH (ref 65–99)

## 2015-02-18 SURGERY — COLONOSCOPY WITH PROPOFOL
Anesthesia: Monitor Anesthesia Care

## 2015-02-18 MED ORDER — SODIUM CHLORIDE 0.9 % IV SOLN
INTRAVENOUS | Status: DC | PRN
  Administered 2015-02-18: 08:00:00 via INTRAVENOUS

## 2015-02-18 MED ORDER — FENTANYL CITRATE (PF) 100 MCG/2ML IJ SOLN
INTRAMUSCULAR | Status: DC | PRN
  Administered 2015-02-18 (×2): 50 ug via INTRAVENOUS

## 2015-02-18 MED ORDER — FERROUS FUMARATE 325 (106 FE) MG PO TABS
1.0000 | ORAL_TABLET | Freq: Every day | ORAL | Status: DC
  Administered 2015-02-18: 106 mg via ORAL
  Filled 2015-02-18 (×2): qty 1

## 2015-02-18 MED ORDER — PROPOFOL 10 MG/ML IV BOLUS
INTRAVENOUS | Status: DC | PRN
  Administered 2015-02-18: 30 mg via INTRAVENOUS

## 2015-02-18 MED ORDER — ONDANSETRON HCL 4 MG/2ML IJ SOLN
INTRAMUSCULAR | Status: DC | PRN
  Administered 2015-02-18: 4 mg via INTRAVENOUS

## 2015-02-18 MED ORDER — POLYETHYLENE GLYCOL 3350 17 G PO PACK
17.0000 g | PACK | Freq: Every day | ORAL | Status: DC
  Administered 2015-02-19: 17 g via ORAL
  Filled 2015-02-18 (×2): qty 1

## 2015-02-18 MED ORDER — SODIUM CHLORIDE 0.9 % IV SOLN
INTRAVENOUS | Status: DC
  Administered 2015-02-18: 500 mL via INTRAVENOUS

## 2015-02-18 MED ORDER — MIDAZOLAM HCL 5 MG/5ML IJ SOLN
INTRAMUSCULAR | Status: DC | PRN
  Administered 2015-02-18: 1 mg via INTRAVENOUS

## 2015-02-18 NOTE — Op Note (Addendum)
[facility] [place], [address]   COLONOSCOPY PROCEDURE REPORT  PATIENT: [name], [name]  MR#: [id number] BIRTHDATE: [birth year] , 68  yrs. old GENDER: female ENDOSCOPIST: Milus Banister, MD PROCEDURE DATE:  02/18/2015 PROCEDURE:   Colonoscopy with control of bleeding First Screening Colonoscopy - Avg.  risk and is 50 yrs.  old or older - No.  Prior Negative Screening - Now for repeat screening. N/A  History of Adenoma - Now for follow-up colonoscopy & has been > or = to 3 yrs.  N/A ASA CLASS:   Class III INDICATIONS:anemia, fobt + stool. MEDICATIONS: Monitored anesthesia care  DESCRIPTION OF PROCEDURE:   After the risks benefits and alternatives of the procedure were thoroughly explained, informed consent was obtained.  The digital rectal exam revealed no abnormalities of the rectum.   The [employer]ax Adult Colon [id number] endoscope was introduced through the anus and advanced to the cecum, which was identified by both the appendix and ileocecal valve. No adverse events experienced.   The quality of the prep was good.  The instrument was then slowly withdrawn as the colon was fully examined. Estimated blood loss is zero unless otherwise noted in this procedure report.  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  This was ablated with APC treatment. There were medium sized internal hemorrhoids and small external hemorrhoids.  The examination was otherwise normal.  Retroflexed views revealed no abnormalities. The time to cecum = 5 min Withdrawal time = 8 min  The scope was withdrawn and the procedure completed. COMPLICATIONS: There were no immediate complications.  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  This was ablated with APC treatment.  There were medium sized internal hemorrhoids and small external hemorrhoids.  The  examination was otherwise normal  RECOMMENDATIONS: Can resume eliquis in 2-3 days.  Will start miralax 17gm once daily to try to prevent constipation, straining.  This may help decrease hemorrhoidal bleeding.  She will be contacted about return office appt in 6-7 weeks in GI with Dr.  Olevia Perches, CBC the day prior.  She should begin iron supplement once daily, probably indefinitely.  If blood counts fail to improve then will need to consider further testing (repeat EGD, last was 1 1/2 years ago, perhaps enteroscopy or SB capsule).  Would still recommend renal input to consider epogen or other meds for renal contribution to her anemia.  eSigned:  Milus Banister, MD 02/18/2015 9:33 AM Revised: 02/18/2015 9:33 AM  cc: Delfin Edis, MD

## 2015-02-18 NOTE — Anesthesia Postprocedure Evaluation (Signed)
Anesthesia Post Note  Patient: [name]  Procedure(s) Performed: Procedure(s) (LRB): COLONOSCOPY WITH PROPOFOL (N/A)  Anesthesia type: MAC  Patient location: PACU  Post pain: Pain level controlled and Adequate analgesia  Post assessment: Post-op Vital signs reviewed, Patient's Cardiovascular Status Stable and Respiratory Function Stable  Last Vitals:  Filed Vitals:   02/18/15 1013  BP: 148/67  Pulse: 60  Temp: 36.5 C  Resp:     Post vital signs: Reviewed and stable  Level of consciousness: awake, alert  and oriented  Complications: No apparent anesthesia complications

## 2015-02-18 NOTE — Progress Notes (Signed)
CM CONSULT Talked to patient about DCP; patient's daughter stays with her at her home. Patient was active with [facility] as prior to admission.  PCP Dr Trilby Drummer - she saw him 1 wk ago Cardiologist- Wak[place], Daniel,MD Patient has private insurance with [facility] Medicare with prescription drug coverage- no problem getting her medication.  Patient has scales at home and tries to weigh herself daily.  Her daughter does the majority of the cooking - heart healthy diet No energy to exercise, receiving [place] services Patient would benefit from a Disease Management program for CHF/ [facility] at discharge [facility] [phone]

## 2015-02-18 NOTE — Telephone Encounter (Signed)
-----   Message from Barron Alvine, [place] sent at 02/18/2015  9:38 AM EDT -----   ----- Message -----    From: Milus Banister, MD    Sent: 02/18/2015   9:30 AM      To: Barron Alvine, CMA  She needs rov in 7-8 weeks with [facility], CBC a few days prior  thanks

## 2015-02-18 NOTE — Progress Notes (Signed)
[facility]                                                                              Patient Demographics  [name], is a 69 y.o. female, DOB - July 02, 1946, [id number]  Admit date - 02/16/2015   Admitting Physician Orson Eva, MD  Outpatient Primary MD for the patient is [name], MD  LOS - 2   Chief Complaint  Patient presents with  . Shortness of Breath       Brief HPI   69 year old female with hypertension, paroxysmal atrial fibrillation on apixiban, GERD, chronic diastolic CHF, diabetes mellitus presented with 1 month history of shortness of breath. The patient went to see her pulmonologist on admission. Routine blood work was obtained and showed hemoglobin of 7. As a result, the patient was sent to the emergency room for further evaluation. Patient had been complaining of worsening dyspnea on exertion for the past month. She has had intermittent chest discomfort at rest as well as with exertion. The patient is normally on home oxygen, 3 L nasal cannula 24 hours a day. She reported nonproductive cough but denies any hemoptysis. The patient has had subjective fevers and chills. She denied any vomiting, diarrhea, dysuria, hematuria. She did have some blood on her toilet paper when she wipes. Otherwise, she has not seen any blood in the commode. In the emergency room, the patient was afebrile hemodynamically stable without any tachycardia. Oxygen saturation was 100% on nasal cannula with 3 L. Rectal exam revealed brown stool that was minimally positive. Chest x-ray showed some mild increase in interstitial markings.   Assessment & Plan   Principal problem Acute on chronic diastolic CHF - Continue IV diuresis, daily weights and strict I's and O's., Hopefully, transition to oral Lasix tomorrow. - Not on ACE inhibitor/ARB due to renal insufficiency. - Continue beta blocker - 2-D echo in 7/15 had shown normal EF of 60-65%, mild LVH  Active  problems Symptomatic anemia - CT abdomen and pelvis negative, GI consulted, - Received 2 units packed RBCs, hemoglobin currently stable - Underwent colonoscopy, showed cherry-red to 7 mm AVM in the cecum adjacent to the appendiceal orifice, ablated, medium-sized internal hemorrhoids and small external hemorrhoids Per GI can resume eliquis in 2-3 days, MiraLAX daily and follow up with Dr. Maurene Capes 6-7 weeks. If blood counts failed to improve, may need repeat endoscopy. - I also requested nephrology, will assess outpatient for epogen therapy , currently hemoglobin stable, continue iron replacement  Diabeteses mellitus type 2; uncontrolled - hemoglobin A1c 6.9, CBGs somewhat uncontrolled, continue Lantus, sliding scale insulin   Atypical chest pain -Troponin negative, 2-D echo showed EF of 123456, grade 2 diastolic dysfunction   Chronic respiratory failure -Presently stable on 3 L nasal cannula, prior history of tobacco use and asthma - Albuterol nebs as needed  Paroxysmal Atrial fibrillation -Presently in sinus -hold Apixiban  -Continue metoprolol and diltiazem  GERD -Continue PPI, -Patient has history of [name]'s esophagus - GI consulted  CKD stage IV -Monitor closely with diuresis -Baseline creatinine 3.1-3.3, follow up outpatient with nephrology  Code Status: Full code  Family Communication: Discussed in detail with  the patient, all imaging results, lab results explained to the patient  Disposition Plan:DC home in a.m. if H&H stable   Time Spent in minutes 25 minutes  Procedures  None  Consults Gastroenterology  DVT prophylaxis  SCD's  Medications  Scheduled Meds: . amiodarone  200 mg Oral Daily  . amitriptyline  25 mg Oral QHS  . antiseptic oral rinse  7 mL Mouth Rinse BID  . atorvastatin  40 mg Oral Daily  . diltiazem  180 mg Oral Daily  . famotidine  20 mg Oral QHS  . febuxostat  40 mg Oral Daily  . ferrous fumarate  1 tablet Oral Q breakfast  .  furosemide  60 mg Intravenous BID  . hydrALAZINE  25 mg Oral TID  . insulin aspart  0-15 Units Subcutaneous TID WC  . insulin aspart  0-5 Units Subcutaneous QHS  . insulin glargine  10 Units Subcutaneous QHS  . [START ON 02/22/2015] metolazone  2.5 mg Oral Q Mon  . metoprolol tartrate  75 mg Oral BID  . pantoprazole  40 mg Oral Q0600  . polyethylene glycol  17 g Oral Daily  . potassium chloride SA  20 mEq Oral BID  . sodium chloride  3 mL Intravenous Q12H  . zolpidem  10 mg Oral QHS   Continuous Infusions:  PRN Meds:.sodium chloride, acetaminophen, albuterol, HYDROcodone-acetaminophen, ondansetron (ZOFRAN) IV, sodium chloride   Antibiotics   Anti-infectives    None        Subjective:   [name] was seen and examined today. feeling better, no active bleeding, no chest pain or shortness of breath, states peripheral edema improving  Patient denies dizziness, chest pain,  N/V/D/C, new weakness, numbess, tingling. No acute events overnight.    Objective:   Blood pressure 148/67, pulse 60, temperature 97.7 F (36.5 C), temperature source Oral, resp. rate 13, height 5\' 4"  (1.626 m), weight 110.315 kg (243 lb 3.2 oz), SpO2 98 %.  Wt Readings from Last 3 Encounters:  02/18/15 110.315 kg (243 lb 3.2 oz)  02/16/15 111.131 kg (245 lb)  01/26/15 106.595 kg (235 lb)     Intake/Output Summary (Last 24 hours) at 02/18/15 1229 Last data filed at 02/18/15 1013  Gross per 24 hour  Intake   2520 ml  Output   1450 ml  Net   1070 ml    Exam  General: Alert and oriented x 3, NAD  HEENT:  PERRLA, EOMI, Anicteric Sclera, mucous membranes moist.   Neck: Supple, no JVD, no masses  CVS: S1 S2 auscultated, no rubs, murmurs or gallops. Regular rate and rhythm.  RespiratoryDecreased breath sounds at the bases  Abdomen: Soft, nontender, nondistended, + bowel sounds  Ext: no cyanosis clubbing or edema  Neuro: AAOx3, Cr N's II- XII. Strength 5/5 upper and lower extremities  bilaterally  Skin: No rashes  Psych: Normal affect and demeanor, alert and oriented x3    Data Review   Micro Results No results found for this or any previous visit (from the past 240 hour(s)).  Radiology Reports Ct Abdomen Pelvis Wo Contrast  02/17/2015   CLINICAL DATA:  68 year old female with abdominal pain and blood loss. History of diverticulosis. Anemia.  EXAM: CT ABDOMEN AND PELVIS WITHOUT CONTRAST  TECHNIQUE: Multidetector CT imaging of the abdomen and pelvis was performed following the standard protocol without IV contrast.  COMPARISON:  Ultrasound 02/19/2009  FINDINGS: Lower chest: Mild ground-glass opacities at lung base. No nodularity. No pleural fluid.  Hepatobiliary: No focal hepatic  lesions non contrast exam. Gallbladder normal.  Pancreas: Pancreas is normal. No ductal dilatation. No pancreatic inflammation.  Spleen: Normal spleen  Adrenals/urinary tract: Adrenal glands are mildly nodular. No nephrolithiasis or ureterolithiasis. Bladder is.  Stomach/Bowel: Stomach, small bowel, appendix, and cecum are normal. The colon and rectosigmoid colon are normal.  Vascular/Lymphatic: Abdominal aorta is normal caliber with atherosclerotic calcification. There is no retroperitoneal or periportal lymphadenopathy. No pelvic lymphadenopathy.  Reproductive: Uterus and ovaries are normal.  Musculoskeletal: No pelvic.  Other: No acute osseous abnormality.  IMPRESSION: 1. No abnormality of the bowel. No obstruction or inflammation. No explanation for anemia. 2. Atherosclerotic calcification the aorta. 3. Mild adrenal nodularity likely represents hyperplasia   Electronically Signed   By: Suzy Bouchard M.D.   On: 02/17/2015 14:23   Dg Chest 2 View  02/16/2015   CLINICAL DATA:  Shortness of breath  EXAM: CHEST  2 VIEW  COMPARISON:  01/18/2015  FINDINGS: Dual-chamber pacer leads from the left are in unremarkable position. Stable normal heart size and mild aortic tortuosity. Chronic interstitial  coarsening. There is no edema, consolidation, effusion, or pneumothorax.  IMPRESSION: Stable appearance of the chest, including chronic bronchitic markings. No evidence of acute disease.   Electronically Signed   By: Monte Fantasia M.D.   On: 02/16/2015 16:37    CBC  Recent Labs Lab 02/16/15 1138 02/16/15 1607 02/17/15 0240 02/18/15 0416  WBC 8.9 8.6 6.9 7.3  HGB 7.3* 7.2* 8.9* 9.3*  HCT 25.0* 26.2* 31.2* 32.2*  PLT 234.0 237 191 205  MCV 72.2* 76.4* 77.4* 77.6*  MCH  --  21.0* 22.1* 22.4*  MCHC 29.4* 27.5* 28.5* 28.9*  RDW 19.4* 18.4* 17.8* 18.2*  LYMPHSABS 1.2  --   --   --   MONOABS 0.9  --   --   --   EOSABS 0.4  --   --   --   BASOSABS 0.0  --   --   --     Chemistries   Recent Labs Lab 02/16/15 1138 02/16/15 1607 02/17/15 0240 02/18/15 0416  NA 139 138 141 143  K 4.3 4.3 3.8 3.7  CL 107 107 107 105  CO2 25 18* 22 27  GLUCOSE 204* 250* 80 125*  BUN 34* 35* 31* 28*  CREATININE 3.10* 3.41* 2.99* 2.89*  CALCIUM 9.5 9.2 9.0 9.5  AST 14  --   --   --   ALT 12  --   --   --   ALKPHOS 67  --   --   --   BILITOT 0.4  --   --   --    ------------------------------------------------------------------------------------------------------------------ estimated creatinine clearance is 22.6 mL/min (by C-G formula based on Cr of 2.89). ------------------------------------------------------------------------------------------------------------------  Recent Labs  02/16/15 2007 02/17/15 0240  HGBA1C 7.2* 6.9*   ------------------------------------------------------------------------------------------------------------------ No results for input(s): CHOL, HDL, LDLCALC, TRIG, CHOLHDL, LDLDIRECT in the last 72 hours. ------------------------------------------------------------------------------------------------------------------ No results for input(s): TSH, T4TOTAL, T3FREE, THYROIDAB in the last 72 hours.  Invalid input(s):  FREET3 ------------------------------------------------------------------------------------------------------------------ No results for input(s): VITAMINB12, FOLATE, FERRITIN, TIBC, IRON, RETICCTPCT in the last 72 hours.  Coagulation profile  Recent Labs Lab 02/16/15 2007  INR 1.36    No results for input(s): DDIMER in the last 72 hours.  Cardiac Enzymes No results for input(s): CKMB, TROPONINI, MYOGLOBIN in the last 168 hours.  Invalid input(s): CK ------------------------------------------------------------------------------------------------------------------ Invalid input(s): [place]  02/17/15 0553 02/17/15 1117 02/17/15 1634 02/17/15 2155 02/18/15 0546 02/18/15 1144  GLUCAP  57 101* 258* 110* 110* 107*     Hampton Cost M.D. [facility] 02/18/2015, 12:29 PM  Pager: [phone]   Between 7am to 7pm - call Pager - [phone]  After 7pm go to [email or web address] - password [email or web address]  Call night coverage person covering after 7pm

## 2015-02-18 NOTE — Progress Notes (Signed)
Pt refused CPAP for tonight. Pt states she wears CPAP sometimes at home but rarely keeps it on throughout the night because she's claustrophobic. Pt was encouraged to contact RT if she changes her mind about wearing CPAP. Pt remains on 3Lpm nasal cannula. RT will continue to monitor as needed.

## 2015-02-18 NOTE — Anesthesia Preprocedure Evaluation (Signed)
Anesthesia Evaluation  Patient identified by MRN, date of birth, ID band Patient awake    Reviewed: Allergy & Precautions, NPO status , Patient's Chart, lab work & pertinent test results  Airway Mallampati: II   Neck ROM: full    Dental   Pulmonary asthma , sleep apnea , former smoker,  breath sounds clear to auscultation        Cardiovascular hypertension, + angina + CAD, + Past MI, + Peripheral Vascular Disease and +CHF Rhythm:regular Rate:Normal     Neuro/Psych  Neuromuscular disease    GI/Hepatic GERD-  ,  Endo/Other  diabetes, Type 2Morbid obesity  Renal/GU Renal InsufficiencyRenal disease     Musculoskeletal  (+) Arthritis -,   Abdominal   Peds  Hematology   Anesthesia Other Findings   Reproductive/Obstetrics                             Anesthesia Physical Anesthesia Plan  ASA: III  Anesthesia Plan: MAC   Post-op Pain Management:    Induction: Intravenous  Airway Management Planned: Simple Face Mask  Additional Equipment:   Intra-op Plan:   Post-operative Plan:   Informed Consent: I have reviewed the patients History and Physical, chart, labs and discussed the procedure including the risks, benefits and alternatives for the proposed anesthesia with the patient or authorized representative who has indicated his/her understanding and acceptance.     Plan Discussed with: CRNA, Anesthesiologist and Surgeon  Anesthesia Plan Comments:         Anesthesia Quick Evaluation

## 2015-02-18 NOTE — Telephone Encounter (Signed)
Letter mailed to patient with appointment.

## 2015-02-18 NOTE — Transfer of Care (Signed)
Immediate Anesthesia Transfer of Care Note  Patient: [name]  Procedure(s) Performed: Procedure(s): COLONOSCOPY WITH PROPOFOL (N/A)  Patient Location: PACU and Endoscopy Unit  Anesthesia Type:MAC  Level of Consciousness: awake, alert , oriented and patient cooperative  Airway & Oxygen Therapy: Patient Spontanous Breathing and Patient connected to nasal cannula oxygen  Post-op Assessment: Report given to RN, Post -op Vital signs reviewed and stable and Patient moving all extremities X 4  Post vital signs: Reviewed and stable  Last Vitals:  Filed Vitals:   02/18/15 0756  BP: 164/100  Pulse: 60  Temp: 36.6 C  Resp: 16    Complications: No apparent anesthesia complications

## 2015-02-18 NOTE — Consult Note (Signed)
[name] Admit Date: 02/16/2015 02/18/2015 Rexene Agent Requesting Physician:  Rai MD  Reason for Consult:  Anemia in CKD HPI:  [name] seen at request of Dr. Tana Coast fo revaluation of Anemia in CKD.  Patient follows in our office with Dr. Florene Glen. She has stable late stage CKD, creatinine trend is below, etiology presumed secondary to hypertension. She was last seen in our office in August. She was admitted from pulmonary clinic on the 14th with progressive dyspnea on exertion found to have a hemoglobin of 7. Other pertinent past history includes atrial fibrillation on apixaban, chronic diastolic heart failure, type 2 diabetes, GERD. She endorsed blood streaking on the toilet paper and was found to be occult blood positive in her stool. She was transfused 2 units of packed red cells. She was also felt to be hypervolemic and started on IV diuresis.  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 Her hemoglobin was normal in February of this year when her creatinine was the same as currently. Her MCV on admission was 72.2.   CREATININE, SER (mg/dL)  Date Value  02/18/2015 2.89*  02/17/2015 2.99*  02/16/2015 3.41*  02/16/2015 3.10*  01/26/2015 3.93*  01/18/2015 3.37*  10/28/2014 3.03*  10/14/2014 3.13*  10/05/2014 3.93*  07/21/2014 2.55*  ] I/Os:  ROS Balance of 12 systems is negative w/ exceptions as above  PMH  Past Medical History  Diagnosis Date  . Atrial flutter     ablated by Dr Lovena Le in 2008  . Hypertension   . IDDM (insulin dependent diabetes mellitus)     type 2.   . Diastolic heart failure 0000000    grade 2 diastolic dysfunction per [id number] echo  . Asthma   . Hyperlipidemia   . Fatty liver 2008    noted on ultrasound 2008  . Esophageal dysmotility 2008    noted on esophagram.  hx dysphagia.   . Arthritis   . Sleep apnea     wears CPAP  . Fatty tumor fatty tumor back  . Coronary atherosclerosis of  native coronary artery   . Morbid obesity   . Myocardial infarction 2009  . Heart murmur   . Peripheral vascular disease   . GERD (gastroesophageal reflux disease) 2012    Barrets esophagus on bx 2012 and 2014.   . Anginal pain     occ; non-ischemic Lexiscan 09/2012  . CKD (chronic kidney disease) 04/2007    CKD stage 4(Dr. Erling Cruz)  . Complication of anesthesia     " DIFFICULTY BREATHING "  . Persistent atrial fibrillation     chads2 vasc score of at least 5  . Sick sinus syndrome    PSH  Past Surgical History  Procedure Laterality Date  . Coronary angioplasty with stent placement    . Breast lumpectomy      right  . Tubal ligation    . Tonsillectomy    . Total knee arthroplasty Right 12/15/2013    Procedure: RIGHT TOTAL KNEE ARTHROPLASTY;  Surgeon: Alta Corning, MD;  Location: [facility];  Service: Orthopedics;  Laterality: Right;  . Cardioversion N/A 03/03/2014    Procedure: CARDIOVERSION;  Surgeon: Laverda Page, MD;  Location: [facility];  Service: Cardiovascular;  Laterality: N/A;  . Atrial flutter ablation  2008    CTI ablation by Dr Lovena Le  . Pacemaker insertion  06/18/14    STJ Assurity dual chamber pacemaker implanted by Dr Rayann Heman  .  Right heart catheterization N/A 06/12/2014    Procedure: RIGHT HEART CATH;  Surgeon: Jolaine Artist, MD;  Location: [facility] CATH LAB;  Service: Cardiovascular;  Laterality: N/A;  . Permanent pacemaker insertion N/A 06/18/2014    SJM Assurity DR pacemaker implanted by Dr Rayann Heman for sick sinus syndrome   FH  Family History  Problem Relation Age of Onset  . Heart disease Mother   . Cancer Mother     bladder  . Kidney disease Mother   . Ovarian cancer Daughter   . Stomach cancer Maternal Uncle   . Colon cancer Maternal Aunt   . Esophageal cancer Neg Hx    SH  reports that she quit smoking about 20 years ago. Her smoking use included Cigarettes. She has a 17.5 pack-year smoking history. She has never used smokeless tobacco. She  reports that she does not drink alcohol or use illicit drugs. Allergies  Allergies  Allergen Reactions  . Adhesive [Tape] Itching    EKG leads  . Sulfa Antibiotics Itching and Nausea And Vomiting    "everything I seen was red"  . Codeine Nausea And Vomiting  . Penicillins Nausea And Vomiting   Home medications Prior to Admission medications   Medication Sig Start Date End Date Taking? Authorizing Provider  acetaminophen (TYLENOL) 500 MG tablet Take 500 mg by mouth every 6 (six) hours as needed for mild pain.   Yes Historical Provider, MD  albuterol (PROVENTIL) (2.5 MG/3ML) 0.083% nebulizer solution Take 2.5 mg by nebulization every 6 (six) hours as needed for shortness of breath. Use four times a day as needed for shortness of breath or wheezing 08/24/14  Yes Historical Provider, MD  amiodarone (PACERONE) 200 MG tablet Take 1 tablet (200 mg total) by mouth daily. 08/19/14  Yes Jolaine Artist, MD  amitriptyline (ELAVIL) 25 MG tablet Take 25 mg by mouth at bedtime.     Yes Historical Provider, MD  apixaban (ELIQUIS) 5 MG TABS tablet Take 1 tablet (5 mg total) by mouth 2 (two) times daily. 10/28/14  Yes Jolaine Artist, MD  atorvastatin (LIPITOR) 40 MG tablet Take 1 tablet (40 mg total) by mouth daily. 04/29/14  Yes Jolaine Artist, MD  dicyclomine (BENTYL) 10 MG capsule Take 10 mg by mouth daily.    Yes Historical Provider, MD  diltiazem (CARDIZEM CD) 180 MG 24 hr capsule Take 1 capsule (180 mg total) by mouth daily. 12/01/14  Yes Jolaine Artist, MD  esomeprazole (NEXIUM) 40 MG capsule Take 1 capsule (40 mg total) by mouth daily at 12 noon. 04/29/14  Yes Jolaine Artist, MD  febuxostat (ULORIC) 40 MG tablet Take 40 mg by mouth daily.    Yes Historical Provider, MD  hydrALAZINE (APRESOLINE) 25 MG tablet Take 1 tablet (25 mg total) by mouth 3 (three) times daily. 10/28/14  Yes Jolaine Artist, MD  HYDROcodone-acetaminophen (NORCO/VICODIN) 5-325 MG per tablet Take 1 tablet by mouth  every 6 (six) hours as needed. Pain 08/18/14  Yes Historical Provider, MD  insulin glargine (LANTUS) 100 UNIT/ML injection Inject 0.2 mLs (20 Units total) into the skin at bedtime. Patient taking differently: Inject 20 Units into the skin daily as needed (high blood sugar).  06/20/14  Yes Hosie Poisson, MD  insulin lispro (HUMALOG) 100 UNIT/ML injection Inject 0.2 mLs (20 Units total) into the skin 3 (three) times daily after meals. Patient taking differently: Inject 50 Units into the skin every morning.  12/23/13  Yes Delfina Redwood, MD  Linaclotide (  LINZESS) 145 MCG CAPS capsule Take 145 mcg by mouth daily.    Yes Historical Provider, MD  metolazone (ZAROXOLYN) 2.5 MG tablet Take 1 tablet (2.5 mg total) by mouth once a week. MONDAYS 01/26/15  Yes Larey Dresser, MD  metoprolol tartrate (LOPRESSOR) 25 MG tablet TAKE THREE TABLETS BY MOUTH TWICE DAILY 01/26/15  Yes Jolaine Artist, MD  nitroGLYCERIN (NITROSTAT) 0.4 MG SL tablet Place 0.4 mg under the tongue every 5 (five) minutes as needed for chest pain.    Yes Historical Provider, MD  potassium chloride SA (K-DUR,KLOR-CON) 20 MEQ tablet Take 3 tabs in the AM and 2 tabs in the PM 01/18/15  Yes Larey Dresser, MD  ranitidine (ZANTAC) 150 MG tablet Take 1 tablet (150 mg total) by mouth at bedtime. 01/26/15  Yes Larey Dresser, MD  torsemide (DEMADEX) 20 MG tablet Take 2 tablets (40 mg total) by mouth 2 (two) times daily. 01/18/15  Yes Larey Dresser, MD  Vitamin D, Ergocalciferol, (DRISDOL) 50000 UNITS CAPS capsule Take 50,000 Units by mouth every Monday.   Yes Historical Provider, MD  XOPENEX HFA 45 MCG/ACT inhaler Inhale 2 puffs into the lungs every 4 (four) hours as needed for wheezing or shortness of breath. 03/11/14  Yes Marijean Heath, NP  zolpidem (AMBIEN) 10 MG tablet Take 10 mg by mouth at bedtime.    Yes Historical Provider, MD    Current Medications Scheduled Meds: . amiodarone  200 mg Oral Daily  . amitriptyline  25 mg Oral QHS   . antiseptic oral rinse  7 mL Mouth Rinse BID  . atorvastatin  40 mg Oral Daily  . diltiazem  180 mg Oral Daily  . famotidine  20 mg Oral QHS  . febuxostat  40 mg Oral Daily  . ferrous fumarate  1 tablet Oral Q breakfast  . furosemide  60 mg Intravenous BID  . hydrALAZINE  25 mg Oral TID  . insulin aspart  0-15 Units Subcutaneous TID WC  . insulin aspart  0-5 Units Subcutaneous QHS  . insulin glargine  10 Units Subcutaneous QHS  . [START ON 02/22/2015] metolazone  2.5 mg Oral Q Mon  . metoprolol tartrate  75 mg Oral BID  . pantoprazole  40 mg Oral Q0600  . polyethylene glycol  17 g Oral Daily  . potassium chloride SA  20 mEq Oral BID  . sodium chloride  3 mL Intravenous Q12H  . zolpidem  10 mg Oral QHS   Continuous Infusions:  PRN Meds:.sodium chloride, acetaminophen, albuterol, HYDROcodone-acetaminophen, ondansetron (ZOFRAN) IV, sodium chloride  CBC  Recent Labs Lab 02/16/15 1138 02/16/15 1607 02/17/15 0240 02/18/15 0416  WBC 8.9 8.6 6.9 7.3  NEUTROABS 6.4  --   --   --   HGB 7.3* 7.2* 8.9* 9.3*  HCT 25.0* 26.2* 31.2* 32.2*  MCV 72.2* 76.4* 77.4* 77.6*  PLT 234.0 237 191 [id number]   Basic Metabolic Panel  Recent Labs Lab 02/16/15 1138 02/16/15 1607 02/17/15 0240 02/18/15 0416  NA 139 138 141 143  K 4.3 4.3 3.8 3.7  CL 107 107 107 105  CO2 25 18* 22 27  GLUCOSE 204* 250* 80 125*  BUN 34* 35* 31* 28*  CREATININE 3.10* 3.41* 2.99* 2.89*  CALCIUM 9.5 9.2 9.0 9.5    Physical Exam  Blood pressure 148/67, pulse 60, temperature 97.7 F (36.5 C), temperature source Oral, resp. rate 13, height 5\' 4"  (1.626 m), weight 110.315 kg (243 lb 3.2 oz), SpO2 98 %.  GEN: NAD, morbidly obese ENT: NCAT, [place] in place EYES: EOMI CV: RRR, no rub PULM: CTAB on ant auscultation ABD: s/t/nd, +BS SKIN: no rashes/lesons EXT:1+ LEE   Assessment/Plan 39F with symptomatic anemia in setting of CKD4, FOBT positive on NOAC and CSY with AVM and hemorroids.  Her hemoglobin has dropped  suddenly from when last checked 4 months ago. I would've expected a more indolent progression if this was primarily due to her CKD. Certainly she might of had a insufficient response to chronic GI loss because of her CKD.  She needs to reestablish care with Dr. Florene Glen and I will arrange that. I am hesitant to start an erythropoietin agent at the current time because of the hassle and expense until we clearly demonstrate persistent anemia in the absence of ongoing GI losses.  1. CKD4 1. Stable, no recent evidence of progression 2. Needs to return to see Dr. Florene Glen, discuss RRT modalities and planning 3. Will arrange 2. Microcytic Anemia 1. 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 On PO Fe 3. Will cont to follow as outpt and assess need for ESA therapy 3. Chronic diastolic HF 4. DM2 5. Morbid obesity  Will sign off for now.  Please call with any questions or concerns.  Pt does need follow up with nephrology and will arrange with Dr. Johney Maine MD [phone] pgr 02/18/2015, 11:27 AM

## 2015-02-19 ENCOUNTER — Telehealth: Payer: Self-pay | Admitting: Pulmonary Disease

## 2015-02-19 ENCOUNTER — Encounter (HOSPITAL_COMMUNITY): Payer: Self-pay | Admitting: Gastroenterology

## 2015-02-19 ENCOUNTER — Telehealth: Payer: Self-pay | Admitting: *Deleted

## 2015-02-19 ENCOUNTER — Inpatient Hospital Stay: Admission: RE | Admit: 2015-02-19 | Payer: Commercial Managed Care - HMO | Source: Ambulatory Visit

## 2015-02-19 DIAGNOSIS — Z595 Extreme poverty: Secondary | ICD-10-CM

## 2015-02-19 DIAGNOSIS — IMO0001 Reserved for inherently not codable concepts without codable children: Secondary | ICD-10-CM

## 2015-02-19 LAB — BASIC METABOLIC PANEL
Anion gap: 8 (ref 5–15)
BUN: 28 mg/dL — ABNORMAL HIGH (ref 6–20)
CO2: 27 mmol/L (ref 22–32)
Calcium: 9 mg/dL (ref 8.9–10.3)
Chloride: 106 mmol/L (ref 101–111)
Creatinine, Ser: 2.98 mg/dL — ABNORMAL HIGH (ref 0.44–1.00)
GFR calc Af Amer: 18 mL/min — ABNORMAL LOW (ref >60)
GFR calc non Af Amer: 15 mL/min — ABNORMAL LOW (ref >60)
Glucose, Bld: 193 mg/dL — ABNORMAL HIGH (ref 65–99)
Potassium: 3.9 mmol/L (ref 3.5–5.1)
Sodium: 141 mmol/L (ref 135–145)

## 2015-02-19 LAB — GLUCOSE, CAPILLARY
Glucose-Capillary: 135 mg/dL — ABNORMAL HIGH (ref 65–99)
Glucose-Capillary: 183 mg/dL — ABNORMAL HIGH (ref 65–99)

## 2015-02-19 LAB — CBC
HCT: 28.9 % — ABNORMAL LOW (ref 36.0–46.0)
Hemoglobin: 8.3 g/dL — ABNORMAL LOW (ref 12.0–15.0)
MCH: 22.7 pg — ABNORMAL LOW (ref 26.0–34.0)
MCHC: 28.7 g/dL — ABNORMAL LOW (ref 30.0–36.0)
MCV: 79.2 fL (ref 78.0–100.0)
Platelets: 183 10*3/uL (ref 150–400)
RBC: 3.65 MIL/uL — ABNORMAL LOW (ref 3.87–5.11)
RDW: 18.6 % — ABNORMAL HIGH (ref 11.5–15.5)
WBC: 7.7 10*3/uL (ref 4.0–10.5)

## 2015-02-19 MED ORDER — FERROUS FUMARATE 325 (106 FE) MG PO TABS: 1.0000 | ORAL_TABLET | Freq: Every day | ORAL | Status: DC

## 2015-02-19 MED ORDER — FUROSEMIDE 40 MG PO TABS
60.0000 mg | ORAL_TABLET | Freq: Two times a day (BID) | ORAL | Status: DC
  Administered 2015-02-19: 60 mg via ORAL
  Filled 2015-02-19 (×3): qty 1

## 2015-02-19 MED ORDER — FERROUS FUMARATE 325 (106 FE) MG PO TABS
1.0000 | ORAL_TABLET | Freq: Every day | ORAL | Status: DC
  Administered 2015-02-19: 1 via ORAL
  Filled 2015-02-19 (×2): qty 1

## 2015-02-19 MED ORDER — IPRATROPIUM-ALBUTEROL 0.5-2.5 (3) MG/3ML IN SOLN
3.0000 mL | Freq: Once | RESPIRATORY_TRACT | Status: AC
  Administered 2015-02-19: 3 mL via RESPIRATORY_TRACT
  Filled 2015-02-19: qty 3

## 2015-02-19 MED ORDER — INSULIN GLARGINE 100 UNIT/ML ~~LOC~~ SOLN: 10.0000 [IU] | Freq: Every day | SUBCUTANEOUS | Status: DC

## 2015-02-19 MED ORDER — POLYETHYLENE GLYCOL 3350 17 G PO PACK: 17.0000 g | PACK | Freq: Every day | ORAL | Status: DC

## 2015-02-19 MED ORDER — APIXABAN 5 MG PO TABS: 5.0000 mg | ORAL_TABLET | Freq: Two times a day (BID) | ORAL | Status: DC

## 2015-02-19 NOTE — Patient Outreach (Signed)
[place] [facility]) Care Management  02/19/2015  PERIS SHAFIQ 1946-07-17 [id number]  Initial transition of care contact. RN reintroduced [facility] services as pt was recently discharged via [facility] last month under the HF program. RN spoke with pt today and inquired on her recent discharge from the hospital. Pt states she was in the hospital for bleeding and discharge today. Pt reports she understands her discharge information however pending her new medications with a plan pick up on Monday when one of her prescription's will be available. Pt denies any issues at this time and reports [facility] has contacted her for PT/OT and nursing services in the home [facility]). Pt reports she is interested in MCD application and reports she currently has assistance with her medications. Reports her income maybe over the MCD requirement but remains interested in a social work consult. RN verified pt has appointments scheduled to visit her doctors ([facility] 7/18, Dr. Olevia Perches (GI) in August and her primary Dr. Noah Delaine 6/27). RN offered home visist and inquired on any issues that needed to be addressed however pt feels all inquires were answered on today's call and receptive to ongoing transition of care contacts over the next few weeks.  Again pt receptive to a social work consult for MCD and aide services (referral completed).  Raina Mina, RN Care Management Coordinator [facility] Main Office [phone]

## 2015-02-19 NOTE — Telephone Encounter (Signed)
FYI

## 2015-02-19 NOTE — Discharge Summary (Signed)
Physician Discharge Summary   Patient ID: [name] MRN: [id number] DOB/AGE: 09-24-1945 69 y.o.  Admit date: 02/16/2015 Discharge date: 02/19/2015  Primary Care Physician:  [name], MD  Discharge Diagnoses:    . Acute on chronic diastolic congestive heart failure . Atrial fibrillation . Chronic kidney disease (CKD), stage IV (severe) . Sick sinus syndrome . Tachy-brady syndrome  Consults: Gastroenterology, Dr. Ardis Hughs Nephrology, Dr. 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 Maurene Capes in 6 weeks.  Patient was placed on iron supplementation, if blood counts fail to improve, will need further workup including EGD or perhaps enteroscopy or small bowel Endoscopy.    TESTS THAT NEED FOLLOW-UP CBC, BMET   DIET: Carb modified diet    Allergies:   Allergies  Allergen Reactions  . Adhesive [Tape] Itching    EKG leads  . Sulfa Antibiotics Itching and Nausea And Vomiting    "everything I seen was red"  . Codeine Nausea And Vomiting  . Penicillins Nausea And Vomiting     Discharge Medications:   Medication List    TAKE these medications        acetaminophen 500 MG tablet  Commonly known as:  TYLENOL  Take 500 mg by mouth every 6 (six) hours as needed for mild pain.     albuterol (2.5 MG/3ML) 0.083% nebulizer solution  Commonly known as:  PROVENTIL  Take 2.5 mg by nebulization every 6 (six) hours as needed for shortness of breath. Use four times a day as needed for shortness of breath or wheezing     amiodarone 200 MG tablet  Commonly known as:  PACERONE  Take 1 tablet (200 mg total) by mouth daily.     amitriptyline 25 MG tablet  Commonly known as:  ELAVIL  Take 25 mg by mouth at bedtime.     apixaban 5 MG Tabs tablet  Commonly  known as:  ELIQUIS  Take 1 tablet (5 mg total) by mouth 2 (two) times daily.  Start taking on:  02/21/2015     atorvastatin 40 MG tablet  Commonly known as:  LIPITOR  Take 1 tablet (40 mg total) by mouth daily.     dicyclomine 10 MG capsule  Commonly known as:  BENTYL  Take 10 mg by mouth daily.     diltiazem 180 MG 24 hr capsule  Commonly known as:  CARDIZEM CD  Take 1 capsule (180 mg total) by mouth daily.     esomeprazole 40 MG capsule  Commonly known as:  NEXIUM  Take 1 capsule (40 mg total) by mouth daily at 12 noon.     febuxostat 40 MG tablet  Commonly known as:  ULORIC  Take 40 mg by mouth daily.     ferrous fumarate 325 (106 FE) MG Tabs tablet  Commonly known as:  HEMOCYTE - 106 mg FE  Take 1 tablet (106 mg of iron total) by mouth daily with breakfast.     hydrALAZINE 25 MG tablet  Commonly known as:  APRESOLINE  Take 1 tablet (25 mg total) by mouth 3 (three) times daily.     HYDROcodone-acetaminophen 5-325 MG per tablet  Commonly known as:  NORCO/VICODIN  Take 1 tablet by mouth every 6 (six) hours as needed. Pain     insulin glargine 100 UNIT/ML injection  Commonly  known as:  LANTUS  Inject 0.1 mLs (10 Units total) into the skin at bedtime.     insulin lispro 100 UNIT/ML injection  Commonly known as:  HUMALOG  Inject 0.2 mLs (20 Units total) into the skin 3 (three) times daily after meals.     Linaclotide 145 MCG Caps capsule  Commonly known as:  LINZESS  Take 145 mcg by mouth daily.     metolazone 2.5 MG tablet  Commonly known as:  ZAROXOLYN  Take 1 tablet (2.5 mg total) by mouth once a week. MONDAYS     metoprolol tartrate 25 MG tablet  Commonly known as:  LOPRESSOR  TAKE THREE TABLETS BY MOUTH TWICE DAILY     nitroGLYCERIN 0.4 MG SL tablet  Commonly known as:  NITROSTAT  Place 0.4 mg under the tongue every 5 (five) minutes as needed for chest pain.     polyethylene glycol packet  Commonly known as:  MIRALAX / GLYCOLAX  Take 17 g by mouth  daily.     potassium chloride SA 20 MEQ tablet  Commonly known as:  K-DUR,KLOR-CON  Take 3 tabs in the AM and 2 tabs in the PM     ranitidine 150 MG tablet  Commonly known as:  ZANTAC  Take 1 tablet (150 mg total) by mouth at bedtime.     torsemide 20 MG tablet  Commonly known as:  DEMADEX  Take 2 tablets (40 mg total) by mouth 2 (two) times daily.     Vitamin D (Ergocalciferol) 50000 UNITS Caps capsule  Commonly known as:  DRISDOL  Take 50,000 Units by mouth every Monday.     XOPENEX HFA 45 MCG/ACT inhaler  Generic drug:  levalbuterol  Inhale 2 puffs into the lungs every 4 (four) hours as needed for wheezing or shortness of breath.     zolpidem 10 MG tablet  Commonly known as:  AMBIEN  Take 10 mg by mouth at bedtime.         Brief H and P: For complete details please refer to admission H and P, but in brief 69 year old female with hypertension, paroxysmal atrial fibrillation on apixiban, GERD, chronic diastolic CHF, diabetes mellitus presented with 1 month history of shortness of breath. The patient went to see her pulmonologist on admission. Routine blood work was obtained and showed hemoglobin of 7. As a result, the patient was sent to the emergency room for further evaluation. Patient had been complaining of worsening dyspnea on exertion for the past month. She has had intermittent chest discomfort at rest as well as with exertion. The patient is normally on home oxygen, 3 L nasal cannula 24 hours a day. She reported nonproductive cough but denies any hemoptysis. The patient has had subjective fevers and chills. She denied any vomiting, diarrhea, dysuria, hematuria. She did have some blood on her toilet paper when she wipes. Otherwise, she has not seen any blood in the commode. In the emergency room, the patient was afebrile hemodynamically stable without any tachycardia. Oxygen saturation was 100% on nasal cannula with 3 L. Rectal exam revealed brown stool that was minimally  positive. Chest x-ray showed some mild increase in interstitial markings.  Hospital Course:    Acute on chronic diastolic CHF Improved, patient was placed on IV Lasix during hospitalization. She is transitioned to oral torsemide and metolazone per her home regimen.  Not on ACE inhibitor/ARB due to renal insufficiency.  Continue beta blocker - 2-D echo in 7/15 had shown normal EF of 60-65%,  mild LVH   Symptomatic anemia - CT abdomen and pelvis negative, GI was consulted. 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 Olevia Perches 6-7 weeks. If blood counts failed to improve, may need repeat endoscopy or small bowel enteroscopy.  nephrology, Dr Joelyn Oms was also consulted and recommended outpatient follow-up with Dr. Florene Glen, iron supplementation daily.   Diabeteses mellitus type 2; uncontrolled - hemoglobin A1c 6.9, continue Lantus and lispro.  Atypical chest pain -Troponin negative, 2-D echo showed EF of 123456, grade 2 diastolic dysfunction   Chronic respiratory failure -Presently stable on 3 L nasal cannula, prior history of tobacco use and asthma - Albuterol nebs as needed  Paroxysmal Atrial fibrillation -Presently in sinus -hold Apixiban  -Continue metoprolol and diltiazem  GERD -Continue PPI, -Patient has history of [name]'s esophagus - GI consulted  CKD stage IV -Monitor closely with diuresis -Baseline creatinine 3.1-3.3, follow up outpatient with nephrology    Day of Discharge BP 110/48 mmHg  Pulse 56  Temp(Src) 97.9 F (36.6 C) (Oral)  Resp 18  Ht 5\' 4"  (1.626 m)  Wt 111.449 kg (245 lb 11.2 oz)  BMI 42.15 kg/m2  SpO2 100%  Physical Exam: General: Alert and awake  oriented x3 not in any acute distress. HEENT: anicteric sclera, pupils reactive to light and accommodation CVS: S1-S2 clear no murmur rubs or gallops Chest: clear to auscultation bilaterally, no wheezing rales or rhonchi Abdomen: soft nontender, nondistended, normal bowel sounds Extremities: no cyanosis, clubbing or edema noted bilaterally Neuro: Cranial nerves II-XII intact, no focal neurological deficits   The results of significant diagnostics from this hospitalization (including imaging, microbiology, ancillary and laboratory) are listed below for reference.    LAB RESULTS: Basic Metabolic Panel:  Recent Labs Lab 02/18/15 0416 02/19/15 0256  NA 143 141  K 3.7 3.9  CL 105 106  CO2 27 27  GLUCOSE 125* 193*  BUN 28* 28*  CREATININE 2.89* 2.98*  CALCIUM 9.5 9.0   Liver Function Tests:  Recent Labs Lab 02/16/15 1138  AST 14  ALT 12  ALKPHOS 67  BILITOT 0.4  PROT 7.5  ALBUMIN 4.2   No results for input(s): LIPASE, AMYLASE in the last 168 hours. No results for input(s): AMMONIA in the last 168 hours. CBC:  Recent Labs Lab 02/16/15 1138  02/18/15 0416 02/19/15 0256  WBC 8.9  < > 7.3 7.7  NEUTROABS 6.4  --   --   --   HGB 7.3*  < > 9.3* 8.3*  HCT 25.0*  < > 32.2* 28.9*  MCV 72.2*  < > 77.6* 79.2  PLT 234.0  < > 205 183  < > = values in this interval not displayed. Cardiac Enzymes: No results for input(s): CKTOTAL, CKMB, CKMBINDEX, TROPONINI in the last 168 hours. BNP: Invalid input(s): POCBNP CBG:  Recent Labs Lab 02/19/15 0639 02/19/15 1127  GLUCAP 135* 183*    Significant Diagnostic Studies:  Ct Abdomen Pelvis Wo Contrast  02/17/2015   CLINICAL DATA:  69 year old female with abdominal pain and blood loss. History of diverticulosis. Anemia.  EXAM: CT ABDOMEN AND PELVIS WITHOUT CONTRAST  TECHNIQUE: Multidetector CT imaging of the abdomen and pelvis was performed following the standard protocol without IV contrast.  COMPARISON:  Ultrasound  02/19/2009   FINDINGS: Lower chest: Mild ground-glass opacities at lung base. No nodularity. No pleural fluid.  Hepatobiliary: No focal hepatic lesions non contrast exam. Gallbladder normal.  Pancreas: Pancreas is normal. No ductal dilatation. No pancreatic inflammation.  Spleen: Normal spleen  Adrenals/urinary tract: Adrenal glands are mildly nodular. No nephrolithiasis or ureterolithiasis. Bladder is.  Stomach/Bowel: Stomach, small bowel, appendix, and cecum are normal. The colon and rectosigmoid colon are normal.  Vascular/Lymphatic: Abdominal aorta is normal caliber with atherosclerotic calcification. There is no retroperitoneal or periportal lymphadenopathy. No pelvic lymphadenopathy.  Reproductive: Uterus and ovaries are normal.  Musculoskeletal: No pelvic.  Other: No acute osseous abnormality.  IMPRESSION: 1. No abnormality of the bowel. No obstruction or inflammation. No explanation for anemia. 2. Atherosclerotic calcification the aorta. 3. Mild adrenal nodularity likely represents hyperplasia   Electronically Signed   By: Suzy Bouchard M.D.   On: 02/17/2015 14:23   Dg Chest 2 View  02/16/2015   CLINICAL DATA:  Shortness of breath  EXAM: CHEST  2 VIEW  COMPARISON:  01/18/2015  FINDINGS: Dual-chamber pacer leads from the left are in unremarkable position. Stable normal heart size and mild aortic tortuosity. Chronic interstitial coarsening. There is no edema, consolidation, effusion, or pneumothorax.  IMPRESSION: Stable appearance of the chest, including chronic bronchitic markings. No evidence of acute disease.   Electronically Signed   By: Monte Fantasia M.D.   On: 02/16/2015 16:37    2D ECHO: Study Conclusions  - Left ventricle: The cavity size was normal. There was mild concentric hypertrophy. Systolic function was normal. The estimated ejection fraction was in the range of 60% to 65%. Wall motion was normal; there were no regional wall motion abnormalities. Doppler parameters are consistent  with pseudonormal left ventricular relaxation (grade 2 diastolic dysfunction). The E/A ratio is >1.5 and the E/e&' ratio is >15, suggesting elevated LV filling pressure. - Aortic valve: Trileaflet. Sclerosis without stenosis. - Mitral valve: There was trivial regurgitation. - Right atrium: The atrium was normal in size. - Pulmonary arteries: PA peak pressure: 38 mm Hg (S). - Inferior vena cava: The vessel was dilated. The respirophasic diameter changes were blunted (< 50%), consistent with elevated central venous pressure.  Impressions:  - Compared to the prior study in 03/2014, there is now grade 2 diastolic dysfunction wtih elevated LV filling pressure, elevated RA pressure and mild pulmonary hypertension (RVSP 38 mmHg). LVEF remains 60-65%.  Disposition and Follow-up: Discharge Instructions    AMB Referral to [place] Management    Complete by:  As directed   Reason for consult:  HF exacerbation, hx with [facility] Care Management,  patient wants follow up  Diagnoses of:  Heart Failure  Expected date of contact:  1-3 days (reserved for hospital discharges)  Please assign to community nurse for transition of care calls and assess for home visits, assess for social worker needs for personal care services - long term.  Patient states she would like assistance in applying for Medicaid as she is having difficulty with her personal care, exhausting her energy.  Questions please call:  Natividad Brood, RN BSN [facility] Liaison  [phone] business mobile phone     Diet Carb Modified    Complete by:  As directed      Discharge instructions    Complete by:  As directed   Please HOLD eliquis for another 2 days     Increase activity slowly    Complete by:  As directed  DISPOSITION: home    DISCHARGE FOLLOW-UP Follow-up Information    Follow up with [name], MD On 03/01/2015.   Specialty:  Internal Medicine   Why:  for hospital  follow-up at 1:45pm with Stanton Kidney, NP   Contact information:   [place], [place] [address] [phone]       Follow up with Delfin Edis, MD On 04/06/2015.   Specialty:  Gastroenterology   Why:  for hospital follow-up at 11:00am   Contact information:   [address] [place] [address] [phone]       Follow up with Estanislado Emms, MD On 03/22/2015.   Specialty:  Nephrology   Why:  for hospital follow-up, obtain labs at 4:00pm    Contact information:   [place] [address] [phone]        Time spent on Discharge: 35 mins   Signed:   Victoria Henshaw M.D. [facility] 02/19/2015, 12:47 PM Pager: [phone]

## 2015-02-19 NOTE — Progress Notes (Signed)
Pt has orders to be discharged. Discharge instructions given and pt has no additional questions at this time. Medication regimen reviewed and pt educated. Pt verbalized understanding and has no additional questions. IV removed and site in good condition. Pt stable and waiting for transportation.   Charnee Turnipseed RN 

## 2015-02-22 NOTE — Patient Outreach (Signed)
[facility]) Care Management  02/22/2015  [name] 1946/03/24 [id number]   Notification from Raina Mina, RN to assign SW, assigned [employer], LCSW ([place], [place] as well)  [name]. [place], [place] Management [place] Assistant Phone: [phone] Fax: [phone]

## 2015-02-23 ENCOUNTER — Other Ambulatory Visit: Payer: Self-pay | Admitting: *Deleted

## 2015-02-24 ENCOUNTER — Other Ambulatory Visit (HOSPITAL_COMMUNITY): Payer: Self-pay | Admitting: Cardiology

## 2015-02-24 ENCOUNTER — Telehealth: Payer: Self-pay | Admitting: Internal Medicine

## 2015-02-24 NOTE — Patient Outreach (Signed)
[facility]) Care Management  02/24/2015  [name] 05/21/1946 [id number]   Second week of ongoing transition of care contact.  RN spoke with pt who indicates she continues to do well with involved [name]). Reports they have been addressing her HF with weight management although her last admission was related to an internal bleed. Pt states she has been managing her diabetes and has been taking all her medications as recommended.  Denies any issues at this time as she continues to recover with no delays.  No additional symptoms mentioned with bleeding and no SOB or issues with swelling related to her HF history. Pt reports she continues to do well. RN continued to offer the availibilty of home visit if she need further assistance in managing her ongoing medical issues. Pt receptive to the [facility] services at this time and does not really have any other issues that [facility] services needs to address at this time however receptive to the ongoing telephonic transition of care contact. Will continue to follow up weekly. Pt aware RN coverage for call next week and this RN will follow up on the week of the 4th. No other inquires or request at this time.  Raina Mina, RN Care Management Coordinator [facility] Main Office [phone]

## 2015-02-24 NOTE — Telephone Encounter (Signed)
Spoke with Lorriane Shire a Electrical engineer with [name]. She wants MD to discuss possible ENT referral at [place] on August 2nd. She states patient has ringing in ears and voice changes. Patient does not want to move up OV.

## 2015-02-26 ENCOUNTER — Encounter (HOSPITAL_COMMUNITY): Payer: Commercial Managed Care - HMO

## 2015-02-26 NOTE — Telephone Encounter (Signed)
Noted  

## 2015-03-01 ENCOUNTER — Other Ambulatory Visit: Payer: Self-pay

## 2015-03-04 ENCOUNTER — Telehealth (HOSPITAL_COMMUNITY): Payer: Self-pay | Admitting: *Deleted

## 2015-03-04 ENCOUNTER — Other Ambulatory Visit: Payer: Self-pay

## 2015-03-04 NOTE — Telephone Encounter (Signed)
Received call from anne with [employer]. She states pt's wt is up to 246 lb, she was 236 lb on 6/21 and pt reported SOB with activity.  Called and spoke w/pt, she states she realized this week that her daughter, who fixes her pill box, hadn't put her PM dose of Torsemide in her box so she has just started back on that, and she also reports she is not taking Metolazone on Mondays but she did take it today since her weight was up.  Advised pt to take Torsemide 40 mg Twice daily and to take metolazone every Mon, also discussed importance of keeping her appt on 7/15

## 2015-03-04 NOTE — Patient Outreach (Signed)
Transition of care call week 3:  (assigned RN case Manager on PAL) Filed Vitals:   03/04/15 1308  Weight: 246 lb (111.585 kg)   Called and spoke with patient who reports that she is doing well. States that she has had a 4 pound weight gain over night and has been directed to take an extra Lasix.  Yesterday weight of 242. Today 246. Patient reports that she has slight swelling and tightness to her feet.  Reports no increase in shortness of breath.  Patient denies any active bleeding. Patient reports that she remains active with Home Health PT and OT.  Objective: Patient talking in complete sentences.  Plan:  Patient has follow up appointment with pulmonary in 1 week. Follow up in Heart Failure Clinic in 2 weeks and Follow up appointment with renal in 3 weeks.  Patient reports that daughter drives her to her appointments.  Patient denies any new problems or concerns, I reviewed heart failures zones, low salt diet, and when to call MD. Patient has agreed to call heart failure clinic tomorrow if weight has not gone down.  Reviewed with patient how to contact the call a nurse line or myself if needed. Patient verbalized understanding of plan.  [facility] CM Care Plan Problem One        Patient Outreach Telephone from 03/04/2015 in [place] Problem One  Recent hospital admission related to GI bleed.   Care Plan for Problem One  Active   [facility] Long Term Goal (31-90 days)  Patient will be able to report no readmissions in the next 31 days   [facility]N Long Term Goal Start Date  03/04/15   Interventions for Problem One Long Term Goal  Reviewed with that patient the importance of timely follow up with primary MD and specialist. Reviewed the 24 hour nurse line.   THN CM Short Term Goal #1 (0-30 days)  Patient will be able to verbalize following her low salt diet in the next 30 days.   THN CM Short Term Goal #1 Start Date  03/04/15   Interventions for Short Term Goal #1  Reviewed with patient  the importance of low salt diet. Discussed foods to avoid. for example pork, spaghetti sauce. chips   THN CM Short Term Goal #2 (0-30 days)  Patient will be able to  verbalize recording daily weights in the next 30 days.   THN CM Short Term Goal #2 Start Date  03/04/15   Interventions for Short Term Goal #2  Reviewed heart failure zones. Reviewed with patient the importance of calling  MD for weight gain greater than 3 pounds over night or 5 pounds in a week.    [facility] CM Care Plan Problem Two        Patient Outreach from 01/22/2015 in [place] Problem Two  Lack of knoledge (HF)   Care Plan for Problem Two  Not Active   THN CM Short Term Goal #1 (0-30 days)  Pt will increase her knoweldge base with HF zones   within the next 30 days.   THN CM Short Term Goal #1 Start Date  12/02/14   [facility] CM Short Term Goal #1 Met Date   12/25/14   Interventions for Short Term Goal #2   Discussed HF zones and review all area and strongly encouraged pt to document what her daily zone is in her [facility] calendar tool.   THN CM Short Term Goal #2 (  0-30 days)  Pt will completed daily weights and docuemnt all readings in her calendar within the next 30 days.   THN CM Short Term Goal #2 Start Date  12/25/14   [facility] CM Short Term Goal #2 Met Date  01/22/15   Interventions for Short Term Goal #2  Discussed the importance of daily weights and document all readings for providers to view.      Next planned follow up via phone for transition of care call in 7 days by primary case manager Raina Mina.  Tomasa Rand, RN, BSN, CEN [facility] [employer] [phone]

## 2015-03-05 ENCOUNTER — Other Ambulatory Visit (HOSPITAL_COMMUNITY): Payer: Self-pay | Admitting: Internal Medicine

## 2015-03-05 ENCOUNTER — Other Ambulatory Visit: Payer: Self-pay

## 2015-03-05 NOTE — Patient Outreach (Signed)
Received call from [place] [phone]. She reports that patient has actually had a 10 pound weight gain. Lelon Frohlich reports that she has called the heart failure clinic and reported weights.  She is awaiting a response from MD.  Ardis Hughs for her intervention: [facility] will continue to follow patient for Transition of care:  Tomasa Rand, RN, BSN, CEN [place] Coordinator [phone]

## 2015-03-09 ENCOUNTER — Other Ambulatory Visit: Payer: Self-pay | Admitting: *Deleted

## 2015-03-09 NOTE — Patient Outreach (Signed)
[facility]) Care Management  03/09/2015  [name] Mar 31, 1946 [id number]  4th Transition of care contact.   RN spoke with pt today who indicates she continues to do well with [name]. States OT will be finish this week and PT/RN will continue. Pt also verifies [name]) continues to follow up telephonically concerning her ongoing progress. RN verified pt continues to manage her HF with documented daily weights reporting a weight of 241 lbs over the last two day (Sunday and Monday) with 242 lbs this AM. Pt reports she remains in the GREEN zone with no distress and breathing without difficulty. Denies any swelling and prior call via [facility] coverage nurse on 6/30 with increased fluid issues has resolved with no additional problems. Pt states she is able to review past printed information this RN has provided concerning HF that has been very helpful. Pt feels she is doing well in managing her HF with the current services with [name]. Reports she has all her medications filled and taking as prescribed with no delays. Pt reports she was not able to attend her primary doctor's appointment due to the co-pays however will contact Dr. Noah Delaine and inform of her recent hospitalization for possible future appointment if needed. However pt has verified upcoming appointments with other providers: Pulmonary 7/8, HF Clinic 7/15 and Kidney 7/18.   After verification of pt's knowledge base concerning managing her HF pt has declined RN's office for home visits but receptive to ongoing telephonic case management via [name] with [name]. Pt fully understanding if she feels she needs community home visits from this RN to inform the assigned [name] RN accordingly for referral back to this RN for such arrangements. Pt grateful for the calls and information provided over the past weeks as she continues with [facility] services. No additional inquires or request at this time as this case will  be referral accordingly for a Health Coach with [name] services.   Raina Mina, RN Care Management Coordinator [place] [facility] Main Office [phone]

## 2015-03-11 ENCOUNTER — Other Ambulatory Visit: Payer: Self-pay | Admitting: *Deleted

## 2015-03-11 ENCOUNTER — Encounter: Payer: Self-pay | Admitting: Pulmonary Disease

## 2015-03-11 NOTE — Patient Outreach (Signed)
[facility]) Care Management  03/11/2015  SANDAR RODENBECK 1946-06-14 [id number]  Phone call to patient to follow up on financial needs.  Home visit scheduled to assist with Medicaid application [id number] at [place], [place] Management [phone]

## 2015-03-12 ENCOUNTER — Encounter: Payer: Self-pay | Admitting: *Deleted

## 2015-03-12 ENCOUNTER — Encounter: Payer: Self-pay | Admitting: Adult Health

## 2015-03-12 ENCOUNTER — Ambulatory Visit (INDEPENDENT_AMBULATORY_CARE_PROVIDER_SITE_OTHER): Payer: Commercial Managed Care - HMO | Admitting: Adult Health

## 2015-03-12 VITALS — BP 108/70 | HR 60 | Temp 98.4°F | Ht 64.0 in | Wt 239.0 lb

## 2015-03-12 DIAGNOSIS — I5042 Chronic combined systolic (congestive) and diastolic (congestive) heart failure: Secondary | ICD-10-CM

## 2015-03-12 DIAGNOSIS — R0600 Dyspnea, unspecified: Secondary | ICD-10-CM | POA: Diagnosis not present

## 2015-03-12 DIAGNOSIS — G4733 Obstructive sleep apnea (adult) (pediatric): Secondary | ICD-10-CM

## 2015-03-12 NOTE — Patient Instructions (Signed)
CPAP download requested.  Set up for CT chest as previously recommended prior to next visit. With Dr. Halford Chessman   Continue on current regimen  follow up Dr. Halford Chessman  In 6-8 weeks and As needed   Please contact office for sooner follow up if symptoms do not improve or worsen or seek emergency care

## 2015-03-12 NOTE — Progress Notes (Signed)
   Subjective:    Patient ID: [name], female    DOB: 01-Jan-1946, 69 y.o.   MRN: [id number]  HPI [name] is a 69 y.o. female former smoker with aspiration PNA .  Sleep consult 02/16/15   Tests: Echo 03/06/14 >> mild LVH, EF 60 to 65% PFT 02/04/15 >> FEV1 1.88 (100%), FEV1% 83, TLC 3.79 (75%), DLCO 28%, +BD   03/12/2015 Follow up OSA and Dyspnea  Returns for follow up .  Continues to have DOE and dry cough . Might feel some better since last ov.  Labs done last ov showed very anemic and was referred to ER for eval.  She was admitted. , received transfusion . Started on iron therapy.  Colonoscopy showed AVM. That required ablation.  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  HRCT chest was recommended but has not set up yet.  Did not have CPAP download. Says wears CPAP each night.  On Oxygen 3l/m  Has chronic kidney dz , with OP renal follow up.  Denies chest pain, increased edema or fever.    Review of Systems Constitutional:   No  weight loss, night sweats,  Fevers, chills,  +fatigue, or  lassitude.  HEENT:   No headaches,  Difficulty swallowing,  Tooth/dental problems, or  Sore throat,                No sneezing, itching, ear ache, nasal congestion, post nasal drip,   CV:  No chest pain,  Orthopnea, PND, swelling in lower extremities, anasarca, dizziness, palpitations, syncope.   GI  No heartburn, indigestion, abdominal pain, nausea, vomiting, diarrhea, change in bowel habits, loss of appetite, bloody stools.   Resp:    No chest wall deformity  Skin: no rash or lesions.  GU: no dysuria, change in color of urine, no urgency or frequency.  No flank pain, no hematuria   MS:  No joint pain or swelling.  No decreased range of motion.  No back pain.  Psych:  No change in mood or affect. No depression or anxiety.  No memory loss.         Objective:   Physical Exam GEN: A/Ox3; pleasant , NAD, elderly , obese   HEENT:  [place]/AT,   EACs-clear, TMs-wnl, NOSE-clear, THROAT-clear, no lesions, no postnasal drip or exudate noted.   NECK:  Supple w/ fair ROM; no JVD; normal carotid impulses w/o bruits; no thyromegaly or nodules palpated; no lymphadenopathy.  RESP  Decreased BS in bases .no accessory muscle use, no dullness to percussion  CARD:  RRR, no m/r/g  , tr  peripheral edema, pulses intact, no cyanosis or clubbing.  GI:   Soft & nt; nml bowel sounds; no organomegaly or masses detected.  Musco: Warm bil, no deformities or joint swelling noted.   Neuro: alert, no focal deficits noted.    Skin: Warm, no lesions or rashes        Assessment & Plan:

## 2015-03-16 ENCOUNTER — Other Ambulatory Visit: Payer: Self-pay | Admitting: *Deleted

## 2015-03-16 NOTE — Patient Outreach (Addendum)
  [facility]) Care Management  [facility] Social Work  03/16/2015  [name] 1945/09/26 [id number]   Functional Status:  In your present state of health, do you have any difficulty performing the following activities: 02/16/2015 01/22/2015  Hearing? N N  Vision? N N  Difficulty concentrating or making decisions? N N  Walking or climbing stairs? Y N  Dressing or bathing? Y N  Doing errands, shopping? Y Y  Conservation officer, nature and eating ? - N  Using the Toilet? - N  In the past six months, have you accidently leaked urine? - N  Do you have problems with loss of bowel control? - N  Managing your Medications? - N  Managing your Finances? - N  Housekeeping or managing your Housekeeping? - N    Fall/Depression Screening:  PHQ 2/9 Scores 01/22/2015 12/25/2014 12/02/2014  PHQ - 2 Score 0 0 0    Assessment: Home visit to patient's home.  Patient requesting information on full Medicaid benefits to assist with co-pays and personcal care services.  Patient also requesting  assistance with interpreting her rent increase.  Patient currently receives Meals on Wheels and is on Section 8.  The  owner of her home requested an increase through the [employer] and increase want granted.  As a result, patient's rent  increased from $196.00 to $ 273.00 per month.  This CSW contacted [employer](management company for patient's home)  [phone]  who confirmed this information and stated  that patient signed a form in May 2016 agreeing to the increase.  Per patient, she did not think that the rent pertained to her when she signed.   Patient has contacted has contacted her Section 8 case worker Ms. Lois Huxley, however per Ms. Lois Huxley there is nothing that can be done. Patient informed that this social worker had no ability to change decision made regarding her rent increase and that she would have to make adjustments in her budget to absorb the increase.    Patient currently has Medicaid Part B and  does not meet  income requirements for full Medicaid benefits.     Phone call to the Department of Social Services to discuss likelihood of patient qualifying for full Medicaid benefits.  It was recommended that patient come in to the office to apply for Medicaid face to face. Application could not be mailed in.  List of items needed for Medicaid application provided to patient to prepare for face to face application.  Plan: Per patient , her  daughter will to take her on Friday to apply for Medicaid in person. Items needed for Medicaid application provided to patient  This social worker referred patient to the [facility] for personal care services on a sliding scale.  Patient informed that she would be placed on a waiting list. No further social work intervention needed.  Patient's case to be closed to social work.    Sheralyn Boatman [facility] Care Management [phone]

## 2015-03-18 DIAGNOSIS — R0600 Dyspnea, unspecified: Secondary | ICD-10-CM | POA: Insufficient documentation

## 2015-03-18 NOTE — Assessment & Plan Note (Signed)
Dyspnea with chronic hypoxic resp failure with multiple comorbities Autoimmune neg except for RA factor  Will need HRCT chest for further eval May need rheumatology referral  follow up with Dr. Halford Chessman  In 4-6 weeks

## 2015-03-18 NOTE — Assessment & Plan Note (Signed)
Continue on nocturnal CPAP  CPAP download requested.

## 2015-03-19 ENCOUNTER — Ambulatory Visit (HOSPITAL_COMMUNITY)
Admission: RE | Admit: 2015-03-19 | Discharge: 2015-03-19 | Disposition: A | Discharge status: Home / Self Care | Payer: Commercial Managed Care - HMO | Source: Ambulatory Visit | Attending: Cardiology | Admitting: Cardiology

## 2015-03-19 ENCOUNTER — Telehealth: Payer: Self-pay | Admitting: Pulmonary Disease

## 2015-03-19 VITALS — BP 142/72 | HR 60 | Wt 225.0 lb

## 2015-03-19 DIAGNOSIS — I129 Hypertensive chronic kidney disease with stage 1 through stage 4 chronic kidney disease, or unspecified chronic kidney disease: Secondary | ICD-10-CM | POA: Diagnosis not present

## 2015-03-19 DIAGNOSIS — Z79899 Other long term (current) drug therapy: Secondary | ICD-10-CM | POA: Insufficient documentation

## 2015-03-19 DIAGNOSIS — K219 Gastro-esophageal reflux disease without esophagitis: Secondary | ICD-10-CM | POA: Insufficient documentation

## 2015-03-19 DIAGNOSIS — Z87891 Personal history of nicotine dependence: Secondary | ICD-10-CM | POA: Insufficient documentation

## 2015-03-19 DIAGNOSIS — G4733 Obstructive sleep apnea (adult) (pediatric): Secondary | ICD-10-CM | POA: Insufficient documentation

## 2015-03-19 DIAGNOSIS — I4892 Unspecified atrial flutter: Secondary | ICD-10-CM | POA: Diagnosis not present

## 2015-03-19 DIAGNOSIS — I4891 Unspecified atrial fibrillation: Secondary | ICD-10-CM | POA: Diagnosis not present

## 2015-03-19 DIAGNOSIS — I739 Peripheral vascular disease, unspecified: Secondary | ICD-10-CM | POA: Insufficient documentation

## 2015-03-19 DIAGNOSIS — I252 Old myocardial infarction: Secondary | ICD-10-CM | POA: Insufficient documentation

## 2015-03-19 DIAGNOSIS — E1122 Type 2 diabetes mellitus with diabetic chronic kidney disease: Secondary | ICD-10-CM | POA: Insufficient documentation

## 2015-03-19 DIAGNOSIS — Z794 Long term (current) use of insulin: Secondary | ICD-10-CM | POA: Diagnosis not present

## 2015-03-19 DIAGNOSIS — Z950 Presence of cardiac pacemaker: Secondary | ICD-10-CM | POA: Diagnosis not present

## 2015-03-19 DIAGNOSIS — J45909 Unspecified asthma, uncomplicated: Secondary | ICD-10-CM | POA: Diagnosis not present

## 2015-03-19 DIAGNOSIS — Z8249 Family history of ischemic heart disease and other diseases of the circulatory system: Secondary | ICD-10-CM | POA: Insufficient documentation

## 2015-03-19 DIAGNOSIS — I495 Sick sinus syndrome: Secondary | ICD-10-CM | POA: Diagnosis not present

## 2015-03-19 DIAGNOSIS — I5032 Chronic diastolic (congestive) heart failure: Secondary | ICD-10-CM | POA: Diagnosis not present

## 2015-03-19 DIAGNOSIS — N184 Chronic kidney disease, stage 4 (severe): Secondary | ICD-10-CM | POA: Diagnosis not present

## 2015-03-19 DIAGNOSIS — I480 Paroxysmal atrial fibrillation: Secondary | ICD-10-CM

## 2015-03-19 DIAGNOSIS — E785 Hyperlipidemia, unspecified: Secondary | ICD-10-CM | POA: Insufficient documentation

## 2015-03-19 LAB — COMPREHENSIVE METABOLIC PANEL
ALT: 23 U/L (ref 14–54)
AST: 33 U/L (ref 15–41)
Albumin: 3.6 g/dL (ref 3.5–5.0)
Alkaline Phosphatase: 70 U/L (ref 38–126)
Anion gap: 15 (ref 5–15)
BUN: 48 mg/dL — ABNORMAL HIGH (ref 6–20)
CO2: 26 mmol/L (ref 22–32)
Calcium: 10 mg/dL (ref 8.9–10.3)
Chloride: 98 mmol/L — ABNORMAL LOW (ref 101–111)
Creatinine, Ser: 3.31 mg/dL — ABNORMAL HIGH (ref 0.44–1.00)
GFR calc Af Amer: 15 mL/min — ABNORMAL LOW (ref >60)
GFR calc non Af Amer: 13 mL/min — ABNORMAL LOW (ref >60)
Glucose, Bld: 367 mg/dL — ABNORMAL HIGH (ref 65–99)
Potassium: 3.4 mmol/L — ABNORMAL LOW (ref 3.5–5.1)
Sodium: 139 mmol/L (ref 135–145)
Total Bilirubin: 0.5 mg/dL (ref 0.3–1.2)
Total Protein: 7.5 g/dL (ref 6.5–8.1)

## 2015-03-19 LAB — TSH: TSH: 2.592 u[IU]/mL (ref 0.350–4.500)

## 2015-03-19 MED ORDER — AMIODARONE HCL 100 MG PO TABS: 100.0000 mg | ORAL_TABLET | Freq: Every day | ORAL | Status: DC

## 2015-03-19 NOTE — Telephone Encounter (Signed)
Called pt to schedule appt to see TP on 7/25 but pt requested appt on 04/09/15 due to transportation issues. Nothing further needed.

## 2015-03-19 NOTE — Telephone Encounter (Signed)
Dr. Halford Chessman, you do not have an available appointments. Do you want to overbook or have patient come in and see TP? thanks

## 2015-03-19 NOTE — Telephone Encounter (Addendum)
Spoke with Lelon Frohlich from [facility].  She advised me that patient is pulling off her CPAP machine in the middle of the night.  A download has been requested for CPAP to check pressure settings.  Ann wanted to schedule appointment with Dr. Halford Chessman, but patient has not been scheduled for her CT yet and last OV notes state that patient needs to have CT done prior to next OV with Dr. Halford Chessman.  Delton See that we would have to set up the appointment for the CT before I can schedule follow up.  Advised her that I would ask PCC's to contact her regarding CT.  To [facility]

## 2015-03-19 NOTE — Telephone Encounter (Signed)
Spoke with pt & she requested CT be scheduled on 03/26/15 so her daughter could take her.  Pt scheduled 03/26/15 arrival @9 :63 am.  Spoke with pt & she is aware of the appt, given address & phone number for [facility].  I advised pt I will make Triage Nurse aware of this appt so an OV can be scheduled for her with Dr. Halford Chessman.

## 2015-03-19 NOTE — Progress Notes (Signed)
Patient ID: [name], female   DOB: 1946-07-04, 69 y.o.   MRN: [id number] PCP: Dr. Noah Delaine  Nephrologist: Dr. Florene Glen EP: Dr Rayann Heman   HPI: Ms. [name] is a 69 year old woman with hypertension, hyperlipidemia, bronchial asthma, diabetes mellitus, CKD stage IV (baseline cr ~2.5), atrial fibrillation and diastolic HF.  She was admitted in 4/15 for R TKR. That hospitalization complicated by a/c diastolic HF with respiratory distress requiring non-rebreather support. Weight on discharge was 226 pounds. ABG at that time 7.4/34/91/97%. Cr peaked at 2.9  Admitted 6/30-7/10/15 for SOB and CP following scheduled cardioversion. Found to have severe aspiration PNA and was intubated. She maintained SR for short period of time and then went back into Afib/Aflutter.  HR controlled on amio and diltiazem. Discharged to [facility] at a weight of 232 lbs.   Admitted 06/16/14 with symptomatic tachy/brady syndrome. 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 Discharge weight was 223 pounds.   Admitted 02/16/15 for symptomatic anemia s/p blood work from her pulmonologist.  Was diuresed with lasix while in hospital.   PFTs (6/16) with FVC 80%, FEV1 83%, ratio 103%, TLC 75%, DLCO 78% => restriction c/w interstitial process.   She returns for HF follow up. Since last visit has held metolazone. She had been taking twice a week undirected. She is taking torsemide 40 mg bid.  Weight is down 10 lbs.  Says breathing is fine, but she stays indoors most of the time.  Can do most of her ADLs without DOE.  Not doing chores or anything around the house. Sleeps on 3 pillows.  Continues to have cough with occasional dizziness.  Has been eating a lot of cucumbers, limits her fluid, denies salt intake.  Labs: 03/13/14: K+ 3.4, creatinine 2.10, BUN 16 04/02/14: K 5.6, creatinine 2.66, BUN 29 04/13/14: K 4.0, creatinine 2.66, K 5.6 06/20/14 K 3.7 creatinine 2.35  07/21/14 K 3.6 creatinine 2.55 2/16 K 3.8, creatinine 3.03, LFTs normal, TSH normal,  hgb 12.8  5/16 K 3.5, creatinine 3.37, LFTs normal, TSH mildly elevated, T3 just below normal, T4 normal. BNP 147 6/16 K 3.9, creatinine 2.98, BNP 364.2, SCL-70 negative, RF elevated but CCP negative, ANA negative  ROS: All systems negative except as listed in HPI, PMH and Problem List.  SH:  History   Social History  . Marital Status: Widowed    Spouse Name: N/A  . Number of Children: 3  . Years of Education: N/A   Occupational History  . Disabled    Social History Main Topics  . Smoking status: Former Smoker -- 0.50 packs/day for 35 years    Types: Cigarettes    Quit date: 02/16/1995  . Smokeless tobacco: Never Used     Comment: 05/2014  QUIT OVER 20 YEARS AGO "  . Alcohol Use: No  . Drug Use: No  . Sexual Activity: Not Currently   Other Topics Concern  . Not on file   Social History Narrative    FH:  Family History  Problem Relation Age of Onset  . Heart disease Mother   . Cancer Mother     bladder  . Kidney disease Mother   . Ovarian cancer Daughter   . Stomach cancer Maternal Uncle   . Colon cancer Maternal Aunt   . Esophageal cancer Neg Hx     Past Medical History  Diagnosis Date  . Atrial flutter     ablated by Dr Lovena Le in 2008  . Hypertension   . 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   . Diastolic heart failure 0000000    grade 2 diastolic dysfunction per Q000111Q echo  . Asthma   . Hyperlipidemia   . Fatty liver 2008    noted on ultrasound 2008  . Esophageal dysmotility 2008    noted on esophagram.  hx dysphagia.   . Arthritis   . Sleep apnea     wears CPAP  . Fatty tumor fatty tumor back  . Coronary atherosclerosis of native coronary artery   . Morbid obesity   . Myocardial infarction 2009  . Heart murmur   . Peripheral vascular disease   . GERD (gastroesophageal reflux disease) 2012    Barrets esophagus on bx 2012 and 2014.   . Anginal pain     occ; non-ischemic Lexiscan 09/2012  . CKD (chronic kidney disease) 04/2007     CKD stage 4(Dr. Erling Cruz)  . Complication of anesthesia     " DIFFICULTY BREATHING "  . Persistent atrial fibrillation     chads2 vasc score of at least 5  . Sick sinus syndrome   . Gouty arthropathy     Current Outpatient Prescriptions  Medication Sig Dispense Refill  . albuterol (PROVENTIL) (2.5 MG/3ML) 0.083% nebulizer solution Take 2.5 mg by nebulization every 6 (six) hours as needed for shortness of breath. Use four times a day as needed for shortness of breath or wheezing    . amiodarone (PACERONE) 100 MG tablet Take 1 tablet (100 mg total) by mouth daily. 30 tablet 6  . amitriptyline (ELAVIL) 25 MG tablet Take 25 mg by mouth at bedtime.      Marland Kitchen apixaban (ELIQUIS) 5 MG TABS tablet Take 1 tablet (5 mg total) by mouth 2 (two) times daily. 60 tablet 6  . atorvastatin (LIPITOR) 40 MG tablet Take 1 tablet (40 mg total) by mouth daily. 90 tablet 3  . dicyclomine (BENTYL) 10 MG capsule Take 10 mg by mouth daily.     Marland Kitchen diltiazem (CARDIZEM CD) 180 MG 24 hr capsule Take 1 capsule (180 mg total) by mouth daily. 30 capsule 3  . esomeprazole (NEXIUM) 40 MG capsule Take 1 capsule (40 mg total) by mouth daily at 12 noon. 30 capsule 0  . febuxostat (ULORIC) 40 MG tablet Take 40 mg by mouth daily.     . ferrous fumarate (HEMOCYTE - 106 MG FE) 325 (106 FE) MG TABS tablet Take 1 tablet (106 mg of iron total) by mouth daily with breakfast. 30 each 4  . hydrALAZINE (APRESOLINE) 25 MG tablet Take 1 tablet (25 mg total) by mouth 3 (three) times daily. 90 tablet 6  . insulin glargine (LANTUS) 100 UNIT/ML injection Inject 0.1 mLs (10 Units total) into the skin at bedtime. 10 mL 2  . insulin lispro (HUMALOG) 100 UNIT/ML injection Inject 0.2 mLs (20 Units total) into the skin 3 (three) times daily after meals. (Patient taking differently: Inject 50 Units into the skin every morning. ) 10 mL 11  . Linaclotide (LINZESS) 145 MCG CAPS capsule Take 145 mcg by mouth daily.     . metoprolol tartrate (LOPRESSOR)  25 MG tablet TAKE THREE TABLETS BY MOUTH TWICE DAILY 180 tablet 0  . nitroGLYCERIN (NITROSTAT) 0.4 MG SL tablet Place 0.4 mg under the tongue every 5 (five) minutes as needed for chest pain.     . polyethylene glycol (MIRALAX / GLYCOLAX) packet Take 17 g by mouth daily. 30 each 0  . potassium chloride SA (K-DUR,KLOR-CON) 20 MEQ tablet Take 3 tabs  in the AM and 2 tabs in the PM 150 tablet 2  . ranitidine (ZANTAC) 150 MG tablet TAKE ONE TABLET BY MOUTH AT BEDTIME 30 tablet 0  . torsemide (DEMADEX) 20 MG tablet Take 2 tablets (40 mg total) by mouth 2 (two) times daily. 120 tablet 2  . Vitamin D, Ergocalciferol, (DRISDOL) 50000 UNITS CAPS capsule Take 50,000 Units by mouth every Monday.    Penne Lash HFA 45 MCG/ACT inhaler Inhale 2 puffs into the lungs every 4 (four) hours as needed for wheezing or shortness of breath. 1 Inhaler 1  . zolpidem (AMBIEN) 10 MG tablet Take 10 mg by mouth at bedtime.      No current facility-administered medications for this encounter.     Filed Vitals:   03/19/15 1144  BP: 142/72  Pulse: 60  Weight: 225 lb (102.059 kg)  SpO2: 93%    PHYSICAL EXAM: General: Obese, NAD, Ambulated in the clinic with a cane. Daughter present HEENT: normal  Neck: supple. JVP 7-8 cm; Carotids 2+ bilat; no bruits. No lymphadenopathy or thryomegaly appreciate Cor: PMI nonpalpbale. Distant. Regular rate. No obvious murmur  Lungs:  Lungs clear Abdomen: Obese soft, nontender. Non-distended. No hepatosplenomegaly. No bruits or masses. Good bowel sounds.  Extremities: no cyanosis, clubbing, rash, warm. No edema.  Neuro: alert & orientedx3, cranial nerves grossly intact. moves all 4 extremities w/o difficulty. Affect pleasant   ASSESSMENT & PLAN:  1) Chronic diastolic HF: EF 123456 (0000000 echo). NYHA class IIIb - Volume status stable, down 10 lbs since last visit. - With relatively balanced fluid status, SOB appears multifactorial. - PFTs 02/04/15 >> FEV1 1.88 (100%), FEV1% 83, TLC  3.79 (75%), DLCO 28%, +BDv=> restriction consistent with an interstitial process.  - Check BMET/BNP today.   - No ACEI in setting of CKD - Continue torsemide 40 mg bid and KCl 60/40.   2) Afib/Aflutter:  Remains out of atrial fibrillation. She is on amiodarone, Cardizem, and metoprolol.  - No bleeding problems on Eliquis.   - Decrease amio to 100 mg daily with concerns for lung toxicity - Amiodarone use: Check LFTs and TSH today. Goes for yearly eye exams. - Continue Eliquis 5 mg BID 3) CKD stage IV:  4) Tachy/Brady syndrome: St Jude PPM.   5) OSA: She is having difficulty using CPAP.  I will refer her back to sleep medicine for an evaluation.    Satira Mccallum Tillery PA-C 03/19/2015   Patient seen with PA, agree with the above note.  Volume status is improved.  Continue torsemide 40 mg bid.  There is some concern for possible amiodarone lung toxicity. She has restrictive PFTs.  Plan high resolution CT chest per pulmonary. I will have her decrease amiodarone to 100 mg daily.  Check LFTs, TSH, and BMET today.   Followup in 1 month.   [name] 03/21/2015

## 2015-03-19 NOTE — Telephone Encounter (Signed)
Okay to schedule ROV with Tammy Parrett.

## 2015-03-19 NOTE — Patient Instructions (Signed)
Decrease Amiodarone to 100 mg daily, we have sent you a new prescription for 100 mg tablets  Labs today  Your physician recommends that you schedule a follow-up appointment in: 1 month

## 2015-03-22 NOTE — Patient Outreach (Signed)
[facility]) Care Management  03/22/2015  [name] [birth year] [id number]   Notification from [facility], LCSW to close case due to SW goals met.  [name]. [place], [place] Management [place] Assistant Phone: [phone] Fax: [phone]

## 2015-03-26 ENCOUNTER — Telehealth (HOSPITAL_COMMUNITY): Payer: Self-pay | Admitting: *Deleted

## 2015-03-26 ENCOUNTER — Other Ambulatory Visit (INDEPENDENT_AMBULATORY_CARE_PROVIDER_SITE_OTHER): Payer: Commercial Managed Care - HMO

## 2015-03-26 ENCOUNTER — Ambulatory Visit (INDEPENDENT_AMBULATORY_CARE_PROVIDER_SITE_OTHER)
Admission: RE | Admit: 2015-03-26 | Discharge: 2015-03-26 | Disposition: A | Discharge status: Home / Self Care | Payer: Commercial Managed Care - HMO | Source: Ambulatory Visit | Attending: Pulmonary Disease | Admitting: Pulmonary Disease

## 2015-03-26 ENCOUNTER — Other Ambulatory Visit: Payer: Self-pay | Admitting: *Deleted

## 2015-03-26 DIAGNOSIS — D509 Iron deficiency anemia, unspecified: Secondary | ICD-10-CM

## 2015-03-26 DIAGNOSIS — R002 Palpitations: Secondary | ICD-10-CM

## 2015-03-26 DIAGNOSIS — J849 Interstitial pulmonary disease, unspecified: Secondary | ICD-10-CM

## 2015-03-26 DIAGNOSIS — G4733 Obstructive sleep apnea (adult) (pediatric): Secondary | ICD-10-CM | POA: Diagnosis not present

## 2015-03-26 DIAGNOSIS — R0600 Dyspnea, unspecified: Secondary | ICD-10-CM | POA: Diagnosis not present

## 2015-03-26 DIAGNOSIS — Z9989 Dependence on other enabling machines and devices: Secondary | ICD-10-CM

## 2015-03-26 LAB — CBC WITH DIFFERENTIAL/PLATELET
Basophils Absolute: 0 10*3/uL (ref 0.0–0.1)
Basophils Relative: 0.4 % (ref 0.0–3.0)
Eosinophils Absolute: 0.7 10*3/uL (ref 0.0–0.7)
Eosinophils Relative: 9.6 % — ABNORMAL HIGH (ref 0.0–5.0)
HCT: 42.4 % (ref 36.0–46.0)
Hemoglobin: 13.4 g/dL (ref 12.0–15.0)
Lymphocytes Relative: 15.4 % (ref 12.0–46.0)
Lymphs Abs: 1.2 10*3/uL (ref 0.7–4.0)
MCHC: 31.5 g/dL (ref 30.0–36.0)
MCV: 82.5 fl (ref 78.0–100.0)
Monocytes Absolute: 1 10*3/uL (ref 0.1–1.0)
Monocytes Relative: 13.5 % — ABNORMAL HIGH (ref 3.0–12.0)
Neutro Abs: 4.6 10*3/uL (ref 1.4–7.7)
Neutrophils Relative %: 61.1 % (ref 43.0–77.0)
Platelets: 212 10*3/uL (ref 150.0–400.0)
RBC: 5.14 Mil/uL — ABNORMAL HIGH (ref 3.87–5.11)
RDW: 26.5 % — ABNORMAL HIGH (ref 11.5–15.5)
WBC: 7.5 10*3/uL (ref 4.0–10.5)

## 2015-03-26 NOTE — Telephone Encounter (Signed)
Pt called and stated her home health RN wanted her to give [place] a call and inform us that her heart has been fluttering. Pt said it isnt frequent and doesn't last long. This is just an FYI because her amiodarone was decreased at her last office visit.

## 2015-03-28 NOTE — Telephone Encounter (Signed)
Would have her wear 48 hour holter monitor.

## 2015-03-29 NOTE — Telephone Encounter (Signed)
Pt is agreeable to monitor, order placed will have Kamilah call and schedule

## 2015-03-31 ENCOUNTER — Other Ambulatory Visit: Payer: Self-pay | Admitting: *Deleted

## 2015-04-01 ENCOUNTER — Other Ambulatory Visit (HOSPITAL_COMMUNITY): Payer: Self-pay | Admitting: Cardiology

## 2015-04-01 ENCOUNTER — Other Ambulatory Visit: Payer: Self-pay

## 2015-04-01 ENCOUNTER — Telehealth: Payer: Self-pay

## 2015-04-01 NOTE — Telephone Encounter (Signed)
Per Dr. Aundra Dubin, patient should contact PCP for refill of Rantidine.

## 2015-04-02 ENCOUNTER — Ambulatory Visit (INDEPENDENT_AMBULATORY_CARE_PROVIDER_SITE_OTHER): Payer: Commercial Managed Care - HMO

## 2015-04-02 DIAGNOSIS — R002 Palpitations: Secondary | ICD-10-CM | POA: Diagnosis not present

## 2015-04-05 ENCOUNTER — Other Ambulatory Visit (HOSPITAL_COMMUNITY): Payer: Self-pay | Admitting: Internal Medicine

## 2015-04-05 ENCOUNTER — Ambulatory Visit (INDEPENDENT_AMBULATORY_CARE_PROVIDER_SITE_OTHER): Payer: Commercial Managed Care - HMO | Admitting: *Deleted

## 2015-04-05 DIAGNOSIS — I495 Sick sinus syndrome: Secondary | ICD-10-CM | POA: Diagnosis not present

## 2015-04-06 ENCOUNTER — Encounter: Payer: Self-pay | Admitting: Internal Medicine

## 2015-04-06 ENCOUNTER — Ambulatory Visit (INDEPENDENT_AMBULATORY_CARE_PROVIDER_SITE_OTHER): Payer: Commercial Managed Care - HMO | Admitting: Internal Medicine

## 2015-04-06 VITALS — BP 130/80 | HR 74 | Ht 64.0 in | Wt 247.2 lb

## 2015-04-06 DIAGNOSIS — K2270 Barrett's esophagus without dysplasia: Secondary | ICD-10-CM

## 2015-04-06 DIAGNOSIS — D509 Iron deficiency anemia, unspecified: Secondary | ICD-10-CM

## 2015-04-06 MED ORDER — RANITIDINE HCL 150 MG PO CAPS: 150.0000 mg | ORAL_CAPSULE | Freq: Every evening | ORAL | Status: DC

## 2015-04-06 NOTE — Progress Notes (Signed)
[name] 1945-09-23 [id number]  Note: This dictation was prepared with Dragon digital system. Any transcriptional errors that result from this procedure are unintentional.   History of Present Illness: This is a 69 year old African-American female hospitalized from 6/142016 to  02/19/2015 with acute and chronic congestive heart failure and presenting with hemoglobin of 7.0. She is on home oxygen. She has atrial fibrillation. CK D stage IV. She has been on Eliquis. She denies any rectal bleeding or tarry stools. There is a history of [name]'s esophagus on upper endoscopy in 2012 and again in December 2014. She is on a Nexium 40 mg at bedtime and ranitidine 150 mg in the morning. 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 The constipation for which she takes Linzess145 g daily. Her last hemoglobin 2 weeks ago was up to 13.4    Past Medical History  Diagnosis Date  . Atrial flutter     ablated by Dr Lovena Le in 2008  . Hypertension   . IDDM (insulin dependent diabetes mellitus)     type 2.   . Diastolic heart failure 0000000    grade 2 diastolic dysfunction per Q000111Q echo  . Asthma   . Hyperlipidemia   . Fatty liver 2008    noted on ultrasound 2008  . Esophageal dysmotility 2008    noted on esophagram.  hx dysphagia.   . Arthritis   . Sleep apnea     wears CPAP  . Fatty tumor fatty tumor back  . Coronary atherosclerosis of native coronary artery   . Morbid obesity   . Myocardial infarction 2009  . Heart murmur   . Peripheral vascular disease   . GERD (gastroesophageal reflux disease) 2012    Barrets esophagus on bx 2012 and 2014.   . Anginal pain     occ; non-ischemic Lexiscan 09/2012  . CKD (chronic kidney disease) 04/2007    CKD stage 4(Dr. Erling Cruz)  . Complication of anesthesia     " DIFFICULTY BREATHING "  . Persistent atrial fibrillation     chads2 vasc score of at least 5  . Sick sinus syndrome   . Gouty  arthropathy     Past Surgical History  Procedure Laterality Date  . Coronary angioplasty with stent placement    . Breast lumpectomy      right  . Tubal ligation    . Tonsillectomy    . Total knee arthroplasty Right 12/15/2013    Procedure: RIGHT TOTAL KNEE ARTHROPLASTY;  Surgeon: Alta Corning, MD;  Location: [facility] [place];  Service: Orthopedics;  Laterality: Right;  . Cardioversion N/A 03/03/2014    Procedure: CARDIOVERSION;  Surgeon: Laverda Page, MD;  Location: [facility];  Service: Cardiovascular;  Laterality: N/A;  . Atrial flutter ablation  2008    CTI ablation by Dr Lovena Le  . Pacemaker insertion  06/18/14    STJ Assurity dual chamber pacemaker implanted by Dr Rayann Heman  . Right heart catheterization N/A 06/12/2014    Procedure: RIGHT HEART CATH;  Surgeon: Jolaine Artist, MD;  Location: [facility] CATH LAB;  Service: Cardiovascular;  Laterality: N/A;  . Permanent pacemaker insertion N/A 06/18/2014    SJM Assurity DR pacemaker implanted by Dr Rayann Heman for sick sinus syndrome  . Colonoscopy with propofol N/A 02/18/2015    Procedure: COLONOSCOPY WITH PROPOFOL;  Surgeon: Milus Banister, MD;  Location: [place];  Service: Endoscopy;  Laterality: N/A;    Allergies  Allergen Reactions  . Adhesive [  Tape] Itching    EKG leads  . Sulfa Antibiotics Itching and Nausea And Vomiting    "everything I seen was red"  . Codeine Nausea And Vomiting  . Penicillins Nausea And Vomiting    Family history and social history have been reviewed.  Review of Systems: Positive for cough. Positive for irregular bowel habits with predominant constipation  The remainder of the 10 point ROS is negative except as outlined in the H&P  Physical Exam: General Appearance Well developed, in no distress Eyes  Non icteric  HEENT  Non traumatic, normocephalic  Mouth No lesion, tongue papillated, no cheilosis Neck Supple without adenopathy, thyroid not enlarged, no carotid bruits, no JVD Lungs Clear to  auscultation bilaterally COR Normal S1, normal S2, regular rhythm, no murmur, quiet precordium Abdomen obese protuberant. Soft nontender with normoactive bowel sounds Rectal soft Hemoccult-negative stool Extremities  No pedal edema Skin No lesions Neurological Alert and oriented x 3 Psychological Normal mood and affect  Assessment and Plan:   69 year old African-American female with severe heart disease acute and chronic congestive heart failure. Atrial fibrillation ,on home oxygen. Currently on Eliquis, she is at high-risk for all endoscopic procedures  Recent anemia, hemoglobin 7.0. Currently up to 13.4 as a result of blood transfusion and iron supplements. She is Hemoccult-negative on my exam today, cecal AVM ablated. Might have been the source of GI blood loss. As long as she maintains her hemoglobin on iron supplements I don't recommend any further diagnostic evaluation. If her hemoglobin continues to drop despite of iron supplements then I  suggest small bowel capsule endoscopy  [name]'s esophagus in 2012 and again in December 2014. Recall endoscopy will be December 2017 if patient's general condition permits. She will have to  discontinue the anticoagulation  Chronic constipation. Continue Linzess 145 g daily     Delfin Edis 04/06/2015

## 2015-04-06 NOTE — Patient Instructions (Addendum)
We have sent  medications to your pharmacy for you to pick up at your convenience:  Ranitidine  Dr Trilby Drummer

## 2015-04-06 NOTE — Progress Notes (Signed)
Remote pacemaker transmission.   

## 2015-04-09 ENCOUNTER — Ambulatory Visit: Payer: Commercial Managed Care - HMO | Admitting: Adult Health

## 2015-04-09 LAB — CUP PACEART REMOTE DEVICE CHECK
Battery Remaining Longevity: 122 mo
Battery Remaining Percentage: 95.5 %
Battery Voltage: 3.01 V
Brady Statistic AP VP Percent: 1 %
Brady Statistic AP VS Percent: 96 %
Brady Statistic AS VP Percent: 1 %
Brady Statistic AS VS Percent: 3.2 %
Brady Statistic RA Percent Paced: 97 %
Brady Statistic RV Percent Paced: 1 %
Date Time Interrogation Session: 20160801071523
Lead Channel Impedance Value: 590 Ohm
Lead Channel Impedance Value: 630 Ohm
Lead Channel Pacing Threshold Amplitude: 1 V
Lead Channel Pacing Threshold Amplitude: 1.75 V
Lead Channel Pacing Threshold Pulse Width: 0.4 ms
Lead Channel Pacing Threshold Pulse Width: 0.5 ms
Lead Channel Sensing Intrinsic Amplitude: 12 mV
Lead Channel Sensing Intrinsic Amplitude: 5 mV
Lead Channel Setting Pacing Amplitude: 2 V
Lead Channel Setting Pacing Amplitude: 2 V
Lead Channel Setting Pacing Pulse Width: 0.5 ms
Lead Channel Setting Sensing Sensitivity: 2 mV
Pulse Gen Model: 2240
Pulse Gen Serial Number: 7665001

## 2015-04-17 ENCOUNTER — Other Ambulatory Visit (HOSPITAL_COMMUNITY): Payer: Self-pay | Admitting: Internal Medicine

## 2015-04-19 ENCOUNTER — Encounter: Payer: Self-pay | Admitting: Cardiology

## 2015-04-23 ENCOUNTER — Ambulatory Visit (HOSPITAL_COMMUNITY)
Admission: RE | Admit: 2015-04-23 | Discharge: 2015-04-23 | Disposition: A | Discharge status: Home / Self Care | Payer: Commercial Managed Care - HMO | Source: Ambulatory Visit | Attending: Cardiology | Admitting: Cardiology

## 2015-04-23 ENCOUNTER — Encounter (HOSPITAL_COMMUNITY): Payer: Self-pay

## 2015-04-23 VITALS — BP 128/74 | HR 61 | Wt 247.0 lb

## 2015-04-23 DIAGNOSIS — Z950 Presence of cardiac pacemaker: Secondary | ICD-10-CM | POA: Diagnosis not present

## 2015-04-23 DIAGNOSIS — I739 Peripheral vascular disease, unspecified: Secondary | ICD-10-CM | POA: Diagnosis not present

## 2015-04-23 DIAGNOSIS — G4730 Sleep apnea, unspecified: Secondary | ICD-10-CM | POA: Diagnosis not present

## 2015-04-23 DIAGNOSIS — J849 Interstitial pulmonary disease, unspecified: Secondary | ICD-10-CM | POA: Diagnosis not present

## 2015-04-23 DIAGNOSIS — I129 Hypertensive chronic kidney disease with stage 1 through stage 4 chronic kidney disease, or unspecified chronic kidney disease: Secondary | ICD-10-CM | POA: Insufficient documentation

## 2015-04-23 DIAGNOSIS — I4892 Unspecified atrial flutter: Secondary | ICD-10-CM | POA: Diagnosis not present

## 2015-04-23 DIAGNOSIS — N184 Chronic kidney disease, stage 4 (severe): Secondary | ICD-10-CM | POA: Insufficient documentation

## 2015-04-23 DIAGNOSIS — I5032 Chronic diastolic (congestive) heart failure: Secondary | ICD-10-CM | POA: Insufficient documentation

## 2015-04-23 DIAGNOSIS — E1122 Type 2 diabetes mellitus with diabetic chronic kidney disease: Secondary | ICD-10-CM | POA: Diagnosis not present

## 2015-04-23 DIAGNOSIS — I2510 Atherosclerotic heart disease of native coronary artery without angina pectoris: Secondary | ICD-10-CM | POA: Diagnosis not present

## 2015-04-23 DIAGNOSIS — I4891 Unspecified atrial fibrillation: Secondary | ICD-10-CM | POA: Insufficient documentation

## 2015-04-23 DIAGNOSIS — G4733 Obstructive sleep apnea (adult) (pediatric): Secondary | ICD-10-CM | POA: Diagnosis not present

## 2015-04-23 DIAGNOSIS — K219 Gastro-esophageal reflux disease without esophagitis: Secondary | ICD-10-CM | POA: Insufficient documentation

## 2015-04-23 DIAGNOSIS — Z87891 Personal history of nicotine dependence: Secondary | ICD-10-CM | POA: Diagnosis not present

## 2015-04-23 DIAGNOSIS — Z8249 Family history of ischemic heart disease and other diseases of the circulatory system: Secondary | ICD-10-CM | POA: Diagnosis not present

## 2015-04-23 DIAGNOSIS — I5033 Acute on chronic diastolic (congestive) heart failure: Secondary | ICD-10-CM

## 2015-04-23 DIAGNOSIS — Z79899 Other long term (current) drug therapy: Secondary | ICD-10-CM | POA: Insufficient documentation

## 2015-04-23 DIAGNOSIS — Z7902 Long term (current) use of antithrombotics/antiplatelets: Secondary | ICD-10-CM | POA: Insufficient documentation

## 2015-04-23 DIAGNOSIS — J45909 Unspecified asthma, uncomplicated: Secondary | ICD-10-CM | POA: Diagnosis not present

## 2015-04-23 DIAGNOSIS — I480 Paroxysmal atrial fibrillation: Secondary | ICD-10-CM

## 2015-04-23 DIAGNOSIS — E785 Hyperlipidemia, unspecified: Secondary | ICD-10-CM | POA: Diagnosis not present

## 2015-04-23 DIAGNOSIS — I252 Old myocardial infarction: Secondary | ICD-10-CM | POA: Insufficient documentation

## 2015-04-23 DIAGNOSIS — I495 Sick sinus syndrome: Secondary | ICD-10-CM | POA: Insufficient documentation

## 2015-04-23 LAB — BASIC METABOLIC PANEL
Anion gap: 13 (ref 5–15)
BUN: 41 mg/dL — ABNORMAL HIGH (ref 6–20)
CO2: 25 mmol/L (ref 22–32)
Calcium: 9.5 mg/dL (ref 8.9–10.3)
Chloride: 99 mmol/L — ABNORMAL LOW (ref 101–111)
Creatinine, Ser: 3.42 mg/dL — ABNORMAL HIGH (ref 0.44–1.00)
GFR calc Af Amer: 15 mL/min — ABNORMAL LOW (ref >60)
GFR calc non Af Amer: 13 mL/min — ABNORMAL LOW (ref >60)
Glucose, Bld: 253 mg/dL — ABNORMAL HIGH (ref 65–99)
Potassium: 3.5 mmol/L (ref 3.5–5.1)
Sodium: 137 mmol/L (ref 135–145)

## 2015-04-23 MED ORDER — AMIODARONE HCL 200 MG PO TABS: 200.0000 mg | ORAL_TABLET | Freq: Every day | ORAL | Status: DC

## 2015-04-23 NOTE — Patient Instructions (Signed)
INCREASE Amiodarone to 200 mg daily  Labs today  Your physician recommends that you schedule a follow-up appointment in: 2 months  Do the following things EVERYDAY: 1) Weigh yourself in the morning before breakfast. Write it down and keep it in a log. 2) Take your medicines as prescribed 3) Eat low salt foods-Limit salt (sodium) to 2000 mg per day.  4) Stay as active as you can everyday 5) Limit all fluids for the day to less than 2 liters 6)

## 2015-04-23 NOTE — Progress Notes (Signed)
Patient ID: [name], female   DOB: Jul 05, 1946, 69 y.o.   MRN: [id number]  PCP: Dr. Noah Delaine  Nephrologist: Dr. Florene Glen EP: Dr Rayann Heman   HPI: [name] is a 69 year old woman with hypertension, hyperlipidemia, bronchial asthma, diabetes mellitus, CKD stage IV (baseline cr ~2.5), atrial fibrillation and diastolic HF.  She was admitted in 4/15 for R TKR. That hospitalization complicated by a/c diastolic HF with respiratory distress requiring non-rebreather support. Weight on discharge was 226 pounds. ABG at that time 7.4/34/91/97%. Cr peaked at 2.9  Admitted 6/30-7/10/15 for SOB and CP following scheduled cardioversion. Found to have severe aspiration PNA and was intubated. She maintained SR for short period of time and then went back into Afib/Aflutter.  HR controlled on amio and diltiazem. Discharged to [facility] at a weight of 232 lbs.   Admitted 06/16/14 with symptomatic tachy/brady syndrome. 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 Discharge weight was 223 pounds.   Admitted 02/16/15 for symptomatic anemia s/p blood work from her pulmonologist.  Was diuresed with lasix while in hospital.   Last echo in 6/16 with EF 60-65%, PASP 37 mmHg.   PFTs (6/16) with FVC 80%, FEV1 83%, ratio 103%, TLC 75%, DLCO 78% => restriction c/w interstitial process.  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   Holter (7/16) with rare PACs, PVCs; no atrial fibrillation.   She returns today for HF follow up. At last visit we decreased her amiodarone due to concern for possible amiodarone lung toxicity. She also had a 48 Holter monitor that showed rare PVCs and PACs with no afib.  High resolution CT (as above) suggested NSIP but did not have findings specific for amiodarone lung toxicity. For the past 2 days has had nausea and fatigue.  She states she feels like her heart is fluttering more often, and it is worse in the  evening.  She is up 20 lbs from her last visit by our scales. She is also constipated x 2 days. She is taking torsemide 40 mg bid. She is no longer taking metolazone. She is not SOB at rest.  She has SOB with minimal exertion, but states she feels better than she has in the past. She is SOB walking to the bathroom. Sleeps on 3 pillows.   She continues to limit her fluid and salt intake. Still has cough.  Dizzy only on occasion. She says she is using her CPAP.  Labs: 03/13/14: K+ 3.4, creatinine 2.10, BUN 16 04/02/14: K 5.6, creatinine 2.66, BUN 29 04/13/14: K 4.0, creatinine 2.66, K 5.6 06/20/14 K 3.7 creatinine 2.35  07/21/14 K 3.6 creatinine 2.55 2/16 K 3.8, creatinine 3.03, LFTs normal, TSH normal, hgb 12.8  5/16 K 3.5, creatinine 3.37, LFTs normal, TSH mildly elevated, T3 just below normal, T4 normal. BNP 147 6/16 K 3.9, creatinine 2.98, BNP 364.2, SCL-70 negative, RF elevated but CCP negative, ANA negative 7/16 K 3.4, creatinine 3.31 LFTs normal, TSH and T4 normal, T3 just below normal, HCT 42.4  ECG: NSR, LVH with repolarization abnormality.   ROS: All systems negative except as listed in HPI, PMH and Problem List.  SH:  Social History   Social History  . Marital Status: Widowed    Spouse Name: N/A  . Number of Children: 3  . Years of Education: N/A   Occupational History  . Disabled    Social History Main Topics  . Smoking status: Former Smoker -- 0.50 packs/day for  35 years    Types: Cigarettes    Quit date: 02/16/1995  . Smokeless tobacco: Never Used     Comment: 05/2014  QUIT OVER 20 YEARS AGO "  . Alcohol Use: No  . Drug Use: No  . Sexual Activity: Not Currently   Other Topics Concern  . Not on file   Social History Narrative    FH:  Family History  Problem Relation Age of Onset  . Heart disease Mother   . Bladder Cancer Mother   . Kidney disease Mother   . Ovarian cancer Daughter   . Stomach cancer Maternal Uncle   . Colon cancer Maternal Aunt     dx in  her 76's  . Esophageal cancer Neg Hx     Past Medical History  Diagnosis Date  . Atrial flutter     ablated by Dr Lovena Le in 2008  . Hypertension   . IDDM (insulin dependent diabetes mellitus)     type 2.   . Diastolic heart failure 0000000    grade 2 diastolic dysfunction per [id number] echo  . Asthma   . Hyperlipidemia   . Fatty liver 2008    noted on ultrasound 2008  . Esophageal dysmotility 2008    noted on esophagram.  hx dysphagia.   . Arthritis   . Sleep apnea     wears CPAP  . Fatty tumor fatty tumor back  . Coronary atherosclerosis of native coronary artery   . Morbid obesity   . Myocardial infarction 2009  . Heart murmur   . Peripheral vascular disease   . GERD (gastroesophageal reflux disease) 2012    Barrets esophagus on bx 2012 and 2014.   . Anginal pain     occ; non-ischemic Lexiscan 09/2012  . CKD (chronic kidney disease) 04/2007    CKD stage 4(Dr. Erling Cruz)  . Complication of anesthesia     " DIFFICULTY BREATHING "  . Persistent atrial fibrillation     chads2 vasc score of at least 5  . Sick sinus syndrome   . Gouty arthropathy    Current Outpatient Prescriptions  Medication Sig Dispense Refill  . albuterol (PROVENTIL) (2.5 MG/3ML) 0.083% nebulizer solution Take 2.5 mg by nebulization every 6 (six) hours as needed for shortness of breath. Use four times a day as needed for shortness of breath or wheezing    . amiodarone (PACERONE) 200 MG tablet Take 1 tablet (200 mg total) by mouth daily. 30 tablet 6  . amitriptyline (ELAVIL) 25 MG tablet Take 25 mg by mouth at bedtime.      Marland Kitchen apixaban (ELIQUIS) 5 MG TABS tablet Take 1 tablet (5 mg total) by mouth 2 (two) times daily. 60 tablet 6  . atorvastatin (LIPITOR) 40 MG tablet TAKE ONE TABLET BY MOUTH ONCE DAILY 90 tablet 0  . dicyclomine (BENTYL) 10 MG capsule Take 10 mg by mouth daily.     Marland Kitchen diltiazem (CARDIZEM CD) 180 MG 24 hr capsule Take 1 capsule (180 mg total) by mouth daily. 30 capsule 3  . esomeprazole  (NEXIUM) 40 MG capsule Take 1 capsule (40 mg total) by mouth daily at 12 noon. 30 capsule 0  . febuxostat (ULORIC) 40 MG tablet Take 40 mg by mouth daily.     . ferrous fumarate (HEMOCYTE - 106 MG FE) 325 (106 FE) MG TABS tablet Take 1 tablet (106 mg of iron total) by mouth daily with breakfast. 30 each 4  . hydrALAZINE (APRESOLINE) 25 MG tablet Take 1  tablet (25 mg total) by mouth 3 (three) times daily. 90 tablet 6  . insulin glargine (LANTUS) 100 UNIT/ML injection Inject 0.1 mLs (10 Units total) into the skin at bedtime. 10 mL 2  . insulin lispro (HUMALOG) 100 UNIT/ML injection Inject 0.2 mLs (20 Units total) into the skin 3 (three) times daily after meals. (Patient taking differently: Inject 50 Units into the skin every morning. ) 10 mL 11  . Linaclotide (LINZESS) 145 MCG CAPS capsule Take 290 mcg by mouth daily. Does 145 mcg most days and then 223mcg occ. To aid bowels    . metoprolol tartrate (LOPRESSOR) 25 MG tablet TAKE THREE TABLETS BY MOUTH TWICE DAILY 180 tablet 0  . nitroGLYCERIN (NITROSTAT) 0.4 MG SL tablet Place 0.4 mg under the tongue every 5 (five) minutes as needed for chest pain.     . polyethylene glycol (MIRALAX / GLYCOLAX) packet Take 17 g by mouth daily. 30 each 0  . potassium chloride SA (K-DUR,KLOR-CON) 20 MEQ tablet Take 3 tabs in the AM and 2 tabs in the PM 150 tablet 2  . ranitidine (ZANTAC) 150 MG capsule Take 1 capsule (150 mg total) by mouth every evening. 90 capsule 3  . ranitidine (ZANTAC) 150 MG tablet TAKE ONE TABLET BY MOUTH AT BEDTIME  30 tablet 0  . torsemide (DEMADEX) 20 MG tablet Take 2 tablets (40 mg total) by mouth 2 (two) times daily. 120 tablet 2  . Vitamin D, Ergocalciferol, (DRISDOL) 50000 UNITS CAPS capsule Take 50,000 Units by mouth every Monday.    Penne Lash HFA 45 MCG/ACT inhaler Inhale 2 puffs into the lungs every 4 (four) hours as needed for wheezing or shortness of breath. 1 Inhaler 1  . zolpidem (AMBIEN) 10 MG tablet Take 10 mg by mouth at bedtime.       No current facility-administered medications for this encounter.     Filed Vitals:   04/23/15 1105  BP: 128/74  Pulse: 61  Weight: 247 lb (112.038 kg)  SpO2: 93%    PHYSICAL EXAM: General: Obese, NAD, in wheelchair. Daughter present HEENT: normal  Neck: supple. JVP 7 cm; Carotids 2+ bilat; no bruits. No lymphadenopathy or thryomegaly  Cor: PMI nonpalpbale. Distant. Regular rate. No murmur appreciated Lungs:  CTA Abdomen: Obese, soft, nontender. mild-distended. No hepatosplenomegaly. No bruits or masses. +BS Extremities: no cyanosis, clubbing, rash, warm. No edema.  Neuro: alert & orientedx3, cranial nerves grossly intact. moves all 4 extremities w/o difficulty. Affect pleasant   ASSESSMENT & PLAN:  1) Chronic diastolic HF: EF 123456 (123XX123 echo). NYHA class IIIb symptoms, stable.  Weight is up by our scale, but she does not appear particularly volume overloaded on exam.  Dyspnea likely has a prominent pulmonary component with restrictive PFTs from body habitus and suspected interstitial lung disease (NSIP pattern by high resolution CT).  - Check BMET/BNP today.   - Continue torsemide 40 mg bid and KCl 60/40.   2) Afib/Aflutter:  She remains in NSR on amiodarone.  At last visit, we decreased amiodarone to 100 mg daily and she has had palpitations more often since then.  Holter showed only PVCs and PACs. LFTs and TSH normal in 7/16.  High resolution CT did not show findings suggestive of interstitial lung disease.  She will need regular eye exams on amiodarone.  - Increase amiodarone back to 200 mg daily with symptomatic palpitations.  - No bleeding problems on Eliquis (h/o cecal AVM s/p ablation).   3) CKD stage IV: BMET today.  4) Tachy/Brady syndrome: St Jude PPM.   5) OSA: She is having difficulty using CPAP. She says who she currently uses will not replace without her paying up front.  6) Interstitial lung disease: ?NSIP.  To see pulmonary on Monday.   Shirley Friar PA-C 04/23/2015   Patient seen with PA, agree with the above note.  She has chronic dyspnea, this seems severe but stable.  She does not look volume overloaded so will not increase torsemide.  I suspect a lot of this dyspnea is from ILD and body habitus.  She is tolerating Eliquis and is in NSR.  Increase amiodarone back to 200 mg daily with increased palpitations.   [name] 04/24/2015 5:09 PM

## 2015-04-24 IMAGING — CR DG CHEST 1V PORT
1 series · 1 of 1 positions shown · non-contrast
Comparison: Portable chest x-ray March 08, 2014

CLINICAL DATA: Shortness of breath and cough

EXAM:
PORTABLE CHEST - 1 VIEW

[AP]
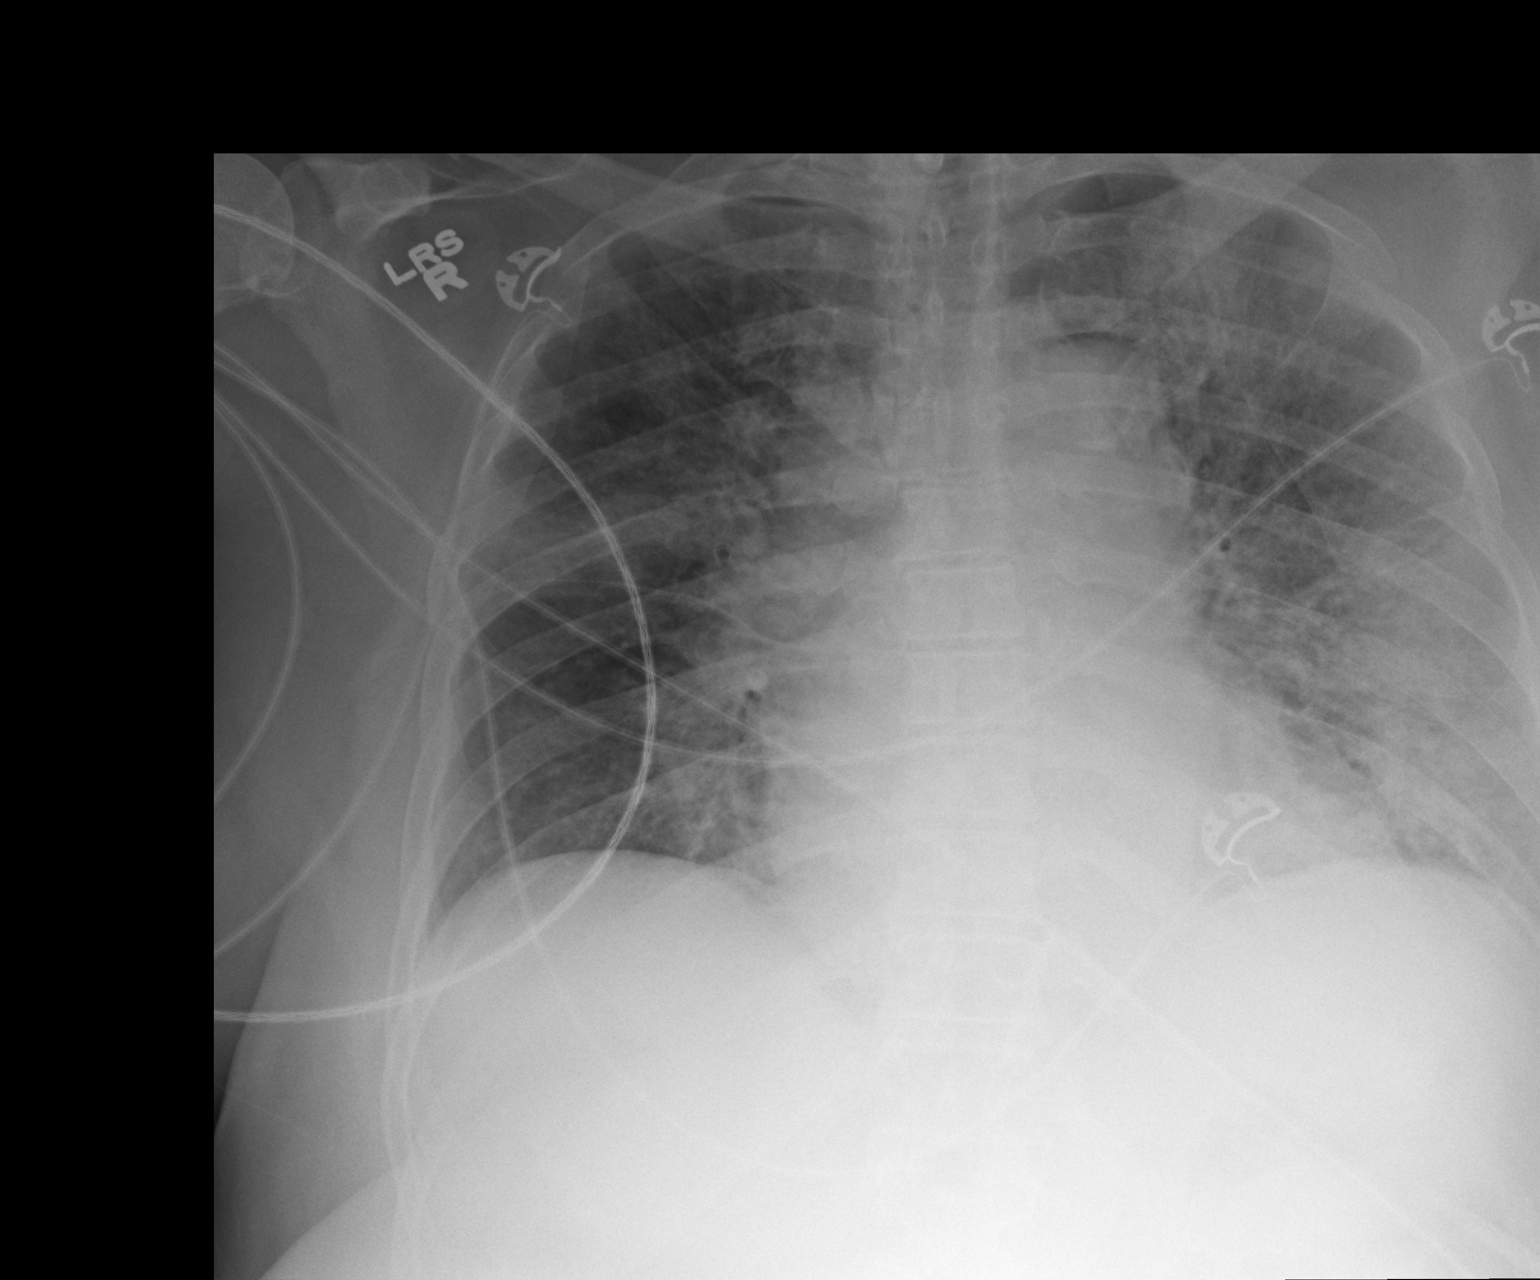

[1 of 1 positions shown; findings below may reference images not displayed]

FINDINGS: The lungs are adequately inflated. The interstitial markings while
increased have improved since yesterday's study. Densities in the
upper lobes are less conspicuous but persist. The cardiac silhouette
remains mildly enlarged. The central pulmonary vascularity is
indistinct. The right internal jugular venous catheter has been
removed.
IMPRESSION: Improving interstitial edema secondary to CHF. When the patient can
tolerate the procedure, a PA and lateral chest x-ray would be of
value.

## 2015-04-26 ENCOUNTER — Ambulatory Visit: Payer: Commercial Managed Care - HMO | Admitting: Adult Health

## 2015-04-27 ENCOUNTER — Encounter: Payer: Self-pay | Admitting: Internal Medicine

## 2015-05-03 ENCOUNTER — Other Ambulatory Visit (HOSPITAL_COMMUNITY): Payer: Self-pay | Admitting: Internal Medicine

## 2015-05-07 ENCOUNTER — Ambulatory Visit (INDEPENDENT_AMBULATORY_CARE_PROVIDER_SITE_OTHER): Payer: Commercial Managed Care - HMO | Admitting: Adult Health

## 2015-05-07 ENCOUNTER — Ambulatory Visit: Payer: Commercial Managed Care - HMO | Admitting: Adult Health

## 2015-05-07 ENCOUNTER — Encounter: Payer: Self-pay | Admitting: Adult Health

## 2015-05-07 VITALS — BP 128/84 | HR 60 | Temp 97.7°F | Ht 64.0 in | Wt 247.0 lb

## 2015-05-07 DIAGNOSIS — Z23 Encounter for immunization: Secondary | ICD-10-CM

## 2015-05-07 DIAGNOSIS — G4733 Obstructive sleep apnea (adult) (pediatric): Secondary | ICD-10-CM

## 2015-05-07 DIAGNOSIS — J849 Interstitial pulmonary disease, unspecified: Secondary | ICD-10-CM | POA: Diagnosis not present

## 2015-05-07 NOTE — Assessment & Plan Note (Signed)
ILD change on CT chest  Pt is on Amiodarone with low DLCO on PFT and elevated ESR  RA factor positive on autoimmune w/up  Will need to discuss with Dr. Halford Chessman  And Cardiology regarding Amio Will refer back to rheumatology to discuss positive RA factor .  follow up in 4 weeks with Dr. Halford Chessman   Cont on O2 .

## 2015-05-07 NOTE — Progress Notes (Signed)
Subjective:    Patient ID: [name], female    DOB: [birth year], 69 y.o.   MRN: [id number]  HPI [name] is a 69 y.o. female former smoker with aspiration PNA . OSA  Gr 2 DCHF ,    Tests: Echo 03/06/14 >> mild LVH, EF 60 to 65% Echo 02/2015 with Gr 2 D CHF , PAP 38 , EF 60%.  PFT 02/04/15 >> FEV1 1.88 (100%), FEV1% 83, TLC 3.79 (75%), DLCO 28%, +BD   05/07/2015 Follow up OSA and Abn CT chest  Pt has OSA on CPAP at bedtime No recent download. Says machine does not work good, can not use O2 with machine b/c so old.  Requests new machine. Does not like DME company , charges her high prices.   Complains that she continues to have dyspnea/DOE with dry cough . Gets winded easily  On O2 at 3l/m  Pt remains on Amiodarone . ESR elevated in June ~48 Pulmonary function test in June showed a significantly low diffusing capacity. Previous lab work was positive for rheumatoid factor. Rest of autoimmune and connective tissue labs were negative. High resolution CT chest on July 22 did show findings compatible with interstitial lung disease. There was ground glass attenuation. Favor NSIP .  2-D echo in June did show some mild pulmonary hypertension and grade 2 diastolic heart failure. Patient does complain of generalized joint pain. Patient says that she has seen rheumatology in the past but did not follow-up. No records available at today's visit.  She denies any hemoptysis, chest pain, orthopnea, PND, or increased leg swelling.    Review of Systems Constitutional:   No  weight loss, night sweats,  Fevers, chills,  +fatigue, or  lassitude.  HEENT:   No headaches,  Difficulty swallowing,  Tooth/dental problems, or  Sore throat,                No sneezing, itching, ear ache, nasal congestion, post nasal drip,   CV:  No chest pain,  Orthopnea, PND, swelling in lower extremities, anasarca, dizziness, palpitations, syncope.   GI  No heartburn, indigestion, abdominal pain, nausea,  vomiting, diarrhea, change in bowel habits, loss of appetite, bloody stools.   Resp:    No chest wall deformity  Skin: no rash or lesions.  GU: no dysuria, change in color of urine, no urgency or frequency.  No flank pain, no hematuria   MS:  No joint pain or swelling.  No decreased range of motion.  No back pain.  Psych:  No change in mood or affect. No depression or anxiety.  No memory loss.         Objective:   Physical Exam GEN: A/Ox3; pleasant , NAD, elderly , obese , in wheelchair  HEENT:  [place]/AT,  EACs-clear, TMs-wnl, NOSE-clear, THROAT-clear, no lesions, no postnasal drip or exudate noted.   NECK:  Supple w/ fair ROM; no JVD; normal carotid impulses w/o bruits; no thyromegaly or nodules palpated; no lymphadenopathy.  RESP  Decreased BS in bases .no accessory muscle use, no dullness to percussion  CARD:  RRR, no m/r/g  , tr  peripheral edema, pulses intact, no cyanosis or clubbing.  GI:   Soft & nt; nml bowel sounds; no organomegaly or masses detected.  Musco: Warm bil, no deformities. Arthritic changes in hands   Neuro: alert, no focal deficits noted.    Skin: Warm, no lesions or rashes        Assessment & Plan:

## 2015-05-07 NOTE — Assessment & Plan Note (Signed)
Pt with old CPAP machine  Will request new machine and company   Plan  Continue on CPAP At bedtime   Order for new DME and CPAP machine .  Follow up Dr. Halford Chessman  In 4 weeks  Please contact office for sooner follow up if symptoms do not improve or worsen or seek emergency care

## 2015-05-07 NOTE — Patient Instructions (Addendum)
CT chest shows Interstitial Lung Disease .  Refer back to Rheumatology for evaluation .  Continue on CPAP At bedtime   Order for new DME and CPAP machine .  Remain on Oxygen 3l/m .  Follow up Dr. Halford Chessman  In 4 weeks  Please contact office for sooner follow up if symptoms do not improve or worsen or seek emergency care

## 2015-05-08 ENCOUNTER — Other Ambulatory Visit (HOSPITAL_COMMUNITY): Payer: Self-pay | Admitting: Internal Medicine

## 2015-05-08 NOTE — Progress Notes (Signed)
Reviewed.  HCRT did not show typical changes for amiodarone toxicity in lungs.  More concerned about possible connective tissue disease with lung involvement.  Agree with rheumatology assessment.  Will re-assess her status at next ROV.  ?is whether she might need lung tissue sampling to further assess cause of ILD.

## 2015-05-11 ENCOUNTER — Telehealth: Payer: Self-pay | Admitting: Pulmonary Disease

## 2015-05-11 ENCOUNTER — Other Ambulatory Visit (HOSPITAL_COMMUNITY): Payer: Self-pay | Admitting: Internal Medicine

## 2015-05-11 DIAGNOSIS — J849 Interstitial pulmonary disease, unspecified: Secondary | ICD-10-CM

## 2015-05-11 DIAGNOSIS — J9621 Acute and chronic respiratory failure with hypoxia: Secondary | ICD-10-CM

## 2015-05-11 DIAGNOSIS — G4733 Obstructive sleep apnea (adult) (pediatric): Secondary | ICD-10-CM

## 2015-05-11 NOTE — Telephone Encounter (Signed)
Melissa with [facility] was in office and will take care of order. Staff message sent as reminder.

## 2015-05-11 NOTE — Telephone Encounter (Signed)
Spoke with Melissa at [facility] she needs the "recovering" sat documented as well Estill Bamberg is correcting the [facility] note box with that info); also she needs to have order placed with [employer] for CPAP and O2 in EPIC. This has been taken care of.    I was able to contact [employer] to get settings of CPAP and usage for O2 on patient. Orders placed and nothing more needed at this time.

## 2015-05-27 ENCOUNTER — Telehealth: Payer: Self-pay | Admitting: *Deleted

## 2015-05-27 ENCOUNTER — Other Ambulatory Visit: Payer: Self-pay | Admitting: *Deleted

## 2015-05-27 DIAGNOSIS — D509 Iron deficiency anemia, unspecified: Secondary | ICD-10-CM

## 2015-05-27 NOTE — Telephone Encounter (Signed)
Spoke with patient and she would like to set up [place] in November.Scheduled with Dr. Havery Moros. She is due for CBC to f/u on hgb. Is it ok to order this?

## 2015-05-27 NOTE — Telephone Encounter (Signed)
Lab in EPIC. Patient aware. 

## 2015-05-27 NOTE — Telephone Encounter (Signed)
Yes that's fine. Thanks  

## 2015-05-27 NOTE — Telephone Encounter (Signed)
-----   Message from Hulan Saas, RN sent at 05/04/2015  3:42 PM EDT ----- No GI yet ----- Message -----    From: Hulan Saas, RN    Sent: 05/20/2015      To: Hulan Saas, RN  Call and remind due for CBC on 05/24/15 for DB. Lab in.

## 2015-06-01 ENCOUNTER — Other Ambulatory Visit (HOSPITAL_COMMUNITY): Payer: Self-pay | Admitting: Internal Medicine

## 2015-06-02 ENCOUNTER — Other Ambulatory Visit (HOSPITAL_COMMUNITY): Payer: Self-pay | Admitting: Internal Medicine

## 2015-06-18 ENCOUNTER — Ambulatory Visit: Payer: Commercial Managed Care - HMO | Admitting: Pulmonary Disease

## 2015-06-30 NOTE — Patient Outreach (Signed)
[place] [facility]) Care Management  06/30/2015  [name] August 13, 1946 [id number]   Referral from [facility] tier 4 list, assigned to [name], RN for patient outreach.  [name], [place] Care Management Assistant

## 2015-07-01 ENCOUNTER — Other Ambulatory Visit (HOSPITAL_COMMUNITY): Payer: Self-pay | Admitting: Internal Medicine

## 2015-07-02 ENCOUNTER — Other Ambulatory Visit: Payer: Self-pay | Admitting: *Deleted

## 2015-07-02 DIAGNOSIS — I5032 Chronic diastolic (congestive) heart failure: Secondary | ICD-10-CM

## 2015-07-02 NOTE — Patient Outreach (Signed)
[name]) Care Management  07/02/2015  [name] Aug 21, 1946 [id number]   Assessment: Telephone screen Referral from [facility] tier 4 list per care management assistant (D. Rhodie). Diagnosis listed is DM, CHF, and COPD with 2 ED visits and 2 admits.   Call placed and spoke to patient. Care management coordinator introduced self and explained purpose of the call. Verbal consent obtained from patient.  Patient states had been involved with [facility] care management services about 4 months ago Loss adjuster, chartered and Education officer, museum). Telephone screen completed.  Ms. Cofrancesco informed care management coordinator that she was finally been approved for Medicaid since yesterday 10/27. She reports that "meals on wheels" supply her food once a day. She lives with 2 daughters [name] and [name]) who help manage her medications and provide transport to her appointments. [name] is available every Friday to transport her to [place] appointments and [name] on weekday appointments. Patient denies any problem with medication supplies and takes her medications as prescribed through her daughter's help [name]- manages medications).  She reports home oxygen use.  Last visit with primary care provider was 05/21/15. Patient agreed to [facility] health coach services for more knowledge and information of her diagnoses.   Plan: Will refer patient to health coach for further disease information and management.  [name] A. Matt Delpizzo, BSN, RN-BC [place] Management Coordinator Cell: [phone]

## 2015-07-05 NOTE — Patient Outreach (Signed)
[facility] [facility]) Care Management  07/05/2015  SHERRILYN TIBURCIO 25-Mar-1946 [id number]   Request from [name], RN to assign [place], assigned [name], RN.  Thanks, [name]. [place], [place] Assistant Phone: [phone] Fax: [phone]

## 2015-07-07 ENCOUNTER — Ambulatory Visit (INDEPENDENT_AMBULATORY_CARE_PROVIDER_SITE_OTHER): Payer: Commercial Managed Care - HMO | Admitting: *Deleted

## 2015-07-07 DIAGNOSIS — I495 Sick sinus syndrome: Secondary | ICD-10-CM

## 2015-07-08 NOTE — Progress Notes (Signed)
Remote pacemaker transmission.   

## 2015-07-09 ENCOUNTER — Other Ambulatory Visit (HOSPITAL_COMMUNITY): Payer: Self-pay | Admitting: Internal Medicine

## 2015-07-09 ENCOUNTER — Other Ambulatory Visit (INDEPENDENT_AMBULATORY_CARE_PROVIDER_SITE_OTHER): Payer: Commercial Managed Care - HMO

## 2015-07-09 ENCOUNTER — Encounter: Payer: Self-pay | Admitting: Gastroenterology

## 2015-07-09 ENCOUNTER — Ambulatory Visit (INDEPENDENT_AMBULATORY_CARE_PROVIDER_SITE_OTHER): Payer: Commercial Managed Care - HMO | Admitting: Gastroenterology

## 2015-07-09 VITALS — BP 136/80 | HR 64 | Ht 64.0 in | Wt 254.0 lb

## 2015-07-09 DIAGNOSIS — K5900 Constipation, unspecified: Secondary | ICD-10-CM | POA: Diagnosis not present

## 2015-07-09 DIAGNOSIS — K2270 Barrett's esophagus without dysplasia: Secondary | ICD-10-CM | POA: Diagnosis not present

## 2015-07-09 DIAGNOSIS — I509 Heart failure, unspecified: Secondary | ICD-10-CM | POA: Diagnosis not present

## 2015-07-09 DIAGNOSIS — D649 Anemia, unspecified: Secondary | ICD-10-CM | POA: Diagnosis not present

## 2015-07-09 LAB — CBC WITH DIFFERENTIAL/PLATELET
Basophils Absolute: 0 10*3/uL (ref 0.0–0.1)
Basophils Relative: 0.3 % (ref 0.0–3.0)
Eosinophils Absolute: 0.4 10*3/uL (ref 0.0–0.7)
Eosinophils Relative: 6.4 % — ABNORMAL HIGH (ref 0.0–5.0)
HCT: 45.1 % (ref 36.0–46.0)
Hemoglobin: 14.9 g/dL (ref 12.0–15.0)
Lymphocytes Relative: 17.5 % (ref 12.0–46.0)
Lymphs Abs: 1.2 10*3/uL (ref 0.7–4.0)
MCHC: 33.1 g/dL (ref 30.0–36.0)
MCV: 92.6 fl (ref 78.0–100.0)
Monocytes Absolute: 0.8 10*3/uL (ref 0.1–1.0)
Monocytes Relative: 11.9 % (ref 3.0–12.0)
Neutro Abs: 4.5 10*3/uL (ref 1.4–7.7)
Neutrophils Relative %: 63.9 % (ref 43.0–77.0)
Platelets: 163 10*3/uL (ref 150.0–400.0)
RBC: 4.87 Mil/uL (ref 3.87–5.11)
RDW: 15.7 % — ABNORMAL HIGH (ref 11.5–15.5)
WBC: 7 10*3/uL (ref 4.0–10.5)

## 2015-07-09 NOTE — Patient Instructions (Signed)
Your physician has requested that you go to the basement for the following lab work before leaving today: CBC  

## 2015-07-09 NOTE — Progress Notes (Signed)
HPI :  69 y/o female: former patient of Dr. Olevia Perches and new to me, She is here for follow up for anemia. She was noted to have a severe anemia with Hgb of 7, leading to hospitalization in June 2016. She has a history of CHF, AF, and CKD stage IV, on Eliquis. 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 Her Hgb normalized after having this done. She doesn't usually blood in the stools, she has noted a small amount on a few occasions, thought to be due to hemorrhoids. She otherwise has severe constipation, she takes Linzess to have a bowel movement, which can help her. She has chronic constipation. She is on Eliquis for history of heart failure and a flutter. She denies any NSAID use. Her bowel habits are stable. She was previously on oral iron but this has since been stopped. She also takes nexium once daily for history of [name]'s esophagus. She has a history of globus at times which bothers her at times.  She has some mild heartburn but does not bother her significantly.   Patient has significant cardiac cormobidities and is on home oxygen. Wheelchair bound, with poor functional status. Seen with her daughter today.   Endoscopic history: EGD 08/2013 - irregular zline c/w BE Colonoscopy 6/16 - cecal AVM ablated, hemorrhoids noted   Past Medical History  Diagnosis Date  . Atrial flutter ([place])     ablated by Dr Lovena Le in 2008  . Hypertension   . IDDM (insulin dependent diabetes mellitus) ([place])     type 2.   . Diastolic heart failure 0000000    grade 2 diastolic dysfunction per [id number] echo  . Asthma   . Hyperlipidemia   . Fatty liver 2008    noted on ultrasound 2008  . Esophageal dysmotility 2008    noted on esophagram.  hx dysphagia.   . Arthritis   . Sleep apnea     wears CPAP  . Fatty tumor fatty tumor back  . Coronary atherosclerosis of native coronary artery   . Morbid obesity ([place])   . Myocardial infarction ([place])  2009  . Heart murmur   . Peripheral vascular disease ([place])   . GERD (gastroesophageal reflux disease) 2012    Barrets esophagus on bx 2012 and 2014.   . Anginal pain ([place])     occ; non-ischemic Lexiscan 09/2012  . CKD (chronic kidney disease) 04/2007    CKD stage 4(Dr. Erling Cruz)  . Complication of anesthesia      DIFFICULTY BREATHING   . Persistent atrial fibrillation ([facility])     chads2 vasc score of at least 5  . Sick sinus syndrome ([place])   . Gouty arthropathy      Past Surgical History  Procedure Laterality Date  . Coronary angioplasty with stent placement    . Breast lumpectomy      right  . Tubal ligation    . Tonsillectomy    . Total knee arthroplasty Right 12/15/2013    Procedure: RIGHT TOTAL KNEE ARTHROPLASTY;  Surgeon: Alta Corning, MD;  Location: [facility];  Service: Orthopedics;  Laterality: Right;  . Cardioversion N/A 03/03/2014    Procedure: CARDIOVERSION;  Surgeon: Laverda Page, MD;  Location: [place];  Service: Cardiovascular;  Laterality: N/A;  . Atrial flutter ablation  2008    CTI ablation by Dr Lovena Le  . Pacemaker insertion  06/18/14    STJ Assurity dual  chamber pacemaker implanted by Dr Rayann Heman  . Right heart catheterization N/A 06/12/2014    Procedure: RIGHT HEART CATH;  Surgeon: Jolaine Artist, MD;  Location: [facility] CATH LAB;  Service: Cardiovascular;  Laterality: N/A;  . Permanent pacemaker insertion N/A 06/18/2014    SJM Assurity DR pacemaker implanted by Dr Rayann Heman for sick sinus syndrome  . Colonoscopy with propofol N/A 02/18/2015    Procedure: COLONOSCOPY WITH PROPOFOL;  Surgeon: Milus Banister, MD;  Location: [place];  Service: Endoscopy;  Laterality: N/A;   Family History  Problem Relation Age of Onset  . Heart disease Mother   . Bladder Cancer Mother   . Kidney disease Mother   . Ovarian cancer Daughter   . Stomach cancer Maternal Uncle   . Colon cancer Maternal Aunt     dx in her 46's  . Esophageal cancer Neg Hx    Social History   Substance Use Topics  . Smoking status: Former Smoker -- 0.50 packs/day for 35 years    Types: Cigarettes    Quit date: 02/16/1995  . Smokeless tobacco: Never Used     Comment: 05/2014  QUIT OVER 20 YEARS AGO "  . Alcohol Use: No    Allergies  Allergen Reactions  . Adhesive [Tape] Itching    EKG leads  . Sulfa Antibiotics Itching and Nausea And Vomiting    everything I seen was red  . Codeine Nausea And Vomiting  . Penicillins Nausea And Vomiting   All medications reviewed in Epic.   Review of Systems: All systems reviewed and negative except where noted in HPI.   Lab Results  Component Value Date   WBC 7.0 07/09/2015   HGB 14.9 07/09/2015   HCT 45.1 07/09/2015   MCV 92.6 07/09/2015   PLT 163.0 07/09/2015    Lab Results  Component Value Date   ALT 23 03/19/2015   AST 33 03/19/2015   ALKPHOS 70 03/19/2015   BILITOT 0.5 03/19/2015   Lab Results  Component Value Date   CREATININE 3.42* 04/23/2015   BUN 41* 04/23/2015   NA 137 04/23/2015   K 3.5 04/23/2015   CL 99* 04/23/2015   CO2 25 04/23/2015     Physical Exam: BP 136/80 mmHg  Pulse 64  Ht 5\' 4"  (1.626 m)  Wt 254 lb (115.214 kg)  BMI 43.58 kg/m2 Constitutional: Pleasant,well-developed, obese female in no acute distress, sitting in wheelchair HEENT: Normocephalic and atraumatic. Conjunctivae are normal. No scleral icterus. Neck supple.  Cardiovascular: Normal rate, regular rhythm.  Pulmonary/chest: Effort normal and breath sounds normal. No wheezing, rales or rhonchi. Abdominal: Soft, nondistended, protuberant, nontender. Bowel sounds active throughout. There are no masses palpable.  Extremities: trace edema B LE Lymphadenopathy: No cervical adenopathy noted. Neurological: Alert and oriented to person place and time. Skin: Skin is warm and dry. No rashes noted. Psychiatric: Normal mood and affect. Behavior is normal.   ASSESSMENT AND PLAN: 69 y/o female with significant cardiac history on Eliquis  and home oxygen who was previously admitted for symptomatic anemia. 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 Her Hgb has since normalized and remained stable off oral iron. She is high risk for endoscopic procedures. Given her Hgb is stable with a previously noted cause that was treated, I don't think she needs further workup for this issue. If anemia recurs in the future consider EGD or capsule but again she is high risk for anesthesia and does not want further invasive testing. Hgb obtained today  and confirmed stable.   Patient otherwise has longstanding constipation which is well controlled on Linzess.Continue present regimen.   Reported history of [name]'s - explained low risk of irregular zline, and guidelines recommend no biopsies of the z-line due to low risk of malignancy. Can consider surveillance EGD in the future pending health status but she wishes to avoid it for now which I would agree with. She can follow up for reassessment of this next year.   T[place]jillo Alto Cellar, MD [facility]astro[facility]ology Pager [phone]

## 2015-07-12 NOTE — Telephone Encounter (Signed)
RX refill for metoprolol sent

## 2015-07-13 ENCOUNTER — Other Ambulatory Visit: Payer: Self-pay

## 2015-07-13 NOTE — Patient Outreach (Signed)
[facility]) Care Management  07/13/2015  [name] [birth year] [id number]   New referral contacted today for initial assessment.  Patient was a [facility] client in the past, but is referred again from the [facility] tier 4 list.  Briefly reviewed [facility] disease management services, and patient is interested in participating.  However, she asked to reschedule our assessment for Thursday 07-15-15 because she feels ill today.  She reports a cough productive of discolored sputum and thinks she may have a respiratory infection.  She has an appointment with Dr. Noah Delaine for tomorrow.  Plan:  Patient will keep her appointment with PCP scheduled for tomorrow.           RN Health Coach will call on 07-15-15 to complete initial assessment.           RN Health Coach will Medical laboratory scientific officer.  [name], RN, MSN [place] [phone] Fax [phone]

## 2015-07-14 DIAGNOSIS — J40 Bronchitis, not specified as acute or chronic: Secondary | ICD-10-CM | POA: Diagnosis not present

## 2015-07-14 DIAGNOSIS — J4541 Moderate persistent asthma with (acute) exacerbation: Secondary | ICD-10-CM | POA: Diagnosis not present

## 2015-07-15 ENCOUNTER — Other Ambulatory Visit: Payer: Self-pay

## 2015-07-15 ENCOUNTER — Other Ambulatory Visit: Payer: Commercial Managed Care - HMO

## 2015-07-15 DIAGNOSIS — I509 Heart failure, unspecified: Secondary | ICD-10-CM

## 2015-07-15 NOTE — Patient Outreach (Signed)
[place] [facility]) Care Management  07/15/2015  [name] 09/19/1945 [id number]   Telephone call today for admission assessment for Level II Disease Management.  However, patient reports needs beyond the scope of disease management.  She has had home health nursing and aide services in the past, but not recently.  States she is unable to get into the tub without assistance, and is unable to stand in the kitchen long enough to prepare a meal.  She ambulates with a walker in her apartment. She does receive Meals on Wheels for lunch, and states that her daughter brings her dinner to her.  Patient is O2 dependent.  She lives in a second story apartment and cannot get down the stairs with her O2 equipment and her walker without assistance.  She is on approximately 20 medications, but states her daughter fills a med box for her.    Plan:  Refer to community nurse due to environmental safety concerns.           Refer to SW to assess for available resources for personal care needs.  [name], RN, MSN [place] [phone] Fax [phone]

## 2015-07-15 NOTE — Patient Outreach (Signed)
[place]) Care Management  [place]  07/15/2015   SMAYA ZYLKA [birth year] [id number]   Telephone call for scheduled assessment.  No answer.  Message left requesting call back. Another attempt to reach patient will be made later today.  [name], RN, MSN [place] [phone] Fax [phone]

## 2015-07-15 NOTE — Patient Outreach (Signed)
[place] [facility]) Care Management  07/15/2015  [name] October 26, 1945 [id number]   Request from [name], RN to assign Community RN and SW, assigned [name], RN and [name], [name].  Thanks, [name]. [name], [name] Assistant Phone: [phone] Fax: [phone]

## 2015-07-16 DIAGNOSIS — N184 Chronic kidney disease, stage 4 (severe): Secondary | ICD-10-CM | POA: Diagnosis not present

## 2015-07-16 DIAGNOSIS — E8770 Fluid overload, unspecified: Secondary | ICD-10-CM | POA: Diagnosis not present

## 2015-07-16 DIAGNOSIS — I5030 Unspecified diastolic (congestive) heart failure: Secondary | ICD-10-CM | POA: Diagnosis not present

## 2015-07-17 DIAGNOSIS — M1712 Unilateral primary osteoarthritis, left knee: Secondary | ICD-10-CM | POA: Diagnosis not present

## 2015-07-17 DIAGNOSIS — M25562 Pain in left knee: Secondary | ICD-10-CM | POA: Diagnosis not present

## 2015-07-19 ENCOUNTER — Other Ambulatory Visit: Payer: Self-pay | Admitting: *Deleted

## 2015-07-19 DIAGNOSIS — H35031 Hypertensive retinopathy, right eye: Secondary | ICD-10-CM | POA: Diagnosis not present

## 2015-07-19 DIAGNOSIS — H524 Presbyopia: Secondary | ICD-10-CM | POA: Diagnosis not present

## 2015-07-19 DIAGNOSIS — H353120 Nonexudative age-related macular degeneration, left eye, stage unspecified: Secondary | ICD-10-CM | POA: Diagnosis not present

## 2015-07-19 DIAGNOSIS — H35032 Hypertensive retinopathy, left eye: Secondary | ICD-10-CM | POA: Diagnosis not present

## 2015-07-19 DIAGNOSIS — H353110 Nonexudative age-related macular degeneration, right eye, stage unspecified: Secondary | ICD-10-CM | POA: Diagnosis not present

## 2015-07-19 DIAGNOSIS — H25012 Cortical age-related cataract, left eye: Secondary | ICD-10-CM | POA: Diagnosis not present

## 2015-07-19 DIAGNOSIS — G4733 Obstructive sleep apnea (adult) (pediatric): Secondary | ICD-10-CM | POA: Diagnosis not present

## 2015-07-19 NOTE — Patient Outreach (Signed)
[place] [facility]) Care Management  07/19/2015  [name] 02-03-1946 [id number]  Phone call to patient who confirms that she has been approved for full medicaid and would like to be referred for in home assistance.  Per patient, she would benefit from more help in the home.  This Education officer, museum explained the process and that patient's doctor would have to sign the request form for an independent assessment through [facility].   Plan:  This social worker will fax request form for an independent assessment to patient's doctor for completion.    Sheralyn Boatman [facility] Care Management [phone]

## 2015-07-21 DIAGNOSIS — G4733 Obstructive sleep apnea (adult) (pediatric): Secondary | ICD-10-CM | POA: Diagnosis not present

## 2015-07-22 ENCOUNTER — Other Ambulatory Visit (HOSPITAL_COMMUNITY): Payer: Self-pay | Admitting: Internal Medicine

## 2015-07-22 LAB — CUP PACEART REMOTE DEVICE CHECK
Battery Remaining Longevity: 118 mo
Battery Remaining Percentage: 95.5 %
Battery Voltage: 3.01 V
Brady Statistic AP VP Percent: 1 %
Brady Statistic AP VS Percent: 96 %
Brady Statistic AS VP Percent: 1 %
Brady Statistic AS VS Percent: 4 %
Brady Statistic RA Percent Paced: 96 %
Brady Statistic RV Percent Paced: 1 %
Date Time Interrogation Session: 20161102075847
Implantable Lead Implant Date: 20151015
Implantable Lead Implant Date: 20151015
Implantable Lead Location: 753859
Implantable Lead Location: 753860
Implantable Lead Model: 1948
Lead Channel Impedance Value: 510 Ohm
Lead Channel Impedance Value: 590 Ohm
Lead Channel Pacing Threshold Amplitude: 0.875 V
Lead Channel Pacing Threshold Amplitude: 1.25 V
Lead Channel Pacing Threshold Pulse Width: 0.4 ms
Lead Channel Pacing Threshold Pulse Width: 0.5 ms
Lead Channel Sensing Intrinsic Amplitude: 12 mV
Lead Channel Sensing Intrinsic Amplitude: 5 mV
Lead Channel Setting Pacing Amplitude: 1.5 V
Lead Channel Setting Pacing Amplitude: 1.875
Lead Channel Setting Pacing Pulse Width: 0.5 ms
Lead Channel Setting Sensing Sensitivity: 2 mV
Pulse Gen Model: 2240
Pulse Gen Serial Number: 7665001

## 2015-07-23 ENCOUNTER — Other Ambulatory Visit: Payer: Self-pay | Admitting: *Deleted

## 2015-07-23 ENCOUNTER — Encounter: Payer: Self-pay | Admitting: *Deleted

## 2015-07-23 ENCOUNTER — Encounter: Payer: Self-pay | Admitting: Cardiology

## 2015-07-23 NOTE — Patient Outreach (Signed)
[facility] ([facility]) Care Management  07/23/2015  [name] 07/10/1946 [id number]  Assessment: Care coordination call Referral back from Health Coach (C. Rankin) for home safety evaluation. Patient was a [facility] client in the past with goals met, but is referred again from the [employer] tier 4 list. She has had home health nursing and aide services in the past, but not recently. She ambulates with a walker in her apartment. Patient reports unable to stand in the kitchen long enough to prepare a meal. She does receive Meals on Wheels for lunch, and states that her daughter brings in her dinner.  Patient is O2 dependent. She lives on the second floor of an apartment with her daughters.  States that she cannot get down the stairs with her O2 equipment and her walker without assistance.Patient states using cane to go up and down the stairs and holding on the railings with assist. Care management coordinator asked patient if she has not thought of moving to the first floor and patient reports it had been discussed with her daughters before but not able since they were told "they did not take Section 8" on the first floor. Patient verbalized it was too much of a hassle to move to another apartment considering her condition ("moving is not easy") and the "moving process for Section 8 is too much". Patient shares being able to manage to live there for 16 years with no fall episode. Patient states she feels safe there. Besides, she only needs to go down the stairs when going to her doctor's appointment or very important matters, as stated.    She is on approximately 20 medications, but states her daughter manages and fills a medication box for her.   Patient shares that having a rollator walker will be helpful for her which she can keep in the car that is ready to be used when she goes down the stairs with a cane. Besides, this is helpful when she waits in line since she can not stand long enough  to do anything, per patient. Patient was notified that she will need an order or prescription from a doctor to obtain a rollator walker. Patient expressed understanding and agreed to call her doctor's office to inquire about it.  Patient verbalized no other needs or concerns at this time. She agreed to a home visit to do  home safety evaluation. She is aware to call [facility], care management coordinator or 24-hour nurse line if necessary.   Plan:Home visit on 08/04/15  Lorraine A. Ajel, BSN, RN-BC [facility] Community Care Management Coordinator Cell: [phone]    

## 2015-07-27 DIAGNOSIS — H2512 Age-related nuclear cataract, left eye: Secondary | ICD-10-CM | POA: Diagnosis not present

## 2015-07-27 DIAGNOSIS — H2511 Age-related nuclear cataract, right eye: Secondary | ICD-10-CM | POA: Diagnosis not present

## 2015-07-29 DIAGNOSIS — I509 Heart failure, unspecified: Secondary | ICD-10-CM | POA: Diagnosis not present

## 2015-07-29 DIAGNOSIS — R1314 Dysphagia, pharyngoesophageal phase: Secondary | ICD-10-CM | POA: Diagnosis not present

## 2015-07-29 DIAGNOSIS — I10 Essential (primary) hypertension: Secondary | ICD-10-CM | POA: Diagnosis not present

## 2015-07-29 DIAGNOSIS — N184 Chronic kidney disease, stage 4 (severe): Secondary | ICD-10-CM | POA: Diagnosis not present

## 2015-07-30 ENCOUNTER — Other Ambulatory Visit (HOSPITAL_COMMUNITY): Payer: Self-pay | Admitting: Internal Medicine

## 2015-08-01 IMAGING — CR DG CHEST 2V
2 series · 2 of 2 positions shown · non-contrast
Comparison: June 11, 2014

CLINICAL DATA: Midsternal chest pain with difficulty breathing

EXAM:
CHEST  2 VIEW

[w chest pa]
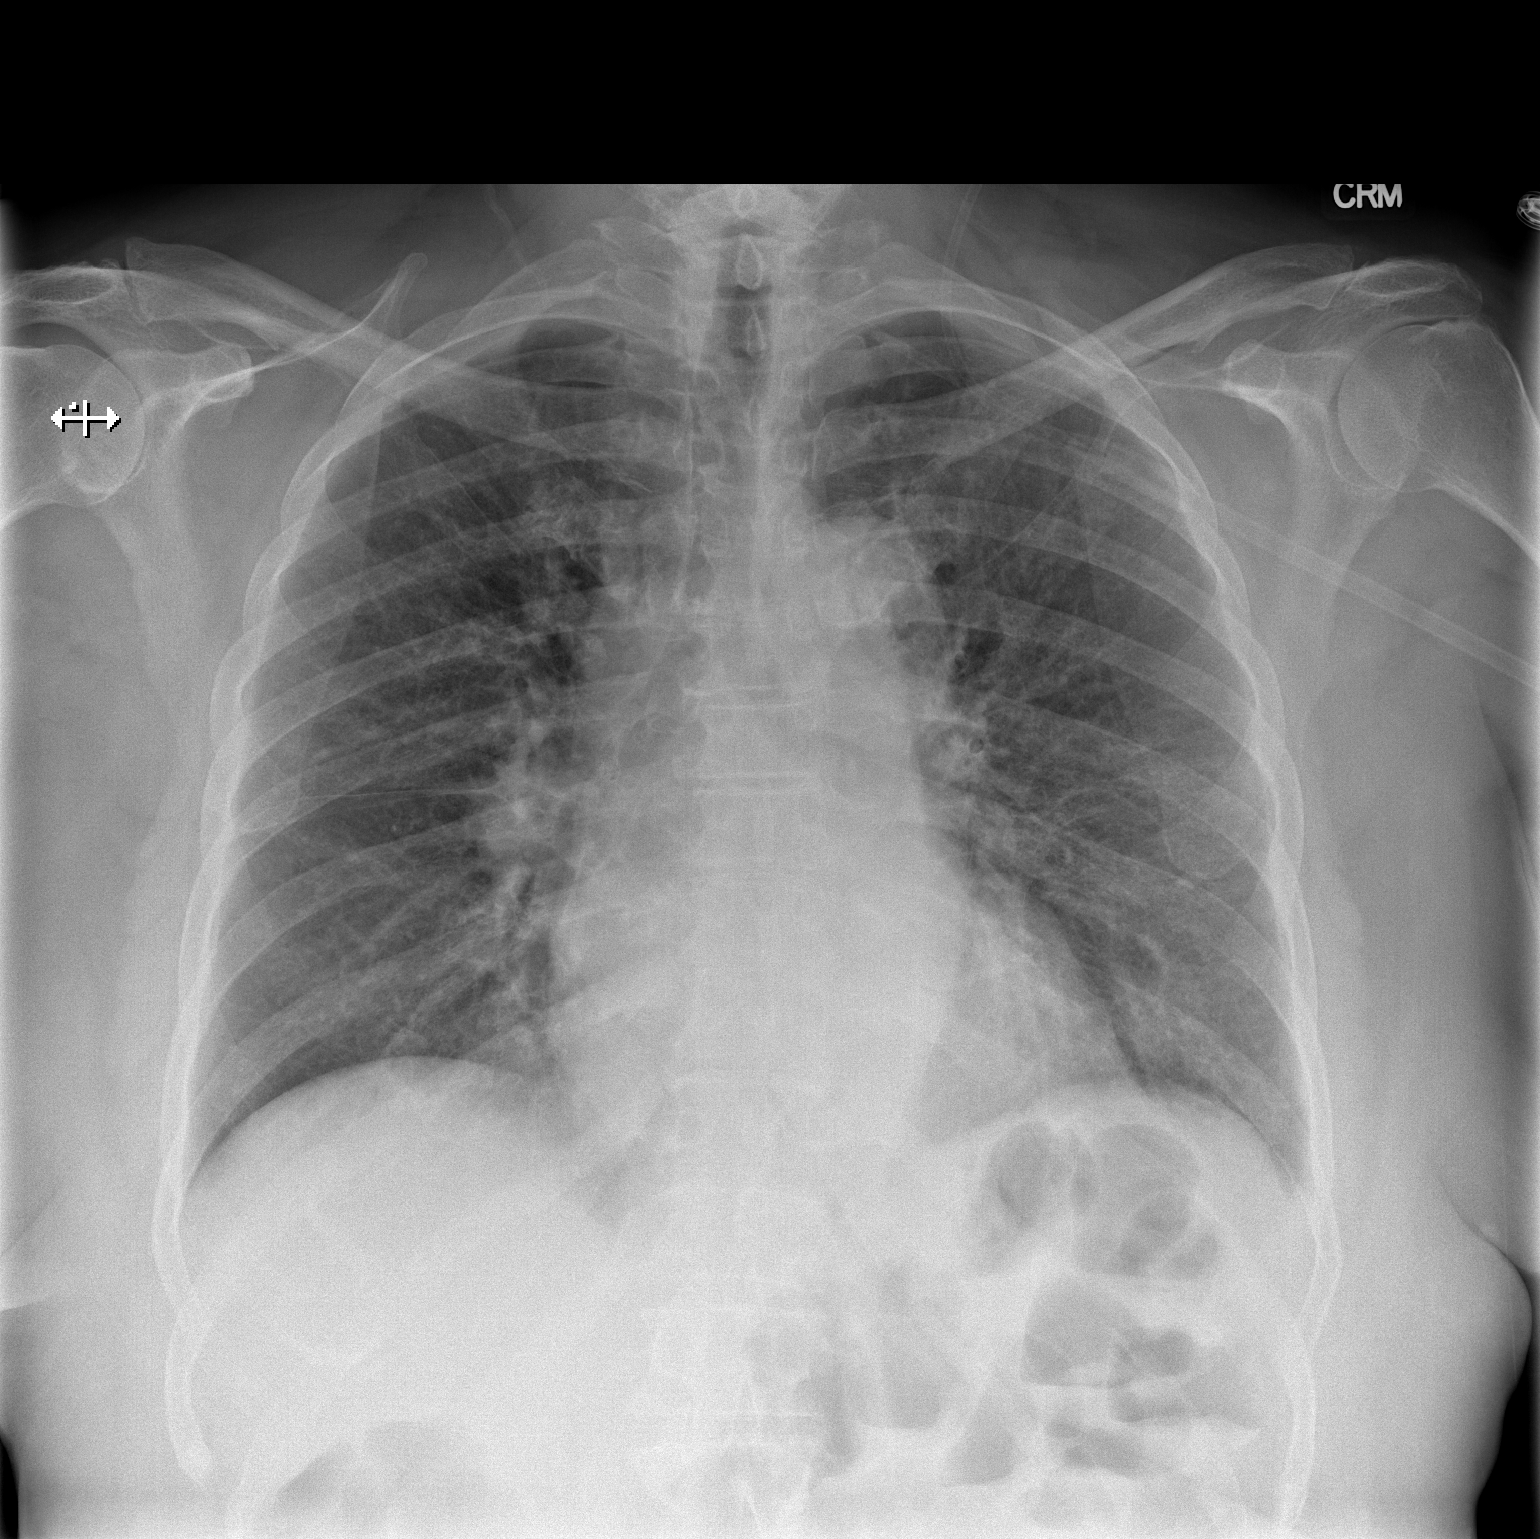

[w chest lat]
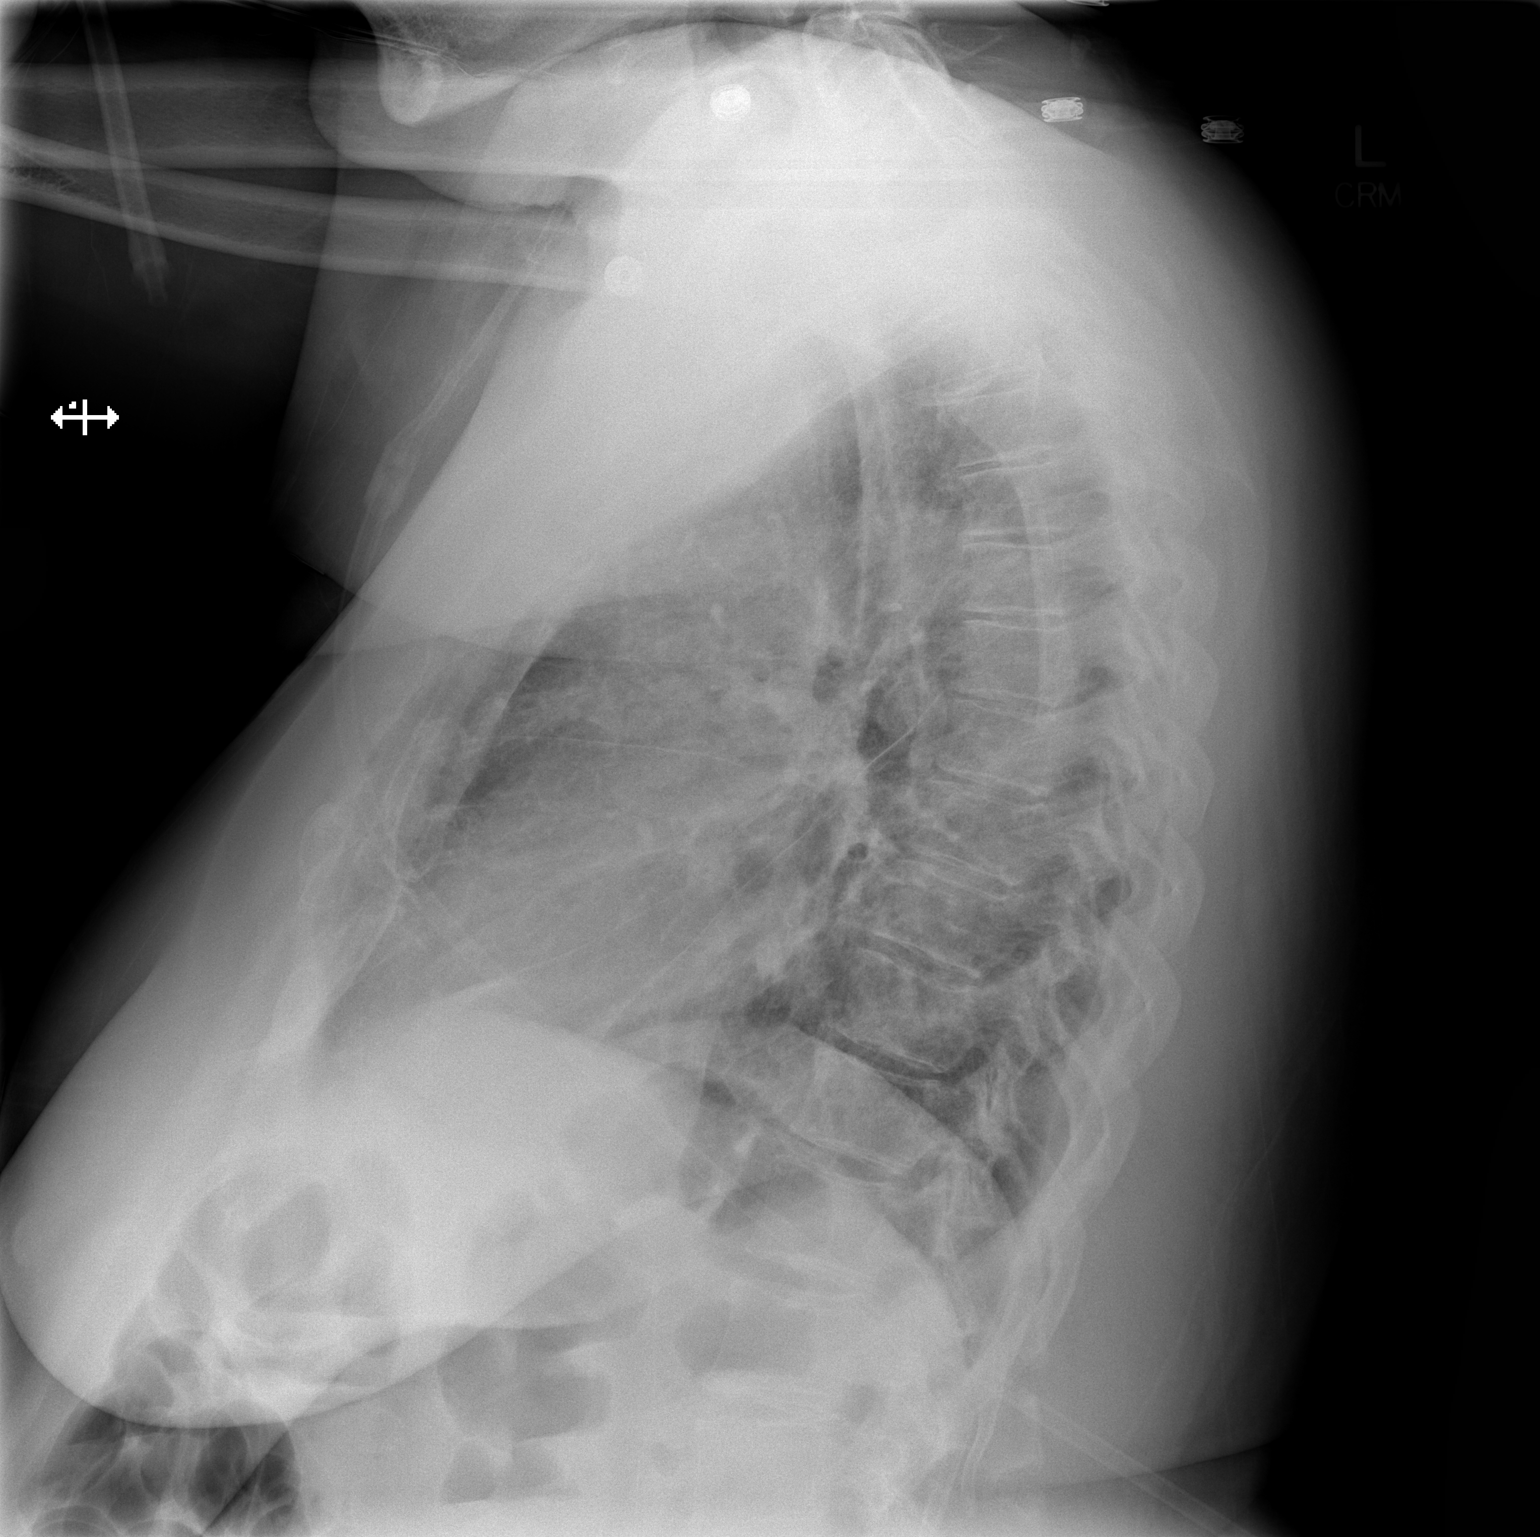

[2 of 2 positions shown; findings below may reference images not displayed]

FINDINGS: There is no edema or consolidation. Heart is upper normal in size
with pulmonary vascularity within normal limits. No adenopathy. No
bone lesions.
IMPRESSION: No edema or consolidation.

## 2015-08-03 ENCOUNTER — Other Ambulatory Visit: Payer: Self-pay | Admitting: *Deleted

## 2015-08-03 DIAGNOSIS — G4733 Obstructive sleep apnea (adult) (pediatric): Secondary | ICD-10-CM | POA: Diagnosis not present

## 2015-08-03 DIAGNOSIS — I509 Heart failure, unspecified: Secondary | ICD-10-CM | POA: Diagnosis not present

## 2015-08-03 DIAGNOSIS — J849 Interstitial pulmonary disease, unspecified: Secondary | ICD-10-CM | POA: Diagnosis not present

## 2015-08-03 DIAGNOSIS — J9621 Acute and chronic respiratory failure with hypoxia: Secondary | ICD-10-CM | POA: Diagnosis not present

## 2015-08-03 NOTE — Patient Outreach (Signed)
[place] [facility]) Care Management  08/03/2015  [name] February 02, 1946 [id number]   Phone call to patient's providers office-[facility] to request completion of independent assessment form  For personal care services.  Request form faxed today to  [phone].    Sheralyn Boatman [facility] Care Management [phone]

## 2015-08-04 ENCOUNTER — Encounter: Payer: Self-pay | Admitting: *Deleted

## 2015-08-04 ENCOUNTER — Other Ambulatory Visit: Payer: Self-pay | Admitting: *Deleted

## 2015-08-04 IMAGING — CR DG CHEST 2V
2 series · 2 of 2 positions shown · non-contrast
Comparison: June 16, 2014

CLINICAL DATA: Cardiac arrhythmia with pacemaker placement

EXAM:
CHEST  2 VIEW

[w chest pa]
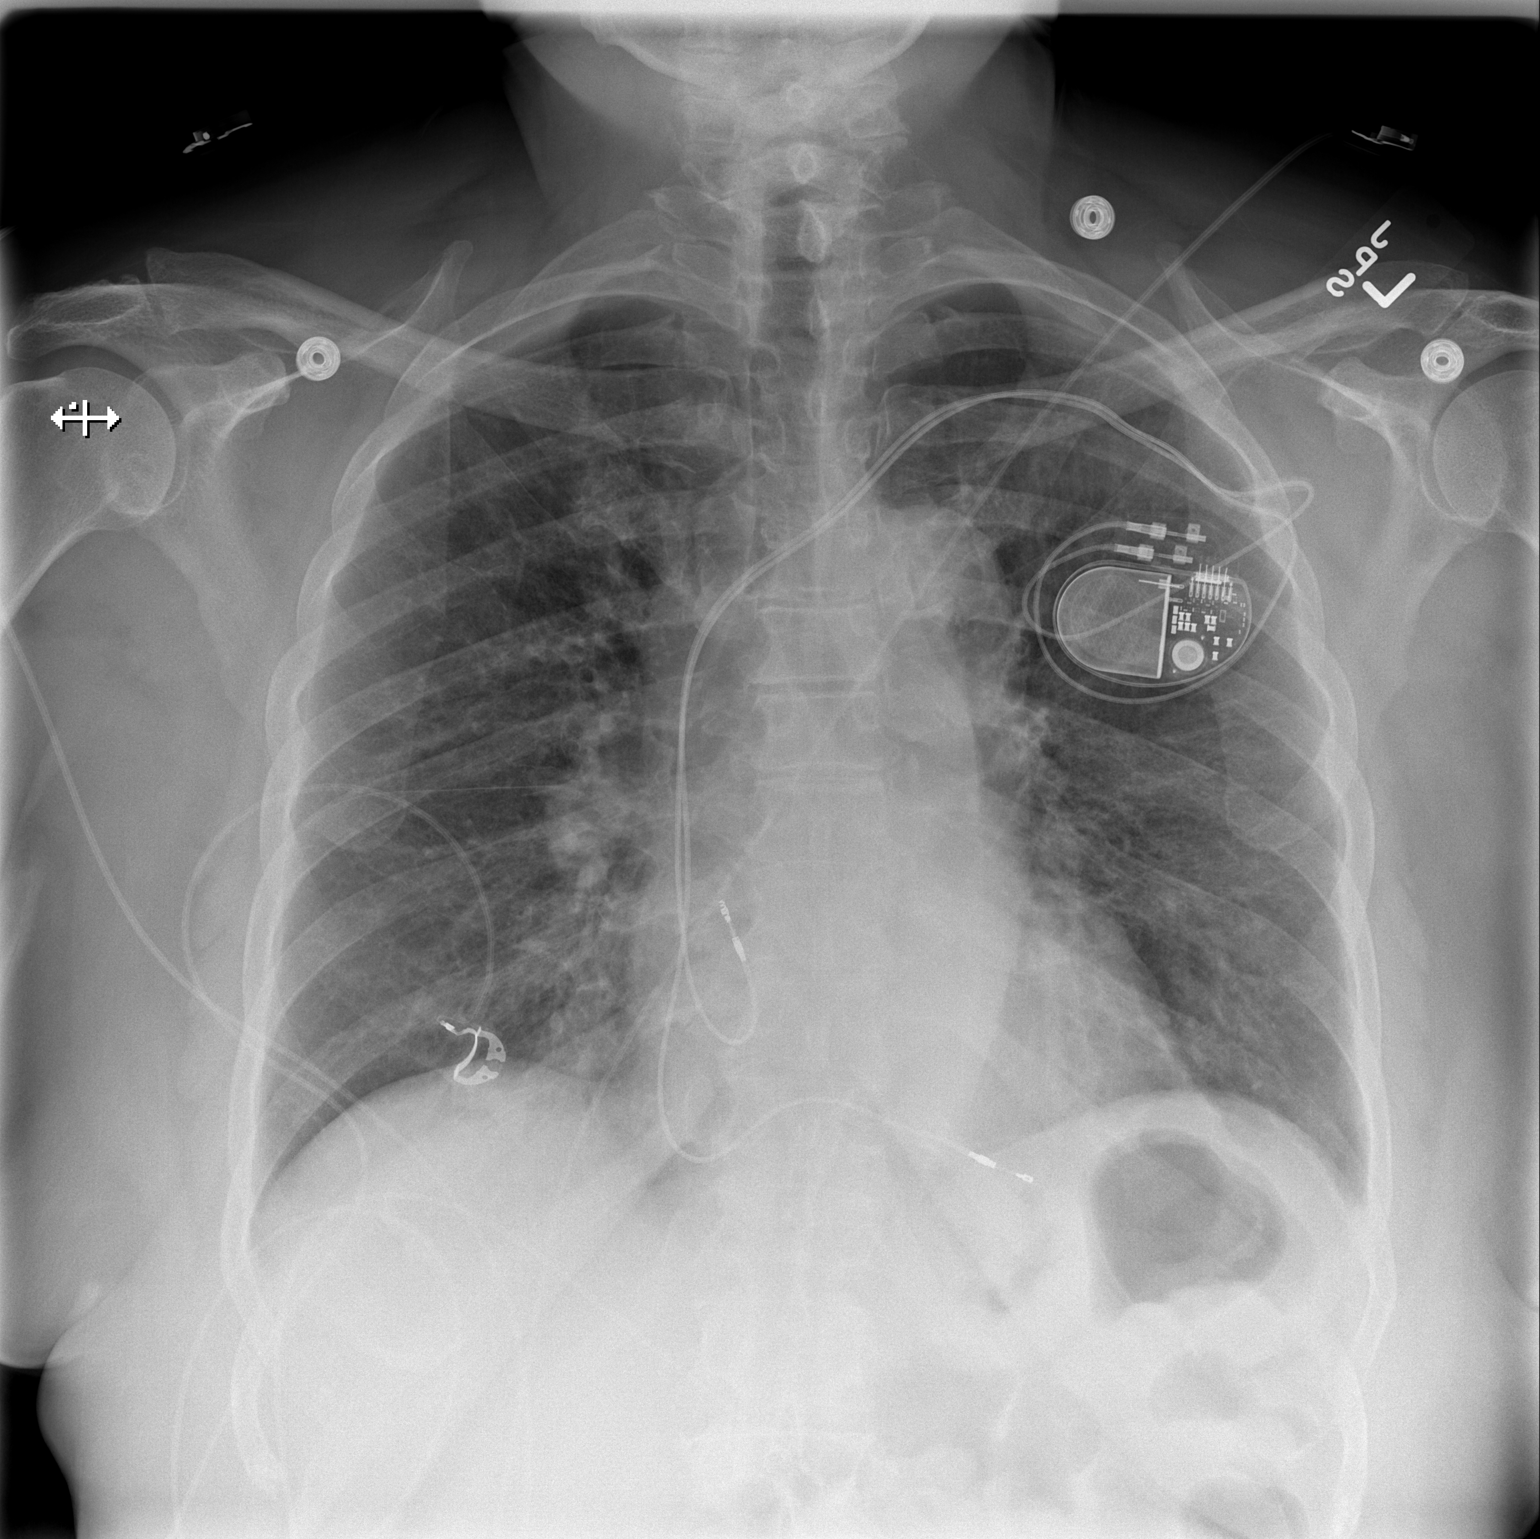

[w chest lat]
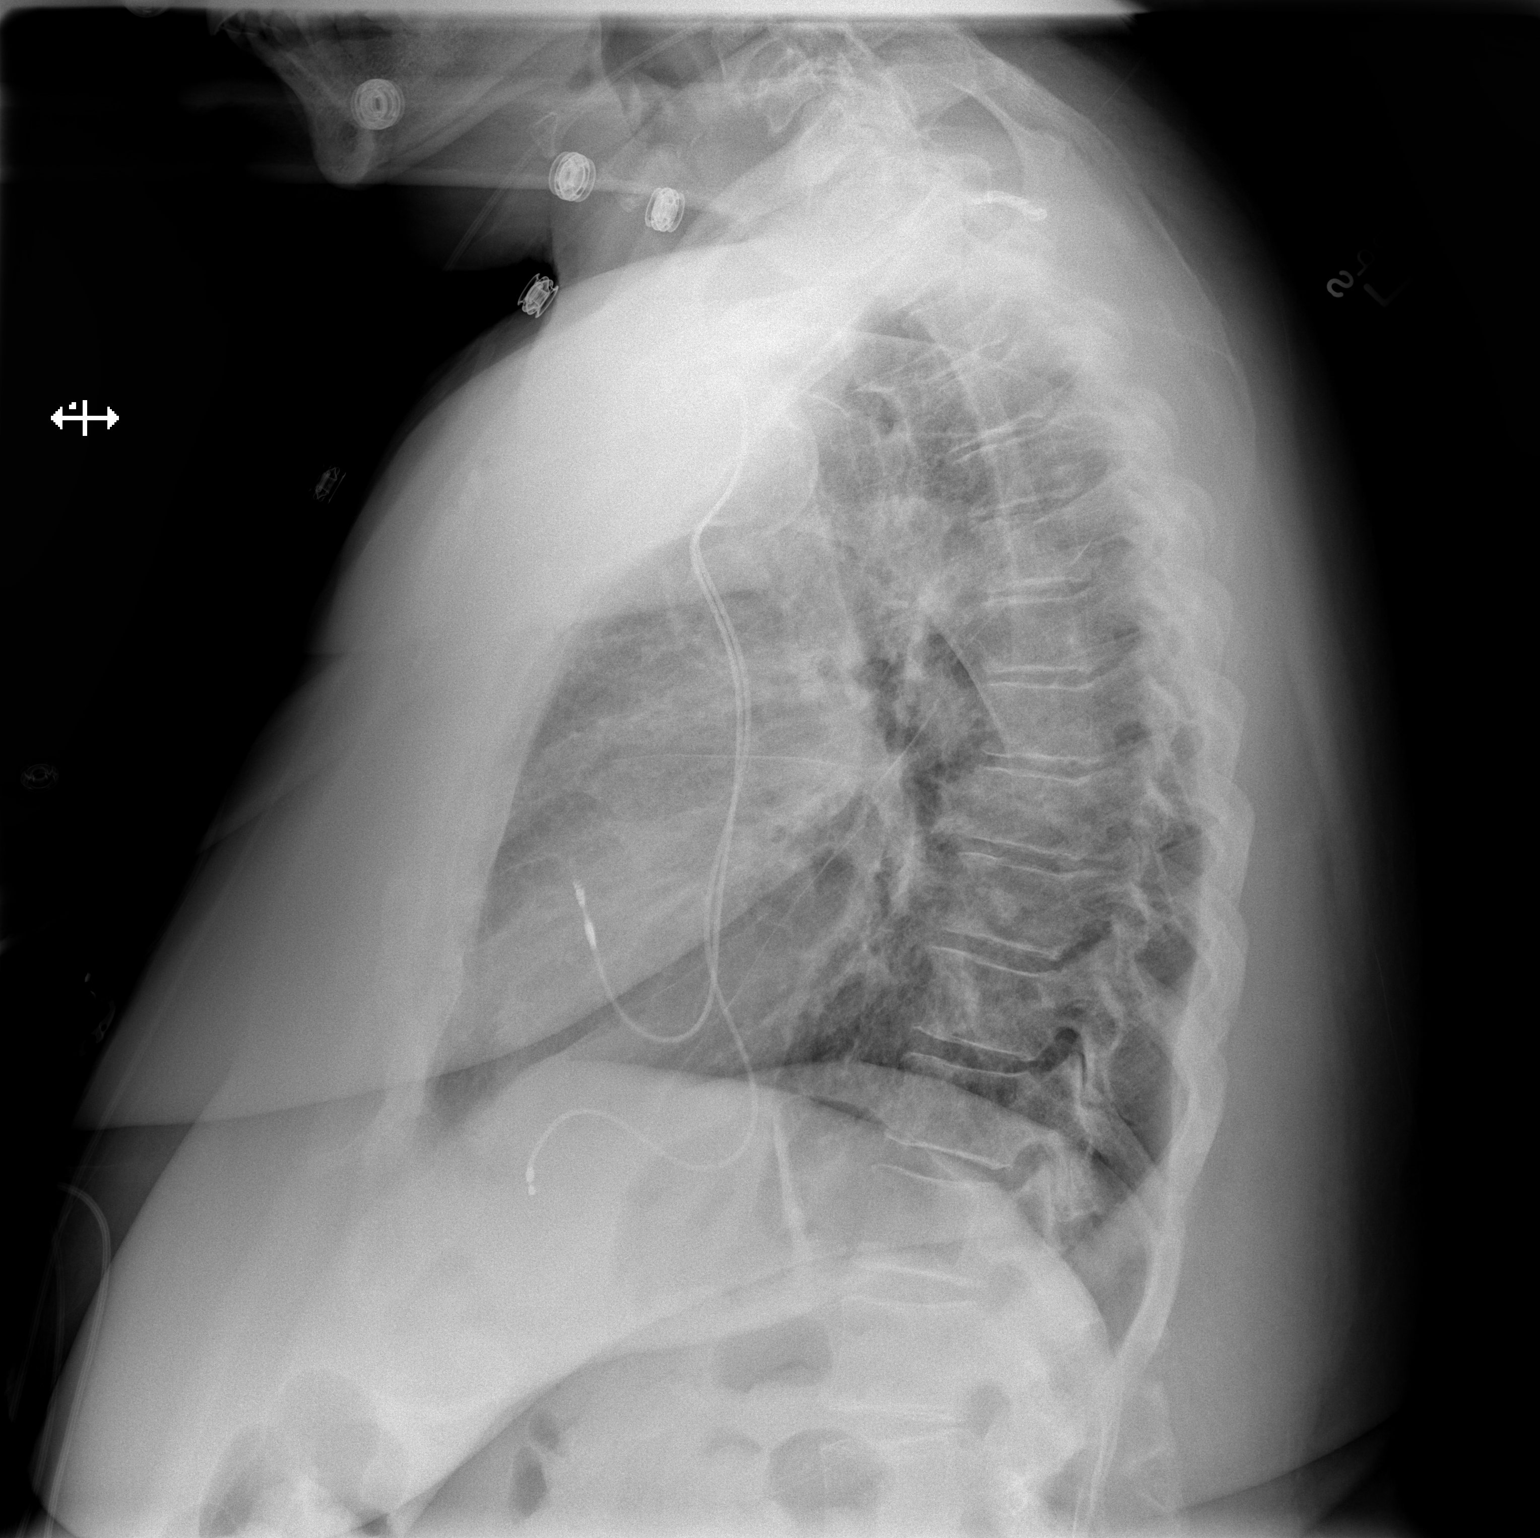

[2 of 2 positions shown; findings below may reference images not displayed]

FINDINGS: There is no a pacemaker on the left. Lead tips are attached to the
right atrium and right ventricle. There is no demonstrable
pneumothorax. There is no edema or consolidation. The heart size and
pulmonary vascularity are normal. No adenopathy. No bone lesions.
IMPRESSION: Pacemaker now present with lead tips attached to the right atrium
and right ventricle. No edema or consolidation. No apparent
pneumothorax.

## 2015-08-04 NOTE — Patient Outreach (Signed)
[facility]) Care Management   08/04/2015  [name] [birth year] [id number]  [name] is an 69 y.o. female  Subjective: Patient states "doing ok". She denies any pain. Patient reports that she can be forgetful at times.  Objective:  BP 136/80 mmHg  Pulse 61  Resp 16  SpO2 96%  Review of Systems  Constitutional: Negative.   Eyes:       Had right eye cataract surgery on 11/22   Respiratory: Positive for cough. Negative for shortness of breath and wheezing.        Uses oxygen and CPAP at nights Occasional cough with clear mucus per patient  Cardiovascular: Negative for chest pain.       Regular, rate and rhythm Trace edema to left ankle  Gastrointestinal: Positive for constipation. Negative for nausea, vomiting and abdominal pain.       Obese, abdomen soft and non-tender Bowel sounds present  Genitourinary: Negative.   Musculoskeletal: Negative for joint pain and falls.       History of right knee surgery Left knee needs surgery- but patient refused  Skin: Negative.   Neurological: Negative.   Endo/Heme/Allergies: Negative.   Psychiatric/Behavioral: Positive for memory loss.    Physical Exam   Current Medications:    albuterol (PROVENTIL) (2.5 MG/3ML) 0.083% nebulizer solution 2.5 mg, Every 6 hours PRN  Note: Received from: External Pharmacy Received Sig: (Written 10/05/2014 1431)  amiodarone (PACERONE) 200 MG tablet 200 mg, Daily amitriptyline (ELAVIL) 25 MG tablet 25 mg, Daily at bedtime atorvastatin (LIPITOR) 40 MG tablet dicyclomine (BENTYL) 10 MG capsule 10 mg, Daily diltiazem (CARDIZEM CD) 180 MG 24 hr capsule 180 mg, Daily ELIQUIS 5 MG TABS tablet esomeprazole (NEXIUM) 40 MG capsule 40 mg, Daily febuxostat (ULORIC) 40 MG tablet 40 mg, Daily hydrALAZINE (APRESOLINE) 25 MG tablet HYDROcodone-acetaminophen (NORCO/VICODIN) 5-325 MG per tablet 1 tablet, Every 4 hours PRN  Note: Received from: External Pharmacy Received Sig: (Written 05/07/2015  1432)  insulin glargine (LANTUS) 100 UNIT/ML injection 10 Units, Daily at bedtime  Note: Takes 50 units injection daily per patient (D. Balan) (Written 07/23/2015 1629)  insulin lispro (HUMALOG) 100 UNIT/ML injection 20 Units, 3 times daily after meals  Note: Taking 20 units injection after meals per patient (Written 07/23/2015 1630)   Patient taking differently: 50 Units Subcutaneous BH-each morning, Indications: Type 2 Diabetes Mellitus, Informant: Self, Reported on 10/05/2014  levothyroxine (SYNTHROID, LEVOTHROID) 25 MCG tablet 1 tablet, Daily  Note: Received from: External Pharmacy Received Sig: (Written 05/07/2015 1432)  Linaclotide (LINZESS) 145 MCG CAPS capsule 290 mcg, Daily metoprolol tartrate (LOPRESSOR) 25 MG tablet metoprolol tartrate (LOPRESSOR) 25 MG tablet  Note: duplicate (Written 90/30/0923 1632)   Patient not taking. Reported on 07/23/2015  nitroGLYCERIN (NITROSTAT) 0.4 MG SL tablet 0.4 mg, Every 5 min PRN OXYGEN 3 L/min, Continuous  Note: Uses oxygen every night (Written 08/04/2015 1129)  polyethylene glycol (MIRALAX / GLYCOLAX) packet 17 g, Daily  Note: Taking as needed (Written 07/23/2015 1634)  potassium chloride SA (K-DUR,KLOR-CON) 20 MEQ tablet ranitidine (ZANTAC) 150 MG tablet  Patient taking differently: TAKE ONE TABLET BY MOUTH AT BEDTIME (PER DOCTOR MCLEAN) ADDITIONAL REFILL SHOULD COME FROM PCP, Reported on 08/04/2015  torsemide (DEMADEX) 20 MG tablet 40 mg, 2 times daily  Note: Taking 100 mg in the morning and 100 mg in the evening per patient (Dr. Florene Glen) (Written 07/23/2015 1636)   Patient taking differently: (No dose reported), (No route reported), (No frequency reported), Take 3 tablets by mouth twice  a day, Informant: Self, Reported on 05/07/2015  Vitamin D, Ergocalciferol, (DRISDOL) 50000 UNITS CAPS capsule 50,000 Units, Every Mon XOPENEX HFA 45 MCG/ACT inhaler 2 puff, Every 4 hours PRN zolpidem (AMBIEN) 10 MG tablet 10 mg, Daily at bedtime      Functional  Status:   In your present state of health, do you have any difficulty performing the following activities: 08/04/2015 07/15/2015  Hearing? - N  Vision? - N  Difficulty concentrating or making decisions? - N  Walking or climbing stairs? - Y  Dressing or bathing? - Y  Doing errands, shopping? - Y  Preparing Food and eating ? N -  Using the Toilet? N -  In the past six months, have you accidently leaked urine? N -  Do you have problems with loss of bowel control? N -  Managing your Medications? Y -  Managing your Finances? N -  Housekeeping or managing your Housekeeping? Y -    Fall/Depression Screening:    PHQ 2/9 Scores 07/02/2015 01/22/2015 12/25/2014 12/02/2014  PHQ - 2 Score 0 0 0 0    Assessment:   69 year old female, obese, was a referral from [place] (C. Rankin) for home safety evaluation. Patient was previously a [facility] client for almost 3 months with goals met, but was referred back from the [facility] tier 4 list. Arrived at patient's home. She lives on the second level of an apartment complex. Patient is the only one present during the visit since her daughter Demetrius Charity) is at work, but she called to check on patient during the visit. Patient is able to walk around in her apartment without any assistive devices. She reports that she had steroid injection on her left knee recently that enables her to walk. Patient states she has history of right knee surgery and is suppose to have left knee done as well but she refused due to complications encountered during her right knee surgery.   She has had home health nursing and aide services in the past, but not recently.Patient reports unable to stand in the kitchen long enough to prepare a meal or do tasks like cleaning the sink or washing the dishes.  Patient receives Meals on Wheels for lunch, and reports that her daughter brings in her dinner.  Noted a rollator walker in the living room and patient reports that her orthopedic  doctor provided a prescription for it and her daughter picked it up yesterday from [place] care. Per patient, she has started using it and was helpful especially with the kitchen tasks. Patient has a clear path to walk through and a clear hallway. Floor is free of small throw rugs. She is wearing a rubber sole footwear, no extension cords or electrical cords noted on her walk path and she has adequate lighting.  Patient also has a reacher, grab bar in her tub, has raised toilet seat, bedside commode by her bed and a shower chair. Patient was provided a home safety and fall prevention brochure and was discussed with her. Ms. Metheny also reported that she had cataract surgery done on her right eye on 07/27/15 and has scheduled cataract surgery to her left eye on 08/16/15.  Patient states that she cannot get down the stairs with her oxygen equipment and her walker without assistance.Patient reports using cane to go up and down the stairs and holding on the railings with assist.  Patient is oxygen dependent and has a portable oxygen in a bag that she carries  when she goes out. She also has an oxygen sign on her door. She mentioned in our conversation that she drove herself to her eye doctor's follow-up appointment yesterday (since her daughter was not off of work) and drove herself to church last Sunday but son and grandson have to help her on the stairs.  Patient verbalized refusal to move to the first floor and states "moving is not that easy as you thought". She states she is already "use to living here" and "able to manage it for 16 years without a fall". Patient further states she feels safe here.   Patient expressed understanding and reports being comfortable managing her diseases with her children's help.  She has her Heart Failure and COPD magnets on her refrigerator that serve as her guide on what to monitor and when to call the doctor for help.  She was also provided a Carb Counting food  guide to help with her food choices. She reports monitoring her blood sugar 3-4 times a day and aware on how to manage low/high blood sugar.  Patient denies any other health needs or issues at this time and she agreed to case closure from community care management coordinator with goal met, however, [facility] social worker still continues with the case. She is aware to call [facility], care management coordinator or 24-hour nurse line if necessary.     Plan: Will close community care management coordinator's case and notify [facility] social worker assigned (C. [place]). [place] social work will continue with patient's case.   [facility] CM Care Plan Problem Two        Most Recent Value   Care Plan Problem Two  unable to stand-up long enough to do a task   Role Documenting the Problem Two  Care Management [place] for Problem Two  Active   [facility] CM Short Term Goal #1 (0-30 days)  patient will be able to obtain walker with a seat (rollator) to support her in doing her tasks within the next 30 days   [facility] CM Short Term Goal #1 Start Date  07/23/15   [facility] CM Short Term Goal #1 Met Date   08/04/15 [Goal met- rollator walker obtained yesterday]   Interventions for Short Term Goal #2   encourage patient to call and inquire from provider's office on how to obtain a rollator walker,  mentioned to patient to try and request for doctor's order or prescription for a rollator walker,   encouraged her to get assistance from daughters to obtain the walker with a seat that she needs      Edwena Felty A. Adlai Sinning, BSN, RN-BC [place] Management Coordinator Cell: [phone]

## 2015-08-06 ENCOUNTER — Other Ambulatory Visit (HOSPITAL_COMMUNITY): Payer: Self-pay | Admitting: Internal Medicine

## 2015-08-06 ENCOUNTER — Encounter: Payer: Self-pay | Admitting: Cardiology

## 2015-08-13 ENCOUNTER — Other Ambulatory Visit (HOSPITAL_COMMUNITY): Payer: Self-pay | Admitting: Cardiology

## 2015-08-14 ENCOUNTER — Other Ambulatory Visit (HOSPITAL_COMMUNITY): Payer: Self-pay | Admitting: Cardiology

## 2015-08-16 DIAGNOSIS — H2512 Age-related nuclear cataract, left eye: Secondary | ICD-10-CM | POA: Diagnosis not present

## 2015-08-16 DIAGNOSIS — H25012 Cortical age-related cataract, left eye: Secondary | ICD-10-CM | POA: Diagnosis not present

## 2015-08-18 DIAGNOSIS — N184 Chronic kidney disease, stage 4 (severe): Secondary | ICD-10-CM | POA: Diagnosis not present

## 2015-08-18 DIAGNOSIS — I5030 Unspecified diastolic (congestive) heart failure: Secondary | ICD-10-CM | POA: Diagnosis not present

## 2015-08-18 DIAGNOSIS — G4733 Obstructive sleep apnea (adult) (pediatric): Secondary | ICD-10-CM | POA: Diagnosis not present

## 2015-08-18 DIAGNOSIS — E8770 Fluid overload, unspecified: Secondary | ICD-10-CM | POA: Diagnosis not present

## 2015-08-18 DIAGNOSIS — E119 Type 2 diabetes mellitus without complications: Secondary | ICD-10-CM | POA: Diagnosis not present

## 2015-08-19 ENCOUNTER — Other Ambulatory Visit: Payer: Self-pay | Admitting: *Deleted

## 2015-08-19 NOTE — Patient Outreach (Addendum)
[name]) Care Management  08/19/2015  [name] August 09, 1946 [id number]   This social worker received corrected request form for independent assessment for personal care services from patient's provider on 08/18/15.  Corrected request form faxed today.  Patient informed that the request was re-submitted to [facility] today.   Plan:  This social worker will follow up with patient regarding personal care services in approximately 3 weeks.

## 2015-08-20 DIAGNOSIS — E1040 Type 1 diabetes mellitus with diabetic neuropathy, unspecified: Secondary | ICD-10-CM | POA: Diagnosis not present

## 2015-08-20 DIAGNOSIS — M1712 Unilateral primary osteoarthritis, left knee: Secondary | ICD-10-CM | POA: Diagnosis not present

## 2015-08-23 ENCOUNTER — Other Ambulatory Visit: Payer: Self-pay

## 2015-08-23 DIAGNOSIS — N184 Chronic kidney disease, stage 4 (severe): Secondary | ICD-10-CM

## 2015-08-23 DIAGNOSIS — Z01810 Encounter for preprocedural cardiovascular examination: Secondary | ICD-10-CM

## 2015-08-24 DIAGNOSIS — H2512 Age-related nuclear cataract, left eye: Secondary | ICD-10-CM | POA: Diagnosis not present

## 2015-08-25 ENCOUNTER — Other Ambulatory Visit: Payer: Self-pay | Admitting: *Deleted

## 2015-08-25 NOTE — Patient Outreach (Signed)
[facility]) Care Management  08/25/2015  EMYLEE BOLTER [birth year] [id number]   Phone call to patient to follow up on personal care services being arranged.  Per patient, she has a scheduled independent assessment for personal care services on 09/02/15 at 8:00am.   Plan:  This Education officer, museum will follow up with patient regarding results of the independent assessment.

## 2015-08-27 DIAGNOSIS — M1712 Unilateral primary osteoarthritis, left knee: Secondary | ICD-10-CM | POA: Diagnosis not present

## 2015-08-28 DIAGNOSIS — R1314 Dysphagia, pharyngoesophageal phase: Secondary | ICD-10-CM | POA: Diagnosis not present

## 2015-08-28 DIAGNOSIS — I509 Heart failure, unspecified: Secondary | ICD-10-CM | POA: Diagnosis not present

## 2015-08-28 DIAGNOSIS — N184 Chronic kidney disease, stage 4 (severe): Secondary | ICD-10-CM | POA: Diagnosis not present

## 2015-08-28 DIAGNOSIS — I10 Essential (primary) hypertension: Secondary | ICD-10-CM | POA: Diagnosis not present

## 2015-09-03 ENCOUNTER — Other Ambulatory Visit (HOSPITAL_COMMUNITY): Payer: Self-pay | Admitting: Internal Medicine

## 2015-09-04 ENCOUNTER — Other Ambulatory Visit (HOSPITAL_COMMUNITY): Payer: Self-pay | Admitting: Internal Medicine

## 2015-09-04 DIAGNOSIS — M1712 Unilateral primary osteoarthritis, left knee: Secondary | ICD-10-CM | POA: Diagnosis not present

## 2015-09-07 ENCOUNTER — Other Ambulatory Visit (HOSPITAL_COMMUNITY): Payer: Self-pay | Admitting: *Deleted

## 2015-09-07 MED ORDER — APIXABAN 5 MG PO TABS: 5.0000 mg | ORAL_TABLET | Freq: Two times a day (BID) | ORAL | Status: DC

## 2015-09-08 ENCOUNTER — Other Ambulatory Visit (HOSPITAL_COMMUNITY): Payer: Self-pay | Admitting: *Deleted

## 2015-09-09 ENCOUNTER — Telehealth: Payer: Self-pay | Admitting: Cardiology

## 2015-09-09 ENCOUNTER — Other Ambulatory Visit: Payer: Self-pay | Admitting: *Deleted

## 2015-09-09 NOTE — Patient Outreach (Signed)
[facility]) Care Management  09/09/2015  [name] [birth year] [id number]   Phone call to patient to follow up on referral for personal care services. Per patient, she had her assessment done and was approved for personal care services.  [facility] was her agency of choice for this service.  She has not received a start date yet however.  Patient further stated difficulty getting her Eliquis re-filled, has not had it filled in 3 weeks,  and is currently working with her Cardiologist and pharmacy to get the documentation needed to get the medication re-filled.   [place] contacted regarding patient's Eliquis, [phone].  The patient's doctor's office will have to contact patient's insurance company for pre-authorization.  This social worker contacted patient's Cardiologist office-Dr. Aundra Dubin [phone], spoke with Juliann Pulse who will forward the message to the person that completes the pre-authorizations in order for the medication to be re-filled.   Plan:  This Education officer, museum will follow up with this within 2 business days.   Sheralyn Boatman [facility] Care Management [phone]

## 2015-09-09 NOTE — Telephone Encounter (Signed)
Social Worker from [facility], calling for the pt, stating that the pt needs a prior auth for Eliquis 5 mg tablet. Please advise

## 2015-09-10 ENCOUNTER — Other Ambulatory Visit: Payer: Self-pay | Admitting: *Deleted

## 2015-09-10 ENCOUNTER — Telehealth: Payer: Self-pay

## 2015-09-10 NOTE — Telephone Encounter (Signed)
Prior auth for Eliquis 5 mg sent to [facility]. 

## 2015-09-10 NOTE — Telephone Encounter (Signed)
Spoke with Crystal from [facility]. She states patient has been without Eliquis for few weeks. I explained that we have samples for her till I can do a PA. Eliquis 5mg  2 boxes provided to patient.

## 2015-09-10 NOTE — Patient Outreach (Signed)
[facility]) Care Management  09/10/2015  [name] 03-Feb-1946 [id number]   Phone call to patient's cardiologist office, spoke with Jenean Lindau, LPN  who is working on patient's pre-authorization for Eliquis.  Per [name], patient will be able to pick up samples, at least a week's worth, today at the [address] office.  Phone call to patient to inform her that the pre-authorization was in progress and that she could pick up the samples today. Importance of picking up the samples, while pre-authorization was being completed emphasized.  Per patient, she will call her daughter to transport her to the doctor's office to pick up the samples.  She will also call to see if daughter would be able to pick up the medicine for her.  No appointment needed.   Plan:  This Education officer, museum will follow up with patient by 4:00pm  regarding medication pick up.     Sheralyn Boatman [facility] Care Management [phone]

## 2015-09-10 NOTE — Patient Outreach (Addendum)
[facility]) Care Management  09/10/2015  SA[place] [birth year] [id number]   Follow call to patient to ensure that she received the samples of Eliquis.  Per patient, she has received the samples. EPIC notes reviewed-prior authorization for Eliquis sent to [employer].  Plan:  This Education officer, museum to follow up with patient in 2 weeks.  If no further needs, patient's case to be closed.   Sheralyn Boatman [facility] Care Management [phone] .

## 2015-09-13 ENCOUNTER — Telehealth: Payer: Self-pay

## 2015-09-13 NOTE — Telephone Encounter (Signed)
Eluiquis approved through 09/03/2016. [place] [id number].

## 2015-09-17 ENCOUNTER — Encounter: Payer: Self-pay | Admitting: Vascular Surgery

## 2015-09-17 DIAGNOSIS — N184 Chronic kidney disease, stage 4 (severe): Secondary | ICD-10-CM | POA: Diagnosis not present

## 2015-09-21 ENCOUNTER — Other Ambulatory Visit: Payer: Self-pay | Admitting: *Deleted

## 2015-09-21 NOTE — Patient Outreach (Signed)
[facility]) Care Management  09/21/2015  ASHANNA PERSONETTE 1945-10-05 [id number]   Phone call from patient stating that she had not heard from [facility] regarding the start of his personal care services.  Phone call made to [facility] to follow up and was told that patient would need to choose another agency as [name], the one chosen previously declined her case.    Plan: Patient given phone number to [facility] to choose another in home aid agency.   Sheralyn Boatman [facility] Care Management [phone]

## 2015-09-24 ENCOUNTER — Ambulatory Visit (HOSPITAL_COMMUNITY)
Admission: RE | Admit: 2015-09-24 | Discharge: 2015-09-24 | Disposition: A | Discharge status: Home / Self Care | Payer: Medicare Other | Source: Ambulatory Visit | Attending: Vascular Surgery | Admitting: Vascular Surgery

## 2015-09-24 ENCOUNTER — Encounter: Payer: Self-pay | Admitting: Vascular Surgery

## 2015-09-24 ENCOUNTER — Ambulatory Visit (INDEPENDENT_AMBULATORY_CARE_PROVIDER_SITE_OTHER): Payer: Medicare Other | Admitting: Vascular Surgery

## 2015-09-24 ENCOUNTER — Ambulatory Visit (INDEPENDENT_AMBULATORY_CARE_PROVIDER_SITE_OTHER)
Admission: RE | Admit: 2015-09-24 | Discharge: 2015-09-24 | Disposition: A | Discharge status: Home / Self Care | Payer: Medicare Other | Source: Ambulatory Visit | Attending: Vascular Surgery | Admitting: Vascular Surgery

## 2015-09-24 VITALS — BP 133/71 | HR 60 | Temp 97.5°F | Resp 16 | Ht 64.0 in | Wt 247.0 lb

## 2015-09-24 DIAGNOSIS — I129 Hypertensive chronic kidney disease with stage 1 through stage 4 chronic kidney disease, or unspecified chronic kidney disease: Secondary | ICD-10-CM | POA: Insufficient documentation

## 2015-09-24 DIAGNOSIS — N184 Chronic kidney disease, stage 4 (severe): Secondary | ICD-10-CM

## 2015-09-24 DIAGNOSIS — Z01810 Encounter for preprocedural cardiovascular examination: Secondary | ICD-10-CM

## 2015-09-24 DIAGNOSIS — E785 Hyperlipidemia, unspecified: Secondary | ICD-10-CM | POA: Diagnosis not present

## 2015-09-24 DIAGNOSIS — E1122 Type 2 diabetes mellitus with diabetic chronic kidney disease: Secondary | ICD-10-CM | POA: Diagnosis not present

## 2015-09-24 NOTE — Progress Notes (Signed)
Referring: Debra Sheller, MD [address] [place], [place] [address]  History of Present Illness:  [name] is a 70 y.o. ([birth year]) female  who presents for placement of a permanent hemodialysis access. The patient is right handed.  The patient is not currently on hemodialysis. She never had permanent access placed.  She also denies prior central catheter placement.  Other chronic medical problems include Dm I managed with insulin, hypertension managed with multiple medications, CKD stage IV, CAD s/p stenting and history of atrial flutter with pacemaker placement on the left and chronic Eliquis therapy.    Past Medical History  Diagnosis Date  . Atrial flutter ([place])     ablated by [name] in 2008  . Hypertension   . IDDM (insulin dependent diabetes mellitus) ([place])     type 2.   . Diastolic heart failure 0000000    grade 2 diastolic dysfunction per [id number] echo  . Asthma   . Hyperlipidemia   . Fatty liver 2008    noted on ultrasound 2008  . Esophageal dysmotility 2008    noted on esophagram.  hx dysphagia.   . Arthritis   . Sleep apnea     wears CPAP  . Fatty tumor fatty tumor back  . Coronary atherosclerosis of native coronary artery   . Morbid obesity ([place])   . Myocardial infarction ([place]) 2009  . Heart murmur   . Peripheral vascular disease ([place])   . GERD (gastroesophageal reflux disease) 2012    Barrets esophagus on bx 2012 and 2014.   . Anginal pain ([place])     occ; non-ischemic Lexiscan 09/2012  . CKD (chronic kidney disease) 04/2007    CKD stage 4(Debra. Erling [name])  . Complication of anesthesia      DIFFICULTY BREATHING   . Persistent atrial fibrillation ([facility])     chads2 vasc score of at least 5  . Sick sinus syndrome ([place])   . Gouty arthropathy     Past Surgical History  Procedure Laterality Date  . Coronary angioplasty with stent placement    . Breast lumpectomy      right  . Tubal ligation    . Tonsillectomy    . Total knee  arthroplasty Right 12/15/2013    Procedure: RIGHT TOTAL KNEE ARTHROPLASTY;  Surgeon: Alta Corning, MD;  Location: [facility];  Service: Orthopedics;  Laterality: Right;  . Cardioversion N/A 03/03/2014    Procedure: CARDIOVERSION;  Surgeon: Laverda Page, MD;  Location: [facility];  Service: Cardiovascular;  Laterality: N/A;  . Atrial flutter ablation  2008    CTI ablation by [name]  . Pacemaker insertion  06/18/14    STJ Assurity dual chamber pacemaker implanted by [name]  . Right heart catheterization N/A 06/12/2014    Procedure: RIGHT HEART CATH;  Surgeon: Jolaine Artist, MD;  Location: [facility] CATH LAB;  Service: Cardiovascular;  Laterality: N/A;  . Permanent pacemaker insertion N/A 06/18/2014    SJM Assurity [place] pacemaker implanted by [name] for sick sinus syndrome  . Colonoscopy with propofol N/A 02/18/2015    Procedure: COLONOSCOPY WITH PROPOFOL;  Surgeon: Milus Banister, MD;  Location: [facility];  Service: Endoscopy;  Laterality: N/A;     Social History Social History  Substance Use Topics  . Smoking status: Former Smoker -- 0.50 packs/day for 35 years    Types: Cigarettes    Quit date: 02/16/1995  . Smokeless tobacco: Never Used  Comment: 05/2014  QUIT OVER 20 YEARS AGO "  . Alcohol Use: No    Family History Family History  Problem Relation Age of Onset  . Heart disease Mother   . Bladder Cancer Mother   . Kidney disease Mother   . Ovarian cancer Daughter   . Stomach cancer Maternal Uncle   . Colon cancer Maternal Aunt     dx in her 74's  . Esophageal cancer Neg Hx     Allergies  Allergies  Allergen Reactions  . Adhesive [Tape] Itching    EKG leads  . Sulfa Antibiotics Itching and Nausea And Vomiting    everything I seen was red  . Codeine Nausea And Vomiting  . Penicillins Nausea And Vomiting     Current Outpatient Prescriptions  Medication Sig Dispense Refill  . albuterol (PROVENTIL) (2.5 MG/3ML) 0.083% nebulizer solution Take 2.5 mg by  nebulization every 6 (six) hours as needed for shortness of breath. Use four times a day as needed for shortness of breath or wheezing    . amiodarone (PACERONE) 200 MG tablet Take 1 tablet (200 mg total) by mouth daily. 30 tablet 6  . amitriptyline (ELAVIL) 25 MG tablet Take 25 mg by mouth at bedtime.      Marland Kitchen apixaban (ELIQUIS) 5 MG TABS tablet Take 1 tablet (5 mg total) by mouth 2 (two) times daily. 60 tablet 3  . atorvastatin (LIPITOR) 40 MG tablet TAKE ONE TABLET BY MOUTH ONCE DAILY 90 tablet 0  . dicyclomine (BENTYL) 10 MG capsule Take 10 mg by mouth daily.     Marland Kitchen diltiazem (CARDIZEM CD) 180 MG 24 hr capsule Take 1 capsule (180 mg total) by mouth daily. 30 capsule 3  . esomeprazole (NEXIUM) 40 MG capsule Take 1 capsule (40 mg total) by mouth daily at 12 noon. 30 capsule 0  . febuxostat (ULORIC) 40 MG tablet Take 40 mg by mouth daily.     . hydrALAZINE (APRESOLINE) 25 MG tablet TAKE ONE TABLET BY MOUTH THREE TIMES DAILY 90 tablet 3  . HYDROcodone-acetaminophen (NORCO/VICODIN) 5-325 MG per tablet Take 1 tablet by mouth every 4 (four) hours as needed.    . insulin glargine (LANTUS) 100 UNIT/ML injection Inject 0.1 mLs (10 Units total) into the skin at bedtime. 10 mL 2  . insulin lispro (HUMALOG) 100 UNIT/ML injection Inject 0.2 mLs (20 Units total) into the skin 3 (three) times daily after meals. (Patient taking differently: Inject 50 Units into the skin every morning. ) 10 mL 11  . levothyroxine (SYNTHROID, LEVOTHROID) 25 MCG tablet Take 1 tablet by mouth daily.    . Linaclotide (LINZESS) 145 MCG CAPS capsule Take 290 mcg by mouth daily. Does 145 mcg most days and then 249mcg occ. To aid bowels    . metoprolol tartrate (LOPRESSOR) 25 MG tablet TAKE THREE TABLETS BY MOUTH TWICE DAILY 180 tablet 0  . nitroGLYCERIN (NITROSTAT) 0.4 MG SL tablet Place 0.4 mg under the tongue every 5 (five) minutes as needed for chest pain.     . OXYGEN Inhale 3 L/min into the lungs continuous.    . polyethylene glycol  (MIRALAX / GLYCOLAX) packet Take 17 g by mouth daily. 30 each 0  . potassium chloride SA (K-DUR,KLOR-CON) 20 MEQ tablet Take 3 tabs in the AM and 2 tabs in the PM 150 tablet 2  . torsemide (DEMADEX) 20 MG tablet Take 2 tablets (40 mg total) by mouth 2 (two) times daily. (Patient taking differently: Take 3 tablets by mouth  twice a day) 120 tablet 2  . Vitamin D, Ergocalciferol, (DRISDOL) 50000 UNITS CAPS capsule Take 50,000 Units by mouth every Monday.    Penne Lash HFA 45 MCG/ACT inhaler Inhale 2 puffs into the lungs every 4 (four) hours as needed for wheezing or shortness of breath. 1 Inhaler 1  . zolpidem (AMBIEN) 10 MG tablet Take 10 mg by mouth at bedtime.     Marland Kitchen ACCU-CHEK FASTCLIX LANCETS MISC     . ACCU-CHEK SMARTVIEW test strip     . HUMALOG KWIKPEN 100 UNIT/ML KiwkPen     . ketorolac (ACULAR) 0.5 % ophthalmic solution     . LANTUS SOLOSTAR 100 UNIT/ML Solostar Pen     . LINZESS 290 MCG CAPS capsule     . metolazone (ZAROXOLYN) 2.5 MG tablet     . metoprolol tartrate (LOPRESSOR) 25 MG tablet TAKE THREE TABLETS BY MOUTH TWICE DAILY (Patient not taking: Reported on 09/24/2015) 180 tablet 0  . metoprolol tartrate (LOPRESSOR) 25 MG tablet TAKE THREE TABLETS BY MOUTH TWICE DAILY (Patient not taking: Reported on 09/24/2015) 180 tablet 0  . moxifloxacin (AVELOX) 400 MG tablet     . NOVOLOG FLEXPEN 100 UNIT/ML FlexPen     . ofloxacin (OCUFLOX) 0.3 % ophthalmic solution     . prednisoLONE acetate (PRED FORTE) 1 % ophthalmic suspension     . torsemide (DEMADEX) 100 MG tablet      No current facility-administered medications for this visit.    ROS:   General:  No weight loss, Fever, chills  HEENT: No recent headaches, no nasal bleeding, no visual changes, no sore throat  Neurologic: No dizziness, blackouts, seizures. No recent symptoms of stroke or mini- stroke. No recent episodes of slurred speech, or temporary blindness.  Cardiac: No recent episodes of chest pain/pressure, no shortness of  breath at rest.  No shortness of breath with exertion.  Denies history of atrial fibrillation or irregular heartbeat  Vascular: No history of rest pain in feet.  No history of claudication.  No history of non-healing ulcer, No history of DVT   Pulmonary: No home oxygen, no productive cough, no hemoptysis,  No asthma or wheezing  Musculoskeletal:  [ ]  Arthritis, [ ]  Low back pain,  [ ]  Joint pain  Hematologic:No history of hypercoagulable state.  No history of easy bleeding.  No history of anemia  Gastrointestinal: No hematochezia or melena,  No gastroesophageal reflux, no trouble swallowing  Urinary: [ ]  chronic Kidney disease, [ ]  on HD - [ ]  MWF or [ ]  TTHS, [ ]  Burning with urination, [ ]  Frequent urination, [ ]  Difficulty urinating;   Skin: No rashes  Psychological: No history of anxiety,  No history of depression   Physical Examination  Filed Vitals:   09/24/15 1559  BP: 133/71  Pulse: 60  Temp: 97.5 F (36.4 C)  TempSrc: Oral  Resp: 16  Height: 5\' 4"  (1.626 m)  Weight: 247 lb (112.038 kg)  SpO2: 94%    Body mass index is 42.38 kg/(m^2).  General:  Alert and oriented, no acute distress HEENT: Normal Neck: No bruit or JVD Pulmonary: Clear to auscultation bilaterally Cardiac: Regular Rate and Rhythm without murmur Gastrointestinal: Soft, non-tender, non-distended, no mass, no scars Skin: No rash Extremity Pulses:  2+ radial, brachial pulses bilaterally Musculoskeletal: No deformity or edema  Neurologic: Upper and lower extremity motor intact Psychiatric: Judgment intact, Mood & affect appropriate for pt's clinical situation Lymph : No Cervical, Axillary, or Inguinal lymphadenopathy  DATA:  She has reasonable cephalic veins bilaterally on vein mapping as well as basilic. [id number] cm in diameter.   ASSESSMENT:  CKD stage IV DM I Hypertension Pace maker on the left   PLAN: We will plan on av fistula creation verses graft when the patient wants to proceed.   It was explained that she will need a minimum of 3 month to get a fistula prepared for HD and could take multiple surgeries and up to 6 months before it is usable.     Theda Sers Ladarrell [facility] PA-C [facility] Office: [phone]  The patient was seen in conjunction with Debra. Bridgett Larsson today.  He explained the risk and benefits of the procedure.  Addendum  I have independently interviewed and examined the patient, and I agree with the physician assistant's findings.  I recommend: R BC AVF vs stage BVT.  Unfortunately, she has a PPM so the Left arm may be compromised.  Adele Barthel, MD [facility] [place] Office: [phone] Pager: [phone]  09/24/2015, 5:34 PM

## 2015-09-27 ENCOUNTER — Other Ambulatory Visit: Payer: Self-pay | Admitting: *Deleted

## 2015-09-27 NOTE — Patient Outreach (Signed)
[facility]) Care Management  09/27/2015  [name] 02-Aug-1946 [id number]  Phone call from patient to follow up on personal care services assistance.  Per patient, she has chosen [employer] and they should be reaching out within 2 days to complete in home assessment.  Plan:  This social worker will contact patient on 10/01/15 to follow up on the start of personal care services.  [facility] CM Care Plan Problem One        Most Recent Value   Care Plan Problem One  patient needs to choose another personal care agency    Role Documenting the Problem One  [place] for Problem One  Active   [facility] CM Short Term Goal #1 (0-30 days)  patient to [facility] to choose another personal care agency   [facility] CM Short Term Goal #1 Start Date  09/14/15   [facility] CM Short Term Goal #1 Met Date  09/27/15 [patient has chosen [facility] for in home [place], [place] Management [phone]

## 2015-09-28 DIAGNOSIS — I10 Essential (primary) hypertension: Secondary | ICD-10-CM | POA: Diagnosis not present

## 2015-09-28 DIAGNOSIS — N184 Chronic kidney disease, stage 4 (severe): Secondary | ICD-10-CM | POA: Diagnosis not present

## 2015-09-28 DIAGNOSIS — I509 Heart failure, unspecified: Secondary | ICD-10-CM | POA: Diagnosis not present

## 2015-09-28 DIAGNOSIS — R1314 Dysphagia, pharyngoesophageal phase: Secondary | ICD-10-CM | POA: Diagnosis not present

## 2015-10-01 ENCOUNTER — Ambulatory Visit (HOSPITAL_COMMUNITY): Payer: Medicare Other

## 2015-10-01 ENCOUNTER — Ambulatory Visit (INDEPENDENT_AMBULATORY_CARE_PROVIDER_SITE_OTHER): Payer: Commercial Managed Care - HMO | Admitting: Ophthalmology

## 2015-10-07 ENCOUNTER — Other Ambulatory Visit: Payer: Self-pay | Admitting: *Deleted

## 2015-10-07 NOTE — Patient Outreach (Signed)
[facility]) Care Management  10/07/2015  [name] [birth year] [id number]   Phone call to patient to follow up on personal care services.  Per patient, the personal care aid started on 10/05/15.  However, on 10/06/15 the service was discontinued as she is no longer eligible for Medicaid.  Per patient, she would have to have $4,000.00 in medical bills to qualify.  Current family support explored.  Per patient, she still has the support of her daughter who works full time, however is there to assist as needed.  Patient verbalized no additional needs at this time.  This Education officer, museum processed patient's disappointment, emotional support provided.   Plan:  Patient's case to be closed at this time. This social worker's contact information provided if future needs arise.   Sheralyn Boatman [facility] Care Management [phone]

## 2015-10-08 ENCOUNTER — Ambulatory Visit (INDEPENDENT_AMBULATORY_CARE_PROVIDER_SITE_OTHER): Payer: Self-pay | Admitting: Ophthalmology

## 2015-10-12 ENCOUNTER — Other Ambulatory Visit: Payer: Self-pay | Admitting: *Deleted

## 2015-10-12 ENCOUNTER — Encounter: Payer: Self-pay | Admitting: *Deleted

## 2015-10-12 NOTE — Patient Outreach (Signed)
[facility]) Care Management  10/12/2015  BREEONNA LARMON Jan 04, 1946 [id number]   Request received from [facility], LCSW to mail patient a list of private duty aids in [place]. Information mailed today, 10/12/15.  [place] Management Assistant

## 2015-10-12 NOTE — Patient Outreach (Signed)
[facility]) Care Management  10/12/2015  [name] 07/03/1946 [id number]  This social worker contacted the [place] and verified that patient is number 15 on the waiting list.  Patient informed.   Patient will also be mailed a list of private duty aids as well.    Plan:  Patient's case to remain closed.  This social worker's contact information provided if services are needed in the future.    Sheralyn Boatman [facility] Care Management [phone]

## 2015-10-13 ENCOUNTER — Other Ambulatory Visit (HOSPITAL_COMMUNITY): Payer: Self-pay | Admitting: Cardiology

## 2015-10-14 ENCOUNTER — Other Ambulatory Visit (HOSPITAL_COMMUNITY): Payer: Self-pay | Admitting: Cardiology

## 2015-10-15 ENCOUNTER — Other Ambulatory Visit (HOSPITAL_COMMUNITY): Payer: Self-pay | Admitting: *Deleted

## 2015-10-18 ENCOUNTER — Other Ambulatory Visit (HOSPITAL_COMMUNITY): Payer: Self-pay | Admitting: Internal Medicine

## 2015-10-21 DIAGNOSIS — G4733 Obstructive sleep apnea (adult) (pediatric): Secondary | ICD-10-CM | POA: Diagnosis not present

## 2015-10-21 DIAGNOSIS — I509 Heart failure, unspecified: Secondary | ICD-10-CM | POA: Diagnosis not present

## 2015-10-22 ENCOUNTER — Ambulatory Visit (HOSPITAL_COMMUNITY)
Admission: RE | Admit: 2015-10-22 | Discharge: 2015-10-22 | Disposition: A | Discharge status: Home / Self Care | Payer: PPO | Source: Ambulatory Visit | Attending: Cardiology | Admitting: Cardiology

## 2015-10-22 ENCOUNTER — Ambulatory Visit (INDEPENDENT_AMBULATORY_CARE_PROVIDER_SITE_OTHER): Payer: PPO | Admitting: Pulmonary Disease

## 2015-10-22 ENCOUNTER — Encounter: Payer: Self-pay | Admitting: Pulmonary Disease

## 2015-10-22 VITALS — BP 132/70 | HR 63 | Resp 18 | Wt 254.5 lb

## 2015-10-22 VITALS — BP 102/66 | HR 60 | Temp 97.0°F | Ht 64.0 in | Wt 252.0 lb

## 2015-10-22 DIAGNOSIS — R0600 Dyspnea, unspecified: Secondary | ICD-10-CM | POA: Diagnosis not present

## 2015-10-22 DIAGNOSIS — I252 Old myocardial infarction: Secondary | ICD-10-CM | POA: Insufficient documentation

## 2015-10-22 DIAGNOSIS — I130 Hypertensive heart and chronic kidney disease with heart failure and stage 1 through stage 4 chronic kidney disease, or unspecified chronic kidney disease: Secondary | ICD-10-CM | POA: Insufficient documentation

## 2015-10-22 DIAGNOSIS — E785 Hyperlipidemia, unspecified: Secondary | ICD-10-CM | POA: Insufficient documentation

## 2015-10-22 DIAGNOSIS — M109 Gout, unspecified: Secondary | ICD-10-CM | POA: Diagnosis not present

## 2015-10-22 DIAGNOSIS — M359 Systemic involvement of connective tissue, unspecified: Secondary | ICD-10-CM

## 2015-10-22 DIAGNOSIS — Z87891 Personal history of nicotine dependence: Secondary | ICD-10-CM | POA: Insufficient documentation

## 2015-10-22 DIAGNOSIS — Z794 Long term (current) use of insulin: Secondary | ICD-10-CM | POA: Diagnosis not present

## 2015-10-22 DIAGNOSIS — K219 Gastro-esophageal reflux disease without esophagitis: Secondary | ICD-10-CM | POA: Insufficient documentation

## 2015-10-22 DIAGNOSIS — I480 Paroxysmal atrial fibrillation: Secondary | ICD-10-CM

## 2015-10-22 DIAGNOSIS — I4891 Unspecified atrial fibrillation: Secondary | ICD-10-CM | POA: Diagnosis not present

## 2015-10-22 DIAGNOSIS — I5032 Chronic diastolic (congestive) heart failure: Secondary | ICD-10-CM | POA: Diagnosis present

## 2015-10-22 DIAGNOSIS — I495 Sick sinus syndrome: Secondary | ICD-10-CM | POA: Diagnosis not present

## 2015-10-22 DIAGNOSIS — R911 Solitary pulmonary nodule: Secondary | ICD-10-CM | POA: Diagnosis not present

## 2015-10-22 DIAGNOSIS — Z7902 Long term (current) use of antithrombotics/antiplatelets: Secondary | ICD-10-CM | POA: Insufficient documentation

## 2015-10-22 DIAGNOSIS — J45909 Unspecified asthma, uncomplicated: Secondary | ICD-10-CM | POA: Diagnosis not present

## 2015-10-22 DIAGNOSIS — Z950 Presence of cardiac pacemaker: Secondary | ICD-10-CM | POA: Diagnosis not present

## 2015-10-22 DIAGNOSIS — E1122 Type 2 diabetes mellitus with diabetic chronic kidney disease: Secondary | ICD-10-CM | POA: Insufficient documentation

## 2015-10-22 DIAGNOSIS — J8410 Pulmonary fibrosis, unspecified: Secondary | ICD-10-CM

## 2015-10-22 DIAGNOSIS — I739 Peripheral vascular disease, unspecified: Secondary | ICD-10-CM | POA: Diagnosis not present

## 2015-10-22 DIAGNOSIS — Z79899 Other long term (current) drug therapy: Secondary | ICD-10-CM | POA: Diagnosis not present

## 2015-10-22 DIAGNOSIS — N184 Chronic kidney disease, stage 4 (severe): Secondary | ICD-10-CM | POA: Diagnosis not present

## 2015-10-22 DIAGNOSIS — G4733 Obstructive sleep apnea (adult) (pediatric): Secondary | ICD-10-CM | POA: Diagnosis not present

## 2015-10-22 DIAGNOSIS — J849 Interstitial pulmonary disease, unspecified: Secondary | ICD-10-CM | POA: Insufficient documentation

## 2015-10-22 DIAGNOSIS — I2510 Atherosclerotic heart disease of native coronary artery without angina pectoris: Secondary | ICD-10-CM | POA: Diagnosis not present

## 2015-10-22 DIAGNOSIS — Z8249 Family history of ischemic heart disease and other diseases of the circulatory system: Secondary | ICD-10-CM | POA: Diagnosis not present

## 2015-10-22 LAB — TSH: TSH: 2.868 u[IU]/mL (ref 0.350–4.500)

## 2015-10-22 LAB — CBC
HCT: 46.9 % — ABNORMAL HIGH (ref 36.0–46.0)
Hemoglobin: 15.8 g/dL — ABNORMAL HIGH (ref 12.0–15.0)
MCH: 32.6 pg (ref 26.0–34.0)
MCHC: 33.7 g/dL (ref 30.0–36.0)
MCV: 96.9 fL (ref 78.0–100.0)
Platelets: 181 10*3/uL (ref 150–400)
RBC: 4.84 MIL/uL (ref 3.87–5.11)
RDW: 14.4 % (ref 11.5–15.5)
WBC: 6.6 10*3/uL (ref 4.0–10.5)

## 2015-10-22 LAB — COMPREHENSIVE METABOLIC PANEL
ALT: 21 U/L (ref 14–54)
AST: 28 U/L (ref 15–41)
Albumin: 3.8 g/dL (ref 3.5–5.0)
Alkaline Phosphatase: 70 U/L (ref 38–126)
Anion gap: 17 — ABNORMAL HIGH (ref 5–15)
BUN: 50 mg/dL — ABNORMAL HIGH (ref 6–20)
CO2: 25 mmol/L (ref 22–32)
Calcium: 9.5 mg/dL (ref 8.9–10.3)
Chloride: 100 mmol/L — ABNORMAL LOW (ref 101–111)
Creatinine, Ser: 3.22 mg/dL — ABNORMAL HIGH (ref 0.44–1.00)
GFR calc Af Amer: 16 mL/min — ABNORMAL LOW (ref >60)
GFR calc non Af Amer: 14 mL/min — ABNORMAL LOW (ref >60)
Glucose, Bld: 203 mg/dL — ABNORMAL HIGH (ref 65–99)
Potassium: 3 mmol/L — ABNORMAL LOW (ref 3.5–5.1)
Sodium: 142 mmol/L (ref 135–145)
Total Bilirubin: 0.5 mg/dL (ref 0.3–1.2)
Total Protein: 7.2 g/dL (ref 6.5–8.1)

## 2015-10-22 NOTE — Patient Instructions (Addendum)
Routine lab work today. Will notify you of abnormal results  Your provider has referred you to Rheumatology and Pulmonary.  Follow up with Dr.McLean in 4  months

## 2015-10-22 NOTE — Patient Instructions (Signed)
Will arrange for pulmonary function testing and CT chest  Follow up in 3 to 4 weeks with Dr. Halford Chessman or Nurse Practitioner

## 2015-10-22 NOTE — Progress Notes (Signed)
Current Outpatient Prescriptions on File Prior to Visit  Medication Sig  . ACCU-CHEK FASTCLIX LANCETS MISC   . ACCU-CHEK SMARTVIEW test strip   . albuterol (PROVENTIL) (2.5 MG/3ML) 0.083% nebulizer solution Take 2.5 mg by nebulization every 6 (six) hours as needed for shortness of breath. Use four times a day as needed for shortness of breath or wheezing  . amiodarone (PACERONE) 200 MG tablet Take 1 tablet (200 mg total) by mouth daily.  Marland Kitchen amitriptyline (ELAVIL) 25 MG tablet Take 25 mg by mouth at bedtime.    Marland Kitchen apixaban (ELIQUIS) 5 MG TABS tablet Take 1 tablet (5 mg total) by mouth 2 (two) times daily.  Marland Kitchen atorvastatin (LIPITOR) 40 MG tablet TAKE ONE TABLET BY MOUTH ONCE DAILY  . dicyclomine (BENTYL) 10 MG capsule Take 10 mg by mouth daily.   Marland Kitchen diltiazem (CARDIZEM CD) 180 MG 24 hr capsule Take 1 capsule (180 mg total) by mouth daily.  Marland Kitchen esomeprazole (NEXIUM) 40 MG capsule Take 1 capsule (40 mg total) by mouth daily at 12 noon.  . febuxostat (ULORIC) 40 MG tablet Take 40 mg by mouth daily.   Marland Kitchen HUMALOG KWIKPEN 100 UNIT/ML KiwkPen   . hydrALAZINE (APRESOLINE) 25 MG tablet TAKE ONE TABLET BY MOUTH THREE TIMES DAILY  . insulin glargine (LANTUS) 100 UNIT/ML injection Inject 0.1 mLs (10 Units total) into the skin at bedtime.  . insulin lispro (HUMALOG) 100 UNIT/ML injection Inject 0.2 mLs (20 Units total) into the skin 3 (three) times daily after meals. (Patient taking differently: Inject 50 Units into the skin every morning. )  . levothyroxine (SYNTHROID, LEVOTHROID) 25 MCG tablet Take 1 tablet by mouth daily.  . Linaclotide (LINZESS) 145 MCG CAPS capsule Take 290 mcg by mouth daily. Does 145 mcg most days and then 258mg occ. To aid bowels  . metolazone (ZAROXOLYN) 2.5 MG tablet Take 2.5 mg by mouth as needed.   . metoprolol tartrate (LOPRESSOR) 25 MG tablet TAKE THREE TABLETS BY MOUTH TWICE DAILY  . OXYGEN Inhale 3 L/min into the lungs continuous.  . polyethylene glycol (MIRALAX / GLYCOLAX) packet  Take 17 g by mouth daily.  . potassium chloride SA (K-DUR,KLOR-CON) 20 MEQ tablet Take 60 mEq by mouth 2 (two) times daily.  .Marland Kitchentorsemide (DEMADEX) 100 MG tablet Take 100 mg by mouth 2 (two) times daily.  . Vitamin D, Ergocalciferol, (DRISDOL) 50000 UNITS CAPS capsule Take 50,000 Units by mouth every Monday.  .Penne LashHFA 45 MCG/ACT inhaler Inhale 2 puffs into the lungs every 4 (four) hours as needed for wheezing or shortness of breath.  . zolpidem (AMBIEN) 10 MG tablet Take 10 mg by mouth at bedtime.   .Marland KitchenHYDROcodone-acetaminophen (NORCO/VICODIN) 5-325 MG per tablet Take 1 tablet by mouth every 4 (four) hours as needed. Reported on 10/22/2015  . nitroGLYCERIN (NITROSTAT) 0.4 MG SL tablet Place 0.4 mg under the tongue every 5 (five) minutes as needed for chest pain. Reported on 10/22/2015   No current facility-administered medications on file prior to visit.     Chief Complaint  Patient presents with  . Acute Visit    Pt c/o increased cough since her last visit- non prod. She states that she continues to have pressure and tightness in her chest, but no more than usual. Her breathing is unchanged since the last visit.      Tests Echo 03/06/14 >> mild LVH, EF 60 to 65% Esophagram 04/24/14 >> severe esophageal dysmotility, esophageal stasis, small HH PFT 02/04/15 >> FEV1 1.88 (100%),  FEV1% 83, TLC 3.79 (75%), DLCO 28%, +BD Serology 02/16/15 >> anti CCP negative, RF 23, anti Jo negative, Scl 70 negative, SSa/SSb negative, ANA negative, ESR 48 Echo 02/17/15 >> EF 60 to 33%, grade 2 diastolic dysfx, PAS 38 mmHg HR CT chest 03/26/15 >> patchy GGO with areas of btx, air trapping, 7 mm nodule in lingula  Past medical hx A flutter/fib, HTN, Diastolic CHF, HLD, CAD s/p stent, Fatty liver, GERD, CKD  Past surgical hx, Allergies, Family hx, Social hx all reviewed.  Vital Signs BP 102/66 mmHg  Pulse 60  Temp(Src) 97 F (36.1 C) (Oral)  Ht 5' 4"  (1.626 m)  Wt 252 lb (114.306 kg)  BMI 43.23 kg/m2   SpO2 97%  History of Present Illness [name] is a 70 y.o. female former smoker dyspnea.  I last saw her in 2016.  She was being assessed for dyspnea.  She had PFT which showed severe diffusion defect >> however, she also had severe anemia at that time.  She had HRCT chest which showed patchy GGO, air trapping, and a lung nodule.  She has been seen in heart failure clinic for diastolic CHF.  She had mild elevation in rheumatoid factor and ESR.  She has been followed by Dr. Florene Glen for CKD.  She has appt next week with Dr. Amil Amen with rheumatology.  She has frequent cough.  She occasionally brings up clear sputum.  She feels like food gets stuck sometimes.  She gets winded with minimal activity.  She gets pain in her knees and her feet.  She has not had skin rash, hemoptysis, fever, sweats.  She is using oxygen at night.  Physical Exam  General - No distress ENT - No sinus tenderness, no oral exudate, no LAN Cardiac - s1s2 regular, no murmur Chest - No wheeze/rales/dullness Back - No focal tenderness Abd - Soft, non-tender Ext - No edema Neuro - Normal strength Skin - No rashes Psych - normal mood, and behavior   CMP Latest Ref Rng 10/22/2015 04/23/2015 03/19/2015  Glucose 65 - 99 mg/dL 203(H) 253(H) 367(H)  BUN 6 - 20 mg/dL 50(H) 41(H) 48(H)  Creatinine 0.44 - 1.00 mg/dL 3.22(H) 3.42(H) 3.31(H)  Sodium 135 - 145 mmol/L 142 137 139  Potassium 3.5 - 5.1 mmol/L 3.0(L) 3.5 3.4(L)  Chloride 101 - 111 mmol/L 100(L) 99(L) 98(L)  CO2 22 - 32 mmol/L 25 25 26   Calcium 8.9 - 10.3 mg/dL 9.5 9.5 10.0  Total Protein 6.5 - 8.1 g/dL 7.2 - 7.5  Total Bilirubin 0.3 - 1.2 mg/dL 0.5 - 0.5  Alkaline Phos 38 - 126 U/L 70 - 70  AST 15 - 41 U/L 28 - 33  ALT 14 - 54 U/L 21 - 23    CBC Latest Ref Rng 10/22/2015 07/09/2015 03/26/2015  WBC 4.0 - 10.5 K/uL 6.6 7.0 7.5  Hemoglobin 12.0 - 15.0 g/dL 15.8(H) 14.9 13.4  Hematocrit 36.0 - 46.0 % 46.9(H) 45.1 42.4  Platelets 150 - 400 K/uL 181 163.0  212.0      Discussion: She has persistent cough, and dyspnea.  Her testing from last summer showed diffusion defect on PFT and GGO on CT chest.  Concern has been for ILD.  She had minimal elevation in rheumatoid factor from last year >> I am not sure this is significant.   Assessment/Plan  Dyspnea, cough with ?ILD. Plan: - repeat CT chest w/o contrast, PFT - she has appt with rheumatology later this month - continue prn xopenex - might  need further assessment of swallowing and reflux to exclude possibility of aspiration  Lung nodule. Plan: - f/u CT chest w/o contrast  A fib/flutter. Diastolic CHF. Plan: - per cardiology - she is on amiodarone   Patient Instructions  Will arrange for pulmonary function testing and CT chest  Follow up in 3 to 4 weeks with Dr. Halford Chessman or Nurse Practitioner     Chesley Mires, MD [place] Pager:  [phone]

## 2015-10-24 DIAGNOSIS — J8410 Pulmonary fibrosis, unspecified: Secondary | ICD-10-CM | POA: Insufficient documentation

## 2015-10-24 NOTE — Progress Notes (Signed)
Patient ID: [name], female   DOB: 22-Sep-1945, 70 y.o.   MRN: [id number] PCP: Dr. Noah Delaine  Nephrologist: Dr. Florene Glen EP: Dr Rayann Heman   HPI: [name] is a 70 year old woman with hypertension, hyperlipidemia, bronchial asthma, diabetes mellitus, CKD stage IV (baseline cr ~2.5), atrial fibrillation and diastolic HF.  [name] was admitted in 4/15 for R TKR. That hospitalization complicated by a/c diastolic HF with respiratory distress requiring non-rebreather support. Weight on discharge was 226 pounds. ABG at that time 7.4/34/91/97%. Cr peaked at 2.9  Admitted 6/30-7/10/15 for SOB and CP following scheduled cardioversion. Found to have severe aspiration PNA and was intubated. [name] maintained SR for short period of time and then went back into Afib/Aflutter.  HR controlled on amio and diltiazem. Discharged to [facility] at a weight of 232 lbs.   Admitted 06/16/14 with symptomatic tachy/brady syndrome. 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 Discharge weight was 223 pounds.   Admitted 02/16/15 for symptomatic anemia s/p blood work from her pulmonologist.  Was diuresed with lasix while in hospital.   Last echo in 6/16 with EF 60-65%, PASP 37 mmHg.   PFTs (6/16) with FVC 80%, FEV1 83%, ratio 103%, TLC 75%, DLCO 78% => restriction c/w interstitial process.  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   Holter (7/16) with rare PACs, PVCs; no atrial fibrillation.   After her CT showing interstitial fibrosis, [name] saw pulmonary. There was concern for connective tissue disease with lung involvement and [name] was sent to see a rheumatologist.  [name] does not remember who it was and says that [name] was not given a diagnosis.  The pulmonologist did not think that her ILD was due to amiodarone, so [name] is still on amiodarone.  [name] continues to have joint pain with knees and ankles hurting.  [name] is short of breath with most  exertion, including walking around the house.  This has been slowly progressive.  Creatinine remains elevated, and [name] has been referred for AV fistula surgery.  Weight is up 7 lbs since last appointment.  No chest pain. No palpitations.   Labs: 03/13/14: K+ 3.4, creatinine 2.10, BUN 16 04/02/14: K 5.6, creatinine 2.66, BUN 29 04/13/14: K 4.0, creatinine 2.66, K 5.6 06/20/14 K 3.7 creatinine 2.35  07/21/14 K 3.6 creatinine 2.55 2/16 K 3.8, creatinine 3.03, LFTs normal, TSH normal, hgb 12.8  5/16 K 3.5, creatinine 3.37, LFTs normal, TSH mildly elevated, T3 just below normal, T4 normal. BNP 147 6/16 K 3.9, creatinine 2.98, BNP 364.2, SCL-70 negative, RF elevated but CCP negative, ANA negative, ESR 48.  7/16 K 3.4, creatinine 3.31 LFTs normal, TSH and T4 normal, T3 just below normal, HCT 42.4 11/16: K 3.5, creatinine 3.4, HCT 45.1  ECG: a-paced, LVH   ROS: All systems negative except as listed in HPI, PMH and Problem List.  SH:  Social History   Social History  . Marital Status: Widowed    Spouse Name: N/A  . Number of Children: 3  . Years of Education: N/A   Occupational History  . Disabled    Social History Main Topics  . Smoking status: Former Smoker -- 0.50 packs/day for 35 years    Types: Cigarettes    Quit date: 02/16/1995  . Smokeless tobacco: Never Used     Comment: 05/2014  QUIT OVER 20 YEARS AGO "  . Alcohol Use: No  . Drug Use: No  . Sexual Activity: Not Currently  Other Topics Concern  . Not on file   Social History Narrative    FH:  Family History  Problem Relation Age of Onset  . Heart disease Mother   . Bladder Cancer Mother   . Kidney disease Mother   . Ovarian cancer Daughter   . Stomach cancer Maternal Uncle   . Colon cancer Maternal Aunt     dx in her 70's  . Esophageal cancer Neg Hx     Past Medical History  Diagnosis Date  . Atrial flutter (HCC)     ablated by Dr Taylor in 2008  . Hypertension   . 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   . Diastolic heart failure 03/2014    grade 2 diastolic dysfunction per 02/2015 echo  . Asthma   . Hyperlipidemia   . Fatty liver 2008    noted on ultrasound 2008  . Esophageal dysmotility 2008    noted on esophagram.  hx dysphagia.   . Arthritis   . Sleep apnea     wears CPAP  . Fatty tumor fatty tumor back  . Coronary atherosclerosis of native coronary artery   . Morbid obesity (HCC)   . Myocardial infarction (HCC) 2009  . Heart murmur   . Peripheral vascular disease (HCC)   . GERD (gastroesophageal reflux disease) 2012    Barrets esophagus on bx 2012 and 2014.   . Anginal pain (HCC)     occ; non-ischemic Lexiscan 09/2012  . CKD (chronic kidney disease) 04/2007    CKD stage 4(Dr. Alvin Powell)  . Complication of anesthesia      DIFFICULTY BREATHING   . Persistent atrial fibrillation (HCC)     chads2 vasc score of at least 5  . Sick sinus syndrome (HCC)   . Gouty arthropathy    Current Outpatient Prescriptions  Medication Sig Dispense Refill  . ACCU-CHEK FASTCLIX LANCETS MISC     . ACCU-CHEK SMARTVIEW test strip     . albuterol (PROVENTIL) (2.5 MG/3ML) 0.083% nebulizer solution Take 2.5 mg by nebulization every 6 (six) hours as needed for shortness of breath. Use four times a day as needed for shortness of breath or wheezing    . amiodarone (PACERONE) 200 MG tablet Take 1 tablet (200 mg total) by mouth daily. 30 tablet 6  . amitriptyline (ELAVIL) 25 MG tablet Take 25 mg by mouth at bedtime.      . apixaban (ELIQUIS) 5 MG TABS tablet Take 1 tablet (5 mg total) by mouth 2 (two) times daily. 60 tablet 3  . atorvastatin (LIPITOR) 40 MG tablet TAKE ONE TABLET BY MOUTH ONCE DAILY 90 tablet 0  . dicyclomine (BENTYL) 10 MG capsule Take 10 mg by mouth daily.     . diltiazem (CARDIZEM CD) 180 MG 24 hr capsule Take 1 capsule (180 mg total) by mouth daily. 30 capsule 3  . esomeprazole (NEXIUM) 40 MG capsule Take 1 capsule (40 mg total) by mouth daily at 12  noon. 30 capsule 0  . febuxostat (ULORIC) 40 MG tablet Take 40 mg by mouth daily.     . HUMALOG KWIKPEN 100 UNIT/ML KiwkPen     . hydrALAZINE (APRESOLINE) 25 MG tablet TAKE ONE TABLET BY MOUTH THREE TIMES DAILY 90 tablet 3  . HYDROcodone-acetaminophen (NORCO/VICODIN) 5-325 MG per tablet Take 1 tablet by mouth every 4 (four) hours as needed. Reported on 10/22/2015    . insulin glargine (LANTUS) 100 UNIT/ML injection Inject 0.1 mLs (10   Units total) into the skin at bedtime. 10 mL 2  . insulin lispro (HUMALOG) 100 UNIT/ML injection Inject 0.2 mLs (20 Units total) into the skin 3 (three) times daily after meals. (Patient taking differently: Inject 50 Units into the skin every morning. ) 10 mL 11  . levothyroxine (SYNTHROID, LEVOTHROID) 25 MCG tablet Take 1 tablet by mouth daily.    . Linaclotide (LINZESS) 145 MCG CAPS capsule Take 290 mcg by mouth daily. Does 145 mcg most days and then 255mg occ. To aid bowels    . metolazone (ZAROXOLYN) 2.5 MG tablet Take 2.5 mg by mouth as needed.     . metoprolol tartrate (LOPRESSOR) 25 MG tablet TAKE THREE TABLETS BY MOUTH TWICE DAILY 180 tablet 0  . nitroGLYCERIN (NITROSTAT) 0.4 MG SL tablet Place 0.4 mg under the tongue every 5 (five) minutes as needed for chest pain. Reported on 10/22/2015    . OXYGEN Inhale 3 L/min into the lungs continuous.    . polyethylene glycol (MIRALAX / GLYCOLAX) packet Take 17 g by mouth daily. 30 each 0  . potassium chloride SA (K-DUR,KLOR-CON) 20 MEQ tablet Take 60 mEq by mouth 2 (two) times daily.    .Marland Kitchentorsemide (DEMADEX) 100 MG tablet Take 100 mg by mouth 2 (two) times daily.    . Vitamin D, Ergocalciferol, (DRISDOL) 50000 UNITS CAPS capsule Take 50,000 Units by mouth every Monday.    .Penne LashHFA 45 MCG/ACT inhaler Inhale 2 puffs into the lungs every 4 (four) hours as needed for wheezing or shortness of breath. 1 Inhaler 1  . zolpidem (AMBIEN) 10 MG tablet Take 10 mg by mouth at bedtime.      No current facility-administered  medications for this encounter.    Filed Vitals:   10/22/15 0905  BP: 132/70  Pulse: 63  Resp: 18  Weight: 254 lb 8 oz (115.44 kg)  SpO2: 92%    PHYSICAL EXAM: General: Obese, NAD, in wheelchair. Daughter present HEENT: normal  Neck: supple. JVP 7 cm; Carotids 2+ bilat; no bruits. No lymphadenopathy or thryomegaly  Cor: PMI nonpalpable. Distant. Regular rate. No murmur appreciated Lungs: Slight crackles at bases bilaterally.  Abdomen: Obese, soft, nontender. mild-distended. No hepatosplenomegaly. No bruits or masses. +BS Extremities: no cyanosis, clubbing, rash, warm. No edema.  Neuro: alert & orientedx3, cranial nerves grossly intact. moves all 4 extremities w/o difficulty. Affect pleasant   ASSESSMENT & PLAN:  1) Chronic diastolic HF: EF 622-97%(69/89echo). NYHA class IIIb symptoms, fairly stable.  Weight is up by our scale, but [name] does not appear particularly volume overloaded on exam.  Dyspnea likely has a prominent pulmonary component with restrictive PFTs from body habitus and suspected interstitial lung disease (NSIP pattern by high resolution CT).  [name] is on a high dose of torsemide, has been titrated up by nephrology.  - Check BMET/BNP today.   - Continue torsemide 100 mg bid.    2) Afib/Aflutter:  [name] remains in NSR on amiodarone.  I had her see pulmonary because of interstitial lung disease, this is not thought to be due to amiodarone.   - Check LFTs and TSH given amiodarone use.  [name] will need regular eye exams on amiodarone.  - No bleeding problems on Eliquis (h/o cecal AVM s/p ablation).   3) CKD stage IV: BMET today. Sees nephrology.  4) Tachy/Brady syndrome: St Jude PPM.   5) OSA: Use CPAP. 6) Interstitial lung disease: Saw Dr SHalford Chessmanin the past, there was some concern for rheumatological  disease - related ILD.  ESR was mildly elevated and RF was elevated, though CCP was normal. I think that her current dyspnea is in large part ILD-related.  - [name] needs to followup  with pulmonary, I will arrange.  - I'm not sure we ever got a full rheumatologic evaluation since ILD was diagnosed.  [name] had seen Dr Beekman at some point in the past, I will refer her back to him.    Dalton McLean 10/24/2015   

## 2015-10-27 ENCOUNTER — Other Ambulatory Visit: Payer: PPO

## 2015-10-29 ENCOUNTER — Ambulatory Visit (INDEPENDENT_AMBULATORY_CARE_PROVIDER_SITE_OTHER)
Admission: RE | Admit: 2015-10-29 | Discharge: 2015-10-29 | Disposition: A | Discharge status: Home / Self Care | Payer: PPO | Source: Ambulatory Visit | Attending: Pulmonary Disease | Admitting: Pulmonary Disease

## 2015-10-29 DIAGNOSIS — M2550 Pain in unspecified joint: Secondary | ICD-10-CM | POA: Diagnosis not present

## 2015-10-29 DIAGNOSIS — R911 Solitary pulmonary nodule: Secondary | ICD-10-CM

## 2015-10-29 DIAGNOSIS — I509 Heart failure, unspecified: Secondary | ICD-10-CM | POA: Diagnosis not present

## 2015-10-29 DIAGNOSIS — R0600 Dyspnea, unspecified: Secondary | ICD-10-CM | POA: Diagnosis not present

## 2015-10-29 DIAGNOSIS — J849 Interstitial pulmonary disease, unspecified: Secondary | ICD-10-CM | POA: Diagnosis not present

## 2015-10-29 DIAGNOSIS — M1A09X0 Idiopathic chronic gout, multiple sites, without tophus (tophi): Secondary | ICD-10-CM | POA: Diagnosis not present

## 2015-10-29 DIAGNOSIS — M7989 Other specified soft tissue disorders: Secondary | ICD-10-CM | POA: Diagnosis not present

## 2015-10-29 DIAGNOSIS — I10 Essential (primary) hypertension: Secondary | ICD-10-CM | POA: Diagnosis not present

## 2015-10-29 DIAGNOSIS — R918 Other nonspecific abnormal finding of lung field: Secondary | ICD-10-CM | POA: Diagnosis not present

## 2015-10-29 DIAGNOSIS — R1314 Dysphagia, pharyngoesophageal phase: Secondary | ICD-10-CM | POA: Diagnosis not present

## 2015-11-03 ENCOUNTER — Encounter: Payer: Self-pay | Admitting: *Deleted

## 2015-11-08 ENCOUNTER — Telehealth (HOSPITAL_COMMUNITY): Payer: Self-pay | Admitting: *Deleted

## 2015-11-08 MED ORDER — POTASSIUM CHLORIDE CRYS ER 20 MEQ PO TBCR: EXTENDED_RELEASE_TABLET | ORAL | Status: DC

## 2015-11-08 NOTE — Telephone Encounter (Signed)
Notes Recorded by Scarlette Calico, RN on 11/08/2015 at 2:40 PM Pt aware and agreeable

## 2015-11-08 NOTE — Telephone Encounter (Signed)
-----   Message from Larey Dresser, MD sent at 10/25/2015 12:48 AM EST ----- Take 40 mEq po x 1 KCl on day 1, then take KCl 20 daily after that.

## 2015-11-11 ENCOUNTER — Encounter: Payer: Self-pay | Admitting: Adult Health

## 2015-11-11 ENCOUNTER — Ambulatory Visit (INDEPENDENT_AMBULATORY_CARE_PROVIDER_SITE_OTHER): Payer: PPO | Admitting: Adult Health

## 2015-11-11 VITALS — BP 140/78 | HR 72 | Temp 98.2°F | Ht 64.0 in | Wt 258.0 lb

## 2015-11-11 DIAGNOSIS — Z79899 Other long term (current) drug therapy: Secondary | ICD-10-CM | POA: Diagnosis not present

## 2015-11-11 DIAGNOSIS — G4733 Obstructive sleep apnea (adult) (pediatric): Secondary | ICD-10-CM

## 2015-11-11 DIAGNOSIS — I129 Hypertensive chronic kidney disease with stage 1 through stage 4 chronic kidney disease, or unspecified chronic kidney disease: Secondary | ICD-10-CM | POA: Diagnosis not present

## 2015-11-11 DIAGNOSIS — R911 Solitary pulmonary nodule: Secondary | ICD-10-CM

## 2015-11-11 DIAGNOSIS — M109 Gout, unspecified: Secondary | ICD-10-CM | POA: Diagnosis not present

## 2015-11-11 DIAGNOSIS — J849 Interstitial pulmonary disease, unspecified: Secondary | ICD-10-CM

## 2015-11-11 DIAGNOSIS — E559 Vitamin D deficiency, unspecified: Secondary | ICD-10-CM | POA: Diagnosis not present

## 2015-11-11 DIAGNOSIS — E1142 Type 2 diabetes mellitus with diabetic polyneuropathy: Secondary | ICD-10-CM | POA: Diagnosis not present

## 2015-11-11 NOTE — Patient Instructions (Signed)
Continue on CPAP At bedtime   Remain on Oxygen 3l/m . At bedtime  With CPAP  Follow up Dr. Halford Chessman  In 3 months and As needed   Please contact office for sooner follow up if symptoms do not improve or worsen or seek emergency care

## 2015-11-12 ENCOUNTER — Ambulatory Visit (INDEPENDENT_AMBULATORY_CARE_PROVIDER_SITE_OTHER): Payer: PPO | Admitting: Ophthalmology

## 2015-11-12 DIAGNOSIS — I10 Essential (primary) hypertension: Secondary | ICD-10-CM

## 2015-11-12 DIAGNOSIS — E11319 Type 2 diabetes mellitus with unspecified diabetic retinopathy without macular edema: Secondary | ICD-10-CM | POA: Diagnosis not present

## 2015-11-12 DIAGNOSIS — H35033 Hypertensive retinopathy, bilateral: Secondary | ICD-10-CM

## 2015-11-12 DIAGNOSIS — E113293 Type 2 diabetes mellitus with mild nonproliferative diabetic retinopathy without macular edema, bilateral: Secondary | ICD-10-CM | POA: Diagnosis not present

## 2015-11-12 DIAGNOSIS — H43813 Vitreous degeneration, bilateral: Secondary | ICD-10-CM

## 2015-11-12 DIAGNOSIS — I5030 Unspecified diastolic (congestive) heart failure: Secondary | ICD-10-CM | POA: Diagnosis not present

## 2015-11-12 DIAGNOSIS — N184 Chronic kidney disease, stage 4 (severe): Secondary | ICD-10-CM | POA: Diagnosis not present

## 2015-11-12 DIAGNOSIS — E8770 Fluid overload, unspecified: Secondary | ICD-10-CM | POA: Diagnosis not present

## 2015-11-15 DIAGNOSIS — G4733 Obstructive sleep apnea (adult) (pediatric): Secondary | ICD-10-CM | POA: Diagnosis not present

## 2015-11-15 NOTE — Progress Notes (Signed)
Subjective:    Patient ID: [name], female    DOB: [birth year], 70 y.o.   MRN: [id number]  HPI [name] is a 70 y.o. female former smoker with aspiration PNA . OSA  Gr 2 DCHF ,    Tests: Echo 03/06/14 >> mild LVH, EF 60 to 65% Echo 02/2015 with Gr 2 D CHF , PAP 38 , EF 60%.  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  Serology 02/16/15 >> anti CCP negative, RF 23, anti Jo negative, Scl 70 negative, SSa/SSb negative, ANA negative, ESR 48  11/11/15  Follow up : ILD   Pt returns for 3 week follow up . She is followed for presumed ILD changes on CT .  CT chest was repeated on 2/24 showed stable 7 mm nodule in lingula . Stable interstitial patten in the lingula.  Remains on On oxygen with act and At bedtime with 3l/m  Previous labs showed +RA factor. Pt has seen Dr. Amil Amen with Rheumatology.  Records requested. .  She does complain of DOE and dry cough .  She has an upcoming PFT .  Denies chest pain, orthopnea, or increased edema.  Remains on CPAP At bedtime  . Doing ok on machine with no sign daytime sleepiness.      Past Medical History  Diagnosis Date  . Atrial flutter ([place])     ablated by Dr Lovena Le in 2008  . Hypertension   . IDDM (insulin dependent diabetes mellitus) ([place])     type 2.   . Diastolic heart failure 03/1244    grade 2 diastolic dysfunction per 04/997 echo  . Asthma   . Hyperlipidemia   . Fatty liver 2008    noted on ultrasound 2008  . Esophageal dysmotility 2008    noted on esophagram.  hx dysphagia.   . Arthritis   . Sleep apnea     wears CPAP  . Fatty tumor fatty tumor back  . Coronary atherosclerosis of native coronary artery   . Morbid obesity ([place])   . Myocardial infarction ([place]) 2009  . Heart murmur   . Peripheral vascular disease ([place])   . GERD (gastroesophageal  reflux disease) 2012    Barrets esophagus on bx 2012 and 2014.   . Anginal pain ([place])     occ; non-ischemic Lexiscan 09/2012  . CKD (chronic kidney disease) 04/2007    CKD stage 4(Dr. Erling Cruz)  . Complication of anesthesia      DIFFICULTY BREATHING   . Persistent atrial fibrillation (HCC)     chads2 vasc score of at least 5  . Sick sinus syndrome ([place])   . Gouty arthropathy    Current Outpatient Prescriptions on File Prior to Visit  Medication Sig Dispense Refill  . ACCU-CHEK FASTCLIX LANCETS MISC     . ACCU-CHEK SMARTVIEW test strip     . albuterol (PROVENTIL) (2.5 MG/3ML) 0.083% nebulizer solution Take 2.5 mg by nebulization every 6 (six) hours as needed for shortness of breath. Use four times a day as needed for shortness of breath or wheezing    . amiodarone (PACERONE) 200 MG tablet Take 1 tablet (200 mg total) by mouth daily. 30 tablet 6  . amitriptyline (ELAVIL) 25 MG tablet Take 25 mg by mouth at bedtime.      Marland Kitchen apixaban (ELIQUIS) 5 MG TABS tablet  Take 1 tablet (5 mg total) by mouth 2 (two) times daily. 60 tablet 3  . atorvastatin (LIPITOR) 40 MG tablet TAKE ONE TABLET BY MOUTH ONCE DAILY 90 tablet 0  . dicyclomine (BENTYL) 10 MG capsule Take 10 mg by mouth daily.     Marland Kitchen diltiazem (CARDIZEM CD) 180 MG 24 hr capsule Take 1 capsule (180 mg total) by mouth daily. 30 capsule 3  . esomeprazole (NEXIUM) 40 MG capsule Take 1 capsule (40 mg total) by mouth daily at 12 noon. 30 capsule 0  . febuxostat (ULORIC) 40 MG tablet Take 40 mg by mouth daily.     Marland Kitchen HUMALOG KWIKPEN 100 UNIT/ML KiwkPen     . hydrALAZINE (APRESOLINE) 25 MG tablet TAKE ONE TABLET BY MOUTH THREE TIMES DAILY 90 tablet 3  . HYDROcodone-acetaminophen (NORCO/VICODIN) 5-325 MG per tablet Take 1 tablet by mouth every 4 (four) hours as needed. Reported on 10/22/2015    . insulin glargine (LANTUS) 100 UNIT/ML injection Inject 0.1 mLs (10 Units total) into the skin at bedtime. 10 mL 2  . insulin lispro (HUMALOG) 100 UNIT/ML  injection Inject 0.2 mLs (20 Units total) into the skin 3 (three) times daily after meals. (Patient taking differently: Inject 50 Units into the skin every morning. ) 10 mL 11  . levothyroxine (SYNTHROID, LEVOTHROID) 25 MCG tablet Take 1 tablet by mouth daily.    . Linaclotide (LINZESS) 145 MCG CAPS capsule Take 290 mcg by mouth daily. Does 145 mcg most days and then 240mg occ. To aid bowels    . metolazone (ZAROXOLYN) 2.5 MG tablet Take 2.5 mg by mouth as needed.     . metoprolol tartrate (LOPRESSOR) 25 MG tablet TAKE THREE TABLETS BY MOUTH TWICE DAILY 180 tablet 0  . nitroGLYCERIN (NITROSTAT) 0.4 MG SL tablet Place 0.4 mg under the tongue every 5 (five) minutes as needed for chest pain. Reported on 10/22/2015    . OXYGEN Inhale 3 L/min into the lungs continuous.    . polyethylene glycol (MIRALAX / GLYCOLAX) packet Take 17 g by mouth daily. 30 each 0  . potassium chloride SA (K-DUR,KLOR-CON) 20 MEQ tablet Take 4 tabs in AM and 3 tabs in PM    . torsemide (DEMADEX) 100 MG tablet Take 100 mg by mouth 2 (two) times daily.    . Vitamin D, Ergocalciferol, (DRISDOL) 50000 UNITS CAPS capsule Take 50,000 Units by mouth every Monday.    .Penne LashHFA 45 MCG/ACT inhaler Inhale 2 puffs into the lungs every 4 (four) hours as needed for wheezing or shortness of breath. 1 Inhaler 1  . zolpidem (AMBIEN) 10 MG tablet Take 10 mg by mouth at bedtime.      No current facility-administered medications on file prior to visit.       Review of Systems Constitutional:   No  weight loss, night sweats,  Fevers, chills,  +fatigue, or  lassitude.  HEENT:   No headaches,  Difficulty swallowing,  Tooth/dental problems, or  Sore throat,                No sneezing, itching, ear ache, nasal congestion, post nasal drip,   CV:  No chest pain,  Orthopnea, PND, swelling in lower extremities, anasarca, dizziness, palpitations, syncope.   GI  No heartburn, indigestion, abdominal pain, nausea, vomiting, diarrhea, change in  bowel habits, loss of appetite, bloody stools.   Resp:    No chest wall deformity  Skin: no rash or lesions.  GU: no dysuria, change in  color of urine, no urgency or frequency.  No flank pain, no hematuria   MS:  No joint pain or swelling.  No decreased range of motion.  No back pain.  Psych:  No change in mood or affect. No depression or anxiety.  No memory loss.         Objective:   Physical Exam   Filed Vitals:   11/11/15 1628  BP: 140/78  Pulse: 72  Temp: 98.2 F (36.8 C)  Height: 5' 4"  (1.626 m)  Weight: 258 lb (117.028 kg)  SpO2: 97%    GEN: A/Ox3; pleasant , NAD, elderly , obese   HEENT:  [place]/AT,  EACs-clear, TMs-wnl, NOSE-clear, THROAT-clear, no lesions, no postnasal drip or exudate noted.   NECK:  Supple w/ fair ROM; no JVD; normal carotid impulses w/o bruits; no thyromegaly or nodules palpated; no lymphadenopathy.  RESP  Decreased BS in bases .no accessory muscle use, no dullness to percussion  CARD:  RRR, no m/r/g  , tr  peripheral edema, pulses intact, no cyanosis or clubbing.  GI:   Soft & nt; nml bowel sounds; no organomegaly or masses detected.  Musco: Warm bil, no deformities. Arthritic changes in hands   Neuro: alert, no focal deficits noted.    Skin: Warm, no lesions or rashes    CT chest 10/29/15  Stable 7 mm pulmonary nodule in the lingula. Recommend follow-up CT without contrast in 12 months from current month to demonstrate benignity per Fleischner criteria 2. Stable reticular interstitial pattern in the lingula.    Dayjah Selman NP-C  [name]    11/11/15      Assessment & Plan:

## 2015-11-16 DIAGNOSIS — R911 Solitary pulmonary nodule: Secondary | ICD-10-CM | POA: Insufficient documentation

## 2015-11-16 NOTE — Assessment & Plan Note (Signed)
follow up CT in 1 year

## 2015-11-16 NOTE — Assessment & Plan Note (Addendum)
CT chest reviewed with stable interstitial pattern in the lingula  Await PFT to compare with last.  Follow up CT chest in 1 year .  Rheumatology records requested.

## 2015-11-16 NOTE — Addendum Note (Signed)
Addended by: Osa Craver on: 11/16/2015 02:31 PM   Modules accepted: Orders

## 2015-11-16 NOTE — Assessment & Plan Note (Signed)
Continue on CPAP At bedtime   Remain on Oxygen 3l/m . At bedtime  With CPAP  Follow up Dr. Halford Chessman  In 3 months and As needed   Please contact office for sooner follow up if symptoms do not improve or worsen or seek emergency care

## 2015-11-18 DIAGNOSIS — G4733 Obstructive sleep apnea (adult) (pediatric): Secondary | ICD-10-CM | POA: Diagnosis not present

## 2015-11-18 DIAGNOSIS — I509 Heart failure, unspecified: Secondary | ICD-10-CM | POA: Diagnosis not present

## 2015-11-19 ENCOUNTER — Ambulatory Visit (HOSPITAL_COMMUNITY)
Admission: RE | Admit: 2015-11-19 | Discharge: 2015-11-19 | Disposition: A | Discharge status: Home / Self Care | Payer: PPO | Source: Ambulatory Visit | Attending: Pulmonary Disease | Admitting: Pulmonary Disease

## 2015-11-19 DIAGNOSIS — R0602 Shortness of breath: Secondary | ICD-10-CM | POA: Diagnosis not present

## 2015-11-19 DIAGNOSIS — R911 Solitary pulmonary nodule: Secondary | ICD-10-CM | POA: Diagnosis not present

## 2015-11-19 DIAGNOSIS — J849 Interstitial pulmonary disease, unspecified: Secondary | ICD-10-CM | POA: Insufficient documentation

## 2015-11-19 DIAGNOSIS — R0600 Dyspnea, unspecified: Secondary | ICD-10-CM | POA: Insufficient documentation

## 2015-11-19 LAB — PULMONARY FUNCTION TEST
DL/VA % pred: 60 %
DL/VA: 2.89 ml/min/mmHg/L
DLCO unc % pred: 30 %
DLCO unc: 7.33 ml/min/mmHg
FEF 25-75 Post: 2.37 L/sec
FEF 25-75 Pre: 2.11 L/sec
FEF2575-%Change-Post: 12 %
FEF2575-%Pred-Post: 138 %
FEF2575-%Pred-Pre: 123 %
FEV1-%Change-Post: 8 %
FEV1-%Pred-Post: 100 %
FEV1-%Pred-Pre: 92 %
FEV1-Post: 1.85 L
FEV1-Pre: 1.7 L
FEV1FVC-%Change-Post: -1 %
FEV1FVC-%Pred-Pre: 109 %
FEV6-%Change-Post: 9 %
FEV6-%Pred-Post: 96 %
FEV6-%Pred-Pre: 87 %
FEV6-Post: 2.21 L
FEV6-Pre: 2.02 L
FEV6FVC-%Pred-Post: 104 %
FEV6FVC-%Pred-Pre: 104 %
FVC-%Change-Post: 10 %
FVC-%Pred-Post: 93 %
FVC-%Pred-Pre: 84 %
FVC-Post: 2.23 L
FVC-Pre: 2.02 L
Post FEV1/FVC ratio: 83 %
Post FEV6/FVC ratio: 100 %
Pre FEV1/FVC ratio: 84 %
Pre FEV6/FVC Ratio: 100 %
RV % pred: 152 %
RV: 3.32 L
TLC % pred: 107 %
TLC: 5.44 L

## 2015-11-19 MED ORDER — ALBUTEROL SULFATE (2.5 MG/3ML) 0.083% IN NEBU
2.5000 mg | INHALATION_SOLUTION | Freq: Once | RESPIRATORY_TRACT | Status: AC
Start: 2015-11-19 — End: 2015-11-19
  Administered 2015-11-19: 2.5 mg via RESPIRATORY_TRACT

## 2015-11-26 ENCOUNTER — Ambulatory Visit: Payer: PPO | Admitting: Adult Health

## 2015-11-26 DIAGNOSIS — J849 Interstitial pulmonary disease, unspecified: Secondary | ICD-10-CM | POA: Diagnosis not present

## 2015-11-26 DIAGNOSIS — I509 Heart failure, unspecified: Secondary | ICD-10-CM | POA: Diagnosis not present

## 2015-11-27 ENCOUNTER — Other Ambulatory Visit (HOSPITAL_COMMUNITY): Payer: Self-pay | Admitting: Internal Medicine

## 2015-11-29 ENCOUNTER — Telehealth: Payer: Self-pay | Admitting: Pulmonary Disease

## 2015-11-29 NOTE — Telephone Encounter (Signed)
Pt scheduled for 12/16/15 at 11a with VS Nothing further needed.

## 2015-11-29 NOTE — Telephone Encounter (Signed)
PFT 02/04/15 >> FEV1 1.88 (100%), FEV1% 83, TLC 3.79 (75%), DLCO 28%, +BD PFT 11/19/15 >> FEV1 1.85 (100%), FEV1% 83, TLC 5.44 (107%), RV 152%, DLCO 30%, borderline BD   Will have my nurse schedule ROV with me to discuss PFT results.  Okay to double book visit.

## 2015-12-02 DIAGNOSIS — N184 Chronic kidney disease, stage 4 (severe): Secondary | ICD-10-CM | POA: Diagnosis not present

## 2015-12-02 DIAGNOSIS — J449 Chronic obstructive pulmonary disease, unspecified: Secondary | ICD-10-CM | POA: Diagnosis not present

## 2015-12-02 DIAGNOSIS — E1122 Type 2 diabetes mellitus with diabetic chronic kidney disease: Secondary | ICD-10-CM | POA: Diagnosis not present

## 2015-12-02 DIAGNOSIS — I129 Hypertensive chronic kidney disease with stage 1 through stage 4 chronic kidney disease, or unspecified chronic kidney disease: Secondary | ICD-10-CM | POA: Diagnosis not present

## 2015-12-02 DIAGNOSIS — Z1389 Encounter for screening for other disorder: Secondary | ICD-10-CM | POA: Diagnosis not present

## 2015-12-02 DIAGNOSIS — I5032 Chronic diastolic (congestive) heart failure: Secondary | ICD-10-CM | POA: Diagnosis not present

## 2015-12-02 DIAGNOSIS — Z0001 Encounter for general adult medical examination with abnormal findings: Secondary | ICD-10-CM | POA: Diagnosis not present

## 2015-12-03 ENCOUNTER — Ambulatory Visit (INDEPENDENT_AMBULATORY_CARE_PROVIDER_SITE_OTHER): Payer: PPO | Admitting: Ophthalmology

## 2015-12-04 ENCOUNTER — Other Ambulatory Visit (HOSPITAL_COMMUNITY): Payer: Self-pay | Admitting: Internal Medicine

## 2015-12-07 ENCOUNTER — Other Ambulatory Visit (HOSPITAL_COMMUNITY): Payer: Self-pay | Admitting: *Deleted

## 2015-12-07 DIAGNOSIS — I5022 Chronic systolic (congestive) heart failure: Secondary | ICD-10-CM

## 2015-12-07 MED ORDER — DILTIAZEM HCL ER COATED BEADS 180 MG PO CP24: 180.0000 mg | ORAL_CAPSULE | Freq: Every day | ORAL | Status: DC

## 2015-12-08 ENCOUNTER — Other Ambulatory Visit (HOSPITAL_COMMUNITY): Payer: Self-pay | Admitting: *Deleted

## 2015-12-08 DIAGNOSIS — I5022 Chronic systolic (congestive) heart failure: Secondary | ICD-10-CM

## 2015-12-08 MED ORDER — DILTIAZEM HCL ER COATED BEADS 180 MG PO CP24: 180.0000 mg | ORAL_CAPSULE | Freq: Every day | ORAL | Status: DC

## 2015-12-12 ENCOUNTER — Other Ambulatory Visit (HOSPITAL_COMMUNITY): Payer: Self-pay | Admitting: Cardiology

## 2015-12-16 ENCOUNTER — Encounter: Payer: Self-pay | Admitting: Pulmonary Disease

## 2015-12-16 ENCOUNTER — Ambulatory Visit (INDEPENDENT_AMBULATORY_CARE_PROVIDER_SITE_OTHER): Payer: PPO | Admitting: Pulmonary Disease

## 2015-12-16 ENCOUNTER — Ambulatory Visit (INDEPENDENT_AMBULATORY_CARE_PROVIDER_SITE_OTHER)
Admission: RE | Admit: 2015-12-16 | Discharge: 2015-12-16 | Disposition: A | Discharge status: Home / Self Care | Payer: PPO | Source: Ambulatory Visit | Attending: Pulmonary Disease | Admitting: Pulmonary Disease

## 2015-12-16 VITALS — BP 104/70 | HR 59 | Ht 64.0 in | Wt 256.0 lb

## 2015-12-16 DIAGNOSIS — R0600 Dyspnea, unspecified: Secondary | ICD-10-CM | POA: Diagnosis not present

## 2015-12-16 DIAGNOSIS — R05 Cough: Secondary | ICD-10-CM | POA: Diagnosis not present

## 2015-12-16 DIAGNOSIS — J849 Interstitial pulmonary disease, unspecified: Secondary | ICD-10-CM | POA: Diagnosis not present

## 2015-12-16 NOTE — Patient Instructions (Signed)
Chest xray today >> will call with results and discuss whether you need to have bronchoscopy done  Follow up in 2 months with Dr. Halford Chessman or Nurse Practitioner

## 2015-12-16 NOTE — Progress Notes (Signed)
Current Outpatient Prescriptions on File Prior to Visit  Medication Sig  . ACCU-CHEK FASTCLIX LANCETS MISC   . ACCU-CHEK SMARTVIEW test strip   . albuterol (PROVENTIL) (2.5 MG/3ML) 0.083% nebulizer solution Take 2.5 mg by nebulization every 6 (six) hours as needed for shortness of breath. Use four times a day as needed for shortness of breath or wheezing  . amiodarone (PACERONE) 200 MG tablet Take 1 tablet (200 mg total) by mouth daily.  Marland Kitchen amitriptyline (ELAVIL) 25 MG tablet Take 25 mg by mouth at bedtime.    Marland Kitchen apixaban (ELIQUIS) 5 MG TABS tablet Take 1 tablet (5 mg total) by mouth 2 (two) times daily.  Marland Kitchen atorvastatin (LIPITOR) 40 MG tablet TAKE ONE TABLET BY MOUTH ONCE DAILY  . dicyclomine (BENTYL) 10 MG capsule Take 10 mg by mouth daily.   Marland Kitchen diltiazem (CARDIZEM CD) 180 MG 24 hr capsule Take 1 capsule (180 mg total) by mouth daily.  Marland Kitchen esomeprazole (NEXIUM) 40 MG capsule Take 1 capsule (40 mg total) by mouth daily at 12 noon.  . febuxostat (ULORIC) 40 MG tablet Take 40 mg by mouth daily.   . hydrALAZINE (APRESOLINE) 25 MG tablet TAKE ONE TABLET BY MOUTH THREE TIMES DAILY  . HYDROcodone-acetaminophen (NORCO/VICODIN) 5-325 MG per tablet Take 1 tablet by mouth every 4 (four) hours as needed. Reported on 10/22/2015  . insulin glargine (LANTUS) 100 UNIT/ML injection Inject 0.1 mLs (10 Units total) into the skin at bedtime.  . insulin lispro (HUMALOG) 100 UNIT/ML injection Inject 0.2 mLs (20 Units total) into the skin 3 (three) times daily after meals. (Patient taking differently: Inject 50 Units into the skin every morning. )  . levothyroxine (SYNTHROID, LEVOTHROID) 25 MCG tablet Take 1 tablet by mouth daily.  . Linaclotide (LINZESS) 145 MCG CAPS capsule Take 290 mcg by mouth daily. Does 145 mcg most days and then 268mg occ. To aid bowels  . metolazone (ZAROXOLYN) 2.5 MG tablet Take 2.5 mg by mouth as needed.   . metoprolol tartrate (LOPRESSOR) 25 MG tablet TAKE THREE TABLETS BY MOUTH TWICE DAILY  .  nitroGLYCERIN (NITROSTAT) 0.4 MG SL tablet Place 0.4 mg under the tongue every 5 (five) minutes as needed for chest pain. Reported on 10/22/2015  . OXYGEN Inhale 3 L/min into the lungs continuous.  . polyethylene glycol (MIRALAX / GLYCOLAX) packet Take 17 g by mouth daily.  . potassium chloride SA (K-DUR,KLOR-CON) 20 MEQ tablet Take 4 tabs in AM and 3 tabs in PM  . torsemide (DEMADEX) 100 MG tablet Take 100 mg by mouth 2 (two) times daily.  . Vitamin D, Ergocalciferol, (DRISDOL) 50000 UNITS CAPS capsule Take 50,000 Units by mouth every Monday.  .Penne LashHFA 45 MCG/ACT inhaler Inhale 2 puffs into the lungs every 4 (four) hours as needed for wheezing or shortness of breath.  . zolpidem (AMBIEN) 10 MG tablet Take 10 mg by mouth at bedtime.    No current facility-administered medications on file prior to visit.     Chief Complaint  Patient presents with  . Follow-up    Review PFT. C/o increased SOB since last OV with cough, wheezing and chest tightness. Pt notes increased amounts of hiccuping also.     Tests Echo 03/06/14 >> mild LVH, EF 60 to 65% Esophagram 04/24/14 >> severe esophageal dysmotility, esophageal stasis, small HH PFT 02/04/15 >> FEV1 1.88 (100%), FEV1% 83, TLC 3.79 (75%), DLCO 28%, +BD Serology 02/16/15 >> anti CCP negative, RF 23, anti Jo negative, Scl 70 negative, SSa/SSb negative,  ANA negative, ESR 48 Echo 02/17/15 >> EF 60 to 34%, grade 2 diastolic dysfx, PAS 38 mmHg HR CT chest 03/26/15 >> patchy GGO with areas of btx, air trapping, 7 mm nodule in lingula PFT 11/19/15 >> FEV1 1.85 (100%), FEV1% 83, TLC 5.44 (107%), RV 152%, DLCO 30%, borderline BD  Past medical hx A flutter/fib, HTN, Diastolic CHF, HLD, CAD s/p stent, Fatty liver, GERD, CKD  Past surgical hx, Allergies, Family hx, Social hx all reviewed.  Vital Signs BP 104/70 mmHg  Pulse 59  Ht 5' 4"  (1.626 m)  Wt 256 lb (116.121 kg)  BMI 43.92 kg/m2  SpO2 90%  History of Present Illness [name] is a 70  y.o. female former smoker dyspnea.  She continues to be short of breath with any activity.  She has dry cough and gets sweats.  She denies wheeze, chest pain, or skin rash.  She was seen by Dr. Amil Amen > had steroid taper.  This helped her joints, but no difference with breathing.  Her PFT from March showed persistent severe diffusion defect.  She continues to use oxygen with exertion and sleep.  Her son is getting married in July in the [place].  She is concerned about how she will be able to travel with oxygen.  Physical Exam  General - No distress ENT - No sinus tenderness, no oral exudate, no LAN Cardiac - s1s2 regular, no murmur Chest - No wheeze/rales/dullness Back - No focal tenderness Abd - Soft, non-tender Ext - No edema Neuro - Normal strength Skin - No rashes Psych - normal mood, and behavior   Assessment/Plan  Dyspnea, cough with ILD. No improvement after recent prednisone therapy from rheumatology Plan: - repeat CXR today - will then decide about bronchoscopy  Chronic hypoxic respiratory failure. - advised her to d/w her DME and airline about what is required for her to travel with supplemental oxygen for her son's wedding  Lung nodule. Plan: - f/u CT chest w/o contrast in February 2018  A fib/flutter. Diastolic CHF. Plan: - per cardiology - she is on amiodarone >> might need to have this d/c'ed if no other explanation for her dyspnea and ILD   Patient Instructions  Chest xray today >> will call with results and discuss whether you need to have bronchoscopy done  Follow up in 2 months with Dr. Halford Chessman or Nurse Practitioner     Chesley Mires, MD [place] [facility]/Sleep Pager:  [phone] 12/16/2015, 11:42 AM

## 2015-12-19 DIAGNOSIS — I509 Heart failure, unspecified: Secondary | ICD-10-CM | POA: Diagnosis not present

## 2015-12-19 DIAGNOSIS — G4733 Obstructive sleep apnea (adult) (pediatric): Secondary | ICD-10-CM | POA: Diagnosis not present

## 2015-12-20 ENCOUNTER — Telehealth: Payer: Self-pay | Admitting: Pulmonary Disease

## 2015-12-20 DIAGNOSIS — R0602 Shortness of breath: Secondary | ICD-10-CM | POA: Diagnosis not present

## 2015-12-20 NOTE — Telephone Encounter (Signed)
Dg Chest 2 View  12/16/2015  CLINICAL DATA:  Chronic cough. EXAM: CHEST  2 VIEW COMPARISON:  CT 10/29/2015.  02/16/2015 .  01/18/2015 FINDINGS: Cardiac pacer with lead tips in right atrium right ventricle. Heart size normal. Mild bilateral from interstitial prominence again noted. Findings consistent chronic interstitial lung disease. Previously identified pulmonary nodule best identified by prior CT. No pleural effusion or pneumothorax. IMPRESSION: 1. Cardiac pacer in stable position. 2. Mild bilateral pulmonary interstitial prominence again noted suggesting mild chronic interstitial lung disease. Reference made to prior CT report 10/29/2015. Electronically Signed   By: Marcello Moores  Register   On: 12/16/2015 13:59    Results d/w pt over phone.  She will need bronchoscopy with airway inspection >> this was reviewed in detail at her last ROV on 12/16/15.  I have scheduled her for bronchoscopy at [facility] on Thursday, 12/23/15 at 8 am.  Advised her to stay NPO after midnight, and to arrange for transportation to/from hospital.

## 2015-12-23 ENCOUNTER — Encounter (HOSPITAL_COMMUNITY)
Admission: RE | Disposition: A | Discharge status: Home / Self Care | Payer: Self-pay | Source: Ambulatory Visit | Attending: Pulmonary Disease

## 2015-12-23 ENCOUNTER — Ambulatory Visit (HOSPITAL_COMMUNITY)
Admission: RE | Admit: 2015-12-23 | Discharge: 2015-12-23 | Disposition: A | Discharge status: Home / Self Care | Payer: PPO | Source: Ambulatory Visit | Attending: Pulmonary Disease | Admitting: Pulmonary Disease

## 2015-12-23 DIAGNOSIS — I481 Persistent atrial fibrillation: Secondary | ICD-10-CM | POA: Diagnosis not present

## 2015-12-23 DIAGNOSIS — M109 Gout, unspecified: Secondary | ICD-10-CM | POA: Insufficient documentation

## 2015-12-23 DIAGNOSIS — J9809 Other diseases of bronchus, not elsewhere classified: Secondary | ICD-10-CM | POA: Diagnosis not present

## 2015-12-23 DIAGNOSIS — R0600 Dyspnea, unspecified: Secondary | ICD-10-CM | POA: Diagnosis not present

## 2015-12-23 DIAGNOSIS — I495 Sick sinus syndrome: Secondary | ICD-10-CM | POA: Diagnosis not present

## 2015-12-23 DIAGNOSIS — Z950 Presence of cardiac pacemaker: Secondary | ICD-10-CM | POA: Insufficient documentation

## 2015-12-23 DIAGNOSIS — M1990 Unspecified osteoarthritis, unspecified site: Secondary | ICD-10-CM | POA: Insufficient documentation

## 2015-12-23 DIAGNOSIS — G4730 Sleep apnea, unspecified: Secondary | ICD-10-CM | POA: Diagnosis not present

## 2015-12-23 DIAGNOSIS — Z79899 Other long term (current) drug therapy: Secondary | ICD-10-CM | POA: Insufficient documentation

## 2015-12-23 DIAGNOSIS — I1310 Hypertensive heart and chronic kidney disease without heart failure, with stage 1 through stage 4 chronic kidney disease, or unspecified chronic kidney disease: Secondary | ICD-10-CM | POA: Insufficient documentation

## 2015-12-23 DIAGNOSIS — I252 Old myocardial infarction: Secondary | ICD-10-CM | POA: Insufficient documentation

## 2015-12-23 DIAGNOSIS — R05 Cough: Secondary | ICD-10-CM | POA: Diagnosis not present

## 2015-12-23 DIAGNOSIS — N189 Chronic kidney disease, unspecified: Secondary | ICD-10-CM | POA: Insufficient documentation

## 2015-12-23 DIAGNOSIS — E1151 Type 2 diabetes mellitus with diabetic peripheral angiopathy without gangrene: Secondary | ICD-10-CM | POA: Diagnosis not present

## 2015-12-23 DIAGNOSIS — Z955 Presence of coronary angioplasty implant and graft: Secondary | ICD-10-CM | POA: Diagnosis not present

## 2015-12-23 DIAGNOSIS — R053 Chronic cough: Secondary | ICD-10-CM | POA: Insufficient documentation

## 2015-12-23 DIAGNOSIS — I5032 Chronic diastolic (congestive) heart failure: Secondary | ICD-10-CM | POA: Diagnosis not present

## 2015-12-23 DIAGNOSIS — R0602 Shortness of breath: Secondary | ICD-10-CM | POA: Diagnosis not present

## 2015-12-23 DIAGNOSIS — I2510 Atherosclerotic heart disease of native coronary artery without angina pectoris: Secondary | ICD-10-CM | POA: Insufficient documentation

## 2015-12-23 DIAGNOSIS — E1122 Type 2 diabetes mellitus with diabetic chronic kidney disease: Secondary | ICD-10-CM | POA: Insufficient documentation

## 2015-12-23 DIAGNOSIS — Z79891 Long term (current) use of opiate analgesic: Secondary | ICD-10-CM | POA: Diagnosis not present

## 2015-12-23 DIAGNOSIS — J849 Interstitial pulmonary disease, unspecified: Secondary | ICD-10-CM | POA: Diagnosis not present

## 2015-12-23 DIAGNOSIS — Z87891 Personal history of nicotine dependence: Secondary | ICD-10-CM | POA: Diagnosis not present

## 2015-12-23 DIAGNOSIS — Z794 Long term (current) use of insulin: Secondary | ICD-10-CM | POA: Insufficient documentation

## 2015-12-23 HISTORY — PX: VIDEO BRONCHOSCOPY: SHX5072

## 2015-12-23 LAB — BODY FLUID CELL COUNT WITH DIFFERENTIAL
Eos, Fluid: 4 %
Lymphs, Fluid: 8 %
Monocyte-Macrophage-Serous Fluid: 39 % — ABNORMAL LOW (ref 50–90)
Neutrophil Count, Fluid: 49 % — ABNORMAL HIGH (ref 0–25)
Total Nucleated Cell Count, Fluid: 23 cu mm (ref 0–1000)

## 2015-12-23 LAB — GLUCOSE, CAPILLARY: Glucose-Capillary: 119 mg/dL — ABNORMAL HIGH (ref 65–99)

## 2015-12-23 SURGERY — BRONCHOSCOPY, WITH FLUOROSCOPY
Anesthesia: Moderate Sedation | Laterality: Bilateral

## 2015-12-23 MED ORDER — BUTAMBEN-TETRACAINE-BENZOCAINE 2-2-14 % EX AERO: 1.0000 | INHALATION_SPRAY | Freq: Once | CUTANEOUS | Status: DC

## 2015-12-23 MED ORDER — SODIUM CHLORIDE 0.9 % IV SOLN
INTRAVENOUS | Status: DC
  Administered 2015-12-23: 08:00:00 via INTRAVENOUS

## 2015-12-23 MED ORDER — LIDOCAINE HCL 2 % EX GEL: 1.0000 "application " | Freq: Once | CUTANEOUS | Status: DC

## 2015-12-23 MED ORDER — MIDAZOLAM HCL 5 MG/ML IJ SOLN
INTRAMUSCULAR | Status: AC
  Filled 2015-12-23: qty 2

## 2015-12-23 MED ORDER — FENTANYL CITRATE (PF) 100 MCG/2ML IJ SOLN
INTRAMUSCULAR | Status: AC
  Filled 2015-12-23: qty 4

## 2015-12-23 MED ORDER — MIDAZOLAM HCL 10 MG/2ML IJ SOLN
INTRAMUSCULAR | Status: DC | PRN
  Administered 2015-12-23 (×2): 2 mg via INTRAVENOUS

## 2015-12-23 MED ORDER — FENTANYL CITRATE (PF) 100 MCG/2ML IJ SOLN
INTRAMUSCULAR | Status: DC | PRN
  Administered 2015-12-23 (×2): 50 ug via INTRAVENOUS

## 2015-12-23 MED ORDER — LIDOCAINE HCL 1 % IJ SOLN
INTRAMUSCULAR | Status: DC | PRN
  Administered 2015-12-23: 6 mL via RESPIRATORY_TRACT

## 2015-12-23 MED ORDER — PHENYLEPHRINE HCL 0.25 % NA SOLN: 1.0000 | Freq: Four times a day (QID) | NASAL | Status: DC | PRN

## 2015-12-23 NOTE — Op Note (Signed)
[facility] Cardiopulmonary Patient Name: [name] Procedure Date: 12/23/2015 MRN: [id number] Attending MD: Chesley Mires , MD Date of Birth: [birth year] CSN: [id number] Age: 70 Admit Type: Outpatient Ethnicity: Not Hispanic or Latino Procedure:            Bronchoscopy Indications:          Interstitial lung disease, Chronic cough with abnormal                        CT, Tracheobronchomalacia, Shortness of breath Providers:            Chesley Mires, MD, Doris Cheadle RRT,RCP, Ashley Mariner                        RRT,RCP Referring MD:          Medicines:            Midazolam 4 mg IV, Fentanyl 100 mcg IV, Lidocaine 1%                        applied to cords 4 mL, Lidocaine 1% applied to the                        tracheobronchial tree 2 mL Complications:        No immediate complications Estimated Blood Loss: Estimated blood loss: none. Procedure:      Pre-Anesthesia Assessment:      - A History and Physical has been performed. Patient meds and allergies       have been reviewed. The risks and benefits of the procedure and the       sedation options and risks were discussed with the patient. All       questions were answered and informed consent was obtained. Patient       identification and proposed procedure were verified prior to the       procedure. Mental Status Examination: alert and oriented. Respiratory       Examination: clear to auscultation. ASA Grade Assessment: II - A patient       with mild systemic disease. After reviewing the risks and benefits, the       patient was deemed in satisfactory condition to undergo the procedure.       The anesthesia plan was to use moderate sedation / analgesia (conscious       sedation). Immediately prior to administration of medications, the       patient was re-assessed for adequacy to receive sedatives. The heart       rate, respiratory rate, oxygen saturations, blood pressure, adequacy of       pulmonary ventilation,  and response to care were monitored throughout       the procedure. The physical status of the patient was re-assessed after       the procedure.      After obtaining informed consent, the bronchoscope was passed under       direct vision. Throughout the procedure, the patient's blood pressure,       pulse, and oxygen saturations were monitored continuously. the [id number]       [id number]) scope was introduced through the and advanced to the. The       procedure was accomplished without difficulty. The patient tolerated the       procedure well. The patient tolerated the procedure well. The total  duration of the procedure was 24 minutes. Findings:      The oropharynx appears normal. The larynx appears normal. The vocal       cords appear normal. The subglottic space is normal. The trachea is of       normal caliber. The carina is sharp. The tracheobronchial tree was       examined to at least the first subsegmental level. Bronchial mucosa and       anatomy are normal; there are no endobronchial lesions, and no       secretions.      Bilateral Lung Abnormalities: Partially obstructing (about 80%       obstructed) bronchomalacia was found throughout the tracheobronchial       tree. BAL was performed in the LUL apical posterior segments (B1 & B2)       of the lung and sent for. 60 mL of fluid were instilled. 25 mL were       returned. 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      - Bronchoalveolar lavage was performed. Moderate Sedation:      Moderate (conscious) sedation was personally administered by the       endoscopist. The following parameters were monitored: oxygen saturation,       heart rate, blood pressure, respiratory rate, EKG, adequacy of pulmonary       ventilation, and response to care. Total physician intraservice time was       24 minutes. Recommendation:      - Await BAL and cytology results.      - Discharge patient to  home (ambulatory).      - Follow up with bronchoscopist as previously scheduled. Procedure Code(s):      --- Professional ---      [phone], Bronchoscopy, rigid or flexible, including fluoroscopic guidance,       when performed; with bronchial alveolar lavage      99152, Moderate sedation services provided by the same physician or       other qualified health care professional performing the diagnostic or       therapeutic service that the sedation supports, requiring the presence       of an independent trained observer to assist in the monitoring of the       patient's level of consciousness and physiological status; initial 15       minutes of intraservice time, patient age 72 years or older      [phone], Moderate sedation services; each additional 15 minutes       intraservice time Diagnosis Code(s):      --- Professional ---      J98.09, Other diseases of bronchus, not elsewhere classified      J84.9, Interstitial pulmonary disease, unspecified      R06.00, Dyspnea, unspecified      R05, Cough CPT copyright 2016 American Medical Association. All rights reserved. The codes documented in this report are preliminary and upon coder review may  be revised to meet current compliance requirements. Chesley Mires, MD Chesley Mires, MD 12/23/2015 9:11:31 AM This report has been signed electronically. Number of Addenda: 0 Scope In: 8:30:47 AM Scope Out: 8:35:45 AM

## 2015-12-23 NOTE — Discharge Instructions (Signed)
Flexible Bronchoscopy, Care After These instructions give you information on caring for yourself after your procedure. Your doctor may also give you more specific instructions. Call your doctor if you have any problems or questions after your procedure. HOME CARE  Do not eat or drink anything for 2 hours after your procedure. If you try to eat or drink before the medicine wears off, food or drink could go into your lungs. You could also burn yourself.  After 2 hours have passed and when you can cough and gag normally, you may eat soft food and drink liquids slowly.  The day after the test, you may eat your normal diet.  You may do your normal activities.  Keep all doctor visits. GET HELP RIGHT AWAY IF:  You get more and more short of breath.  You get light-headed.  You feel like you are going to pass out (faint).  You have chest pain.  You have new problems that worry you.  You cough up more than a little blood.  You cough up more blood than before. MAKE SURE YOU:  Understand these instructions.  Will watch your condition.  Will get help right away if you are not doing well or get worse.   This information is not intended to replace advice given to you by your health care provider. Make sure you discuss any questions you have with your health care provider.   Document Released: 06/18/2009 Document Revised: 08/26/2013 Document Reviewed: 04/25/2013 Elsevier Interactive Patient Education 2016 [place] not eat or drink until after 1030 today 12/23/15

## 2015-12-23 NOTE — H&P (Signed)
PCCM History and Physical  Chief complaint Short of Breath  History of present illness 70 yo female former smoker with progressive dyspnea.  She has persistent left > right pulmonary infiltrates with concern for ILD.  She has been evaluated by rheumatology and tried on prednisone taper w/o improvement.  Recent PFT showed severe diffusion defect.  She is scheduled for bronchoscopy with airway sampling to further assess.  Past medical history She  has a past medical history of Atrial flutter ([place]); Hypertension; IDDM (insulin dependent diabetes mellitus) ([place]); Diastolic heart failure ([place]); Asthma; Hyperlipidemia; Fatty liver (2008); Esophageal dysmotility (2008); Arthritis; Sleep apnea; Fatty tumor (fatty tumor back); Coronary atherosclerosis of native coronary artery; Morbid obesity ([place]); Myocardial infarction [facility]) (2009); Heart murmur; Peripheral vascular disease ([place]); GERD (gastroesophageal reflux disease) (2012); Anginal pain ([place]); CKD (chronic kidney disease) (04/2007); Complication of anesthesia; Persistent atrial fibrillation ([place]); Sick sinus syndrome ([place]); and Gouty arthropathy.   Past surgical history She  has past surgical history that includes Coronary angioplasty with stent; Breast lumpectomy; Tubal ligation; Tonsillectomy; Total knee arthroplasty (Right, 12/15/2013); Cardioversion (N/A, 03/03/2014); Atrial flutter ablation (2008); Pacemaker insertion (06/18/14); right heart catheterization (N/A, 06/12/2014); permanent pacemaker insertion (N/A, 06/18/2014); and Colonoscopy with propofol (N/A, 02/18/2015).  Social history She  reports that she quit smoking about 20 years ago. Her smoking use included Cigarettes. She has a 17.5 pack-year smoking history. She has never used smokeless tobacco. She reports that she does not drink alcohol or use illicit drugs.  Family history Her family history includes Bladder Cancer in her mother; Colon cancer in her maternal aunt; Heart disease in her  mother; Kidney disease in her mother; Ovarian cancer in her daughter; Stomach cancer in her maternal uncle. There is no history of Esophageal cancer.  Allergies Allergen Reactions  . Adhesive [Tape] Itching    EKG leads  . Sulfa Antibiotics Itching and Nausea And Vomiting    everything I seen was red  . Codeine Nausea And Vomiting  . Penicillins Nausea And Vomiting   Medications Medication Sig  . ACCU-CHEK FASTCLIX LANCETS MISC   . ACCU-CHEK SMARTVIEW test strip   . albuterol (PROVENTIL) (2.5 MG/3ML) 0.083% nebulizer solution Take 2.5 mg by nebulization every 6 (six) hours as needed for shortness of breath. Use four times a day as needed for shortness of breath or wheezing  . amiodarone (PACERONE) 200 MG tablet Take 1 tablet (200 mg total) by mouth daily.  Marland Kitchen amitriptyline (ELAVIL) 25 MG tablet Take 25 mg by mouth at bedtime.    Marland Kitchen apixaban (ELIQUIS) 5 MG TABS tablet Take 1 tablet (5 mg total) by mouth 2 (two) times daily.  Marland Kitchen atorvastatin (LIPITOR) 40 MG tablet TAKE ONE TABLET BY MOUTH ONCE DAILY  . dicyclomine (BENTYL) 10 MG capsule Take 10 mg by mouth daily.   Marland Kitchen diltiazem (CARDIZEM CD) 180 MG 24 hr capsule Take 1 capsule (180 mg total) by mouth daily.  Marland Kitchen esomeprazole (NEXIUM) 40 MG capsule Take 1 capsule (40 mg total) by mouth daily at 12 noon.  . febuxostat (ULORIC) 40 MG tablet Take 40 mg by mouth daily.   . hydrALAZINE (APRESOLINE) 25 MG tablet TAKE ONE TABLET BY MOUTH THREE TIMES DAILY  . HYDROcodone-acetaminophen (NORCO/VICODIN) 5-325 MG per tablet Take 1 tablet by mouth every 4 (four) hours as needed. Reported on 10/22/2015  . insulin glargine (LANTUS) 100 UNIT/ML injection Inject 0.1 mLs (10 Units total) into the skin at bedtime.  . insulin lispro (HUMALOG) 100 UNIT/ML injection Inject 0.2 mLs (20  Units total) into the skin 3 (three) times daily after meals. (Patient taking differently: Inject 50 Units into the skin every morning. )  . levothyroxine (SYNTHROID, LEVOTHROID) 25 MCG  tablet Take 1 tablet by mouth daily.  . Linaclotide (LINZESS) 145 MCG CAPS capsule Take 290 mcg by mouth daily. Does 145 mcg most days and then 261mcg occ. To aid bowels  . metolazone (ZAROXOLYN) 2.5 MG tablet Take 2.5 mg by mouth as needed.   . metoprolol tartrate (LOPRESSOR) 25 MG tablet TAKE THREE TABLETS BY MOUTH TWICE DAILY  . nitroGLYCERIN (NITROSTAT) 0.4 MG SL tablet Place 0.4 mg under the tongue every 5 (five) minutes as needed for chest pain. Reported on 10/22/2015  . OXYGEN Inhale 3 L/min into the lungs continuous.  . polyethylene glycol (MIRALAX / GLYCOLAX) packet Take 17 g by mouth daily.  . potassium chloride SA (K-DUR,KLOR-CON) 20 MEQ tablet Take 4 tabs in AM and 3 tabs in PM  . torsemide (DEMADEX) 100 MG tablet Take 100 mg by mouth 2 (two) times daily.  . Vitamin D, Ergocalciferol, (DRISDOL) 50000 UNITS CAPS capsule Take 50,000 Units by mouth every Monday.  Penne Lash HFA 45 MCG/ACT inhaler Inhale 2 puffs into the lungs every 4 (four) hours as needed for wheezing or shortness of breath.  . zolpidem (AMBIEN) 10 MG tablet Take 10 mg by mouth at bedtime.    ROS Negative except above  Vital signs Temp(Src) 98 F (36.7 C) (Oral)  Resp 12  SpO2 96%  Physical exam General: well developed Neurology: CN intact, normal strength HEENT: pupils reactive, MP 4, no LAN Cardiac: regular, no murmur Chest: no wheeze/rales Abd: soft, non tender Ext: no edema Skin: no rashes  CMP Latest Ref Rng 10/22/2015 04/23/2015 03/19/2015  Glucose 65 - 99 mg/dL 203(H) 253(H) 367(H)  BUN 6 - 20 mg/dL 50(H) 41(H) 48(H)  Creatinine 0.44 - 1.00 mg/dL 3.22(H) 3.42(H) 3.31(H)  Sodium 135 - 145 mmol/L 142 137 139  Potassium 3.5 - 5.1 mmol/L 3.0(L) 3.5 3.4(L)  Chloride 101 - 111 mmol/L 100(L) 99(L) 98(L)  CO2 22 - 32 mmol/L 25 25 26   Calcium 8.9 - 10.3 mg/dL 9.5 9.5 10.0  Total Protein 6.5 - 8.1 g/dL 7.2 - 7.5  Total Bilirubin 0.3 - 1.2 mg/dL 0.5 - 0.5  Alkaline Phos 38 - 126 U/L 70 - 70  AST 15 - 41  U/L 28 - 33  ALT 14 - 54 U/L 21 - 23    CBC Latest Ref Rng 10/22/2015 07/09/2015 03/26/2015  WBC 4.0 - 10.5 K/uL 6.6 7.0 7.5  Hemoglobin 12.0 - 15.0 g/dL 15.8(H) 14.9 13.4  Hematocrit 36.0 - 46.0 % 46.9(H) 45.1 42.4  Platelets 150 - 400 K/uL 181 163.0 212.0    Dg Chest 2 View  12/16/2015  CLINICAL DATA:  Chronic cough. EXAM: CHEST  2 VIEW COMPARISON:  CT 10/29/2015.  02/16/2015 .  01/18/2015 FINDINGS: Cardiac pacer with lead tips in right atrium right ventricle. Heart size normal. Mild bilateral from interstitial prominence again noted. Findings consistent chronic interstitial lung disease. Previously identified pulmonary nodule best identified by prior CT. No pleural effusion or pneumothorax. IMPRESSION: 1. Cardiac pacer in stable position. 2. Mild bilateral pulmonary interstitial prominence again noted suggesting mild chronic interstitial lung disease. Reference made to prior CT report 10/29/2015. Electronically Signed   By: Marcello Moores  Register   On: 12/16/2015 13:59   Assessment/plan:  Progressive dyspnea and cough with concern for ILD. - proceed with bronchoscopy.  Risks detailed as bleeding, infection,  pneumothorax, and non diagnosis.  Alternative procedures discussed.  Chesley Mires, MD [facility] Pulmonary/Critical Care 12/23/2015, 7:43 AM Pager:  [phone] After 3pm call: [phone]

## 2015-12-23 NOTE — Progress Notes (Signed)
Video bronchoscopy performed Intervention bronchial washings Pt tolerated well  Leiliana Foody David RRT  

## 2015-12-25 LAB — ACID FAST SMEAR (AFB, MYCOBACTERIA): Acid Fast Smear: NEGATIVE

## 2015-12-26 LAB — CULTURE, BAL-QUANTITATIVE W GRAM STAIN
Culture: NO GROWTH
Gram Stain: NONE SEEN

## 2015-12-27 DIAGNOSIS — I509 Heart failure, unspecified: Secondary | ICD-10-CM | POA: Diagnosis not present

## 2015-12-28 ENCOUNTER — Encounter (HOSPITAL_COMMUNITY): Payer: Self-pay | Admitting: Pulmonary Disease

## 2015-12-30 ENCOUNTER — Telehealth: Payer: Self-pay | Admitting: Pulmonary Disease

## 2015-12-30 DIAGNOSIS — G4733 Obstructive sleep apnea (adult) (pediatric): Secondary | ICD-10-CM

## 2015-12-30 DIAGNOSIS — J849 Interstitial pulmonary disease, unspecified: Secondary | ICD-10-CM

## 2015-12-30 DIAGNOSIS — J9611 Chronic respiratory failure with hypoxia: Secondary | ICD-10-CM

## 2015-12-30 NOTE — Telephone Encounter (Signed)
Spoke with the pt  She is requesting results of Bronch  Please advise, thanks!

## 2015-12-31 ENCOUNTER — Ambulatory Visit (INDEPENDENT_AMBULATORY_CARE_PROVIDER_SITE_OTHER): Payer: PPO | Admitting: Ophthalmology

## 2015-12-31 DIAGNOSIS — H35033 Hypertensive retinopathy, bilateral: Secondary | ICD-10-CM

## 2015-12-31 DIAGNOSIS — I10 Essential (primary) hypertension: Secondary | ICD-10-CM | POA: Diagnosis not present

## 2015-12-31 DIAGNOSIS — E103293 Type 1 diabetes mellitus with mild nonproliferative diabetic retinopathy without macular edema, bilateral: Secondary | ICD-10-CM

## 2015-12-31 DIAGNOSIS — E11319 Type 2 diabetes mellitus with unspecified diabetic retinopathy without macular edema: Secondary | ICD-10-CM

## 2015-12-31 NOTE — Telephone Encounter (Signed)
Results d/w pt.  Explained preliminary culture results were negative, and cytology results were negative.  She did have significant bronchomalacia.  She has been on CPAP and uses [facility].  She has been followed by GI for reflux, and feels her reflux continues to be an issue.  She sleeps on her left side almost exclusively.  Most of her lung disease is in her left lung.  I explained that she could be having reflux that is causing her lung findings.   Will arrange for ONO with CPAP and 2 liters oxygen.  Will get copy of her CPAP download.  She might need f/u with GI to determine status of her reflux.  Will have my nurse schedule her for ROV in 1 to 2 weeks with me or nurse practitioner.

## 2015-12-31 NOTE — Telephone Encounter (Signed)
Routed to [facility] for follow up per VS.

## 2015-12-31 NOTE — Telephone Encounter (Signed)
Dr Halford Chessman has already placed order for ONO - pt aware Order placed for CPAP download by VS - pt aware Pt scheduled for 2 mo ROV with TP 01/14/16 at 2:30pm Nothing further needed.

## 2016-01-01 ENCOUNTER — Other Ambulatory Visit (HOSPITAL_COMMUNITY): Payer: Self-pay | Admitting: Cardiology

## 2016-01-05 NOTE — Progress Notes (Signed)
Electrophysiology Office Note Date: 01/06/2016  ID:  [name], DOB Feb 01, 1946, MRN [id number]  PCP: [name], MD Primary Cardiologist: Aundra Dubin Electrophysiologist: Allred  CC: Pacemaker follow-up  [name] is a 70 y.o. female seen today for Dr Rayann Heman.  She presents today for routine electrophysiology followup.  Since last being seen in our clinic, the patient reports that she has been stable.  She has persistent shortness of breath and is undergoing pulmonary evaluation. She wears O2 and is complaint with CPAP.  She denies chest pain, palpitations, nausea, vomiting, dizziness, syncope, edema, weight gain, or early satiety.  Device History: STJ dual chamber PPM implanted 2015 for SSS   Past Medical History  Diagnosis Date  . Atrial flutter ([place])     ablated by Dr Lovena Le in 2008  . Hypertension   . IDDM (insulin dependent diabetes mellitus) ([place])     type 2.   . Diastolic heart failure 0000000    grade 2 diastolic dysfunction per [id number] echo  . Asthma   . Hyperlipidemia   . Fatty liver 2008    noted on ultrasound 2008  . Esophageal dysmotility 2008    noted on esophagram.  hx dysphagia.   . Arthritis   . Sleep apnea     wears CPAP  . Fatty tumor fatty tumor back  . Coronary atherosclerosis of native coronary artery   . Morbid obesity ([place])   . Myocardial infarction ([place]) 2009  . Heart murmur   . Peripheral vascular disease ([place])   . GERD (gastroesophageal reflux disease) 2012    Barrets esophagus on bx 2012 and 2014.   . Anginal pain ([place])     occ; non-ischemic Lexiscan 09/2012  . CKD (chronic kidney disease) 04/2007    CKD stage 4(Dr. Erling Cruz)  . Complication of anesthesia      DIFFICULTY BREATHING   . Persistent atrial fibrillation (HCC)     chads2 vasc score of at least 5  . Sick sinus syndrome ([place])   . Gouty arthropathy    Past Surgical History  Procedure Laterality Date  . Coronary angioplasty with stent placement    . Breast  lumpectomy      right  . Tubal ligation    . Tonsillectomy    . Total knee arthroplasty Right 12/15/2013    Procedure: RIGHT TOTAL KNEE ARTHROPLASTY;  Surgeon: Alta Corning, MD;  Location: [facility];  Service: Orthopedics;  Laterality: Right;  . Cardioversion N/A 03/03/2014    Procedure: CARDIOVERSION;  Surgeon: Laverda Page, MD;  Location: [facility];  Service: Cardiovascular;  Laterality: N/A;  . Atrial flutter ablation  2008    CTI ablation by Dr Lovena Le  . Pacemaker insertion  06/18/14    STJ Assurity dual chamber pacemaker implanted by Dr Rayann Heman  . Right heart catheterization N/A 06/12/2014    Procedure: RIGHT HEART CATH;  Surgeon: Jolaine Artist, MD;  Location: [facility] CATH LAB;  Service: Cardiovascular;  Laterality: N/A;  . Permanent pacemaker insertion N/A 06/18/2014    SJM Assurity DR pacemaker implanted by Dr Rayann Heman for sick sinus syndrome  . Colonoscopy with propofol N/A 02/18/2015    Procedure: COLONOSCOPY WITH PROPOFOL;  Surgeon: Milus Banister, MD;  Location: [facility];  Service: Endoscopy;  Laterality: N/A;  . Video bronchoscopy Bilateral 12/23/2015    Procedure: VIDEO BRONCHOSCOPY WITH FLUORO;  Surgeon: Chesley Mires, MD;  Location: [facility] ENDOSCOPY;  Service: Cardiopulmonary;  Laterality: Bilateral;    Current Outpatient Prescriptions  Medication  Sig Dispense Refill  . albuterol (PROVENTIL) (2.5 MG/3ML) 0.083% nebulizer solution Take 2.5 mg by nebulization every 6 (six) hours as needed for shortness of breath. Use four times a day as needed for shortness of breath or wheezing    . amiodarone (PACERONE) 200 MG tablet TAKE ONE TABLET BY MOUTH ONCE DAILY 30 tablet 3  . amitriptyline (ELAVIL) 25 MG tablet Take 25 mg by mouth at bedtime.      Marland Kitchen apixaban (ELIQUIS) 5 MG TABS tablet Take 1 tablet (5 mg total) by mouth 2 (two) times daily. 60 tablet 3  . atorvastatin (LIPITOR) 40 MG tablet TAKE ONE TABLET BY MOUTH ONCE DAILY 90 tablet 0  . dicyclomine (BENTYL) 10 MG capsule Take 10 mg  by mouth daily.     Marland Kitchen diltiazem (CARDIZEM CD) 180 MG 24 hr capsule Take 1 capsule (180 mg total) by mouth daily. 30 capsule 3  . esomeprazole (NEXIUM) 40 MG capsule Take 1 capsule (40 mg total) by mouth daily at 12 noon. 30 capsule 0  . hydrALAZINE (APRESOLINE) 25 MG tablet TAKE ONE TABLET BY MOUTH THREE TIMES DAILY 90 tablet 3  . insulin glargine (LANTUS) 100 UNIT/ML injection Inject 50 Units into the skin at bedtime.    . insulin lispro (HUMALOG) 100 UNIT/ML injection Inject 50 Units into the skin.    Marland Kitchen levothyroxine (SYNTHROID, LEVOTHROID) 25 MCG tablet Take 1 tablet by mouth daily.    . Linaclotide (LINZESS) 145 MCG CAPS capsule Take 290 mcg by mouth daily. Does 145 mcg most days and then 260mcg occ. To aid bowels    . metolazone (ZAROXOLYN) 2.5 MG tablet Take 2.5 mg by mouth as needed.     . metoprolol tartrate (LOPRESSOR) 25 MG tablet TAKE THREE TABLETS BY MOUTH TWICE DAILY 180 tablet 0  . nitroGLYCERIN (NITROSTAT) 0.4 MG SL tablet Place 0.4 mg under the tongue every 5 (five) minutes as needed for chest pain (x 3 doses). Reported on 10/22/2015    . OXYGEN Inhale 3 L/min into the lungs continuous. Patient uses nightly    . potassium chloride SA (K-DUR,KLOR-CON) 20 MEQ tablet Take by mouth as directed. Take 4 tabs in AM and 3 tabs in PM    . torsemide (DEMADEX) 100 MG tablet Take 100 mg by mouth 2 (two) times daily.    Marland Kitchen ULORIC 80 MG TABS Take 1 tablet by mouth daily.     . Vitamin D, Ergocalciferol, (DRISDOL) 50000 UNITS CAPS capsule Take 50,000 Units by mouth every Monday.    Penne Lash HFA 45 MCG/ACT inhaler Inhale 2 puffs into the lungs every 4 (four) hours as needed for wheezing or shortness of breath. 1 Inhaler 1  . zolpidem (AMBIEN) 10 MG tablet Take 10 mg by mouth at bedtime.      No current facility-administered medications for this visit.    Allergies:   Adhesive; Sulfa antibiotics; Codeine; and Penicillins   Social History: Social History   Social History  . Marital Status:  Widowed    Spouse Name: N/A  . Number of Children: 3  . Years of Education: N/A   Occupational History  . Disabled    Social History Main Topics  . Smoking status: Former Smoker -- 0.50 packs/day for 35 years    Types: Cigarettes    Quit date: 02/16/1995  . Smokeless tobacco: Never Used     Comment: 05/2014  QUIT OVER 20 YEARS AGO "  . Alcohol Use: No  . Drug Use: No  .  Sexual Activity: Not Currently   Other Topics Concern  . Not on file   Social History Narrative    Family History: Family History  Problem Relation Age of Onset  . Heart disease Mother   . Bladder Cancer Mother   . Kidney disease Mother   . Ovarian cancer Daughter   . Stomach cancer Maternal Uncle   . Colon cancer Maternal Aunt     dx in her 66's  . Esophageal cancer Neg Hx      Review of Systems: All other systems reviewed and are otherwise negative except as noted above.   Physical Exam: VS:  BP 120/76 mmHg  Pulse 60  Ht 5\' 4"  (1.626 m)  Wt 259 lb (117.482 kg)  BMI 44.44 kg/m2 , BMI Body mass index is 44.44 kg/(m^2).  GEN- The patient is obese appearing, alert and oriented x 3 today. In a wheelchair, wearing O2   HEENT: normocephalic, atraumatic; sclera clear, conjunctiva pink; hearing intact; oropharynx clear; neck supple  Lungs- Clear to ausculation bilaterally, normal work of breathing.  No wheezes, rales, rhonchi Heart- Regular rate and rhythm  GI- soft, non-tender, non-distended, bowel sounds present  Extremities- no clubbing, cyanosis  MS- no significant deformity or atrophy Skin- warm and dry, no rash or lesion; PPM pocket well healed Psych- euthymic mood, full affect Neuro- strength and sensation are intact  PPM Interrogation- reviewed in detail today,  See PACEART report  EKG:  EKG is ordered today. The ekg ordered today shows atrial pacing with intrinsic ventricular conduction, V rate 60  Recent Labs: 02/16/2015: B Natriuretic Peptide 364.2* 10/22/2015: ALT 21; BUN 50*;  Creatinine, Ser 3.22*; Hemoglobin 15.8*; Platelets 181; Potassium 3.0*; Sodium 142; TSH 2.868   Wt Readings from Last 3 Encounters:  01/06/16 259 lb (117.482 kg)  12/16/15 256 lb (116.121 kg)  11/11/15 258 lb (117.028 kg)     Other studies Reviewed: Additional studies/ records that were reviewed today include: Dr Jackalyn Lombard office notes  Assessment and Plan:  1.  Sick sinus syndrome Normal PPM function See Pace Art report No changes today  2.  Paroxysmal atrial fibrillation Burden by device interrogation today 0% Continue amiodarone for rhythm control - followed by pulmonary, recent TSH, LFT's normal Continue Eliquis for CHADS2VASC of 5 (CrCl 30 - will need to consider changing to Warfarin if worsens)  3.  HTN Stable No change required today  4.  Obesity Weight loss encouraged    Current medicines are reviewed at length with the patient today.   The patient does not have concerns regarding her medicines.  The following changes were made today:  none  Labs/ tests ordered today include: none    Disposition:   Follow up with Delilah Shan, Dr Aundra Dubin as scheduled, Dr Rayann Heman 1 year     Signed, Chanetta Marshall, NP 01/06/2016 1:01 PM  [facility] [address] [place] [address] [phone] (office) [phone] (fax)

## 2016-01-06 ENCOUNTER — Ambulatory Visit (INDEPENDENT_AMBULATORY_CARE_PROVIDER_SITE_OTHER): Payer: PPO | Admitting: Nurse Practitioner

## 2016-01-06 ENCOUNTER — Encounter: Payer: Self-pay | Admitting: Internal Medicine

## 2016-01-06 ENCOUNTER — Encounter: Payer: Self-pay | Admitting: Nurse Practitioner

## 2016-01-06 VITALS — BP 120/76 | HR 60 | Ht 64.0 in | Wt 259.0 lb

## 2016-01-06 DIAGNOSIS — I10 Essential (primary) hypertension: Secondary | ICD-10-CM

## 2016-01-06 DIAGNOSIS — I480 Paroxysmal atrial fibrillation: Secondary | ICD-10-CM

## 2016-01-06 DIAGNOSIS — I495 Sick sinus syndrome: Secondary | ICD-10-CM

## 2016-01-06 NOTE — Patient Instructions (Addendum)
Medication Instructions:   Your physician recommends that you continue on your current medications as directed. Please refer to the Current Medication list given to you today.    If you need a refill on your cardiac medications before your next appointment, please call your pharmacy.  Labwork: NONE ORDER TODAY    Testing/Procedures:  NONE ORDER TODAY    Follow-Up: Your physician wants you to follow-up in: [place] will receive a reminder letter in the mail two months in advance. If you don't receive a letter, please call our office to schedule the follow-up appointment.  Remote monitoring is used to monitor your Pacemaker of ICD from home. This monitoring reduces the number of office visits required to check your device to one time per year. It allows [place] to keep an eye on the functioning of your device to ensure it is working properly. You are scheduled for a device check from home on . 04/08/2016.Marland KitchenMarland KitchenYou may send your transmission at any time that day. If you have a wireless device, the transmission will be sent automatically. After your physician reviews your transmission, you will receive a postcard with your next transmission date.      Any Other Special Instructions Will Be Listed Below (If Applicable).

## 2016-01-07 DIAGNOSIS — J849 Interstitial pulmonary disease, unspecified: Secondary | ICD-10-CM | POA: Diagnosis not present

## 2016-01-07 DIAGNOSIS — E1142 Type 2 diabetes mellitus with diabetic polyneuropathy: Secondary | ICD-10-CM | POA: Diagnosis not present

## 2016-01-07 DIAGNOSIS — E1165 Type 2 diabetes mellitus with hyperglycemia: Secondary | ICD-10-CM | POA: Diagnosis not present

## 2016-01-07 DIAGNOSIS — E1122 Type 2 diabetes mellitus with diabetic chronic kidney disease: Secondary | ICD-10-CM | POA: Diagnosis not present

## 2016-01-08 DIAGNOSIS — I509 Heart failure, unspecified: Secondary | ICD-10-CM | POA: Diagnosis not present

## 2016-01-08 DIAGNOSIS — R0902 Hypoxemia: Secondary | ICD-10-CM | POA: Diagnosis not present

## 2016-01-08 DIAGNOSIS — G4733 Obstructive sleep apnea (adult) (pediatric): Secondary | ICD-10-CM | POA: Diagnosis not present

## 2016-01-14 ENCOUNTER — Encounter: Payer: Self-pay | Admitting: Adult Health

## 2016-01-14 ENCOUNTER — Ambulatory Visit: Payer: PPO | Admitting: Adult Health

## 2016-01-14 ENCOUNTER — Ambulatory Visit (INDEPENDENT_AMBULATORY_CARE_PROVIDER_SITE_OTHER): Payer: PPO | Admitting: Adult Health

## 2016-01-14 VITALS — BP 126/74 | HR 60 | Temp 97.8°F | Ht 64.0 in | Wt 256.0 lb

## 2016-01-14 DIAGNOSIS — G4733 Obstructive sleep apnea (adult) (pediatric): Secondary | ICD-10-CM

## 2016-01-14 DIAGNOSIS — J9809 Other diseases of bronchus, not elsewhere classified: Secondary | ICD-10-CM | POA: Diagnosis not present

## 2016-01-14 DIAGNOSIS — K219 Gastro-esophageal reflux disease without esophagitis: Secondary | ICD-10-CM

## 2016-01-14 DIAGNOSIS — J9611 Chronic respiratory failure with hypoxia: Secondary | ICD-10-CM | POA: Diagnosis not present

## 2016-01-14 DIAGNOSIS — J9610 Chronic respiratory failure, unspecified whether with hypoxia or hypercapnia: Secondary | ICD-10-CM | POA: Insufficient documentation

## 2016-01-14 DIAGNOSIS — J849 Interstitial pulmonary disease, unspecified: Secondary | ICD-10-CM | POA: Diagnosis not present

## 2016-01-14 NOTE — Assessment & Plan Note (Signed)
Cont on CPAP with O2  ONO shows minimal desats , cont at 3l/m w/ CPAP

## 2016-01-14 NOTE — Patient Instructions (Addendum)
Continue on Nexium 40mg  daily in before meal  Add Pepcid 20mg   At bedtime   GERD diet .  Refer to GI for worsening reflux.  Continue on O2 at 3l/m .  Continue on CPAP At bedtime  With oxygen at 3l/m .  follow up Dr. Halford Chessman  In 2 months and As needed   Please contact office for sooner follow up if symptoms do not improve or worsen or seek emergency care

## 2016-01-14 NOTE — Assessment & Plan Note (Signed)
?  GERD related   Plan  Continue on Nexium 40mg  daily in before meal  Add Pepcid 20mg   At bedtime   GERD diet .  Refer to GI for worsening reflux.  follow up Dr. Halford Chessman  In 2 months and As needed   Please contact office for sooner follow up if symptoms do not improve or worsen or seek emergency care

## 2016-01-14 NOTE — Progress Notes (Signed)
Subjective:    Patient ID: [name], female    DOB: 1946-04-26, 70 y.o.   MRN: [id number]  HPI [name] is a 70 y.o. female former smoker with aspiration PNA . OSA  Gr 2 DCHF ,    Tests: Echo 03/06/14 >> mild LVH, EF 60 to 65% Echo 02/2015 with Gr 2 D CHF , PAP 38 , EF 60%.  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  Serology 02/16/15 >> anti CCP negative, RF 23, anti Jo negative, Scl 70 negative, SSa/SSb negative, ANA negative, ESR 48 PFT 11/19/15 >> FEV1 1.85 (100%), FEV1% 83, TLC 5.44 (107%), RV 152%, DLCO 30%, borderline BD  01/14/2016   Follow up : ILD   Pt returns for 3 week follow up . She is followed for presumed ILD changes on CT .  CT chest was repeated on 2/24 showed stable 7 mm nodule in lingula . Stable interstitial patten in the lingula.  Remains on On oxygen with act and At bedtime with 3l/m  Previous labs showed +RA factor. Pt Was recently seen Dr. Amil Amen with Rheumatology.   Marland Kitchen  She was felt to have a polyarthralgia and positive rheumatoid factor along with her interstitial lung disease.-His notes indicated that most likely she does have rheumatoid arthritis. Her case is complicated because of her underlying chronic gout medications, stage IV chronic kidney disease. It makes treatment options for rheumatoid arthritis difficult. She was given prednisone, which helped her joint pain but did not relieve any of her respiratory symptoms. She has upcoming follow up with rheumatology in couple of months.  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  Cytology was neg for malignant cells. BAL cultures were neg to date.  Concern for GERD contributing to her lung findings. She complains of reflux into throat at times.    She has uncontrolled reflux despite daily nexium . We discussed GERD tx /diet.  She is followed by [facility], will make follow up ov to discuss uncontrolled GERD.  ONO on 01/08/16 on CPAP and O2 at 3l/m with very min desats around 87% for ~8 min  Out of 7.5hr .  She is very limited by activity as she gets winded with minimal walking. She is followed by Cardiology for  CHF and atrial Fib on amiodarone.  She denies any hemoptysis, chest pain, orthopnea, PND, or increased leg swelling.  .   Past Medical History  Diagnosis Date  . Atrial flutter ([place])     ablated by Dr Lovena Le in 2008  . Hypertension   . IDDM (insulin dependent diabetes mellitus) ([place])     type 2.   . Diastolic heart failure 04/9372    grade 2 diastolic dysfunction per 12/2874 echo  . Asthma   . Hyperlipidemia   . Fatty liver 2008    noted on ultrasound 2008  . Esophageal dysmotility 2008    noted on esophagram.  hx dysphagia.   . Arthritis   . Sleep apnea     wears CPAP  . Fatty tumor fatty tumor back  . Coronary atherosclerosis of native coronary artery   . Morbid obesity ([place])   . Myocardial infarction ([place]) 2009  . Heart murmur   . Peripheral vascular disease ([place])   . GERD (gastroesophageal reflux  disease) 2012    Barrets esophagus on bx 2012 and 2014.   . Anginal pain ([place])     occ; non-ischemic Lexiscan 09/2012  . CKD (chronic kidney disease) 04/2007    CKD stage 4(Dr. Erling Cruz)  . Complication of anesthesia      DIFFICULTY BREATHING   . Persistent atrial fibrillation (HCC)     chads2 vasc score of at least 5  . Sick sinus syndrome ([place])   . Gouty arthropathy    Current Outpatient Prescriptions on File Prior to Visit  Medication Sig Dispense Refill  . albuterol (PROVENTIL) (2.5 MG/3ML) 0.083% nebulizer solution Take 2.5 mg by nebulization every 6 (six) hours as needed for shortness of breath. Use four times a day as needed for shortness of breath or wheezing    . amiodarone (PACERONE) 200 MG tablet TAKE  ONE TABLET BY MOUTH ONCE DAILY 30 tablet 3  . amitriptyline (ELAVIL) 25 MG tablet Take 25 mg by mouth at bedtime.      Marland Kitchen apixaban (ELIQUIS) 5 MG TABS tablet Take 1 tablet (5 mg total) by mouth 2 (two) times daily. 60 tablet 3  . atorvastatin (LIPITOR) 40 MG tablet TAKE ONE TABLET BY MOUTH ONCE DAILY 90 tablet 0  . dicyclomine (BENTYL) 10 MG capsule Take 10 mg by mouth daily.     Marland Kitchen diltiazem (CARDIZEM CD) 180 MG 24 hr capsule Take 1 capsule (180 mg total) by mouth daily. 30 capsule 3  . esomeprazole (NEXIUM) 40 MG capsule Take 1 capsule (40 mg total) by mouth daily at 12 noon. 30 capsule 0  . hydrALAZINE (APRESOLINE) 25 MG tablet TAKE ONE TABLET BY MOUTH THREE TIMES DAILY 90 tablet 3  . insulin glargine (LANTUS) 100 UNIT/ML injection Inject 50 Units into the skin at bedtime.    Marland Kitchen levothyroxine (SYNTHROID, LEVOTHROID) 25 MCG tablet Take 1 tablet by mouth daily.    . Linaclotide (LINZESS) 145 MCG CAPS capsule Take 290 mcg by mouth daily. Does 145 mcg most days and then 252mg occ. To aid bowels    . metolazone (ZAROXOLYN) 2.5 MG tablet Take 2.5 mg by mouth as needed.     . metoprolol tartrate (LOPRESSOR) 25 MG tablet TAKE THREE TABLETS BY MOUTH TWICE DAILY 180 tablet 0  . nitroGLYCERIN (NITROSTAT) 0.4 MG SL tablet Place 0.4 mg under the tongue every 5 (five) minutes as needed for chest pain (x 3 doses). Reported on 10/22/2015    . OXYGEN Inhale 3 L/min into the lungs continuous. Patient uses nightly    . potassium chloride SA (K-DUR,KLOR-CON) 20 MEQ tablet Take by mouth as directed. Take 4 tabs in AM and 3 tabs in PM    . torsemide (DEMADEX) 100 MG tablet Take 100 mg by mouth 2 (two) times daily.    .Marland KitchenULORIC 80 MG TABS Take 1 tablet by mouth daily.     . Vitamin D, Ergocalciferol, (DRISDOL) 50000 UNITS CAPS capsule Take 50,000 Units by mouth every Monday.    .Penne LashHFA 45 MCG/ACT inhaler Inhale 2 puffs into the lungs every 4 (four) hours as needed for wheezing or shortness of breath. 1 Inhaler 1    . zolpidem (AMBIEN) 10 MG tablet Take 10 mg by mouth at bedtime.     . insulin lispro (HUMALOG) 100 UNIT/ML injection Inject 50 Units into the skin. Reported on 01/14/2016     No current facility-administered medications on file prior to visit.       Review of Systems  Constitutional:  No  weight loss, night sweats,  Fevers, chills,  +fatigue, or  lassitude.  HEENT:   No headaches,  Difficulty swallowing,  Tooth/dental problems, or  Sore throat,                No sneezing, itching, ear ache, nasal congestion, post nasal drip,   CV:  No chest pain,  Orthopnea, PND, swelling in lower extremities, anasarca, dizziness, palpitations, syncope.   GI  No heartburn, indigestion, abdominal pain, nausea, vomiting, diarrhea, change in bowel habits, loss of appetite, bloody stools.   Resp:    No chest wall deformity  Skin: no rash or lesions.  GU: no dysuria, change in color of urine, no urgency or frequency.  No flank pain, no hematuria   MS:  No joint pain or swelling.  No decreased range of motion.  No back pain.  Psych:  No change in mood or affect. No depression or anxiety.  No memory loss.         Objective:   Physical Exam    Filed Vitals:   01/14/16 1131  BP: 126/74  Pulse: 60  Temp: 97.8 F (36.6 C)  TempSrc: Oral  Height: 5' 4" (1.626 m)  Weight: 256 lb (116.121 kg)  SpO2: 97%    GEN: A/Ox3; pleasant , NAD, elderly , obese , Chronically ill appearing in wheelchair  HEENT:  [place]/AT,  EACs-clear, TMs-wnl, NOSE-clear, THROAT-clear, no lesions, no postnasal drip or exudate noted.   NECK:  Supple w/ fair ROM; no JVD; normal carotid impulses w/o bruits; no thyromegaly or nodules palpated; no lymphadenopathy.  RESP  Decreased BS in bases .no accessory muscle use, no dullness to percussion  CARD:  RRR, no m/r/g  , tr  peripheral edema, pulses intact, no cyanosis or clubbing.  GI:   Soft & nt; nml bowel sounds; no organomegaly or masses detected.  Musco: Warm bil, no  deformities. Arthritic changes in hands   Neuro: alert, no focal deficits noted.    Skin: Warm, no lesions or rashes    CT chest 10/29/15  Stable 7 mm pulmonary nodule in the lingula. Recommend follow-up CT without contrast in 12 months from current month to demonstrate benignity per Fleischner criteria 2. Stable reticular interstitial pattern in the lingula.    Tammy Parrett NP-C  [facility]   01/14/2016      Assessment & Plan:

## 2016-01-14 NOTE — Assessment & Plan Note (Signed)
Cont on O2 with CPAP

## 2016-01-14 NOTE — Assessment & Plan Note (Signed)
ILD change ? RA ILD vs GERD - less likely Amiodarone but  Cont to monitor amiodarone -as may need to look for alternative Cont follow up with Rheumatology .   Plan  Continue on Nexium 40mg  daily in before meal  Add Pepcid 20mg   At bedtime   GERD diet .  Refer to GI for worsening reflux.  Continue on O2 at 3l/m .  Continue on CPAP At bedtime  With oxygen at 3l/m .  follow up Dr. Halford Chessman  In 2 months and As needed   Please contact office for sooner follow up if symptoms do not improve or worsen or seek emergency care

## 2016-01-15 ENCOUNTER — Other Ambulatory Visit (HOSPITAL_COMMUNITY): Payer: Self-pay | Admitting: Internal Medicine

## 2016-01-17 DIAGNOSIS — E1165 Type 2 diabetes mellitus with hyperglycemia: Secondary | ICD-10-CM | POA: Diagnosis not present

## 2016-01-17 NOTE — Progress Notes (Signed)
Reviewed.  She might need further assessment by GI for reflux as contributor to ILD.    Chesley Mires, MD [facility]monary/Critical Care 01/17/2016, 3:13 PM Pager:  [phone]

## 2016-01-18 DIAGNOSIS — I509 Heart failure, unspecified: Secondary | ICD-10-CM | POA: Diagnosis not present

## 2016-01-18 DIAGNOSIS — G4733 Obstructive sleep apnea (adult) (pediatric): Secondary | ICD-10-CM | POA: Diagnosis not present

## 2016-01-19 DIAGNOSIS — R0602 Shortness of breath: Secondary | ICD-10-CM | POA: Diagnosis not present

## 2016-01-20 ENCOUNTER — Encounter: Payer: Self-pay | Admitting: Pulmonary Disease

## 2016-01-25 LAB — FUNGUS CULTURE WITH STAIN

## 2016-01-25 LAB — FUNGAL ORGANISM REFLEX

## 2016-01-25 LAB — FUNGUS CULTURE RESULT

## 2016-01-26 DIAGNOSIS — I509 Heart failure, unspecified: Secondary | ICD-10-CM | POA: Diagnosis not present

## 2016-01-28 ENCOUNTER — Other Ambulatory Visit (HOSPITAL_COMMUNITY): Payer: Self-pay | Admitting: Cardiology

## 2016-01-30 ENCOUNTER — Other Ambulatory Visit (HOSPITAL_COMMUNITY): Payer: Self-pay | Admitting: Internal Medicine

## 2016-02-07 LAB — ACID FAST CULTURE WITH REFLEXED SENSITIVITIES (MYCOBACTERIA): Acid Fast Culture: NEGATIVE

## 2016-02-08 ENCOUNTER — Telehealth: Payer: Self-pay | Admitting: Pulmonary Disease

## 2016-02-08 NOTE — Telephone Encounter (Signed)
Form has been completed and been given to Ashtyn to have VS sign. Will route message to Ashtyn so she may document once this has been faxed. Pt is aware that we will fax this in once VS signs it.

## 2016-02-09 NOTE — Telephone Encounter (Signed)
Pt aware that forms have been signed and faxed back to [place] and a copy placed to be scanned into her chart.  Pt aware that the originals are being mailed back to her home address. Nothing further needed.

## 2016-02-11 ENCOUNTER — Encounter: Payer: Self-pay | Admitting: Pulmonary Disease

## 2016-02-15 ENCOUNTER — Other Ambulatory Visit (HOSPITAL_COMMUNITY): Payer: Self-pay | Admitting: Internal Medicine

## 2016-02-16 ENCOUNTER — Other Ambulatory Visit (HOSPITAL_COMMUNITY): Payer: Self-pay | Admitting: Internal Medicine

## 2016-02-16 ENCOUNTER — Ambulatory Visit: Payer: PPO | Admitting: Pulmonary Disease

## 2016-02-18 ENCOUNTER — Ambulatory Visit: Payer: PPO | Admitting: Adult Health

## 2016-02-18 ENCOUNTER — Ambulatory Visit: Payer: PPO | Admitting: Acute Care

## 2016-02-18 DIAGNOSIS — H353120 Nonexudative age-related macular degeneration, left eye, stage unspecified: Secondary | ICD-10-CM | POA: Diagnosis not present

## 2016-02-18 DIAGNOSIS — H04123 Dry eye syndrome of bilateral lacrimal glands: Secondary | ICD-10-CM | POA: Diagnosis not present

## 2016-02-18 DIAGNOSIS — H26493 Other secondary cataract, bilateral: Secondary | ICD-10-CM | POA: Diagnosis not present

## 2016-02-18 DIAGNOSIS — H35031 Hypertensive retinopathy, right eye: Secondary | ICD-10-CM | POA: Diagnosis not present

## 2016-02-18 DIAGNOSIS — H353110 Nonexudative age-related macular degeneration, right eye, stage unspecified: Secondary | ICD-10-CM | POA: Diagnosis not present

## 2016-02-18 DIAGNOSIS — H35032 Hypertensive retinopathy, left eye: Secondary | ICD-10-CM | POA: Diagnosis not present

## 2016-02-18 DIAGNOSIS — E119 Type 2 diabetes mellitus without complications: Secondary | ICD-10-CM | POA: Diagnosis not present

## 2016-02-18 DIAGNOSIS — I509 Heart failure, unspecified: Secondary | ICD-10-CM | POA: Diagnosis not present

## 2016-02-18 DIAGNOSIS — G4733 Obstructive sleep apnea (adult) (pediatric): Secondary | ICD-10-CM | POA: Diagnosis not present

## 2016-02-18 DIAGNOSIS — Z961 Presence of intraocular lens: Secondary | ICD-10-CM | POA: Diagnosis not present

## 2016-02-19 DIAGNOSIS — R0602 Shortness of breath: Secondary | ICD-10-CM | POA: Diagnosis not present

## 2016-02-25 ENCOUNTER — Ambulatory Visit (HOSPITAL_COMMUNITY)
Admission: RE | Admit: 2016-02-25 | Discharge: 2016-02-25 | Disposition: A | Discharge status: Home / Self Care | Payer: PPO | Source: Ambulatory Visit | Attending: Cardiology | Admitting: Cardiology

## 2016-02-25 VITALS — BP 130/90 | HR 62 | Wt 256.5 lb

## 2016-02-25 DIAGNOSIS — E785 Hyperlipidemia, unspecified: Secondary | ICD-10-CM | POA: Diagnosis not present

## 2016-02-25 DIAGNOSIS — I739 Peripheral vascular disease, unspecified: Secondary | ICD-10-CM | POA: Diagnosis not present

## 2016-02-25 DIAGNOSIS — M25572 Pain in left ankle and joints of left foot: Secondary | ICD-10-CM | POA: Diagnosis not present

## 2016-02-25 DIAGNOSIS — R918 Other nonspecific abnormal finding of lung field: Secondary | ICD-10-CM | POA: Insufficient documentation

## 2016-02-25 DIAGNOSIS — Z87891 Personal history of nicotine dependence: Secondary | ICD-10-CM | POA: Diagnosis not present

## 2016-02-25 DIAGNOSIS — K219 Gastro-esophageal reflux disease without esophagitis: Secondary | ICD-10-CM | POA: Diagnosis not present

## 2016-02-25 DIAGNOSIS — R5383 Other fatigue: Secondary | ICD-10-CM | POA: Diagnosis not present

## 2016-02-25 DIAGNOSIS — I2510 Atherosclerotic heart disease of native coronary artery without angina pectoris: Secondary | ICD-10-CM | POA: Diagnosis not present

## 2016-02-25 DIAGNOSIS — M25561 Pain in right knee: Secondary | ICD-10-CM | POA: Diagnosis not present

## 2016-02-25 DIAGNOSIS — Z794 Long term (current) use of insulin: Secondary | ICD-10-CM | POA: Diagnosis not present

## 2016-02-25 DIAGNOSIS — I252 Old myocardial infarction: Secondary | ICD-10-CM | POA: Insufficient documentation

## 2016-02-25 DIAGNOSIS — M1990 Unspecified osteoarthritis, unspecified site: Secondary | ICD-10-CM | POA: Insufficient documentation

## 2016-02-25 DIAGNOSIS — Z8041 Family history of malignant neoplasm of ovary: Secondary | ICD-10-CM | POA: Insufficient documentation

## 2016-02-25 DIAGNOSIS — J45909 Unspecified asthma, uncomplicated: Secondary | ICD-10-CM | POA: Insufficient documentation

## 2016-02-25 DIAGNOSIS — R0602 Shortness of breath: Secondary | ICD-10-CM | POA: Diagnosis not present

## 2016-02-25 DIAGNOSIS — E1122 Type 2 diabetes mellitus with diabetic chronic kidney disease: Secondary | ICD-10-CM | POA: Insufficient documentation

## 2016-02-25 DIAGNOSIS — K760 Fatty (change of) liver, not elsewhere classified: Secondary | ICD-10-CM | POA: Insufficient documentation

## 2016-02-25 DIAGNOSIS — I4892 Unspecified atrial flutter: Secondary | ICD-10-CM | POA: Diagnosis not present

## 2016-02-25 DIAGNOSIS — I495 Sick sinus syndrome: Secondary | ICD-10-CM | POA: Diagnosis not present

## 2016-02-25 DIAGNOSIS — Z6841 Body Mass Index (BMI) 40.0 and over, adult: Secondary | ICD-10-CM | POA: Insufficient documentation

## 2016-02-25 DIAGNOSIS — Z7901 Long term (current) use of anticoagulants: Secondary | ICD-10-CM | POA: Insufficient documentation

## 2016-02-25 DIAGNOSIS — M25562 Pain in left knee: Secondary | ICD-10-CM | POA: Diagnosis not present

## 2016-02-25 DIAGNOSIS — N184 Chronic kidney disease, stage 4 (severe): Secondary | ICD-10-CM | POA: Diagnosis not present

## 2016-02-25 DIAGNOSIS — G4733 Obstructive sleep apnea (adult) (pediatric): Secondary | ICD-10-CM | POA: Diagnosis not present

## 2016-02-25 DIAGNOSIS — Z800 Family history of malignant neoplasm of digestive organs: Secondary | ICD-10-CM | POA: Diagnosis not present

## 2016-02-25 DIAGNOSIS — I5032 Chronic diastolic (congestive) heart failure: Secondary | ICD-10-CM | POA: Diagnosis present

## 2016-02-25 DIAGNOSIS — I481 Persistent atrial fibrillation: Secondary | ICD-10-CM | POA: Diagnosis not present

## 2016-02-25 DIAGNOSIS — M25571 Pain in right ankle and joints of right foot: Secondary | ICD-10-CM | POA: Diagnosis not present

## 2016-02-25 DIAGNOSIS — Z9981 Dependence on supplemental oxygen: Secondary | ICD-10-CM | POA: Insufficient documentation

## 2016-02-25 DIAGNOSIS — M1000 Idiopathic gout, unspecified site: Secondary | ICD-10-CM | POA: Diagnosis not present

## 2016-02-25 DIAGNOSIS — Z8249 Family history of ischemic heart disease and other diseases of the circulatory system: Secondary | ICD-10-CM | POA: Insufficient documentation

## 2016-02-25 DIAGNOSIS — J849 Interstitial pulmonary disease, unspecified: Secondary | ICD-10-CM | POA: Insufficient documentation

## 2016-02-25 DIAGNOSIS — I130 Hypertensive heart and chronic kidney disease with heart failure and stage 1 through stage 4 chronic kidney disease, or unspecified chronic kidney disease: Secondary | ICD-10-CM | POA: Diagnosis not present

## 2016-02-25 DIAGNOSIS — Z841 Family history of disorders of kidney and ureter: Secondary | ICD-10-CM | POA: Diagnosis not present

## 2016-02-25 DIAGNOSIS — Z9889 Other specified postprocedural states: Secondary | ICD-10-CM | POA: Insufficient documentation

## 2016-02-25 LAB — COMPREHENSIVE METABOLIC PANEL
ALT: 25 U/L (ref 14–54)
AST: 29 U/L (ref 15–41)
Albumin: 3.8 g/dL (ref 3.5–5.0)
Alkaline Phosphatase: 79 U/L (ref 38–126)
Anion gap: 13 (ref 5–15)
BUN: 58 mg/dL — ABNORMAL HIGH (ref 6–20)
CO2: 22 mmol/L (ref 22–32)
Calcium: 9.8 mg/dL (ref 8.9–10.3)
Chloride: 102 mmol/L (ref 101–111)
Creatinine, Ser: 3.35 mg/dL — ABNORMAL HIGH (ref 0.44–1.00)
GFR calc Af Amer: 15 mL/min — ABNORMAL LOW (ref >60)
GFR calc non Af Amer: 13 mL/min — ABNORMAL LOW (ref >60)
Glucose, Bld: 290 mg/dL — ABNORMAL HIGH (ref 65–99)
Potassium: 3.2 mmol/L — ABNORMAL LOW (ref 3.5–5.1)
Sodium: 137 mmol/L (ref 135–145)
Total Bilirubin: 0.5 mg/dL (ref 0.3–1.2)
Total Protein: 7.4 g/dL (ref 6.5–8.1)

## 2016-02-25 LAB — CBC
HCT: 43.3 % (ref 36.0–46.0)
Hemoglobin: 14.4 g/dL (ref 12.0–15.0)
MCH: 31.7 pg (ref 26.0–34.0)
MCHC: 33.3 g/dL (ref 30.0–36.0)
MCV: 95.4 fL (ref 78.0–100.0)
Platelets: 175 10*3/uL (ref 150–400)
RBC: 4.54 MIL/uL (ref 3.87–5.11)
RDW: 14.7 % (ref 11.5–15.5)
WBC: 5.8 10*3/uL (ref 4.0–10.5)

## 2016-02-25 LAB — TSH: TSH: 3.044 u[IU]/mL (ref 0.350–4.500)

## 2016-02-25 MED ORDER — METOLAZONE 2.5 MG PO TABS: 2.5000 mg | ORAL_TABLET | ORAL | Status: DC

## 2016-02-25 MED ORDER — APIXABAN 5 MG PO TABS: 5.0000 mg | ORAL_TABLET | Freq: Two times a day (BID) | ORAL | Status: DC

## 2016-02-25 NOTE — Progress Notes (Signed)
Advanced Heart Failure Medication Review by a Pharmacist  Does the patient  feel that his/her medications are working for him/her?  yes  Has the patient been experiencing any side effects to the medications prescribed?  no  Does the patient measure his/her own blood pressure or blood glucose at home?  no   Does the patient have any problems obtaining medications due to transportation or finances?   no  Understanding of regimen: poor Understanding of indications: poor Potential of compliance: good Patient understands to avoid NSAIDs. Patient understands to avoid decongestants  Pharmacist comments: [name] is a pleasant 52 yof presenting to clinic for a follow-up appointment. She denies any medication-related side-effects at this time. She does endorse some extra fluid and swelling in her legs today, and she does experience dizziness when she stands up too quickly. She has a rather poor understanding of her medication regimen but has someone who helps with her medications at home. She does not have any other medication-related questions or concerns at this time.   Jowel Waltner C. Lennox Grumbles, PharmD Pharmacy Resident  Pager: [phone] 02/25/2016 10:35 AM   Time with patient: 10 min  Preparation and documentation time: 2 min  Total time: 12 min

## 2016-02-25 NOTE — Patient Instructions (Signed)
INCREASE Metolazone 2.5 mg to TWICE WEEKLY. Tuesday and Saturdays.  CONTINUE Potassium 80 mg (4 tabs) in am and 60 mg (3 tabs) in pm.  Routine lab work today. Will notify you of abnormal results, otherwise no news is good news!  Return in 2 weeks for labs.  Follow up 6 weeks with Dr. Aundra Dubin.  Do the following things EVERYDAY: 1) Weigh yourself in the morning before breakfast. Write it down and keep it in a log. 2) Take your medicines as prescribed 3) Eat low salt foods-Limit salt (sodium) to 2000 mg per day.  4) Stay as active as you can everyday 5) Limit all fluids for the day to less than 2 liters

## 2016-02-26 DIAGNOSIS — I509 Heart failure, unspecified: Secondary | ICD-10-CM | POA: Diagnosis not present

## 2016-02-27 NOTE — Progress Notes (Signed)
Patient ID: [name], female   DOB: 1946-01-11, 70 y.o.   MRN: [id number] PCP: Dr. Noah Delaine  Nephrologist: Dr. Florene Glen Cardiology: Dr Aundra Dubin   HPI: [name] is a 70 year old woman with hypertension, hyperlipidemia, bronchial asthma, diabetes mellitus, CKD stage IV (baseline cr ~2.5), atrial fibrillation and diastolic HF.  She was admitted in 4/15 for R TKR. That hospitalization complicated by a/c diastolic HF with respiratory distress requiring non-rebreather support. Weight on discharge was 226 pounds. ABG at that time 7.4/34/91/97%. Cr peaked at 2.9  Admitted 6/30-7/10/15 for SOB and CP following scheduled cardioversion. Found to have severe aspiration PNA and was intubated. She maintained SR for short period of time and then went back into Afib/Aflutter.  HR controlled on amio and diltiazem. Discharged to [facility] at a weight of 232 lbs.   Admitted 06/16/14 with symptomatic tachy/brady syndrome. 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 Discharge weight was 223 pounds.   Admitted 02/16/15 for symptomatic anemia s/p blood work from her pulmonologist.  Was diuresed with lasix while in hospital.   Last echo in 6/16 with EF 60-65%, PASP 37 mmHg.   PFTs (6/16) with FVC 80%, FEV1 83%, ratio 103%, TLC 75%, DLCO 78% => restriction c/w interstitial process.  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   Holter (7/16) with rare PACs, PVCs; no atrial fibrillation.   After her CT showing interstitial fibrosis, she saw pulmonary. There was concern for connective tissue disease with lung involvement.  She has seen rheumatology and was told that she likely has rheumatoid arthritis.  The pulmonologist did not think that her ILD was due to amiodarone, so she is still on amiodarone.    She continues to have joint pain with knees and ankles hurting.  She is short of breath with most exertion, including walking  around the house. She thinks this is worse recently.  Very fatigued.  She says that she feels the best the day after she takes metolazone (using once weekly).  Continues to wear home oxygen and uses CPAP at night.  No chest pain. No palpitations. No atrial fibrillation on recent PPM interrogation.  No melena/BRBPR.   Labs: 03/13/14: K+ 3.4, creatinine 2.10, BUN 16 04/02/14: K 5.6, creatinine 2.66, BUN 29 04/13/14: K 4.0, creatinine 2.66, K 5.6 06/20/14 K 3.7 creatinine 2.35  07/21/14 K 3.6 creatinine 2.55 2/16 K 3.8, creatinine 3.03, LFTs normal, TSH normal, hgb 12.8  5/16 K 3.5, creatinine 3.37, LFTs normal, TSH mildly elevated, T3 just below normal, T4 normal. BNP 147 6/16 K 3.9, creatinine 2.98, BNP 364.2, SCL-70 negative, RF elevated but CCP negative, ANA negative, ESR 48.  7/16 K 3.4, creatinine 3.31 LFTs normal, TSH and T4 normal, T3 just below normal, HCT 42.4 11/16: K 3.5, creatinine 3.4, HCT 45.1 2/17: K 3, creatinine 3.22, LFTs normal, TSH normal, HCT 46.9  ROS: All systems negative except as listed in HPI, PMH and Problem List.  SH:  Social History   Social History  . Marital Status: Widowed    Spouse Name: N/A  . Number of Children: 3  . Years of Education: N/A   Occupational History  . Disabled    Social History Main Topics  . Smoking status: Former Smoker -- 0.50 packs/day for 35 years    Types: Cigarettes    Quit date: 02/16/1995  . Smokeless tobacco: Never Used     Comment: 05/2014  QUIT OVER 20 YEARS AGO "  .  Alcohol Use: No  . Drug Use: No  . Sexual Activity: Not Currently   Other Topics Concern  . Not on file   Social History Narrative    FH:  Family History  Problem Relation Age of Onset  . Heart disease Mother   . Bladder Cancer Mother   . Kidney disease Mother   . Ovarian cancer Daughter   . Stomach cancer Maternal Uncle   . Colon cancer Maternal Aunt     dx in her 65's  . Esophageal cancer Neg Hx     Past Medical History  Diagnosis Date   . Atrial flutter ([place])     ablated by Dr Lovena Le in 2008  . Hypertension   . IDDM (insulin dependent diabetes mellitus) ([place])     type 2.   . Diastolic heart failure 01/8831    grade 2 diastolic dysfunction per 01/4981 echo  . Asthma   . Hyperlipidemia   . Fatty liver 2008    noted on ultrasound 2008  . Esophageal dysmotility 2008    noted on esophagram.  hx dysphagia.   . Arthritis   . Sleep apnea     wears CPAP  . Fatty tumor fatty tumor back  . Coronary atherosclerosis of native coronary artery   . Morbid obesity ([place])   . Myocardial infarction ([place]) 2009  . Heart murmur   . Peripheral vascular disease ([place])   . GERD (gastroesophageal reflux disease) 2012    Barrets esophagus on bx 2012 and 2014.   . Anginal pain ([name])     occ; non-ischemic Lexiscan 09/2012  . CKD (chronic kidney disease) 04/2007    CKD stage 4(Dr. Erling Cruz)  . Complication of anesthesia      DIFFICULTY BREATHING   . Persistent atrial fibrillation (HCC)     chads2 vasc score of at least 5  . Sick sinus syndrome ([place])   . Gouty arthropathy    Current Outpatient Prescriptions  Medication Sig Dispense Refill  . albuterol (PROVENTIL) (2.5 MG/3ML) 0.083% nebulizer solution Take 2.5 mg by nebulization every 6 (six) hours as needed for shortness of breath. Use four times a day as needed for shortness of breath or wheezing    . amiodarone (PACERONE) 200 MG tablet TAKE ONE TABLET BY MOUTH ONCE DAILY 30 tablet 3  . amitriptyline (ELAVIL) 25 MG tablet Take 25 mg by mouth at bedtime.      Marland Kitchen apixaban (ELIQUIS) 5 MG TABS tablet Take 1 tablet (5 mg total) by mouth 2 (two) times daily. 60 tablet 6  . dicyclomine (BENTYL) 10 MG capsule Take 10 mg by mouth daily.     Marland Kitchen diltiazem (CARDIZEM CD) 180 MG 24 hr capsule Take 1 capsule (180 mg total) by mouth daily. 30 capsule 3  . esomeprazole (NEXIUM) 40 MG capsule Take 1 capsule (40 mg total) by mouth daily at 12 noon. 30 capsule 0  . hydrALAZINE (APRESOLINE) 25 MG tablet  TAKE ONE TABLET BY MOUTH THREE TIMES DAILY 90 tablet 0  . insulin glargine (LANTUS) 100 UNIT/ML injection Inject 60 Units into the skin every morning.     . insulin lispro (HUMALOG) 100 UNIT/ML injection Inject 40 Units into the skin 3 (three) times daily with meals. Reported on 01/14/2016    . levothyroxine (SYNTHROID, LEVOTHROID) 25 MCG tablet Take 1 tablet by mouth daily.    . Linaclotide (LINZESS) 145 MCG CAPS capsule Take 290 mcg by mouth daily. Does 145 mcg most days and then 284mg occ.  To aid bowels    . lovastatin (MEVACOR) 10 MG tablet Take 10 mg by mouth at bedtime.    . metolazone (ZAROXOLYN) 2.5 MG tablet Take 1 tablet (2.5 mg total) by mouth 2 (two) times a week. Tuesday and Saturday 10 tablet 6  . metoprolol tartrate (LOPRESSOR) 25 MG tablet TAKE THREE TABLETS BY MOUTH TWICE DAILY 180 tablet 0  . nitroGLYCERIN (NITROSTAT) 0.4 MG SL tablet Place 0.4 mg under the tongue every 5 (five) minutes as needed for chest pain (x 3 doses). Reported on 02/25/2016    . OXYGEN Inhale 3 L/min into the lungs continuous. Patient uses nightly    . potassium chloride SA (K-DUR,KLOR-CON) 20 MEQ tablet Take by mouth as directed. Take 4 tabs in AM and 3 tabs in PM    . torsemide (DEMADEX) 100 MG tablet Take 100 mg by mouth 2 (two) times daily.    Marland Kitchen ULORIC 80 MG TABS Take 1 tablet by mouth daily.     . Vitamin D, Ergocalciferol, (DRISDOL) 50000 UNITS CAPS capsule Take 50,000 Units by mouth every Monday.    Penne Lash HFA 45 MCG/ACT inhaler Inhale 2 puffs into the lungs every 4 (four) hours as needed for wheezing or shortness of breath. 1 Inhaler 1  . zolpidem (AMBIEN) 10 MG tablet Take 10 mg by mouth at bedtime.      No current facility-administered medications for this encounter.    Filed Vitals:   02/25/16 1019  BP: 130/90  Pulse: 62  Weight: 256 lb 8 oz (116.348 kg)  SpO2: 95%    PHYSICAL EXAM: General: Obese, NAD, in wheelchair. Daughter present HEENT: normal  Neck: supple. JVP 8-9 cm;  Carotids 2+ bilat; no bruits. No lymphadenopathy or thryomegaly  Cor: PMI nonpalpable. Distant. Regular rate. No murmur appreciated Lungs: Slight crackles at bases bilaterally.  Abdomen: Obese, soft, nontender. mild-distended. No hepatosplenomegaly. No bruits or masses. +BS Extremities: no cyanosis, clubbing, rash, warm. 1+ ankle edema.   Neuro: alert & orientedx3, cranial nerves grossly intact. moves all 4 extremities w/o difficulty. Affect pleasant   ASSESSMENT & PLAN:  1) Chronic diastolic HF: EF 08-65% (7/84 echo). NYHA class IIIb symptoms, seem to be worsening over time.  Weight is up about 2 lbs compared to last appointment.  Exam difficult for volume but may be mildly overloaded.  Dyspnea likely has a prominent pulmonary component with restrictive PFTs from body habitus and interstitial lung disease (NSIP pattern by high resolution CT).  She is on torsemide 100 mg bid with metolazone once a week.  - Check BMET/BNP today.   - Continue torsemide 100 mg bid.  - Increase metolazone to twice a week on Saturday and Tuesday.  BMET in 2 wks.    2) Afib/Aflutter:  She remains in NSR on amiodarone.  I had her see pulmonary because of interstitial lung disease, this is not thought to be due to amiodarone.   - Check LFTs and TSH given amiodarone use.  She will need regular eye exams on amiodarone.  - No bleeding problems on Eliquis (h/o cecal AVM s/p ablation).  CBC today.   I discussed Eliquis with pharmacy today CKD, will plan to continue for now as she has not had problems with it.  If renal function worsens any, will have to switch to coumadin.  3) CKD stage IV: BMET today. Sees nephrology.  4) Tachy/Brady syndrome: St Jude PPM.   5) OSA: Use CPAP. 6) Interstitial lung disease: Has ILD, may be RA-related.  Has seen Dr Amil Amen and follows with Dr Halford Chessman for pulmonology.  She is on home oxygen.    Followup in 6 wks.     [name] 02/27/2016

## 2016-02-28 ENCOUNTER — Telehealth (HOSPITAL_COMMUNITY): Payer: Self-pay

## 2016-02-28 MED ORDER — POTASSIUM CHLORIDE CRYS ER 20 MEQ PO TBCR: 80.0000 meq | EXTENDED_RELEASE_TABLET | Freq: Two times a day (BID) | ORAL | Status: DC

## 2016-02-28 NOTE — Telephone Encounter (Signed)
Notes Recorded by Effie Berkshire, RN on 02/28/2016 at 9:07 AM BMET to be rechecked on 03/10/16 with CHF clinic Notes Recorded by Effie Berkshire, RN on 02/28/2016 at 9:07 AM Patient aware and agreeable. States she has been doing 4 in am and 3 in pm so will up daily dose to 80 mg BID. Rx updated to [facility] on [address] as preferred by patient.  Renee Pain  Notes Recorded by Larey Dresser, MD on 02/27/2016 at 10:38 PM Increase KCl by 20 mEq daily with low K. BMET 10 days.

## 2016-03-03 ENCOUNTER — Ambulatory Visit (INDEPENDENT_AMBULATORY_CARE_PROVIDER_SITE_OTHER): Payer: PPO | Admitting: Gastroenterology

## 2016-03-03 ENCOUNTER — Encounter: Payer: Self-pay | Admitting: Gastroenterology

## 2016-03-03 VITALS — BP 132/72 | HR 74 | Ht 64.0 in | Wt 258.8 lb

## 2016-03-03 DIAGNOSIS — K219 Gastro-esophageal reflux disease without esophagitis: Secondary | ICD-10-CM | POA: Diagnosis not present

## 2016-03-03 DIAGNOSIS — F458 Other somatoform disorders: Secondary | ICD-10-CM

## 2016-03-03 DIAGNOSIS — R198 Other specified symptoms and signs involving the digestive system and abdomen: Secondary | ICD-10-CM

## 2016-03-03 DIAGNOSIS — R1310 Dysphagia, unspecified: Secondary | ICD-10-CM | POA: Diagnosis not present

## 2016-03-03 DIAGNOSIS — R0989 Other specified symptoms and signs involving the circulatory and respiratory systems: Secondary | ICD-10-CM

## 2016-03-03 MED ORDER — ESOMEPRAZOLE MAGNESIUM 40 MG PO CPDR: 40.0000 mg | DELAYED_RELEASE_CAPSULE | Freq: Two times a day (BID) | ORAL | Status: DC

## 2016-03-03 MED ORDER — OMEPRAZOLE 40 MG PO CPDR: 40.0000 mg | DELAYED_RELEASE_CAPSULE | Freq: Two times a day (BID) | ORAL | Status: DC

## 2016-03-03 NOTE — Patient Instructions (Signed)
You have been scheduled for a Barium Esophogram at [facility]iology (1st floor of the hospital) on 03/14/2016 at 11am. Please arrive 15 minutes prior to your appointment for registration. Make certain not to have anything to eat or drink 6 hours prior to your test. If you need to reschedule for any reason, please contact radiology at [phone] to do so. __________________________________________________________________ A barium swallow is an examination that concentrates on views of the esophagus. This tends to be a double contrast exam (barium and two liquids which, when combined, create a gas to distend the wall of the oesophagus) or single contrast (non-ionic iodine based). The study is usually tailored to your symptoms so a good history is essential. Attention is paid during the study to the form, structure and configuration of the esophagus, looking for functional disorders (such as aspiration, dysphagia, achalasia, motility and reflux) EXAMINATION You may be asked to change into a gown, depending on the type of swallow being performed. A radiologist and radiographer will perform the procedure. The radiologist will advise you of the type of contrast selected for your procedure and direct you during the exam. You will be asked to stand, sit or lie in several different positions and to hold a small amount of fluid in your mouth before being asked to swallow while the imaging is performed .In some instances you may be asked to swallow barium coated marshmallows to assess the motility of a solid food bolus. The exam can be recorded as a digital or video fluoroscopy procedure. POST PROCEDURE It will take 1-2 days for the barium to pass through your system. To facilitate this, it is important, unless otherwise directed, to increase your fluids for the next 24-48hrs and to resume your normal diet.  This test typically takes about 30 minutes to  perform. __________________________________________________________________________________  We will send in omeprazole to your pharmacy

## 2016-03-03 NOTE — Progress Notes (Signed)
HPI :  70 y/o female here for a follow up visit for GERD. She is on Eliquis for history of heart failure and a-flutter. She has had a chest CT and bronchoscopy per Dr. Halford Chessman for respiratory symptoms since our last visit, concerning for interstitial lung disease. There is a concern that her lung disease could be made worse by ongoing reflux and here to follow up to discuss this further. She is on oxygen, uses wheelchair for ambulation for the most part when out of the house, with poor functional status. Daughter is present today  She takes nexium 40mg  once daily for history of [name]'s esophagus and GERD. She reports she does not have much heartburn, but does have regurgitation which bothers her. She also has a history of globus at times which bothers her at times. She reports coughing at times when she attempts to swallow. She reports when she swallows at times she has coughing and feels like it "goes down the wrong pipe". She otherwise endorses occasional dysphagia to solids when she is able to get it past her throat, and needs to drink water to help push it down sometimes. No nausea or vomiting. Her last EGD was in 2014 showing irregular z-line with nondysplastic BE, otherwise negative. She has had a prior barium study in 04/2014 which showed significant dysmotility of the esophagus. She has never had a prior manometry study.   Endoscopic history: EGD 08/2013 - irregular zline, biopsies c/w BE EGD 06/2011 - irregular zline, biopsies c/w BE Colonoscopy 02/2015 - 14mm cecal AVM, ablated   Past Medical History  Diagnosis Date  . Atrial flutter ([place])     ablated by Dr Lovena Le in 2008  . Hypertension   . IDDM (insulin dependent diabetes mellitus) ([place])     type 2.   . Diastolic heart failure 0000000    grade 2 diastolic dysfunction per [id number] echo  . Asthma   . Hyperlipidemia   . Fatty liver 2008    noted on ultrasound 2008  . Esophageal dysmotility 2008    noted on esophagram.  hx dysphagia.     . Arthritis   . Sleep apnea     wears CPAP  . Fatty tumor fatty tumor back  . Coronary atherosclerosis of native coronary artery   . Morbid obesity ([place])   . Myocardial infarction ([place]) 2009  . Heart murmur   . Peripheral vascular disease ([place])   . GERD (gastroesophageal reflux disease) 2012    Barrets esophagus on bx 2012 and 2014.   . Anginal pain ([place])     occ; non-ischemic Lexiscan 09/2012  . CKD (chronic kidney disease) 04/2007    CKD stage 4(Dr. Erling Cruz)  . Complication of anesthesia      DIFFICULTY BREATHING   . Persistent atrial fibrillation (HCC)     chads2 vasc score of at least 5  . Sick sinus syndrome ([place])   . Gouty arthropathy      Past Surgical History  Procedure Laterality Date  . Coronary angioplasty with stent placement    . Breast lumpectomy      right  . Tubal ligation    . Tonsillectomy    . Total knee arthroplasty Right 12/15/2013    Procedure: RIGHT TOTAL KNEE ARTHROPLASTY;  Surgeon: Alta Corning, MD;  Location: [facility];  Service: Orthopedics;  Laterality: Right;  . Cardioversion N/A 03/03/2014    Procedure: CARDIOVERSION;  Surgeon: Laverda Page, MD;  Location: [facility];  Service: Cardiovascular;  Laterality: N/A;  . Atrial flutter ablation  2008    CTI ablation by Dr Lovena Le  . Pacemaker insertion  06/18/14    STJ Assurity dual chamber pacemaker implanted by Dr Rayann Heman  . Right heart catheterization N/A 06/12/2014    Procedure: RIGHT HEART CATH;  Surgeon: Jolaine Artist, MD;  Location: [facility] CATH LAB;  Service: Cardiovascular;  Laterality: N/A;  . Permanent pacemaker insertion N/A 06/18/2014    SJM Assurity DR pacemaker implanted by Dr Rayann Heman for sick sinus syndrome  . Colonoscopy with propofol N/A 02/18/2015    Procedure: COLONOSCOPY WITH PROPOFOL;  Surgeon: Milus Banister, MD;  Location: [facility];  Service: Endoscopy;  Laterality: N/A;  . Video bronchoscopy Bilateral 12/23/2015    Procedure: VIDEO BRONCHOSCOPY WITH FLUORO;  Surgeon:  Chesley Mires, MD;  Location: [facility] END[facility];  Service: Cardiopulmonary;  Laterality: Bilateral;   Family History  Problem Relation Age of Onset  . Heart disease Mother   . Bladder Cancer Mother   . Kidney disease Mother   . Ovarian cancer Daughter   . Stomach cancer Maternal Uncle   . Colon cancer Maternal Aunt     dx in her 68's  . Esophageal cancer Neg Hx    Social History  Substance Use Topics  . Smoking status: Former Smoker -- 0.50 packs/day for 35 years    Types: Cigarettes    Quit date: 02/16/1995  . Smokeless tobacco: Never Used     Comment: 05/2014  QUIT OVER 20 YEARS AGO "  . Alcohol Use: No   Current Outpatient Prescriptions  Medication Sig Dispense Refill  . albuterol (PROVENTIL) (2.5 MG/3ML) 0.083% nebulizer solution Take 2.5 mg by nebulization every 6 (six) hours as needed for shortness of breath. Use four times a day as needed for shortness of breath or wheezing    . amiodarone (PACERONE) 200 MG tablet TAKE ONE TABLET BY MOUTH ONCE DAILY 30 tablet 3  . amitriptyline (ELAVIL) 25 MG tablet Take 25 mg by mouth at bedtime.      Marland Kitchen apixaban (ELIQUIS) 5 MG TABS tablet Take 1 tablet (5 mg total) by mouth 2 (two) times daily. 60 tablet 6  . dicyclomine (BENTYL) 10 MG capsule Take 10 mg by mouth daily.     Marland Kitchen diltiazem (CARDIZEM CD) 180 MG 24 hr capsule Take 1 capsule (180 mg total) by mouth daily. 30 capsule 3  . esomeprazole (NEXIUM) 40 MG capsule Take 40 mg by mouth daily.    . hydrALAZINE (APRESOLINE) 25 MG tablet TAKE ONE TABLET BY MOUTH THREE TIMES DAILY 90 tablet 0  . insulin glargine (LANTUS) 100 UNIT/ML injection Inject 60 Units into the skin every morning.     . insulin lispro (HUMALOG) 100 UNIT/ML injection Inject 40 Units into the skin 3 (three) times daily with meals. Reported on 01/14/2016    . levothyroxine (SYNTHROID, LEVOTHROID) 25 MCG tablet Take 1 tablet by mouth daily.    . Linaclotide (LINZESS) 145 MCG CAPS capsule Take 290 mcg by mouth daily. Does 145 mcg most  days and then 249mcg occ. To aid bowels    . lovastatin (MEVACOR) 10 MG tablet Take 10 mg by mouth at bedtime.    . metolazone (ZAROXOLYN) 2.5 MG tablet Take 1 tablet (2.5 mg total) by mouth 2 (two) times a week. Tuesday and Saturday 10 tablet 6  . metoprolol tartrate (LOPRESSOR) 25 MG tablet TAKE THREE TABLETS BY MOUTH TWICE DAILY 180 tablet 0  . nitroGLYCERIN (NITROSTAT) 0.4 MG SL tablet  Place 0.4 mg under the tongue every 5 (five) minutes as needed for chest pain (x 3 doses). Reported on 02/25/2016    . OXYGEN Inhale 3 L/min into the lungs continuous. Patient uses nightly    . potassium chloride SA (K-DUR,KLOR-CON) 20 MEQ tablet Take 4 tablets (80 mEq total) by mouth 2 (two) times daily. 720 tablet 3  . torsemide (DEMADEX) 100 MG tablet Take 100 mg by mouth 2 (two) times daily.    Marland Kitchen ULORIC 80 MG TABS Take 1 tablet by mouth daily.     . Vitamin D, Ergocalciferol, (DRISDOL) 50000 UNITS CAPS capsule Take 50,000 Units by mouth every Monday.    Penne Lash HFA 45 MCG/ACT inhaler Inhale 2 puffs into the lungs every 4 (four) hours as needed for wheezing or shortness of breath. 1 Inhaler 1  . zolpidem (AMBIEN) 10 MG tablet Take 10 mg by mouth at bedtime.      No current facility-administered medications for this visit.   Allergies  Allergen Reactions  . Adhesive [Tape] Itching    EKG leads  . Sulfa Antibiotics Itching and Nausea And Vomiting    everything I seen was red  . Codeine Nausea And Vomiting  . Penicillins Nausea And Vomiting     Review of Systems: All systems reviewed and negative except where noted in HPI.   Lab Results  Component Value Date   WBC 5.8 02/25/2016   HGB 14.4 02/25/2016   HCT 43.3 02/25/2016   MCV 95.4 02/25/2016   PLT 175 02/25/2016    Lab Results  Component Value Date   ALT 25 02/25/2016   AST 29 02/25/2016   ALKPHOS 79 02/25/2016   BILITOT 0.5 02/25/2016    Physical Exam: BP 132/72 mmHg  Pulse 74  Ht 5\' 4"  (1.626 m)  Wt 258 lb 12.8 oz (117.391 kg)   BMI 44.40 kg/m2 Constitutional: Pleasant,well-developed, female in no acute distress, wearing oxygen, sitting in wheelchair HEENT: Normocephalic and atraumatic. Conjunctivae are normal. No scleral icterus. Neck supple.  Cardiovascular: Normal rate, regular rhythm.  Pulmonary/chest: Effort normal and breath sounds normal, scattered course BS B Abdominal: Soft, obese / protuberant, ontender. Bowel sounds active throughout.  Extremities: no edema Lymphadenopathy: No cervical adenopathy noted. Neurological: Alert and oriented to person place and time. Skin: Skin is warm and dry. No rashes noted. Psychiatric: Normal mood and affect. Behavior is normal.   ASSESSMENT AND PLAN: 70 y/o female with multiple comorbidities on oxygen and Eliquis, presenting with ongoing symptoms of GERD, globus, and dysphagia, with concern that reflux may be related to underlying lung disease.   She has had a prior EGD x 2, showing a very short segment of [name]'s that is a low risk lesion for progression to cancer (irregular z-line). She has had a barium swallow in 2015 showing changes concerning for severe dysmotility. I suspect she most likely has esophageal dysmotility causing dysphagia, symptoms of globus, and reflux. It is also possible she has component of oropharyngeal dysphagia / aspiration based on her symptoms as well. She is higher than average risk for EGD, and I think it is unlikely [name]'s has progressed to malignancy causing dysphagia. That being said, recommend barium swallow initially to ensure no stenosis/mass lesion appreciated, and can reassess motility and for aspiration changes. She may warrant modified barium swallow based on this study if concern for oropharyngeal dysfunction. I think she will ultimately warrant esophageal manometry to ensure no evidence of achalasia, but will await initial barium study. If there is  a stenosis noted on barium study, or other abnormality that warrants endoscopic  evaluation I will discuss that with her. She wishes to avoid endoscopy at this time given her comorbidities which I agree with, and I don't think her [name]'s warrants aggressive surveillance at this time given the low risk nature of this lesion (guidelines recommend every 3 to 5 years). In the interim we will increase her nexium to 40mg  BID to see if this helps some of her symptoms. Further recommendations pending barium study and her course.   Plover Cellar, MD [facility] Pager [phone]  CC: [name], MD

## 2016-03-10 ENCOUNTER — Ambulatory Visit (HOSPITAL_COMMUNITY): Payer: PPO

## 2016-03-10 ENCOUNTER — Ambulatory Visit (HOSPITAL_COMMUNITY)
Admission: RE | Admit: 2016-03-10 | Discharge: 2016-03-10 | Disposition: A | Discharge status: Home / Self Care | Payer: PPO | Source: Ambulatory Visit | Attending: Internal Medicine | Admitting: Internal Medicine

## 2016-03-10 DIAGNOSIS — I5032 Chronic diastolic (congestive) heart failure: Secondary | ICD-10-CM | POA: Diagnosis not present

## 2016-03-10 LAB — BASIC METABOLIC PANEL
Anion gap: 10 (ref 5–15)
BUN: 48 mg/dL — ABNORMAL HIGH (ref 6–20)
CO2: 28 mmol/L (ref 22–32)
Calcium: 9.4 mg/dL (ref 8.9–10.3)
Chloride: 101 mmol/L (ref 101–111)
Creatinine, Ser: 3.6 mg/dL — ABNORMAL HIGH (ref 0.44–1.00)
GFR calc Af Amer: 14 mL/min — ABNORMAL LOW (ref >60)
GFR calc non Af Amer: 12 mL/min — ABNORMAL LOW (ref >60)
Glucose, Bld: 162 mg/dL — ABNORMAL HIGH (ref 65–99)
Potassium: 3.6 mmol/L (ref 3.5–5.1)
Sodium: 139 mmol/L (ref 135–145)

## 2016-03-11 ENCOUNTER — Other Ambulatory Visit (HOSPITAL_COMMUNITY): Payer: Self-pay | Admitting: Internal Medicine

## 2016-03-14 ENCOUNTER — Other Ambulatory Visit (HOSPITAL_COMMUNITY): Payer: PPO

## 2016-03-17 DIAGNOSIS — E669 Obesity, unspecified: Secondary | ICD-10-CM | POA: Diagnosis not present

## 2016-03-19 DIAGNOSIS — G4733 Obstructive sleep apnea (adult) (pediatric): Secondary | ICD-10-CM | POA: Diagnosis not present

## 2016-03-19 DIAGNOSIS — I509 Heart failure, unspecified: Secondary | ICD-10-CM | POA: Diagnosis not present

## 2016-03-20 DIAGNOSIS — R0602 Shortness of breath: Secondary | ICD-10-CM | POA: Diagnosis not present

## 2016-03-23 ENCOUNTER — Other Ambulatory Visit (HOSPITAL_COMMUNITY): Payer: Self-pay | Admitting: Internal Medicine

## 2016-03-27 DIAGNOSIS — I509 Heart failure, unspecified: Secondary | ICD-10-CM | POA: Diagnosis not present

## 2016-03-31 ENCOUNTER — Ambulatory Visit (HOSPITAL_COMMUNITY)
Admission: RE | Admit: 2016-03-31 | Discharge: 2016-03-31 | Disposition: A | Discharge status: Home / Self Care | Payer: PPO | Source: Ambulatory Visit | Attending: Gastroenterology | Admitting: Gastroenterology

## 2016-03-31 DIAGNOSIS — K219 Gastro-esophageal reflux disease without esophagitis: Secondary | ICD-10-CM | POA: Diagnosis not present

## 2016-03-31 DIAGNOSIS — R1310 Dysphagia, unspecified: Secondary | ICD-10-CM | POA: Diagnosis not present

## 2016-03-31 DIAGNOSIS — K224 Dyskinesia of esophagus: Secondary | ICD-10-CM | POA: Insufficient documentation

## 2016-04-03 ENCOUNTER — Other Ambulatory Visit (HOSPITAL_COMMUNITY): Payer: Self-pay | Admitting: Cardiology

## 2016-04-04 ENCOUNTER — Encounter (HOSPITAL_COMMUNITY): Payer: Self-pay

## 2016-04-04 ENCOUNTER — Ambulatory Visit (HOSPITAL_COMMUNITY)
Admission: RE | Admit: 2016-04-04 | Discharge: 2016-04-04 | Disposition: A | Discharge status: Home / Self Care | Payer: PPO | Source: Ambulatory Visit | Attending: Cardiology | Admitting: Cardiology

## 2016-04-04 VITALS — BP 130/80 | HR 60 | Wt 248.5 lb

## 2016-04-04 DIAGNOSIS — I130 Hypertensive heart and chronic kidney disease with heart failure and stage 1 through stage 4 chronic kidney disease, or unspecified chronic kidney disease: Secondary | ICD-10-CM | POA: Diagnosis not present

## 2016-04-04 DIAGNOSIS — I739 Peripheral vascular disease, unspecified: Secondary | ICD-10-CM | POA: Diagnosis not present

## 2016-04-04 DIAGNOSIS — Z79899 Other long term (current) drug therapy: Secondary | ICD-10-CM | POA: Insufficient documentation

## 2016-04-04 DIAGNOSIS — M1990 Unspecified osteoarthritis, unspecified site: Secondary | ICD-10-CM | POA: Diagnosis not present

## 2016-04-04 DIAGNOSIS — J45909 Unspecified asthma, uncomplicated: Secondary | ICD-10-CM | POA: Diagnosis not present

## 2016-04-04 DIAGNOSIS — Z8249 Family history of ischemic heart disease and other diseases of the circulatory system: Secondary | ICD-10-CM | POA: Diagnosis not present

## 2016-04-04 DIAGNOSIS — I252 Old myocardial infarction: Secondary | ICD-10-CM | POA: Diagnosis not present

## 2016-04-04 DIAGNOSIS — I4892 Unspecified atrial flutter: Secondary | ICD-10-CM | POA: Diagnosis not present

## 2016-04-04 DIAGNOSIS — G4733 Obstructive sleep apnea (adult) (pediatric): Secondary | ICD-10-CM | POA: Insufficient documentation

## 2016-04-04 DIAGNOSIS — M109 Gout, unspecified: Secondary | ICD-10-CM | POA: Insufficient documentation

## 2016-04-04 DIAGNOSIS — Z6841 Body Mass Index (BMI) 40.0 and over, adult: Secondary | ICD-10-CM | POA: Diagnosis not present

## 2016-04-04 DIAGNOSIS — E1122 Type 2 diabetes mellitus with diabetic chronic kidney disease: Secondary | ICD-10-CM | POA: Diagnosis not present

## 2016-04-04 DIAGNOSIS — Z87891 Personal history of nicotine dependence: Secondary | ICD-10-CM | POA: Insufficient documentation

## 2016-04-04 DIAGNOSIS — I4891 Unspecified atrial fibrillation: Secondary | ICD-10-CM

## 2016-04-04 DIAGNOSIS — I5032 Chronic diastolic (congestive) heart failure: Secondary | ICD-10-CM | POA: Insufficient documentation

## 2016-04-04 DIAGNOSIS — I495 Sick sinus syndrome: Secondary | ICD-10-CM | POA: Insufficient documentation

## 2016-04-04 DIAGNOSIS — Z9981 Dependence on supplemental oxygen: Secondary | ICD-10-CM | POA: Diagnosis not present

## 2016-04-04 DIAGNOSIS — J849 Interstitial pulmonary disease, unspecified: Secondary | ICD-10-CM | POA: Diagnosis not present

## 2016-04-04 DIAGNOSIS — I2510 Atherosclerotic heart disease of native coronary artery without angina pectoris: Secondary | ICD-10-CM | POA: Insufficient documentation

## 2016-04-04 DIAGNOSIS — K219 Gastro-esophageal reflux disease without esophagitis: Secondary | ICD-10-CM | POA: Insufficient documentation

## 2016-04-04 DIAGNOSIS — E785 Hyperlipidemia, unspecified: Secondary | ICD-10-CM | POA: Diagnosis not present

## 2016-04-04 DIAGNOSIS — Z794 Long term (current) use of insulin: Secondary | ICD-10-CM | POA: Insufficient documentation

## 2016-04-04 DIAGNOSIS — Z950 Presence of cardiac pacemaker: Secondary | ICD-10-CM | POA: Insufficient documentation

## 2016-04-04 DIAGNOSIS — N184 Chronic kidney disease, stage 4 (severe): Secondary | ICD-10-CM | POA: Insufficient documentation

## 2016-04-04 MED ORDER — AMIODARONE HCL 200 MG PO TABS: 100.0000 mg | ORAL_TABLET | Freq: Every day | ORAL | 3 refills | Status: DC

## 2016-04-04 NOTE — Progress Notes (Signed)
Patient ID: [name], female   DOB: Dec 03, 1945, 70 y.o.   MRN: [id number] PCP: Dr. Alyson Ingles  Nephrologist: Dr. Florene Glen Cardiology: Dr Aundra Dubin   HPI: [name] is a 70 year old woman with hypertension, hyperlipidemia, bronchial asthma, diabetes mellitus, CKD stage IV (baseline cr ~2.5), atrial fibrillation and diastolic HF.  She was admitted in 4/15 for R TKR. That hospitalization complicated by a/c diastolic HF with respiratory distress requiring non-rebreather support. Weight on discharge was 226 pounds. ABG at that time 7.4/34/91/97%. Cr peaked at 2.9  Admitted 6/30-7/10/15 for SOB and CP following scheduled cardioversion. Found to have severe aspiration PNA and was intubated. She maintained SR for short period of time and then went back into Afib/Aflutter.  HR controlled on amio and diltiazem. [facility] to [facility] at a weight of 232 lbs.   Admitted 06/16/14 with symptomatic tachy/brady syndrome. 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 Discharge weight was 223 pounds.   Admitted 02/16/15 for symptomatic anemia s/p blood work from her pulmonologist.  Was diuresed with lasix while in hospital.   Last echo in 6/16 with EF 60-65%, PASP 37 mmHg.   PFTs (6/16) with FVC 80%, FEV1 83%, ratio 103%, TLC 75%, DLCO 78% => restriction c/w interstitial process.  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   Holter (7/16) with rare PACs, PVCs; no atrial fibrillation.   After her CT showing interstitial fibrosis, she saw pulmonary. There was concern for connective tissue disease with lung involvement.  She has seen rheumatology and was told that she likely has rheumatoid arthritis.  The pulmonologist did not think that her ILD was due to amiodarone, so she is still on amiodarone.    At last appointment, I increased metolazone. She is down about 8 lbs.  She continues to have joint pain with knees and ankles  hurting.  She remains stably short of breath with most exertion, including walking around the house.  This really has not changed much with diuresis.  Very fatigued.  Continues to wear home oxygen and uses CPAP at night.  No chest pain. No palpitations. No melena/BRBPR.   ECG: NSR, LVH with repolarization abnormality.   Labs: 03/13/14: K+ 3.4, creatinine 2.10, BUN 16 04/02/14: K 5.6, creatinine 2.66, BUN 29 04/13/14: K 4.0, creatinine 2.66, K 5.6 06/20/14 K 3.7 creatinine 2.35  07/21/14 K 3.6 creatinine 2.55 2/16 K 3.8, creatinine 3.03, LFTs normal, TSH normal, hgb 12.8  5/16 K 3.5, creatinine 3.37, LFTs normal, TSH mildly elevated, T3 just below normal, T4 normal. BNP 147 6/16 K 3.9, creatinine 2.98, BNP 364.2, SCL-70 negative, RF elevated but CCP negative, ANA negative, ESR 48.  7/16 K 3.4, creatinine 3.31 LFTs normal, TSH and T4 normal, T3 just below normal, HCT 42.4 11/16: K 3.5, creatinine 3.4, HCT 45.1 2/17: K 3, creatinine 3.22, LFTs normal, TSH normal, HCT 46.9 7/17: K 3.6, creatinine 3.6, HCT 43.3, LFTs normal, TSH normal  ROS: All systems negative except as listed in HPI, PMH and Problem List.  SH:  Social History   Social History  . Marital status: Widowed    Spouse name: N/A  . Number of children: 3  . Years of education: N/A   Occupational History  . Disabled    Social History Main Topics  . Smoking status: Former Smoker    Packs/day: 0.50    Years: 35.00    Types: Cigarettes    Quit date: 02/16/1995  . Smokeless tobacco: Never  Used     Comment: 05/2014  QUIT OVER 20 YEARS AGO "  . Alcohol use No  . Drug use: No  . Sexual activity: Not Currently   Other Topics Concern  . Not on file   Social History Narrative  . No narrative on file    FH:  Family History  Problem Relation Age of Onset  . Heart disease Mother   . Bladder Cancer Mother   . Kidney disease Mother   . Ovarian cancer Daughter   . Stomach cancer Maternal Uncle   . Colon cancer Maternal  Aunt     dx in her 82's  . Esophageal cancer Neg Hx     Past Medical History:  Diagnosis Date  . Anginal pain ([place])    occ; non-ischemic Lexiscan 09/2012  . Arthritis   . Asthma   . Atrial flutter ([place])    ablated by Dr Lovena Le in 2008  . CKD (chronic kidney disease) 04/2007   CKD stage 4(Dr. Erling Cruz)  . Complication of anesthesia     DIFFICULTY BREATHING   . Coronary atherosclerosis of native coronary artery   . Diastolic heart failure 05/1659   grade 2 diastolic dysfunction per 02/44 echo  . Esophageal dysmotility 2008   noted on esophagram.  hx dysphagia.   . Fatty liver 2008   noted on ultrasound 2008  . Fatty tumor fatty tumor back  . GERD (gastroesophageal reflux disease) 2012   Barrets esophagus on bx 2012 and 2014.   Marland Kitchen Gouty arthropathy   . Heart murmur   . Hyperlipidemia   . Hypertension   . IDDM (insulin dependent diabetes mellitus) ([place])    type 2.   . Morbid obesity ([place])   . Myocardial infarction ([place]) 2009  . Peripheral vascular disease ([place])   . Persistent atrial fibrillation (HCC)    chads2 vasc score of at least 5  . Sick sinus syndrome ([place])   . Sleep apnea    wears CPAP   Current Outpatient Prescriptions  Medication Sig Dispense Refill  . albuterol (PROVENTIL) (2.5 MG/3ML) 0.083% nebulizer solution Take 2.5 mg by nebulization every 6 (six) hours as needed for shortness of breath. Use four times a day as needed for shortness of breath or wheezing    . amiodarone (PACERONE) 200 MG tablet Take 0.5 tablets (100 mg total) by mouth daily. 30 tablet 3  . amitriptyline (ELAVIL) 25 MG tablet Take 25 mg by mouth at bedtime.      Marland Kitchen apixaban (ELIQUIS) 5 MG TABS tablet Take 1 tablet (5 mg total) by mouth 2 (two) times daily. 60 tablet 6  . dicyclomine (BENTYL) 10 MG capsule Take 10 mg by mouth daily.     Marland Kitchen diltiazem (CARDIZEM CD) 180 MG 24 hr capsule Take 1 capsule (180 mg total) by mouth daily. 30 capsule 3  . esomeprazole (NEXIUM) 40 MG capsule Take 1  capsule (40 mg total) by mouth 2 (two) times daily before a meal. 60 capsule 3  . hydrALAZINE (APRESOLINE) 25 MG tablet TAKE ONE TABLET BY MOUTH THREE TIMES DAILY 90 tablet 3  . insulin glargine (LANTUS) 100 UNIT/ML injection Inject 60 Units into the skin every morning.     . insulin lispro (HUMALOG) 100 UNIT/ML injection Inject 40 Units into the skin 3 (three) times daily with meals. Reported on 01/14/2016    . levothyroxine (SYNTHROID, LEVOTHROID) 25 MCG tablet Take 1 tablet by mouth daily.    Marland Kitchen Linaclotide (LINZESS) [place]  capsule Take 290 mcg by mouth daily. Does 145 mcg most days and then 266mg occ. To aid bowels    . lovastatin (MEVACOR) 10 MG tablet Take 10 mg by mouth at bedtime.    . metolazone (ZAROXOLYN) 2.5 MG tablet Take 1 tablet (2.5 mg total) by mouth 2 (two) times a week. Tuesday and Saturday 10 tablet 6  . metoprolol tartrate (LOPRESSOR) 25 MG tablet TAKE THREE TABLETS BY MOUTH TWICE DAILY 180 tablet 0  . omeprazole (PRILOSEC) 40 MG capsule Take 1 capsule (40 mg total) by mouth 2 (two) times daily before a meal. 120 capsule 3  . OXYGEN Inhale 3 L/min into the lungs continuous. Patient uses nightly    . potassium chloride SA (K-DUR,KLOR-CON) 20 MEQ tablet Take 4 tablets (80 mEq total) by mouth 2 (two) times daily. 720 tablet 3  . torsemide (DEMADEX) 100 MG tablet Take 100 mg by mouth 2 (two) times daily.    .Marland KitchenULORIC 80 MG TABS Take 1 tablet by mouth daily.     . Vitamin D, Ergocalciferol, (DRISDOL) 50000 UNITS CAPS capsule Take 50,000 Units by mouth every Monday.    .Penne LashHFA 45 MCG/ACT inhaler Inhale 2 puffs into the lungs every 4 (four) hours as needed for wheezing or shortness of breath. 1 Inhaler 1  . zolpidem (AMBIEN) 10 MG tablet Take 10 mg by mouth at bedtime.     . nitroGLYCERIN (NITROSTAT) 0.4 MG SL tablet Place 0.4 mg under the tongue every 5 (five) minutes as needed for chest pain (x 3 doses). Reported on 02/25/2016     No current facility-administered  medications for this encounter.     Vitals:   04/04/16 1000  BP: 130/80  Pulse: 60  SpO2: 97%  Weight: 248 lb 8 oz (112.7 kg)    PHYSICAL EXAM: General: Obese, NAD, in wheelchair. Daughter present HEENT: normal  Neck: supple. JVP 7 cm; Carotids 2+ bilat; no bruits. No lymphadenopathy or thryomegaly  Cor: PMI nonpalpable. Distant. Regular rate. No murmur appreciated Lungs: Slight crackles at bases bilaterally.  Abdomen: Obese, soft, nontender. mild-distended. No hepatosplenomegaly. No bruits or masses. +BS Extremities: no cyanosis, clubbing, rash, warm. No edema.   Neuro: alert & orientedx3, cranial nerves grossly intact. moves all 4 extremities w/o difficulty. Affect pleasant   ASSESSMENT & PLAN:  1) Chronic diastolic HF: EF 621-19%(64/17echo). NYHA class IIIb symptoms, stable.  Weight is down with increased metolazone dosing.  Dyspnea likely has a prominent pulmonary component with restrictive PFTs from body habitus and interstitial lung disease (NSIP pattern by high resolution CT).  She is on torsemide 100 mg bid with metolazone twice a week.  - Check BMET today.   - Continue torsemide 100 mg bid.  - Continue metolazone twice a week on Saturday and Tuesday.      2) Afib/Aflutter:  She remains in NSR on amiodarone.  I had her see pulmonary because of interstitial lung disease, this is not thought to be due to amiodarone.   - Given lung disease and with stable rhythm, I will decrease amiodarone to 100 mg daily. - LFTs and TSH recently normal.  She will need regular eye exams on amiodarone.  - No bleeding problems on Eliquis (h/o cecal AVM s/p ablation).  Continue Eliquis for now.  If renal function worsens any, will have to switch to coumadin.  3) CKD stage IV: BMET today. Sees nephrology.  4) Tachy/Brady syndrome: St Jude PPM.   5) OSA: Use CPAP. 6)  Interstitial lung disease: Has ILD, may be RA-related.  Has seen Dr Amil Amen and follows with Dr Halford Chessman for pulmonology.  She is on home  oxygen.    Followup in 3 months.     [name] 04/04/2016

## 2016-04-04 NOTE — Patient Instructions (Signed)
Decrease Amiodarone to 100 mg (1/2 tab) daily  Lab today  We will contact you in 3 months to schedule your next appointment.

## 2016-04-05 ENCOUNTER — Other Ambulatory Visit: Payer: Self-pay | Admitting: *Deleted

## 2016-04-05 DIAGNOSIS — R1310 Dysphagia, unspecified: Secondary | ICD-10-CM

## 2016-04-06 ENCOUNTER — Telehealth (HOSPITAL_COMMUNITY): Payer: Self-pay | Admitting: *Deleted

## 2016-04-06 ENCOUNTER — Other Ambulatory Visit (HOSPITAL_COMMUNITY): Payer: Self-pay | Admitting: Internal Medicine

## 2016-04-06 MED ORDER — ATORVASTATIN CALCIUM 40 MG PO TABS: 40.0000 mg | ORAL_TABLET | Freq: Every day | ORAL | 0 refills | Status: DC

## 2016-04-06 MED ORDER — AMIODARONE HCL 100 MG PO TABS: 100.0000 mg | ORAL_TABLET | Freq: Every day | ORAL | 6 refills | Status: DC

## 2016-04-06 NOTE — Telephone Encounter (Signed)
Pt called to request a refill on her Atorvastatin and a new rx for Amio 100 mg tabs.  However, Lovastatin 10 mg is on pt's med list, she reports she does not know anything about this medication and has been on Atorvastatin 40 mg daily for a long time.  Upon review of chart it appears atorva was taken off med list and Lovastatin was added on 02/25/16 however there is no mention of this happening in the note.  Reviewed meds w/Erika, pharm D, unsure why med change was made Atorva is safe for pt with other meds at 40 mg.  New rx for Atorva 40 mg sent to pharmacy along with rx for amio, pt aware

## 2016-04-07 ENCOUNTER — Ambulatory Visit: Payer: PPO | Admitting: Pulmonary Disease

## 2016-04-08 ENCOUNTER — Other Ambulatory Visit (HOSPITAL_COMMUNITY): Payer: Self-pay | Admitting: Cardiology

## 2016-04-13 ENCOUNTER — Other Ambulatory Visit (HOSPITAL_COMMUNITY): Payer: Self-pay | Admitting: Gastroenterology

## 2016-04-13 DIAGNOSIS — R1310 Dysphagia, unspecified: Secondary | ICD-10-CM

## 2016-04-19 DIAGNOSIS — G4733 Obstructive sleep apnea (adult) (pediatric): Secondary | ICD-10-CM | POA: Diagnosis not present

## 2016-04-19 DIAGNOSIS — I509 Heart failure, unspecified: Secondary | ICD-10-CM | POA: Diagnosis not present

## 2016-04-20 DIAGNOSIS — R0602 Shortness of breath: Secondary | ICD-10-CM | POA: Diagnosis not present

## 2016-04-21 ENCOUNTER — Encounter: Payer: Self-pay | Admitting: Adult Health

## 2016-04-21 ENCOUNTER — Ambulatory Visit (HOSPITAL_COMMUNITY)
Admission: RE | Admit: 2016-04-21 | Discharge: 2016-04-21 | Disposition: A | Discharge status: Home / Self Care | Payer: PPO | Source: Ambulatory Visit | Attending: Gastroenterology | Admitting: Gastroenterology

## 2016-04-21 ENCOUNTER — Ambulatory Visit (INDEPENDENT_AMBULATORY_CARE_PROVIDER_SITE_OTHER): Payer: PPO | Admitting: Adult Health

## 2016-04-21 DIAGNOSIS — G4733 Obstructive sleep apnea (adult) (pediatric): Secondary | ICD-10-CM | POA: Diagnosis not present

## 2016-04-21 DIAGNOSIS — J8410 Pulmonary fibrosis, unspecified: Secondary | ICD-10-CM

## 2016-04-21 DIAGNOSIS — R1310 Dysphagia, unspecified: Secondary | ICD-10-CM | POA: Diagnosis present

## 2016-04-21 DIAGNOSIS — K219 Gastro-esophageal reflux disease without esophagitis: Secondary | ICD-10-CM | POA: Diagnosis not present

## 2016-04-21 DIAGNOSIS — J9611 Chronic respiratory failure with hypoxia: Secondary | ICD-10-CM

## 2016-04-21 NOTE — Addendum Note (Signed)
Addended by: Osa Craver on: 04/21/2016 12:08 PM   Modules accepted: Orders

## 2016-04-21 NOTE — Assessment & Plan Note (Signed)
Cont on O2 .  

## 2016-04-21 NOTE — Patient Instructions (Addendum)
Continue on O2 at 3l/m  With activity .  Continue on CPAP At bedtime  With oxygen at 3l/m .  Change CPAP mask to nasal mask.  follow up Dr. Halford Chessman  In 2 months and As needed   Please contact office for sooner follow up if symptoms do not improve or worsen or seek emergency care

## 2016-04-21 NOTE — Assessment & Plan Note (Signed)
ILD changes on CT -complicated case with probable RA , uncontrolled GERD despite max therapy, and ongoing Amiodarone therapy . She has multiple comorbidities as well .  PFT in March stable from 2016 . CT chest in Feb stable for 6 months . Continue with GI follow up to help with better control of GERD .  Continue follow up with Rhematology for RA.  Feel probably too high risk for open lung bx at this time. Will discuss in more detail with Dr. Halford Chessman   Will also discuss ongoing use of amiodarone as we do not know the underlying cause of her ILD changes.

## 2016-04-21 NOTE — Assessment & Plan Note (Signed)
Well controlled on CPAP  Change to nasal mask   Plan  Patient Instructions  Continue on O2 at 3l/m  With activity .  Continue on CPAP At bedtime  With oxygen at 3l/m .  Change CPAP mask to nasal mask.  follow up Dr. Halford Chessman  In 2 months and As needed   Please contact office for sooner follow up if symptoms do not improve or worsen or seek emergency care

## 2016-04-21 NOTE — Progress Notes (Signed)
Subjective:    Patient ID: [name], female    DOB: [birth year], 70 y.o.   MRN: [id number]  HPIDiane B [name] is a 70 y.o. female former smoker with aspiration PNA . OSA  Gr 2 DCHF ,    Tests: Echo 03/06/14 >> mild LVH, EF 60 to 65% Echo 02/2015 with Gr 2 D CHF , PAP 38 , EF 60%.  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  Serology 02/16/15 >> anti CCP negative, RF 23, anti Jo negative, Scl 70 negative, SSa/SSb negative, ANA negative, ESR 48 PFT 11/19/15 >> FEV1 1.85 (100%), FEV1% 83, TLC 5.44 (107%), RV 152%, DLCO 30%, borderline BD  04/21/2016   Follow up : ILD/OSA  Pt returns for 3 month follow up . She is followed for presumed ILD changes on CT .  CT chest was repeated on 2/24 showed stable 7 mm nodule in lingula . Stable interstitial patten in the lingula.  Remains on On oxygen with act and At bedtime with 3l/m  Previous labs showed +RA factor. Pt Was recently seen Dr. Amil [name] with Rheumatology.   Marland Kitchen  She was felt to have a polyarthralgia and positive rheumatoid factor along with her interstitial lung disease.-His notes indicated that most likely she does have rheumatoid arthritis. Her case is complicated because of her underlying chronic gout medications, stage IV chronic kidney disease. It makes treatment options for rheumatoid arthritis difficult. She was given prednisone, which helped her joint pain but did not relieve any of her respiratory symptoms.  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  Cytology was neg for malignant cells. BAL cultures were neg .   Concern for GERD contributing to her lung findings. She complained of  reflux into throat at times. She was referred to GI , Seen by GI on 03/03/16 , notes reviewed.  Nexium was increased to Twice daily  . She was set up for a Barium swallow that showed esophageal dysmotility but no stricture. She was set up for 24hr Ph probe and MBS that is pending by GI.   Says her GERD is unchanged.   She has sleep apnea on nocturnal C Pap. Download shows excellent compliance on a set pressure of 10 cm of H2O. She wears on average of 4.5 hours. AHI 4.5. Positive leaks. Says she does not like the mask. It does not feel well .  She is very limited by activity as she gets winded with minimal walking. Does not have any sob at rest.   She is followed by Cardiology for  CHF and atrial Fib on amiodarone and Eliquis  She denies any hemoptysis, chest pain, orthopnea, PND, or increased leg swelling.  Marland Kitchen  [name] 2012 and Prevnar 2015 are utd.   Past Medical History:  Diagnosis Date  . Anginal pain ([place])    occ; non-ischemic Lexiscan 09/2012  . Arthritis   . Asthma   . Atrial flutter ([place])    ablated by Dr [name] in 2008  . CKD (chronic kidney disease) 04/2007   CKD stage 4(Dr. Erling [name])  . Complication of anesthesia     DIFFICULTY BREATHING   . Coronary atherosclerosis of native coronary artery   . Diastolic heart failure 10/6201   grade 2 diastolic dysfunction per 01/5973 echo  .  Esophageal dysmotility 2008   noted on esophagram.  hx dysphagia.   . Fatty liver 2008   noted on ultrasound 2008  . Fatty tumor fatty tumor back  . GERD (gastroesophageal reflux disease) 2012   Barrets esophagus on bx 2012 and 2014.   Marland Kitchen Gouty arthropathy   . Heart murmur   . Hyperlipidemia   . Hypertension   . 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   . Morbid obesity ([place])   . Myocardial infarction ([place]) 2009  . Peripheral vascular disease ([place])   . Persistent atrial fibrillation (H[facility])    chads2 vasc score of at least 5  . Sick sinus syndrome ([place])   . Sleep apnea    wears CPAP   Current Outpatient Prescriptions on File Prior to Visit  Medication Sig Dispense  Refill  . albuterol (PROVENTIL) (2.5 MG/3ML) 0.083% nebulizer solution Take 2.5 mg by nebulization every 6 (six) hours as needed for shortness of breath. Use four times a day as needed for shortness of breath or wheezing    . amiodarone (PACERONE) 100 MG tablet Take 1 tablet (100 mg total) by mouth daily. 30 tablet 6  . amitriptyline (ELAVIL) 25 MG tablet Take 25 mg by mouth at bedtime.      Marland Kitchen apixaban (ELIQUIS) 5 MG TABS tablet Take 1 tablet (5 mg total) by mouth 2 (two) times daily. 60 tablet 6  . atorvastatin (LIPITOR) 40 MG tablet Take 1 tablet (40 mg total) by mouth daily. 90 tablet 0  . dicyclomine (BENTYL) 10 MG capsule Take 10 mg by mouth daily.     Marland Kitchen diltiazem (CARDIZEM CD) 180 MG 24 hr capsule Take 1 capsule (180 mg total) by mouth daily. 30 capsule 3  . esomeprazole (NEXIUM) 40 MG capsule Take 1 capsule (40 mg total) by mouth 2 (two) times daily before a meal. 60 capsule 3  . hydrALAZINE (APRESOLINE) 25 MG tablet TAKE ONE TABLET BY MOUTH THREE TIMES DAILY 90 tablet 3  . insulin glargine (LANTUS) 100 UNIT/ML injection Inject 60 Units into the skin every morning.     . insulin lispro (HUMALOG) 100 UNIT/ML injection Inject 40 Units into the skin 3 (three) times daily with meals. Reported on 01/14/2016    . levothyroxine (SYNTHROID, LEVOTHROID) 25 MCG tablet Take 1 tablet by mouth daily.    . Linaclotide (LINZESS) 145 MCG CAPS capsule Take 290 mcg by mouth daily. Does 145 mcg most days and then 235mg occ. To aid bowels    . metolazone (ZAROXOLYN) 2.5 MG tablet Take 1 tablet (2.5 mg total) by mouth 2 (two) times a week. Tuesday and Saturday 10 tablet 6  . metoprolol tartrate (LOPRESSOR) 25 MG tablet TAKE THREE TABLETS BY MOUTH TWICE DAILY 180 tablet 0  . nitroGLYCERIN (NITROSTAT) 0.4 MG SL tablet Place 0.4 mg under the tongue every 5 (five) minutes as needed for chest pain (x 3 doses). Reported on 02/25/2016    . omeprazole (PRILOSEC) 40 MG capsule Take 1 capsule (40 mg total) by mouth 2 (two)  times daily before a meal. 120 capsule 3  . OXYGEN Inhale 3 L/min into the lungs continuous. Patient uses nightly    . potassium chloride SA (K-DUR,KLOR-CON) 20 MEQ tablet Take 4 tablets (80 mEq total) by mouth 2 (two) times daily. 720 tablet 3  . torsemide (DEMADEX) 100 MG tablet Take 100 mg by mouth 2 (two) times daily.    .Marland KitchenULORIC 80 MG TABS Take 1 tablet  by mouth daily.     . Vitamin D, Ergocalciferol, (DRISDOL) 50000 UNITS CAPS capsule Take 50,000 Units by mouth every Monday.    Penne Lash HFA 45 MCG/ACT inhaler Inhale 2 puffs into the lungs every 4 (four) hours as needed for wheezing or shortness of breath. 1 Inhaler 1  . zolpidem (AMBIEN) 10 MG tablet Take 10 mg by mouth at bedtime.      No current facility-administered medications on file prior to visit.        Review of Systems Constitutional:   No  weight loss, night sweats,  Fevers, chills,  +fatigue, or  lassitude.  HEENT:   No headaches,  Difficulty swallowing,  Tooth/dental problems, or  Sore throat,                No sneezing, itching, ear ache, nasal congestion, post nasal drip,   CV:  No chest pain,  Orthopnea, PND, swelling in lower extremities, anasarca, dizziness, palpitations, syncope.   GI  +GERD .   Resp:    No chest wall deformity  Skin: no rash or lesions.  GU: no dysuria, change in color of urine, no urgency or frequency.  No flank pain, no hematuria   MS:  No joint pain or swelling.  No decreased range of motion.  No back pain.  Psych:  No change in mood or affect. No depression or anxiety.  No memory loss.         Objective:   Physical Exam   Vitals:   04/21/16 1107  BP: 126/78  Pulse: 60  Temp: 98 F (36.7 C)  TempSrc: Oral  SpO2: 99%  Weight: 256 lb (116.1 kg)  Height: 5' 4"  (1.626 m)  Body mass index is 43.94 kg/m.    GEN: A/Ox3; pleasant , NAD, elderly , obese , Chronically ill appearing in wheelchair  HEENT:  [place]/AT,  EACs-clear, TMs-wnl, NOSE-clear, THROAT-clear, no lesions, no  postnasal drip or exudate noted.  Class 3 MP airway   NECK:  Supple w/ fair ROM; no JVD; normal carotid impulses w/o bruits; no thyromegaly or nodules palpated; no lymphadenopathy.    RESP  Decreased BS in bases . no accessory muscle use, no dullness to percussion  CARD:  RRR, no m/r/g  , tr  peripheral edema, pulses intact, no cyanosis or clubbing.  GI:   Soft & nt; nml bowel sounds; no organomegaly or masses detected.   Musco: Warm bil, no deformities. Arthritic changes in hands   Neuro: alert, no focal deficits noted.    Skin: Warm, no lesions or rashes    CT chest 10/29/15  Stable 7 mm pulmonary nodule in the lingula. Recommend follow-up CT without contrast in 12 months from current month to demonstrate benignity per Fleischner criteria 2. Stable reticular interstitial pattern in the lingula.    Shilee Biggs NP-C  [facility]   04/21/2016

## 2016-04-25 NOTE — Progress Notes (Signed)
Reviewed and agree with assessment/plan.  Chesley Mires, MD [facility] Pulmonary/Critical Care 04/25/2016, 3:37 PM Pager:  [phone]

## 2016-04-27 DIAGNOSIS — I509 Heart failure, unspecified: Secondary | ICD-10-CM | POA: Diagnosis not present

## 2016-05-05 DIAGNOSIS — M1A09X0 Idiopathic chronic gout, multiple sites, without tophus (tophi): Secondary | ICD-10-CM | POA: Diagnosis not present

## 2016-05-05 DIAGNOSIS — J849 Interstitial pulmonary disease, unspecified: Secondary | ICD-10-CM | POA: Diagnosis not present

## 2016-05-17 DIAGNOSIS — G4733 Obstructive sleep apnea (adult) (pediatric): Secondary | ICD-10-CM | POA: Diagnosis not present

## 2016-05-20 DIAGNOSIS — G4733 Obstructive sleep apnea (adult) (pediatric): Secondary | ICD-10-CM | POA: Diagnosis not present

## 2016-05-20 DIAGNOSIS — I509 Heart failure, unspecified: Secondary | ICD-10-CM | POA: Diagnosis not present

## 2016-05-21 DIAGNOSIS — R0602 Shortness of breath: Secondary | ICD-10-CM | POA: Diagnosis not present

## 2016-05-28 DIAGNOSIS — I509 Heart failure, unspecified: Secondary | ICD-10-CM | POA: Diagnosis not present

## 2016-06-02 DIAGNOSIS — E1122 Type 2 diabetes mellitus with diabetic chronic kidney disease: Secondary | ICD-10-CM | POA: Diagnosis not present

## 2016-06-02 DIAGNOSIS — I5032 Chronic diastolic (congestive) heart failure: Secondary | ICD-10-CM | POA: Diagnosis not present

## 2016-06-02 DIAGNOSIS — M810 Age-related osteoporosis without current pathological fracture: Secondary | ICD-10-CM | POA: Diagnosis not present

## 2016-06-02 DIAGNOSIS — M109 Gout, unspecified: Secondary | ICD-10-CM | POA: Diagnosis not present

## 2016-06-09 DIAGNOSIS — E669 Obesity, unspecified: Secondary | ICD-10-CM | POA: Diagnosis not present

## 2016-06-09 DIAGNOSIS — F17211 Nicotine dependence, cigarettes, in remission: Secondary | ICD-10-CM | POA: Diagnosis not present

## 2016-06-09 DIAGNOSIS — E1122 Type 2 diabetes mellitus with diabetic chronic kidney disease: Secondary | ICD-10-CM | POA: Diagnosis not present

## 2016-06-09 DIAGNOSIS — I5032 Chronic diastolic (congestive) heart failure: Secondary | ICD-10-CM | POA: Diagnosis not present

## 2016-06-09 DIAGNOSIS — J449 Chronic obstructive pulmonary disease, unspecified: Secondary | ICD-10-CM | POA: Diagnosis not present

## 2016-06-09 DIAGNOSIS — I129 Hypertensive chronic kidney disease with stage 1 through stage 4 chronic kidney disease, or unspecified chronic kidney disease: Secondary | ICD-10-CM | POA: Diagnosis not present

## 2016-06-09 DIAGNOSIS — M810 Age-related osteoporosis without current pathological fracture: Secondary | ICD-10-CM | POA: Diagnosis not present

## 2016-06-19 ENCOUNTER — Other Ambulatory Visit: Payer: Self-pay | Admitting: Internal Medicine

## 2016-06-19 DIAGNOSIS — Z1231 Encounter for screening mammogram for malignant neoplasm of breast: Secondary | ICD-10-CM

## 2016-06-20 DIAGNOSIS — R0602 Shortness of breath: Secondary | ICD-10-CM | POA: Diagnosis not present

## 2016-06-22 DIAGNOSIS — E1142 Type 2 diabetes mellitus with diabetic polyneuropathy: Secondary | ICD-10-CM | POA: Diagnosis not present

## 2016-06-22 DIAGNOSIS — I10 Essential (primary) hypertension: Secondary | ICD-10-CM | POA: Diagnosis not present

## 2016-06-22 DIAGNOSIS — E1122 Type 2 diabetes mellitus with diabetic chronic kidney disease: Secondary | ICD-10-CM | POA: Diagnosis not present

## 2016-06-22 DIAGNOSIS — E1165 Type 2 diabetes mellitus with hyperglycemia: Secondary | ICD-10-CM | POA: Diagnosis not present

## 2016-06-27 DIAGNOSIS — E1165 Type 2 diabetes mellitus with hyperglycemia: Secondary | ICD-10-CM | POA: Diagnosis not present

## 2016-06-27 DIAGNOSIS — I509 Heart failure, unspecified: Secondary | ICD-10-CM | POA: Diagnosis not present

## 2016-06-28 ENCOUNTER — Ambulatory Visit: Payer: PPO | Admitting: Pulmonary Disease

## 2016-07-06 ENCOUNTER — Other Ambulatory Visit: Payer: Self-pay | Admitting: Gastroenterology

## 2016-07-06 ENCOUNTER — Other Ambulatory Visit (HOSPITAL_COMMUNITY): Payer: Self-pay | Admitting: Cardiology

## 2016-07-13 ENCOUNTER — Other Ambulatory Visit (HOSPITAL_COMMUNITY): Payer: Self-pay | Admitting: Internal Medicine

## 2016-07-21 DIAGNOSIS — R0602 Shortness of breath: Secondary | ICD-10-CM | POA: Diagnosis not present

## 2016-07-28 ENCOUNTER — Other Ambulatory Visit (HOSPITAL_COMMUNITY): Payer: Self-pay | Admitting: Cardiology

## 2016-07-28 DIAGNOSIS — I509 Heart failure, unspecified: Secondary | ICD-10-CM | POA: Diagnosis not present

## 2016-07-28 DIAGNOSIS — I5022 Chronic systolic (congestive) heart failure: Secondary | ICD-10-CM

## 2016-07-31 ENCOUNTER — Ambulatory Visit: Payer: PPO

## 2016-08-02 ENCOUNTER — Other Ambulatory Visit (HOSPITAL_COMMUNITY): Payer: Self-pay | Admitting: *Deleted

## 2016-08-02 DIAGNOSIS — I5022 Chronic systolic (congestive) heart failure: Secondary | ICD-10-CM

## 2016-08-02 MED ORDER — DILTIAZEM HCL ER COATED BEADS 180 MG PO CP24: 180.0000 mg | ORAL_CAPSULE | Freq: Every day | ORAL | 3 refills | Status: DC

## 2016-08-03 ENCOUNTER — Other Ambulatory Visit (HOSPITAL_COMMUNITY): Payer: Self-pay | Admitting: Cardiology

## 2016-08-04 DIAGNOSIS — M150 Primary generalized (osteo)arthritis: Secondary | ICD-10-CM | POA: Diagnosis not present

## 2016-08-04 DIAGNOSIS — J849 Interstitial pulmonary disease, unspecified: Secondary | ICD-10-CM | POA: Diagnosis not present

## 2016-08-04 DIAGNOSIS — M25562 Pain in left knee: Secondary | ICD-10-CM | POA: Diagnosis not present

## 2016-08-04 DIAGNOSIS — M2550 Pain in unspecified joint: Secondary | ICD-10-CM | POA: Diagnosis not present

## 2016-08-04 DIAGNOSIS — Z6841 Body Mass Index (BMI) 40.0 and over, adult: Secondary | ICD-10-CM | POA: Diagnosis not present

## 2016-08-04 DIAGNOSIS — M1A09X0 Idiopathic chronic gout, multiple sites, without tophus (tophi): Secondary | ICD-10-CM | POA: Diagnosis not present

## 2016-08-04 DIAGNOSIS — N184 Chronic kidney disease, stage 4 (severe): Secondary | ICD-10-CM | POA: Diagnosis not present

## 2016-08-07 ENCOUNTER — Ambulatory Visit: Payer: PPO | Admitting: Podiatry

## 2016-08-09 ENCOUNTER — Ambulatory Visit (INDEPENDENT_AMBULATORY_CARE_PROVIDER_SITE_OTHER): Payer: PPO | Admitting: Podiatry

## 2016-08-09 ENCOUNTER — Ambulatory Visit: Payer: PPO | Admitting: Podiatry

## 2016-08-09 ENCOUNTER — Encounter: Payer: Self-pay | Admitting: Podiatry

## 2016-08-09 VITALS — BP 141/83 | HR 68 | Resp 18 | Ht 64.0 in | Wt 256.0 lb

## 2016-08-09 DIAGNOSIS — E1159 Type 2 diabetes mellitus with other circulatory complications: Secondary | ICD-10-CM

## 2016-08-09 DIAGNOSIS — E1140 Type 2 diabetes mellitus with diabetic neuropathy, unspecified: Secondary | ICD-10-CM

## 2016-08-09 DIAGNOSIS — M79676 Pain in unspecified toe(s): Secondary | ICD-10-CM

## 2016-08-09 DIAGNOSIS — B351 Tinea unguium: Secondary | ICD-10-CM | POA: Diagnosis not present

## 2016-08-09 DIAGNOSIS — Q828 Other specified congenital malformations of skin: Secondary | ICD-10-CM

## 2016-08-09 NOTE — Progress Notes (Signed)
   Subjective:    Patient ID: [name], female    DOB: 1946/03/18, 70 y.o.   MRN: [id number]  HPI this patient presents to the office with chief complaint of painful nails, calluses and numbness in her feet. Patient states that the nails have grown thick and long are painful as she walks and wears her shoes. She also has a painful callus under the outside balls of both feet with the right. Callus, especially painful. She also says that she experiencing numbness and pain and itchiness through the bottom of both of her feet. Patient states that she is diabetic. She presents the office today for an evaluation and treatment of her feet    Review of Systems  Constitutional: Positive for appetite change, fatigue and unexpected weight change.  HENT: Positive for sinus pain and trouble swallowing.        Ringing in ears  Respiratory: Positive for cough, chest tightness, shortness of breath and wheezing.        Difficulty breathing  Endocrine: Positive for heat intolerance.  Musculoskeletal: Positive for back pain and gait problem.       Objective:   Physical Exam GENERAL APPEARANCE: Alert, conversant. Appropriately groomed. No acute distress.  VASCULAR: Pedal pulses are  Faint  at  [facility] and PT bilateral.  Capillary refill time is immediate to all digits,  Cold feet noted. Purplish toes  B/L   NEUROLOGIC: sensation is normal to 5.07 monofilament at 5/5 sites bilateral.  Light touch is intact bilateral, Muscle strength normal.  MUSCULOSKELETAL: acceptable muscle strength, tone and stability bilateral.  Intrinsic muscluature intact bilateral.  Rectus appearance of foot and digits noted bilateral. HAV  B/L. Plantarflexed fifth metatarsals  B/L  DERMATOLOGIC: skin color, texture, and turgor are within normal limits.  No preulcerative lesions or ulcers  are seen, no interdigital maceration noted.  No open lesions present.  Digital nails are asymptomatic. No drainage noted.Plantar tyloma sub5th  metatarsal B/L.         Assessment & Plan:  Onychomycosis  Plantar tyloma  Diabetes with vascular disease  Diabetic neuropathy   IE  Debridement of nails both feet. Debridement of the plantar tyloma both feet. Told the patient that she is experiencing peripheral neuropathy pain and symptoms and therefore should be seen by her medical doctor for possible treatment with gabapentin patient to return to the office for continued treatment of the nails and callus in 3 months   Gardiner Barefoot DPM

## 2016-08-10 ENCOUNTER — Other Ambulatory Visit (HOSPITAL_COMMUNITY): Payer: Self-pay | Admitting: *Deleted

## 2016-08-10 DIAGNOSIS — I5022 Chronic systolic (congestive) heart failure: Secondary | ICD-10-CM

## 2016-08-10 MED ORDER — DILTIAZEM HCL ER COATED BEADS 180 MG PO CP24: 180.0000 mg | ORAL_CAPSULE | Freq: Every day | ORAL | 3 refills | Status: DC

## 2016-08-20 DIAGNOSIS — R0602 Shortness of breath: Secondary | ICD-10-CM | POA: Diagnosis not present

## 2016-08-27 DIAGNOSIS — I509 Heart failure, unspecified: Secondary | ICD-10-CM | POA: Diagnosis not present

## 2016-09-06 DIAGNOSIS — N184 Chronic kidney disease, stage 4 (severe): Secondary | ICD-10-CM | POA: Diagnosis not present

## 2016-09-06 DIAGNOSIS — E669 Obesity, unspecified: Secondary | ICD-10-CM | POA: Diagnosis not present

## 2016-09-20 DIAGNOSIS — R0602 Shortness of breath: Secondary | ICD-10-CM | POA: Diagnosis not present

## 2016-09-27 DIAGNOSIS — I509 Heart failure, unspecified: Secondary | ICD-10-CM | POA: Diagnosis not present

## 2016-10-06 ENCOUNTER — Ambulatory Visit
Admission: RE | Admit: 2016-10-06 | Discharge: 2016-10-06 | Disposition: A | Discharge status: Home / Self Care | Payer: PPO | Source: Ambulatory Visit | Attending: Internal Medicine | Admitting: Internal Medicine

## 2016-10-06 ENCOUNTER — Other Ambulatory Visit: Payer: Self-pay | Admitting: Internal Medicine

## 2016-10-06 DIAGNOSIS — Z1231 Encounter for screening mammogram for malignant neoplasm of breast: Secondary | ICD-10-CM

## 2016-10-08 ENCOUNTER — Other Ambulatory Visit (HOSPITAL_COMMUNITY): Payer: Self-pay | Admitting: Cardiology

## 2016-10-12 DIAGNOSIS — J019 Acute sinusitis, unspecified: Secondary | ICD-10-CM | POA: Diagnosis not present

## 2016-10-21 DIAGNOSIS — R0602 Shortness of breath: Secondary | ICD-10-CM | POA: Diagnosis not present

## 2016-10-27 DIAGNOSIS — G4733 Obstructive sleep apnea (adult) (pediatric): Secondary | ICD-10-CM | POA: Diagnosis not present

## 2016-10-28 DIAGNOSIS — I509 Heart failure, unspecified: Secondary | ICD-10-CM | POA: Diagnosis not present

## 2016-11-01 ENCOUNTER — Ambulatory Visit: Payer: PPO | Admitting: Podiatry

## 2016-11-02 ENCOUNTER — Ambulatory Visit: Payer: PPO | Admitting: Podiatry

## 2016-11-02 DIAGNOSIS — G4733 Obstructive sleep apnea (adult) (pediatric): Secondary | ICD-10-CM | POA: Diagnosis not present

## 2016-11-13 ENCOUNTER — Other Ambulatory Visit (HOSPITAL_COMMUNITY): Payer: Self-pay | Admitting: Cardiology

## 2016-11-14 ENCOUNTER — Other Ambulatory Visit (HOSPITAL_COMMUNITY): Payer: Self-pay | Admitting: Cardiology

## 2016-11-14 MED ORDER — AMIODARONE HCL 100 MG PO TABS
100.0000 mg | ORAL_TABLET | Freq: Every day | ORAL | 6 refills | Status: DC
Start: 2016-11-14 — End: 2017-03-21

## 2016-11-15 ENCOUNTER — Other Ambulatory Visit: Payer: PPO

## 2016-11-16 ENCOUNTER — Other Ambulatory Visit (HOSPITAL_COMMUNITY): Payer: Self-pay | Admitting: Internal Medicine

## 2016-11-16 ENCOUNTER — Other Ambulatory Visit: Payer: Self-pay | Admitting: Gastroenterology

## 2016-11-17 ENCOUNTER — Ambulatory Visit (INDEPENDENT_AMBULATORY_CARE_PROVIDER_SITE_OTHER)
Admission: RE | Admit: 2016-11-17 | Discharge: 2016-11-17 | Disposition: A | Discharge status: Home / Self Care | Payer: PPO | Source: Ambulatory Visit | Attending: Adult Health | Admitting: Adult Health

## 2016-11-17 ENCOUNTER — Telehealth: Payer: Self-pay | Admitting: Adult Health

## 2016-11-17 DIAGNOSIS — R911 Solitary pulmonary nodule: Secondary | ICD-10-CM

## 2016-11-17 DIAGNOSIS — R0602 Shortness of breath: Secondary | ICD-10-CM | POA: Diagnosis not present

## 2016-11-17 NOTE — Telephone Encounter (Signed)
Attempted to contact pt. No answer and I could not leave a message. We need to speak to the pt not Stacey in [place].

## 2016-11-17 NOTE — Telephone Encounter (Signed)
Spoke with pt. States that she is not feeling well. Reports increased SOB and coughing. Pt wanted to scheduled an appointment but we do not have any available appointments this afternoon. While speaking to pt, she sounds winded and fatigued. Advised pt that she sounded like she needed to be seen by someone. I recommended that the pt go to an urgent care or ED. She agreed and verbalized understanding. Nothing further was needed.

## 2016-11-18 DIAGNOSIS — R0602 Shortness of breath: Secondary | ICD-10-CM | POA: Diagnosis not present

## 2016-11-21 ENCOUNTER — Ambulatory Visit (INDEPENDENT_AMBULATORY_CARE_PROVIDER_SITE_OTHER): Payer: PPO | Admitting: Pulmonary Disease

## 2016-11-21 ENCOUNTER — Encounter: Payer: Self-pay | Admitting: Pulmonary Disease

## 2016-11-21 VITALS — BP 128/80 | HR 79 | Ht 64.0 in | Wt 250.0 lb

## 2016-11-21 DIAGNOSIS — G4733 Obstructive sleep apnea (adult) (pediatric): Secondary | ICD-10-CM | POA: Diagnosis not present

## 2016-11-21 NOTE — Patient Instructions (Signed)
Will get copy of CPAP download  Follow up in 6 months

## 2016-11-21 NOTE — Progress Notes (Signed)
Current Outpatient Prescriptions on File Prior to Visit  Medication Sig  . albuterol (PROVENTIL) (2.5 MG/3ML) 0.083% nebulizer solution Take 2.5 mg by nebulization every 6 (six) hours as needed for shortness of breath. Use four times a day as needed for shortness of breath or wheezing  . amiodarone (PACERONE) 100 MG tablet Take 1 tablet (100 mg total) by mouth daily.  Marland Kitchen amitriptyline (ELAVIL) 25 MG tablet Take 25 mg by mouth at bedtime.    Marland Kitchen atorvastatin (LIPITOR) 40 MG tablet TAKE ONE TABLET BY MOUTH ONCE DAILY  . dicyclomine (BENTYL) 10 MG capsule Take 10 mg by mouth daily.   Marland Kitchen diltiazem (CARDIZEM CD) 180 MG 24 hr capsule Take 1 capsule (180 mg total) by mouth daily.  Marland Kitchen ELIQUIS 5 MG TABS tablet TAKE ONE TABLET BY MOUTH TWICE DAILY  . esomeprazole (NEXIUM) 40 MG capsule TAKE ONE CAPSULE BY MOUTH TWICE DAILY BEFORE MEAL(S)  . hydrALAZINE (APRESOLINE) 25 MG tablet TAKE ONE TABLET BY MOUTH THREE TIMES DAILY  . insulin glargine (LANTUS) 100 UNIT/ML injection Inject 60 Units into the skin every morning.   . levalbuterol (XOPENEX) 0.31 MG/3ML nebulizer solution Take 1 ampule by nebulization every 4 (four) hours as needed for wheezing.  Marland Kitchen levothyroxine (SYNTHROID, LEVOTHROID) 25 MCG tablet Take 1 tablet by mouth daily.  Marland Kitchen LINZESS 290 MCG CAPS capsule   . metolazone (ZAROXOLYN) 2.5 MG tablet Take 1 tablet (2.5 mg total) by mouth 2 (two) times a week. Tuesday and Saturday  . metoprolol tartrate (LOPRESSOR) 25 MG tablet TAKE THREE TABLETS BY MOUTH TWICE DAILY  . nitroGLYCERIN (NITROSTAT) 0.4 MG SL tablet Place 0.4 mg under the tongue every 5 (five) minutes as needed for chest pain (x 3 doses). Reported on 02/25/2016  . NOVOLOG FLEXPEN 100 UNIT/ML FlexPen   . omeprazole (PRILOSEC) 40 MG capsule Take 1 capsule (40 mg total) by mouth 2 (two) times daily before a meal.  . OXYGEN Inhale 3 L/min into the lungs continuous. Patient uses nightly  . potassium chloride SA (K-DUR,KLOR-CON) 20 MEQ tablet Take 4  tablets (80 mEq total) by mouth 2 (two) times daily.  Marland Kitchen RELION PEN NEEDLES 32G X 4 MM MISC   . torsemide (DEMADEX) 100 MG tablet Take 100 mg by mouth 2 (two) times daily.  Marland Kitchen ULORIC 80 MG TABS Take 1 tablet by mouth daily.   . Vitamin D, Ergocalciferol, (DRISDOL) 50000 UNITS CAPS capsule Take 50,000 Units by mouth every Monday.  Penne Lash HFA 45 MCG/ACT inhaler Inhale 2 puffs into the lungs every 4 (four) hours as needed for wheezing or shortness of breath.  . zolpidem (AMBIEN) 10 MG tablet Take 10 mg by mouth at bedtime.    No current facility-administered medications on file prior to visit.      Chief Complaint  Patient presents with  . Follow-up    Discuss CT results. Pt states that she has been having increased SOB with little exertion and a cough that is worsening. Pt also notes some dizziness with the cough. Cough is dry. Some leg/feet swelling.     Pulmonary tests PFT 02/04/15 >> FEV1 1.88 (100%), FEV1% 83, TLC 3.79 (75%), DLCO 28%, +BD Serology 02/16/15 >> anti CCP negative, RF 23, anti Jo negative, Scl 70 negative, SSa/SSb negative, ANA negative, ESR 48 HR CT chest 03/26/15 >> patchy GGO with areas of btx, air trapping, 7 mm nodule in lingula PFT 11/19/15 >> FEV1 1.85 (100%), FEV1% 83, TLC 5.44 (107%), RV 152%, DLCO 30%, borderline BD CT  chest 11/17/16 >> 7 mm nodule lingula, mild b/l BTX, patchy GGO b/l  Sleep tests CPAP 10/22/16 to 11/20/16 >> used on 30 of 30 nights with average 4 hrs 38 min.  Average AHI 3 with CPAP 10 cm H2O  Cardiac tests Echo 03/06/14 >> mild LVH, EF 60 to 65% Echo 02/17/15 >> EF 60 to 07%, grade 2 diastolic dysfx, PAS 38 mmHg  GI tests Esophagram 04/24/14 >> severe esophageal dysmotility, esophageal stasis, small HH  Past medical history A flutter/fib, HTN, Diastolic CHF, HLD, CAD s/p stent, Fatty liver, GERD, CKD  Past surgical history, Family history, Social history, Allergies reviewed  Vital Signs BP 128/80 (BP Location: Left Arm, Cuff Size: Normal)    Pulse 79   Ht 5' 4"  (1.626 m)   Wt 250 lb (113.4 kg)   SpO2 94%   BMI 42.91 kg/m   History of Present Illness [name] is a 71 y.o. female former smoker dyspnea.  She had CT chest earlier this month.  No significant progression in Lt lung predominant disease.   She was seen by Dr. Florene Glen with renal.  Advised she was heading toward dialysis.  She has decided against dialysis.  She understands the implications of this decision if she progresses to stage 5 CKD.  She is followed by Dr. Florentina Addison with GI and had assessment of her esophagus and swallowing.  She still gets some episodes of reflux.  She still sleeps on her left side.  She uses oxygen and CPAP.  She is not sure when her CPAP was last checked.  She gets winded with activity.  She is not very active.  She does okay once she sits and rests.  She is not having much cough, wheeze, or sputum.   Physical Exam  General - No distress ENT - No sinus tenderness, no oral exudate, no LAN Cardiac - s1s2 regular, no murmur Chest - No wheeze/rales/dullness Back - No focal tenderness Abd - Soft, non-tender Ext - No edema Neuro - Normal strength Skin - No rashes Psych - normal mood, and behavior   Assessment/Plan  Dyspnea. - related to obesity, deconditioning, ILD, congestive heart failure  Interstitial lung disease. - likely related to reflux with recurrent silent aspiration - she has Lt side predominant disease, likely related to her preference for sleeping with left side down - monitor clinically - optimize management of reflux with GI  Chronic hypoxic respiratory failure. - continue supplemental oxygen  Obstructive sleep apnea. - continue CPAP 10 cm H2O  Lung nodule. - stable on recent CT chest  A fib/flutter. Diastolic CHF. - per cardiology  Goals of care. - she confirmed today her desire not to undergo dialysis if her renal disease progresses - also confirmed DNR/DNI status    Patient  Instructions  Will get copy of CPAP download  Follow up in 6 months   Time spent 32 minutes  Chesley Mires, MD [facility] Pager:  [phone] 11/21/2016, 5:27 PM

## 2016-11-21 NOTE — Progress Notes (Signed)
Called spoke with patient, advised of CT results / recs as stated by TP.  Pt verbalized her understanding and denied any questions. 

## 2016-11-23 ENCOUNTER — Telehealth (HOSPITAL_COMMUNITY): Payer: Self-pay | Admitting: *Deleted

## 2016-11-23 NOTE — Telephone Encounter (Signed)
Patient called in reporting that she has had increased swelling in her feet and legs for 2 weeks.  This morning she noticed she had a cut on her left leg that was having a lot of drainage.  Patient is unaware of when this occurred.  She also reports she is having increased shortness of breath today and feels very swollen.    I advised her to go to the emergency room and she agreed with me that she thought she needed to go as well but wanted to check with [place] first.  No further questions at this time.

## 2016-11-25 DIAGNOSIS — I509 Heart failure, unspecified: Secondary | ICD-10-CM | POA: Diagnosis not present

## 2016-11-28 ENCOUNTER — Ambulatory Visit: Payer: PPO | Admitting: Pulmonary Disease

## 2016-11-30 ENCOUNTER — Encounter: Payer: Self-pay | Admitting: Internal Medicine

## 2016-12-05 ENCOUNTER — Ambulatory Visit (INDEPENDENT_AMBULATORY_CARE_PROVIDER_SITE_OTHER): Payer: PPO | Admitting: *Deleted

## 2016-12-05 DIAGNOSIS — I495 Sick sinus syndrome: Secondary | ICD-10-CM

## 2016-12-06 ENCOUNTER — Encounter: Payer: Self-pay | Admitting: Cardiology

## 2016-12-06 LAB — CUP PACEART REMOTE DEVICE CHECK
Battery Remaining Longevity: 121 mo
Battery Remaining Percentage: 95.5 %
Battery Voltage: 2.99 V
Brady Statistic AP VP Percent: 1 %
Brady Statistic AP VS Percent: 79 %
Brady Statistic AS VP Percent: 1 %
Brady Statistic AS VS Percent: 20 %
Brady Statistic RA Percent Paced: 72 %
Brady Statistic RV Percent Paced: 5.2 %
Date Time Interrogation Session: 20180403060021
Implantable Lead Implant Date: 20151015
Implantable Lead Implant Date: 20151015
Implantable Lead Location: 753859
Implantable Lead Location: 753860
Implantable Lead Model: 1948
Implantable Pulse Generator Implant Date: 20151015
Lead Channel Impedance Value: 440 Ohm
Lead Channel Impedance Value: 540 Ohm
Lead Channel Pacing Threshold Amplitude: 0.75 V
Lead Channel Pacing Threshold Amplitude: 1.5 V
Lead Channel Pacing Threshold Pulse Width: 0.4 ms
Lead Channel Pacing Threshold Pulse Width: 0.5 ms
Lead Channel Sensing Intrinsic Amplitude: 12 mV
Lead Channel Sensing Intrinsic Amplitude: 5 mV
Lead Channel Setting Pacing Amplitude: 1.75 V
Lead Channel Setting Pacing Amplitude: 1.75 V
Lead Channel Setting Pacing Pulse Width: 0.5 ms
Lead Channel Setting Sensing Sensitivity: 2 mV
Pulse Gen Model: 2240
Pulse Gen Serial Number: 7665001

## 2016-12-06 NOTE — Progress Notes (Signed)
Remote pacemaker transmission.   

## 2016-12-10 ENCOUNTER — Other Ambulatory Visit (HOSPITAL_COMMUNITY): Payer: Self-pay | Admitting: Cardiology

## 2016-12-15 DIAGNOSIS — E1122 Type 2 diabetes mellitus with diabetic chronic kidney disease: Secondary | ICD-10-CM | POA: Diagnosis not present

## 2016-12-15 DIAGNOSIS — M109 Gout, unspecified: Secondary | ICD-10-CM | POA: Diagnosis not present

## 2016-12-15 DIAGNOSIS — N184 Chronic kidney disease, stage 4 (severe): Secondary | ICD-10-CM | POA: Diagnosis not present

## 2016-12-15 DIAGNOSIS — I129 Hypertensive chronic kidney disease with stage 1 through stage 4 chronic kidney disease, or unspecified chronic kidney disease: Secondary | ICD-10-CM | POA: Diagnosis not present

## 2016-12-15 DIAGNOSIS — N390 Urinary tract infection, site not specified: Secondary | ICD-10-CM | POA: Diagnosis not present

## 2016-12-15 DIAGNOSIS — M810 Age-related osteoporosis without current pathological fracture: Secondary | ICD-10-CM | POA: Diagnosis not present

## 2016-12-19 DIAGNOSIS — R0602 Shortness of breath: Secondary | ICD-10-CM | POA: Diagnosis not present

## 2016-12-20 ENCOUNTER — Encounter: Payer: Self-pay | Admitting: Cardiology

## 2016-12-26 DIAGNOSIS — I509 Heart failure, unspecified: Secondary | ICD-10-CM | POA: Diagnosis not present

## 2017-01-01 ENCOUNTER — Telehealth (HOSPITAL_COMMUNITY): Payer: Self-pay | Admitting: Vascular Surgery

## 2017-01-01 ENCOUNTER — Other Ambulatory Visit (HOSPITAL_COMMUNITY): Payer: Self-pay | Admitting: Cardiology

## 2017-01-01 NOTE — Telephone Encounter (Signed)
Pt need a refill Metoprolol, she made appt for 01/12/17

## 2017-01-04 NOTE — Telephone Encounter (Signed)
Addressed 01/03/17

## 2017-01-05 ENCOUNTER — Encounter: Payer: PPO | Admitting: Internal Medicine

## 2017-01-05 ENCOUNTER — Ambulatory Visit (INDEPENDENT_AMBULATORY_CARE_PROVIDER_SITE_OTHER): Payer: PPO | Admitting: Ophthalmology

## 2017-01-12 ENCOUNTER — Ambulatory Visit (INDEPENDENT_AMBULATORY_CARE_PROVIDER_SITE_OTHER): Payer: PPO | Admitting: Ophthalmology

## 2017-01-12 DIAGNOSIS — E669 Obesity, unspecified: Secondary | ICD-10-CM | POA: Diagnosis not present

## 2017-01-12 DIAGNOSIS — N185 Chronic kidney disease, stage 5: Secondary | ICD-10-CM | POA: Diagnosis not present

## 2017-01-18 DIAGNOSIS — R0602 Shortness of breath: Secondary | ICD-10-CM | POA: Diagnosis not present

## 2017-01-19 ENCOUNTER — Ambulatory Visit (HOSPITAL_COMMUNITY)
Admission: RE | Admit: 2017-01-19 | Discharge: 2017-01-19 | Disposition: A | Discharge status: Home / Self Care | Payer: PPO | Source: Ambulatory Visit | Attending: Cardiology | Admitting: Cardiology

## 2017-01-19 VITALS — BP 130/82 | HR 82 | Wt 249.5 lb

## 2017-01-19 DIAGNOSIS — E785 Hyperlipidemia, unspecified: Secondary | ICD-10-CM | POA: Diagnosis not present

## 2017-01-19 DIAGNOSIS — K219 Gastro-esophageal reflux disease without esophagitis: Secondary | ICD-10-CM | POA: Insufficient documentation

## 2017-01-19 DIAGNOSIS — I4891 Unspecified atrial fibrillation: Secondary | ICD-10-CM

## 2017-01-19 DIAGNOSIS — I5032 Chronic diastolic (congestive) heart failure: Secondary | ICD-10-CM | POA: Insufficient documentation

## 2017-01-19 DIAGNOSIS — E1151 Type 2 diabetes mellitus with diabetic peripheral angiopathy without gangrene: Secondary | ICD-10-CM | POA: Insufficient documentation

## 2017-01-19 DIAGNOSIS — G4733 Obstructive sleep apnea (adult) (pediatric): Secondary | ICD-10-CM | POA: Insufficient documentation

## 2017-01-19 DIAGNOSIS — Z9981 Dependence on supplemental oxygen: Secondary | ICD-10-CM | POA: Diagnosis not present

## 2017-01-19 DIAGNOSIS — I252 Old myocardial infarction: Secondary | ICD-10-CM | POA: Insufficient documentation

## 2017-01-19 DIAGNOSIS — I2510 Atherosclerotic heart disease of native coronary artery without angina pectoris: Secondary | ICD-10-CM | POA: Diagnosis not present

## 2017-01-19 DIAGNOSIS — I481 Persistent atrial fibrillation: Secondary | ICD-10-CM | POA: Diagnosis not present

## 2017-01-19 DIAGNOSIS — Z800 Family history of malignant neoplasm of digestive organs: Secondary | ICD-10-CM | POA: Insufficient documentation

## 2017-01-19 DIAGNOSIS — Z79899 Other long term (current) drug therapy: Secondary | ICD-10-CM | POA: Insufficient documentation

## 2017-01-19 DIAGNOSIS — Z794 Long term (current) use of insulin: Secondary | ICD-10-CM | POA: Diagnosis not present

## 2017-01-19 DIAGNOSIS — Z8249 Family history of ischemic heart disease and other diseases of the circulatory system: Secondary | ICD-10-CM | POA: Insufficient documentation

## 2017-01-19 DIAGNOSIS — Z8051 Family history of malignant neoplasm of kidney: Secondary | ICD-10-CM | POA: Insufficient documentation

## 2017-01-19 DIAGNOSIS — Z8041 Family history of malignant neoplasm of ovary: Secondary | ICD-10-CM | POA: Diagnosis not present

## 2017-01-19 DIAGNOSIS — R42 Dizziness and giddiness: Secondary | ICD-10-CM | POA: Diagnosis not present

## 2017-01-19 DIAGNOSIS — I739 Peripheral vascular disease, unspecified: Secondary | ICD-10-CM | POA: Insufficient documentation

## 2017-01-19 DIAGNOSIS — I132 Hypertensive heart and chronic kidney disease with heart failure and with stage 5 chronic kidney disease, or end stage renal disease: Secondary | ICD-10-CM | POA: Diagnosis present

## 2017-01-19 DIAGNOSIS — J849 Interstitial pulmonary disease, unspecified: Secondary | ICD-10-CM | POA: Insufficient documentation

## 2017-01-19 DIAGNOSIS — I495 Sick sinus syndrome: Secondary | ICD-10-CM | POA: Diagnosis not present

## 2017-01-19 DIAGNOSIS — M109 Gout, unspecified: Secondary | ICD-10-CM | POA: Diagnosis not present

## 2017-01-19 DIAGNOSIS — E1122 Type 2 diabetes mellitus with diabetic chronic kidney disease: Secondary | ICD-10-CM | POA: Diagnosis not present

## 2017-01-19 DIAGNOSIS — J45909 Unspecified asthma, uncomplicated: Secondary | ICD-10-CM | POA: Diagnosis not present

## 2017-01-19 DIAGNOSIS — Z9889 Other specified postprocedural states: Secondary | ICD-10-CM | POA: Insufficient documentation

## 2017-01-19 DIAGNOSIS — Z87891 Personal history of nicotine dependence: Secondary | ICD-10-CM | POA: Diagnosis not present

## 2017-01-19 DIAGNOSIS — K625 Hemorrhage of anus and rectum: Secondary | ICD-10-CM | POA: Insufficient documentation

## 2017-01-19 DIAGNOSIS — Z8052 Family history of malignant neoplasm of bladder: Secondary | ICD-10-CM | POA: Insufficient documentation

## 2017-01-19 DIAGNOSIS — N183 Chronic kidney disease, stage 3 (moderate): Secondary | ICD-10-CM

## 2017-01-19 LAB — CBC
HCT: 45.7 % (ref 36.0–46.0)
Hemoglobin: 14.6 g/dL (ref 12.0–15.0)
MCH: 32.2 pg (ref 26.0–34.0)
MCHC: 31.9 g/dL (ref 30.0–36.0)
MCV: 100.9 fL — ABNORMAL HIGH (ref 78.0–100.0)
Platelets: 189 10*3/uL (ref 150–400)
RBC: 4.53 MIL/uL (ref 3.87–5.11)
RDW: 14.7 % (ref 11.5–15.5)
WBC: 6.9 10*3/uL (ref 4.0–10.5)

## 2017-01-19 LAB — COMPREHENSIVE METABOLIC PANEL
ALT: 14 U/L (ref 14–54)
AST: 19 U/L (ref 15–41)
Albumin: 3.8 g/dL (ref 3.5–5.0)
Alkaline Phosphatase: 67 U/L (ref 38–126)
Anion gap: 13 (ref 5–15)
BUN: 44 mg/dL — ABNORMAL HIGH (ref 6–20)
CO2: 23 mmol/L (ref 22–32)
Calcium: 9.6 mg/dL (ref 8.9–10.3)
Chloride: 104 mmol/L (ref 101–111)
Creatinine, Ser: 4.07 mg/dL — ABNORMAL HIGH (ref 0.44–1.00)
GFR calc Af Amer: 12 mL/min — ABNORMAL LOW (ref >60)
GFR calc non Af Amer: 10 mL/min — ABNORMAL LOW (ref >60)
Glucose, Bld: 172 mg/dL — ABNORMAL HIGH (ref 65–99)
Potassium: 4.5 mmol/L (ref 3.5–5.1)
Sodium: 140 mmol/L (ref 135–145)
Total Bilirubin: 0.6 mg/dL (ref 0.3–1.2)
Total Protein: 6.8 g/dL (ref 6.5–8.1)

## 2017-01-19 LAB — TSH: TSH: 3.192 u[IU]/mL (ref 0.350–4.500)

## 2017-01-19 MED ORDER — METOPROLOL TARTRATE 25 MG PO TABS: 75.0000 mg | ORAL_TABLET | Freq: Two times a day (BID) | ORAL | 6 refills | Status: DC

## 2017-01-19 NOTE — Patient Instructions (Signed)
Will schedule you for ABI vascular study at [facility] office. Address: [address], [place],  [address]  Phone: [phone]  Refills sent for Lopressor and Metolazone.  Routine lab work today. Will notify you of abnormal results, otherwise no news is good news!  Follow up with Dr. Aundra Dubin in 3 months. We will call you closer to this time, or you may call our office to schedule 1 month before you are due to be seen.  Do the following things EVERYDAY: 1) Weigh yourself in the morning before breakfast. Write it down and keep it in a log. 2) Take your medicines as prescribed 3) Eat low salt foods-Limit salt (sodium) to 2000 mg per day.  4) Stay as active as you can everyday 5) Limit all fluids for the day to less than 2 liters

## 2017-01-19 NOTE — Progress Notes (Signed)
Advanced Heart Failure Clinic Note   Patient ID: [name], female   DOB: 1945-11-14, 71 y.o.   MRN: [id number]   PCP: Dr. Alyson Ingles  Nephrologist: Dr. Florene Glen Cardiology: Dr Aundra Dubin   HPI: [name] is a 71 y.o. female with hypertension, hyperlipidemia, bronchial asthma, diabetes mellitus, CKD stage IV (baseline cr ~2.5), atrial fibrillation and diastolic HF.  She was admitted in 4/15 for R TKR. That hospitalization complicated by a/c diastolic HF with respiratory distress requiring non-rebreather support. Weight on discharge was 226 pounds. ABG at that time 7.4/34/91/97%. Cr peaked at 2.9  Admitted 6/30-7/10/15 for SOB and CP following scheduled cardioversion. Found to have severe aspiration PNA and was intubated. She maintained SR for short period of time and then went back into Afib/Aflutter.  HR controlled on amio and diltiazem. Discharged to [facility] at a weight of 232 lbs.   Admitted 06/16/14 with symptomatic tachy/brady syndrome. 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 Discharge weight was 223 pounds.   Admitted 02/16/15 for symptomatic anemia s/p blood work from her pulmonologist.  Was diuresed with lasix while in hospital.   Last echo in 6/16 with EF 60-65%, PASP 37 mmHg.   PFTs (6/16) with FVC 80%, FEV1 83%, ratio 103%, TLC 75%, DLCO 78% => restriction c/w interstitial process.  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   Holter (7/16) with rare PACs, PVCs; no atrial fibrillation.   After her CT showing interstitial fibrosis, she saw pulmonary. There was concern for connective tissue disease with lung involvement.  She has seen rheumatology and was told that she likely has rheumatoid arthritis.  The pulmonologist did not think that her ILD was due to amiodarone, so she is still on amiodarone.    She presents today for regular follow up. Last seen in clinic 04/2016. Has been  following up with Dr. Halford Chessman and Dr. Florene Glen last week. Stage V kidney disease but she does not plan to get dialysis.  On chronic 02 3 L via [place]. OK getting around the house if she takes her times, but on a bad day gets fatigued walking to the bathroom.  Remains very fatigued. Occasional lightheadedness with rapid standing. Did have a "room spinning" sensation at one point but was transient and hasn't recurred. Wearing CPAP every night for most of the night. Had pain in her legs with walking and occasional blisters.  Did have one episode of BRBPR earlier this week. No further.   Labs: 03/13/14: K+ 3.4, creatinine 2.10, BUN 16 04/02/14: K 5.6, creatinine 2.66, BUN 29 04/13/14: K 4.0, creatinine 2.66, K 5.6 06/20/14 K 3.7 creatinine 2.35  07/21/14 K 3.6 creatinine 2.55 2/16 K 3.8, creatinine 3.03, LFTs normal, TSH normal, hgb 12.8  5/16 K 3.5, creatinine 3.37, LFTs normal, TSH mildly elevated, T3 just below normal, T4 normal. BNP 147 6/16 K 3.9, creatinine 2.98, BNP 364.2, SCL-70 negative, RF elevated but CCP negative, ANA negative, ESR 48.  7/16 K 3.4, creatinine 3.31 LFTs normal, TSH and T4 normal, T3 just below normal, HCT 42.4 11/16: K 3.5, creatinine 3.4, HCT 45.1 2/17: K 3, creatinine 3.22, LFTs normal, TSH normal, HCT 46.9 7/17: K 3.6, creatinine 3.6, HCT 43.3, LFTs normal, TSH normal  ROS: All systems negative except as listed in HPI, PMH and Problem List.  SH:  Social History   Social History  . Marital status: Widowed    Spouse name: N/A  . Number of children:  3  . Years of education: N/A   Occupational History  . Disabled    Social History Main Topics  . Smoking status: Former Smoker    Packs/day: 0.50    Years: 35.00    Types: Cigarettes    Quit date: 02/16/1995  . Smokeless tobacco: Never Used     Comment: 05/2014  QUIT OVER 20 YEARS AGO "  . Alcohol use No  . Drug use: No  . Sexual activity: Not Currently   Other Topics Concern  . Not on file   Social History Narrative   . No narrative on file    FH:  Family History  Problem Relation Age of Onset  . Heart disease Mother   . Bladder Cancer Mother   . Kidney disease Mother   . Ovarian cancer Daughter   . Stomach cancer Maternal Uncle   . Colon cancer Maternal Aunt        dx in her 54's  . Esophageal cancer Neg Hx     Past Medical History:  Diagnosis Date  . Anginal pain ([place])    occ; non-ischemic Lexiscan 09/2012  . Arthritis   . Asthma   . Atrial flutter ([place])    ablated by Dr Lovena Le in 2008  . CKD (chronic kidney disease) 04/2007   CKD stage 4(Dr. Erling Cruz)  . Complication of anesthesia     DIFFICULTY BREATHING   . Coronary atherosclerosis of native coronary artery   . Diastolic heart failure 0/9628   grade 2 diastolic dysfunction per 11/6627 echo  . DNI (do not intubate)   . DNR (do not resuscitate)   . Esophageal dysmotility 2008   noted on esophagram.  hx dysphagia.   . Fatty liver 2008   noted on ultrasound 2008  . Fatty tumor fatty tumor back  . GERD (gastroesophageal reflux disease) 2012   Barrets esophagus on bx 2012 and 2014.   Marland Kitchen Gouty arthropathy   . Heart murmur   . Hyperlipidemia   . Hypertension   . IDDM (insulin dependent diabetes mellitus) ([place])    type 2.   . Morbid obesity ([employer])   . Myocardial infarction 2009  . Peripheral vascular disease ([place])   . Persistent atrial fibrillation ([facility])    chads2 vasc score of at least 5  . Sick sinus syndrome ([place])   . Sleep apnea    wears CPAP   Current Outpatient Prescriptions  Medication Sig Dispense Refill  . albuterol (PROVENTIL) (2.5 MG/3ML) 0.083% nebulizer solution Take 2.5 mg by nebulization every 6 (six) hours as needed for shortness of breath. Use four times a day as needed for shortness of breath or wheezing    . amiodarone (PACERONE) 100 MG tablet Take 1 tablet (100 mg total) by mouth daily. 30 tablet 6  . amitriptyline (ELAVIL) 25 MG tablet Take 25 mg by mouth at bedtime.      Marland Kitchen atorvastatin (LIPITOR) 40 MG  tablet TAKE ONE TABLET BY MOUTH ONCE DAILY 90 tablet 3  . dicyclomine (BENTYL) 10 MG capsule Take 10 mg by mouth daily.     Marland Kitchen diltiazem (CARDIZEM CD) 180 MG 24 hr capsule Take 1 capsule (180 mg total) by mouth daily. 30 capsule 3  . ELIQUIS 5 MG TABS tablet TAKE ONE TABLET BY MOUTH TWICE DAILY 60 tablet 6  . esomeprazole (NEXIUM) 40 MG capsule TAKE ONE CAPSULE BY MOUTH TWICE DAILY BEFORE MEAL(S) 60 capsule 3  . hydrALAZINE (APRESOLINE) 25 MG tablet TAKE ONE TABLET BY  MOUTH THREE TIMES DAILY 90 tablet 3  . insulin glargine (LANTUS) 100 UNIT/ML injection Inject 60 Units into the skin every morning.     . levalbuterol (XOPENEX) 0.31 MG/3ML nebulizer solution Take 1 ampule by nebulization every 4 (four) hours as needed for wheezing.    Marland Kitchen levothyroxine (SYNTHROID, LEVOTHROID) 25 MCG tablet Take 1 tablet by mouth daily.    Marland Kitchen LINZESS 290 MCG CAPS capsule     . metolazone (ZAROXOLYN) 2.5 MG tablet Take 1 tablet (2.5 mg total) by mouth 2 (two) times a week. Tuesday and Saturday 10 tablet 6  . metoprolol tartrate (LOPRESSOR) 25 MG tablet TAKE THREE TABLETS BY MOUTH TWICE DAILY 180 tablet 0  . nitroGLYCERIN (NITROSTAT) 0.4 MG SL tablet Place 0.4 mg under the tongue every 5 (five) minutes as needed for chest pain (x 3 doses). Reported on 02/25/2016    . NOVOLOG FLEXPEN 100 UNIT/ML FlexPen     . omeprazole (PRILOSEC) 40 MG capsule Take 1 capsule (40 mg total) by mouth 2 (two) times daily before a meal. 120 capsule 3  . OXYGEN Inhale 3 L/min into the lungs continuous. Patient uses nightly    . potassium chloride SA (K-DUR,KLOR-CON) 20 MEQ tablet Take 4 tablets (80 mEq total) by mouth 2 (two) times daily. 720 tablet 3  . ranitidine (ZANTAC) 150 MG tablet Take 150 mg by mouth as needed for heartburn.    Marland Kitchen RELION PEN NEEDLES 32G X 4 MM MISC     . torsemide (DEMADEX) 100 MG tablet Take 100 mg by mouth 2 (two) times daily.    Marland Kitchen ULORIC 80 MG TABS Take 1 tablet by mouth daily.     . Vitamin D, Ergocalciferol,  (DRISDOL) 50000 UNITS CAPS capsule Take 50,000 Units by mouth every Monday.    Penne Lash HFA 45 MCG/ACT inhaler Inhale 2 puffs into the lungs every 4 (four) hours as needed for wheezing or shortness of breath. 1 Inhaler 1  . zolpidem (AMBIEN) 10 MG tablet Take 10 mg by mouth at bedtime.      No current facility-administered medications for this encounter.     Vitals:   01/19/17 1114  BP: 130/82  Pulse: 82  SpO2: 95%  Weight: 249 lb 8 oz (113.2 kg)   Wt Readings from Last 3 Encounters:  01/19/17 249 lb 8 oz (113.2 kg)  11/21/16 250 lb (113.4 kg)  08/09/16 256 lb (116.1 kg)     PHYSICAL EXAM: General:Obese. In [place]. NAD. Daughter present.  HEENT: normal Neck: supple. JVP difficult to assess due to body habitus. Does not appear elevated. 6. Carotids 2+ bilat; no bruits. No thyromegaly or nodule noted. Cor: PMI non-palpable. RRR, No M/G/R noted Lungs: Diminished but clear Abdomen: soft, non-tender, distended, no HSM. No bruits or masses. +BS  Extremities: no cyanosis, clubbing, or rash. Trace edema bilaterally.  Neuro: alert & orientedx3, cranial nerves grossly intact. moves all 4 extremities w/o difficulty. Affect pleasant   ASSESSMENT & PLAN:  1) Chronic diastolic HF: EF 16-10% (9/60 echo). NYHA class IIIb symptoms, stable.  Weight is down with increased metolazone dosing.  Dyspnea likely has a prominent pulmonary component with restrictive PFTs from body habitus and interstitial lung disease (NSIP pattern by high resolution CT).  - NYHA III at least but difficult to assess fully with comorbidities and limited mobility - Continue torsemide 100 mg BID and Metolazone Tues/Saturday.  - Reinforced fluid restriction to < 2 L daily, sodium restriction to less than 2000 mg daily, and  the importance of daily weights.   2) Afib/Aflutter:   - NSR on exam on amiodarone. Following pulmonary because of interstitial lung disease, this is not thought to be due to amiodarone.   - CMET and TSH  today. She will need regular eye exams on amiodarone.  - Had BRBPR earlier this week on Eliquis (h/o cecal AVM s/p ablation). ? Hemorrhoidal, but will check CBC today. Will need PCP follow up if bleeding.  3) CKD stage IV-V: CMET today. Saw renal last week. Does not want dialysis.  4) Tachy/Brady syndrome: St Jude PPM.  Stable.  5) OSA: Continue nightly CPAP.  6) Interstitial lung disease: Has ILD, may be RA-related.  Has seen Dr Amil Amen and follows with Dr Halford Chessman for pulmonology.  She is on home oxygen.  No change 7) BRPBR - One episode earlier this week. CBC today.  8) PVD - Claudications symptoms and faint pulses. Will order ABIs. (States she had these in 2015 with arterial disease - It is likely even if problem found she may not be eligible for any correction with her kidney disease.   ABIs. No room to change meds with CKD V. RTC 3 months. Sooner with symptoms.   Satira Mccallum [facility] 01/19/2017   Greater than 50% of the 25 minute visit was spent in counseling/coordination of care regarding disease state education including peripheral vascular disease, heart failure, and end stage renal disease. Discussion importance of salt/fluid restriction and medical compliance.

## 2017-01-20 LAB — T3, FREE: T3, Free: 2.3 pg/mL (ref 2.0–4.4)

## 2017-01-24 ENCOUNTER — Other Ambulatory Visit (HOSPITAL_COMMUNITY): Payer: Self-pay | Admitting: Cardiology

## 2017-01-25 DIAGNOSIS — I509 Heart failure, unspecified: Secondary | ICD-10-CM | POA: Diagnosis not present

## 2017-01-26 ENCOUNTER — Ambulatory Visit (INDEPENDENT_AMBULATORY_CARE_PROVIDER_SITE_OTHER): Payer: PPO | Admitting: Physician Assistant

## 2017-01-26 VITALS — BP 126/80 | HR 70 | Ht 64.0 in | Wt 244.0 lb

## 2017-01-26 DIAGNOSIS — Z950 Presence of cardiac pacemaker: Secondary | ICD-10-CM

## 2017-01-26 DIAGNOSIS — I5032 Chronic diastolic (congestive) heart failure: Secondary | ICD-10-CM

## 2017-01-26 DIAGNOSIS — I2510 Atherosclerotic heart disease of native coronary artery without angina pectoris: Secondary | ICD-10-CM | POA: Diagnosis not present

## 2017-01-26 DIAGNOSIS — I4819 Other persistent atrial fibrillation: Secondary | ICD-10-CM

## 2017-01-26 DIAGNOSIS — I481 Persistent atrial fibrillation: Secondary | ICD-10-CM

## 2017-01-26 NOTE — Patient Instructions (Addendum)
Medication Instructions:    Your physician recommends that you continue on your current medications as directed. Please refer to the Current Medication list given to you today.   If you need a refill on your cardiac medications before your next appointment, please call your pharmacy.  Labwork: NONE ORDERED  TODAY    Testing/Procedures: NONE ORDERED  TODAY    Follow-Up: Your physician wants you to follow-up in: [place] will receive a reminder letter in the mail two months in advance. If you don't receive a letter, please call our office to schedule the follow-up appointment.  Remote monitoring is used to monitor your Pacemaker of ICD from home. This monitoring reduces the number of office visits required to check your device to one time per year. It allows [place] to keep an eye on the functioning of your device to ensure it is working properly. You are scheduled for a device check from home on . 04-30-17 You may send your transmission at any time that day. If you have a wireless device, the transmission will be sent automatically. After your physician reviews your transmission, you will receive a postcard with your next transmission date.     Any Other Special Instructions Will Be Listed Below (If Applicable).

## 2017-01-26 NOTE — Progress Notes (Addendum)
Cardiology Office Note Date:  01/26/2017  Patient ID:  [name], [name] September 27, 1945, MRN [id number] PCP:  [name], MD  Cardiologist:  Dr. Aundra Dubin Electrophysiologist; Dr. Rayann Heman    Chief Complaint: annual device visit  History of Present Illness: [name] is a 71 y.o. female with history of AFib, (AFlutter ablated by Dr. Lovena Le 2008), tachy/brady w/PPM, CAD w/PCI in 2012, HTN, HLD, asthma/interstitial lung disease on O2, DM, CRI (IV) follows with Dr. Florene Glen, no plans for HD, and diastolic chronic CHF.  She comes in today to be seen for Dr. Rayann Heman, last seen by EP service, A. Lynnell Jude, NP in march last year  She comes in today for an annual EP visit, is accompanied by her daughter.  She reports feeling poorly in general, no energy, she is frustrated about her overall health.  She tells me she does not intend on having dialysis, but frustrated about the difficulty keeping "the water off" .  She denies near syncope or syncope, she has significant peripheral neuropathy, poor exercise capacity, no rest SOB, she says she can no longer tell what is bothering her since she has so many problems.  She denies CP.  It is hard to tell, but sounds generally these are chronic symptoms, but becoming more frustrating to her, tells me she just always feels poorly.  She denies any kind of bleeding or signs of bleeding.   Device information:  STJ dual chamber PPM implanted 2015 for SSS  Past Medical History:  Diagnosis Date  . Anginal pain ([place])    occ; non-ischemic Lexiscan 09/2012  . Arthritis   . Asthma   . Atrial flutter ([place])    ablated by Dr Lovena Le in 2008  . CKD (chronic kidney disease) 04/2007   CKD stage 4(Dr. Erling Cruz)  . Complication of anesthesia     DIFFICULTY BREATHING   . Coronary atherosclerosis of native coronary artery   . Diastolic heart failure 03/1244   grade 2 diastolic dysfunction per 04/997 echo  . DNI (do not intubate)   . DNR (do not resuscitate)   .  Esophageal dysmotility 2008   noted on esophagram.  hx dysphagia.   . Fatty liver 2008   noted on ultrasound 2008  . Fatty tumor fatty tumor back  . GERD (gastroesophageal reflux disease) 2012   Barrets esophagus on bx 2012 and 2014.   Marland Kitchen Gouty arthropathy   . Heart murmur   . Hyperlipidemia   . Hypertension   . IDDM (insulin dependent diabetes mellitus) ([place])    type 2.   . Morbid obesity ([place])   . Myocardial infarction 2009  . Peripheral vascular disease ([place])   . Persistent atrial fibrillation ([facility])    chads2 vasc score of at least 5  . Sick sinus syndrome ([place])   . Sleep apnea    wears CPAP    Past Surgical History:  Procedure Laterality Date  . ATRIAL FLUTTER ABLATION  2008   CTI ablation by Dr Lovena Le  . BREAST LUMPECTOMY     right  . CARDIOVERSION N/A 03/03/2014   Procedure: CARDIOVERSION;  Surgeon: Laverda Page, MD;  Location: [facility];  Service: Cardiovascular;  Laterality: N/A;  . COLONOSCOPY WITH PROPOFOL N/A 02/18/2015   Procedure: COLONOSCOPY WITH PROPOFOL;  Surgeon: Milus Banister, MD;  Location: [facility];  Service: Endoscopy;  Laterality: N/A;  . CORONARY ANGIOPLASTY WITH STENT PLACEMENT    . PACEMAKER INSERTION  06/18/14   STJ Assurity dual chamber pacemaker  implanted by Dr Rayann Heman  . PERMANENT PACEMAKER INSERTION N/A 06/18/2014   SJM Assurity DR pacemaker implanted by Dr Rayann Heman for sick sinus syndrome  . RIGHT HEART CATHETERIZATION N/A 06/12/2014   Procedure: RIGHT HEART CATH;  Surgeon: Jolaine Artist, MD;  Location: [facility] CATH LAB;  Service: Cardiovascular;  Laterality: N/A;  . TONSILLECTOMY    . TOTAL KNEE ARTHROPLASTY Right 12/15/2013   Procedure: RIGHT TOTAL KNEE ARTHROPLASTY;  Surgeon: Alta Corning, MD;  Location: [facility];  Service: Orthopedics;  Laterality: Right;  . TUBAL LIGATION    . VIDEO BRONCHOSCOPY Bilateral 12/23/2015   Procedure: VIDEO BRONCHOSCOPY WITH FLUORO;  Surgeon: Chesley Mires, MD;  Location: [facility];  Service:  Cardiopulmonary;  Laterality: Bilateral;    Current Outpatient Prescriptions  Medication Sig Dispense Refill  . albuterol (PROVENTIL) (2.5 MG/3ML) 0.083% nebulizer solution Take 2.5 mg by nebulization every 6 (six) hours as needed for shortness of breath. Use four times a day as needed for shortness of breath or wheezing    . amiodarone (PACERONE) 100 MG tablet Take 1 tablet (100 mg total) by mouth daily. 30 tablet 6  . amitriptyline (ELAVIL) 25 MG tablet Take 25 mg by mouth at bedtime.      Marland Kitchen atorvastatin (LIPITOR) 40 MG tablet TAKE ONE TABLET BY MOUTH ONCE DAILY 90 tablet 3  . dicyclomine (BENTYL) 10 MG capsule Take 10 mg by mouth daily.     Marland Kitchen diltiazem (CARDIZEM CD) 180 MG 24 hr capsule Take 1 capsule (180 mg total) by mouth daily. 30 capsule 3  . ELIQUIS 5 MG TABS tablet TAKE ONE TABLET BY MOUTH TWICE DAILY 60 tablet 6  . esomeprazole (NEXIUM) 40 MG capsule TAKE ONE CAPSULE BY MOUTH TWICE DAILY BEFORE MEAL(S) 60 capsule 3  . hydrALAZINE (APRESOLINE) 25 MG tablet TAKE ONE TABLET BY MOUTH THREE TIMES DAILY 90 tablet 3  . insulin glargine (LANTUS) 100 UNIT/ML injection Inject 60 Units into the skin every morning.     . levalbuterol (XOPENEX) 0.31 MG/3ML nebulizer solution Take 1 ampule by nebulization every 4 (four) hours as needed for wheezing.    Marland Kitchen levothyroxine (SYNTHROID, LEVOTHROID) 25 MCG tablet Take 1 tablet by mouth daily.    Marland Kitchen LINZESS 290 MCG CAPS capsule     . metolazone (ZAROXOLYN) 2.5 MG tablet Take 1 tablet (2.5 mg total) by mouth 2 (two) times a week. Tuesday and Saturday 10 tablet 6  . metoprolol tartrate (LOPRESSOR) 25 MG tablet Take 3 tablets (75 mg total) by mouth 2 (two) times daily. 180 tablet 6  . nitroGLYCERIN (NITROSTAT) 0.4 MG SL tablet Place 0.4 mg under the tongue every 5 (five) minutes as needed for chest pain (x 3 doses). Reported on 02/25/2016    . NOVOLOG FLEXPEN 100 UNIT/ML FlexPen     . omeprazole (PRILOSEC) 40 MG capsule Take 1 capsule (40 mg total) by mouth 2  (two) times daily before a meal. 120 capsule 3  . OXYGEN Inhale 3 L/min into the lungs continuous. Patient uses nightly    . potassium chloride SA (K-DUR,KLOR-CON) 20 MEQ tablet Take 4 tablets (80 mEq total) by mouth 2 (two) times daily. 720 tablet 3  . ranitidine (ZANTAC) 150 MG tablet Take 150 mg by mouth as needed for heartburn.    Marland Kitchen RELION PEN NEEDLES 32G X 4 MM MISC     . torsemide (DEMADEX) 100 MG tablet Take 100 mg by mouth 2 (two) times daily.    Marland Kitchen ULORIC 80 MG TABS Take  1 tablet by mouth daily.     . Vitamin D, Ergocalciferol, (DRISDOL) 50000 UNITS CAPS capsule Take 50,000 Units by mouth every Monday.    Penne Lash HFA 45 MCG/ACT inhaler Inhale 2 puffs into the lungs every 4 (four) hours as needed for wheezing or shortness of breath. 1 Inhaler 1  . zolpidem (AMBIEN) 10 MG tablet Take 10 mg by mouth at bedtime.      No current facility-administered medications for this visit.     Allergies:   Adhesive [tape]; Sulfa antibiotics; Codeine; and Penicillins   Social History:  The patient  reports that she quit smoking about 21 years ago. Her smoking use included Cigarettes. She has a 17.50 pack-year smoking history. She has never used smokeless tobacco. She reports that she does not drink alcohol or use drugs.   Family History:  The patient's family history includes Bladder Cancer in her mother; Colon cancer in her maternal aunt; Heart disease in her mother; Kidney disease in her mother; Ovarian cancer in her daughter; Stomach cancer in her maternal uncle.  ROS:  Please see the history of present illness.  All other systems are reviewed and otherwise negative.   PHYSICAL EXAM:  VS:  BP 126/80   Pulse 70   Ht 5\' 4"  (1.626 m)   Wt 244 lb (110.7 kg)   BMI 41.88 kg/m  BMI: Body mass index is 41.88 kg/m. Well nourished, well developed, obese, in no acute distress  HEENT: normocephalic, atraumatic  Neck: no JVD, carotid bruits or masses Cardiac:  IRRR, 1/6 SM, no rubs, or  gallops Lungs: CTA b/l, no wheezing, rhonchi or rales  Abd: soft, nontender MS: no deformity or atrophy Ext: trace edema  Skin: warm and dry, no rash Neuro:  No gross deficits appreciated Psych: euthymic mood, full affect  PPM site is stable, no tethering or discomfort   EKG:  Done today is reviewed by myself w/Dr. Caryl Comes, is AF, some paced/fused beats, T changes likely post pacing PPM interrogation done today and reviewed by myself: battery and lead measurements, she appears to have gone into AF February and has been since then, RV threshold looks unchanged.  01/03/11: LHC/PCI CONCLUSION:  1. Severe 2-vessel coronary artery disease with total occlusion of the      right coronary dependent on collaterals from the LAD and      circumflex.  There is also high-grade obstruction with at least 90%      proximal stenosis in the circumflex.  There is moderate disease in      the second obtuse marginal branch.  The LAD is widely patent with      the exception of systolic compression of the midvessel and 80% in      the apical LAD.  A small ramus intermedius branch contains 90%      ostial stenosis.  2. Normal LV function.  3. Successful stenting of the circumflex with a Promus drug-eluting      stent to 3.25-mm diameter with TIMI grade 3 flow noted.  02/17/15: TTE Study Conclusions - Left ventricle: The cavity size was normal. There was mild   concentric hypertrophy. Systolic function was normal. The   estimated ejection fraction was in the range of 60% to 65%. Wall   motion was normal; there were no regional wall motion   abnormalities. Doppler parameters are consistent with   pseudonormal left ventricular relaxation (grade 2 diastolic   dysfunction). The E/A ratio is >1.5 and the E/e&' ratio is >  15,   suggesting elevated LV filling pressure. - Aortic valve: Trileaflet. Sclerosis without stenosis. - Mitral valve: There was trivial regurgitation. - Right atrium: The atrium was normal in  size. - Pulmonary arteries: PA peak pressure: 38 mm Hg (S). - Inferior vena cava: The vessel was dilated. The respirophasic   diameter changes were blunted (< 50%), consistent with elevated   central venous pressure. Impressions: - Compared to the prior study in 03/2014, there is now grade 2   diastolic dysfunction wtih elevated LV filling pressure, elevated   RA pressure and mild pulmonary hypertension (RVSP 38 mmHg). LVEF   remains 60-65%.  Recent Labs: 01/19/2017: ALT 14; BUN 44; Creatinine, Ser 4.07; Hemoglobin 14.6; Platelets 189; Potassium 4.5; Sodium 140; TSH 3.192  No results found for requested labs within last 8760 hours.   Estimated Creatinine Clearance: 15.7 mL/min (A) (by C-G formula based on SCr of 4.07 mg/dL (H)).   Wt Readings from Last 3 Encounters:  01/26/17 244 lb (110.7 kg)  01/19/17 249 lb 8 oz (113.2 kg)  11/21/16 250 lb (113.4 kg)     Other studies reviewed: Additional studies/records reviewed today include: summarized above  ASSESSMENT AND PLAN:  1. PPM     Stable device function  2. HTN, hypertensive heart disease, chronic diastolic CHF     BP looks good today     She will c/w Dr. Aundra Dubin, nephrology     Her weight is actually the lowest it has been of late   3. Paroxysmal AFib >> persistent     CHA2DS2Vasc is at least 4, On Eliquis      She has been in AF it looks like since feb.     She reports compliance with all of her medicines, and in particular her Eliquis without missed doses in the last 4 weeks, (if ever she said)  Eliquis given her renal function may no longer be the right a/c choice for her.  Her last Creat and last clearance was 26 by available labs in Epic, last week 22.  Her H/H is OK, she denies any bleeding or signs of bleeding.    4. CAD     No anginal sounding complaints   I suggested DCCV, she is on low dose amiodarone, given her lung disease though not felt to be the amio, hesitate to up-titrate, she is  reluctant to do DCCV,  she attributes her respiratory failure/intubation as 2/2 DCCV back in 2015.  She would like Dr.McLean involved and would be agreeable if he thought it may help.  I have staff messaged him to get his input and told the patient that if he thought it was a good idea and she was agreeable that either his office or ours would f/u with her to arrange. I will also discuss her a/c with Dr. Aundra Dubin ? Coumadin or xarelto, ?  ADDEND: I dicsussed with Dr. Aundra Dubin, agrees worth a try to get DCCV, and likely change a/c after the 4 weeks post DCCV. I have staff message his RN to look for a day he would be available to do it w/anesthesia and reach out to the patient to schedule.       Disposition: F/u with remote pacer check in 3 months, Dr. Rayann Heman in 1 year, sooner if needed.  Current medicines are reviewed at length with the patient today.  The patient did not have any concerns regarding medicines.  Haywood Lasso, PA-C 01/26/2017 4:15 PM     [place] [address] [place] [address] [phone] (office)  [phone] (fax)

## 2017-01-30 ENCOUNTER — Other Ambulatory Visit (HOSPITAL_COMMUNITY): Payer: Self-pay | Admitting: Student

## 2017-01-30 DIAGNOSIS — I739 Peripheral vascular disease, unspecified: Secondary | ICD-10-CM

## 2017-01-31 ENCOUNTER — Other Ambulatory Visit (HOSPITAL_COMMUNITY): Payer: Self-pay | Admitting: Cardiology

## 2017-01-31 MED ORDER — METOLAZONE 2.5 MG PO TABS: 2.5000 mg | ORAL_TABLET | ORAL | 6 refills | Status: DC

## 2017-02-01 ENCOUNTER — Telehealth (HOSPITAL_COMMUNITY): Payer: Self-pay

## 2017-02-01 NOTE — Telephone Encounter (Signed)
Spoke with Tommye Standard PA-C with EP regarding mutual patient.  Advised to set up DCCV with Dr. Aundra Dubin. Patient very reluctant to have a DCCV as she states she coded after her last one. Would like to see Dr. Aundra Dubin in clinic for exam and discussion before scheduling DCCV. Added on to his schedule next week to discuss.  Renee Pain, RN

## 2017-02-05 ENCOUNTER — Ambulatory Visit (HOSPITAL_COMMUNITY)
Admission: RE | Admit: 2017-02-05 | Discharge: 2017-02-05 | Disposition: A | Discharge status: Home / Self Care | Payer: PPO | Source: Ambulatory Visit | Attending: Cardiology | Admitting: Cardiology

## 2017-02-05 ENCOUNTER — Encounter (HOSPITAL_COMMUNITY): Payer: Self-pay

## 2017-02-05 VITALS — BP 145/77 | HR 71 | Wt 248.5 lb

## 2017-02-05 DIAGNOSIS — G4733 Obstructive sleep apnea (adult) (pediatric): Secondary | ICD-10-CM | POA: Insufficient documentation

## 2017-02-05 DIAGNOSIS — E1122 Type 2 diabetes mellitus with diabetic chronic kidney disease: Secondary | ICD-10-CM | POA: Diagnosis not present

## 2017-02-05 DIAGNOSIS — I4819 Other persistent atrial fibrillation: Secondary | ICD-10-CM

## 2017-02-05 DIAGNOSIS — E1151 Type 2 diabetes mellitus with diabetic peripheral angiopathy without gangrene: Secondary | ICD-10-CM | POA: Insufficient documentation

## 2017-02-05 DIAGNOSIS — I130 Hypertensive heart and chronic kidney disease with heart failure and stage 1 through stage 4 chronic kidney disease, or unspecified chronic kidney disease: Secondary | ICD-10-CM | POA: Insufficient documentation

## 2017-02-05 DIAGNOSIS — J45909 Unspecified asthma, uncomplicated: Secondary | ICD-10-CM | POA: Insufficient documentation

## 2017-02-05 DIAGNOSIS — I2510 Atherosclerotic heart disease of native coronary artery without angina pectoris: Secondary | ICD-10-CM | POA: Diagnosis not present

## 2017-02-05 DIAGNOSIS — Z794 Long term (current) use of insulin: Secondary | ICD-10-CM | POA: Insufficient documentation

## 2017-02-05 DIAGNOSIS — I5032 Chronic diastolic (congestive) heart failure: Secondary | ICD-10-CM | POA: Insufficient documentation

## 2017-02-05 DIAGNOSIS — R9431 Abnormal electrocardiogram [ECG] [EKG]: Secondary | ICD-10-CM | POA: Insufficient documentation

## 2017-02-05 DIAGNOSIS — Z66 Do not resuscitate: Secondary | ICD-10-CM | POA: Insufficient documentation

## 2017-02-05 DIAGNOSIS — I495 Sick sinus syndrome: Secondary | ICD-10-CM | POA: Diagnosis not present

## 2017-02-05 DIAGNOSIS — Z87891 Personal history of nicotine dependence: Secondary | ICD-10-CM | POA: Insufficient documentation

## 2017-02-05 DIAGNOSIS — I481 Persistent atrial fibrillation: Secondary | ICD-10-CM | POA: Insufficient documentation

## 2017-02-05 DIAGNOSIS — N184 Chronic kidney disease, stage 4 (severe): Secondary | ICD-10-CM | POA: Insufficient documentation

## 2017-02-05 DIAGNOSIS — Z9981 Dependence on supplemental oxygen: Secondary | ICD-10-CM | POA: Insufficient documentation

## 2017-02-05 DIAGNOSIS — Z79899 Other long term (current) drug therapy: Secondary | ICD-10-CM | POA: Diagnosis not present

## 2017-02-05 DIAGNOSIS — J849 Interstitial pulmonary disease, unspecified: Secondary | ICD-10-CM | POA: Insufficient documentation

## 2017-02-05 DIAGNOSIS — E785 Hyperlipidemia, unspecified: Secondary | ICD-10-CM | POA: Diagnosis not present

## 2017-02-05 DIAGNOSIS — Z950 Presence of cardiac pacemaker: Secondary | ICD-10-CM | POA: Insufficient documentation

## 2017-02-05 DIAGNOSIS — I252 Old myocardial infarction: Secondary | ICD-10-CM | POA: Insufficient documentation

## 2017-02-05 DIAGNOSIS — M109 Gout, unspecified: Secondary | ICD-10-CM | POA: Diagnosis not present

## 2017-02-05 DIAGNOSIS — Z7901 Long term (current) use of anticoagulants: Secondary | ICD-10-CM | POA: Insufficient documentation

## 2017-02-05 DIAGNOSIS — K219 Gastro-esophageal reflux disease without esophagitis: Secondary | ICD-10-CM | POA: Diagnosis not present

## 2017-02-05 NOTE — Patient Instructions (Signed)
Your physician recommends that you schedule a follow-up appointment in: 1 month with echocardiogram

## 2017-02-06 NOTE — Progress Notes (Signed)
Patient ID: [name], female   DOB: Apr 09, 1946, 71 y.o.   MRN: [id number] PCP: Debra. Alyson [name]  Nephrologist: Debra. Florene [name] Cardiology: Debra [name]  HPI: [name] is a 71 year-old woman with hypertension, hyperlipidemia, bronchial asthma, diabetes mellitus, CKD stage IV (baseline cr ~2.5), CAD, atrial fibrillation and diastolic HF.  Cath 5/12 with Promus DES to proximal LCx.  RCA was totally occluded with collaterals.   She was admitted in 4/15 for R TKR. That hospitalization complicated by a/c diastolic HF with respiratory distress requiring non-rebreather support. Weight on discharge was 226 pounds. ABG at that time 7.4/34/91/97%. Cr peaked at 2.9  Admitted 6/30-7/10/15 for SOB and CP following scheduled cardioversion. Found to have severe aspiration PNA and was intubated. She maintained SR for short period of time and then went back into Afib/Aflutter.  HR controlled on amio and diltiazem. Discharged to [name] at a weight of 232 lbs.   Admitted 06/16/14 with symptomatic tachy/brady syndrome. 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 Discharge weight was 223 pounds.   Admitted 02/16/15 for symptomatic anemia s/p blood work from her pulmonologist.  Was diuresed with lasix while in hospital.   Last echo in 6/16 with EF 60-65%, PASP 37 mmHg.   PFTs (6/16) with FVC 80%, FEV1 83%, ratio 103%, TLC 75%, DLCO 78% => restriction c/w interstitial process.  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   Holter (7/16) with rare PACs, PVCs; no atrial fibrillation.   After her CT showing interstitial fibrosis, she saw pulmonary. There was concern for connective tissue disease with lung involvement.  She has seen rheumatology and was told that she likely has rheumatoid arthritis.  The pulmonologist did not think that her ILD was due to amiodarone, so she is still on amiodarone.    Continues to wear home oxygen and  uses CPAP at night.    She has been in atrial fibrillation persistently since 2/18.  She is in atrial fibrillation today.  She has stable dyspnea: generally very short of breath walking up stairs or an incline.  She is generally very limited.  No chest pain.  No lightheadedness.  No orthopnea/PND.  No BRBPR/melena. She cannot tell if she is in atrial fibrillation.  Dyspnea has not really changed since 2/18 when the atrial fibrillation started.  Creatinine has gradually worsened.  She does not want HD.   ECG (personally reviewed) 6/18: atrial fibrillation with v-pacing.    Labs: 03/13/14: K+ 3.4, creatinine 2.10, BUN 16 04/02/14: K 5.6, creatinine 2.66, BUN 29 04/13/14: K 4.0, creatinine 2.66, K 5.6 06/20/14 K 3.7 creatinine 2.35  07/21/14 K 3.6 creatinine 2.55 2/16 K 3.8, creatinine 3.03, LFTs normal, TSH normal, hgb 12.8  5/16 K 3.5, creatinine 3.37, LFTs normal, TSH mildly elevated, T3 just below normal, T4 normal. BNP 147 6/16 K 3.9, creatinine 2.98, BNP 364.2, SCL-70 negative, RF elevated but CCP negative, ANA negative, ESR 48.  7/16 K 3.4, creatinine 3.31 LFTs normal, TSH and T4 normal, T3 just below normal, HCT 42.4 11/16: K 3.5, creatinine 3.4, HCT 45.1 2/17: K 3, creatinine 3.22, LFTs normal, TSH normal, HCT 46.9 7/17: K 3.6, creatinine 3.6, HCT 43.3, LFTs normal, TSH normal 5/18: K 4.5, creatinine 4.07, TSH normal, LFTs normal, hgb 14.6  ROS: All systems negative except as listed in HPI, PMH and Problem List.  SH:  Social History   Social History  . Marital status: Widowed    Spouse name:  N/A  . Number of children: 3  . Years of education: N/A   Occupational History  . Disabled    Social History Main Topics  . Smoking status: Former Smoker    Packs/day: 0.50    Years: 35.00    Types: Cigarettes    Quit date: 02/16/1995  . Smokeless tobacco: Never Used     Comment: 05/2014  QUIT OVER 20 YEARS AGO "  . Alcohol use No  . Drug use: No  . Sexual activity: Not Currently    Other Topics Concern  . Not on file   Social History Narrative  . No narrative on file    FH:  Family History  Problem Relation Age of Onset  . Heart disease Mother   . Bladder Cancer Mother   . Kidney disease Mother   . Ovarian cancer Daughter   . Stomach cancer Maternal Uncle   . Colon cancer Maternal Aunt        dx in her 47's  . Esophageal cancer Neg Hx     Past Medical History:  Diagnosis Date  . Anginal pain ([name])    occ; non-ischemic Lexiscan 09/2012  . Arthritis   . Asthma   . Atrial flutter ([name])    ablated by [name] in 2008  . CKD (chronic kidney disease) 04/2007   CKD stage 4(Debra. Erling [name])  . Complication of anesthesia     DIFFICULTY BREATHING   . Coronary atherosclerosis of native coronary artery   . Diastolic heart failure 0/1655   grade 2 diastolic dysfunction per 11/7480 echo  . DNI (do not intubate)   . DNR (do not resuscitate)   . Esophageal dysmotility 2008   noted on esophagram.  hx dysphagia.   . Fatty liver 2008   noted on ultrasound 2008  . Fatty tumor fatty tumor back  . GERD (gastroesophageal reflux disease) 2012   Barrets esophagus on bx 2012 and 2014.   Marland Kitchen Gouty arthropathy   . Heart murmur   . Hyperlipidemia   . Hypertension   . 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   . Morbid obesity ([name])   . Myocardial infarction ([name]) 2009  . Peripheral vascular disease ([name])   . Persistent atrial fibrillation ([name])    chads2 vasc score of at least 5  . Sick sinus syndrome ([name])   . Sleep apnea    wears CPAP   Current Outpatient Prescriptions  Medication Sig Dispense Refill  . albuterol (PROVENTIL) (2.5 MG/3ML) 0.083% nebulizer solution Take 2.5 mg by nebulization every 6 (six) hours as needed for shortness of breath. Use four times a day as needed for shortness of breath or wheezing    . amiodarone (PACERONE) 100 MG tablet Take 1 tablet (100 mg total) by mouth daily. 30 tablet 6  . amitriptyline (ELAVIL) 25 MG  tablet Take 25 mg by mouth at bedtime.      Marland Kitchen atorvastatin (LIPITOR) 40 MG tablet TAKE ONE TABLET BY MOUTH ONCE DAILY 90 tablet 3  . dicyclomine (BENTYL) 10 MG capsule Take 10 mg by mouth daily.     Marland Kitchen diltiazem (CARDIZEM CD) 180 MG 24 hr capsule Take 1 capsule (180 mg total) by mouth daily. 30 capsule 3  . ELIQUIS 5 MG TABS tablet TAKE ONE TABLET BY MOUTH TWICE DAILY 60 tablet 6  . esomeprazole (NEXIUM) 40 MG capsule TAKE ONE CAPSULE BY MOUTH TWICE DAILY BEFORE MEAL(S) 60 capsule 3  . hydrALAZINE (APRESOLINE)  25 MG tablet TAKE ONE TABLET BY MOUTH THREE TIMES DAILY 90 tablet 3  . insulin glargine (LANTUS) 100 UNIT/ML injection Inject 60 Units into the skin every morning.     . levalbuterol (XOPENEX) 0.31 MG/3ML nebulizer solution Take 1 ampule by nebulization every 4 (four) hours as needed for wheezing.    Marland Kitchen levothyroxine (SYNTHROID, LEVOTHROID) 25 MCG tablet Take 1 tablet by mouth daily.    Marland Kitchen LINZESS 290 MCG CAPS capsule     . metoprolol tartrate (LOPRESSOR) 25 MG tablet Take 3 tablets (75 mg total) by mouth 2 (two) times daily. 180 tablet 6  . NOVOLOG FLEXPEN 100 UNIT/ML FlexPen     . OXYGEN Inhale 3 L/min into the lungs continuous. Patient uses nightly    . potassium chloride SA (K-DUR,KLOR-CON) 20 MEQ tablet Take 4 tablets (80 mEq total) by mouth 2 (two) times daily. 720 tablet 3  . ranitidine (ZANTAC) 150 MG tablet Take 150 mg by mouth as needed for heartburn.    Marland Kitchen RELION PEN NEEDLES 32G X 4 MM MISC     . torsemide (DEMADEX) 100 MG tablet Take 100 mg by mouth 2 (two) times daily.    Marland Kitchen ULORIC 80 MG TABS Take 1 tablet by mouth daily.     . Vitamin D, Ergocalciferol, (DRISDOL) 50000 UNITS CAPS capsule Take 50,000 Units by mouth every Monday.    Penne Lash HFA 45 MCG/ACT inhaler Inhale 2 puffs into the lungs every 4 (four) hours as needed for wheezing or shortness of breath. 1 Inhaler 1  . zolpidem (AMBIEN) 10 MG tablet Take 10 mg by mouth at bedtime.     . metolazone (ZAROXOLYN) 2.5 MG tablet  Take 1 tablet (2.5 mg total) by mouth 2 (two) times a week. Tuesday and Saturday (Patient not taking: Reported on 02/05/2017) 10 tablet 6  . nitroGLYCERIN (NITROSTAT) 0.4 MG SL tablet Place 0.4 mg under the tongue every 5 (five) minutes as needed for chest pain (x 3 doses). Reported on 02/25/2016    . omeprazole (PRILOSEC) 40 MG capsule Take 1 capsule (40 mg total) by mouth 2 (two) times daily before a meal. 120 capsule 3   No current facility-administered medications for this encounter.     Vitals:   02/05/17 1537  BP: (!) 145/77  Pulse: 71  SpO2: 99%  Weight: 248 lb 8 oz (112.7 kg)    PHYSICAL EXAM: General: Obese, NAD, in wheelchair. Daughter present HEENT: normal  Neck: supple. JVP 8 cm; Carotids 2+ bilat; no bruits. No lymphadenopathy or thryomegaly  Cor: PMI nonpalpable. Distant. Irregular rate. No murmur appreciated Lungs: Mild bibasilar crackles.   Abdomen: Obese, soft, nontender. mild-distended. No hepatosplenomegaly. No bruits or masses. +BS Extremities: no cyanosis, clubbing, rash, warm. 1+ ankle edema.   Neuro: alert & orientedx3, cranial nerves grossly intact. moves all 4 extremities w/o difficulty. Affect pleasant   ASSESSMENT & PLAN:  1) Chronic diastolic HF: EF 83-29% (1/91 echo). NYHA class IIIb symptoms, stable.  Dyspnea likely has a prominent pulmonary component with restrictive PFTs from body habitus and interstitial lung disease (NSIP pattern by high resolution CT).  She is on torsemide 100 mg bid with metolazone twice a week.  Probably mild volume overload today.  - Continue torsemide 100 mg bid.  - Continue metolazone twice a week.   - I will arrange for an echo to reassess LV/RV function.     2) Atrial fibrillation:  She remains on amiodarone.  I had her see pulmonary because of  interstitial lung disease, this is not thought to be due to amiodarone.  She has been in atrial fibrillation since 2/18.  She is not sure how much her symptoms have worsened with atrial  fibrillation as she is very limited at baseline.  I told her today that there is a chance that her symptoms will improve with DCCV back to NSR.  She is very reticent to have a DCCV, as the day after her last DCCV she was admitted with dyspnea and found to have PNA/CHF.  - She is going to think about DCCV.  She will call if she wants to do it.   - Continue amiodarone for now. Recent TSH and LFTs normal.  If she decides not to have DCCV, I will stop amiodarone.  - No bleeding problems on Eliquis (h/o cecal AVM s/p ablation).  Continue Eliquis for now.  If she decides against DCCV, I will go ahead and switch her over to coumadin (probably safer with her degree of renal dysfunction).  3) CKD stage IV: Most recent creatinine around 4. Sees nephrology.  She is very reluctant to undergo HD.  4) Tachy/Brady syndrome: St Jude PPM.   5) OSA: Use CPAP. 6) Interstitial lung disease: Has ILD, may be RA-related.  Has seen Debra Amil Amen and follows with Debra Halford Chessman for pulmonology.  She is on home oxygen.    Followup in 1 month.     [name] 02/06/2017

## 2017-02-09 ENCOUNTER — Ambulatory Visit (HOSPITAL_COMMUNITY)
Admission: RE | Admit: 2017-02-09 | Discharge: 2017-02-09 | Disposition: A | Discharge status: Home / Self Care | Payer: PPO | Source: Ambulatory Visit | Attending: Cardiology | Admitting: Cardiology

## 2017-02-09 ENCOUNTER — Ambulatory Visit (INDEPENDENT_AMBULATORY_CARE_PROVIDER_SITE_OTHER): Payer: PPO | Admitting: Ophthalmology

## 2017-02-09 DIAGNOSIS — R938 Abnormal findings on diagnostic imaging of other specified body structures: Secondary | ICD-10-CM | POA: Diagnosis not present

## 2017-02-09 DIAGNOSIS — E1151 Type 2 diabetes mellitus with diabetic peripheral angiopathy without gangrene: Secondary | ICD-10-CM | POA: Insufficient documentation

## 2017-02-09 DIAGNOSIS — I70203 Unspecified atherosclerosis of native arteries of extremities, bilateral legs: Secondary | ICD-10-CM | POA: Diagnosis not present

## 2017-02-09 DIAGNOSIS — Z87891 Personal history of nicotine dependence: Secondary | ICD-10-CM | POA: Insufficient documentation

## 2017-02-09 DIAGNOSIS — I2510 Atherosclerotic heart disease of native coronary artery without angina pectoris: Secondary | ICD-10-CM | POA: Insufficient documentation

## 2017-02-09 DIAGNOSIS — I10 Essential (primary) hypertension: Secondary | ICD-10-CM | POA: Diagnosis not present

## 2017-02-09 DIAGNOSIS — E785 Hyperlipidemia, unspecified: Secondary | ICD-10-CM | POA: Insufficient documentation

## 2017-02-09 DIAGNOSIS — I739 Peripheral vascular disease, unspecified: Secondary | ICD-10-CM

## 2017-02-13 ENCOUNTER — Other Ambulatory Visit (HOSPITAL_COMMUNITY): Payer: Self-pay

## 2017-02-13 DIAGNOSIS — I739 Peripheral vascular disease, unspecified: Secondary | ICD-10-CM

## 2017-02-13 NOTE — Progress Notes (Signed)
Referral sent to Dr. Fletcher Anon for PVD with claudication per Oda Kilts PA-C.

## 2017-02-15 DIAGNOSIS — I10 Essential (primary) hypertension: Secondary | ICD-10-CM | POA: Diagnosis not present

## 2017-02-15 DIAGNOSIS — E1165 Type 2 diabetes mellitus with hyperglycemia: Secondary | ICD-10-CM | POA: Diagnosis not present

## 2017-02-15 DIAGNOSIS — Z0000 Encounter for general adult medical examination without abnormal findings: Secondary | ICD-10-CM | POA: Diagnosis not present

## 2017-02-15 DIAGNOSIS — E785 Hyperlipidemia, unspecified: Secondary | ICD-10-CM | POA: Diagnosis not present

## 2017-02-15 DIAGNOSIS — G4700 Insomnia, unspecified: Secondary | ICD-10-CM | POA: Diagnosis not present

## 2017-02-18 DIAGNOSIS — R0602 Shortness of breath: Secondary | ICD-10-CM | POA: Diagnosis not present

## 2017-02-22 ENCOUNTER — Ambulatory Visit: Payer: PPO | Admitting: Family Medicine

## 2017-02-23 DIAGNOSIS — I5032 Chronic diastolic (congestive) heart failure: Secondary | ICD-10-CM | POA: Diagnosis not present

## 2017-02-23 DIAGNOSIS — Z0000 Encounter for general adult medical examination without abnormal findings: Secondary | ICD-10-CM | POA: Diagnosis not present

## 2017-02-23 DIAGNOSIS — E1022 Type 1 diabetes mellitus with diabetic chronic kidney disease: Secondary | ICD-10-CM | POA: Diagnosis not present

## 2017-02-25 DIAGNOSIS — I509 Heart failure, unspecified: Secondary | ICD-10-CM | POA: Diagnosis not present

## 2017-03-08 ENCOUNTER — Ambulatory Visit (INDEPENDENT_AMBULATORY_CARE_PROVIDER_SITE_OTHER): Payer: PPO | Admitting: *Deleted

## 2017-03-08 DIAGNOSIS — I495 Sick sinus syndrome: Secondary | ICD-10-CM | POA: Diagnosis not present

## 2017-03-08 NOTE — Progress Notes (Signed)
Remote pacemaker transmission.   

## 2017-03-16 ENCOUNTER — Encounter: Payer: Self-pay | Admitting: Cardiology

## 2017-03-19 ENCOUNTER — Other Ambulatory Visit: Payer: Self-pay | Admitting: Gastroenterology

## 2017-03-19 ENCOUNTER — Other Ambulatory Visit (HOSPITAL_COMMUNITY): Payer: Self-pay | Admitting: Internal Medicine

## 2017-03-19 ENCOUNTER — Other Ambulatory Visit (HOSPITAL_COMMUNITY): Payer: Self-pay | Admitting: Cardiology

## 2017-03-19 DIAGNOSIS — I5022 Chronic systolic (congestive) heart failure: Secondary | ICD-10-CM

## 2017-03-20 DIAGNOSIS — R0602 Shortness of breath: Secondary | ICD-10-CM | POA: Diagnosis not present

## 2017-03-21 ENCOUNTER — Ambulatory Visit (HOSPITAL_BASED_OUTPATIENT_CLINIC_OR_DEPARTMENT_OTHER)
Admission: RE | Admit: 2017-03-21 | Discharge: 2017-03-21 | Disposition: A | Discharge status: Home / Self Care | Payer: PPO | Source: Ambulatory Visit | Attending: Cardiology | Admitting: Cardiology

## 2017-03-21 ENCOUNTER — Ambulatory Visit (HOSPITAL_COMMUNITY)
Admission: RE | Admit: 2017-03-21 | Discharge: 2017-03-21 | Disposition: A | Discharge status: Home / Self Care | Payer: PPO | Source: Ambulatory Visit | Attending: Internal Medicine | Admitting: Internal Medicine

## 2017-03-21 ENCOUNTER — Encounter (HOSPITAL_COMMUNITY): Payer: Self-pay | Admitting: Cardiology

## 2017-03-21 ENCOUNTER — Encounter (HOSPITAL_COMMUNITY): Payer: Self-pay | Admitting: *Deleted

## 2017-03-21 VITALS — BP 141/80 | HR 72 | Wt 252.8 lb

## 2017-03-21 DIAGNOSIS — J849 Interstitial pulmonary disease, unspecified: Secondary | ICD-10-CM | POA: Diagnosis not present

## 2017-03-21 DIAGNOSIS — I4819 Other persistent atrial fibrillation: Secondary | ICD-10-CM

## 2017-03-21 DIAGNOSIS — G4733 Obstructive sleep apnea (adult) (pediatric): Secondary | ICD-10-CM | POA: Diagnosis not present

## 2017-03-21 DIAGNOSIS — E784 Other hyperlipidemia: Secondary | ICD-10-CM | POA: Diagnosis not present

## 2017-03-21 DIAGNOSIS — I495 Sick sinus syndrome: Secondary | ICD-10-CM | POA: Diagnosis not present

## 2017-03-21 DIAGNOSIS — N184 Chronic kidney disease, stage 4 (severe): Secondary | ICD-10-CM | POA: Insufficient documentation

## 2017-03-21 DIAGNOSIS — E1151 Type 2 diabetes mellitus with diabetic peripheral angiopathy without gangrene: Secondary | ICD-10-CM | POA: Diagnosis not present

## 2017-03-21 DIAGNOSIS — Z66 Do not resuscitate: Secondary | ICD-10-CM | POA: Diagnosis not present

## 2017-03-21 DIAGNOSIS — I5032 Chronic diastolic (congestive) heart failure: Secondary | ICD-10-CM | POA: Diagnosis not present

## 2017-03-21 DIAGNOSIS — I130 Hypertensive heart and chronic kidney disease with heart failure and stage 1 through stage 4 chronic kidney disease, or unspecified chronic kidney disease: Secondary | ICD-10-CM | POA: Diagnosis present

## 2017-03-21 DIAGNOSIS — Z79899 Other long term (current) drug therapy: Secondary | ICD-10-CM | POA: Diagnosis not present

## 2017-03-21 DIAGNOSIS — I481 Persistent atrial fibrillation: Secondary | ICD-10-CM

## 2017-03-21 DIAGNOSIS — Z7901 Long term (current) use of anticoagulants: Secondary | ICD-10-CM | POA: Diagnosis not present

## 2017-03-21 DIAGNOSIS — E1122 Type 2 diabetes mellitus with diabetic chronic kidney disease: Secondary | ICD-10-CM | POA: Diagnosis not present

## 2017-03-21 DIAGNOSIS — E7849 Other hyperlipidemia: Secondary | ICD-10-CM

## 2017-03-21 DIAGNOSIS — Z794 Long term (current) use of insulin: Secondary | ICD-10-CM | POA: Diagnosis not present

## 2017-03-21 DIAGNOSIS — Z87891 Personal history of nicotine dependence: Secondary | ICD-10-CM | POA: Insufficient documentation

## 2017-03-21 DIAGNOSIS — K219 Gastro-esophageal reflux disease without esophagitis: Secondary | ICD-10-CM | POA: Insufficient documentation

## 2017-03-21 DIAGNOSIS — I2510 Atherosclerotic heart disease of native coronary artery without angina pectoris: Secondary | ICD-10-CM | POA: Insufficient documentation

## 2017-03-21 DIAGNOSIS — E785 Hyperlipidemia, unspecified: Secondary | ICD-10-CM | POA: Insufficient documentation

## 2017-03-21 DIAGNOSIS — Z9981 Dependence on supplemental oxygen: Secondary | ICD-10-CM | POA: Diagnosis not present

## 2017-03-21 DIAGNOSIS — J45909 Unspecified asthma, uncomplicated: Secondary | ICD-10-CM | POA: Diagnosis not present

## 2017-03-21 DIAGNOSIS — I252 Old myocardial infarction: Secondary | ICD-10-CM | POA: Insufficient documentation

## 2017-03-21 LAB — TSH: TSH: 3.112 u[IU]/mL (ref 0.350–4.500)

## 2017-03-21 LAB — COMPREHENSIVE METABOLIC PANEL
ALT: 12 U/L — ABNORMAL LOW (ref 14–54)
AST: 20 U/L (ref 15–41)
Albumin: 4 g/dL (ref 3.5–5.0)
Alkaline Phosphatase: 63 U/L (ref 38–126)
Anion gap: 13 (ref 5–15)
BUN: 56 mg/dL — ABNORMAL HIGH (ref 6–20)
CO2: 23 mmol/L (ref 22–32)
Calcium: 9.6 mg/dL (ref 8.9–10.3)
Chloride: 104 mmol/L (ref 101–111)
Creatinine, Ser: 4.27 mg/dL — ABNORMAL HIGH (ref 0.44–1.00)
GFR calc Af Amer: 11 mL/min — ABNORMAL LOW (ref >60)
GFR calc non Af Amer: 10 mL/min — ABNORMAL LOW (ref >60)
Glucose, Bld: 127 mg/dL — ABNORMAL HIGH (ref 65–99)
Potassium: 4.2 mmol/L (ref 3.5–5.1)
Sodium: 140 mmol/L (ref 135–145)
Total Bilirubin: 0.9 mg/dL (ref 0.3–1.2)
Total Protein: 7.1 g/dL (ref 6.5–8.1)

## 2017-03-21 LAB — LIPID PANEL
Cholesterol: 165 mg/dL (ref 0–200)
HDL: 54 mg/dL (ref >40)
LDL Cholesterol: 52 mg/dL (ref 0–99)
Total CHOL/HDL Ratio: 3.1 RATIO
Triglycerides: 293 mg/dL — ABNORMAL HIGH (ref <150)
VLDL: 59 mg/dL — ABNORMAL HIGH (ref 0–40)

## 2017-03-21 MED ORDER — AMIODARONE HCL 100 MG PO TABS
200.0000 mg | ORAL_TABLET | Freq: Every day | ORAL | 6 refills | Status: DC
Start: 2017-03-21 — End: 2017-04-02

## 2017-03-21 NOTE — Progress Notes (Signed)
Patient ID: [name], female   DOB: 1945-10-19, 71 y.o.   MRN: [id number] PCP: Dr. Alyson Ingles  Nephrologist: Dr. Florene Glen Cardiology: Dr Aundra Dubin  HPI: [name] is a 71 year-old woman with hypertension, hyperlipidemia, bronchial asthma, diabetes mellitus, CKD stage IV (baseline cr ~2.5), CAD, atrial fibrillation and diastolic HF.  Cath 5/12 with Promus DES to proximal LCx.  RCA was totally occluded with collaterals.   She was admitted in 71/15 for R TKR. That hospitalization complicated by a/c diastolic HF with respiratory distress requiring non-rebreather support. Weight on discharge was 226 pounds. ABG at that time 7.4/34/91/97%. Cr peaked at 2.9  Admitted 6/30-7/10/15 for SOB and CP following scheduled cardioversion. Found to have severe aspiration PNA and was intubated. She maintained SR for short period of time and then went back into Afib/Aflutter.  HR controlled on amio and diltiazem. Discharged to [facility] at a weight of 232 lbs.   Admitted 06/16/14 with symptomatic tachy/brady syndrome. 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 Discharge weight was 223 pounds.   Admitted 02/16/15 for symptomatic anemia s/p blood work from her pulmonologist.  Was diuresed with lasix while in hospital.   PFTs (6/16) with FVC 80%, FEV1 83%, ratio 103%, TLC 75%, DLCO 78% => restriction c/w interstitial process.  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   Holter (7/16) with rare PACs, PVCs; no atrial fibrillation.  After her CT showing interstitial fibrosis, she saw pulmonary. There was concern for connective tissue disease with lung involvement.  She has seen rheumatology and was told that she likely has rheumatoid arthritis.  The pulmonologist did not think that her ILD was due to amiodarone, so she is still on amiodarone.    Peripheral arterial dopplers in 6/18 showed 50-74% left mid SFA stenosis and 30-49% right  mid SFA stenosis.    Echo was done today and reviewed: EF 55-60% with moderate LVH, normal RV size and systolic function, mild MR.    Continues to wear home oxygen and uses CPAP at night.    She has been in atrial fibrillation persistently since 2/18.  She is in atrial fibrillation today (by exam and by echo). She is very limited.  Short of breath walking around her house chronically.  Very hard to walk up stairs.  No orthopnea/PND.  No chest pain.  She feels numbness in her feet and pain in her thighs bilaterally "all the time."   Creatinine has gradually worsened.  She does not want HD.   Labs: 03/13/14: K+ 3.4, creatinine 2.10, BUN 16 04/02/14: K 5.6, creatinine 2.66, BUN 29 04/13/14: K 4.0, creatinine 2.66, K 5.6 06/20/14 K 3.7 creatinine 2.35  07/21/14 K 3.6 creatinine 2.55 2/16 K 3.8, creatinine 3.03, LFTs normal, TSH normal, hgb 12.8  5/16 K 3.5, creatinine 3.37, LFTs normal, TSH mildly elevated, T3 just below normal, T4 normal. BNP 147 6/16 K 3.9, creatinine 2.98, BNP 364.2, SCL-70 negative, RF elevated but CCP negative, ANA negative, ESR 48.  7/16 K 3.4, creatinine 3.31 LFTs normal, TSH and T4 normal, T3 just below normal, HCT 42.4 11/16: K 3.5, creatinine 3.4, HCT 45.1 2/17: K 3, creatinine 3.22, LFTs normal, TSH normal, HCT 46.9 7/17: K 3.6, creatinine 3.6, HCT 43.3, LFTs normal, TSH normal 5/18: K 4.5, creatinine 4.07, TSH normal, LFTs normal, hgb 14.6  ROS: All systems negative except as listed in HPI, PMH and Problem List.  SH:  Social History   Social History  .  Marital status: Widowed    Spouse name: N/A  . Number of children: 3  . Years of education: N/A   Occupational History  . Disabled    Social History Main Topics  . Smoking status: Former Smoker    Packs/day: 0.50    Years: 35.00    Types: Cigarettes    Quit date: 02/16/1995  . Smokeless tobacco: Never Used     Comment: 05/2014  QUIT OVER 20 YEARS AGO "  . Alcohol use No  . Drug use: No  . Sexual  activity: Not Currently   Other Topics Concern  . Not on file   Social History Narrative  . No narrative on file    FH:  Family History  Problem Relation Age of Onset  . Heart disease Mother   . Bladder Cancer Mother   . Kidney disease Mother   . Ovarian cancer Daughter   . Stomach cancer Maternal Uncle   . Colon cancer Maternal Aunt        dx in her 46's  . Esophageal cancer Neg Hx     Past Medical History:  Diagnosis Date  . Anginal pain ([place])    occ; non-ischemic Lexiscan 09/2012  . Arthritis   . Asthma   . Atrial flutter ([place])    ablated by Dr Lovena Le in 2008  . CKD (chronic kidney disease) 04/2007   CKD stage 4(Dr. Erling Cruz)  . Complication of anesthesia     DIFFICULTY BREATHING   . Coronary atherosclerosis of native coronary artery   . Diastolic heart failure 02/3784   grade 2 diastolic dysfunction per 04/8501 echo  . DNI (do not intubate)   . DNR (do not resuscitate)   . Esophageal dysmotility 2008   noted on esophagram.  hx dysphagia.   . Fatty liver 2008   noted on ultrasound 2008  . Fatty tumor fatty tumor back  . GERD (gastroesophageal reflux disease) 2012   Barrets esophagus on bx 2012 and 2014.   Marland Kitchen Gouty arthropathy   . Heart murmur   . Hyperlipidemia   . Hypertension   . IDDM (insulin dependent diabetes mellitus) ([place])    type 2.   . Morbid obesity ([place])   . Myocardial infarction ([place]) 2009  . Peripheral vascular disease ([place])   . Persistent atrial fibrillation ([facility])    chads2 vasc score of at least 5  . Sick sinus syndrome ([place])   . Sleep apnea    wears CPAP   Current Outpatient Prescriptions  Medication Sig Dispense Refill  . albuterol (PROVENTIL) (2.5 MG/3ML) 0.083% nebulizer solution Take 2.5 mg by nebulization every 6 (six) hours as needed for shortness of breath. Use four times a day as needed for shortness of breath or wheezing    . amiodarone (PACERONE) 100 MG tablet Take 2 tablets (200 mg total) by mouth daily. 60 tablet 6  .  amitriptyline (ELAVIL) 25 MG tablet Take 25 mg by mouth at bedtime.      Marland Kitchen atorvastatin (LIPITOR) 40 MG tablet TAKE ONE TABLET BY MOUTH ONCE DAILY 90 tablet 3  . CARTIA XT 180 MG 24 hr capsule TAKE ONE CAPSULE BY MOUTH ONCE DAILY 30 capsule 3  . dicyclomine (BENTYL) 10 MG capsule Take 10 mg by mouth daily.     Marland Kitchen ELIQUIS 5 MG TABS tablet TAKE ONE TABLET BY MOUTH TWICE DAILY 60 tablet 6  . esomeprazole (NEXIUM) 40 MG capsule TAKE 1 CAPSULE BY MOUTH TWICE DAILY BEFORE MEAL(S) 60 capsule  1  . hydrALAZINE (APRESOLINE) 25 MG tablet TAKE 1 TABLET BY MOUTH THREE TIMES DAILY 90 tablet 3  . insulin glargine (LANTUS) 100 UNIT/ML injection Inject 60 Units into the skin every morning.     . levalbuterol (XOPENEX) 0.31 MG/3ML nebulizer solution Take 1 ampule by nebulization every 4 (four) hours as needed for wheezing.    Marland Kitchen levothyroxine (SYNTHROID, LEVOTHROID) 25 MCG tablet Take 1 tablet by mouth daily.    Marland Kitchen LINZESS 290 MCG CAPS capsule     . metolazone (ZAROXOLYN) 2.5 MG tablet Take 1 tablet (2.5 mg total) by mouth 2 (two) times a week. Tuesday and Saturday 10 tablet 6  . metoprolol tartrate (LOPRESSOR) 25 MG tablet Take 3 tablets (75 mg total) by mouth 2 (two) times daily. 180 tablet 6  . omeprazole (PRILOSEC) 40 MG capsule Take 1 capsule (40 mg total) by mouth 2 (two) times daily before a meal. 120 capsule 3  . OXYGEN Inhale 3 L/min into the lungs continuous. Patient uses nightly    . potassium chloride SA (K-DUR,KLOR-CON) 20 MEQ tablet Take 4 tablets (80 mEq total) by mouth 2 (two) times daily. 720 tablet 3  . ranitidine (ZANTAC) 150 MG tablet Take 150 mg by mouth as needed for heartburn.    Marland Kitchen RELION PEN NEEDLES 32G X 4 MM MISC     . torsemide (DEMADEX) 100 MG tablet Take 100 mg by mouth 2 (two) times daily.    Marland Kitchen ULORIC 80 MG TABS Take 1 tablet by mouth daily.     . Vitamin D, Ergocalciferol, (DRISDOL) 50000 UNITS CAPS capsule Take 50,000 Units by mouth every Monday.    Penne Lash HFA 45 MCG/ACT inhaler  Inhale 2 puffs into the lungs every 4 (four) hours as needed for wheezing or shortness of breath. 1 Inhaler 1  . zolpidem (AMBIEN) 10 MG tablet Take 10 mg by mouth at bedtime.     . nitroGLYCERIN (NITROSTAT) 0.4 MG SL tablet Place 0.4 mg under the tongue every 5 (five) minutes as needed for chest pain (x 3 doses). Reported on 02/25/2016    . NOVOLOG FLEXPEN 100 UNIT/ML FlexPen      No current facility-administered medications for this encounter.     Vitals:   03/21/17 1351  BP: (!) 141/80  Pulse: 72  SpO2: 94%  Weight: 252 lb 12 oz (114.6 kg)    PHYSICAL EXAM: General: Obese, NAD, in wheelchair. Daughter present HEENT: normal  Neck: Thick, no JVD; Carotids 2+ bilat; no bruits. No lymphadenopathy or thryomegaly  Cor: PMI nonpalpable. Distant. Irregular rate. 1/6 SEM RUSB.  Lungs: Mild bibasilar crackles.   Abdomen: Obese, soft, nontender. mild-distended. No hepatosplenomegaly. No bruits or masses. +BS Extremities: no cyanosis, clubbing, rash, warm. 1+ ankle edema.   Neuro: alert & orientedx3, cranial nerves grossly intact. moves all 4 extremities w/o difficulty. Affect pleasant   ASSESSMENT & PLAN:  1) Chronic diastolic HF: Echo reviewed today, EF 55-60% with moderate LVH and normal RV. NYHA class IIIb symptoms.  However, she does not appear particularly volume overloaded.  Dyspnea likely has a prominent pulmonary component with restrictive PFTs from body habitus and interstitial lung disease (NSIP pattern by high resolution CT).  She is on torsemide 100 mg bid with metolazone twice a week.   - Continue torsemide 100 mg bid.  - Continue metolazone twice a week.   - BMET today.     2) Atrial fibrillation:  She remains on amiodarone.  I had her see pulmonary because  of interstitial lung disease, this is not thought to be due to amiodarone.  She has been in atrial fibrillation since 2/18.  She is not sure how much her symptoms have worsened with atrial fibrillation as she is very limited  at baseline.  She is very reticent to have a DCCV, as the day after her last DCCV she was admitted with dyspnea and found to have PNA/CHF. After discussion with [name] and her daughter today, we will try DCCV.  This may be the one way to significantly affect her symptoms at this point as I do not think she is particularly volume overloaded.  - Plan for DCCV next week. We discussed risks/benefits and she agrees to proceed.    - Continue amiodarone for now, increase to 200 mg daily prior to DCCV. Check TSH and LFTs today, will need regular eye exams. If DCCV is not successful, can stop amiodarone.  - No bleeding problems on Eliquis (h/o cecal AVM s/p ablation).  Continue Eliquis for now. Warfarin may be safer with her degree of renal dysfunction, can transition a couple of months after DCCV.  3) CKD stage IV: Most recent creatinine around 4. Sees nephrology.  She is very reluctant to undergo HD. BMET today.  4) Tachy/Brady syndrome: St Jude PPM.   5) OSA: Use CPAP. 6) Interstitial lung disease: Has ILD, may be RA-related.  Has seen Dr Amil Amen and follows with Dr Halford Chessman for pulmonology.  She is on home oxygen.   7) Hyperlipidemia: Check lipids today.   Followup after DCCV.     [name] 03/21/2017

## 2017-03-21 NOTE — Patient Instructions (Addendum)
Increase Amiodarone to 200 mg (2 tab) daily  Labs today  Your physician has recommended that you have a Cardioversion (DCCV). Electrical Cardioversion uses a jolt of electricity to your heart either through paddles or wired patches attached to your chest. This is a controlled, usually prescheduled, procedure. Defibrillation is done under light anesthesia in the hospital, and you usually go home the day of the procedure. This is done to get your heart back into a normal rhythm. You are not awake for the procedure. Please see the instruction sheet given to you today.  Your physician recommends that you schedule a follow-up appointment in: 1 month

## 2017-03-21 NOTE — Progress Notes (Signed)
  Echocardiogram 2D Echocardiogram has been performed.  [name] 03/21/2017, 1:54 PM

## 2017-03-22 ENCOUNTER — Other Ambulatory Visit (HOSPITAL_COMMUNITY): Payer: Self-pay | Admitting: *Deleted

## 2017-03-27 DIAGNOSIS — I509 Heart failure, unspecified: Secondary | ICD-10-CM | POA: Diagnosis not present

## 2017-03-29 ENCOUNTER — Telehealth (HOSPITAL_COMMUNITY): Payer: Self-pay

## 2017-03-29 NOTE — Telephone Encounter (Signed)
Patient calling to confirm med instructions for upcoming cardioversion tomorrow. Instructions with meds, apt date and time and registration process reviewed with patient. No further questions or concerns. Advised to return call to clinic for any further questions or concerns.  Renee Pain, RN

## 2017-03-30 ENCOUNTER — Ambulatory Visit (HOSPITAL_COMMUNITY): Payer: PPO | Admitting: Anesthesiology

## 2017-03-30 ENCOUNTER — Ambulatory Visit (HOSPITAL_COMMUNITY)
Admission: RE | Admit: 2017-03-30 | Discharge: 2017-03-30 | Disposition: A | Discharge status: Home / Self Care | Payer: PPO | Source: Ambulatory Visit | Attending: Cardiology | Admitting: Cardiology

## 2017-03-30 ENCOUNTER — Encounter (HOSPITAL_COMMUNITY)
Admission: RE | Disposition: A | Discharge status: Home / Self Care | Payer: Self-pay | Source: Ambulatory Visit | Attending: Cardiology

## 2017-03-30 ENCOUNTER — Encounter: Payer: Self-pay | Admitting: Cardiology

## 2017-03-30 ENCOUNTER — Encounter (HOSPITAL_COMMUNITY): Payer: Self-pay

## 2017-03-30 DIAGNOSIS — I4892 Unspecified atrial flutter: Secondary | ICD-10-CM | POA: Insufficient documentation

## 2017-03-30 DIAGNOSIS — Z79899 Other long term (current) drug therapy: Secondary | ICD-10-CM | POA: Diagnosis not present

## 2017-03-30 DIAGNOSIS — E785 Hyperlipidemia, unspecified: Secondary | ICD-10-CM | POA: Diagnosis not present

## 2017-03-30 DIAGNOSIS — E1151 Type 2 diabetes mellitus with diabetic peripheral angiopathy without gangrene: Secondary | ICD-10-CM | POA: Diagnosis not present

## 2017-03-30 DIAGNOSIS — I481 Persistent atrial fibrillation: Secondary | ICD-10-CM | POA: Insufficient documentation

## 2017-03-30 DIAGNOSIS — Z882 Allergy status to sulfonamides status: Secondary | ICD-10-CM | POA: Insufficient documentation

## 2017-03-30 DIAGNOSIS — I252 Old myocardial infarction: Secondary | ICD-10-CM | POA: Diagnosis not present

## 2017-03-30 DIAGNOSIS — I130 Hypertensive heart and chronic kidney disease with heart failure and stage 1 through stage 4 chronic kidney disease, or unspecified chronic kidney disease: Secondary | ICD-10-CM | POA: Insufficient documentation

## 2017-03-30 DIAGNOSIS — J849 Interstitial pulmonary disease, unspecified: Secondary | ICD-10-CM | POA: Insufficient documentation

## 2017-03-30 DIAGNOSIS — Z7901 Long term (current) use of anticoagulants: Secondary | ICD-10-CM | POA: Insufficient documentation

## 2017-03-30 DIAGNOSIS — I2510 Atherosclerotic heart disease of native coronary artery without angina pectoris: Secondary | ICD-10-CM | POA: Insufficient documentation

## 2017-03-30 DIAGNOSIS — Z6841 Body Mass Index (BMI) 40.0 and over, adult: Secondary | ICD-10-CM | POA: Insufficient documentation

## 2017-03-30 DIAGNOSIS — Z87891 Personal history of nicotine dependence: Secondary | ICD-10-CM | POA: Diagnosis not present

## 2017-03-30 DIAGNOSIS — Z888 Allergy status to other drugs, medicaments and biological substances status: Secondary | ICD-10-CM | POA: Insufficient documentation

## 2017-03-30 DIAGNOSIS — Z955 Presence of coronary angioplasty implant and graft: Secondary | ICD-10-CM | POA: Insufficient documentation

## 2017-03-30 DIAGNOSIS — G4733 Obstructive sleep apnea (adult) (pediatric): Secondary | ICD-10-CM | POA: Insufficient documentation

## 2017-03-30 DIAGNOSIS — Z8249 Family history of ischemic heart disease and other diseases of the circulatory system: Secondary | ICD-10-CM | POA: Diagnosis not present

## 2017-03-30 DIAGNOSIS — N184 Chronic kidney disease, stage 4 (severe): Secondary | ICD-10-CM | POA: Diagnosis not present

## 2017-03-30 DIAGNOSIS — I132 Hypertensive heart and chronic kidney disease with heart failure and with stage 5 chronic kidney disease, or end stage renal disease: Secondary | ICD-10-CM | POA: Diagnosis not present

## 2017-03-30 DIAGNOSIS — Z9981 Dependence on supplemental oxygen: Secondary | ICD-10-CM | POA: Insufficient documentation

## 2017-03-30 DIAGNOSIS — I5043 Acute on chronic combined systolic (congestive) and diastolic (congestive) heart failure: Secondary | ICD-10-CM | POA: Diagnosis not present

## 2017-03-30 DIAGNOSIS — Z950 Presence of cardiac pacemaker: Secondary | ICD-10-CM | POA: Diagnosis not present

## 2017-03-30 DIAGNOSIS — I5032 Chronic diastolic (congestive) heart failure: Secondary | ICD-10-CM | POA: Insufficient documentation

## 2017-03-30 DIAGNOSIS — Z66 Do not resuscitate: Secondary | ICD-10-CM | POA: Insufficient documentation

## 2017-03-30 DIAGNOSIS — Z880 Allergy status to penicillin: Secondary | ICD-10-CM | POA: Insufficient documentation

## 2017-03-30 DIAGNOSIS — J45909 Unspecified asthma, uncomplicated: Secondary | ICD-10-CM | POA: Diagnosis not present

## 2017-03-30 DIAGNOSIS — Z885 Allergy status to narcotic agent status: Secondary | ICD-10-CM | POA: Insufficient documentation

## 2017-03-30 DIAGNOSIS — N185 Chronic kidney disease, stage 5: Secondary | ICD-10-CM | POA: Diagnosis not present

## 2017-03-30 DIAGNOSIS — E1122 Type 2 diabetes mellitus with diabetic chronic kidney disease: Secondary | ICD-10-CM | POA: Insufficient documentation

## 2017-03-30 DIAGNOSIS — Z794 Long term (current) use of insulin: Secondary | ICD-10-CM | POA: Diagnosis not present

## 2017-03-30 DIAGNOSIS — I4891 Unspecified atrial fibrillation: Secondary | ICD-10-CM | POA: Diagnosis not present

## 2017-03-30 DIAGNOSIS — M109 Gout, unspecified: Secondary | ICD-10-CM | POA: Insufficient documentation

## 2017-03-30 DIAGNOSIS — K219 Gastro-esophageal reflux disease without esophagitis: Secondary | ICD-10-CM | POA: Diagnosis not present

## 2017-03-30 HISTORY — PX: CARDIOVERSION: SHX1299

## 2017-03-30 LAB — GLUCOSE, CAPILLARY: Glucose-Capillary: 171 mg/dL — ABNORMAL HIGH (ref 65–99)

## 2017-03-30 SURGERY — CARDIOVERSION
Anesthesia: Monitor Anesthesia Care

## 2017-03-30 MED ORDER — MEPERIDINE HCL 100 MG/ML IJ SOLN: 6.2500 mg | INTRAMUSCULAR | Status: DC | PRN

## 2017-03-30 MED ORDER — SODIUM CHLORIDE 0.9 % IV SOLN
INTRAVENOUS | Status: DC | PRN
  Administered 2017-03-30: 10:00:00 via INTRAVENOUS

## 2017-03-30 MED ORDER — LIDOCAINE 2% (20 MG/ML) 5 ML SYRINGE
INTRAMUSCULAR | Status: DC | PRN
  Administered 2017-03-30: 60 mg via INTRAVENOUS

## 2017-03-30 MED ORDER — ONDANSETRON HCL 4 MG/2ML IJ SOLN: 4.0000 mg | Freq: Once | INTRAMUSCULAR | Status: DC | PRN

## 2017-03-30 MED ORDER — PROPOFOL 10 MG/ML IV BOLUS
INTRAVENOUS | Status: DC | PRN
  Administered 2017-03-30: 50 mg via INTRAVENOUS

## 2017-03-30 MED ORDER — FENTANYL CITRATE (PF) 100 MCG/2ML IJ SOLN: 25.0000 ug | INTRAMUSCULAR | Status: DC | PRN

## 2017-03-30 MED ORDER — SODIUM CHLORIDE 0.9 % IV SOLN
INTRAVENOUS | Status: DC
  Administered 2017-03-30: 10:00:00 via INTRAVENOUS

## 2017-03-30 NOTE — Anesthesia Preprocedure Evaluation (Addendum)
Anesthesia Evaluation  Patient identified by MRN, date of birth, ID band Patient awake    Reviewed: Allergy & Precautions, NPO status , Patient's Chart, lab work & pertinent test results  Airway Mallampati: IV   Neck ROM: full    Dental  (+) Dental Advisory Given,    Pulmonary asthma , sleep apnea , former smoker,    breath sounds clear to auscultation       Cardiovascular hypertension, + angina + CAD, + Past MI, + Peripheral Vascular Disease and +CHF   Rhythm:regular Rate:Normal     Neuro/Psych  Neuromuscular disease    GI/Hepatic GERD  ,  Endo/Other  diabetes, Type 2Morbid obesity  Renal/GU Renal InsufficiencyRenal disease     Musculoskeletal  (+) Arthritis ,   Abdominal   Peds  Hematology   Anesthesia Other Findings 7/18 ECHO    - Left ventricle: The cavity size was normal. Wall thickness was   increased in a pattern of moderate LVH. Systolic function was   normal. The estimated ejection fraction was in the range of 55%   to 60%. Indeterminant diastolic function.   Reproductive/Obstetrics                           Anesthesia Physical  Anesthesia Plan  ASA: III  Anesthesia Plan: MAC   Post-op Pain Management:    Induction: Intravenous  PONV Risk Score and Plan: 2 and Ondansetron, Dexamethasone and Treatment may vary due to age or medical condition  Airway Management Planned: Simple Face Mask, Nasal Cannula and Natural Airway  Additional Equipment:   Intra-op Plan:   Post-operative Plan:   Informed Consent: I have reviewed the patients History and Physical, chart, labs and discussed the procedure including the risks, benefits and alternatives for the proposed anesthesia with the patient or authorized representative who has indicated his/her understanding and acceptance.     Plan Discussed with: CRNA, Anesthesiologist and Surgeon  Anesthesia Plan Comments:          Anesthesia Quick Evaluation

## 2017-03-30 NOTE — Transfer of Care (Signed)
Immediate Anesthesia Transfer of Care Note  Patient: [name]  Procedure(s) Performed: Procedure(s): CARDIOVERSION (N/A)  Patient Location: Endoscopy Unit  Anesthesia Type:General  Level of Consciousness: awake, alert  and oriented  Airway & Oxygen Therapy: Patient Spontanous Breathing and Patient connected to nasal cannula oxygen  Post-op Assessment: Report given to RN, Post -op Vital signs reviewed and stable and Patient moving all extremities X 4  Post vital signs: Reviewed and stable  Last Vitals:  Vitals:   03/30/17 0916  BP: (!) 163/89  Pulse: 76  Resp: 16  Temp: 36.5 C    Last Pain:  Vitals:   03/30/17 0916  TempSrc: Oral         Complications: No apparent anesthesia complications

## 2017-03-30 NOTE — Procedures (Signed)
Electrical Cardioversion Procedure Note [name] [id number] [birth year]  Procedure: Electrical Cardioversion Indications:  Atrial Fibrillation  Procedure Details Consent: Risks of procedure as well as the alternatives and risks of each were explained to the (patient/caregiver).  Consent for procedure obtained. Time Out: Verified patient identification, verified procedure, site/side was marked, verified correct patient position, special equipment/implants available, medications/allergies/relevent history reviewed, required imaging and test results available.  Performed  Patient placed on cardiac monitor, pulse oximetry, supplemental oxygen as necessary.  Sedation given: Propofol per anesthesiology Pacer pads placed anterior and posterior chest.  Cardioverted 1 time(s).  Cardioverted at [place].  Evaluation Findings: Post procedure EKG shows: NSR Complications: None Patient did tolerate procedure well. St Jude pacemaker checked after procedure and working properly.    [name] 03/30/2017, 12:14 PM

## 2017-03-30 NOTE — Progress Notes (Signed)
Noticed in patient's history that she was DNR, DNI.  When asked about this, patient stated "I want CPR done.  I only want to be DNR if I have a cancer that can't be cured."  Asked if patient had a living will, and patient stated "It is at home."  Encouraged patient to bring form to her next MD appointment so it can be scanned into the system.

## 2017-03-30 NOTE — H&P (View-Only) (Signed)
Patient ID: [name], female   DOB: May 27, 1946, 71 y.o.   MRN: [id number] PCP: Dr. Alyson Ingles  Nephrologist: Dr. Florene Glen Cardiology: Dr Aundra Dubin  HPI: [name] is a 71 year-old woman with hypertension, hyperlipidemia, bronchial asthma, diabetes mellitus, CKD stage IV (baseline cr ~2.5), CAD, atrial fibrillation and diastolic HF.  Cath 5/12 with Promus DES to proximal LCx.  RCA was totally occluded with collaterals.   She was admitted in 4/15 for R TKR. That hospitalization complicated by a/c diastolic HF with respiratory distress requiring non-rebreather support. Weight on discharge was 226 pounds. ABG at that time 7.4/34/91/97%. Cr peaked at 2.9  Admitted 6/30-7/10/15 for SOB and CP following scheduled cardioversion. Found to have severe aspiration PNA and was intubated. She maintained SR for short period of time and then went back into Afib/Aflutter.  HR controlled on amio and diltiazem. Discharged to [facility] at a weight of 232 lbs.   Admitted 06/16/14 with symptomatic tachy/brady syndrome. 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 Discharge weight was 223 pounds.   Admitted 02/16/15 for symptomatic anemia s/p blood work from her pulmonologist.  Was diuresed with lasix while in hospital.   PFTs (6/16) with FVC 80%, FEV1 83%, ratio 103%, TLC 75%, DLCO 78% => restriction c/w interstitial process.  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   Holter (7/16) with rare PACs, PVCs; no atrial fibrillation.  After her CT showing interstitial fibrosis, she saw pulmonary. There was concern for connective tissue disease with lung involvement.  She has seen rheumatology and was told that she likely has rheumatoid arthritis.  The pulmonologist did not think that her ILD was due to amiodarone, so she is still on amiodarone.    Peripheral arterial dopplers in 6/18 showed 50-74% left mid SFA stenosis and 30-49% right  mid SFA stenosis.    Echo was done today and reviewed: EF 55-60% with moderate LVH, normal RV size and systolic function, mild MR.    Continues to wear home oxygen and uses CPAP at night.    She has been in atrial fibrillation persistently since 2/18.  She is in atrial fibrillation today (by exam and by echo). She is very limited.  Short of breath walking around her house chronically.  Very hard to walk up stairs.  No orthopnea/PND.  No chest pain.  She feels numbness in her feet and pain in her thighs bilaterally "all the time."   Creatinine has gradually worsened.  She does not want HD.   Labs: 03/13/14: K+ 3.4, creatinine 2.10, BUN 16 04/02/14: K 5.6, creatinine 2.66, BUN 29 04/13/14: K 4.0, creatinine 2.66, K 5.6 06/20/14 K 3.7 creatinine 2.35  07/21/14 K 3.6 creatinine 2.55 2/16 K 3.8, creatinine 3.03, LFTs normal, TSH normal, hgb 12.8  5/16 K 3.5, creatinine 3.37, LFTs normal, TSH mildly elevated, T3 just below normal, T4 normal. BNP 147 6/16 K 3.9, creatinine 2.98, BNP 364.2, SCL-70 negative, RF elevated but CCP negative, ANA negative, ESR 48.  7/16 K 3.4, creatinine 3.31 LFTs normal, TSH and T4 normal, T3 just below normal, HCT 42.4 11/16: K 3.5, creatinine 3.4, HCT 45.1 2/17: K 3, creatinine 3.22, LFTs normal, TSH normal, HCT 46.9 7/17: K 3.6, creatinine 3.6, HCT 43.3, LFTs normal, TSH normal 5/18: K 4.5, creatinine 4.07, TSH normal, LFTs normal, hgb 14.6  ROS: All systems negative except as listed in HPI, PMH and Problem List.  SH:  Social History   Social History  .  Marital status: Widowed    Spouse name: N/A  . Number of children: 3  . Years of education: N/A   Occupational History  . Disabled    Social History Main Topics  . Smoking status: Former Smoker    Packs/day: 0.50    Years: 35.00    Types: Cigarettes    Quit date: 02/16/1995  . Smokeless tobacco: Never Used     Comment: 05/2014  QUIT OVER 20 YEARS AGO "  . Alcohol use No  . Drug use: No  . Sexual  activity: Not Currently   Other Topics Concern  . Not on file   Social History Narrative  . No narrative on file    FH:  Family History  Problem Relation Age of Onset  . Heart disease Mother   . Bladder Cancer Mother   . Kidney disease Mother   . Ovarian cancer Daughter   . Stomach cancer Maternal Uncle   . Colon cancer Maternal Aunt        dx in her 68's  . Esophageal cancer Neg Hx     Past Medical History:  Diagnosis Date  . Anginal pain ([place])    occ; non-ischemic Lexiscan 09/2012  . Arthritis   . Asthma   . Atrial flutter ([place])    ablated by Dr Lovena Le in 2008  . CKD (chronic kidney disease) 04/2007   CKD stage 4(Dr. Erling Cruz)  . Complication of anesthesia     DIFFICULTY BREATHING   . Coronary atherosclerosis of native coronary artery   . Diastolic heart failure 12/9824   grade 2 diastolic dysfunction per 12/1581 echo  . DNI (do not intubate)   . DNR (do not resuscitate)   . Esophageal dysmotility 2008   noted on esophagram.  hx dysphagia.   . Fatty liver 2008   noted on ultrasound 2008  . Fatty tumor fatty tumor back  . GERD (gastroesophageal reflux disease) 2012   Barrets esophagus on bx 2012 and 2014.   Marland Kitchen Gouty arthropathy   . Heart murmur   . Hyperlipidemia   . Hypertension   . IDDM (insulin dependent diabetes mellitus) ([place])    type 2.   . Morbid obesity ([place])   . Myocardial infarction ([place]) 2009  . Peripheral vascular disease ([place])   . Persistent atrial fibrillation ([facility])    chads2 vasc score of at least 5  . Sick sinus syndrome ([place])   . Sleep apnea    wears CPAP   Current Outpatient Prescriptions  Medication Sig Dispense Refill  . albuterol (PROVENTIL) (2.5 MG/3ML) 0.083% nebulizer solution Take 2.5 mg by nebulization every 6 (six) hours as needed for shortness of breath. Use four times a day as needed for shortness of breath or wheezing    . amiodarone (PACERONE) 100 MG tablet Take 2 tablets (200 mg total) by mouth daily. 60 tablet 6  .  amitriptyline (ELAVIL) 25 MG tablet Take 25 mg by mouth at bedtime.      Marland Kitchen atorvastatin (LIPITOR) 40 MG tablet TAKE ONE TABLET BY MOUTH ONCE DAILY 90 tablet 3  . CARTIA XT 180 MG 24 hr capsule TAKE ONE CAPSULE BY MOUTH ONCE DAILY 30 capsule 3  . dicyclomine (BENTYL) 10 MG capsule Take 10 mg by mouth daily.     Marland Kitchen ELIQUIS 5 MG TABS tablet TAKE ONE TABLET BY MOUTH TWICE DAILY 60 tablet 6  . esomeprazole (NEXIUM) 40 MG capsule TAKE 1 CAPSULE BY MOUTH TWICE DAILY BEFORE MEAL(S) 60 capsule  1  . hydrALAZINE (APRESOLINE) 25 MG tablet TAKE 1 TABLET BY MOUTH THREE TIMES DAILY 90 tablet 3  . insulin glargine (LANTUS) 100 UNIT/ML injection Inject 60 Units into the skin every morning.     . levalbuterol (XOPENEX) 0.31 MG/3ML nebulizer solution Take 1 ampule by nebulization every 4 (four) hours as needed for wheezing.    Marland Kitchen levothyroxine (SYNTHROID, LEVOTHROID) 25 MCG tablet Take 1 tablet by mouth daily.    Marland Kitchen LINZESS 290 MCG CAPS capsule     . metolazone (ZAROXOLYN) 2.5 MG tablet Take 1 tablet (2.5 mg total) by mouth 2 (two) times a week. Tuesday and Saturday 10 tablet 6  . metoprolol tartrate (LOPRESSOR) 25 MG tablet Take 3 tablets (75 mg total) by mouth 2 (two) times daily. 180 tablet 6  . omeprazole (PRILOSEC) 40 MG capsule Take 1 capsule (40 mg total) by mouth 2 (two) times daily before a meal. 120 capsule 3  . OXYGEN Inhale 3 L/min into the lungs continuous. Patient uses nightly    . potassium chloride SA (K-DUR,KLOR-CON) 20 MEQ tablet Take 4 tablets (80 mEq total) by mouth 2 (two) times daily. 720 tablet 3  . ranitidine (ZANTAC) 150 MG tablet Take 150 mg by mouth as needed for heartburn.    Marland Kitchen RELION PEN NEEDLES 32G X 4 MM MISC     . torsemide (DEMADEX) 100 MG tablet Take 100 mg by mouth 2 (two) times daily.    Marland Kitchen ULORIC 80 MG TABS Take 1 tablet by mouth daily.     . Vitamin D, Ergocalciferol, (DRISDOL) 50000 UNITS CAPS capsule Take 50,000 Units by mouth every Monday.    Penne Lash HFA 45 MCG/ACT inhaler  Inhale 2 puffs into the lungs every 4 (four) hours as needed for wheezing or shortness of breath. 1 Inhaler 1  . zolpidem (AMBIEN) 10 MG tablet Take 10 mg by mouth at bedtime.     . nitroGLYCERIN (NITROSTAT) 0.4 MG SL tablet Place 0.4 mg under the tongue every 5 (five) minutes as needed for chest pain (x 3 doses). Reported on 02/25/2016    . NOVOLOG FLEXPEN 100 UNIT/ML FlexPen      No current facility-administered medications for this encounter.     Vitals:   03/21/17 1351  BP: (!) 141/80  Pulse: 72  SpO2: 94%  Weight: 252 lb 12 oz (114.6 kg)    PHYSICAL EXAM: General: Obese, NAD, in wheelchair. Daughter present HEENT: normal  Neck: Thick, no JVD; Carotids 2+ bilat; no bruits. No lymphadenopathy or thryomegaly  Cor: PMI nonpalpable. Distant. Irregular rate. 1/6 SEM RUSB.  Lungs: Mild bibasilar crackles.   Abdomen: Obese, soft, nontender. mild-distended. No hepatosplenomegaly. No bruits or masses. +BS Extremities: no cyanosis, clubbing, rash, warm. 1+ ankle edema.   Neuro: alert & orientedx3, cranial nerves grossly intact. moves all 4 extremities w/o difficulty. Affect pleasant   ASSESSMENT & PLAN:  1) Chronic diastolic HF: Echo reviewed today, EF 55-60% with moderate LVH and normal RV. NYHA class IIIb symptoms.  However, she does not appear particularly volume overloaded.  Dyspnea likely has a prominent pulmonary component with restrictive PFTs from body habitus and interstitial lung disease (NSIP pattern by high resolution CT).  She is on torsemide 100 mg bid with metolazone twice a week.   - Continue torsemide 100 mg bid.  - Continue metolazone twice a week.   - BMET today.     2) Atrial fibrillation:  She remains on amiodarone.  I had her see pulmonary because  of interstitial lung disease, this is not thought to be due to amiodarone.  She has been in atrial fibrillation since 2/18.  She is not sure how much her symptoms have worsened with atrial fibrillation as she is very limited  at baseline.  She is very reticent to have a DCCV, as the day after her last DCCV she was admitted with dyspnea and found to have PNA/CHF. After discussion with Mrs Fehr and her daughter today, we will try DCCV.  This may be the one way to significantly affect her symptoms at this point as I do not think she is particularly volume overloaded.  - Plan for DCCV next week. We discussed risks/benefits and she agrees to proceed.    - Continue amiodarone for now, increase to 200 mg daily prior to DCCV. Check TSH and LFTs today, will need regular eye exams. If DCCV is not successful, can stop amiodarone.  - No bleeding problems on Eliquis (h/o cecal AVM s/p ablation).  Continue Eliquis for now. Warfarin may be safer with her degree of renal dysfunction, can transition a couple of months after DCCV.  3) CKD stage IV: Most recent creatinine around 4. Sees nephrology.  She is very reluctant to undergo HD. BMET today.  4) Tachy/Brady syndrome: St Jude PPM.   5) OSA: Use CPAP. 6) Interstitial lung disease: Has ILD, may be RA-related.  Has seen Dr Amil Amen and follows with Dr Halford Chessman for pulmonology.  She is on home oxygen.   7) Hyperlipidemia: Check lipids today.   Followup after DCCV.     [name] 03/21/2017

## 2017-03-30 NOTE — Anesthesia Procedure Notes (Signed)
Procedure Name: General with mask airway Date/Time: 03/30/2017 12:02 PM Performed by: Garrison [place] T Pre-anesthesia Checklist: Patient identified, Emergency Drugs available, Suction available and Patient being monitored Patient Re-evaluated:Patient Re-evaluated prior to induction Oxygen Delivery Method: Ambu bag Preoxygenation: Pre-oxygenation with 100% oxygen Induction Type: IV induction Placement Confirmation: positive ETCO2 and breath sounds checked- equal and bilateral Dental Injury: Teeth and Oropharynx as per pre-operative assessment

## 2017-03-30 NOTE — Anesthesia Postprocedure Evaluation (Signed)
Anesthesia Post Note  Patient: [name]  Procedure(s) Performed: Procedure(s) (LRB): CARDIOVERSION (N/A)     Patient location during evaluation: PACU Anesthesia Type: MAC Level of consciousness: awake and alert Pain management: pain level controlled Vital Signs Assessment: post-procedure vital signs reviewed and stable Respiratory status: spontaneous breathing, nonlabored ventilation, respiratory function stable and patient connected to nasal cannula oxygen Cardiovascular status: stable and blood pressure returned to baseline Anesthetic complications: no    Last Vitals:  Vitals:   03/30/17 1220 03/30/17 1230  BP: (!) 143/76 136/82  Pulse: 70 70  Resp: 20 16  Temp:      Last Pain:  Vitals:   03/30/17 0916  TempSrc: Oral                 [name]

## 2017-03-30 NOTE — Interval H&P Note (Signed)
History and Physical Interval Note:  03/30/2017 12:04 PM  [name]  has presented today for surgery, with the diagnosis of AFIB  The various methods of treatment have been discussed with the patient and family. After consideration of risks, benefits and other options for treatment, the patient has consented to  Procedure(s): CARDIOVERSION (N/A) as a surgical intervention .  The patient's history has been reviewed, patient examined, no change in status, stable for surgery.  I have reviewed the patient's chart and labs.  Questions were answered to the patient's satisfaction.     [place] [employer]

## 2017-04-02 ENCOUNTER — Other Ambulatory Visit (HOSPITAL_COMMUNITY): Payer: Self-pay | Admitting: Cardiology

## 2017-04-02 MED ORDER — AMIODARONE HCL 200 MG PO TABS: 200.0000 mg | ORAL_TABLET | Freq: Every day | ORAL | 6 refills | Status: DC

## 2017-04-02 NOTE — Telephone Encounter (Signed)
Requesting refill for amiodarone. Apt reminded for 8/31. Aware and agreeable.  Renee Pain, RN

## 2017-04-03 ENCOUNTER — Encounter (HOSPITAL_COMMUNITY): Payer: Self-pay | Admitting: Cardiology

## 2017-04-03 LAB — CUP PACEART REMOTE DEVICE CHECK
Battery Remaining Longevity: 116 mo
Battery Remaining Percentage: 95.5 %
Battery Voltage: 2.99 V
Brady Statistic AP VP Percent: 0 %
Brady Statistic AP VS Percent: 0 %
Brady Statistic AS VP Percent: 0 %
Brady Statistic AS VS Percent: 0 %
Brady Statistic RA Percent Paced: 0 %
Brady Statistic RV Percent Paced: 62 %
Date Time Interrogation Session: 20180705084528
Implantable Lead Implant Date: 20151015
Implantable Lead Implant Date: 20151015
Implantable Lead Location: 753859
Implantable Lead Location: 753860
Implantable Lead Model: 1948
Implantable Pulse Generator Implant Date: 20151015
Lead Channel Impedance Value: 450 Ohm
Lead Channel Impedance Value: 590 Ohm
Lead Channel Pacing Threshold Amplitude: 0.75 V
Lead Channel Pacing Threshold Amplitude: 1 V
Lead Channel Pacing Threshold Pulse Width: 0.4 ms
Lead Channel Pacing Threshold Pulse Width: 0.5 ms
Lead Channel Sensing Intrinsic Amplitude: 12 mV
Lead Channel Sensing Intrinsic Amplitude: 2.6 mV
Lead Channel Setting Pacing Amplitude: 1.25 V
Lead Channel Setting Pacing Amplitude: 1.75 V
Lead Channel Setting Pacing Pulse Width: 0.5 ms
Lead Channel Setting Sensing Sensitivity: 2 mV
Pulse Gen Model: 2240
Pulse Gen Serial Number: 7665001

## 2017-04-09 ENCOUNTER — Other Ambulatory Visit (HOSPITAL_COMMUNITY): Payer: Self-pay | Admitting: Cardiology

## 2017-04-09 MED ORDER — POTASSIUM CHLORIDE CRYS ER 20 MEQ PO TBCR: EXTENDED_RELEASE_TABLET | ORAL | 3 refills | Status: DC

## 2017-04-10 ENCOUNTER — Ambulatory Visit (INDEPENDENT_AMBULATORY_CARE_PROVIDER_SITE_OTHER): Payer: PPO | Admitting: Cardiovascular Disease

## 2017-04-10 ENCOUNTER — Encounter: Payer: Self-pay | Admitting: Cardiovascular Disease

## 2017-04-10 VITALS — BP 147/76 | HR 69 | Ht 64.0 in | Wt 245.0 lb

## 2017-04-10 DIAGNOSIS — I481 Persistent atrial fibrillation: Secondary | ICD-10-CM

## 2017-04-10 DIAGNOSIS — I4819 Other persistent atrial fibrillation: Secondary | ICD-10-CM

## 2017-04-10 DIAGNOSIS — I5032 Chronic diastolic (congestive) heart failure: Secondary | ICD-10-CM

## 2017-04-10 DIAGNOSIS — I739 Peripheral vascular disease, unspecified: Secondary | ICD-10-CM

## 2017-04-10 NOTE — Progress Notes (Signed)
Cardiology Office Note   Date:  04/10/2017   ID:  [name], DOB [birth year], MRN [id number]  PCP:  [name], MD  Cardiologist:  Dr. Aundra Dubin  Chief Complaint  Patient presents with  . Follow-up      History of Present Illness: [name] is a 71 y.o. female who  was referred for evaluation and management of peripheral arterial disease. She has extensive medical problems including chronic diastolic heart failure, atrial fibrillation, coronary artery disease with previous stenting of the left circumflex with known chronic occlusion of the right coronary artery, essential hypertension, hyperlipidemia, asthma, diabetes mellitus and advanced chronic kidney disease. She had heart failure in April after right knee replacement. She had a previous pacemaker placement in 2015. She underwent recent cardioversion by Dr. Aundra Dubin. She complains of bilateral leg pain and thus she was referred for vascular evaluation. She had noninvasive vascular evaluation in June which showed an ABI of 0.85 bilaterally.. Duplex showed moderate bilateral SFA disease. She complains of numbness and burning sensation in both feet. She also has chronic leg edema with occasional ulcerations that usually heal. She has callus and bunions. Her mobility is very limited and she uses a wheelchair frequently. She is able to use a walker at home.  Past Medical History:  Diagnosis Date  . Anginal pain ([place])    occ; non-ischemic Lexiscan 09/2012  . Arthritis   . Asthma   . Atrial flutter ([place])    ablated by Dr Lovena Le in 2008  . CKD (chronic kidney disease) 04/2007   CKD stage 4(Dr. Erling Cruz)  . Complication of anesthesia     DIFFICULTY BREATHING   . Coronary atherosclerosis of native coronary artery   . Diastolic heart failure 10/3298   grade 2 diastolic dysfunction per 03/6225 echo  . DNI (do not intubate)   . DNR (do not resuscitate)   . Esophageal dysmotility 2008   noted on esophagram.  hx dysphagia.     . Fatty liver 2008   noted on ultrasound 2008  . Fatty tumor fatty tumor back  . GERD (gastroesophageal reflux disease) 2012   Barrets esophagus on bx 2012 and 2014.   Marland Kitchen Gouty arthropathy   . Heart murmur   . Hyperlipidemia   . Hypertension   . 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   . Morbid obesity ([place])   . Myocardial infarction ([place]) 2009  . Peripheral vascular disease ([place])   . Persistent atrial fibrillation (H[facility])    chads2 vasc score of at least 5  . Sick sinus syndrome ([place])   . Sleep apnea    wears CPAP    Past Surgical History:  Procedure Laterality Date  . ATRIAL FLUTTER ABLATION  2008   CTI ablation by Dr Lovena Le  . BREAST LUMPECTOMY     right  . CARDIOVERSION N/A 03/03/2014   Procedure: CARDIOVERSION;  Surgeon: Laverda Page, MD;  Location: [place];  Service: Cardiovascular;  Laterality: N/A;  . CARDIOVERSION N/A 03/30/2017   Procedure: CARDIOVERSION;  Surgeon: Larey Dresser, MD;  Location: [facility] ENDOSCOPY;  Service: Cardiovascular;  Laterality: N/A;  . COLONOSCOPY WITH PROPOFOL N/A 02/18/2015   Procedure: COLONOSCOPY WITH PROPOFOL;  Surgeon: Milus Banister, MD;  Location: [place];  Service: Endoscopy;  Laterality: N/A;  . CORONARY ANGIOPLASTY WITH STENT PLACEMENT    . PACEMAKER INSERTION  06/18/14   STJ Assurity dual chamber pacemaker implanted by Dr Rayann Heman  . PERMANENT PACEMAKER  INSERTION N/A 06/18/2014   SJM Assurity DR pacemaker implanted by Dr Rayann Heman for sick sinus syndrome  . RIGHT HEART CATHETERIZATION N/A 06/12/2014   Procedure: RIGHT HEART CATH;  Surgeon: Jolaine Artist, MD;  Location: [facility] CATH LAB;  Service: Cardiovascular;  Laterality: N/A;  . TONSILLECTOMY    . TOTAL KNEE ARTHROPLASTY Right 12/15/2013   Procedure: RIGHT TOTAL KNEE ARTHROPLASTY;  Surgeon: Alta Corning, MD;  Location: [place];  Service: Orthopedics;  Laterality: Right;  . TUBAL LIGATION    . VIDEO BRONCHOSCOPY Bilateral 12/23/2015   Procedure: VIDEO  BRONCHOSCOPY WITH FLUORO;  Surgeon: Chesley Mires, MD;  Location: [facility] ENDOSCOPY;  Service: Cardiopulmonary;  Laterality: Bilateral;     Current Outpatient Prescriptions  Medication Sig Dispense Refill  . albuterol (PROVENTIL) (2.5 MG/3ML) 0.083% nebulizer solution Take 2.5 mg by nebulization every 6 (six) hours as needed for shortness of breath. Use four times a day as needed for shortness of breath or wheezing    . amiodarone (PACERONE) 200 MG tablet Take 1 tablet (200 mg total) by mouth daily. 30 tablet 6  . amitriptyline (ELAVIL) 25 MG tablet Take 25 mg by mouth at bedtime.      Marland Kitchen atorvastatin (LIPITOR) 40 MG tablet TAKE ONE TABLET BY MOUTH ONCE DAILY 90 tablet 3  . CARTIA XT 180 MG 24 hr capsule TAKE ONE CAPSULE BY MOUTH ONCE DAILY 30 capsule 3  . dicyclomine (BENTYL) 10 MG capsule Take 10 mg by mouth daily.     Marland Kitchen ELIQUIS 5 MG TABS tablet TAKE ONE TABLET BY MOUTH TWICE DAILY 60 tablet 6  . esomeprazole (NEXIUM) 40 MG capsule TAKE 1 CAPSULE BY MOUTH TWICE DAILY BEFORE MEAL(S) 60 capsule 1  . hydrALAZINE (APRESOLINE) 25 MG tablet TAKE 1 TABLET BY MOUTH THREE TIMES DAILY 90 tablet 3  . insulin glargine (LANTUS) 100 UNIT/ML injection Inject 60 Units into the skin every morning.     . levalbuterol (XOPENEX) 0.31 MG/3ML nebulizer solution Take 1 ampule by nebulization every 4 (four) hours as needed for wheezing.    Marland Kitchen levothyroxine (SYNTHROID, LEVOTHROID) 25 MCG tablet Take 1 tablet by mouth daily.    Marland Kitchen LINZESS 290 MCG CAPS capsule     . metolazone (ZAROXOLYN) 2.5 MG tablet Take 1 tablet (2.5 mg total) by mouth 2 (two) times a week. Tuesday and Saturday 10 tablet 6  . metoprolol tartrate (LOPRESSOR) 25 MG tablet Take 3 tablets (75 mg total) by mouth 2 (two) times daily. 180 tablet 6  . nitroGLYCERIN (NITROSTAT) 0.4 MG SL tablet Place 0.4 mg under the tongue every 5 (five) minutes as needed for chest pain (x 3 doses). Reported on 02/25/2016    . NOVOLOG FLEXPEN 100 UNIT/ML FlexPen     . omeprazole  (PRILOSEC) 40 MG capsule Take 1 capsule (40 mg total) by mouth 2 (two) times daily before a meal. 120 capsule 3  . OXYGEN Inhale 3 L/min into the lungs continuous. Patient uses nightly    . potassium chloride SA (KLOR-CON M20) 20 MEQ tablet TAKE FOUR TABLETS BY MOUTH TWICE DAILY 720 tablet 3  . ranitidine (ZANTAC) 150 MG tablet Take 150 mg by mouth as needed for heartburn.    Marland Kitchen RELION PEN NEEDLES 32G X 4 MM MISC     . torsemide (DEMADEX) 100 MG tablet Take 100 mg by mouth 2 (two) times daily.    Marland Kitchen ULORIC 80 MG TABS Take 1 tablet by mouth daily.     . Vitamin D, Ergocalciferol, (DRISDOL)  50000 UNITS CAPS capsule Take 50,000 Units by mouth every Monday.    Penne Lash HFA 45 MCG/ACT inhaler Inhale 2 puffs into the lungs every 4 (four) hours as needed for wheezing or shortness of breath. 1 Inhaler 1  . zolpidem (AMBIEN) 10 MG tablet Take 10 mg by mouth at bedtime.      No current facility-administered medications for this visit.     Allergies:   Adhesive [tape]; Sulfa antibiotics; Codeine; and Penicillins    Social History:  The patient  reports that she quit smoking about 22 years ago. Her smoking use included Cigarettes. She has a 17.50 pack-year smoking history. She has never used smokeless tobacco. She reports that she does not drink alcohol or use drugs.   Family History:  The patient's family history includes Bladder Cancer in her mother; Colon cancer in her maternal aunt; Heart disease in her mother; Kidney disease in her mother; Ovarian cancer in her daughter; Stomach cancer in her maternal uncle.    ROS:  Please see the history of present illness.   Otherwise, review of systems are positive for none.   All other systems are reviewed and negative.    PHYSICAL EXAM: VS:  BP (!) 147/76   Pulse 69   Ht 5\' 4"  (1.626 m)   Wt 245 lb (111.1 kg)   BMI 42.05 kg/m  , BMI Body mass index is 42.05 kg/m. GEN: Well nourished, well developed, in no acute distress  HEENT: normal  Neck: no JVD,  carotid bruits, or masses Cardiac: RRR; no murmurs, rubs, or gallops.There Respiratory:  clear to auscultation bilaterally, normal work of breathing GI: soft, nontender, nondistended, + BS MS: no deformity or atrophy  Skin: warm and dry, no rash Neuro:  Strength and sensation are intact Psych: euthymic mood, full affect +1 edema bilaterally with mild stasis dermatitis. No open wounds. Distal pulses are not palpable.  EKG:  EKG is not ordered today.    Recent Labs: 01/19/2017: Hemoglobin 14.6; Platelets 189 03/21/2017: ALT 12; BUN 56; Creatinine, Ser 4.27; Potassium 4.2; Sodium 140; TSH 3.112    Lipid Panel    Component Value Date/Time   CHOL 165 03/21/2017 1421   TRIG 293 (H) 03/21/2017 1421   HDL 54 03/21/2017 1421   CHOLHDL 3.1 03/21/2017 1421   VLDL 59 (H) 03/21/2017 1421   LDLCALC 52 03/21/2017 1421      Wt Readings from Last 3 Encounters:  04/10/17 245 lb (111.1 kg)  03/30/17 247 lb (112 kg)  03/21/17 252 lb 12 oz (114.6 kg)        No flowsheet data found.    ASSESSMENT AND PLAN:  1.  Peripheral arterial disease: The patient seems to have mild to moderate peripheral arterial disease overall. ABI was only mildly reduced on duplex showed moderate SFA disease. She has no convincing symptoms of claudication and her mobility is very limited. She has no evidence of open wounds or ulceration. I recommend continuing medical therapy. A lot of her leg pain symptoms seem to be multifactorial due to peripheral neuropathy and chronic venous insufficiency in addition to chronic leg edema related to diastolic heart failure. I instructed her about proper foot hygiene. She should consider regular follow-up with podiatry given her diabetes status.  2. Atrial fibrillation: Status post recent cardioversion. She has regular rate today. Continue same medications.  3. Chronic diastolic heart failure: She appears to be euvolemic.  4. Advanced chronic kidney disease: I strongly  advised her to follow-up with  her nephrologist as her kidney function seems to be getting worse.    Disposition:   FU with me as needed.   Signed,  Kathlyn Sacramento, MD  04/10/2017 8:54 AM    [place]

## 2017-04-10 NOTE — Patient Instructions (Signed)
NO CHANGE WITH CURRENT TREATMENT.    Your physician recommends that you schedule a follow-up appointment ON AN AS NEEDED BASIS.

## 2017-04-20 DIAGNOSIS — R0602 Shortness of breath: Secondary | ICD-10-CM | POA: Diagnosis not present

## 2017-04-27 DIAGNOSIS — I509 Heart failure, unspecified: Secondary | ICD-10-CM | POA: Diagnosis not present

## 2017-05-04 ENCOUNTER — Ambulatory Visit (HOSPITAL_COMMUNITY)
Admission: RE | Admit: 2017-05-04 | Discharge: 2017-05-04 | Disposition: A | Discharge status: Home / Self Care | Payer: PPO | Source: Ambulatory Visit | Attending: Cardiology | Admitting: Cardiology

## 2017-05-04 ENCOUNTER — Encounter (HOSPITAL_COMMUNITY): Payer: Self-pay | Admitting: Cardiology

## 2017-05-04 VITALS — BP 141/76 | HR 70 | Wt 253.0 lb

## 2017-05-04 DIAGNOSIS — Z79899 Other long term (current) drug therapy: Secondary | ICD-10-CM | POA: Insufficient documentation

## 2017-05-04 DIAGNOSIS — J849 Interstitial pulmonary disease, unspecified: Secondary | ICD-10-CM | POA: Insufficient documentation

## 2017-05-04 DIAGNOSIS — I130 Hypertensive heart and chronic kidney disease with heart failure and stage 1 through stage 4 chronic kidney disease, or unspecified chronic kidney disease: Secondary | ICD-10-CM | POA: Diagnosis not present

## 2017-05-04 DIAGNOSIS — I4892 Unspecified atrial flutter: Secondary | ICD-10-CM | POA: Insufficient documentation

## 2017-05-04 DIAGNOSIS — I481 Persistent atrial fibrillation: Secondary | ICD-10-CM | POA: Insufficient documentation

## 2017-05-04 DIAGNOSIS — E1122 Type 2 diabetes mellitus with diabetic chronic kidney disease: Secondary | ICD-10-CM | POA: Insufficient documentation

## 2017-05-04 DIAGNOSIS — E785 Hyperlipidemia, unspecified: Secondary | ICD-10-CM | POA: Insufficient documentation

## 2017-05-04 DIAGNOSIS — E1151 Type 2 diabetes mellitus with diabetic peripheral angiopathy without gangrene: Secondary | ICD-10-CM | POA: Insufficient documentation

## 2017-05-04 DIAGNOSIS — J45909 Unspecified asthma, uncomplicated: Secondary | ICD-10-CM | POA: Diagnosis not present

## 2017-05-04 DIAGNOSIS — G4733 Obstructive sleep apnea (adult) (pediatric): Secondary | ICD-10-CM | POA: Diagnosis not present

## 2017-05-04 DIAGNOSIS — I2510 Atherosclerotic heart disease of native coronary artery without angina pectoris: Secondary | ICD-10-CM | POA: Insufficient documentation

## 2017-05-04 DIAGNOSIS — K219 Gastro-esophageal reflux disease without esophagitis: Secondary | ICD-10-CM | POA: Diagnosis not present

## 2017-05-04 DIAGNOSIS — I5032 Chronic diastolic (congestive) heart failure: Secondary | ICD-10-CM | POA: Diagnosis not present

## 2017-05-04 DIAGNOSIS — N184 Chronic kidney disease, stage 4 (severe): Secondary | ICD-10-CM | POA: Insufficient documentation

## 2017-05-04 DIAGNOSIS — I4891 Unspecified atrial fibrillation: Secondary | ICD-10-CM | POA: Diagnosis not present

## 2017-05-04 LAB — BASIC METABOLIC PANEL
Anion gap: 17 — ABNORMAL HIGH (ref 5–15)
BUN: 47 mg/dL — ABNORMAL HIGH (ref 6–20)
CO2: 18 mmol/L — ABNORMAL LOW (ref 22–32)
Calcium: 9.3 mg/dL (ref 8.9–10.3)
Chloride: 106 mmol/L (ref 101–111)
Creatinine, Ser: 3.77 mg/dL — ABNORMAL HIGH (ref 0.44–1.00)
GFR calc Af Amer: 13 mL/min — ABNORMAL LOW (ref >60)
GFR calc non Af Amer: 11 mL/min — ABNORMAL LOW (ref >60)
Glucose, Bld: 178 mg/dL — ABNORMAL HIGH (ref 65–99)
Potassium: 4.7 mmol/L (ref 3.5–5.1)
Sodium: 141 mmol/L (ref 135–145)

## 2017-05-04 LAB — MAGNESIUM: Magnesium: 1.6 mg/dL — ABNORMAL LOW (ref 1.7–2.4)

## 2017-05-04 NOTE — Progress Notes (Signed)
Patient ID: [name], female   DOB: May 06, 1946, 71 y.o.   MRN: [id number]   PCP: Dr. Alyson Ingles  Nephrologist: Dr. Florene Glen Cardiology: Dr Aundra Dubin  HPI: [name] is a 71 year-old woman with hypertension, hyperlipidemia, bronchial asthma, diabetes mellitus, CKD stage IV (baseline cr ~2.5), CAD, atrial fibrillation and diastolic HF.  Cath 5/12 with Promus DES to proximal LCx.  RCA was totally occluded with collaterals.   She was admitted in 4/15 for R TKR. That hospitalization complicated by a/c diastolic HF with respiratory distress requiring non-rebreather support. Weight on discharge was 226 pounds. ABG at that time 7.4/34/91/97%. Cr peaked at 2.9  Admitted 6/30-7/10/15 for SOB and CP following scheduled cardioversion. Found to have severe aspiration PNA and was intubated. She maintained SR for short period of time and then went back into Afib/Aflutter.  HR controlled on amio and diltiazem. Discharged to [facility] at a weight of 232 lbs.   Admitted 06/16/14 with symptomatic tachy/brady syndrome. 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 Discharge weight was 223 pounds.   Admitted 02/16/15 for symptomatic anemia s/p blood work from her pulmonologist.  Was diuresed with lasix while in hospital.   PFTs (6/16) with FVC 80%, FEV1 83%, ratio 103%, TLC 75%, DLCO 78% => restriction c/w interstitial process.  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   Holter (7/16) with rare PACs, PVCs; no atrial fibrillation.  After her CT showing interstitial fibrosis, she saw pulmonary. There was concern for connective tissue disease with lung involvement.  She has seen rheumatology and was told that she likely has rheumatoid arthritis.  The pulmonologist did not think that her ILD was due to amiodarone, so she is still on amiodarone.    Peripheral arterial dopplers in 6/18 showed 50-74% left mid SFA stenosis and 30-49%  right mid SFA stenosis.    Echo 03/21/17 EF 55-60% with moderate LVH, normal RV size and systolic function, mild MR.    S/p DCCV 03/30/17 with return to NSR.   She presents today for post DCCV follow up. EKG shows NSR. She feels better in NSR. Can walk around the house now without SOB. Still limited by stairs but not as much. Continues with neuropathy in both legs and getting worse. Hasn't followed up with PCP or Endocrine in about a year she thinks. She sees Dr. Florene Glen in the next few weeks. She denies orthopnea. Mild bendopnea. Wearing CPAP every night. Wears 02 3 L at home.   ECG (personally reviewed): a-paced, lateral TWIs  Labs: 11/16: K 3.5, creatinine 3.4, HCT 45.1 2/17: K 3, creatinine 3.22, LFTs normal, TSH normal, HCT 46.9 7/17: K 3.6, creatinine 3.6, HCT 43.3, LFTs normal, TSH normal 5/18: K 4.5, creatinine 4.07, TSH normal, LFTs normal, hgb 14.6 7/18: K 4.2, creatinine 4.27, LDL 52, TSH normal  Review of systems complete and found to be negative unless listed in HPI.    SH:  Social History   Social History  . Marital status: Widowed    Spouse name: N/A  . Number of children: 3  . Years of education: N/A   Occupational History  . Disabled    Social History Main Topics  . Smoking status: Former Smoker    Packs/day: 0.50    Years: 35.00    Types: Cigarettes    Quit date: 02/16/1995  . Smokeless tobacco: Never Used     Comment: 05/2014  QUIT OVER 20 YEARS AGO "  .  Alcohol use No  . Drug use: No  . Sexual activity: Not Currently   Other Topics Concern  . Not on file   Social History Narrative  . No narrative on file    FH:  Family History  Problem Relation Age of Onset  . Heart disease Mother   . Bladder Cancer Mother   . Kidney disease Mother   . Ovarian cancer Daughter   . Stomach cancer Maternal Uncle   . Colon cancer Maternal Aunt        dx in her 30's  . Esophageal cancer Neg Hx     Past Medical History:  Diagnosis Date  . Anginal pain ([place])     occ; non-ischemic Lexiscan 09/2012  . Arthritis   . Asthma   . Atrial flutter ([place])    ablated by Dr Lovena Le in 2008  . CKD (chronic kidney disease) 04/2007   CKD stage 4(Dr. Erling Cruz)  . Complication of anesthesia     DIFFICULTY BREATHING   . Coronary atherosclerosis of native coronary artery   . Diastolic heart failure 11/2353   grade 2 diastolic dysfunction per 03/3219 echo  . DNI (do not intubate)   . DNR (do not resuscitate)   . Esophageal dysmotility 2008   noted on esophagram.  hx dysphagia.   . Fatty liver 2008   noted on ultrasound 2008  . Fatty tumor fatty tumor back  . GERD (gastroesophageal reflux disease) 2012   Barrets esophagus on bx 2012 and 2014.   Marland Kitchen Gouty arthropathy   . Heart murmur   . Hyperlipidemia   . Hypertension   . 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   . Morbid obesity ([place])   . Myocardial infarction ([place]) 2009  . Peripheral vascular disease ([place])   . Persistent atrial fibrillation (H[facility])    chads2 vasc score of at least 5  . Sick sinus syndrome ([place])   . Sleep apnea    wears CPAP   Current Outpatient Prescriptions  Medication Sig Dispense Refill  . albuterol (PROVENTIL) (2.5 MG/3ML) 0.083% nebulizer solution Take 2.5 mg by nebulization every 6 (six) hours as needed for shortness of breath. Use four times a day as needed for shortness of breath or wheezing    . amiodarone (PACERONE) 200 MG tablet Take 1 tablet (200 mg total) by mouth daily. 30 tablet 6  . amitriptyline (ELAVIL) 25 MG tablet Take 25 mg by mouth at bedtime.      Marland Kitchen atorvastatin (LIPITOR) 40 MG tablet TAKE ONE TABLET BY MOUTH ONCE DAILY 90 tablet 3  . CARTIA XT 180 MG 24 hr capsule TAKE ONE CAPSULE BY MOUTH ONCE DAILY 30 capsule 3  . dicyclomine (BENTYL) 10 MG capsule Take 10 mg by mouth daily.     Marland Kitchen ELIQUIS 5 MG TABS tablet TAKE ONE TABLET BY MOUTH TWICE DAILY 60 tablet 6  . esomeprazole (NEXIUM) 40 MG capsule TAKE 1 CAPSULE BY MOUTH TWICE DAILY BEFORE  MEAL(S) 60 capsule 1  . hydrALAZINE (APRESOLINE) 25 MG tablet TAKE 1 TABLET BY MOUTH THREE TIMES DAILY 90 tablet 3  . insulin glargine (LANTUS) 100 UNIT/ML injection Inject 60 Units into the skin every morning.     . levalbuterol (XOPENEX) 0.31 MG/3ML nebulizer solution Take 1 ampule by nebulization every 4 (four) hours as needed for wheezing.    Marland Kitchen levothyroxine (SYNTHROID, LEVOTHROID) 25 MCG tablet Take 1 tablet by mouth daily.    Marland Kitchen LINZESS 290 MCG CAPS  capsule     . metolazone (ZAROXOLYN) 2.5 MG tablet Take 1 tablet (2.5 mg total) by mouth 2 (two) times a week. Tuesday and Saturday 10 tablet 6  . metoprolol tartrate (LOPRESSOR) 25 MG tablet Take 3 tablets (75 mg total) by mouth 2 (two) times daily. 180 tablet 6  . nitroGLYCERIN (NITROSTAT) 0.4 MG SL tablet Place 0.4 mg under the tongue every 5 (five) minutes as needed for chest pain (x 3 doses). Reported on 02/25/2016    . NOVOLOG FLEXPEN 100 UNIT/ML FlexPen     . omeprazole (PRILOSEC) 40 MG capsule Take 1 capsule (40 mg total) by mouth 2 (two) times daily before a meal. 120 capsule 3  . OXYGEN Inhale 3 L/min into the lungs continuous. Patient uses nightly    . potassium chloride SA (K-DUR,KLOR-CON) 20 MEQ tablet Take 40 mEq by mouth QID.    Marland Kitchen ranitidine (ZANTAC) 150 MG tablet Take 150 mg by mouth as needed for heartburn.    Marland Kitchen RELION PEN NEEDLES 32G X 4 MM MISC     . torsemide (DEMADEX) 100 MG tablet Take 100 mg by mouth 2 (two) times daily.    Marland Kitchen ULORIC 80 MG TABS Take 1 tablet by mouth daily.     . Vitamin D, Ergocalciferol, (DRISDOL) 50000 UNITS CAPS capsule Take 50,000 Units by mouth every Monday.    Penne Lash HFA 45 MCG/ACT inhaler Inhale 2 puffs into the lungs every 4 (four) hours as needed for wheezing or shortness of breath. 1 Inhaler 1  . zolpidem (AMBIEN) 10 MG tablet Take 10 mg by mouth at bedtime.      No current facility-administered medications for this encounter.     Vitals:   05/04/17 1106  BP: (!) 141/76  Pulse: 70  SpO2:  97%  Weight: 253 lb (114.8 kg)    PHYSICAL EXAM: General: Obese, NAD, in wheelchair. Daughter present HEENT: normal  Neck: Thick, no JVD; Carotids 2+ bilat; no bruits. No lymphadenopathy or thryomegaly  Cor: PMI nonpalpable. Distant. Irregular rate. 1/6 SEM RUSB.  Lungs: Mild bibasilar crackles.   Abdomen: Obese, soft, nontender. mild-distended. No hepatosplenomegaly. No bruits or masses. +BS Extremities: no cyanosis, clubbing, rash, warm. 1+ ankle edema.   Neuro: alert & orientedx3, cranial nerves grossly intact. moves all 4 extremities w/o difficulty. Affect pleasant   ASSESSMENT & PLAN:  1) Chronic diastolic HF: Echo 4/33 with EF 55-60% with moderate LVH and normal RV. NYHA class III symptoms.  However, she does not appear particularly volume overloaded.  Dyspnea likely has a prominent pulmonary component with restrictive PFTs from body habitus and interstitial lung disease (NSIP pattern by high resolution CT).  She is on torsemide 100 mg bid with metolazone twice a week.  She feels like she is breathing better s/p DCCV.  - Continue torsemide 100 mg BID.   - Continue metolazone twice a week.   - BMET today.  2) Atrial fibrillation:  She remains out of atrial fibrillation (a-paced on ECG today).  - Remains on amiodarone. Will need regular eye exams. Recent TSH and LFTS stable.  - Continue Eliquis for now. Warfarin may be safer with her degree of renal dysfunction, can transition a couple of months after DCCV.  3) CKD stage IV: BMET today. Most recent creatine 4.27. Sees nephrology.  She is very reluctant to undergo HD.  4) Tachy/Brady syndrome: s/p St Jude PPM.  Stable.  5) OSA: Encouraged nightly CPAP use 6) Interstitial lung disease: Has ILD, may be RA-related.  Has seen Dr Amil Amen and follows with Dr Halford Chessman for pulmonology.   - Continue home O2.    7) Hyperlipidemia: Relatively stable 03/21/17. TGs high.    Shirley Friar, PA-C  05/04/2017    Patient seen with PA, agree with  the above note.  She had DCCV back to NSR and remains out of atrial fibrillation today.  She feels like her breathing is better in NSR.  I will have her continue amiodarone.  She will continue current diuretic regimen, volume looks ok. BMET today.   [name] 05/06/2017

## 2017-05-04 NOTE — Patient Instructions (Signed)
Labs drawn today  Your physician recommends that you schedule a follow-up appointment in: 2 months

## 2017-05-14 ENCOUNTER — Other Ambulatory Visit (HOSPITAL_COMMUNITY): Payer: Self-pay | Admitting: Cardiology

## 2017-05-21 DIAGNOSIS — R0602 Shortness of breath: Secondary | ICD-10-CM | POA: Diagnosis not present

## 2017-05-28 DIAGNOSIS — I509 Heart failure, unspecified: Secondary | ICD-10-CM | POA: Diagnosis not present

## 2017-06-07 ENCOUNTER — Ambulatory Visit (INDEPENDENT_AMBULATORY_CARE_PROVIDER_SITE_OTHER): Payer: PPO | Admitting: *Deleted

## 2017-06-07 DIAGNOSIS — I495 Sick sinus syndrome: Secondary | ICD-10-CM

## 2017-06-07 NOTE — Progress Notes (Signed)
Remote pacemaker transmission.   

## 2017-06-13 LAB — CUP PACEART REMOTE DEVICE CHECK
Battery Remaining Longevity: 109 mo
Battery Remaining Percentage: 95.5 %
Battery Voltage: 2.99 V
Brady Statistic AP VP Percent: 2.6 %
Brady Statistic AP VS Percent: 97 %
Brady Statistic AS VP Percent: 1 %
Brady Statistic AS VS Percent: 1 %
Brady Statistic RA Percent Paced: 97 %
Brady Statistic RV Percent Paced: 2.6 %
Date Time Interrogation Session: 20181004060035
Implantable Lead Implant Date: 20151015
Implantable Lead Implant Date: 20151015
Implantable Lead Location: 753859
Implantable Lead Location: 753860
Implantable Lead Model: 1948
Implantable Pulse Generator Implant Date: 20151015
Lead Channel Impedance Value: 490 Ohm
Lead Channel Impedance Value: 600 Ohm
Lead Channel Pacing Threshold Amplitude: 0.75 V
Lead Channel Pacing Threshold Amplitude: 1.25 V
Lead Channel Pacing Threshold Pulse Width: 0.5 ms
Lead Channel Pacing Threshold Pulse Width: 0.5 ms
Lead Channel Sensing Intrinsic Amplitude: 12 mV
Lead Channel Sensing Intrinsic Amplitude: 5 mV
Lead Channel Setting Pacing Amplitude: 2 V
Lead Channel Setting Pacing Amplitude: 2.5 V
Lead Channel Setting Pacing Pulse Width: 0.5 ms
Lead Channel Setting Sensing Sensitivity: 2 mV
Pulse Gen Model: 2240
Pulse Gen Serial Number: 7665001

## 2017-06-14 ENCOUNTER — Encounter: Payer: Self-pay | Admitting: Cardiology

## 2017-06-20 DIAGNOSIS — R0602 Shortness of breath: Secondary | ICD-10-CM | POA: Diagnosis not present

## 2017-06-22 DIAGNOSIS — I10 Essential (primary) hypertension: Secondary | ICD-10-CM | POA: Diagnosis not present

## 2017-06-22 DIAGNOSIS — E669 Obesity, unspecified: Secondary | ICD-10-CM | POA: Diagnosis not present

## 2017-06-22 DIAGNOSIS — I480 Paroxysmal atrial fibrillation: Secondary | ICD-10-CM | POA: Diagnosis not present

## 2017-06-22 DIAGNOSIS — N185 Chronic kidney disease, stage 5: Secondary | ICD-10-CM | POA: Diagnosis not present

## 2017-06-22 DIAGNOSIS — Z950 Presence of cardiac pacemaker: Secondary | ICD-10-CM | POA: Diagnosis not present

## 2017-06-27 ENCOUNTER — Other Ambulatory Visit (HOSPITAL_COMMUNITY): Payer: Self-pay | Admitting: Cardiology

## 2017-06-27 DIAGNOSIS — I509 Heart failure, unspecified: Secondary | ICD-10-CM | POA: Diagnosis not present

## 2017-06-29 ENCOUNTER — Encounter: Payer: Self-pay | Admitting: Cardiology

## 2017-07-02 ENCOUNTER — Telehealth (HOSPITAL_COMMUNITY): Payer: Self-pay | Admitting: *Deleted

## 2017-07-02 NOTE — Telephone Encounter (Signed)
Pt aware, appt sch for tomorrow 10/30 at 3:30, advised pt if cp get worse or does not ease up to report to ER, pt aware and agreeable

## 2017-07-02 NOTE — Telephone Encounter (Signed)
Advanced Heart Failure Triage Encounter  Patient Name: [name]  Date of Call: 07/02/17  Problem:  Pt c/o chest tightness and increased sob since Fri, but states it has gotten worse yesterday.  She reports at rest she feels ok but any little exertion causes the symptoms.  She states she has not weighed herself the past couple of days but prior to that wt was stable.  She states she taking her Torsemide 100 mg BID and Metolazone every Tuesday and Saturday and states she has not missed any doses.  She reports LE edema and decreased appetite, she denies nausea but states she just doesn't feel like eating anything.  Plan:  Will send to Dr Aundra Dubin for review and recommendations.  Kevan Rosebush, RN

## 2017-07-02 NOTE — Telephone Encounter (Signed)
Need to get her worked in.  Has CHF but also history of CAD so chest heaviness could be either.  Needs seen this week, either me or PA/NP.

## 2017-07-03 ENCOUNTER — Encounter (HOSPITAL_COMMUNITY): Payer: PPO

## 2017-07-03 ENCOUNTER — Ambulatory Visit (HOSPITAL_COMMUNITY)
Admission: RE | Admit: 2017-07-03 | Discharge: 2017-07-03 | Disposition: A | Discharge status: Home / Self Care | Payer: PPO | Source: Ambulatory Visit | Attending: Cardiology | Admitting: Cardiology

## 2017-07-03 VITALS — BP 132/96 | HR 70 | Wt 254.6 lb

## 2017-07-03 DIAGNOSIS — J849 Interstitial pulmonary disease, unspecified: Secondary | ICD-10-CM | POA: Insufficient documentation

## 2017-07-03 DIAGNOSIS — J45909 Unspecified asthma, uncomplicated: Secondary | ICD-10-CM | POA: Diagnosis not present

## 2017-07-03 DIAGNOSIS — Z8249 Family history of ischemic heart disease and other diseases of the circulatory system: Secondary | ICD-10-CM | POA: Diagnosis not present

## 2017-07-03 DIAGNOSIS — R079 Chest pain, unspecified: Secondary | ICD-10-CM

## 2017-07-03 DIAGNOSIS — E785 Hyperlipidemia, unspecified: Secondary | ICD-10-CM | POA: Insufficient documentation

## 2017-07-03 DIAGNOSIS — I481 Persistent atrial fibrillation: Secondary | ICD-10-CM | POA: Diagnosis not present

## 2017-07-03 DIAGNOSIS — E1151 Type 2 diabetes mellitus with diabetic peripheral angiopathy without gangrene: Secondary | ICD-10-CM | POA: Insufficient documentation

## 2017-07-03 DIAGNOSIS — Z8041 Family history of malignant neoplasm of ovary: Secondary | ICD-10-CM | POA: Insufficient documentation

## 2017-07-03 DIAGNOSIS — Z841 Family history of disorders of kidney and ureter: Secondary | ICD-10-CM | POA: Diagnosis not present

## 2017-07-03 DIAGNOSIS — I5022 Chronic systolic (congestive) heart failure: Secondary | ICD-10-CM

## 2017-07-03 DIAGNOSIS — J984 Other disorders of lung: Secondary | ICD-10-CM | POA: Diagnosis not present

## 2017-07-03 DIAGNOSIS — Z800 Family history of malignant neoplasm of digestive organs: Secondary | ICD-10-CM | POA: Diagnosis not present

## 2017-07-03 DIAGNOSIS — Z7901 Long term (current) use of anticoagulants: Secondary | ICD-10-CM | POA: Insufficient documentation

## 2017-07-03 DIAGNOSIS — Z9981 Dependence on supplemental oxygen: Secondary | ICD-10-CM | POA: Insufficient documentation

## 2017-07-03 DIAGNOSIS — G4733 Obstructive sleep apnea (adult) (pediatric): Secondary | ICD-10-CM | POA: Insufficient documentation

## 2017-07-03 DIAGNOSIS — I2510 Atherosclerotic heart disease of native coronary artery without angina pectoris: Secondary | ICD-10-CM

## 2017-07-03 DIAGNOSIS — R0789 Other chest pain: Secondary | ICD-10-CM | POA: Insufficient documentation

## 2017-07-03 DIAGNOSIS — Z87891 Personal history of nicotine dependence: Secondary | ICD-10-CM | POA: Diagnosis not present

## 2017-07-03 DIAGNOSIS — I495 Sick sinus syndrome: Secondary | ICD-10-CM | POA: Diagnosis not present

## 2017-07-03 DIAGNOSIS — Z8052 Family history of malignant neoplasm of bladder: Secondary | ICD-10-CM | POA: Insufficient documentation

## 2017-07-03 DIAGNOSIS — I5032 Chronic diastolic (congestive) heart failure: Secondary | ICD-10-CM | POA: Insufficient documentation

## 2017-07-03 DIAGNOSIS — M109 Gout, unspecified: Secondary | ICD-10-CM | POA: Insufficient documentation

## 2017-07-03 DIAGNOSIS — I130 Hypertensive heart and chronic kidney disease with heart failure and stage 1 through stage 4 chronic kidney disease, or unspecified chronic kidney disease: Secondary | ICD-10-CM | POA: Insufficient documentation

## 2017-07-03 DIAGNOSIS — N184 Chronic kidney disease, stage 4 (severe): Secondary | ICD-10-CM | POA: Diagnosis not present

## 2017-07-03 DIAGNOSIS — E1122 Type 2 diabetes mellitus with diabetic chronic kidney disease: Secondary | ICD-10-CM

## 2017-07-03 DIAGNOSIS — I4892 Unspecified atrial flutter: Secondary | ICD-10-CM | POA: Diagnosis not present

## 2017-07-03 DIAGNOSIS — N183 Chronic kidney disease, stage 3 (moderate): Secondary | ICD-10-CM | POA: Diagnosis not present

## 2017-07-03 DIAGNOSIS — I4891 Unspecified atrial fibrillation: Secondary | ICD-10-CM

## 2017-07-03 DIAGNOSIS — I252 Old myocardial infarction: Secondary | ICD-10-CM | POA: Insufficient documentation

## 2017-07-03 DIAGNOSIS — Z79899 Other long term (current) drug therapy: Secondary | ICD-10-CM | POA: Insufficient documentation

## 2017-07-03 DIAGNOSIS — Z66 Do not resuscitate: Secondary | ICD-10-CM | POA: Insufficient documentation

## 2017-07-03 DIAGNOSIS — Z794 Long term (current) use of insulin: Secondary | ICD-10-CM | POA: Insufficient documentation

## 2017-07-03 DIAGNOSIS — K219 Gastro-esophageal reflux disease without esophagitis: Secondary | ICD-10-CM | POA: Insufficient documentation

## 2017-07-03 LAB — BASIC METABOLIC PANEL
Anion gap: 11 (ref 5–15)
BUN: 33 mg/dL — ABNORMAL HIGH (ref 6–20)
CO2: 20 mmol/L — ABNORMAL LOW (ref 22–32)
Calcium: 9 mg/dL (ref 8.9–10.3)
Chloride: 108 mmol/L (ref 101–111)
Creatinine, Ser: 3.64 mg/dL — ABNORMAL HIGH (ref 0.44–1.00)
GFR calc Af Amer: 13 mL/min — ABNORMAL LOW (ref >60)
GFR calc non Af Amer: 12 mL/min — ABNORMAL LOW (ref >60)
Glucose, Bld: 183 mg/dL — ABNORMAL HIGH (ref 65–99)
Potassium: 4.1 mmol/L (ref 3.5–5.1)
Sodium: 139 mmol/L (ref 135–145)

## 2017-07-03 MED ORDER — ISOSORBIDE MONONITRATE ER 30 MG PO TB24: 30.0000 mg | ORAL_TABLET | Freq: Every day | ORAL | 3 refills | Status: DC

## 2017-07-03 NOTE — Progress Notes (Signed)
Patient ID: [name], female   DOB: 1946/06/18, 71 y.o.   MRN: [id number]   PCP: Dr. Alyson Ingles  Nephrologist: Dr. Florene Glen Cardiology: Dr Aundra Dubin  HPI: [name] is a 71 year-old woman with hypertension, hyperlipidemia, bronchial asthma, diabetes mellitus, CKD stage IV (baseline cr ~2.5), CAD, atrial fibrillation and diastolic HF.  Cath 5/12 with Promus DES to proximal LCx.  RCA was totally occluded with collaterals.   She was admitted in 4/15 for R TKR. That hospitalization complicated by a/c diastolic HF with respiratory distress requiring non-rebreather support. Weight on discharge was 226 pounds. ABG at that time 7.4/34/91/97%. Cr peaked at 2.9  Admitted 6/30-7/10/15 for SOB and CP following scheduled cardioversion. Found to have severe aspiration PNA and was intubated. She maintained SR for short period of time and then went back into Afib/Aflutter.  HR controlled on amio and diltiazem. Discharged to [facility] at a weight of 232 lbs.   Admitted 06/16/14 with symptomatic tachy/brady syndrome. 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 Discharge weight was 223 pounds.   Admitted 02/16/15 for symptomatic anemia s/p blood work from her pulmonologist.  Was diuresed with lasix while in hospital.   PFTs (6/16) with FVC 80%, FEV1 83%, ratio 103%, TLC 75%, DLCO 78% => restriction c/w interstitial process.  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   Holter (7/16) with rare PACs, PVCs; no atrial fibrillation.  After her CT showing interstitial fibrosis, she saw pulmonary. There was concern for connective tissue disease with lung involvement.  She has seen rheumatology and was told that she likely has rheumatoid arthritis.  The pulmonologist did not think that her ILD was due to amiodarone, so she is still on amiodarone.    Peripheral arterial dopplers in 6/18 showed 50-74% left mid SFA stenosis and 30-49%  right mid SFA stenosis.    Echo 03/21/17 EF 55-60% with moderate LVH, normal RV size and systolic function, mild MR.    S/p DCCV 03/30/17 with return to NSR.   Returns today for HF follow up. She called our office yesterday with complaints of chest tightness and SOB. She tells me that her chest pain is associated with jaw and tooth pain which is reminiscent of her prior MI. Chest pain has been intermittent since Saturday. Associated with SOB. Today she has not had any chest pain. Taking all medications. Weight is stable 255-256 pounds. Denies orthopnea and PND.    ECG 07/03/17: atrial paced with inferolateral T wave inversion consistent with prior EKG's.   Labs: 11/16: K 3.5, creatinine 3.4, HCT 45.1 2/17: K 3, creatinine 3.22, LFTs normal, TSH normal, HCT 46.9 7/17: K 3.6, creatinine 3.6, HCT 43.3, LFTs normal, TSH normal 5/18: K 4.5, creatinine 4.07, TSH normal, LFTs normal, hgb 14.6 7/18: K 4.2, creatinine 4.27, LDL 52, TSH normal  Review of systems complete and found to be negative unless listed in HPI.    SH:  Social History   Social History  . Marital status: Widowed    Spouse name: N/A  . Number of children: 3  . Years of education: N/A   Occupational History  . Disabled    Social History Main Topics  . Smoking status: Former Smoker    Packs/day: 0.50    Years: 35.00    Types: Cigarettes    Quit date: 02/16/1995  . Smokeless tobacco: Never Used     Comment: 05/2014  QUIT OVER 20 YEARS AGO "  .  Alcohol use No  . Drug use: No  . Sexual activity: Not Currently   Other Topics Concern  . Not on file   Social History Narrative  . No narrative on file    FH:  Family History  Problem Relation Age of Onset  . Heart disease Mother   . Bladder Cancer Mother   . Kidney disease Mother   . Ovarian cancer Daughter   . Stomach cancer Maternal Uncle   . Colon cancer Maternal Aunt        dx in her 49's  . Esophageal cancer Neg Hx     Past Medical History:  Diagnosis  Date  . Anginal pain ([place])    occ; non-ischemic Lexiscan 09/2012  . Arthritis   . Asthma   . Atrial flutter ([place])    ablated by Dr Lovena Le in 2008  . CKD (chronic kidney disease) 04/2007   CKD stage 4(Dr. Erling Cruz)  . Complication of anesthesia     DIFFICULTY BREATHING   . Coronary atherosclerosis of native coronary artery   . Diastolic heart failure 05/7352   grade 2 diastolic dysfunction per 10/9922 echo  . DNI (do not intubate)   . DNR (do not resuscitate)   . Esophageal dysmotility 2008   noted on esophagram.  hx dysphagia.   . Fatty liver 2008   noted on ultrasound 2008  . Fatty tumor fatty tumor back  . GERD (gastroesophageal reflux disease) 2012   Barrets esophagus on bx 2012 and 2014.   Marland Kitchen Gouty arthropathy   . Heart murmur   . Hyperlipidemia   . Hypertension   . 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   . Morbid obesity ([place])   . Myocardial infarction ([place]) 2009  . Peripheral vascular disease ([place])   . Persistent atrial fibrillation (H[facility])    chads2 vasc score of at least 5  . Sick sinus syndrome ([place])   . Sleep apnea    wears CPAP   Current Outpatient Prescriptions  Medication Sig Dispense Refill  . amiodarone (PACERONE) 200 MG tablet Take 1 tablet (200 mg total) by mouth daily. 30 tablet 6  . amitriptyline (ELAVIL) 25 MG tablet Take 25 mg by mouth at bedtime.      Marland Kitchen atorvastatin (LIPITOR) 40 MG tablet TAKE ONE TABLET BY MOUTH ONCE DAILY 90 tablet 3  . CARTIA XT 180 MG 24 hr capsule TAKE ONE CAPSULE BY MOUTH ONCE DAILY 30 capsule 3  . dicyclomine (BENTYL) 10 MG capsule Take 10 mg by mouth daily.     Marland Kitchen ELIQUIS 5 MG TABS tablet TAKE ONE TABLET BY MOUTH TWICE DAILY 60 tablet 6  . esomeprazole (NEXIUM) 40 MG capsule TAKE 1 CAPSULE BY MOUTH TWICE DAILY BEFORE MEAL(S) 60 capsule 1  . hydrALAZINE (APRESOLINE) 25 MG tablet TAKE 1 TABLET BY MOUTH THREE TIMES DAILY 90 tablet 3  . insulin glargine (LANTUS) 100 UNIT/ML injection Inject 60 Units into the  skin every morning.     . levalbuterol (XOPENEX) 0.31 MG/3ML nebulizer solution Take 1 ampule by nebulization every 4 (four) hours as needed for wheezing.    Marland Kitchen levothyroxine (SYNTHROID, LEVOTHROID) 25 MCG tablet Take 1 tablet by mouth daily.    Marland Kitchen LINZESS 290 MCG CAPS capsule     . metolazone (ZAROXOLYN) 2.5 MG tablet Take 1 tablet (2.5 mg total) by mouth 2 (two) times a week. Tuesday and Saturday 10 tablet 6  . metoprolol tartrate (LOPRESSOR) 25 MG tablet Take  3 tablets (75 mg total) by mouth 2 (two) times daily. 180 tablet 6  . nitroGLYCERIN (NITROSTAT) 0.4 MG SL tablet Place 0.4 mg under the tongue every 5 (five) minutes as needed for chest pain (x 3 doses). Reported on 02/25/2016    . NOVOLOG FLEXPEN 100 UNIT/ML FlexPen     . omeprazole (PRILOSEC) 40 MG capsule Take 1 capsule (40 mg total) by mouth 2 (two) times daily before a meal. 120 capsule 3  . OXYGEN Inhale 3 L/min into the lungs continuous. Patient uses nightly    . potassium chloride SA (K-DUR,KLOR-CON) 20 MEQ tablet Take 40 mEq by mouth QID.    Marland Kitchen ranitidine (ZANTAC) 150 MG tablet Take 150 mg by mouth as needed for heartburn.    Marland Kitchen RELION PEN NEEDLES 32G X 4 MM MISC     . torsemide (DEMADEX) 100 MG tablet Take 100 mg by mouth 2 (two) times daily.    Marland Kitchen ULORIC 80 MG TABS Take 1 tablet by mouth daily.     . Vitamin D, Ergocalciferol, (DRISDOL) 50000 UNITS CAPS capsule Take 50,000 Units by mouth every Monday.    Penne Lash HFA 45 MCG/ACT inhaler Inhale 2 puffs into the lungs every 4 (four) hours as needed for wheezing or shortness of breath. 1 Inhaler 1  . zolpidem (AMBIEN) 10 MG tablet Take 10 mg by mouth at bedtime.     Marland Kitchen albuterol (PROVENTIL) (2.5 MG/3ML) 0.083% nebulizer solution Take 2.5 mg by nebulization every 6 (six) hours as needed for shortness of breath. Use four times a day as needed for shortness of breath or wheezing     No current facility-administered medications for this encounter.     Vitals:   07/03/17 1159  BP: (!)  132/96  Pulse: 70  SpO2: 94%  Weight: 254 lb 9.6 oz (115.5 kg)    PHYSICAL EXAM:  General: Chronically ill appearing. Arrived in wheelchair.  HEENT: Normal Neck: Supple. JVP 6-7 cm. Carotids 2+ bilat; no bruits. No thyromegaly or nodule noted. Cor: PMI nondisplaced. RRR, 1/6 SEM.  Lungs: Diminished in bases. Wearing oxygen.  Abdomen: Obese, soft, non-tender, non-distended, no HSM. No bruits or masses. +BS  Extremities: No cyanosis, clubbing, or rash. R and LLE no edema.  Neuro: Alert & orientedx3, cranial nerves grossly intact. moves all 4 extremities w/o difficulty. Affect pleasant   ASSESSMENT & PLAN:  1) Chronic diastolic HF: Echo 4/09 with EF 55-60% with moderate LVH and normal RV. - NYHA III, per Dr. Claris Gladden last office note: dyspnea likely has a pulmonary component with restrictive PFTs from body habitus and ILD. (NSIP pattern on high res chest CT).  - Continue torsemide 100 mg BID.   - Continue metolazone twice a week.  - BMET today.   2) Atrial fibrillation: - A paced.  - Continue amiodarone.  - Continue Eliquis for anticoagulation   3) CKD stage IV:  - BMET today - Will likely need to switch to warfarin with degree of renal impairment.   4) Tachy/Brady syndrome: s/p St Jude PPM.   - Stable.    5) OSA:  - Encouraged nightly CPAP   6) Interstitial lung disease: Has ILD, may be RA-related.  Has seen Dr Amil Amen and follows with Dr Halford Chessman for pulmonology.   - Continue home oxygen.   7) Hyperlipidemia:  - Continue lipitor.   8) Chest pain - With history of CAD. Could be ACS, but with degree of CKD, cath would likely cause her to need HD. EKG  non ischemic and consistent with prior EKG's. Will add isosorbide 30 mg daily.      Arbutus Leas, NP  07/03/2017

## 2017-07-03 NOTE — Patient Instructions (Signed)
START Isosorbide (Imdur) 30 mg tablet once daily.  Routine lab work today. Will notify you of abnormal results, otherwise no news is good news!  Follow up as scheduled with Dr. Aundra Dubin. See attached sheet for appointment details.  Take all medication as prescribed the day of your appointment. Bring all medications with you to your appointment.  Do the following things EVERYDAY: 1) Weigh yourself in the morning before breakfast. Write it down and keep it in a log. 2) Take your medicines as prescribed 3) Eat low salt foods-Limit salt (sodium) to 2000 mg per day.  4) Stay as active as you can everyday 5) Limit all fluids for the day to less than 2 liters

## 2017-07-10 ENCOUNTER — Other Ambulatory Visit (HOSPITAL_COMMUNITY): Payer: Self-pay | Admitting: Cardiology

## 2017-07-10 DIAGNOSIS — I5022 Chronic systolic (congestive) heart failure: Secondary | ICD-10-CM

## 2017-07-13 ENCOUNTER — Ambulatory Visit (HOSPITAL_COMMUNITY)
Admission: RE | Admit: 2017-07-13 | Discharge: 2017-07-13 | Disposition: A | Discharge status: Home / Self Care | Payer: PPO | Source: Ambulatory Visit | Attending: Cardiology | Admitting: Cardiology

## 2017-07-13 ENCOUNTER — Other Ambulatory Visit (HOSPITAL_COMMUNITY): Payer: Self-pay | Admitting: *Deleted

## 2017-07-13 VITALS — BP 148/87 | HR 70 | Wt 245.2 lb

## 2017-07-13 DIAGNOSIS — I130 Hypertensive heart and chronic kidney disease with heart failure and stage 1 through stage 4 chronic kidney disease, or unspecified chronic kidney disease: Secondary | ICD-10-CM | POA: Insufficient documentation

## 2017-07-13 DIAGNOSIS — Z8052 Family history of malignant neoplasm of bladder: Secondary | ICD-10-CM | POA: Diagnosis not present

## 2017-07-13 DIAGNOSIS — Z794 Long term (current) use of insulin: Secondary | ICD-10-CM | POA: Diagnosis not present

## 2017-07-13 DIAGNOSIS — I5032 Chronic diastolic (congestive) heart failure: Secondary | ICD-10-CM | POA: Diagnosis not present

## 2017-07-13 DIAGNOSIS — J849 Interstitial pulmonary disease, unspecified: Secondary | ICD-10-CM | POA: Diagnosis not present

## 2017-07-13 DIAGNOSIS — Z9981 Dependence on supplemental oxygen: Secondary | ICD-10-CM | POA: Insufficient documentation

## 2017-07-13 DIAGNOSIS — Z79899 Other long term (current) drug therapy: Secondary | ICD-10-CM | POA: Insufficient documentation

## 2017-07-13 DIAGNOSIS — E1122 Type 2 diabetes mellitus with diabetic chronic kidney disease: Secondary | ICD-10-CM | POA: Insufficient documentation

## 2017-07-13 DIAGNOSIS — I5022 Chronic systolic (congestive) heart failure: Secondary | ICD-10-CM

## 2017-07-13 DIAGNOSIS — I481 Persistent atrial fibrillation: Secondary | ICD-10-CM | POA: Insufficient documentation

## 2017-07-13 DIAGNOSIS — Z7901 Long term (current) use of anticoagulants: Secondary | ICD-10-CM | POA: Insufficient documentation

## 2017-07-13 DIAGNOSIS — E785 Hyperlipidemia, unspecified: Secondary | ICD-10-CM | POA: Diagnosis not present

## 2017-07-13 DIAGNOSIS — Z8041 Family history of malignant neoplasm of ovary: Secondary | ICD-10-CM | POA: Diagnosis not present

## 2017-07-13 DIAGNOSIS — N184 Chronic kidney disease, stage 4 (severe): Secondary | ICD-10-CM | POA: Insufficient documentation

## 2017-07-13 DIAGNOSIS — Z87891 Personal history of nicotine dependence: Secondary | ICD-10-CM | POA: Insufficient documentation

## 2017-07-13 DIAGNOSIS — I2510 Atherosclerotic heart disease of native coronary artery without angina pectoris: Secondary | ICD-10-CM | POA: Insufficient documentation

## 2017-07-13 DIAGNOSIS — R7989 Other specified abnormal findings of blood chemistry: Secondary | ICD-10-CM

## 2017-07-13 DIAGNOSIS — Z808 Family history of malignant neoplasm of other organs or systems: Secondary | ICD-10-CM | POA: Insufficient documentation

## 2017-07-13 DIAGNOSIS — S025XXA Fracture of tooth (traumatic), initial encounter for closed fracture: Secondary | ICD-10-CM

## 2017-07-13 DIAGNOSIS — Z8249 Family history of ischemic heart disease and other diseases of the circulatory system: Secondary | ICD-10-CM | POA: Diagnosis not present

## 2017-07-13 DIAGNOSIS — G4733 Obstructive sleep apnea (adult) (pediatric): Secondary | ICD-10-CM | POA: Insufficient documentation

## 2017-07-13 LAB — COMPREHENSIVE METABOLIC PANEL
ALT: 11 U/L — ABNORMAL LOW (ref 14–54)
AST: 20 U/L (ref 15–41)
Albumin: 3.7 g/dL (ref 3.5–5.0)
Alkaline Phosphatase: 64 U/L (ref 38–126)
Anion gap: 13 (ref 5–15)
BUN: 49 mg/dL — ABNORMAL HIGH (ref 6–20)
CO2: 22 mmol/L (ref 22–32)
Calcium: 9.4 mg/dL (ref 8.9–10.3)
Chloride: 104 mmol/L (ref 101–111)
Creatinine, Ser: 4.38 mg/dL — ABNORMAL HIGH (ref 0.44–1.00)
GFR calc Af Amer: 11 mL/min — ABNORMAL LOW (ref >60)
GFR calc non Af Amer: 9 mL/min — ABNORMAL LOW (ref >60)
Glucose, Bld: 175 mg/dL — ABNORMAL HIGH (ref 65–99)
Potassium: 4 mmol/L (ref 3.5–5.1)
Sodium: 139 mmol/L (ref 135–145)
Total Bilirubin: 0.5 mg/dL (ref 0.3–1.2)
Total Protein: 7.1 g/dL (ref 6.5–8.1)

## 2017-07-13 LAB — CBC
HCT: 43 % (ref 36.0–46.0)
Hemoglobin: 14.3 g/dL (ref 12.0–15.0)
MCH: 33.3 pg (ref 26.0–34.0)
MCHC: 33.3 g/dL (ref 30.0–36.0)
MCV: 100 fL (ref 78.0–100.0)
Platelets: 199 10*3/uL (ref 150–400)
RBC: 4.3 MIL/uL (ref 3.87–5.11)
RDW: 14.9 % (ref 11.5–15.5)
WBC: 6.4 10*3/uL (ref 4.0–10.5)

## 2017-07-13 LAB — TSH: TSH: 4.05 u[IU]/mL (ref 0.350–4.500)

## 2017-07-13 MED ORDER — DILTIAZEM HCL ER COATED BEADS 180 MG PO CP24: 180.0000 mg | ORAL_CAPSULE | Freq: Every day | ORAL | 3 refills | Status: DC

## 2017-07-13 MED ORDER — HYDRALAZINE HCL 25 MG PO TABS: 25.0000 mg | ORAL_TABLET | Freq: Three times a day (TID) | ORAL | 3 refills | Status: DC

## 2017-07-13 NOTE — Patient Instructions (Signed)
Labs drawn today (if we do not call you, then your lab work was stable)   Wear compression stocking in the day (prescription given today)  You have been referred to Dr. Drexel Iha office for broken tooth  You have been referred to Home Physical Therapy (they will call you)   Your physician recommends that you schedule a follow-up appointment in: 2 months with Dr. Aundra Dubin

## 2017-07-15 NOTE — Progress Notes (Signed)
Patient ID: [name], female   DOB: [birth year], 71 y.o.   MRN: [id number]   PCP: Dr. Alyson Ingles  Nephrologist: Dr. Florene Glen Cardiology: Dr Aundra Dubin  HPI: [name] is a 71 year-old woman with hypertension, hyperlipidemia, bronchial asthma, diabetes mellitus, CKD stage IV (baseline cr ~2.5), CAD, atrial fibrillation and diastolic HF.  Cath 5/12 with Promus DES to proximal LCx.  RCA was totally occluded with collaterals.   She was admitted in 4/15 for R TKR. That hospitalization complicated by a/c diastolic HF with respiratory distress requiring non-rebreather support. Weight on discharge was 226 pounds. ABG at that time 7.4/34/91/97%. Cr peaked at 2.9  Admitted 6/30-7/10/15 for SOB and CP following scheduled cardioversion. Found to have severe aspiration PNA and was intubated. She maintained SR for short period of time and then went back into Afib/Aflutter.  HR controlled on amio and diltiazem. Discharged to [facility] at a weight of 232 lbs.   Admitted 06/16/14 with symptomatic tachy/brady syndrome. 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 Discharge weight was 223 pounds.   Admitted 02/16/15 for symptomatic anemia s/p blood work from her pulmonologist.  Was diuresed with lasix while in hospital.   PFTs (6/16) with FVC 80%, FEV1 83%, ratio 103%, TLC 75%, DLCO 78% => restriction c/w interstitial process.  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   Holter (7/16) with rare PACs, PVCs; no atrial fibrillation.  After her CT showing interstitial fibrosis, she saw pulmonary. There was concern for connective tissue disease with lung involvement.  She has seen rheumatology and was told that she likely has rheumatoid arthritis.  The pulmonologist did not think that her ILD was due to amiodarone, so she is still on amiodarone.    Peripheral arterial dopplers in 6/18 showed 50-74% left mid SFA stenosis and 30-49%  right mid SFA stenosis.    Echo 03/21/17 EF 55-60% with moderate LVH, normal RV size and systolic function, mild MR.    S/p DCCV 03/30/17 with return to NSR.   She is seen today in followup of diastolic CHF and paroxysmal atrial fibrillation.  She remains out of atrial fibrillation (a-paced).  No chest pain since starting Imdur at last appointment.  She has bilateral leg pain but not clearly associated with exertion.  No pedal ulcerations. No dyspnea walking around her house.  Short of breath with stairs, inclines.  Uses walker when she goes outside the house. Weight is down 9 lbs.     ECG (personally reviewed): a-paced, anterolateral T wave inversions.    Device interrogation: 3.9% v-paced, 97% a-paced, 1% atrial fibrillation  Labs: 11/16: K 3.5, creatinine 3.4, HCT 45.1 2/17: K 3, creatinine 3.22, LFTs normal, TSH normal, HCT 46.9 7/17: K 3.6, creatinine 3.6, HCT 43.3, LFTs normal, TSH normal 5/18: K 4.5, creatinine 4.07, TSH normal, LFTs normal, hgb 14.6 7/18: K 4.2, creatinine 4.27, LDL 52, TSH normal 10/18: K 4.1, creatinine 3.64  Review of systems complete and found to be negative unless listed in HPI.    SH:  Social History   Socioeconomic History  . Marital status: Widowed    Spouse name: Not on file  . Number of children: 3  . Years of education: Not on file  . Highest education level: Not on file  Social Needs  . Financial resource strain: Not on file  . Food insecurity - worry: Not on file  . Food insecurity - inability: Not on file  . Transportation  needs - medical: Not on file  . Transportation needs - non-medical: Not on file  Occupational History  . Occupation: Disabled  Tobacco Use  . Smoking status: Former Smoker    Packs/day: 0.50    Years: 35.00    Pack years: 17.50    Types: Cigarettes    Last attempt to quit: 02/16/1995    Years since quitting: 22.4  . Smokeless tobacco: Never Used  . Tobacco comment: 05/2014  QUIT OVER 20 YEARS AGO "  Substance and  Sexual Activity  . Alcohol use: No    Alcohol/week: 0.0 oz  . Drug use: No  . Sexual activity: Not Currently  Other Topics Concern  . Not on file  Social History Narrative  . Not on file    FH:  Family History  Problem Relation Age of Onset  . Heart disease Mother   . Bladder Cancer Mother   . Kidney disease Mother   . Ovarian cancer Daughter   . Stomach cancer Maternal Uncle   . Colon cancer Maternal Aunt        dx in her 74's  . Esophageal cancer Neg Hx     Past Medical History:  Diagnosis Date  . Anginal pain ([place])    occ; non-ischemic Lexiscan 09/2012  . Arthritis   . Asthma   . Atrial flutter ([place])    ablated by Dr Lovena Le in 2008  . CKD (chronic kidney disease) 04/2007   CKD stage 4(Dr. Erling Cruz)  . Complication of anesthesia     DIFFICULTY BREATHING   . Coronary atherosclerosis of native coronary artery   . Diastolic heart failure 04/2504   grade 2 diastolic dysfunction per 11/9765 echo  . DNI (do not intubate)   . DNR (do not resuscitate)   . Esophageal dysmotility 2008   noted on esophagram.  hx dysphagia.   . Fatty liver 2008   noted on ultrasound 2008  . Fatty tumor fatty tumor back  . GERD (gastroesophageal reflux disease) 2012   Barrets esophagus on bx 2012 and 2014.   Marland Kitchen Gouty arthropathy   . Heart murmur   . Hyperlipidemia   . Hypertension   . 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   . Morbid obesity ([place])   . Myocardial infarction ([employer]) 2009  . Peripheral vascular disease ([place])   . Persistent atrial fibrillation ([facility])    chads2 vasc score of at least 5  . Sick sinus syndrome ([place])   . Sleep apnea    wears CPAP   Current Outpatient Medications  Medication Sig Dispense Refill  . albuterol (PROVENTIL) (2.5 MG/3ML) 0.083% nebulizer solution Take 2.5 mg by nebulization every 6 (six) hours as needed for shortness of breath. Use four times a day as needed for shortness of breath or wheezing    . amiodarone (PACERONE) 200 MG  tablet Take 1 tablet (200 mg total) by mouth daily. 30 tablet 6  . amitriptyline (ELAVIL) 25 MG tablet Take 25 mg by mouth at bedtime.      Marland Kitchen atorvastatin (LIPITOR) 40 MG tablet TAKE ONE TABLET BY MOUTH ONCE DAILY 90 tablet 3  . dicyclomine (BENTYL) 10 MG capsule Take 10 mg by mouth daily.     Marland Kitchen diltiazem (CARTIA XT) 180 MG 24 hr capsule Take 1 capsule (180 mg total) daily by mouth. 30 capsule 3  . ELIQUIS 5 MG TABS tablet TAKE ONE TABLET BY MOUTH TWICE DAILY 60 tablet 6  . esomeprazole (  NEXIUM) 40 MG capsule TAKE 1 CAPSULE BY MOUTH TWICE DAILY BEFORE MEAL(S) 60 capsule 1  . hydrALAZINE (APRESOLINE) 25 MG tablet Take 1 tablet (25 mg total) 3 (three) times daily by mouth. 90 tablet 3  . insulin glargine (LANTUS) 100 UNIT/ML injection Inject 60 Units into the skin every morning.     . isosorbide mononitrate (IMDUR) 30 MG 24 hr tablet Take 1 tablet (30 mg total) by mouth daily. 90 tablet 3  . levalbuterol (XOPENEX) 0.31 MG/3ML nebulizer solution Take 1 ampule by nebulization every 4 (four) hours as needed for wheezing.    Marland Kitchen levothyroxine (SYNTHROID, LEVOTHROID) 25 MCG tablet Take 1 tablet by mouth daily.    Marland Kitchen LINZESS 290 MCG CAPS capsule     . metolazone (ZAROXOLYN) 2.5 MG tablet Take 1 tablet (2.5 mg total) by mouth 2 (two) times a week. Tuesday and Saturday 10 tablet 6  . metoprolol tartrate (LOPRESSOR) 25 MG tablet Take 3 tablets (75 mg total) by mouth 2 (two) times daily. 180 tablet 6  . nitroGLYCERIN (NITROSTAT) 0.4 MG SL tablet Place 0.4 mg under the tongue every 5 (five) minutes as needed for chest pain (x 3 doses). Reported on 02/25/2016    . NOVOLOG FLEXPEN 100 UNIT/ML FlexPen     . omeprazole (PRILOSEC) 40 MG capsule Take 1 capsule (40 mg total) by mouth 2 (two) times daily before a meal. 120 capsule 3  . OXYGEN Inhale 3 L/min into the lungs continuous. Patient uses nightly    . potassium chloride SA (K-DUR,KLOR-CON) 20 MEQ tablet Take 40 mEq by mouth QID.    Marland Kitchen ranitidine (ZANTAC) 150 MG  tablet Take 150 mg by mouth as needed for heartburn.    Marland Kitchen RELION PEN NEEDLES 32G X 4 MM MISC     . torsemide (DEMADEX) 100 MG tablet Take 100 mg by mouth 2 (two) times daily.    Marland Kitchen ULORIC 80 MG TABS Take 1 tablet by mouth daily.     . Vitamin D, Ergocalciferol, (DRISDOL) 50000 UNITS CAPS capsule Take 50,000 Units by mouth every Monday.    Penne Lash HFA 45 MCG/ACT inhaler Inhale 2 puffs into the lungs every 4 (four) hours as needed for wheezing or shortness of breath. 1 Inhaler 1  . zolpidem (AMBIEN) 10 MG tablet Take 10 mg by mouth at bedtime.      No current facility-administered medications for this encounter.     Vitals:   07/13/17 1120  BP: (!) 148/87  Pulse: 70  SpO2: 94%  Weight: 245 lb 3.2 oz (111.2 kg)    PHYSICAL EXAM: General: NAD Neck: No JVD, no thyromegaly or thyroid nodule.  Lungs: Slight crackles at bases bilaterally CV: Nondisplaced PMI.  Heart regular S1/S2, no S3/S4, no murmur.  1+ ankle edema.  No carotid bruit.  Normal pedal pulses.  Abdomen: Soft, nontender, no hepatosplenomegaly, no distention.  Skin: Intact without lesions or rashes.  Neurologic: Alert and oriented x 3.  Psych: Normal affect. Extremities: No clubbing or cyanosis.  HEENT: Normal.    ASSESSMENT & PLAN:  1) Chronic diastolic HF: Echo 2/70 with EF 55-60% with moderate LVH and normal RV.  NYHA III, dyspnea likely has a pulmonary component with restrictive PFTs from body habitus and ILD (NSIP pattern on high res chest CT).  She does not look volume overloaded on exam today.  - Continue torsemide 100 mg BID with K replacement.   - Continue metolazone twice a week.  - BMET today.  - I  will give her a prescription for compression stockings.  2) Atrial fibrillation: A paced, device interrogation shows minimal atrial fibrillation.   - Continue amiodarone, check LFTs and TSH today and will need regular eye exam.  - Continue Eliquis for anticoagulation.  CBC today.  3) CKD stage IV: BMET today 4)  Tachy/Brady syndrome: s/p St Jude PPM.   - Stable.   5) OSA: Encouraged nightly CPAP  6) Interstitial lung disease: Has ILD, may be RA-related.  Has seen Dr Amil Amen and follows with Dr Halford Chessman for pulmonology.   - Continue home oxygen.  7) Hyperlipidemia: Continue Lipitor, good lipids in 7/18.  8) CAD: She is on Imdur now, no chest pain.   - Continue statin.  - Continue Imdur. - No ASA with Eliquis use.   I will arrange for home PT.   Followup in 2 months.   [name], MD  07/15/2017

## 2017-07-18 ENCOUNTER — Other Ambulatory Visit (HOSPITAL_COMMUNITY): Payer: Self-pay

## 2017-07-18 DIAGNOSIS — I5032 Chronic diastolic (congestive) heart failure: Secondary | ICD-10-CM

## 2017-07-18 DIAGNOSIS — I5022 Chronic systolic (congestive) heart failure: Secondary | ICD-10-CM

## 2017-07-21 DIAGNOSIS — R0602 Shortness of breath: Secondary | ICD-10-CM | POA: Diagnosis not present

## 2017-07-25 ENCOUNTER — Telehealth (HOSPITAL_COMMUNITY): Payer: Self-pay

## 2017-07-25 DIAGNOSIS — Z7951 Long term (current) use of inhaled steroids: Secondary | ICD-10-CM | POA: Diagnosis not present

## 2017-07-25 DIAGNOSIS — M1990 Unspecified osteoarthritis, unspecified site: Secondary | ICD-10-CM | POA: Diagnosis not present

## 2017-07-25 DIAGNOSIS — I25119 Atherosclerotic heart disease of native coronary artery with unspecified angina pectoris: Secondary | ICD-10-CM | POA: Diagnosis not present

## 2017-07-25 DIAGNOSIS — I5032 Chronic diastolic (congestive) heart failure: Secondary | ICD-10-CM | POA: Diagnosis not present

## 2017-07-25 DIAGNOSIS — G4733 Obstructive sleep apnea (adult) (pediatric): Secondary | ICD-10-CM | POA: Diagnosis not present

## 2017-07-25 DIAGNOSIS — Z7901 Long term (current) use of anticoagulants: Secondary | ICD-10-CM | POA: Diagnosis not present

## 2017-07-25 DIAGNOSIS — J45909 Unspecified asthma, uncomplicated: Secondary | ICD-10-CM | POA: Diagnosis not present

## 2017-07-25 DIAGNOSIS — Z9981 Dependence on supplemental oxygen: Secondary | ICD-10-CM | POA: Diagnosis not present

## 2017-07-25 DIAGNOSIS — E1122 Type 2 diabetes mellitus with diabetic chronic kidney disease: Secondary | ICD-10-CM | POA: Diagnosis not present

## 2017-07-25 DIAGNOSIS — K22719 Barrett's esophagus with dysplasia, unspecified: Secondary | ICD-10-CM | POA: Diagnosis not present

## 2017-07-25 DIAGNOSIS — Z794 Long term (current) use of insulin: Secondary | ICD-10-CM | POA: Diagnosis not present

## 2017-07-25 DIAGNOSIS — I130 Hypertensive heart and chronic kidney disease with heart failure and stage 1 through stage 4 chronic kidney disease, or unspecified chronic kidney disease: Secondary | ICD-10-CM | POA: Diagnosis not present

## 2017-07-25 DIAGNOSIS — N184 Chronic kidney disease, stage 4 (severe): Secondary | ICD-10-CM | POA: Diagnosis not present

## 2017-07-25 DIAGNOSIS — I4891 Unspecified atrial fibrillation: Secondary | ICD-10-CM | POA: Diagnosis not present

## 2017-07-28 DIAGNOSIS — I509 Heart failure, unspecified: Secondary | ICD-10-CM | POA: Diagnosis not present

## 2017-08-02 DIAGNOSIS — J45909 Unspecified asthma, uncomplicated: Secondary | ICD-10-CM | POA: Diagnosis not present

## 2017-08-02 DIAGNOSIS — I5032 Chronic diastolic (congestive) heart failure: Secondary | ICD-10-CM | POA: Diagnosis not present

## 2017-08-02 DIAGNOSIS — I25119 Atherosclerotic heart disease of native coronary artery with unspecified angina pectoris: Secondary | ICD-10-CM | POA: Diagnosis not present

## 2017-08-02 DIAGNOSIS — I4891 Unspecified atrial fibrillation: Secondary | ICD-10-CM | POA: Diagnosis not present

## 2017-08-02 DIAGNOSIS — M1990 Unspecified osteoarthritis, unspecified site: Secondary | ICD-10-CM | POA: Diagnosis not present

## 2017-08-02 DIAGNOSIS — E1122 Type 2 diabetes mellitus with diabetic chronic kidney disease: Secondary | ICD-10-CM | POA: Diagnosis not present

## 2017-08-02 DIAGNOSIS — N184 Chronic kidney disease, stage 4 (severe): Secondary | ICD-10-CM | POA: Diagnosis not present

## 2017-08-02 DIAGNOSIS — Z9981 Dependence on supplemental oxygen: Secondary | ICD-10-CM | POA: Diagnosis not present

## 2017-08-02 DIAGNOSIS — Z794 Long term (current) use of insulin: Secondary | ICD-10-CM | POA: Diagnosis not present

## 2017-08-02 DIAGNOSIS — Z7901 Long term (current) use of anticoagulants: Secondary | ICD-10-CM | POA: Diagnosis not present

## 2017-08-02 DIAGNOSIS — G4733 Obstructive sleep apnea (adult) (pediatric): Secondary | ICD-10-CM | POA: Diagnosis not present

## 2017-08-02 DIAGNOSIS — K22719 Barrett's esophagus with dysplasia, unspecified: Secondary | ICD-10-CM | POA: Diagnosis not present

## 2017-08-02 DIAGNOSIS — I130 Hypertensive heart and chronic kidney disease with heart failure and stage 1 through stage 4 chronic kidney disease, or unspecified chronic kidney disease: Secondary | ICD-10-CM | POA: Diagnosis not present

## 2017-08-02 DIAGNOSIS — Z7951 Long term (current) use of inhaled steroids: Secondary | ICD-10-CM | POA: Diagnosis not present

## 2017-08-06 DIAGNOSIS — Z794 Long term (current) use of insulin: Secondary | ICD-10-CM | POA: Diagnosis not present

## 2017-08-06 DIAGNOSIS — Z7951 Long term (current) use of inhaled steroids: Secondary | ICD-10-CM | POA: Diagnosis not present

## 2017-08-06 DIAGNOSIS — M1990 Unspecified osteoarthritis, unspecified site: Secondary | ICD-10-CM | POA: Diagnosis not present

## 2017-08-06 DIAGNOSIS — I4891 Unspecified atrial fibrillation: Secondary | ICD-10-CM | POA: Diagnosis not present

## 2017-08-06 DIAGNOSIS — K22719 Barrett's esophagus with dysplasia, unspecified: Secondary | ICD-10-CM | POA: Diagnosis not present

## 2017-08-06 DIAGNOSIS — I5032 Chronic diastolic (congestive) heart failure: Secondary | ICD-10-CM | POA: Diagnosis not present

## 2017-08-06 DIAGNOSIS — I130 Hypertensive heart and chronic kidney disease with heart failure and stage 1 through stage 4 chronic kidney disease, or unspecified chronic kidney disease: Secondary | ICD-10-CM | POA: Diagnosis not present

## 2017-08-06 DIAGNOSIS — Z9981 Dependence on supplemental oxygen: Secondary | ICD-10-CM | POA: Diagnosis not present

## 2017-08-06 DIAGNOSIS — G4733 Obstructive sleep apnea (adult) (pediatric): Secondary | ICD-10-CM | POA: Diagnosis not present

## 2017-08-06 DIAGNOSIS — E1122 Type 2 diabetes mellitus with diabetic chronic kidney disease: Secondary | ICD-10-CM | POA: Diagnosis not present

## 2017-08-06 DIAGNOSIS — Z7901 Long term (current) use of anticoagulants: Secondary | ICD-10-CM | POA: Diagnosis not present

## 2017-08-06 DIAGNOSIS — N184 Chronic kidney disease, stage 4 (severe): Secondary | ICD-10-CM | POA: Diagnosis not present

## 2017-08-06 DIAGNOSIS — J45909 Unspecified asthma, uncomplicated: Secondary | ICD-10-CM | POA: Diagnosis not present

## 2017-08-06 DIAGNOSIS — I25119 Atherosclerotic heart disease of native coronary artery with unspecified angina pectoris: Secondary | ICD-10-CM | POA: Diagnosis not present

## 2017-08-07 ENCOUNTER — Other Ambulatory Visit: Payer: Self-pay | Admitting: Internal Medicine

## 2017-08-08 ENCOUNTER — Other Ambulatory Visit (HOSPITAL_COMMUNITY): Payer: Self-pay | Admitting: *Deleted

## 2017-08-08 MED ORDER — ALBUTEROL SULFATE (2.5 MG/3ML) 0.083% IN NEBU: 2.5000 mg | INHALATION_SOLUTION | Freq: Four times a day (QID) | RESPIRATORY_TRACT | 3 refills | Status: DC | PRN

## 2017-08-14 DIAGNOSIS — M1990 Unspecified osteoarthritis, unspecified site: Secondary | ICD-10-CM | POA: Diagnosis not present

## 2017-08-14 DIAGNOSIS — I5032 Chronic diastolic (congestive) heart failure: Secondary | ICD-10-CM | POA: Diagnosis not present

## 2017-08-14 DIAGNOSIS — I25119 Atherosclerotic heart disease of native coronary artery with unspecified angina pectoris: Secondary | ICD-10-CM | POA: Diagnosis not present

## 2017-08-14 DIAGNOSIS — K22719 Barrett's esophagus with dysplasia, unspecified: Secondary | ICD-10-CM | POA: Diagnosis not present

## 2017-08-14 DIAGNOSIS — Z794 Long term (current) use of insulin: Secondary | ICD-10-CM | POA: Diagnosis not present

## 2017-08-14 DIAGNOSIS — E1122 Type 2 diabetes mellitus with diabetic chronic kidney disease: Secondary | ICD-10-CM | POA: Diagnosis not present

## 2017-08-14 DIAGNOSIS — Z7901 Long term (current) use of anticoagulants: Secondary | ICD-10-CM | POA: Diagnosis not present

## 2017-08-14 DIAGNOSIS — J45909 Unspecified asthma, uncomplicated: Secondary | ICD-10-CM | POA: Diagnosis not present

## 2017-08-14 DIAGNOSIS — Z7951 Long term (current) use of inhaled steroids: Secondary | ICD-10-CM | POA: Diagnosis not present

## 2017-08-14 DIAGNOSIS — G4733 Obstructive sleep apnea (adult) (pediatric): Secondary | ICD-10-CM | POA: Diagnosis not present

## 2017-08-14 DIAGNOSIS — I130 Hypertensive heart and chronic kidney disease with heart failure and stage 1 through stage 4 chronic kidney disease, or unspecified chronic kidney disease: Secondary | ICD-10-CM | POA: Diagnosis not present

## 2017-08-14 DIAGNOSIS — I4891 Unspecified atrial fibrillation: Secondary | ICD-10-CM | POA: Diagnosis not present

## 2017-08-14 DIAGNOSIS — Z9981 Dependence on supplemental oxygen: Secondary | ICD-10-CM | POA: Diagnosis not present

## 2017-08-14 DIAGNOSIS — N184 Chronic kidney disease, stage 4 (severe): Secondary | ICD-10-CM | POA: Diagnosis not present

## 2017-08-15 ENCOUNTER — Telehealth (HOSPITAL_COMMUNITY): Payer: Self-pay | Admitting: *Deleted

## 2017-08-15 ENCOUNTER — Other Ambulatory Visit (HOSPITAL_COMMUNITY): Payer: Self-pay | Admitting: Student

## 2017-08-15 NOTE — Telephone Encounter (Signed)
Clair Gulling, PT called to get orders for [facility], gave VO per Dr Aundra Dubin

## 2017-08-16 DIAGNOSIS — K22719 Barrett's esophagus with dysplasia, unspecified: Secondary | ICD-10-CM | POA: Diagnosis not present

## 2017-08-16 DIAGNOSIS — E1122 Type 2 diabetes mellitus with diabetic chronic kidney disease: Secondary | ICD-10-CM | POA: Diagnosis not present

## 2017-08-16 DIAGNOSIS — I5032 Chronic diastolic (congestive) heart failure: Secondary | ICD-10-CM | POA: Diagnosis not present

## 2017-08-16 DIAGNOSIS — M1990 Unspecified osteoarthritis, unspecified site: Secondary | ICD-10-CM | POA: Diagnosis not present

## 2017-08-16 DIAGNOSIS — Z9981 Dependence on supplemental oxygen: Secondary | ICD-10-CM | POA: Diagnosis not present

## 2017-08-16 DIAGNOSIS — I25119 Atherosclerotic heart disease of native coronary artery with unspecified angina pectoris: Secondary | ICD-10-CM | POA: Diagnosis not present

## 2017-08-16 DIAGNOSIS — J45909 Unspecified asthma, uncomplicated: Secondary | ICD-10-CM | POA: Diagnosis not present

## 2017-08-16 DIAGNOSIS — I130 Hypertensive heart and chronic kidney disease with heart failure and stage 1 through stage 4 chronic kidney disease, or unspecified chronic kidney disease: Secondary | ICD-10-CM | POA: Diagnosis not present

## 2017-08-16 DIAGNOSIS — I4891 Unspecified atrial fibrillation: Secondary | ICD-10-CM | POA: Diagnosis not present

## 2017-08-16 DIAGNOSIS — Z794 Long term (current) use of insulin: Secondary | ICD-10-CM | POA: Diagnosis not present

## 2017-08-16 DIAGNOSIS — G4733 Obstructive sleep apnea (adult) (pediatric): Secondary | ICD-10-CM | POA: Diagnosis not present

## 2017-08-16 DIAGNOSIS — Z7951 Long term (current) use of inhaled steroids: Secondary | ICD-10-CM | POA: Diagnosis not present

## 2017-08-16 DIAGNOSIS — N184 Chronic kidney disease, stage 4 (severe): Secondary | ICD-10-CM | POA: Diagnosis not present

## 2017-08-16 DIAGNOSIS — Z7901 Long term (current) use of anticoagulants: Secondary | ICD-10-CM | POA: Diagnosis not present

## 2017-08-18 DIAGNOSIS — M1990 Unspecified osteoarthritis, unspecified site: Secondary | ICD-10-CM | POA: Diagnosis not present

## 2017-08-18 DIAGNOSIS — N184 Chronic kidney disease, stage 4 (severe): Secondary | ICD-10-CM | POA: Diagnosis not present

## 2017-08-18 DIAGNOSIS — I4891 Unspecified atrial fibrillation: Secondary | ICD-10-CM | POA: Diagnosis not present

## 2017-08-18 DIAGNOSIS — I130 Hypertensive heart and chronic kidney disease with heart failure and stage 1 through stage 4 chronic kidney disease, or unspecified chronic kidney disease: Secondary | ICD-10-CM | POA: Diagnosis not present

## 2017-08-18 DIAGNOSIS — G4733 Obstructive sleep apnea (adult) (pediatric): Secondary | ICD-10-CM | POA: Diagnosis not present

## 2017-08-18 DIAGNOSIS — I25119 Atherosclerotic heart disease of native coronary artery with unspecified angina pectoris: Secondary | ICD-10-CM | POA: Diagnosis not present

## 2017-08-18 DIAGNOSIS — Z7901 Long term (current) use of anticoagulants: Secondary | ICD-10-CM | POA: Diagnosis not present

## 2017-08-18 DIAGNOSIS — K22719 Barrett's esophagus with dysplasia, unspecified: Secondary | ICD-10-CM | POA: Diagnosis not present

## 2017-08-18 DIAGNOSIS — E1122 Type 2 diabetes mellitus with diabetic chronic kidney disease: Secondary | ICD-10-CM | POA: Diagnosis not present

## 2017-08-18 DIAGNOSIS — Z794 Long term (current) use of insulin: Secondary | ICD-10-CM | POA: Diagnosis not present

## 2017-08-18 DIAGNOSIS — Z9981 Dependence on supplemental oxygen: Secondary | ICD-10-CM | POA: Diagnosis not present

## 2017-08-18 DIAGNOSIS — Z7951 Long term (current) use of inhaled steroids: Secondary | ICD-10-CM | POA: Diagnosis not present

## 2017-08-18 DIAGNOSIS — J45909 Unspecified asthma, uncomplicated: Secondary | ICD-10-CM | POA: Diagnosis not present

## 2017-08-18 DIAGNOSIS — I5032 Chronic diastolic (congestive) heart failure: Secondary | ICD-10-CM | POA: Diagnosis not present

## 2017-08-20 DIAGNOSIS — R0602 Shortness of breath: Secondary | ICD-10-CM | POA: Diagnosis not present

## 2017-08-24 DIAGNOSIS — I10 Essential (primary) hypertension: Secondary | ICD-10-CM | POA: Diagnosis not present

## 2017-08-24 DIAGNOSIS — I509 Heart failure, unspecified: Secondary | ICD-10-CM | POA: Diagnosis not present

## 2017-08-24 DIAGNOSIS — J449 Chronic obstructive pulmonary disease, unspecified: Secondary | ICD-10-CM | POA: Diagnosis not present

## 2017-08-24 DIAGNOSIS — E1022 Type 1 diabetes mellitus with diabetic chronic kidney disease: Secondary | ICD-10-CM | POA: Diagnosis not present

## 2017-08-24 DIAGNOSIS — G629 Polyneuropathy, unspecified: Secondary | ICD-10-CM | POA: Diagnosis not present

## 2017-08-24 DIAGNOSIS — Z950 Presence of cardiac pacemaker: Secondary | ICD-10-CM | POA: Diagnosis not present

## 2017-08-24 DIAGNOSIS — I5032 Chronic diastolic (congestive) heart failure: Secondary | ICD-10-CM | POA: Diagnosis not present

## 2017-08-24 DIAGNOSIS — N185 Chronic kidney disease, stage 5: Secondary | ICD-10-CM | POA: Diagnosis not present

## 2017-08-24 DIAGNOSIS — I4891 Unspecified atrial fibrillation: Secondary | ICD-10-CM | POA: Diagnosis not present

## 2017-08-27 DIAGNOSIS — I509 Heart failure, unspecified: Secondary | ICD-10-CM | POA: Diagnosis not present

## 2017-09-04 DIAGNOSIS — K22719 Barrett's esophagus with dysplasia, unspecified: Secondary | ICD-10-CM | POA: Diagnosis not present

## 2017-09-04 DIAGNOSIS — J45909 Unspecified asthma, uncomplicated: Secondary | ICD-10-CM | POA: Diagnosis not present

## 2017-09-04 DIAGNOSIS — I5032 Chronic diastolic (congestive) heart failure: Secondary | ICD-10-CM | POA: Diagnosis not present

## 2017-09-04 DIAGNOSIS — Z7901 Long term (current) use of anticoagulants: Secondary | ICD-10-CM | POA: Diagnosis not present

## 2017-09-04 DIAGNOSIS — I25119 Atherosclerotic heart disease of native coronary artery with unspecified angina pectoris: Secondary | ICD-10-CM | POA: Diagnosis not present

## 2017-09-04 DIAGNOSIS — G4733 Obstructive sleep apnea (adult) (pediatric): Secondary | ICD-10-CM | POA: Diagnosis not present

## 2017-09-04 DIAGNOSIS — E1122 Type 2 diabetes mellitus with diabetic chronic kidney disease: Secondary | ICD-10-CM | POA: Diagnosis not present

## 2017-09-04 DIAGNOSIS — Z9981 Dependence on supplemental oxygen: Secondary | ICD-10-CM | POA: Diagnosis not present

## 2017-09-04 DIAGNOSIS — I130 Hypertensive heart and chronic kidney disease with heart failure and stage 1 through stage 4 chronic kidney disease, or unspecified chronic kidney disease: Secondary | ICD-10-CM | POA: Diagnosis not present

## 2017-09-04 DIAGNOSIS — Z7951 Long term (current) use of inhaled steroids: Secondary | ICD-10-CM | POA: Diagnosis not present

## 2017-09-04 DIAGNOSIS — Z794 Long term (current) use of insulin: Secondary | ICD-10-CM | POA: Diagnosis not present

## 2017-09-04 DIAGNOSIS — N184 Chronic kidney disease, stage 4 (severe): Secondary | ICD-10-CM | POA: Diagnosis not present

## 2017-09-04 DIAGNOSIS — M1990 Unspecified osteoarthritis, unspecified site: Secondary | ICD-10-CM | POA: Diagnosis not present

## 2017-09-04 DIAGNOSIS — I4891 Unspecified atrial fibrillation: Secondary | ICD-10-CM | POA: Diagnosis not present

## 2017-09-04 DIAGNOSIS — I509 Heart failure, unspecified: Secondary | ICD-10-CM | POA: Diagnosis not present

## 2017-09-06 ENCOUNTER — Ambulatory Visit (INDEPENDENT_AMBULATORY_CARE_PROVIDER_SITE_OTHER): Payer: PPO | Admitting: *Deleted

## 2017-09-06 DIAGNOSIS — E1122 Type 2 diabetes mellitus with diabetic chronic kidney disease: Secondary | ICD-10-CM | POA: Diagnosis not present

## 2017-09-06 DIAGNOSIS — Z9981 Dependence on supplemental oxygen: Secondary | ICD-10-CM | POA: Diagnosis not present

## 2017-09-06 DIAGNOSIS — J45909 Unspecified asthma, uncomplicated: Secondary | ICD-10-CM | POA: Diagnosis not present

## 2017-09-06 DIAGNOSIS — I495 Sick sinus syndrome: Secondary | ICD-10-CM

## 2017-09-06 DIAGNOSIS — N184 Chronic kidney disease, stage 4 (severe): Secondary | ICD-10-CM | POA: Diagnosis not present

## 2017-09-06 DIAGNOSIS — I5032 Chronic diastolic (congestive) heart failure: Secondary | ICD-10-CM | POA: Diagnosis not present

## 2017-09-06 DIAGNOSIS — I25119 Atherosclerotic heart disease of native coronary artery with unspecified angina pectoris: Secondary | ICD-10-CM | POA: Diagnosis not present

## 2017-09-06 DIAGNOSIS — I4891 Unspecified atrial fibrillation: Secondary | ICD-10-CM | POA: Diagnosis not present

## 2017-09-06 DIAGNOSIS — Z794 Long term (current) use of insulin: Secondary | ICD-10-CM | POA: Diagnosis not present

## 2017-09-06 DIAGNOSIS — G4733 Obstructive sleep apnea (adult) (pediatric): Secondary | ICD-10-CM | POA: Diagnosis not present

## 2017-09-06 DIAGNOSIS — I130 Hypertensive heart and chronic kidney disease with heart failure and stage 1 through stage 4 chronic kidney disease, or unspecified chronic kidney disease: Secondary | ICD-10-CM | POA: Diagnosis not present

## 2017-09-06 DIAGNOSIS — Z7951 Long term (current) use of inhaled steroids: Secondary | ICD-10-CM | POA: Diagnosis not present

## 2017-09-06 DIAGNOSIS — M1990 Unspecified osteoarthritis, unspecified site: Secondary | ICD-10-CM | POA: Diagnosis not present

## 2017-09-06 DIAGNOSIS — K22719 Barrett's esophagus with dysplasia, unspecified: Secondary | ICD-10-CM | POA: Diagnosis not present

## 2017-09-06 DIAGNOSIS — Z7901 Long term (current) use of anticoagulants: Secondary | ICD-10-CM | POA: Diagnosis not present

## 2017-09-06 NOTE — Progress Notes (Signed)
Remote pacemaker transmission.   

## 2017-09-07 ENCOUNTER — Encounter: Payer: Self-pay | Admitting: Cardiology

## 2017-09-11 DIAGNOSIS — E1122 Type 2 diabetes mellitus with diabetic chronic kidney disease: Secondary | ICD-10-CM | POA: Diagnosis not present

## 2017-09-11 DIAGNOSIS — K22719 Barrett's esophagus with dysplasia, unspecified: Secondary | ICD-10-CM | POA: Diagnosis not present

## 2017-09-11 DIAGNOSIS — I5032 Chronic diastolic (congestive) heart failure: Secondary | ICD-10-CM | POA: Diagnosis not present

## 2017-09-11 DIAGNOSIS — G4733 Obstructive sleep apnea (adult) (pediatric): Secondary | ICD-10-CM | POA: Diagnosis not present

## 2017-09-11 DIAGNOSIS — Z9981 Dependence on supplemental oxygen: Secondary | ICD-10-CM | POA: Diagnosis not present

## 2017-09-11 DIAGNOSIS — J45909 Unspecified asthma, uncomplicated: Secondary | ICD-10-CM | POA: Diagnosis not present

## 2017-09-11 DIAGNOSIS — I4891 Unspecified atrial fibrillation: Secondary | ICD-10-CM | POA: Diagnosis not present

## 2017-09-11 DIAGNOSIS — Z7901 Long term (current) use of anticoagulants: Secondary | ICD-10-CM | POA: Diagnosis not present

## 2017-09-11 DIAGNOSIS — M1990 Unspecified osteoarthritis, unspecified site: Secondary | ICD-10-CM | POA: Diagnosis not present

## 2017-09-11 DIAGNOSIS — Z7951 Long term (current) use of inhaled steroids: Secondary | ICD-10-CM | POA: Diagnosis not present

## 2017-09-11 DIAGNOSIS — I25119 Atherosclerotic heart disease of native coronary artery with unspecified angina pectoris: Secondary | ICD-10-CM | POA: Diagnosis not present

## 2017-09-11 DIAGNOSIS — N184 Chronic kidney disease, stage 4 (severe): Secondary | ICD-10-CM | POA: Diagnosis not present

## 2017-09-11 DIAGNOSIS — Z794 Long term (current) use of insulin: Secondary | ICD-10-CM | POA: Diagnosis not present

## 2017-09-11 DIAGNOSIS — I130 Hypertensive heart and chronic kidney disease with heart failure and stage 1 through stage 4 chronic kidney disease, or unspecified chronic kidney disease: Secondary | ICD-10-CM | POA: Diagnosis not present

## 2017-09-14 ENCOUNTER — Other Ambulatory Visit: Payer: Self-pay

## 2017-09-14 ENCOUNTER — Ambulatory Visit (HOSPITAL_COMMUNITY)
Admission: RE | Admit: 2017-09-14 | Discharge: 2017-09-14 | Disposition: A | Discharge status: Home / Self Care | Payer: PPO | Source: Ambulatory Visit | Attending: Cardiology | Admitting: Cardiology

## 2017-09-14 ENCOUNTER — Encounter (HOSPITAL_COMMUNITY): Payer: Self-pay | Admitting: Cardiology

## 2017-09-14 VITALS — BP 129/59 | HR 70 | Wt 263.5 lb

## 2017-09-14 DIAGNOSIS — J45909 Unspecified asthma, uncomplicated: Secondary | ICD-10-CM | POA: Insufficient documentation

## 2017-09-14 DIAGNOSIS — G4733 Obstructive sleep apnea (adult) (pediatric): Secondary | ICD-10-CM | POA: Insufficient documentation

## 2017-09-14 DIAGNOSIS — E1151 Type 2 diabetes mellitus with diabetic peripheral angiopathy without gangrene: Secondary | ICD-10-CM | POA: Insufficient documentation

## 2017-09-14 DIAGNOSIS — E785 Hyperlipidemia, unspecified: Secondary | ICD-10-CM | POA: Diagnosis not present

## 2017-09-14 DIAGNOSIS — I481 Persistent atrial fibrillation: Secondary | ICD-10-CM | POA: Insufficient documentation

## 2017-09-14 DIAGNOSIS — I4892 Unspecified atrial flutter: Secondary | ICD-10-CM | POA: Diagnosis not present

## 2017-09-14 DIAGNOSIS — Z7902 Long term (current) use of antithrombotics/antiplatelets: Secondary | ICD-10-CM | POA: Insufficient documentation

## 2017-09-14 DIAGNOSIS — Z8041 Family history of malignant neoplasm of ovary: Secondary | ICD-10-CM | POA: Insufficient documentation

## 2017-09-14 DIAGNOSIS — K219 Gastro-esophageal reflux disease without esophagitis: Secondary | ICD-10-CM | POA: Insufficient documentation

## 2017-09-14 DIAGNOSIS — Z8052 Family history of malignant neoplasm of bladder: Secondary | ICD-10-CM | POA: Insufficient documentation

## 2017-09-14 DIAGNOSIS — I4891 Unspecified atrial fibrillation: Secondary | ICD-10-CM

## 2017-09-14 DIAGNOSIS — Z66 Do not resuscitate: Secondary | ICD-10-CM | POA: Insufficient documentation

## 2017-09-14 DIAGNOSIS — Z6841 Body Mass Index (BMI) 40.0 and over, adult: Secondary | ICD-10-CM | POA: Insufficient documentation

## 2017-09-14 DIAGNOSIS — I5032 Chronic diastolic (congestive) heart failure: Secondary | ICD-10-CM | POA: Insufficient documentation

## 2017-09-14 DIAGNOSIS — I2510 Atherosclerotic heart disease of native coronary artery without angina pectoris: Secondary | ICD-10-CM | POA: Diagnosis not present

## 2017-09-14 DIAGNOSIS — M109 Gout, unspecified: Secondary | ICD-10-CM | POA: Diagnosis not present

## 2017-09-14 DIAGNOSIS — R7989 Other specified abnormal findings of blood chemistry: Secondary | ICD-10-CM

## 2017-09-14 DIAGNOSIS — I480 Paroxysmal atrial fibrillation: Secondary | ICD-10-CM | POA: Diagnosis not present

## 2017-09-14 DIAGNOSIS — I495 Sick sinus syndrome: Secondary | ICD-10-CM | POA: Diagnosis not present

## 2017-09-14 DIAGNOSIS — I130 Hypertensive heart and chronic kidney disease with heart failure and stage 1 through stage 4 chronic kidney disease, or unspecified chronic kidney disease: Secondary | ICD-10-CM | POA: Diagnosis not present

## 2017-09-14 DIAGNOSIS — N184 Chronic kidney disease, stage 4 (severe): Secondary | ICD-10-CM | POA: Diagnosis not present

## 2017-09-14 DIAGNOSIS — I252 Old myocardial infarction: Secondary | ICD-10-CM | POA: Insufficient documentation

## 2017-09-14 DIAGNOSIS — Z841 Family history of disorders of kidney and ureter: Secondary | ICD-10-CM | POA: Insufficient documentation

## 2017-09-14 DIAGNOSIS — Z9981 Dependence on supplemental oxygen: Secondary | ICD-10-CM | POA: Insufficient documentation

## 2017-09-14 DIAGNOSIS — Z8249 Family history of ischemic heart disease and other diseases of the circulatory system: Secondary | ICD-10-CM | POA: Insufficient documentation

## 2017-09-14 DIAGNOSIS — E1122 Type 2 diabetes mellitus with diabetic chronic kidney disease: Secondary | ICD-10-CM | POA: Insufficient documentation

## 2017-09-14 DIAGNOSIS — J849 Interstitial pulmonary disease, unspecified: Secondary | ICD-10-CM | POA: Diagnosis not present

## 2017-09-14 DIAGNOSIS — Z800 Family history of malignant neoplasm of digestive organs: Secondary | ICD-10-CM | POA: Insufficient documentation

## 2017-09-14 DIAGNOSIS — Z79899 Other long term (current) drug therapy: Secondary | ICD-10-CM | POA: Diagnosis not present

## 2017-09-14 DIAGNOSIS — Z794 Long term (current) use of insulin: Secondary | ICD-10-CM | POA: Insufficient documentation

## 2017-09-14 DIAGNOSIS — Z9889 Other specified postprocedural states: Secondary | ICD-10-CM | POA: Insufficient documentation

## 2017-09-14 DIAGNOSIS — Z87891 Personal history of nicotine dependence: Secondary | ICD-10-CM | POA: Insufficient documentation

## 2017-09-14 LAB — COMPREHENSIVE METABOLIC PANEL
ALT: 14 U/L (ref 14–54)
AST: 20 U/L (ref 15–41)
Albumin: 3.8 g/dL (ref 3.5–5.0)
Alkaline Phosphatase: 59 U/L (ref 38–126)
Anion gap: 11 (ref 5–15)
BUN: 50 mg/dL — ABNORMAL HIGH (ref 6–20)
CO2: 27 mmol/L (ref 22–32)
Calcium: 9.5 mg/dL (ref 8.9–10.3)
Chloride: 102 mmol/L (ref 101–111)
Creatinine, Ser: 3.9 mg/dL — ABNORMAL HIGH (ref 0.44–1.00)
GFR calc Af Amer: 12 mL/min — ABNORMAL LOW (ref >60)
GFR calc non Af Amer: 11 mL/min — ABNORMAL LOW (ref >60)
Glucose, Bld: 138 mg/dL — ABNORMAL HIGH (ref 65–99)
Potassium: 3.8 mmol/L (ref 3.5–5.1)
Sodium: 140 mmol/L (ref 135–145)
Total Bilirubin: 0.7 mg/dL (ref 0.3–1.2)
Total Protein: 7.3 g/dL (ref 6.5–8.1)

## 2017-09-14 LAB — TSH: TSH: 2.544 u[IU]/mL (ref 0.350–4.500)

## 2017-09-14 MED ORDER — METOLAZONE 2.5 MG PO TABS: 2.5000 mg | ORAL_TABLET | ORAL | 6 refills | Status: DC

## 2017-09-14 NOTE — Patient Instructions (Addendum)
Increase metolazone 2.5 mg (1 tab) on Tuesday, Thursday, and Saturday  Labs drawn today (if we do not call you, then your lab work was stable)   Your physician recommends that you return for lab work in: 10 days  Your physician recommends that you schedule a follow-up appointment in: 1 month in [facility]

## 2017-09-15 NOTE — Progress Notes (Signed)
Patient ID: [name], female   DOB: May 06, 1946, 72 y.o.   MRN: [id number]   PCP: Dr. Alyson Ingles  Nephrologist: Dr. Florene Glen Cardiology: Dr Aundra Dubin  HPI: [name] is a 72 year-old woman with hypertension, hyperlipidemia, bronchial asthma, diabetes mellitus, CKD stage IV (baseline cr ~2.5), CAD, atrial fibrillation and diastolic HF.  Cath 5/12 with Promus DES to proximal LCx.  RCA was totally occluded with collaterals.   She was admitted in 4/15 for R TKR. That hospitalization complicated by a/c diastolic HF with respiratory distress requiring non-rebreather support. Weight on discharge was 226 pounds. ABG at that time 7.4/34/91/97%. Cr peaked at 2.9  Admitted 6/30-7/10/15 for SOB and CP following scheduled cardioversion. Found to have severe aspiration PNA and was intubated. She maintained SR for short period of time and then went back into Afib/Aflutter.  HR controlled on amio and diltiazem. Discharged to [facility] at a weight of 72 lbs.   Admitted 06/16/14 with symptomatic tachy/brady syndrome. 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 Discharge weight was 223 pounds.   Admitted 02/16/15 for symptomatic anemia s/p blood work from her pulmonologist.  Was diuresed with lasix while in hospital.   PFTs (6/16) with FVC 80%, FEV1 83%, ratio 103%, TLC 75%, DLCO 78% => restriction c/w interstitial process.  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   Holter (7/16) with rare PACs, PVCs; no atrial fibrillation.  After her CT showing interstitial fibrosis, she saw pulmonary. There was concern for connective tissue disease with lung involvement.  She has seen rheumatology and was told that she likely has rheumatoid arthritis.  The pulmonologist did not think that her ILD was due to amiodarone, so she is still on amiodarone.    Peripheral arterial dopplers in 6/18 showed 50-74% left mid SFA stenosis and 30-49%  right mid SFA stenosis.    Echo 03/21/17 EF 55-60% with moderate LVH, normal RV size and systolic function, mild MR.    S/p DCCV 03/30/17 with return to NSR.   She is seen today in followup of diastolic CHF and paroxysmal atrial fibrillation.  She remains out of atrial fibrillation (a-paced).  Still short of breath walking around her house.  Using home oxygen and CPAP at night. She has chronic orthopnea requiring 2 pillows.  No chest pain. Weight up significantly (though symptoms seem to be unchanged).   ECG (personally reviewed): a-paced, lateral T wave inversions.    Labs: 11/16: K 3.5, creatinine 3.4, HCT 45.1 2/17: K 3, creatinine 3.22, LFTs normal, TSH normal, HCT 46.9 7/17: K 3.6, creatinine 3.6, HCT 43.3, LFTs normal, TSH normal 5/18: K 4.5, creatinine 4.07, TSH normal, LFTs normal, hgb 14.6 7/18: K 4.2, creatinine 4.27, LDL 52, TSH normal 10/18: K 4.1, creatinine 3.64 11/18: TSH normal, hgb 14.3, K 4, creatinine 4.38, LFTs normal  Review of systems complete and found to be negative unless listed in HPI.    SH:  Social History   Socioeconomic History  . Marital status: Widowed    Spouse name: Not on file  . Number of children: 3  . Years of education: Not on file  . Highest education level: Not on file  Social Needs  . Financial resource strain: Not on file  . Food insecurity - worry: Not on file  . Food insecurity - inability: Not on file  . Transportation needs - medical: Not on file  . Transportation needs - non-medical: Not on file  Occupational  History  . Occupation: Disabled  Tobacco Use  . Smoking status: Former Smoker    Packs/day: 0.50    Years: 35.00    Pack years: 17.50    Types: Cigarettes    Last attempt to quit: 02/16/1995    Years since quitting: 22.5  . Smokeless tobacco: Never Used  . Tobacco comment: 05/2014  QUIT OVER 20 YEARS AGO "  Substance and Sexual Activity  . Alcohol use: No    Alcohol/week: 0.0 oz  . Drug use: No  . Sexual activity:  Not Currently  Other Topics Concern  . Not on file  Social History Narrative  . Not on file    FH:  Family History  Problem Relation Age of Onset  . Heart disease Mother   . Bladder Cancer Mother   . Kidney disease Mother   . Ovarian cancer Daughter   . Stomach cancer Maternal Uncle   . Colon cancer Maternal Aunt        dx in her 68's  . Esophageal cancer Neg Hx     Past Medical History:  Diagnosis Date  . Anginal pain ([place])    occ; non-ischemic Lexiscan 09/2012  . Arthritis   . Asthma   . Atrial flutter ([place])    ablated by Dr Lovena Le in 2008  . CKD (chronic kidney disease) 04/2007   CKD stage 4(Dr. Erling Cruz)  . Complication of anesthesia     DIFFICULTY BREATHING   . Coronary atherosclerosis of native coronary artery   . Diastolic heart failure 0/3500   grade 2 diastolic dysfunction per 05/3817 echo  . DNI (do not intubate)   . DNR (do not resuscitate)   . Esophageal dysmotility 2008   noted on esophagram.  hx dysphagia.   . Fatty liver 2008   noted on ultrasound 2008  . Fatty tumor fatty tumor back  . GERD (gastroesophageal reflux disease) 2012   Barrets esophagus on bx 2012 and 2014.   Marland Kitchen Gouty arthropathy   . Heart murmur   . Hyperlipidemia   . Hypertension   . 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   . Morbid obesity ([place])   . Myocardial infarction ([place]) 2009  . Peripheral vascular disease ([place])   . Persistent atrial fibrillation (HCC)    chads2 vasc score of at least 5  . Sick sinus syndrome ([place])   . Sleep apnea    wears CPAP   Current Outpatient Medications  Medication Sig Dispense Refill  . albuterol (PROVENTIL) (2.5 MG/3ML) 0.083% nebulizer solution Take 3 mLs (2.5 mg total) by nebulization every 6 (six) hours as needed for shortness of breath. 75 mL 3  . amiodarone (PACERONE) 200 MG tablet Take 1 tablet (200 mg total) by mouth daily. 30 tablet 6  . amitriptyline (ELAVIL) 25 MG tablet Take 25 mg by mouth at bedtime.      Marland Kitchen  atorvastatin (LIPITOR) 40 MG tablet TAKE ONE TABLET BY MOUTH ONCE DAILY 90 tablet 3  . dicyclomine (BENTYL) 10 MG capsule Take 10 mg by mouth daily.     Marland Kitchen diltiazem (CARTIA XT) 180 MG 24 hr capsule Take 1 capsule (180 mg total) daily by mouth. 30 capsule 3  . ELIQUIS 5 MG TABS tablet TAKE ONE TABLET BY MOUTH TWICE DAILY 60 tablet 6  . esomeprazole (NEXIUM) 40 MG capsule TAKE 1 CAPSULE BY MOUTH TWICE DAILY BEFORE MEAL(S) 60 capsule 1  . gabapentin (NEURONTIN) 300 MG capsule Take 300 mg  by mouth 2 (two) times daily.    . hydrALAZINE (APRESOLINE) 25 MG tablet Take 1 tablet (25 mg total) 3 (three) times daily by mouth. 90 tablet 3  . insulin glargine (LANTUS) 100 UNIT/ML injection Inject 60 Units into the skin every morning.     . isosorbide mononitrate (IMDUR) 30 MG 24 hr tablet Take 1 tablet (30 mg total) by mouth daily. 90 tablet 3  . levalbuterol (XOPENEX) 0.31 MG/3ML nebulizer solution Take 1 ampule by nebulization every 4 (four) hours as needed for wheezing.    Marland Kitchen levothyroxine (SYNTHROID, LEVOTHROID) 25 MCG tablet Take 1 tablet by mouth daily.    Marland Kitchen LINZESS 290 MCG CAPS capsule     . metolazone (ZAROXOLYN) 2.5 MG tablet Take 1 tablet (2.5 mg total) by mouth 3 (three) times a week. Tuesday, Thursday, and Saturday 12 tablet 6  . metoprolol tartrate (LOPRESSOR) 25 MG tablet TAKE 3 TABLETS BY MOUTH TWICE DAILY 180 tablet 6  . nitroGLYCERIN (NITROSTAT) 0.4 MG SL tablet Place 0.4 mg under the tongue every 5 (five) minutes as needed for chest pain (x 3 doses). Reported on 02/25/2016    . NOVOLOG FLEXPEN 100 UNIT/ML FlexPen     . omeprazole (PRILOSEC) 40 MG capsule Take 1 capsule (40 mg total) by mouth 2 (two) times daily before a meal. 120 capsule 3  . OXYGEN Inhale 3 L/min into the lungs continuous. Patient uses nightly    . potassium chloride SA (K-DUR,KLOR-CON) 20 MEQ tablet Take 40 mEq by mouth QID.    Marland Kitchen ranitidine (ZANTAC) 150 MG tablet Take 150 mg by mouth as needed for heartburn.    Marland Kitchen RELION PEN  NEEDLES 32G X 4 MM MISC     . torsemide (DEMADEX) 100 MG tablet Take 100 mg by mouth 2 (two) times daily.    Marland Kitchen ULORIC 80 MG TABS Take 1 tablet by mouth daily.     . Vitamin D, Ergocalciferol, (DRISDOL) 50000 UNITS CAPS capsule Take 50,000 Units by mouth every Monday.    Penne Lash HFA 45 MCG/ACT inhaler Inhale 2 puffs into the lungs every 4 (four) hours as needed for wheezing or shortness of breath. 1 Inhaler 1  . zolpidem (AMBIEN) 10 MG tablet Take 10 mg by mouth at bedtime.      No current facility-administered medications for this encounter.     Vitals:   09/14/17 1131  BP: (!) 129/59  Pulse: 70  SpO2: 94%  Weight: 263 lb 8 oz (119.5 kg)    PHYSICAL EXAM: General: NAD Neck: JVP 8-9 cm, no thyromegaly or thyroid nodule.  Lungs: Clear to auscultation bilaterally with normal respiratory effort. CV: Nondisplaced PMI.  Heart regular S1/S2, no S3/S4, no murmur.  1+ edema 1/2 to knees bilaterally.  No carotid bruit.  Normal pedal pulses.  Abdomen: Soft, nontender, no hepatosplenomegaly, no distention.  Skin: Intact without lesions or rashes.  Neurologic: Alert and oriented x 3.  Psych: Normal affect. Extremities: No clubbing or cyanosis.  HEENT: Normal.   ASSESSMENT & PLAN:  1) Chronic diastolic HF: Echo 3/79 with EF 55-60% with moderate LVH and normal RV.  NYHA III, dyspnea likely has a pulmonary component with restrictive PFTs from body habitus and ILD (NSIP pattern on high res chest CT).  Weight is up and she looks volume overloaded. This is complicated by CKD stage 4.   - Continue torsemide 100 mg BID with K replacement.   - Increase metolazone to 3 days/week.  - BMET today and again  in 10 days.  2) Atrial fibrillation: A paced. - Continue amiodarone, check LFTs and TSH today and will need regular eye exam.  - Continue Eliquis for anticoagulation.   3) CKD stage 4: BMET today 4) Tachy/Brady syndrome: s/p St Jude PPM.    5) OSA: Encouraged nightly CPAP  6) Interstitial lung  disease: Has ILD, may be RA-related.  Has seen Dr Amil Amen and follows with Dr Halford Chessman for pulmonology.   - Continue home oxygen.  7) Hyperlipidemia: Continue Lipitor, good lipids in 7/18.  8) CAD: She is on Imdur now, no chest pain.   - Continue statin.  - Continue Imdur. - No ASA with Eliquis use.   Followup in 1 month with NP/PA.    [name], MD  09/15/2017

## 2017-09-21 LAB — CUP PACEART REMOTE DEVICE CHECK
Battery Remaining Longevity: 106 mo
Battery Remaining Percentage: 95.5 %
Battery Voltage: 2.99 V
Brady Statistic AP VP Percent: 10 %
Brady Statistic AP VS Percent: 89 %
Brady Statistic AS VP Percent: 1 %
Brady Statistic AS VS Percent: 1 %
Brady Statistic RA Percent Paced: 97 %
Brady Statistic RV Percent Paced: 10 %
Date Time Interrogation Session: 20190103070015
Implantable Lead Implant Date: 20151015
Implantable Lead Implant Date: 20151015
Implantable Lead Location: 753859
Implantable Lead Location: 753860
Implantable Lead Model: 1948
Implantable Pulse Generator Implant Date: 20151015
Lead Channel Impedance Value: 460 Ohm
Lead Channel Impedance Value: 560 Ohm
Lead Channel Pacing Threshold Amplitude: 0.75 V
Lead Channel Pacing Threshold Amplitude: 1.25 V
Lead Channel Pacing Threshold Pulse Width: 0.5 ms
Lead Channel Pacing Threshold Pulse Width: 0.5 ms
Lead Channel Sensing Intrinsic Amplitude: 12 mV
Lead Channel Sensing Intrinsic Amplitude: 5 mV
Lead Channel Setting Pacing Amplitude: 2 V
Lead Channel Setting Pacing Amplitude: 2.5 V
Lead Channel Setting Pacing Pulse Width: 0.5 ms
Lead Channel Setting Sensing Sensitivity: 2 mV
Pulse Gen Model: 2240
Pulse Gen Serial Number: 7665001

## 2017-09-27 ENCOUNTER — Telehealth (HOSPITAL_COMMUNITY): Payer: Self-pay

## 2017-09-27 DIAGNOSIS — I509 Heart failure, unspecified: Secondary | ICD-10-CM | POA: Diagnosis not present

## 2017-09-27 NOTE — Telephone Encounter (Signed)
Pt needs to be walked for oxygen renewal. Had appoint for lab already scheduled for 09/28/17 at 930 will have RN visit after.

## 2017-09-28 ENCOUNTER — Ambulatory Visit (HOSPITAL_BASED_OUTPATIENT_CLINIC_OR_DEPARTMENT_OTHER)
Admission: RE | Admit: 2017-09-28 | Discharge: 2017-09-28 | Disposition: A | Discharge status: Home / Self Care | Payer: PPO | Source: Ambulatory Visit | Attending: Cardiology | Admitting: Cardiology

## 2017-09-28 ENCOUNTER — Telehealth (HOSPITAL_COMMUNITY): Payer: Self-pay

## 2017-09-28 ENCOUNTER — Ambulatory Visit (HOSPITAL_COMMUNITY)
Admission: RE | Admit: 2017-09-28 | Discharge: 2017-09-28 | Disposition: A | Discharge status: Home / Self Care | Payer: PPO | Source: Ambulatory Visit | Attending: Cardiology | Admitting: Cardiology

## 2017-09-28 VITALS — BP 130/76 | HR 70

## 2017-09-28 DIAGNOSIS — I5022 Chronic systolic (congestive) heart failure: Secondary | ICD-10-CM

## 2017-09-28 DIAGNOSIS — I5032 Chronic diastolic (congestive) heart failure: Secondary | ICD-10-CM | POA: Diagnosis present

## 2017-09-28 DIAGNOSIS — R2681 Unsteadiness on feet: Secondary | ICD-10-CM

## 2017-09-28 LAB — BASIC METABOLIC PANEL
Anion gap: 16 — ABNORMAL HIGH (ref 5–15)
BUN: 63 mg/dL — ABNORMAL HIGH (ref 6–20)
CO2: 25 mmol/L (ref 22–32)
Calcium: 9.4 mg/dL (ref 8.9–10.3)
Chloride: 99 mmol/L — ABNORMAL LOW (ref 101–111)
Creatinine, Ser: 4.48 mg/dL — ABNORMAL HIGH (ref 0.44–1.00)
GFR calc Af Amer: 10 mL/min — ABNORMAL LOW (ref >60)
GFR calc non Af Amer: 9 mL/min — ABNORMAL LOW (ref >60)
Glucose, Bld: 213 mg/dL — ABNORMAL HIGH (ref 65–99)
Potassium: 3.8 mmol/L (ref 3.5–5.1)
Sodium: 140 mmol/L (ref 135–145)

## 2017-09-28 MED ORDER — METOLAZONE 2.5 MG PO TABS: 2.5000 mg | ORAL_TABLET | ORAL | 6 refills | Status: DC

## 2017-09-28 NOTE — Progress Notes (Signed)
Patient was seen for 6 min walk test to qualify for home oxygen. Did not complete 6 min walk due to weakness and unsteady gait. Completed 45 sec of walk test and walked 37ft. Referral placed with neurology per Dr.McLean.   At rest: On room air-94% On 4L of O2- 98%  Ambulating: On room air- 88% On 4L of O2-95%

## 2017-09-28 NOTE — Telephone Encounter (Signed)
Notes recorded by Shirley Muscat, RN on 09/28/2017 at 4:12 PM EST Pt aware of results and agreeable to med changes (changes made in [facility])  ------  Notes recorded by Larey Dresser, MD on 09/28/2017 at 3:44 PM EST BUN/creatinine higher, drop metolazone back to twice weekly.

## 2017-09-28 NOTE — Addendum Note (Signed)
Encounter addended by: Harvie Junior, CMA on: 09/28/2017 10:04 AM  Actions taken: Visit diagnoses modified, Diagnosis association updated, Order list changed, Sign clinical note

## 2017-10-04 ENCOUNTER — Encounter (HOSPITAL_COMMUNITY): Payer: Self-pay

## 2017-10-04 DIAGNOSIS — G4733 Obstructive sleep apnea (adult) (pediatric): Secondary | ICD-10-CM | POA: Diagnosis not present

## 2017-10-19 ENCOUNTER — Ambulatory Visit (HOSPITAL_COMMUNITY)
Admission: RE | Admit: 2017-10-19 | Discharge: 2017-10-19 | Disposition: A | Discharge status: Home / Self Care | Payer: PPO | Source: Ambulatory Visit | Attending: Internal Medicine | Admitting: Internal Medicine

## 2017-10-19 VITALS — BP 146/90 | HR 69 | Wt 262.8 lb

## 2017-10-19 DIAGNOSIS — Z66 Do not resuscitate: Secondary | ICD-10-CM | POA: Diagnosis not present

## 2017-10-19 DIAGNOSIS — J45909 Unspecified asthma, uncomplicated: Secondary | ICD-10-CM | POA: Insufficient documentation

## 2017-10-19 DIAGNOSIS — Z87891 Personal history of nicotine dependence: Secondary | ICD-10-CM | POA: Diagnosis not present

## 2017-10-19 DIAGNOSIS — I252 Old myocardial infarction: Secondary | ICD-10-CM | POA: Diagnosis not present

## 2017-10-19 DIAGNOSIS — I4892 Unspecified atrial flutter: Secondary | ICD-10-CM | POA: Insufficient documentation

## 2017-10-19 DIAGNOSIS — Z9981 Dependence on supplemental oxygen: Secondary | ICD-10-CM | POA: Insufficient documentation

## 2017-10-19 DIAGNOSIS — I130 Hypertensive heart and chronic kidney disease with heart failure and stage 1 through stage 4 chronic kidney disease, or unspecified chronic kidney disease: Secondary | ICD-10-CM | POA: Insufficient documentation

## 2017-10-19 DIAGNOSIS — K219 Gastro-esophageal reflux disease without esophagitis: Secondary | ICD-10-CM | POA: Diagnosis not present

## 2017-10-19 DIAGNOSIS — Z79890 Hormone replacement therapy (postmenopausal): Secondary | ICD-10-CM | POA: Diagnosis not present

## 2017-10-19 DIAGNOSIS — N184 Chronic kidney disease, stage 4 (severe): Secondary | ICD-10-CM | POA: Diagnosis not present

## 2017-10-19 DIAGNOSIS — I2510 Atherosclerotic heart disease of native coronary artery without angina pectoris: Secondary | ICD-10-CM | POA: Insufficient documentation

## 2017-10-19 DIAGNOSIS — I481 Persistent atrial fibrillation: Secondary | ICD-10-CM | POA: Insufficient documentation

## 2017-10-19 DIAGNOSIS — I495 Sick sinus syndrome: Secondary | ICD-10-CM | POA: Insufficient documentation

## 2017-10-19 DIAGNOSIS — E1122 Type 2 diabetes mellitus with diabetic chronic kidney disease: Secondary | ICD-10-CM | POA: Diagnosis not present

## 2017-10-19 DIAGNOSIS — Z794 Long term (current) use of insulin: Secondary | ICD-10-CM | POA: Insufficient documentation

## 2017-10-19 DIAGNOSIS — K0889 Other specified disorders of teeth and supporting structures: Secondary | ICD-10-CM | POA: Insufficient documentation

## 2017-10-19 DIAGNOSIS — J849 Interstitial pulmonary disease, unspecified: Secondary | ICD-10-CM | POA: Insufficient documentation

## 2017-10-19 DIAGNOSIS — Z7901 Long term (current) use of anticoagulants: Secondary | ICD-10-CM | POA: Insufficient documentation

## 2017-10-19 DIAGNOSIS — I480 Paroxysmal atrial fibrillation: Secondary | ICD-10-CM | POA: Diagnosis not present

## 2017-10-19 DIAGNOSIS — G4733 Obstructive sleep apnea (adult) (pediatric): Secondary | ICD-10-CM | POA: Diagnosis not present

## 2017-10-19 DIAGNOSIS — I10 Essential (primary) hypertension: Secondary | ICD-10-CM | POA: Diagnosis not present

## 2017-10-19 DIAGNOSIS — E785 Hyperlipidemia, unspecified: Secondary | ICD-10-CM | POA: Diagnosis not present

## 2017-10-19 DIAGNOSIS — I5032 Chronic diastolic (congestive) heart failure: Secondary | ICD-10-CM

## 2017-10-19 DIAGNOSIS — Z950 Presence of cardiac pacemaker: Secondary | ICD-10-CM | POA: Insufficient documentation

## 2017-10-19 DIAGNOSIS — E1151 Type 2 diabetes mellitus with diabetic peripheral angiopathy without gangrene: Secondary | ICD-10-CM | POA: Diagnosis not present

## 2017-10-19 DIAGNOSIS — Z79899 Other long term (current) drug therapy: Secondary | ICD-10-CM | POA: Insufficient documentation

## 2017-10-19 LAB — BASIC METABOLIC PANEL
Anion gap: 16 — ABNORMAL HIGH (ref 5–15)
BUN: 47 mg/dL — ABNORMAL HIGH (ref 6–20)
CO2: 24 mmol/L (ref 22–32)
Calcium: 9.6 mg/dL (ref 8.9–10.3)
Chloride: 102 mmol/L (ref 101–111)
Creatinine, Ser: 3.78 mg/dL — ABNORMAL HIGH (ref 0.44–1.00)
GFR calc Af Amer: 13 mL/min — ABNORMAL LOW (ref >60)
GFR calc non Af Amer: 11 mL/min — ABNORMAL LOW (ref >60)
Glucose, Bld: 180 mg/dL — ABNORMAL HIGH (ref 65–99)
Potassium: 3.8 mmol/L (ref 3.5–5.1)
Sodium: 142 mmol/L (ref 135–145)

## 2017-10-19 LAB — BRAIN NATRIURETIC PEPTIDE: B Natriuretic Peptide: 139.4 pg/mL — ABNORMAL HIGH (ref 0.0–100.0)

## 2017-10-19 NOTE — Patient Instructions (Signed)
Routine lab work today. Will notify you of abnormal results, otherwise no news is good news!  Follow up 4-6 weeks with Dr. Aundra Dubin.  __________________________________________________________ [name] Code: 9002  Take all medication as prescribed the day of your appointment. Bring all medications with you to your appointment.  Do the following things EVERYDAY: 1) Weigh yourself in the morning before breakfast. Write it down and keep it in a log. 2) Take your medicines as prescribed 3) Eat low salt foods-Limit salt (sodium) to 2000 mg per day.  4) Stay as active as you can everyday 5) Limit all fluids for the day to less than 2 liters

## 2017-10-19 NOTE — Progress Notes (Signed)
Patient ID: [name], female   DOB: 07/26/1946, 72 y.o.   MRN: [id number]   PCP: Dr. Alyson Ingles  Nephrologist: Dr. Florene Glen Cardiology: Dr Aundra Dubin  HPI: [name] is a 72 year-old woman with hypertension, hyperlipidemia, bronchial asthma, diabetes mellitus, CKD stage IV (baseline cr ~2.5), CAD, atrial fibrillation and diastolic HF.  Cath 5/12 with Promus DES to proximal LCx.  RCA was totally occluded with collaterals.   She was admitted in 4/15 for R TKR. That hospitalization complicated by a/c diastolic HF with respiratory distress requiring non-rebreather support. Weight on discharge was 226 pounds. ABG at that time 7.4/34/91/97%. Cr peaked at 2.9  Admitted 6/30-7/10/15 for SOB and CP following scheduled cardioversion. Found to have severe aspiration PNA and was intubated. She maintained SR for short period of time and then went back into Afib/Aflutter.  HR controlled on amio and diltiazem. Discharged to [facility] at a weight of 232 lbs.   Admitted 06/16/14 with symptomatic tachy/brady syndrome. 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 Discharge weight was 223 pounds.   Admitted 02/16/15 for symptomatic anemia s/p blood work from her pulmonologist.  Was diuresed with lasix while in hospital.   PFTs (6/16) with FVC 80%, FEV1 83%, ratio 103%, TLC 75%, DLCO 78% => restriction c/w interstitial process.  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   Holter (7/16) with rare PACs, PVCs; no atrial fibrillation.  After her CT showing interstitial fibrosis, she saw pulmonary. There was concern for connective tissue disease with lung involvement.  She has seen rheumatology and was told that she likely has rheumatoid arthritis.  The pulmonologist did not think that her ILD was due to amiodarone, so she is still on amiodarone.    Peripheral arterial dopplers in 6/18 showed 50-74% left mid SFA stenosis and 30-49%  right mid SFA stenosis.    Echo 03/21/17 EF 55-60% with moderate LVH, normal RV size and systolic function, mild MR.    S/p DCCV 03/30/17 with return to NSR.   She returns for HF follow up with her daughter. OLast visit metolazone was increased to 3 times a week but creatinine was elevated so metolazone was cut back to twice a week. She has follow up with Nephrology next week. Says she doesn't want to start dialysis. Overall feeling fair. Complaining of tooth pain. SOB with ADLs but this is her baseline.  Denies PND/Orthopnea. No chest pain. Tolerating CPAP part of the night.  No chest pain. Appetite ok. No fever or chills. Follows low salt diet. Her daughter says her balance has been an issue.  Weight at home 259 pounds. Taking all medications.   Labs: 11/16: K 3.5, creatinine 3.4, HCT 45.1 2/17: K 3, creatinine 3.22, LFTs normal, TSH normal, HCT 46.9 7/17: K 3.6, creatinine 3.6, HCT 43.3, LFTs normal, TSH normal 5/18: K 4.5, creatinine 4.07, TSH normal, LFTs normal, hgb 14.6 7/18: K 4.2, creatinine 4.27, LDL 52, TSH normal 10/18: K 4.1, creatinine 3.64 11/18: TSH normal, hgb 14.3, K 4, creatinine 4.38, LFTs normal 09/28/2017: K 3.8 Creatinine 4.48   Review of systems complete and found to be negative unless listed in HPI.    SH:  Social History   Socioeconomic History  . Marital status: Widowed    Spouse name: Not on file  . Number of children: 3  . Years of education: Not on file  . Highest education level: Not on file  Social Needs  . Financial  resource strain: Not on file  . Food insecurity - worry: Not on file  . Food insecurity - inability: Not on file  . Transportation needs - medical: Not on file  . Transportation needs - non-medical: Not on file  Occupational History  . Occupation: Disabled  Tobacco Use  . Smoking status: Former Smoker    Packs/day: 0.50    Years: 35.00    Pack years: 17.50    Types: Cigarettes    Last attempt to quit: 02/16/1995    Years since  quitting: 22.6  . Smokeless tobacco: Never Used  . Tobacco comment: 05/2014  QUIT OVER 20 YEARS AGO "  Substance and Sexual Activity  . Alcohol use: No    Alcohol/week: 0.0 oz  . Drug use: No  . Sexual activity: Not Currently  Other Topics Concern  . Not on file  Social History Narrative  . Not on file    FH:  Family History  Problem Relation Age of Onset  . Heart disease Mother   . Bladder Cancer Mother   . Kidney disease Mother   . Ovarian cancer Daughter   . Stomach cancer Maternal Uncle   . Colon cancer Maternal Aunt        dx in her 39's  . Esophageal cancer Neg Hx     Past Medical History:  Diagnosis Date  . Anginal pain ([place])    occ; non-ischemic Lexiscan 09/2012  . Arthritis   . Asthma   . Atrial flutter ([place])    ablated by Dr Lovena Le in 2008  . CKD (chronic kidney disease) 04/2007   CKD stage 4(Dr. Erling Cruz)  . Complication of anesthesia     DIFFICULTY BREATHING   . Coronary atherosclerosis of native coronary artery   . Diastolic heart failure 09/7614   grade 2 diastolic dysfunction per 0/7371 echo  . DNI (do not intubate)   . DNR (do not resuscitate)   . Esophageal dysmotility 2008   noted on esophagram.  hx dysphagia.   . Fatty liver 2008   noted on ultrasound 2008  . Fatty tumor fatty tumor back  . GERD (gastroesophageal reflux disease) 2012   Barrets esophagus on bx 2012 and 2014.   Marland Kitchen Gouty arthropathy   . Heart murmur   . Hyperlipidemia   . Hypertension   . 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   . Morbid obesity ([place])   . Myocardial infarction ([place]) 2009  . Peripheral vascular disease ([place])   . Persistent atrial fibrillation (HCC)    chads2 vasc score of at least 5  . Sick sinus syndrome ([place])   . Sleep apnea    wears CPAP   Current Outpatient Medications  Medication Sig Dispense Refill  . albuterol (PROVENTIL) (2.5 MG/3ML) 0.083% nebulizer solution Take 3 mLs (2.5 mg total) by nebulization every 6 (six) hours as  needed for shortness of breath. 75 mL 3  . amiodarone (PACERONE) 200 MG tablet Take 1 tablet (200 mg total) by mouth daily. 30 tablet 6  . amitriptyline (ELAVIL) 25 MG tablet Take 25 mg by mouth at bedtime.      Marland Kitchen atorvastatin (LIPITOR) 40 MG tablet TAKE ONE TABLET BY MOUTH ONCE DAILY 90 tablet 3  . dicyclomine (BENTYL) 10 MG capsule Take 10 mg by mouth daily.     Marland Kitchen diltiazem (CARTIA XT) 180 MG 24 hr capsule Take 1 capsule (180 mg total) daily by mouth. 30 capsule 3  . ELIQUIS  5 MG TABS tablet TAKE ONE TABLET BY MOUTH TWICE DAILY 60 tablet 6  . esomeprazole (NEXIUM) 40 MG capsule TAKE 1 CAPSULE BY MOUTH TWICE DAILY BEFORE MEAL(S) 60 capsule 1  . gabapentin (NEURONTIN) 300 MG capsule Take 300 mg by mouth 2 (two) times daily.    . hydrALAZINE (APRESOLINE) 25 MG tablet Take 1 tablet (25 mg total) 3 (three) times daily by mouth. 90 tablet 3  . insulin glargine (LANTUS) 100 UNIT/ML injection Inject 60 Units into the skin every morning.     . levalbuterol (XOPENEX) 0.31 MG/3ML nebulizer solution Take 1 ampule by nebulization every 4 (four) hours as needed for wheezing.    Marland Kitchen levothyroxine (SYNTHROID, LEVOTHROID) 25 MCG tablet Take 1 tablet by mouth daily.    Marland Kitchen LINZESS 290 MCG CAPS capsule     . metolazone (ZAROXOLYN) 2.5 MG tablet Take 1 tablet (2.5 mg total) by mouth 2 (two) times a week. Tuesday and Friday 8 tablet 6  . metoprolol tartrate (LOPRESSOR) 25 MG tablet TAKE 3 TABLETS BY MOUTH TWICE DAILY 180 tablet 6  . nitroGLYCERIN (NITROSTAT) 0.4 MG SL tablet Place 0.4 mg under the tongue every 5 (five) minutes as needed for chest pain (x 3 doses). Reported on 02/25/2016    . OXYGEN Inhale 3 L/min into the lungs continuous. Patient uses nightly    . potassium chloride SA (K-DUR,KLOR-CON) 20 MEQ tablet Take 20 mEq by mouth 4 (four) times daily.     . ranitidine (ZANTAC) 150 MG tablet Take 150 mg by mouth as needed for heartburn.    . torsemide (DEMADEX) 100 MG tablet Take 100 mg by mouth 2 (two) times  daily.    Marland Kitchen ULORIC 80 MG TABS Take 1 tablet by mouth daily.     . Vitamin D, Ergocalciferol, (DRISDOL) 50000 UNITS CAPS capsule Take 50,000 Units by mouth every Monday.    Penne Lash HFA 45 MCG/ACT inhaler Inhale 2 puffs into the lungs every 4 (four) hours as needed for wheezing or shortness of breath. 1 Inhaler 1  . zolpidem (AMBIEN) 10 MG tablet Take 10 mg by mouth at bedtime.     Marland Kitchen NOVOLOG FLEXPEN 100 UNIT/ML FlexPen     . RELION PEN NEEDLES 32G X 4 MM MISC      No current facility-administered medications for this encounter.     Vitals:   10/19/17 1025  BP: (!) 146/90  Pulse: 69  SpO2: 90%  Weight: 262 lb 12.8 oz (119.2 kg)   Filed Weights   10/19/17 1025  Weight: 262 lb 12.8 oz (119.2 kg)     PHYSICAL EXAM: General: Appears chronically ill. No resp difficulty. Arrived in a wheel chair.  HEENT: normal Neck: supple. JVP 8-9  Carotids 2+ bilat; no bruits. No lymphadenopathy or thryomegaly appreciated. Cor: PMI nondisplaced. Regular rate & rhythm. No rubs, gallops or murmurs. Lungs: clear on 3 liters oxygen.  Abdomen: obese, soft, nontender, nondistended. No hepatosplenomegaly. No bruits or masses. Good bowel sounds. Extremities: no cyanosis, clubbing, rash, RLE 1+ LLE trace  edema Neuro: alert & orientedx3, cranial nerves grossly intact. moves all 4 extremities w/o difficulty. Affect pleasant  ASSESSMENT & PLAN: 1) Chronic diastolic HF: Echo 2/70 with EF 55-60% with moderate LVH and normal RV.  NYHA III, dyspnea likely has a pulmonary component with restrictive PFTs from body habitus and ILD (NSIP pattern on high res chest CT).  Diuresis has been complicated by  CKD stage 4.   - Continue torsemide 100 mg BID  with K replacement.   - Continue metolazone twice a week. - Check BMET in 7 days.  - Reinforced low salt diet and limiting fluid intake to < 2 liters perr day.   2) Atrial fibrillation: A paced. - Regular pulse. Continue amiodarone, LFTs and TSH ok 09/14/2017   -  Continue Eliquis for anticoagulation.   3) CKD stage 4: Check BMET today. Follow up with Dr Florene Glen next week.  4) Tachy/Brady syndrome: s/p St Jude PPM.    5) OSA: Continue nightly CPAP. Encouraged nightly CPAP  6) Interstitial lung disease: Has ILD, may be RA-related.  Has seen Dr Amil Amen and follows with Dr Halford Chessman for pulmonology.   - Continue home oxygen.  7) Hyperlipidemia: Continue Lipitor, good lipids in 7/18.  8) CAD:  - no s/s chest pain.   - Continue statin.  - Continue Imdur. - No ASA with Eliquis use.  9) Tooth pain Refer to Dr Enrique Sack.   Follow up 4 weeks with Dr Aundra Dubin.   Darrick Grinder, NP  10/19/2017

## 2017-10-21 DIAGNOSIS — R0602 Shortness of breath: Secondary | ICD-10-CM | POA: Diagnosis not present

## 2017-10-26 DIAGNOSIS — I5030 Unspecified diastolic (congestive) heart failure: Secondary | ICD-10-CM | POA: Diagnosis not present

## 2017-10-26 DIAGNOSIS — N184 Chronic kidney disease, stage 4 (severe): Secondary | ICD-10-CM | POA: Diagnosis not present

## 2017-10-26 DIAGNOSIS — E8770 Fluid overload, unspecified: Secondary | ICD-10-CM | POA: Diagnosis not present

## 2017-10-28 DIAGNOSIS — I509 Heart failure, unspecified: Secondary | ICD-10-CM | POA: Diagnosis not present

## 2017-10-31 DIAGNOSIS — E039 Hypothyroidism, unspecified: Secondary | ICD-10-CM | POA: Diagnosis not present

## 2017-10-31 DIAGNOSIS — I5032 Chronic diastolic (congestive) heart failure: Secondary | ICD-10-CM | POA: Diagnosis not present

## 2017-10-31 DIAGNOSIS — E785 Hyperlipidemia, unspecified: Secondary | ICD-10-CM | POA: Diagnosis not present

## 2017-10-31 DIAGNOSIS — N184 Chronic kidney disease, stage 4 (severe): Secondary | ICD-10-CM | POA: Diagnosis not present

## 2017-10-31 DIAGNOSIS — E1165 Type 2 diabetes mellitus with hyperglycemia: Secondary | ICD-10-CM | POA: Diagnosis not present

## 2017-10-31 DIAGNOSIS — E1122 Type 2 diabetes mellitus with diabetic chronic kidney disease: Secondary | ICD-10-CM | POA: Diagnosis not present

## 2017-10-31 DIAGNOSIS — I10 Essential (primary) hypertension: Secondary | ICD-10-CM | POA: Diagnosis not present

## 2017-10-31 DIAGNOSIS — I4891 Unspecified atrial fibrillation: Secondary | ICD-10-CM | POA: Diagnosis not present

## 2017-10-31 DIAGNOSIS — E08621 Diabetes mellitus due to underlying condition with foot ulcer: Secondary | ICD-10-CM | POA: Diagnosis not present

## 2017-10-31 DIAGNOSIS — I2510 Atherosclerotic heart disease of native coronary artery without angina pectoris: Secondary | ICD-10-CM | POA: Diagnosis not present

## 2017-10-31 DIAGNOSIS — J449 Chronic obstructive pulmonary disease, unspecified: Secondary | ICD-10-CM | POA: Diagnosis not present

## 2017-10-31 DIAGNOSIS — E1142 Type 2 diabetes mellitus with diabetic polyneuropathy: Secondary | ICD-10-CM | POA: Diagnosis not present

## 2017-11-09 ENCOUNTER — Ambulatory Visit: Payer: PPO | Admitting: Neurology

## 2017-11-09 ENCOUNTER — Encounter: Payer: Self-pay | Admitting: Neurology

## 2017-11-09 ENCOUNTER — Ambulatory Visit (INDEPENDENT_AMBULATORY_CARE_PROVIDER_SITE_OTHER): Payer: PPO | Admitting: Neurology

## 2017-11-09 VITALS — BP 140/75 | HR 69 | Ht 64.0 in | Wt 256.5 lb

## 2017-11-09 DIAGNOSIS — R202 Paresthesia of skin: Secondary | ICD-10-CM | POA: Diagnosis not present

## 2017-11-09 DIAGNOSIS — M25511 Pain in right shoulder: Secondary | ICD-10-CM | POA: Diagnosis not present

## 2017-11-09 NOTE — Patient Instructions (Addendum)
[facility]    Address: [address], , [place] [address]  Phone: [phone]   

## 2017-11-09 NOTE — Progress Notes (Signed)
PATIENT: [name] DOB: 1946/06/26  Chief Complaint  Patient presents with  . New Patient (Initial Visit)    PCP: Dr. Ashby Dawes. Patient reports pain in her right elbow down to her hand with tingling and numbness at times.      HISTORICAL  [name] is a 72 year old female, seen in refer by her primary care doctor Merrilee Seashore, for evaluation of right hand paresthesia, initial evaluation was on November 09, 2017.  I reviewed and summarized the referring note, she has history of hyperlipidemia, hypothyroidism, on supplement, hypertension, diabetes, insulin-dependent, coronary artery disease, atrial flutter, status post ablation, obesity, obstructive sleep apnea, history of pacemaker, not MRI candidate, COPD, home oxygen dependent.  She has end-stage renal disease, is a dialysis candidate, but patient refused to have dialysis.  She has a gradual decline functional status over the past few years, rely on wheelchair for long distance since 2016, also has bilateral feet paresthesia for many years with diagnosis of diabetic peripheral neuropathy, gradually getting worse, now paresthesia is below knee level, since 2018, she noticed worsening right shoulder pain, radiating discomfort into the right arm, paresthesia of right index finger.   She has mild unsteady gait, just finished physical therapy, with limited help,  Laboratory evaluations in February 2019, mild elevated BNP 139, BMP showed creatinine 4.48, GFR of 9, normal TSH, CBC.  REVIEW OF SYSTEMS: Full 14 system review of systems performed and notable only for weight gain, fatigue, blurred vision, anemia, easy bleeding  ALLERGIES: Allergies  Allergen Reactions  . Adhesive [Tape] Itching    EKG leads  . Sulfa Antibiotics Itching and Nausea And Vomiting    everything I seen was red  . Imdur [Isosorbide Dinitrate] Other (See Comments)    headache  . Codeine Nausea And Vomiting  . Penicillins Nausea And Vomiting     HOME MEDICATIONS: Current Outpatient Medications  Medication Sig Dispense Refill  . albuterol (PROVENTIL) (2.5 MG/3ML) 0.083% nebulizer solution Take 3 mLs (2.5 mg total) by nebulization every 6 (six) hours as needed for shortness of breath. 75 mL 3  . amiodarone (PACERONE) 200 MG tablet Take 1 tablet (200 mg total) by mouth daily. 30 tablet 6  . amitriptyline (ELAVIL) 25 MG tablet Take 25 mg by mouth at bedtime.      Marland Kitchen atorvastatin (LIPITOR) 40 MG tablet TAKE ONE TABLET BY MOUTH ONCE DAILY 90 tablet 3  . clindamycin (CLEOCIN) 150 MG capsule     . dicyclomine (BENTYL) 10 MG capsule Take 10 mg by mouth daily.     Marland Kitchen diltiazem (CARTIA XT) 180 MG 24 hr capsule Take 1 capsule (180 mg total) daily by mouth. 30 capsule 3  . ELIQUIS 5 MG TABS tablet TAKE ONE TABLET BY MOUTH TWICE DAILY 60 tablet 6  . esomeprazole (NEXIUM) 40 MG capsule TAKE 1 CAPSULE BY MOUTH TWICE DAILY BEFORE MEAL(S) 60 capsule 1  . gabapentin (NEURONTIN) 300 MG capsule Take 300 mg by mouth 2 (two) times daily.    . hydrALAZINE (APRESOLINE) 25 MG tablet Take 1 tablet (25 mg total) 3 (three) times daily by mouth. 90 tablet 3  . insulin glargine (LANTUS) 100 UNIT/ML injection Inject 60 Units into the skin every morning.     . levalbuterol (XOPENEX) 0.31 MG/3ML nebulizer solution Take 1 ampule by nebulization every 4 (four) hours as needed for wheezing.    Marland Kitchen levothyroxine (SYNTHROID, LEVOTHROID) 25 MCG tablet Take 1 tablet by mouth daily.    Marland Kitchen LINZESS 290 MCG  CAPS capsule     . metolazone (ZAROXOLYN) 2.5 MG tablet Take 1 tablet (2.5 mg total) by mouth 2 (two) times a week. Tuesday and Friday 8 tablet 6  . metoprolol tartrate (LOPRESSOR) 25 MG tablet TAKE 3 TABLETS BY MOUTH TWICE DAILY 180 tablet 6  . nitroGLYCERIN (NITROSTAT) 0.4 MG SL tablet Place 0.4 mg under the tongue every 5 (five) minutes as needed for chest pain (x 3 doses). Reported on 02/25/2016    . NOVOLOG FLEXPEN 100 UNIT/ML FlexPen     . OXYGEN Inhale 3 L/min into the  lungs continuous. Patient uses nightly    . potassium chloride SA (K-DUR,KLOR-CON) 20 MEQ tablet Take 20 mEq by mouth 4 (four) times daily.     . ranitidine (ZANTAC) 150 MG tablet Take 150 mg by mouth as needed for heartburn.    Marland Kitchen RELION PEN NEEDLES 32G X 4 MM MISC     . torsemide (DEMADEX) 100 MG tablet Take 100 mg by mouth 2 (two) times daily.    . traMADol (ULTRAM) 50 MG tablet     . ULORIC 80 MG TABS Take 1 tablet by mouth daily.     . Vitamin D, Ergocalciferol, (DRISDOL) 50000 UNITS CAPS capsule Take 50,000 Units by mouth every Monday.    Penne Lash HFA 45 MCG/ACT inhaler Inhale 2 puffs into the lungs every 4 (four) hours as needed for wheezing or shortness of breath. 1 Inhaler 1  . zolpidem (AMBIEN) 10 MG tablet Take 10 mg by mouth at bedtime.      No current facility-administered medications for this visit.     PAST MEDICAL HISTORY: Past Medical History:  Diagnosis Date  . Anginal pain ([place])    occ; non-ischemic Lexiscan 09/2012  . Arthritis   . Asthma   . Atrial flutter ([place])    ablated by Dr Lovena Le in 2008  . CKD (chronic kidney disease) 04/2007   CKD stage 4(Dr. Erling Cruz)  . Complication of anesthesia     DIFFICULTY BREATHING   . Coronary atherosclerosis of native coronary artery   . Diastolic heart failure 04/1190   grade 2 diastolic dysfunction per 12/7827 echo  . DNI (do not intubate)   . DNR (do not resuscitate)   . Esophageal dysmotility 2008   noted on esophagram.  hx dysphagia.   . Fatty liver 2008   noted on ultrasound 2008  . Fatty tumor fatty tumor back  . GERD (gastroesophageal reflux disease) 2012   Barrets esophagus on bx 2012 and 2014.   Marland Kitchen Gouty arthropathy   . Heart murmur   . Hyperlipidemia   . Hypertension   . IDDM (insulin dependent diabetes mellitus) ([place])    type 2.   . Morbid obesity ([place])   . Myocardial infarction ([place]) 2009  . Peripheral vascular disease ([place])   . Persistent atrial fibrillation ([facility])    chads2 vasc score of at least 5   . Sick sinus syndrome ([place])   . Sleep apnea    wears CPAP    PAST SURGICAL HISTORY: Past Surgical History:  Procedure Laterality Date  . ATRIAL FLUTTER ABLATION  2008   CTI ablation by Dr Lovena Le  . BREAST LUMPECTOMY     right  . CARDIOVERSION N/A 03/03/2014   Procedure: CARDIOVERSION;  Surgeon: Laverda Page, MD;  Location: [place];  Service: Cardiovascular;  Laterality: N/A;  . CARDIOVERSION N/A 03/30/2017   Procedure: CARDIOVERSION;  Surgeon: Larey Dresser, MD;  Location[facility] ENDOSCOPY;  Service:  Cardiovascular;  Laterality: N/A;  . COLONOSCOPY WITH PROPOFOL N/A 02/18/2015   Procedure: COLONOSCOPY WITH PROPOFOL;  Surgeon: Milus Banister, MD;  Location: [place];  Service: Endoscopy;  Laterality: N/A;  . CORONARY ANGIOPLASTY WITH STENT PLACEMENT    . PACEMAKER INSERTION  06/18/14   STJ Assurity dual chamber pacemaker implanted by Dr Rayann Heman  . PERMANENT PACEMAKER INSERTION N/A 06/18/2014   SJM Assurity DR pacemaker implanted by Dr Rayann Heman for sick sinus syndrome  . RIGHT HEART CATHETERIZATION N/A 06/12/2014   Procedure: RIGHT HEART CATH;  Surgeon: Jolaine Artist, MD;  Location[facility] C[facility] LAB;  Service: Cardiovascular;  Laterality: N/A;  . TONSILLECTOMY    . TOTAL KNEE ARTHROPLASTY Right 12/15/2013   Procedure: RIGHT TOTAL KNEE ARTHROPLASTY;  Surgeon: Alta Corning, MD;  Location: [place];  Service: Orthopedics;  Laterality: Right;  . TUBAL LIGATION    . VIDEO BRONCHOSCOPY Bilateral 12/23/2015   Procedure: VIDEO BRONCHOSCOPY WITH FLUORO;  Surgeon: Chesley Mires, MD;  Location: [facility] ENDOSCOPY;  Service: Cardiopulmonary;  Laterality: Bilateral;    FAMILY HISTORY: Family History  Problem Relation Age of Onset  . Heart disease Mother   . Bladder Cancer Mother   . Kidney disease Mother   . Ovarian cancer Daughter   . Stomach cancer Maternal Uncle   . Colon cancer Maternal Aunt        dx in her 66's  . Esophageal cancer Neg Hx     SOCIAL HISTORY:  Social History    Socioeconomic History  . Marital status: Widowed    Spouse name: Not on file  . Number of children: 3  . Years of education: Not on file  . Highest education level: Not on file  Social Needs  . Financial resource strain: Not on file  . Food insecurity - worry: Not on file  . Food insecurity - inability: Not on file  . Transportation needs - medical: Not on file  . Transportation needs - non-medical: Not on file  Occupational History  . Occupation: Disabled  Tobacco Use  . Smoking status: Former Smoker    Packs/day: 0.50    Years: 35.00    Pack years: 17.50    Types: Cigarettes    Last attempt to quit: 02/16/1995    Years since quitting: 22.7  . Smokeless tobacco: Never Used  . Tobacco comment: 05/2014  QUIT OVER 20 YEARS AGO "  Substance and Sexual Activity  . Alcohol use: No    Alcohol/week: 0.0 oz  . Drug use: No  . Sexual activity: Not Currently  Other Topics Concern  . Not on file  Social History Narrative  . Not on file     PHYSICAL EXAM   Vitals:   11/09/17 0948  BP: 140/75  Pulse: 69  Weight: 256 lb 8 oz (116.3 kg)  Height: 5\' 4"  (1.626 m)    Not recorded      Body mass index is 44.03 kg/m.  PHYSICAL EXAMNIATION:  Gen: NAD, conversant, well nourised, obese, well groomed                     Cardiovascular: Regular rate rhythm, no peripheral edema, warm, nontender. Eyes: Conjunctivae clear without exudates or hemorrhage Neck: Supple, no carotid bruits. Pulmonary: Clear to auscultation bilaterally  Musculoskeletal:Significant right shoulder tenderness upon deep palpitation  NEUROLOGICAL EXAM:  MENTAL STATUS: Speech:    Speech is normal; fluent and spontaneous with normal comprehension.  Cognition:     Orientation to time,  place and person     Normal recent and remote memory     Normal Attention span and concentration     Normal Language, naming, repeating,spontaneous speech     Fund of knowledge   CRANIAL NERVES: CN II: Visual fields are  full to confrontation. Fundoscopic exam is normal with sharp discs and no vascular changes. Pupils are round equal and briskly reactive to light. CN III, IV, VI: extraocular movement are normal. No ptosis. CN V: Facial sensation is intact to pinprick in all 3 divisions bilaterally. Corneal responses are intact.  CN VII: Face is symmetric with normal eye closure and smile. CN VIII: Hearing is normal to rubbing fingers CN IX, X: Palate elevates symmetrically. Phonation is normal. CN XI: Head turning and shoulder shrug are intact CN XII: Tongue is midline with normal movements and no atrophy.  MOTOR: There is no pronator drift of out-stretched arms. Muscle bulk and tone are normal. Muscle strength is normal.  REFLEXES: Reflexes are 2+ and symmetric at the biceps, triceps, knees, and absent at ankles. Plantar responses are extensive bilaterally  SENSORY: Length dependent decreased light touch, pinprick, vibratory sensation to knee level, decreased to pinprick at first 3 finger pads, right worse than left,  COORDINATION: Rapid alternating movements and fine finger movements are intact. There is no dysmetria on finger-to-nose and heel-knee-shin.    GAIT/STANCE: She needs pushed up to get up from seated position, mildly antalgic,   DIAGNOSTIC DATA (LABS, IMAGING, TESTING) - I reviewed patient records, labs, notes, testing and imaging myself where available.   ASSESSMENT AND PLAN  [name] is a 72 y.o. female   Peripheral neuropathy, Gait abnormality Right shoulder pain, Right hand paresthesia  Most suggestive of right carpal tunnel syndromes, with superimposed right shoulder pathology,  She also has significant gait abnormality, likely multifactorial, including obesity, deconditioning, COPD, peripheral neuropathy, she was noted to have hyperreflexia on examinations, likely due to periventricular white matter disease, versus cervical spondylitic myelopathy, she is not a candidate  for MRI due to pacemaker,  Continue moderate exercise, she just finished physical therapy  EMG nerve conduction study  X-ray of right shoulder   Marcial Pacas, M.D. Ph.D.  [facility] [address] [place], [place] [address] Ph: [phone] Fax: [phone]  CC: Merrilee Seashore, MD

## 2017-11-18 ENCOUNTER — Other Ambulatory Visit (HOSPITAL_COMMUNITY): Payer: Self-pay | Admitting: Cardiology

## 2017-11-18 DIAGNOSIS — R0602 Shortness of breath: Secondary | ICD-10-CM | POA: Diagnosis not present

## 2017-11-23 DIAGNOSIS — H35363 Drusen (degenerative) of macula, bilateral: Secondary | ICD-10-CM | POA: Diagnosis not present

## 2017-11-23 DIAGNOSIS — H43813 Vitreous degeneration, bilateral: Secondary | ICD-10-CM | POA: Diagnosis not present

## 2017-11-23 DIAGNOSIS — H353131 Nonexudative age-related macular degeneration, bilateral, early dry stage: Secondary | ICD-10-CM | POA: Diagnosis not present

## 2017-11-23 DIAGNOSIS — H35033 Hypertensive retinopathy, bilateral: Secondary | ICD-10-CM | POA: Diagnosis not present

## 2017-11-25 DIAGNOSIS — I509 Heart failure, unspecified: Secondary | ICD-10-CM | POA: Diagnosis not present

## 2017-11-30 ENCOUNTER — Telehealth: Payer: Self-pay | Admitting: Physician Assistant

## 2017-11-30 ENCOUNTER — Other Ambulatory Visit: Payer: Self-pay

## 2017-11-30 ENCOUNTER — Ambulatory Visit (HOSPITAL_COMMUNITY)
Admission: RE | Admit: 2017-11-30 | Discharge: 2017-11-30 | Disposition: A | Discharge status: Home / Self Care | Payer: PPO | Source: Ambulatory Visit | Attending: Cardiology | Admitting: Cardiology

## 2017-11-30 ENCOUNTER — Encounter (HOSPITAL_COMMUNITY): Payer: Self-pay | Admitting: Cardiology

## 2017-11-30 VITALS — BP 131/75 | HR 70 | Wt 259.0 lb

## 2017-11-30 DIAGNOSIS — Z66 Do not resuscitate: Secondary | ICD-10-CM | POA: Diagnosis not present

## 2017-11-30 DIAGNOSIS — G4733 Obstructive sleep apnea (adult) (pediatric): Secondary | ICD-10-CM | POA: Diagnosis not present

## 2017-11-30 DIAGNOSIS — J45909 Unspecified asthma, uncomplicated: Secondary | ICD-10-CM | POA: Insufficient documentation

## 2017-11-30 DIAGNOSIS — Z794 Long term (current) use of insulin: Secondary | ICD-10-CM | POA: Insufficient documentation

## 2017-11-30 DIAGNOSIS — I5032 Chronic diastolic (congestive) heart failure: Secondary | ICD-10-CM | POA: Insufficient documentation

## 2017-11-30 DIAGNOSIS — E785 Hyperlipidemia, unspecified: Secondary | ICD-10-CM | POA: Insufficient documentation

## 2017-11-30 DIAGNOSIS — D649 Anemia, unspecified: Secondary | ICD-10-CM | POA: Diagnosis not present

## 2017-11-30 DIAGNOSIS — I4892 Unspecified atrial flutter: Secondary | ICD-10-CM | POA: Diagnosis not present

## 2017-11-30 DIAGNOSIS — Z87891 Personal history of nicotine dependence: Secondary | ICD-10-CM | POA: Insufficient documentation

## 2017-11-30 DIAGNOSIS — Z7901 Long term (current) use of anticoagulants: Secondary | ICD-10-CM | POA: Insufficient documentation

## 2017-11-30 DIAGNOSIS — K219 Gastro-esophageal reflux disease without esophagitis: Secondary | ICD-10-CM | POA: Insufficient documentation

## 2017-11-30 DIAGNOSIS — J849 Interstitial pulmonary disease, unspecified: Secondary | ICD-10-CM | POA: Insufficient documentation

## 2017-11-30 DIAGNOSIS — E1151 Type 2 diabetes mellitus with diabetic peripheral angiopathy without gangrene: Secondary | ICD-10-CM | POA: Insufficient documentation

## 2017-11-30 DIAGNOSIS — Z79899 Other long term (current) drug therapy: Secondary | ICD-10-CM | POA: Diagnosis not present

## 2017-11-30 DIAGNOSIS — E1122 Type 2 diabetes mellitus with diabetic chronic kidney disease: Secondary | ICD-10-CM | POA: Insufficient documentation

## 2017-11-30 DIAGNOSIS — I495 Sick sinus syndrome: Secondary | ICD-10-CM | POA: Insufficient documentation

## 2017-11-30 DIAGNOSIS — Z9981 Dependence on supplemental oxygen: Secondary | ICD-10-CM | POA: Insufficient documentation

## 2017-11-30 DIAGNOSIS — M109 Gout, unspecified: Secondary | ICD-10-CM | POA: Insufficient documentation

## 2017-11-30 DIAGNOSIS — N184 Chronic kidney disease, stage 4 (severe): Secondary | ICD-10-CM

## 2017-11-30 DIAGNOSIS — I4589 Other specified conduction disorders: Secondary | ICD-10-CM | POA: Insufficient documentation

## 2017-11-30 DIAGNOSIS — I252 Old myocardial infarction: Secondary | ICD-10-CM | POA: Insufficient documentation

## 2017-11-30 DIAGNOSIS — I481 Persistent atrial fibrillation: Secondary | ICD-10-CM | POA: Diagnosis not present

## 2017-11-30 DIAGNOSIS — I130 Hypertensive heart and chronic kidney disease with heart failure and stage 1 through stage 4 chronic kidney disease, or unspecified chronic kidney disease: Secondary | ICD-10-CM | POA: Insufficient documentation

## 2017-11-30 DIAGNOSIS — I480 Paroxysmal atrial fibrillation: Secondary | ICD-10-CM | POA: Insufficient documentation

## 2017-11-30 DIAGNOSIS — I2510 Atherosclerotic heart disease of native coronary artery without angina pectoris: Secondary | ICD-10-CM | POA: Insufficient documentation

## 2017-11-30 LAB — CBC
HCT: 42.2 % (ref 36.0–46.0)
Hemoglobin: 13.6 g/dL (ref 12.0–15.0)
MCH: 32.8 pg (ref 26.0–34.0)
MCHC: 32.2 g/dL (ref 30.0–36.0)
MCV: 101.7 fL — ABNORMAL HIGH (ref 78.0–100.0)
Platelets: 214 10*3/uL (ref 150–400)
RBC: 4.15 MIL/uL (ref 3.87–5.11)
RDW: 14.7 % (ref 11.5–15.5)
WBC: 6.6 10*3/uL (ref 4.0–10.5)

## 2017-11-30 LAB — BASIC METABOLIC PANEL
Anion gap: 14 (ref 5–15)
BUN: 49 mg/dL — ABNORMAL HIGH (ref 6–20)
CO2: 23 mmol/L (ref 22–32)
Calcium: 9.4 mg/dL (ref 8.9–10.3)
Chloride: 104 mmol/L (ref 101–111)
Creatinine, Ser: 4.08 mg/dL — ABNORMAL HIGH (ref 0.44–1.00)
GFR calc Af Amer: 12 mL/min — ABNORMAL LOW (ref >60)
GFR calc non Af Amer: 10 mL/min — ABNORMAL LOW (ref >60)
Glucose, Bld: 150 mg/dL — ABNORMAL HIGH (ref 65–99)
Potassium: 3.7 mmol/L (ref 3.5–5.1)
Sodium: 141 mmol/L (ref 135–145)

## 2017-11-30 NOTE — Telephone Encounter (Signed)
New Message   Pt calling and wants to know why  No one is providing the imaging to her. Please call

## 2017-11-30 NOTE — Patient Instructions (Signed)
Wear Compression Stockings  Labs drawn today (if we do not call you, then your lab work was stable)   Your physician recommends that you schedule a follow-up appointment in: 3 months July 2019 with Dr. Aundra Dubin  Please Call an Schedule Appointment

## 2017-11-30 NOTE — Telephone Encounter (Signed)
Spoke with pt and she said she spoke with someone about getting a different O2 tank because the current one is too heavy to carry.  Advised pt O2 was ordered through CHF clinic and I would send message to CHF team.  Pt appreciative for assistance.

## 2017-12-02 NOTE — Progress Notes (Signed)
Patient ID: [name], female   DOB: 05-06-1946, 72 y.o.   MRN: [id number]   PCP: Dr. Alyson Ingles  Nephrologist: Dr. Florene Glen Cardiology: Dr Aundra Dubin  HPI: [name] is a 72 year-old woman with hypertension, hyperlipidemia, bronchial asthma, diabetes mellitus, CKD stage IV (baseline cr ~2.5), CAD, atrial fibrillation and diastolic HF.  Cath 5/12 with Promus DES to proximal LCx.  RCA was totally occluded with collaterals.   She was admitted in 4/15 for R TKR. That hospitalization complicated by a/c diastolic HF with respiratory distress requiring non-rebreather support. Weight on discharge was 226 pounds. ABG at that time 7.4/34/91/97%. Cr peaked at 2.9  Admitted 6/30-7/10/15 for SOB and CP following scheduled cardioversion. Found to have severe aspiration PNA and was intubated. She maintained SR for short period of time and then went back into Afib/Aflutter.  HR controlled on amio and diltiazem. Discharged to [facility] at a weight of 232 lbs.   Admitted 06/16/14 with symptomatic tachy/brady syndrome. 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 Discharge weight was 223 pounds.   Admitted 02/16/15 for symptomatic anemia s/p blood work from her pulmonologist.  Was diuresed with lasix while in hospital.   PFTs (6/16) with FVC 80%, FEV1 83%, ratio 103%, TLC 75%, DLCO 78% => restriction c/w interstitial process.  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   Holter (7/16) with rare PACs, PVCs; no atrial fibrillation.  After her CT showing interstitial fibrosis, she saw pulmonary. There was concern for connective tissue disease with lung involvement.  She has seen rheumatology and was told that she likely has rheumatoid arthritis.  The pulmonologist did not think that her ILD was due to amiodarone, so she is still on amiodarone.    Peripheral arterial dopplers in 6/18 showed 50-74% left mid SFA stenosis and 30-49%  right mid SFA stenosis.    Echo 03/21/17 EF 55-60% with moderate LVH, normal RV size and systolic function, mild MR.    S/p DCCV 03/30/17 with return to NSR.   She is seen today in followup of diastolic CHF and paroxysmal atrial fibrillation.  She remains out of atrial fibrillation (a-paced).  Not very active because of arthritis pain.  Hard to walk because of pain, uses walker.  No chest pain.  No lightheadedness or falls.  Occasional red blood noted on stool, has history of hemorrhoids.  No melena.  Very short of breath walking up stairs.  Generally, breathing is ok walking on flat ground.  Weight down 3 lbs.   ECG (personally reviewed): a-paced, nonspecific T wave flattening.    Labs: 11/16: K 3.5, creatinine 3.4, HCT 45.1 2/17: K 3, creatinine 3.22, LFTs normal, TSH normal, HCT 46.9 7/17: K 3.6, creatinine 3.6, HCT 43.3, LFTs normal, TSH normal 5/18: K 4.5, creatinine 4.07, TSH normal, LFTs normal, hgb 14.6 7/18: K 4.2, creatinine 4.27, LDL 52, TSH normal 10/18: K 4.1, creatinine 3.64 11/18: TSH normal, hgb 14.3, K 4, creatinine 4.38, LFTs normal 2/19: BNP 139, K 3.8, creatinine 3.78, LFTs normal, TSH normal  Review of systems complete and found to be negative unless listed in HPI.    SH:  Social History   Socioeconomic History  . Marital status: Widowed    Spouse name: Not on file  . Number of children: 3  . Years of education: Not on file  . Highest education level: Not on file  Occupational History  . Occupation: Disabled  Social Needs  . Financial  resource strain: Not on file  . Food insecurity:    Worry: Not on file    Inability: Not on file  . Transportation needs:    Medical: Not on file    Non-medical: Not on file  Tobacco Use  . Smoking status: Former Smoker    Packs/day: 0.50    Years: 35.00    Pack years: 17.50    Types: Cigarettes    Last attempt to quit: 02/16/1995    Years since quitting: 22.8  . Smokeless tobacco: Never Used  . Tobacco comment: 05/2014   QUIT OVER 20 YEARS AGO "  Substance and Sexual Activity  . Alcohol use: No    Alcohol/week: 0.0 oz  . Drug use: No  . Sexual activity: Not Currently  Lifestyle  . Physical activity:    Days per week: Not on file    Minutes per session: Not on file  . Stress: Not on file  Relationships  . Social connections:    Talks on phone: Not on file    Gets together: Not on file    Attends religious service: Not on file    Active member of club or organization: Not on file    Attends meetings of clubs or organizations: Not on file    Relationship status: Not on file  . Intimate partner violence:    Fear of current or ex partner: Not on file    Emotionally abused: Not on file    Physically abused: Not on file    Forced sexual activity: Not on file  Other Topics Concern  . Not on file  Social History Narrative  . Not on file    FH:  Family History  Problem Relation Age of Onset  . Heart disease Mother   . Bladder Cancer Mother   . Kidney disease Mother   . Ovarian cancer Daughter   . Stomach cancer Maternal Uncle   . Colon cancer Maternal Aunt        dx in her 62's  . Esophageal cancer Neg Hx     Past Medical History:  Diagnosis Date  . Anginal pain ([place])    occ; non-ischemic Lexiscan 09/2012  . Arthritis   . Asthma   . Atrial flutter ([place])    ablated by Dr Lovena Le in 2008  . CKD (chronic kidney disease) 04/2007   CKD stage 4(Dr. Erling Cruz)  . Complication of anesthesia     DIFFICULTY BREATHING   . Coronary atherosclerosis of native coronary artery   . Diastolic heart failure 05/8920   grade 2 diastolic dysfunction per 09/9415 echo  . DNI (do not intubate)   . DNR (do not resuscitate)   . Esophageal dysmotility 2008   noted on esophagram.  hx dysphagia.   . Fatty liver 2008   noted on ultrasound 2008  . Fatty tumor fatty tumor back  . GERD (gastroesophageal reflux disease) 2012   Barrets esophagus on bx 2012 and 2014.   Marland Kitchen Gouty arthropathy   . Heart murmur   .  Hyperlipidemia   . Hypertension   . IDDM (insulin dependent diabetes mellitus) ([place])    type 2.   . Morbid obesity ([place])   . Myocardial infarction ([place]) 2009  . Peripheral vascular disease ([place])   . Persistent atrial fibrillation ([facility])    chads2 vasc score of at least 5  . Sick sinus syndrome ([place])   . Sleep apnea    wears CPAP   Current Outpatient Medications  Medication Sig Dispense Refill  . albuterol (PROVENTIL) (2.5 MG/3ML) 0.083% nebulizer solution Take 3 mLs (2.5 mg total) by nebulization every 6 (six) hours as needed for shortness of breath. 75 mL 3  . amiodarone (PACERONE) 200 MG tablet TAKE 1 TABLET BY MOUTH ONCE DAILY 30 tablet 6  . amitriptyline (ELAVIL) 25 MG tablet Take 25 mg by mouth at bedtime.      Marland Kitchen atorvastatin (LIPITOR) 40 MG tablet TAKE ONE TABLET BY MOUTH ONCE DAILY 90 tablet 3  . dicyclomine (BENTYL) 10 MG capsule Take 10 mg by mouth daily.     Marland Kitchen diltiazem (CARTIA XT) 180 MG 24 hr capsule Take 1 capsule (180 mg total) daily by mouth. 30 capsule 3  . ELIQUIS 5 MG TABS tablet TAKE ONE TABLET BY MOUTH TWICE DAILY 60 tablet 6  . esomeprazole (NEXIUM) 40 MG capsule TAKE 1 CAPSULE BY MOUTH TWICE DAILY BEFORE MEAL(S) 60 capsule 1  . gabapentin (NEURONTIN) 300 MG capsule Take 300 mg by mouth 2 (two) times daily.    . hydrALAZINE (APRESOLINE) 25 MG tablet Take 1 tablet (25 mg total) 3 (three) times daily by mouth. 90 tablet 3  . insulin glargine (LANTUS) 100 UNIT/ML injection Inject 60 Units into the skin every morning.     . levalbuterol (XOPENEX) 0.31 MG/3ML nebulizer solution Take 1 ampule by nebulization every 4 (four) hours as needed for wheezing.    Marland Kitchen levothyroxine (SYNTHROID, LEVOTHROID) 25 MCG tablet Take 1 tablet by mouth daily.    Marland Kitchen LINZESS 290 MCG CAPS capsule     . metolazone (ZAROXOLYN) 2.5 MG tablet Take 1 tablet (2.5 mg total) by mouth 2 (two) times a week. Tuesday and Friday 8 tablet 6  . metoprolol tartrate (LOPRESSOR) 25 MG tablet TAKE 3 TABLETS BY MOUTH  TWICE DAILY 180 tablet 6  . nitroGLYCERIN (NITROSTAT) 0.4 MG SL tablet Place 0.4 mg under the tongue every 5 (five) minutes as needed for chest pain (x 3 doses). Reported on 02/25/2016    . NOVOLOG FLEXPEN 100 UNIT/ML FlexPen     . OXYGEN Inhale 3 L/min into the lungs continuous. Patient uses nightly    . potassium chloride SA (K-DUR,KLOR-CON) 20 MEQ tablet Take 20 mEq by mouth 4 (four) times daily.     . ranitidine (ZANTAC) 150 MG tablet Take 150 mg by mouth as needed for heartburn.    Marland Kitchen RELION PEN NEEDLES 32G X 4 MM MISC     . torsemide (DEMADEX) 100 MG tablet Take 100 mg by mouth 2 (two) times daily.    . traMADol (ULTRAM) 50 MG tablet     . ULORIC 80 MG TABS Take 1 tablet by mouth daily.     . Vitamin D, Ergocalciferol, (DRISDOL) 50000 UNITS CAPS capsule Take 50,000 Units by mouth every Monday.    Penne Lash HFA 45 MCG/ACT inhaler Inhale 2 puffs into the lungs every 4 (four) hours as needed for wheezing or shortness of breath. 1 Inhaler 1  . zolpidem (AMBIEN) 10 MG tablet Take 10 mg by mouth at bedtime.      No current facility-administered medications for this encounter.     Vitals:   11/30/17 1059  BP: 131/75  Pulse: 70  SpO2: 94%  Weight: 259 lb (117.5 kg)    PHYSICAL EXAM: General: NAD Neck: Thick, no JVD, no thyromegaly or thyroid nodule.  Lungs: Clear to auscultation bilaterally with normal respiratory effort. CV: Nondisplaced PMI.  Heart regular S1/S2, no S3/S4, no murmur.  1+  ankle edema.  No carotid bruit.  Normal pedal pulses.  Abdomen: Soft, nontender, no hepatosplenomegaly, no distention.  Skin: Intact without lesions or rashes.  Neurologic: Alert and oriented x 3.  Psych: Normal affect. Extremities: No clubbing or cyanosis.  HEENT: Normal.   ASSESSMENT & PLAN:  1) Chronic diastolic HF: Echo 4/58 with EF 55-60% with moderate LVH and normal RV.  NYHA III, dyspnea likely has a pulmonary component with restrictive PFTs from body habitus and ILD (NSIP pattern on high  resolution chest CT).  Weight is down, she does not look volume overloaded on exam.  - Continue torsemide 100 mg BID with K replacement.   - Continue metolazone twice a week.  - BMET today.  - Wear compression stockings.  2) Atrial fibrillation: A paced. - Continue amiodarone.  Recent LFTs/TSH normal.  He will need regular eye exam.  - Continue Eliquis for anticoagulation.  CBC today.  3) CKD stage 4: BMET today 4) Tachy/Brady syndrome: s/p St Jude PPM.    5) OSA: Encouraged nightly CPAP  6) Interstitial lung disease: Has ILD, may be RA-related.  Has seen Dr Amil Amen and follows with Dr Halford Chessman for pulmonology.   - Continue home oxygen.  7) Hyperlipidemia: Continue Lipitor, good lipids in 7/18.  8) CAD: She is on Imdur now, no chest pain.   - Continue statin.  - Continue Imdur. - No ASA with Eliquis use.   Followup in 3 months.     [name], MD  12/02/2017

## 2017-12-03 NOTE — Telephone Encounter (Signed)
Orders were signed and faxed to [employer] a few weeks back. At patients last office visit this was discussed with her again her insurance will not cover [employer].

## 2017-12-04 ENCOUNTER — Other Ambulatory Visit (HOSPITAL_COMMUNITY): Payer: Self-pay | Admitting: Cardiology

## 2017-12-06 ENCOUNTER — Telehealth: Payer: Self-pay | Admitting: Cardiology

## 2017-12-06 ENCOUNTER — Encounter: Payer: PPO | Admitting: *Deleted

## 2017-12-06 DIAGNOSIS — M150 Primary generalized (osteo)arthritis: Secondary | ICD-10-CM | POA: Diagnosis not present

## 2017-12-06 DIAGNOSIS — M25562 Pain in left knee: Secondary | ICD-10-CM | POA: Diagnosis not present

## 2017-12-06 DIAGNOSIS — J849 Interstitial pulmonary disease, unspecified: Secondary | ICD-10-CM | POA: Diagnosis not present

## 2017-12-06 DIAGNOSIS — M2550 Pain in unspecified joint: Secondary | ICD-10-CM | POA: Diagnosis not present

## 2017-12-06 DIAGNOSIS — M1A09X0 Idiopathic chronic gout, multiple sites, without tophus (tophi): Secondary | ICD-10-CM | POA: Diagnosis not present

## 2017-12-06 DIAGNOSIS — N184 Chronic kidney disease, stage 4 (severe): Secondary | ICD-10-CM | POA: Diagnosis not present

## 2017-12-06 NOTE — Telephone Encounter (Signed)
LMOVM reminding pt to send remote transmission.   

## 2017-12-07 ENCOUNTER — Ambulatory Visit (INDEPENDENT_AMBULATORY_CARE_PROVIDER_SITE_OTHER): Payer: PPO | Admitting: Neurology

## 2017-12-07 ENCOUNTER — Encounter (INDEPENDENT_AMBULATORY_CARE_PROVIDER_SITE_OTHER): Payer: PPO | Admitting: Neurology

## 2017-12-07 DIAGNOSIS — R202 Paresthesia of skin: Secondary | ICD-10-CM | POA: Diagnosis not present

## 2017-12-07 DIAGNOSIS — Z0289 Encounter for other administrative examinations: Secondary | ICD-10-CM

## 2017-12-07 NOTE — Procedures (Signed)
Full Name: [name] Gender: Female MRN #: [id number] Date of Birth: Feb 15, 1946    Visit Date: 12/07/2017 09:12 Age: 72 Years 55 Months Old Examining Physician: Marcial Pacas, MD  Referring Physician: Krista Blue, MD History: 72 year old female, with history of COPD, oxygen dependent, obesity, right knee replacement, diabetic peripheral neuropathy, presenting with gradual worsening gait abnormality.  Summary of the tests:  Nerve conduction study: Right sural, superficial peroneal sensory responses were absent.  Right tibial motor responses showed mildly decreased conduction velocity, right peroneal to EDB motor response showed mildly decreased the C map amplitude and mildly decreased conduction velocity.  Bilateral median sensory responses showed mildly prolonged peak latency.  Bilateral median motor responses which were within normal limits.  Bilateral ulnar sensory and motor responses were normal.  Electromyography: Selective needle examinations were performed at right lower extremity, upper extremity muscles.  Right abductor hallucis, increased insertional activity, mildly decreased recruitment.  Rest of the needle examination showed no significant abnormalities.    Conclusion: This is an abnormal study.  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  In addition, there is evidence of mild bilateral carpal tunnel syndromes.    ------------------------------- Marcial Pacas, M.D.  [facility] [place], [place] [address] Tel: [phone] Fax: [phone]        [facility]    Nerve / Sites Muscle Latency Ref. Amplitude Ref. Rel Amp Segments Distance Velocity Ref. Area    ms ms mV mV %  cm m/s m/s mVms  R Median - APB     Wrist APB 4.3 ?4.4 7.9 ?4.0 100 Wrist - APB 7   20.8     Upper arm APB 8.9  6.9  87.4 Upper arm - Wrist 23 50 ?49 20.6  L Median - APB       Wrist APB 4.0 ?4.4 7.3 ?4.0 100 Wrist - APB 7   19.0     Upper arm APB 8.5  6.6  90.2 Upper arm - Wrist 23 51 ?49 17.1  R Ulnar - ADM     Wrist ADM 3.2 ?3.3 11.8 ?6.0 100 Wrist - ADM 7   32.6     B.Elbow ADM 7.2  11.1  94 B.Elbow - Wrist 20 49 ?49 28.4     A.Elbow ADM 10.4  10.5  94.6 A.Elbow - B.Elbow 12 38 ?49 28.1         A.Elbow - Wrist      L Ulnar - ADM     Wrist ADM 3.2 ?3.3 11.9 ?6.0 100 Wrist - ADM 7   34.6     B.Elbow ADM 7.1  10.7  90 B.Elbow - Wrist 20 51 ?49 34.8     A.Elbow ADM 9.6  10.4  97.8 A.Elbow - B.Elbow 12 49 ?49 33.5         A.Elbow - Wrist      R Peroneal - EDB     Ankle EDB 5.4 ?6.5 1.9 ?2.0 100 Ankle - EDB 9   6.1     Fib head EDB 13.3  1.3  71.2 Fib head - Ankle 31 39 ?44 5.2     Pop fossa EDB 15.4  1.0  73 Pop fossa - Fib head 8 38 ?44 3.3         Pop fossa - Ankle      R Tibial - AH     Ankle  AH 4.5 ?5.8 5.2 ?4.0 100 Ankle - AH 9   15.0     Pop fossa AH 14.5  4.2  80.4 Pop fossa - Ankle 37 37 ?41 10.0                 SNC    Nerve / Sites Rec. Site Peak Lat Ref.  Amp Ref. Segments Distance Peak Diff Ref.    ms ms V V  cm ms ms  R Sural - Ankle (Calf)     Calf Ankle NR ?4.4 NR ?6 Calf - Ankle 14    R Superficial peroneal - Ankle     Lat leg Ankle NR ?4.4 NR ?6 Lat leg - Ankle 14    R Median, Ulnar - Transcarpal comparison     Median Palm Wrist 2.8 ?2.2 25 ?35 Median Palm - Wrist 8       Ulnar Palm Wrist 2.1 ?2.2 6 ?12 Ulnar Palm - Wrist 8          Median Palm - Ulnar Palm  0.6 ?0.4  L Median, Ulnar - Transcarpal comparison     Median Palm Wrist 2.7 ?2.2 15 ?35 Median Palm - Wrist 8       Ulnar Palm Wrist 2.2 ?2.2 7 ?12 Ulnar Palm - Wrist 8          Median Palm - Ulnar Palm  0.5 ?0.4  R Median - Orthodromic (Dig II, Mid palm)     Dig II Wrist 4.0 ?3.4 10 ?10 Dig II - Wrist 13    L Median - Orthodromic (Dig II, Mid palm)     Dig II Wrist 3.6 ?3.4 10 ?10 Dig II - Wrist 13    R Ulnar - Orthodromic, (Dig V, Mid palm)     Dig V Wrist 2.9 ?3.1 5 ?5  Dig V - Wrist 11    L Ulnar - Orthodromic, (Dig V, Mid palm)     Dig V Wrist 3.1 ?3.1 8 ?5 Dig V - Wrist 60                       F  Wave    Nerve F Lat Ref.   ms ms  R Ulnar - ADM 33.5 ?32.0  R Tibial - AH 60.6 ?56.0         EMG full       EMG Summary Table    Spontaneous MUAP Recruitment  Muscle IA Fib PSW Fasc Other Amp Dur. Poly Pattern  R. First dorsal interosseous Normal None None None _______ Normal Normal Normal Reduced  R. Flexor carpi ulnaris Normal None None None _______ Normal Normal Normal Normal  R. Biceps brachii Normal None None None _______ Normal Normal Normal Normal  R. Deltoid Normal None None None _______ Normal Normal Normal Normal  R. Triceps brachii Normal None None None _______ Normal Normal Normal Normal  R. Extensor digitorum communis Normal None None None _______ Normal Normal Normal Normal  R. Tibialis anterior Normal None None None _______ Normal Normal Normal Normal  R. Peroneus longus Normal None None None _______ Normal Normal Normal Normal  R. Gastrocnemius (Medial head) Normal None None None _______ Normal Normal Normal Normal  R. Tibialis posterior Normal None None None _______ Normal Normal Normal Normal  R. Hallucis increased None None None ------------ Normal Normal  Normal Mildly decreased  R. Vastus lateralis Normal None None None _______ Normal Normal Normal Normal

## 2017-12-11 ENCOUNTER — Ambulatory Visit (INDEPENDENT_AMBULATORY_CARE_PROVIDER_SITE_OTHER): Payer: PPO | Admitting: *Deleted

## 2017-12-11 DIAGNOSIS — I495 Sick sinus syndrome: Secondary | ICD-10-CM | POA: Diagnosis not present

## 2017-12-11 NOTE — Progress Notes (Signed)
Remote pacemaker transmission.   

## 2017-12-13 ENCOUNTER — Encounter: Payer: Self-pay | Admitting: Cardiology

## 2017-12-19 DIAGNOSIS — R0602 Shortness of breath: Secondary | ICD-10-CM | POA: Diagnosis not present

## 2017-12-26 DIAGNOSIS — I509 Heart failure, unspecified: Secondary | ICD-10-CM | POA: Diagnosis not present

## 2017-12-28 ENCOUNTER — Encounter: Payer: Self-pay | Admitting: Internal Medicine

## 2018-01-04 LAB — CUP PACEART REMOTE DEVICE CHECK
Battery Remaining Longevity: 106 mo
Battery Remaining Percentage: 95.5 %
Battery Voltage: 2.99 V
Brady Statistic AP VP Percent: 13 %
Brady Statistic AP VS Percent: 86 %
Brady Statistic AS VP Percent: 1 %
Brady Statistic AS VS Percent: 1 %
Brady Statistic RA Percent Paced: 98 %
Brady Statistic RV Percent Paced: 13 %
Date Time Interrogation Session: 20190409065216
Implantable Lead Implant Date: 20151015
Implantable Lead Implant Date: 20151015
Implantable Lead Location: 753859
Implantable Lead Location: 753860
Implantable Lead Model: 1948
Implantable Pulse Generator Implant Date: 20151015
Lead Channel Impedance Value: 460 Ohm
Lead Channel Impedance Value: 580 Ohm
Lead Channel Pacing Threshold Amplitude: 0.75 V
Lead Channel Pacing Threshold Amplitude: 1.25 V
Lead Channel Pacing Threshold Pulse Width: 0.5 ms
Lead Channel Pacing Threshold Pulse Width: 0.5 ms
Lead Channel Sensing Intrinsic Amplitude: 12 mV
Lead Channel Sensing Intrinsic Amplitude: 5 mV
Lead Channel Setting Pacing Amplitude: 2 V
Lead Channel Setting Pacing Amplitude: 2.5 V
Lead Channel Setting Pacing Pulse Width: 0.5 ms
Lead Channel Setting Sensing Sensitivity: 2 mV
Pulse Gen Model: 2240
Pulse Gen Serial Number: 7665001

## 2018-01-16 ENCOUNTER — Encounter: Payer: Self-pay | Admitting: Internal Medicine

## 2018-01-16 ENCOUNTER — Ambulatory Visit (INDEPENDENT_AMBULATORY_CARE_PROVIDER_SITE_OTHER): Payer: PPO | Admitting: Internal Medicine

## 2018-01-16 VITALS — BP 124/72 | HR 70 | Ht 64.0 in | Wt 257.0 lb

## 2018-01-16 DIAGNOSIS — I480 Paroxysmal atrial fibrillation: Secondary | ICD-10-CM

## 2018-01-16 DIAGNOSIS — Z950 Presence of cardiac pacemaker: Secondary | ICD-10-CM | POA: Diagnosis not present

## 2018-01-16 DIAGNOSIS — I495 Sick sinus syndrome: Secondary | ICD-10-CM

## 2018-01-16 DIAGNOSIS — I10 Essential (primary) hypertension: Secondary | ICD-10-CM | POA: Diagnosis not present

## 2018-01-16 NOTE — Patient Instructions (Addendum)
Medication Instructions:  Your physician recommends that you continue on your current medications as directed. Please refer to the Current Medication list given to you today.  Labwork: None ordered.  Testing/Procedures: None ordered.  Follow-Up: Your physician wants you to follow-up in: 12 months with Tommye Standard, PA.   You will receive a reminder letter in the mail two months in advance. If you don't receive a letter, please call our office to schedule the follow-up appointment.  Remote monitoring is used to monitor your Pacemaker from home. This monitoring reduces the number of office visits required to check your device to one time per year. It allows [place] to keep an eye on the functioning of your device to ensure it is working properly. You are scheduled for a device check from home on 03/12/2018. You may send your transmission at any time that day. If you have a wireless device, the transmission will be sent automatically. After your physician reviews your transmission, you will receive a postcard with your next transmission date.  Any Other Special Instructions Will Be Listed Below (If Applicable).  If you need a refill on your cardiac medications before your next appointment, please call your pharmacy.

## 2018-01-16 NOTE — Progress Notes (Signed)
PCP: Merrilee Seashore, MD Primary Cardiologist: Dr Aundra Dubin Primary EP:  Dr Rutherford Limerick is a 72 y.o. female who presents today for routine electrophysiology followup.  Since last being seen in our clinic, the patient reports doing reasonably well.  She wears O2 chronically.  SOB is at baseline.  Today, she denies symptoms of palpitations, chest pain, lower extremity edema, dizziness, presyncope, or syncope.  The patient is otherwise without complaint today.   Past Medical History:  Diagnosis Date  . Anginal pain ([place])    occ; non-ischemic Lexiscan 09/2012  . Arthritis   . Asthma   . Atrial flutter ([place])    ablated by Dr Lovena Le in 2008  . CKD (chronic kidney disease) 04/2007   CKD stage 4(Dr. Erling Cruz)  . Complication of anesthesia     DIFFICULTY BREATHING   . Coronary atherosclerosis of native coronary artery   . Diastolic heart failure 01/8098   grade 2 diastolic dysfunction per 04/3381 echo  . DNI (do not intubate)   . DNR (do not resuscitate)   . Esophageal dysmotility 2008   noted on esophagram.  hx dysphagia.   . Fatty liver 2008   noted on ultrasound 2008  . Fatty tumor fatty tumor back  . GERD (gastroesophageal reflux disease) 2012   Barrets esophagus on bx 2012 and 2014.   Marland Kitchen Gouty arthropathy   . Heart murmur   . Hyperlipidemia   . Hypertension   . 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   . Morbid obesity ([place])   . Myocardial infarction ([place]) 2009  . Peripheral vascular disease ([place])   . Persistent atrial fibrillation ([facility])    chads2 vasc score of at least 5  . Sick sinus syndrome ([place])   . Sleep apnea    wears CPAP   Past Surgical History:  Procedure Laterality Date  . ATRIAL FLUTTER ABLATION  2008   CTI ablation by Dr Lovena Le  . BREAST LUMPECTOMY     right  . CARDIOVERSION N/A 03/03/2014   Procedure: CARDIOVERSION;  Surgeon: Laverda Page, MD;  Location: [facility];  Service: Cardiovascular;  Laterality: N/A;  .  CARDIOVERSION N/A 03/30/2017   Procedure: CARDIOVERSION;  Surgeon: Larey Dresser, MD;  Location[facility] ENDOSCOPY;  Service: Cardiovascular;  Laterality: N/A;  . COLONOSCOPY WITH PROPOFOL N/A 02/18/2015   Procedure: COLONOSCOPY WITH PROPOFOL;  Surgeon: Milus Banister, MD;  Location: [facility];  Service: Endoscopy;  Laterality: N/A;  . CORONARY ANGIOPLASTY WITH STENT PLACEMENT    . PACEMAKER INSERTION  06/18/14   STJ Assurity dual chamber pacemaker implanted by Dr Rayann Heman  . PERMANENT PACEMAKER INSERTION N/A 06/18/2014   SJM Assurity DR pacemaker implanted by Dr Rayann Heman for sick sinus syndrome  . RIGHT HEART CATHETERIZATION N/A 06/12/2014   Procedure: RIGHT HEART CATH;  Surgeon: Jolaine Artist, MD;  Location: [facility] CATH LAB;  Service: Cardiovascular;  Laterality: N/A;  . TONSILLECTOMY    . TOTAL KNEE ARTHROPLASTY Right 12/15/2013   Procedure: RIGHT TOTAL KNEE ARTHROPLASTY;  Surgeon: Alta Corning, MD;  Location: [name];  Service: Orthopedics;  Laterality: Right;  . TUBAL LIGATION    . VIDEO BRONCHOSCOPY Bilateral 12/23/2015   Procedure: VIDEO BRONCHOSCOPY WITH FLUORO;  Surgeon: Chesley Mires, MD;  Location: [facility] ENDOSCOPY;  Service: Cardiopulmonary;  Laterality: Bilateral;    ROS- all systems are reviewed and negative except as per HPI above  Current Outpatient Medications  Medication Sig Dispense Refill  .  albuterol (PROVENTIL) (2.5 MG/3ML) 0.083% nebulizer solution Take 3 mLs (2.5 mg total) by nebulization every 6 (six) hours as needed for shortness of breath. 75 mL 3  . amiodarone (PACERONE) 200 MG tablet TAKE 1 TABLET BY MOUTH ONCE DAILY 30 tablet 6  . amitriptyline (ELAVIL) 25 MG tablet Take 25 mg by mouth at bedtime.      Marland Kitchen atorvastatin (LIPITOR) 40 MG tablet TAKE ONE TABLET BY MOUTH ONCE DAILY 90 tablet 3  . dicyclomine (BENTYL) 10 MG capsule Take 10 mg by mouth daily.     Marland Kitchen diltiazem (CARTIA XT) 180 MG 24 hr capsule Take 1 capsule (180 mg total) daily by mouth. 30 capsule 3  . ELIQUIS 5  MG TABS tablet TAKE 1 TABLET BY MOUTH TWICE DAILY 60 tablet 6  . esomeprazole (NEXIUM) 40 MG capsule TAKE 1 CAPSULE BY MOUTH TWICE DAILY BEFORE MEAL(S) 60 capsule 1  . gabapentin (NEURONTIN) 300 MG capsule Take 300 mg by mouth 2 (two) times daily.    . hydrALAZINE (APRESOLINE) 25 MG tablet Take 1 tablet (25 mg total) 3 (three) times daily by mouth. 90 tablet 3  . insulin glargine (LANTUS) 100 UNIT/ML injection Inject 60 Units into the skin every morning.     . levalbuterol (XOPENEX) 0.31 MG/3ML nebulizer solution Take 1 ampule by nebulization every 4 (four) hours as needed for wheezing.    Marland Kitchen levothyroxine (SYNTHROID, LEVOTHROID) 25 MCG tablet Take 1 tablet by mouth daily.    Marland Kitchen LINZESS 290 MCG CAPS capsule     . metolazone (ZAROXOLYN) 2.5 MG tablet Take 1 tablet (2.5 mg total) by mouth 2 (two) times a week. Tuesday and Friday 8 tablet 6  . metoprolol tartrate (LOPRESSOR) 25 MG tablet TAKE 3 TABLETS BY MOUTH TWICE DAILY 180 tablet 6  . nitroGLYCERIN (NITROSTAT) 0.4 MG SL tablet Place 0.4 mg under the tongue every 5 (five) minutes as needed for chest pain (x 3 doses). Reported on 02/25/2016    . NOVOLOG FLEXPEN 100 UNIT/ML FlexPen     . OXYGEN Inhale 3 L/min into the lungs continuous. Patient uses nightly    . potassium chloride SA (K-DUR,KLOR-CON) 20 MEQ tablet Take 20 mEq by mouth 4 (four) times daily.     . ranitidine (ZANTAC) 150 MG tablet Take 150 mg by mouth as needed for heartburn.    Marland Kitchen RELION PEN NEEDLES 32G X 4 MM MISC     . torsemide (DEMADEX) 100 MG tablet Take 100 mg by mouth 2 (two) times daily.    . traMADol (ULTRAM) 50 MG tablet     . ULORIC 80 MG TABS Take 1 tablet by mouth daily.     . Vitamin D, Ergocalciferol, (DRISDOL) 50000 UNITS CAPS capsule Take 50,000 Units by mouth every Monday.    Penne Lash HFA 45 MCG/ACT inhaler Inhale 2 puffs into the lungs every 4 (four) hours as needed for wheezing or shortness of breath. 1 Inhaler 1  . zolpidem (AMBIEN) 10 MG tablet Take 10 mg by mouth  at bedtime.      No current facility-administered medications for this visit.     Physical Exam: Vitals:   01/16/18 1611  BP: 124/72  Pulse: 70  Weight: 257 lb (116.6 kg)  Height: 5\' 4"  (1.626 m)    GEN- The patient is overweight and chronically ill appearing, alert and oriented x 3 today.   Head- normocephalic, atraumatic Eyes-  Sclera clear, conjunctiva pink Ears- hearing intact Oropharynx- clear Lungs- Clear to ausculation bilaterally,  normal work of breathing Chest- pacemaker pocket is well healed Heart- Regular rate and rhythm, no murmurs, rubs or gallops, PMI not laterally displaced GI- soft, NT, ND, + BS Extremities- no clubbing, cyanosis, or edema MS- in a wheelchair today  Pacemaker interrogation- reviewed in detail today,  See PACEART report  ekg tracing ordered today is personally reviewed and shows atrial paced rhythm, 70 bpm, PR 260 msce, QRS 94 msec, Qtc 442 msec,  Nonspecific ST/T changes  Assessment and Plan:  1. Symptomatic sinus bradycardia  Normal pacemaker function See Pace Art report No changes today  2. Persistent afib Well controlled with amiodarone (No AF since cardioversion 7/18) Amiodarone as directed by Dr Aundra Dubin, who is also following LFTs, TFTs On eliquis as per Dr Aundra Dubin who is following renal function  3. HTN Stable No change required today  4. Stage IV renal function Stable No change required today  5. ILR Low threshold to stop amiodarone.  Will defer to Dr Halford Chessman and Aundra Dubin  6. CAD No ischemic symptoms  7. OSA Compliant with CPAP  [facility] Return to see EP PA in a year  [name] MD, [facility] 01/16/2018 4:40 PM

## 2018-01-18 DIAGNOSIS — R0602 Shortness of breath: Secondary | ICD-10-CM | POA: Diagnosis not present

## 2018-01-21 NOTE — Telephone Encounter (Signed)
See previous telephone note. 

## 2018-01-25 DIAGNOSIS — I509 Heart failure, unspecified: Secondary | ICD-10-CM | POA: Diagnosis not present

## 2018-02-12 DIAGNOSIS — I5030 Unspecified diastolic (congestive) heart failure: Secondary | ICD-10-CM | POA: Diagnosis not present

## 2018-02-12 DIAGNOSIS — N184 Chronic kidney disease, stage 4 (severe): Secondary | ICD-10-CM | POA: Diagnosis not present

## 2018-02-12 DIAGNOSIS — N2581 Secondary hyperparathyroidism of renal origin: Secondary | ICD-10-CM | POA: Diagnosis not present

## 2018-02-12 DIAGNOSIS — E8770 Fluid overload, unspecified: Secondary | ICD-10-CM | POA: Diagnosis not present

## 2018-02-18 DIAGNOSIS — R0602 Shortness of breath: Secondary | ICD-10-CM | POA: Diagnosis not present

## 2018-02-20 ENCOUNTER — Other Ambulatory Visit (HOSPITAL_COMMUNITY): Payer: Self-pay

## 2018-02-20 DIAGNOSIS — I5022 Chronic systolic (congestive) heart failure: Secondary | ICD-10-CM

## 2018-02-20 MED ORDER — DILTIAZEM HCL ER COATED BEADS 180 MG PO CP24: 180.0000 mg | ORAL_CAPSULE | Freq: Every day | ORAL | 1 refills | Status: DC

## 2018-02-22 DIAGNOSIS — Z0000 Encounter for general adult medical examination without abnormal findings: Secondary | ICD-10-CM | POA: Diagnosis not present

## 2018-02-22 DIAGNOSIS — E785 Hyperlipidemia, unspecified: Secondary | ICD-10-CM | POA: Diagnosis not present

## 2018-02-22 DIAGNOSIS — E1122 Type 2 diabetes mellitus with diabetic chronic kidney disease: Secondary | ICD-10-CM | POA: Diagnosis not present

## 2018-02-22 DIAGNOSIS — N390 Urinary tract infection, site not specified: Secondary | ICD-10-CM | POA: Diagnosis not present

## 2018-02-22 DIAGNOSIS — M109 Gout, unspecified: Secondary | ICD-10-CM | POA: Diagnosis not present

## 2018-02-25 DIAGNOSIS — I509 Heart failure, unspecified: Secondary | ICD-10-CM | POA: Diagnosis not present

## 2018-03-12 ENCOUNTER — Telehealth: Payer: Self-pay | Admitting: Cardiology

## 2018-03-12 ENCOUNTER — Encounter: Payer: PPO | Admitting: *Deleted

## 2018-03-12 NOTE — Telephone Encounter (Signed)
Spoke with pt and reminded pt of remote transmission that is due today. Pt verbalized understanding.   

## 2018-03-13 ENCOUNTER — Encounter: Payer: Self-pay | Admitting: Cardiology

## 2018-03-15 ENCOUNTER — Ambulatory Visit (INDEPENDENT_AMBULATORY_CARE_PROVIDER_SITE_OTHER): Payer: PPO | Admitting: *Deleted

## 2018-03-15 DIAGNOSIS — I495 Sick sinus syndrome: Secondary | ICD-10-CM | POA: Diagnosis not present

## 2018-03-16 ENCOUNTER — Other Ambulatory Visit (HOSPITAL_COMMUNITY): Payer: Self-pay | Admitting: Cardiology

## 2018-03-18 NOTE — Progress Notes (Signed)
Remote pacemaker transmission.   

## 2018-03-20 ENCOUNTER — Other Ambulatory Visit (HOSPITAL_COMMUNITY): Payer: Self-pay | Admitting: Internal Medicine

## 2018-03-20 ENCOUNTER — Encounter: Payer: Self-pay | Admitting: Cardiology

## 2018-03-20 DIAGNOSIS — R0602 Shortness of breath: Secondary | ICD-10-CM | POA: Diagnosis not present

## 2018-03-22 DIAGNOSIS — E1165 Type 2 diabetes mellitus with hyperglycemia: Secondary | ICD-10-CM | POA: Diagnosis not present

## 2018-03-22 DIAGNOSIS — I739 Peripheral vascular disease, unspecified: Secondary | ICD-10-CM | POA: Diagnosis not present

## 2018-03-22 DIAGNOSIS — E1122 Type 2 diabetes mellitus with diabetic chronic kidney disease: Secondary | ICD-10-CM | POA: Diagnosis not present

## 2018-03-22 DIAGNOSIS — I4891 Unspecified atrial fibrillation: Secondary | ICD-10-CM | POA: Diagnosis not present

## 2018-03-22 DIAGNOSIS — E1142 Type 2 diabetes mellitus with diabetic polyneuropathy: Secondary | ICD-10-CM | POA: Diagnosis not present

## 2018-03-22 DIAGNOSIS — I2510 Atherosclerotic heart disease of native coronary artery without angina pectoris: Secondary | ICD-10-CM | POA: Diagnosis not present

## 2018-03-22 DIAGNOSIS — J449 Chronic obstructive pulmonary disease, unspecified: Secondary | ICD-10-CM | POA: Diagnosis not present

## 2018-03-22 DIAGNOSIS — I5032 Chronic diastolic (congestive) heart failure: Secondary | ICD-10-CM | POA: Diagnosis not present

## 2018-03-22 DIAGNOSIS — N184 Chronic kidney disease, stage 4 (severe): Secondary | ICD-10-CM | POA: Diagnosis not present

## 2018-03-22 DIAGNOSIS — Z0000 Encounter for general adult medical examination without abnormal findings: Secondary | ICD-10-CM | POA: Diagnosis not present

## 2018-04-04 ENCOUNTER — Telehealth (HOSPITAL_COMMUNITY): Payer: Self-pay | Admitting: *Deleted

## 2018-04-04 NOTE — Telephone Encounter (Signed)
Received dental procedure clearance request from Dr. Lupita Leash office.  Patient should hold Eliquis 2 days prior to procedure per Dr. Aundra Dubin.  Clearance faxed today to [phone].

## 2018-04-05 DIAGNOSIS — R0602 Shortness of breath: Secondary | ICD-10-CM | POA: Diagnosis not present

## 2018-04-13 DIAGNOSIS — M1712 Unilateral primary osteoarthritis, left knee: Secondary | ICD-10-CM | POA: Diagnosis not present

## 2018-04-16 DIAGNOSIS — G4733 Obstructive sleep apnea (adult) (pediatric): Secondary | ICD-10-CM | POA: Diagnosis not present

## 2018-04-16 DIAGNOSIS — J449 Chronic obstructive pulmonary disease, unspecified: Secondary | ICD-10-CM | POA: Diagnosis not present

## 2018-04-16 DIAGNOSIS — R0602 Shortness of breath: Secondary | ICD-10-CM | POA: Diagnosis not present

## 2018-04-16 LAB — CUP PACEART REMOTE DEVICE CHECK
Date Time Interrogation Session: 20190813122941
Implantable Lead Implant Date: 20151015
Implantable Lead Implant Date: 20151015
Implantable Lead Location: 753859
Implantable Lead Location: 753860
Implantable Lead Model: 1948
Implantable Pulse Generator Implant Date: 20151015
Pulse Gen Model: 2240
Pulse Gen Serial Number: 7665001

## 2018-04-20 DIAGNOSIS — R0602 Shortness of breath: Secondary | ICD-10-CM | POA: Diagnosis not present

## 2018-04-23 ENCOUNTER — Other Ambulatory Visit (HOSPITAL_COMMUNITY): Payer: Self-pay

## 2018-04-23 MED ORDER — AMIODARONE HCL 200 MG PO TABS: 200.0000 mg | ORAL_TABLET | Freq: Every day | ORAL | 0 refills | Status: DC

## 2018-05-21 DIAGNOSIS — R0602 Shortness of breath: Secondary | ICD-10-CM | POA: Diagnosis not present

## 2018-05-24 DIAGNOSIS — I5030 Unspecified diastolic (congestive) heart failure: Secondary | ICD-10-CM | POA: Diagnosis not present

## 2018-05-24 DIAGNOSIS — E8770 Fluid overload, unspecified: Secondary | ICD-10-CM | POA: Diagnosis not present

## 2018-05-24 DIAGNOSIS — N184 Chronic kidney disease, stage 4 (severe): Secondary | ICD-10-CM | POA: Diagnosis not present

## 2018-05-24 DIAGNOSIS — N185 Chronic kidney disease, stage 5: Secondary | ICD-10-CM | POA: Diagnosis not present

## 2018-05-24 DIAGNOSIS — N2581 Secondary hyperparathyroidism of renal origin: Secondary | ICD-10-CM | POA: Diagnosis not present

## 2018-06-15 DIAGNOSIS — M25562 Pain in left knee: Secondary | ICD-10-CM | POA: Diagnosis not present

## 2018-06-17 ENCOUNTER — Telehealth: Payer: Self-pay | Admitting: Cardiology

## 2018-06-17 ENCOUNTER — Ambulatory Visit (INDEPENDENT_AMBULATORY_CARE_PROVIDER_SITE_OTHER): Payer: PPO | Admitting: *Deleted

## 2018-06-17 DIAGNOSIS — I495 Sick sinus syndrome: Secondary | ICD-10-CM

## 2018-06-17 NOTE — Telephone Encounter (Signed)
Spoke with pt and reminded pt of remote transmission that is due today. Pt verbalized understanding.   

## 2018-06-17 NOTE — Progress Notes (Signed)
Remote pacemaker transmission.   

## 2018-06-20 ENCOUNTER — Encounter: Payer: Self-pay | Admitting: Cardiology

## 2018-06-20 DIAGNOSIS — R0602 Shortness of breath: Secondary | ICD-10-CM | POA: Diagnosis not present

## 2018-07-07 ENCOUNTER — Other Ambulatory Visit (HOSPITAL_COMMUNITY): Payer: Self-pay | Admitting: Cardiology

## 2018-07-17 LAB — CUP PACEART REMOTE DEVICE CHECK
Battery Remaining Longevity: 102 mo
Battery Remaining Percentage: 95.5 %
Battery Voltage: 2.98 V
Brady Statistic AP VP Percent: 20 %
Brady Statistic AP VS Percent: 79 %
Brady Statistic AS VP Percent: 1 %
Brady Statistic AS VS Percent: 1 %
Brady Statistic RA Percent Paced: 98 %
Brady Statistic RV Percent Paced: 20 %
Date Time Interrogation Session: 20191014152933
Implantable Lead Implant Date: 20151015
Implantable Lead Implant Date: 20151015
Implantable Lead Location: 753859
Implantable Lead Location: 753860
Implantable Lead Model: 1948
Implantable Pulse Generator Implant Date: 20151015
Lead Channel Impedance Value: 410 Ohm
Lead Channel Impedance Value: 540 Ohm
Lead Channel Pacing Threshold Amplitude: 0.75 V
Lead Channel Pacing Threshold Amplitude: 1.25 V
Lead Channel Pacing Threshold Pulse Width: 0.5 ms
Lead Channel Pacing Threshold Pulse Width: 0.5 ms
Lead Channel Sensing Intrinsic Amplitude: 12 mV
Lead Channel Sensing Intrinsic Amplitude: 5 mV
Lead Channel Setting Pacing Amplitude: 2 V
Lead Channel Setting Pacing Amplitude: 2.5 V
Lead Channel Setting Pacing Pulse Width: 0.5 ms
Lead Channel Setting Sensing Sensitivity: 2 mV
Pulse Gen Model: 2240
Pulse Gen Serial Number: 7665001

## 2018-07-19 DIAGNOSIS — E1165 Type 2 diabetes mellitus with hyperglycemia: Secondary | ICD-10-CM | POA: Diagnosis not present

## 2018-07-19 DIAGNOSIS — I2510 Atherosclerotic heart disease of native coronary artery without angina pectoris: Secondary | ICD-10-CM | POA: Diagnosis not present

## 2018-07-19 DIAGNOSIS — N184 Chronic kidney disease, stage 4 (severe): Secondary | ICD-10-CM | POA: Diagnosis not present

## 2018-07-21 DIAGNOSIS — R0602 Shortness of breath: Secondary | ICD-10-CM | POA: Diagnosis not present

## 2018-07-26 DIAGNOSIS — G4733 Obstructive sleep apnea (adult) (pediatric): Secondary | ICD-10-CM | POA: Diagnosis not present

## 2018-07-26 DIAGNOSIS — E1122 Type 2 diabetes mellitus with diabetic chronic kidney disease: Secondary | ICD-10-CM | POA: Diagnosis not present

## 2018-07-26 DIAGNOSIS — E1165 Type 2 diabetes mellitus with hyperglycemia: Secondary | ICD-10-CM | POA: Diagnosis not present

## 2018-07-26 DIAGNOSIS — I2510 Atherosclerotic heart disease of native coronary artery without angina pectoris: Secondary | ICD-10-CM | POA: Diagnosis not present

## 2018-07-26 DIAGNOSIS — Z23 Encounter for immunization: Secondary | ICD-10-CM | POA: Diagnosis not present

## 2018-07-26 DIAGNOSIS — E1142 Type 2 diabetes mellitus with diabetic polyneuropathy: Secondary | ICD-10-CM | POA: Diagnosis not present

## 2018-07-26 DIAGNOSIS — N184 Chronic kidney disease, stage 4 (severe): Secondary | ICD-10-CM | POA: Diagnosis not present

## 2018-07-26 DIAGNOSIS — I10 Essential (primary) hypertension: Secondary | ICD-10-CM | POA: Diagnosis not present

## 2018-08-02 ENCOUNTER — Other Ambulatory Visit (HOSPITAL_COMMUNITY): Payer: Self-pay | Admitting: Cardiology

## 2018-08-16 ENCOUNTER — Other Ambulatory Visit (HOSPITAL_COMMUNITY): Payer: Self-pay | Admitting: Cardiology

## 2018-08-16 DIAGNOSIS — I5030 Unspecified diastolic (congestive) heart failure: Secondary | ICD-10-CM | POA: Diagnosis not present

## 2018-08-16 DIAGNOSIS — N2581 Secondary hyperparathyroidism of renal origin: Secondary | ICD-10-CM | POA: Diagnosis not present

## 2018-08-16 DIAGNOSIS — E8770 Fluid overload, unspecified: Secondary | ICD-10-CM | POA: Diagnosis not present

## 2018-08-16 DIAGNOSIS — N185 Chronic kidney disease, stage 5: Secondary | ICD-10-CM | POA: Diagnosis not present

## 2018-08-16 DIAGNOSIS — I5022 Chronic systolic (congestive) heart failure: Secondary | ICD-10-CM

## 2018-08-17 ENCOUNTER — Other Ambulatory Visit (HOSPITAL_COMMUNITY): Payer: Self-pay | Admitting: Cardiology

## 2018-08-20 ENCOUNTER — Other Ambulatory Visit (HOSPITAL_COMMUNITY): Payer: Self-pay

## 2018-08-20 DIAGNOSIS — R0602 Shortness of breath: Secondary | ICD-10-CM | POA: Diagnosis not present

## 2018-08-20 MED ORDER — AMIODARONE HCL 200 MG PO TABS: 200.0000 mg | ORAL_TABLET | Freq: Every day | ORAL | 0 refills | Status: DC

## 2018-09-04 DIAGNOSIS — R0602 Shortness of breath: Secondary | ICD-10-CM | POA: Diagnosis not present

## 2018-09-09 ENCOUNTER — Other Ambulatory Visit: Payer: Self-pay

## 2018-09-09 NOTE — Patient Outreach (Signed)
[facility]) Care Management Chronic Special Needs Program  09/10/2018  Name: [name] DOB: September 06, 1945  MRN: [id number]  [name] is enrolled in a chronic special needs plan for Diabetes. Chronic Care Management Coordinator telephoned client to review health risk assessment and to develop individualized care plan.  Introduced the chronic care management program, importance of client participation, and taking their care plan to all provider appointments and inpatient facilities.    Subjective: client reports she lives alone and has personal care service with "[facility]". She states her daughter/children are her support system.   Goals Addressed            This Visit's Progress   . COMPLETED: Client understands the importance of follow-up with providers by attending scheduled visits      . Client will have food insecurities addressed within 3 months   On track    Referral Made to [facility] worker. A social worker will call you.    Marland Kitchen Client will increase activity tolerance within the next year.   On track   . Client will report no fall or injuries in the next 3 months.      . Client will use Assistive Devices as needed and verbalize understanding of device use   On track   . Client will verbalize knowledge of diabetes self-management as evidenced by Hgb A1C <7 or as defined by provider.   On track    Diabetes self management actions:  Glucose monitoring per provider recommendations  Perform Quality checks on blood meter  Eat Healthy  Check feet daily  Visit provider every 3-6 months as directed  Hbg A1C level every 3-6 months.  Eye Exam yearly    . Client will verbalize knowledge of Heart Failure disease self management skills with 9 months.      . Client/Caregiver will verbalize understanding of instructions related to self-care and safety   On track    Continue to use your walker as recommended. Continue to be careful  when walking around the oxygen tubing. Keep your walkways clear of anything you can trip on and limit throw rugs that can be a tripping hazard.    Marland Kitchen HEMOGLOBIN A1C < 7.0      . Maintain timely refills of diabetic medication as prescribed within the year .   On track   . Obtain annual  Lipid Profile, LDL-C   On track   . Obtain Annual Eye (retinal)  Exam    On track   . Obtain Annual Foot Exam   On track   . Obtain annual screen for micro albuminuria (urine) , nephropathy (kidney problems)   On track   . Obtain Hemoglobin A1C at least 2 times per year   On track   . Visit Primary Care Provider or Endocrinologist at least 2 times per year    On track     Assessment: 73 year old, per client with history of diabetes, heart failure, heart disease, interstitial lung disease, HTN, heart attack, atrial fibrillation, kidney failure, per client she has told per providers that she will not go on dialysis. States she is on oxygen at 4 liters/nasal canula.   She reports trouble obtaining food, client also reports she lives on the second floor which hinders her from being able to get out or get around. She is interested in finding other housing if possible.  Reports she has all her medications, daughter fills her pill box.  some memory loss and on occasion has forgotten to take her noon medications and/or takes her evening medications earlier than she is supposed to.   Plan: Send successful outreach letter with a copy of their individualized care plan, Send individual care plan to provider and Send educational material  Chronic care management coordination will outreach next month: Will refer client to: Social work and Pharmacy.    Thea Silversmith, RN, MSN, [place] Cell: [phone]

## 2018-09-10 ENCOUNTER — Telehealth: Payer: Self-pay | Admitting: Pharmacist

## 2018-09-10 NOTE — Telephone Encounter (Signed)
----- Message from [place] sent at 09/10/2018 10:06 AM EST ----- Regarding: Referral: Order for [name]  Referral from Thea Silversmith, RN  "Please see below request"  Forde Radon ----- Message ----- From: Luretha Rued, RN Sent: 09/09/2018   5:33 PM EST To: Thn Cm Communication Orders Subject: Order for [name], [name]                        Patient Name: [name], [name] Q([id number]) Sex: Female DOB: [birth year]    PCP: Merrilee Seashore   Center: [facility]   Types of orders made on 09/09/2018: Nursing  Order Date:09/09/2018 Ordering Urban Gibson [[id number]] Encounter Provider:Wallace, Deno Etienne, RN [phone] Authorizi ng Provider: Merrilee Seashore, MD [place] Department:[facility]-LINK TO [place]  Order Specific Information Order: Comm to Pharmacy [Custom: [id number]]  Order #: [id number] Qty: 1   Priority: Routine  Class: Clinic Performed   Comment:Please refer to pharmacy. She expresses some memory loss and gives            example that when she goes from one room to the other she may forget            wh y she went into that room. Her daughter prepares her medications            and she reports she usually takes her medication, but on occasion has            missed her noon dose or taken her evening dose too early. Her            daughter manages her pill box. She is on insulin novolog and lantus.            When stating how much she takes she states a dose, but then reports            sometimes it depends. She states she does not always che ck her blood            sugar levels. She also reports she will soon run out of the cartridge            type needle that fit onto her lancet.I am also referring to [place] work. Thank you.                        Thea Silversmith, RN, MSN, [place]            Cell: [phone]  Reason for Consult -> Polypharmacy          -> Other     Z1    Priority: Routine  Class: Clinic Performed   Comment:Please refer to pharmacy. She expresses some memory loss and gives            example that when she goes from one room to the other she may forget            why she went into that room. Her daughter prepares her medications            and she reports she usually takes her medication, but on occasion has            missed her noon dose or taken her  evening dose too early.  Her            daughter manages her pill box. She is on insulin novolog and lantus.            When stating how much she takes she states a dose, but then reports            sometimes it depends. She states she does not always check her blood            sugar levels. She also reports she will soon run out of the cartridge            type needle that fit onto her lancet.I am also referring to [place] work. Thank you.                         Thea Silversmith, RN, MSN, [facility]            Cell: [phone]     Reason for Consult -> Polypharmacy          -> Other

## 2018-09-10 NOTE — Patient Outreach (Signed)
[facility]) Care Management  09/10/2018  [name] 27-Aug-1946 [id number]   Patient was called regarding medication management and assistance. Unfortunately, she did not answer the phone. HIPAA compliant message was left on her voicemail.    Referral from [facility] said the following:  "She expresses some memory loss and gives  example that when she goes from one room to the other she may forget why she went into that room. Her daughter prepares her medications  and she reports she usually takes her medication, but on occasion has missed her noon dose or taken her evening dose too early. Her daughter manages her pill box. She is on insulin novolog and lantus.             When stating how much she takes she states a dose, but then reports sometimes it depends. She states she does not always check her blood sugar levels. She also reports she will soon run out of the cartridge   type needle that fit onto her lancet."  Plan: Send patient an unsuccessful contact letter.  Call patient back in 2-3 business days.   Elayne Guerin, PharmD, [place] Clinical Pharmacist [phone]

## 2018-09-12 ENCOUNTER — Ambulatory Visit: Payer: Self-pay | Admitting: Pharmacist

## 2018-09-12 ENCOUNTER — Other Ambulatory Visit: Payer: Self-pay | Admitting: Pharmacist

## 2018-09-12 NOTE — Progress Notes (Signed)
Patient ID: [name], female   DOB: 09/25/1945, 73 y.o.   MRN: [id number]   PCP: Dr. Alyson Ingles  Nephrologist: Dr. Florene Glen Cardiology: Dr Aundra Dubin  HPI: [name] is a 73 y.o. female hypertension, hyperlipidemia, bronchial asthma, diabetes mellitus, CKD stage IV (baseline cr ~2.5), CAD, atrial fibrillation and diastolic HF.  Cath 5/12 with Promus DES to proximal LCx.  RCA was totally occluded with collaterals.   She was admitted in 4/15 for R TKR. That hospitalization complicated by a/c diastolic HF with respiratory distress requiring non-rebreather support. Weight on discharge was 226 pounds. ABG at that time 7.4/34/91/97%. Cr peaked at 2.9  Admitted 6/30-7/10/15 for SOB and CP following scheduled cardioversion. Found to have severe aspiration PNA and was intubated. She maintained SR for short period of time and then went back into Afib/Aflutter.  HR controlled on amio and diltiazem. Discharged to [facility] X[facility] at a weight of 232 lbs.   Admitted 06/16/14 with symptomatic tachy/brady syndrome. 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 Discharge weight was 223 pounds.   Admitted 02/16/15 for symptomatic anemia s/p blood work from her pulmonologist.  Was diuresed with lasix while in hospital.   PFTs (6/16) with FVC 80%, FEV1 83%, ratio 103%, TLC 75%, DLCO 78% => restriction c/w interstitial process.  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   Holter (7/16) with rare PACs, PVCs; no atrial fibrillation.  After her CT showing interstitial fibrosis, she saw pulmonary. There was concern for connective tissue disease with lung involvement.  She has seen rheumatology and was told that she likely has rheumatoid arthritis.  The pulmonologist did not think that her ILD was due to amiodarone, so she is still on amiodarone.    Peripheral arterial dopplers in 6/18 showed 50-74% left mid SFA stenosis and 30-49% right  mid SFA stenosis.    Echo 03/21/17 EF 55-60% with moderate LVH, normal RV size and systolic function, mild MR.    S/p DCCV 03/30/17 with return to NSR.   She returns today for HF follow up. Last seen March 2019. She has been doing poorly over the last month. She is more SOB with ADLs. She has been having exertional CP. She is extremely fatigued with activity. She is not very active and arrived in wheelchair today. She has BLE edema. +orthopnea. Wearing CPAP qHS. Poor appetite. +nausea. She has a HH aide coming through [place]. She has less UOP with torsemide and metolazone now. She could not stand to weigh today, but reported weight from home is 10 lbs down from March. She has a new nephrologist, Dr Johnney Ou. She was supposed to see Dr Halford Chessman today at 10 am, but arrived late to her appointment here and had to reschedule.   ECG (personally reviewed): a-paced, nonspecific T wave flattening.    Labs: 11/16: K 3.5, creatinine 3.4, HCT 45.1 2/17: K 3, creatinine 3.22, LFTs normal, TSH normal, HCT 46.9 7/17: K 3.6, creatinine 3.6, HCT 43.3, LFTs normal, TSH normal 5/18: K 4.5, creatinine 4.07, TSH normal, LFTs normal, hgb 14.6 7/18: K 4.2, creatinine 4.27, LDL 52, TSH normal 10/18: K 4.1, creatinine 3.64 11/18: TSH normal, hgb 14.3, K 4, creatinine 4.38, LFTs normal 2/19: BNP 139, K 3.8, creatinine 3.78, LFTs normal, TSH normal  Review of systems complete and found to be negative unless listed in HPI.   SH:  Social History   Socioeconomic History  . Marital status: Widowed  Spouse name: Not on file  . Number of children: 3  . Years of education: Not on file  . Highest education level: Not on file  Occupational History  . Occupation: Disabled  Social Needs  . Financial resource strain: Not on file  . Food insecurity:    Worry: Sometimes true    Inability: Sometimes true  . Transportation needs:    Medical: Not on file    Non-medical: Not on file  Tobacco Use  . Smoking status:  Former Smoker    Packs/day: 0.50    Years: 35.00    Pack years: 17.50    Types: Cigarettes    Last attempt to quit: 02/16/1995    Years since quitting: 23.5  . Smokeless tobacco: Never Used  . Tobacco comment: 05/2014  QUIT OVER 20 YEARS AGO "  Substance and Sexual Activity  . Alcohol use: No    Alcohol/week: 0.0 standard drinks  . Drug use: No  . Sexual activity: Not Currently  Lifestyle  . Physical activity:    Days per week: Not on file    Minutes per session: Not on file  . Stress: Not on file  Relationships  . Social connections:    Talks on phone: Not on file    Gets together: Not on file    Attends religious service: Not on file    Active member of club or organization: Not on file    Attends meetings of clubs or organizations: Not on file    Relationship status: Not on file  . Intimate partner violence:    Fear of current or ex partner: Not on file    Emotionally abused: Not on file    Physically abused: Not on file    Forced sexual activity: Not on file  Other Topics Concern  . Not on file  Social History Narrative  . Not on file    FH:  Family History  Problem Relation Age of Onset  . Heart disease Mother   . Bladder Cancer Mother   . Kidney disease Mother   . Ovarian cancer Daughter   . Stomach cancer Maternal Uncle   . Colon cancer Maternal Aunt        dx in her 65's  . Esophageal cancer Neg Hx     Past Medical History:  Diagnosis Date  . Anginal pain ([place])    occ; non-ischemic Lexiscan 09/2012  . Arthritis   . Asthma   . Atrial flutter ([place])    ablated by Dr Lovena Le in 2008  . CKD (chronic kidney disease) 04/2007   CKD stage 4(Dr. Erling Cruz)  . Complication of anesthesia     DIFFICULTY BREATHING   . Coronary atherosclerosis of native coronary artery   . Diastolic heart failure 01/6386   grade 2 diastolic dysfunction per 01/6432 echo  . DNI (do not intubate)   . DNR (do not resuscitate)   . Esophageal dysmotility 2008   noted on esophagram.   hx dysphagia.   . Fatty liver 2008   noted on ultrasound 2008  . Fatty tumor fatty tumor back  . GERD (gastroesophageal reflux disease) 2012   Barrets esophagus on bx 2012 and 2014.   Marland Kitchen Gouty arthropathy   . Heart murmur   . Hyperlipidemia   . Hypertension   . 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   . Morbid obesity ([place])   . Myocardial infarction ([place]) 2009  . Peripheral vascular disease ([place])   .  Persistent atrial fibrillation    chads2 vasc score of at least 5  . Sick sinus syndrome ([place])   . Sleep apnea    wears CPAP   Current Outpatient Medications  Medication Sig Dispense Refill  . albuterol (PROVENTIL) (2.5 MG/3ML) 0.083% nebulizer solution Take 3 mLs (2.5 mg total) by nebulization every 6 (six) hours as needed for shortness of breath. 75 mL 3  . amiodarone (PACERONE) 200 MG tablet Take 1 tablet (200 mg total) by mouth daily. 90 tablet 0  . amitriptyline (ELAVIL) 25 MG tablet Take 25 mg by mouth at bedtime.      Marland Kitchen atorvastatin (LIPITOR) 40 MG tablet TAKE 1 TABLET BY MOUTH ONCE DAILY 90 tablet 1  . CARTIA XT 180 MG 24 hr capsule TAKE 1 CAPSULE BY MOUTH ONCE DAILY 90 capsule 1  . dicyclomine (BENTYL) 10 MG capsule Take 10 mg by mouth daily.     Marland Kitchen ELIQUIS 5 MG TABS tablet TAKE 1 TABLET BY MOUTH TWICE DAILY 180 tablet 1  . esomeprazole (NEXIUM) 40 MG capsule TAKE 1 CAPSULE BY MOUTH TWICE DAILY BEFORE MEAL(S) 60 capsule 1  . hydrALAZINE (APRESOLINE) 25 MG tablet TAKE 1 TABLET BY MOUTH THREE TIMES DAILY 270 tablet 1  . insulin glargine (LANTUS) 100 UNIT/ML injection Inject 60 Units into the skin every morning.     . levalbuterol (XOPENEX) 0.31 MG/3ML nebulizer solution Take 1 ampule by nebulization every 4 (four) hours as needed for wheezing.    Marland Kitchen levothyroxine (SYNTHROID, LEVOTHROID) 25 MCG tablet Take 1 tablet by mouth daily.    Marland Kitchen LINZESS 290 MCG CAPS capsule     . metolazone (ZAROXOLYN) 2.5 MG tablet Take 1 tablet (2.5 mg total) by mouth 2 (two) times a  week. Tuesday and Friday (Patient taking differently: Take 2.5 mg by mouth 2 (two) times a week. Tuesday and saturday) 8 tablet 6  . metoprolol tartrate (LOPRESSOR) 25 MG tablet TAKE 3 TABLETS BY MOUTH TWICE DAILY 180 tablet 6  . nitroGLYCERIN (NITROSTAT) 0.4 MG SL tablet Place 0.4 mg under the tongue every 5 (five) minutes as needed for chest pain (x 3 doses). Reported on 02/25/2016    . NOVOLOG FLEXPEN 100 UNIT/ML FlexPen     . OXYGEN Inhale 3 L/min into the lungs continuous. Patient uses nightly    . potassium chloride SA (K-DUR,KLOR-CON) 20 MEQ tablet Take 20 mEq by mouth 4 (four) times daily. 2 tabs four times daily    . RELION PEN NEEDLES 32G X 4 MM MISC     . torsemide (DEMADEX) 100 MG tablet Take 100 mg by mouth 2 (two) times daily.    Marland Kitchen ULORIC 80 MG TABS Take 1 tablet by mouth daily.     . Vitamin D, Ergocalciferol, (DRISDOL) 50000 UNITS CAPS capsule Take 50,000 Units by mouth every Monday.    Penne Lash HFA 45 MCG/ACT inhaler Inhale 2 puffs into the lungs every 4 (four) hours as needed for wheezing or shortness of breath. 1 Inhaler 1  . zolpidem (AMBIEN) 10 MG tablet Take 10 mg by mouth at bedtime.     . isosorbide mononitrate (IMDUR) 60 MG 24 hr tablet Take 1 tablet (60 mg total) by mouth daily. 90 tablet 3   No current facility-administered medications for this encounter.     Vitals:   09/13/18 0859  BP: (!) 144/88  Pulse: 70  SpO2: 96%  Weight: 112.9 kg (249 lb)   Wt Readings from Last 3 Encounters:  09/13/18 112.9 kg (  249 lb)  01/16/18 116.6 kg (257 lb)  11/30/17 117.5 kg (259 lb)    PHYSICAL EXAM: General: Appears fatigued. No resp difficulty. Arrived in wheelchair.  HEENT: Normal Neck: Supple. JVP difficult. Carotids 2+ bilat; no bruits. No thyromegaly or nodule noted. Cor: PMI nondisplaced. RRR, No M/G/R noted Lungs: CTAB, normal effort. On 3L O2.  Abdomen: Soft, non-tender, non-distended, no HSM. No bruits or masses. +BS  Extremities: No cyanosis, clubbing, or  rash. R and LLE trace ankle edema.  Neuro: Alert & orientedx3, cranial nerves grossly intact. moves all 4 extremities w/o difficulty. Affect pleasant  EKG: A-paced 70. Personally reviewed.   ASSESSMENT & PLAN:  1) Chronic diastolic HF: Echo 8/18 with EF 55-60% with moderate LVH and normal RV.  NYHA III, dyspnea likely has a pulmonary component with restrictive PFTs from body habitus and ILD (NSIP pattern on high resolution chest CT).  Volume difficult to assess on exam. Weight is down 10 lbs.  - Continue torsemide 100 mg BID with K replacement.  BMET today.  - Continue metolazone twice a week.  - Wear compression stockings.  2) Atrial fibrillation: s/p DCCV 03/2017. EKG Apaced.  - Continue amiodarone 200 mg daily.  Check LFTs and TSH.  She will need regular eye exams.  - Continue Eliquis for anticoagulation. Denies bleeding.  3) CKD stage 4: Follows with Dr Florene Glen. BMET today. Encouraged her to follow up with nephrology ASAP. Apparently they tried to place AVF at some point, but her arteries were too small.  4) Tachy/Brady syndrome: s/p St Jude PPM.    5) OSA: Encouraged nightly CPAP. No change.  6) Interstitial lung disease: Has ILD, may be RA-related.  Has seen Dr Amil Amen and follows with Dr Halford Chessman for pulmonology.   - Continue home oxygen. She had follow up with Dr Halford Chessman today, but it was at 10 am and she was late to her appointment here so had to reschedule.  7) Hyperlipidemia: Continue Lipitor. Check lipids today.  8) CAD: She is having exertional CP over the last month. Seems to be getting worse. Last cath was in 2012 with RCA occlusion with collaterals. Last creatinine was 4.0, so unable to repeat cath unless emergent.   - Continue statin, BB - No ASA with Eliquis use.  - Restart imdur 60 mg daily. She said she has had trouble tolerating in past due to nausea, but will try again.   Discussed situation with Dr Aundra Dubin. Unfortunately, there is not much we can do. She may be heading HD soon.  Encouraged her to follow up with nephrologist ASAP. Unable to do LHC with creatinine 4.0. We discussed symptoms that warrant ED visit.  CMET, TSH, CBC, lipid panel today. Start imdur. Repeat echo. Follow up in 2 months, sooner if EF has dropped.   Georgiana Shore, NP  09/13/2018    Greater than 50% of the 25 minute visit was spent in counseling/coordination of care regarding disease state education, salt/fluid restriction, sliding scale diuretics, and medication compliance.

## 2018-09-12 NOTE — Patient Outreach (Addendum)
[facility]) Care Management  09/12/2018  MATTIE NORDELL 28-Jul-1946 [id number]   Patient was called regarding medication management. Unfortunately, she did not answer the phone. HIPAA compliant message was left. Unsuccessful contact letter was sent 09/10/18.  Plan: Call patient back in 5-7 business days Alert Thea Silversmith, RN from [facility] program that I have been unable to reach the patient.   Elayne Guerin, PharmD, [place] Clinical Pharmacist [phone]

## 2018-09-13 ENCOUNTER — Encounter (HOSPITAL_COMMUNITY): Payer: Self-pay | Admitting: Internal Medicine

## 2018-09-13 ENCOUNTER — Ambulatory Visit: Payer: PPO | Admitting: Pulmonary Disease

## 2018-09-13 ENCOUNTER — Other Ambulatory Visit: Payer: Self-pay

## 2018-09-13 ENCOUNTER — Inpatient Hospital Stay (HOSPITAL_COMMUNITY)
Admission: EM | Admit: 2018-09-13 | Discharge: 2018-09-16 | DRG: 682 | Disposition: A | Discharge status: Hospice | Payer: HMO | Attending: Family Medicine | Admitting: Family Medicine

## 2018-09-13 ENCOUNTER — Encounter: Payer: Self-pay | Admitting: Pulmonary Disease

## 2018-09-13 ENCOUNTER — Ambulatory Visit (HOSPITAL_BASED_OUTPATIENT_CLINIC_OR_DEPARTMENT_OTHER)
Admission: RE | Admit: 2018-09-13 | Discharge: 2018-09-13 | Disposition: A | Discharge status: Home / Self Care | Payer: HMO | Source: Ambulatory Visit | Attending: Cardiology | Admitting: Cardiology

## 2018-09-13 ENCOUNTER — Inpatient Hospital Stay (HOSPITAL_COMMUNITY): Payer: HMO

## 2018-09-13 ENCOUNTER — Encounter (HOSPITAL_COMMUNITY): Payer: Self-pay | Admitting: Cardiology

## 2018-09-13 VITALS — BP 144/88 | HR 70 | Wt 249.0 lb

## 2018-09-13 VITALS — BP 120/84 | HR 70 | Ht 64.0 in | Wt 249.0 lb

## 2018-09-13 DIAGNOSIS — E1122 Type 2 diabetes mellitus with diabetic chronic kidney disease: Secondary | ICD-10-CM | POA: Diagnosis not present

## 2018-09-13 DIAGNOSIS — M1990 Unspecified osteoarthritis, unspecified site: Secondary | ICD-10-CM

## 2018-09-13 DIAGNOSIS — I5033 Acute on chronic diastolic (congestive) heart failure: Secondary | ICD-10-CM | POA: Diagnosis present

## 2018-09-13 DIAGNOSIS — E1151 Type 2 diabetes mellitus with diabetic peripheral angiopathy without gangrene: Secondary | ICD-10-CM

## 2018-09-13 DIAGNOSIS — M109 Gout, unspecified: Secondary | ICD-10-CM | POA: Diagnosis present

## 2018-09-13 DIAGNOSIS — I252 Old myocardial infarction: Secondary | ICD-10-CM

## 2018-09-13 DIAGNOSIS — I25118 Atherosclerotic heart disease of native coronary artery with other forms of angina pectoris: Secondary | ICD-10-CM

## 2018-09-13 DIAGNOSIS — Z91048 Other nonmedicinal substance allergy status: Secondary | ICD-10-CM

## 2018-09-13 DIAGNOSIS — I2510 Atherosclerotic heart disease of native coronary artery without angina pectoris: Secondary | ICD-10-CM

## 2018-09-13 DIAGNOSIS — E785 Hyperlipidemia, unspecified: Secondary | ICD-10-CM | POA: Insufficient documentation

## 2018-09-13 DIAGNOSIS — J45909 Unspecified asthma, uncomplicated: Secondary | ICD-10-CM | POA: Diagnosis not present

## 2018-09-13 DIAGNOSIS — I495 Sick sinus syndrome: Secondary | ICD-10-CM | POA: Diagnosis present

## 2018-09-13 DIAGNOSIS — I5032 Chronic diastolic (congestive) heart failure: Secondary | ICD-10-CM

## 2018-09-13 DIAGNOSIS — J849 Interstitial pulmonary disease, unspecified: Secondary | ICD-10-CM

## 2018-09-13 DIAGNOSIS — K224 Dyskinesia of esophagus: Secondary | ICD-10-CM | POA: Diagnosis not present

## 2018-09-13 DIAGNOSIS — N184 Chronic kidney disease, stage 4 (severe): Secondary | ICD-10-CM

## 2018-09-13 DIAGNOSIS — J9611 Chronic respiratory failure with hypoxia: Secondary | ICD-10-CM | POA: Diagnosis not present

## 2018-09-13 DIAGNOSIS — E1165 Type 2 diabetes mellitus with hyperglycemia: Secondary | ICD-10-CM | POA: Diagnosis not present

## 2018-09-13 DIAGNOSIS — Z950 Presence of cardiac pacemaker: Secondary | ICD-10-CM | POA: Insufficient documentation

## 2018-09-13 DIAGNOSIS — Z91040 Latex allergy status: Secondary | ICD-10-CM

## 2018-09-13 DIAGNOSIS — E872 Acidosis: Secondary | ICD-10-CM | POA: Diagnosis present

## 2018-09-13 DIAGNOSIS — Z888 Allergy status to other drugs, medicaments and biological substances status: Secondary | ICD-10-CM

## 2018-09-13 DIAGNOSIS — N179 Acute kidney failure, unspecified: Secondary | ICD-10-CM | POA: Diagnosis not present

## 2018-09-13 DIAGNOSIS — I480 Paroxysmal atrial fibrillation: Secondary | ICD-10-CM

## 2018-09-13 DIAGNOSIS — G4733 Obstructive sleep apnea (adult) (pediatric): Secondary | ICD-10-CM

## 2018-09-13 DIAGNOSIS — Z79890 Hormone replacement therapy (postmenopausal): Secondary | ICD-10-CM

## 2018-09-13 DIAGNOSIS — Z8249 Family history of ischemic heart disease and other diseases of the circulatory system: Secondary | ICD-10-CM

## 2018-09-13 DIAGNOSIS — Z515 Encounter for palliative care: Secondary | ICD-10-CM

## 2018-09-13 DIAGNOSIS — N185 Chronic kidney disease, stage 5: Secondary | ICD-10-CM | POA: Diagnosis present

## 2018-09-13 DIAGNOSIS — K219 Gastro-esophageal reflux disease without esophagitis: Secondary | ICD-10-CM | POA: Diagnosis present

## 2018-09-13 DIAGNOSIS — Z7901 Long term (current) use of anticoagulants: Secondary | ICD-10-CM

## 2018-09-13 DIAGNOSIS — Z79899 Other long term (current) drug therapy: Secondary | ICD-10-CM

## 2018-09-13 DIAGNOSIS — I4891 Unspecified atrial fibrillation: Secondary | ICD-10-CM

## 2018-09-13 DIAGNOSIS — I132 Hypertensive heart and chronic kidney disease with heart failure and with stage 5 chronic kidney disease, or end stage renal disease: Secondary | ICD-10-CM | POA: Diagnosis not present

## 2018-09-13 DIAGNOSIS — K760 Fatty (change of) liver, not elsewhere classified: Secondary | ICD-10-CM | POA: Diagnosis present

## 2018-09-13 DIAGNOSIS — I517 Cardiomegaly: Secondary | ICD-10-CM | POA: Diagnosis not present

## 2018-09-13 DIAGNOSIS — I130 Hypertensive heart and chronic kidney disease with heart failure and stage 1 through stage 4 chronic kidney disease, or unspecified chronic kidney disease: Secondary | ICD-10-CM | POA: Insufficient documentation

## 2018-09-13 DIAGNOSIS — Z794 Long term (current) use of insulin: Secondary | ICD-10-CM

## 2018-09-13 DIAGNOSIS — N183 Chronic kidney disease, stage 3 (moderate): Secondary | ICD-10-CM

## 2018-09-13 DIAGNOSIS — E876 Hypokalemia: Secondary | ICD-10-CM | POA: Diagnosis not present

## 2018-09-13 DIAGNOSIS — Z9989 Dependence on other enabling machines and devices: Secondary | ICD-10-CM

## 2018-09-13 DIAGNOSIS — Z87891 Personal history of nicotine dependence: Secondary | ICD-10-CM

## 2018-09-13 DIAGNOSIS — Z9981 Dependence on supplemental oxygen: Secondary | ICD-10-CM

## 2018-09-13 DIAGNOSIS — Z885 Allergy status to narcotic agent status: Secondary | ICD-10-CM

## 2018-09-13 DIAGNOSIS — Z7189 Other specified counseling: Secondary | ICD-10-CM

## 2018-09-13 DIAGNOSIS — Z66 Do not resuscitate: Secondary | ICD-10-CM | POA: Diagnosis present

## 2018-09-13 DIAGNOSIS — E1129 Type 2 diabetes mellitus with other diabetic kidney complication: Secondary | ICD-10-CM | POA: Diagnosis present

## 2018-09-13 DIAGNOSIS — Z992 Dependence on renal dialysis: Secondary | ICD-10-CM

## 2018-09-13 DIAGNOSIS — Z6841 Body Mass Index (BMI) 40.0 and over, adult: Secondary | ICD-10-CM

## 2018-09-13 DIAGNOSIS — J8410 Pulmonary fibrosis, unspecified: Secondary | ICD-10-CM | POA: Diagnosis not present

## 2018-09-13 DIAGNOSIS — E039 Hypothyroidism, unspecified: Secondary | ICD-10-CM | POA: Diagnosis present

## 2018-09-13 DIAGNOSIS — I10 Essential (primary) hypertension: Secondary | ICD-10-CM | POA: Diagnosis not present

## 2018-09-13 DIAGNOSIS — Z880 Allergy status to penicillin: Secondary | ICD-10-CM

## 2018-09-13 DIAGNOSIS — N186 End stage renal disease: Secondary | ICD-10-CM

## 2018-09-13 DIAGNOSIS — R0602 Shortness of breath: Secondary | ICD-10-CM

## 2018-09-13 DIAGNOSIS — I4819 Other persistent atrial fibrillation: Secondary | ICD-10-CM | POA: Diagnosis present

## 2018-09-13 DIAGNOSIS — E8770 Fluid overload, unspecified: Secondary | ICD-10-CM

## 2018-09-13 DIAGNOSIS — Z955 Presence of coronary angioplasty implant and graft: Secondary | ICD-10-CM

## 2018-09-13 DIAGNOSIS — Z882 Allergy status to sulfonamides status: Secondary | ICD-10-CM

## 2018-09-13 DIAGNOSIS — G4700 Insomnia, unspecified: Secondary | ICD-10-CM | POA: Diagnosis present

## 2018-09-13 DIAGNOSIS — E7849 Other hyperlipidemia: Secondary | ICD-10-CM | POA: Diagnosis not present

## 2018-09-13 LAB — CBC
HCT: 42.3 % (ref 36.0–46.0)
Hemoglobin: 13.2 g/dL (ref 12.0–15.0)
MCH: 31.7 pg (ref 26.0–34.0)
MCHC: 31.2 g/dL (ref 30.0–36.0)
MCV: 101.4 fL — ABNORMAL HIGH (ref 80.0–100.0)
Platelets: 187 10*3/uL (ref 150–400)
RBC: 4.17 MIL/uL (ref 3.87–5.11)
RDW: 15.1 % (ref 11.5–15.5)
WBC: 6.1 10*3/uL (ref 4.0–10.5)
nRBC: 0 % (ref 0.0–0.2)

## 2018-09-13 LAB — COMPREHENSIVE METABOLIC PANEL
ALT: 16 U/L (ref 0–44)
AST: 16 U/L (ref 15–41)
Albumin: 4 g/dL (ref 3.5–5.0)
Alkaline Phosphatase: 52 U/L (ref 38–126)
Anion gap: 12 (ref 5–15)
BUN: 83 mg/dL — ABNORMAL HIGH (ref 8–23)
CO2: 19 mmol/L — ABNORMAL LOW (ref 22–32)
Calcium: 9.4 mg/dL (ref 8.9–10.3)
Chloride: 109 mmol/L (ref 98–111)
Creatinine, Ser: 8.92 mg/dL — ABNORMAL HIGH (ref 0.44–1.00)
GFR calc Af Amer: 5 mL/min — ABNORMAL LOW (ref >60)
GFR calc non Af Amer: 4 mL/min — ABNORMAL LOW (ref >60)
Glucose, Bld: 174 mg/dL — ABNORMAL HIGH (ref 70–99)
Potassium: 4.7 mmol/L (ref 3.5–5.1)
Sodium: 140 mmol/L (ref 135–145)
Total Bilirubin: 0.6 mg/dL (ref 0.3–1.2)
Total Protein: 7.4 g/dL (ref 6.5–8.1)

## 2018-09-13 LAB — LIPID PANEL
Cholesterol: 185 mg/dL (ref 0–200)
HDL: 49 mg/dL (ref >40)
LDL Cholesterol: 90 mg/dL (ref 0–99)
Total CHOL/HDL Ratio: 3.8 RATIO
Triglycerides: 232 mg/dL — ABNORMAL HIGH (ref <150)
VLDL: 46 mg/dL — ABNORMAL HIGH (ref 0–40)

## 2018-09-13 LAB — GLUCOSE, CAPILLARY: Glucose-Capillary: 189 mg/dL — ABNORMAL HIGH (ref 70–99)

## 2018-09-13 LAB — TSH: TSH: 2.797 u[IU]/mL (ref 0.350–4.500)

## 2018-09-13 LAB — CREATININE, URINE, RANDOM: Creatinine, Urine: 54.93 mg/dL

## 2018-09-13 LAB — SODIUM, URINE, RANDOM: Sodium, Ur: 53 mmol/L

## 2018-09-13 MED ORDER — PANTOPRAZOLE SODIUM 40 MG PO TBEC
40.0000 mg | DELAYED_RELEASE_TABLET | Freq: Every day | ORAL | Status: DC
  Administered 2018-09-13 – 2018-09-16 (×4): 40 mg via ORAL
  Filled 2018-09-13 (×4): qty 1

## 2018-09-13 MED ORDER — METOPROLOL TARTRATE 25 MG PO TABS
75.0000 mg | ORAL_TABLET | Freq: Two times a day (BID) | ORAL | Status: DC
  Administered 2018-09-13 – 2018-09-16 (×6): 75 mg via ORAL
  Filled 2018-09-13 (×6): qty 3

## 2018-09-13 MED ORDER — AMIODARONE HCL 200 MG PO TABS
200.0000 mg | ORAL_TABLET | Freq: Every day | ORAL | Status: DC
  Administered 2018-09-14 – 2018-09-16 (×3): 200 mg via ORAL
  Filled 2018-09-13 (×3): qty 1

## 2018-09-13 MED ORDER — INSULIN ASPART 100 UNIT/ML ~~LOC~~ SOLN
0.0000 [IU] | Freq: Three times a day (TID) | SUBCUTANEOUS | Status: DC
  Administered 2018-09-14: 2 [IU] via SUBCUTANEOUS
  Administered 2018-09-14: 3 [IU] via SUBCUTANEOUS
  Administered 2018-09-14 – 2018-09-15 (×2): 2 [IU] via SUBCUTANEOUS
  Administered 2018-09-15 (×2): 3 [IU] via SUBCUTANEOUS
  Administered 2018-09-16: 2 [IU] via SUBCUTANEOUS
  Administered 2018-09-16: 3 [IU] via SUBCUTANEOUS

## 2018-09-13 MED ORDER — ACETAMINOPHEN 325 MG PO TABS: 650.0000 mg | ORAL_TABLET | Freq: Four times a day (QID) | ORAL | Status: DC | PRN

## 2018-09-13 MED ORDER — METOLAZONE 2.5 MG PO TABS
2.5000 mg | ORAL_TABLET | ORAL | Status: DC
  Administered 2018-09-14: 2.5 mg via ORAL
  Filled 2018-09-13: qty 1

## 2018-09-13 MED ORDER — FEBUXOSTAT 40 MG PO TABS
80.0000 mg | ORAL_TABLET | Freq: Every day | ORAL | Status: DC
  Administered 2018-09-14 – 2018-09-16 (×3): 80 mg via ORAL
  Filled 2018-09-13 (×3): qty 2

## 2018-09-13 MED ORDER — ACETAMINOPHEN 650 MG RE SUPP: 650.0000 mg | Freq: Four times a day (QID) | RECTAL | Status: DC | PRN

## 2018-09-13 MED ORDER — HYDRALAZINE HCL 25 MG PO TABS
25.0000 mg | ORAL_TABLET | Freq: Three times a day (TID) | ORAL | Status: DC
  Administered 2018-09-13 – 2018-09-16 (×7): 25 mg via ORAL
  Filled 2018-09-13 (×7): qty 1

## 2018-09-13 MED ORDER — INSULIN GLARGINE 100 UNIT/ML ~~LOC~~ SOLN
30.0000 [IU] | Freq: Every day | SUBCUTANEOUS | Status: DC
  Administered 2018-09-14 – 2018-09-15 (×2): 30 [IU] via SUBCUTANEOUS
  Filled 2018-09-13 (×4): qty 0.3

## 2018-09-13 MED ORDER — DILTIAZEM HCL ER COATED BEADS 180 MG PO CP24
180.0000 mg | ORAL_CAPSULE | Freq: Every day | ORAL | Status: DC
  Administered 2018-09-14 – 2018-09-16 (×3): 180 mg via ORAL
  Filled 2018-09-13 (×3): qty 1

## 2018-09-13 MED ORDER — ONDANSETRON HCL 4 MG PO TABS: 4.0000 mg | ORAL_TABLET | Freq: Four times a day (QID) | ORAL | Status: DC | PRN

## 2018-09-13 MED ORDER — ATORVASTATIN CALCIUM 40 MG PO TABS
40.0000 mg | ORAL_TABLET | Freq: Every day | ORAL | Status: DC
  Administered 2018-09-13 – 2018-09-15 (×3): 40 mg via ORAL
  Filled 2018-09-13 (×3): qty 1

## 2018-09-13 MED ORDER — ONDANSETRON HCL 4 MG/2ML IJ SOLN: 4.0000 mg | Freq: Four times a day (QID) | INTRAMUSCULAR | Status: DC | PRN

## 2018-09-13 MED ORDER — NITROGLYCERIN 0.4 MG SL SUBL: 0.4000 mg | SUBLINGUAL_TABLET | SUBLINGUAL | Status: DC | PRN

## 2018-09-13 MED ORDER — AMITRIPTYLINE HCL 25 MG PO TABS
25.0000 mg | ORAL_TABLET | Freq: Every day | ORAL | Status: DC
  Administered 2018-09-13 – 2018-09-15 (×3): 25 mg via ORAL
  Filled 2018-09-13 (×3): qty 1

## 2018-09-13 MED ORDER — ALBUTEROL SULFATE (2.5 MG/3ML) 0.083% IN NEBU: 2.5000 mg | INHALATION_SOLUTION | Freq: Four times a day (QID) | RESPIRATORY_TRACT | Status: DC | PRN

## 2018-09-13 MED ORDER — ISOSORBIDE MONONITRATE ER 60 MG PO TB24: 60.0000 mg | ORAL_TABLET | Freq: Every day | ORAL | 3 refills | Status: DC

## 2018-09-13 MED ORDER — LEVOTHYROXINE SODIUM 25 MCG PO TABS
25.0000 ug | ORAL_TABLET | Freq: Every day | ORAL | Status: DC
  Administered 2018-09-14 – 2018-09-16 (×3): 25 ug via ORAL
  Filled 2018-09-13 (×3): qty 1

## 2018-09-13 MED ORDER — ZOLPIDEM TARTRATE 5 MG PO TABS
5.0000 mg | ORAL_TABLET | Freq: Every day | ORAL | Status: DC
  Administered 2018-09-13 – 2018-09-15 (×3): 5 mg via ORAL
  Filled 2018-09-13 (×3): qty 1

## 2018-09-13 MED ORDER — ISOSORBIDE MONONITRATE ER 30 MG PO TB24
60.0000 mg | ORAL_TABLET | Freq: Every day | ORAL | Status: DC
  Administered 2018-09-16: 60 mg via ORAL
  Filled 2018-09-13 (×2): qty 2

## 2018-09-13 MED ORDER — APIXABAN 5 MG PO TABS
5.0000 mg | ORAL_TABLET | Freq: Two times a day (BID) | ORAL | Status: DC
  Administered 2018-09-13 – 2018-09-16 (×6): 5 mg via ORAL
  Filled 2018-09-13 (×6): qty 1

## 2018-09-13 MED ORDER — LINACLOTIDE 145 MCG PO CAPS
290.0000 ug | ORAL_CAPSULE | Freq: Every day | ORAL | Status: DC
  Administered 2018-09-14 – 2018-09-16 (×3): 290 ug via ORAL
  Filled 2018-09-13 (×3): qty 2

## 2018-09-13 NOTE — ED Triage Notes (Signed)
Pt here from MD office told to come over to the Ed due to creat of 8.3  Pt runs in the 3 and 4 's , pt does not want dialysis

## 2018-09-13 NOTE — Progress Notes (Signed)
[facility]  Chief Complaint  Patient presents with  . Follow-up    Pt is requesting for new cpap machine, it is over 79yr old. Pt is not breathing well, chest tightness, SOB and wheezing.    Constitutional:  BP 120/84 (BP Location: Left Arm, Cuff Size: Normal)   Pulse 70   Ht 5' 4"  (1.626 m)   Wt 249 lb (112.9 kg)   SpO2 95%   BMI 42.74 kg/m   Past Medical History:  A flutter/fib, HTN, Diastolic CHF, HLD, CAD s/p stent, Fatty liver, GERD, CKD 4  Brief Summary:  Debra HASCALLis a 73y.o. female with ILD from recurrent aspiration, obstructive sleep apnea, and chronic respiratory failure.  She continues to have trouble with her breathing.  She is using 4 liters oxygen.  She needs a POC.  She uses CPAP nightly.  Needs new supplies.    She is followed by nephrology.  Renal function has been getting worse.  She is getting more reflux, hiccups, and pruritus.  She doesn't want to go on dialysis under any circumstances.  Physical Exam:   Appearance - wearing oxygen  ENMT - no sinus tenderness, no nasal discharge, no oral exudate  Neck - no masses, trachea midline, no thyromegaly, no elevation in JVP  Respiratory - normal appearance of chest wall, normal respiratory effort w/o accessory muscle use, decreased BS  CV - s1s2 regular rate and rhythm, no murmurs, 1+ peripheral edema, radial pulses symmetric  GI - soft, non tender  Lymph - no adenopathy noted in neck and axillary areas  MSK - uses a wheelchair  Ext - no cyanosis, clubbing, or joint inflammation noted  Skin - no rashes, lesions, or ulcers  Neuro - normal strength, oriented x 3  Psych - normal mood and affect  CMP Latest Ref Rng & Units 09/13/2018 11/30/2017 10/19/2017  Glucose 70 - 99 mg/dL 174(H) 150(H) 180(H)  BUN 8 - 23 mg/dL 83(H) 49(H) 47(H)  Creatinine 0.44 - 1.00 mg/dL 8.92(H) 4.08(H) 3.78(H)  Sodium 135 - 145 mmol/L 140 141 142  Potassium 3.5 - 5.1 mmol/L 4.7  3.7 3.8  Chloride 98 - 111 mmol/L 109 104 102  CO2 22 - 32 mmol/L 19(L) 23 24  Calcium 8.9 - 10.3 mg/dL 9.4 9.4 9.6  Total Protein 6.5 - 8.1 g/dL 7.4 - -  Total Bilirubin 0.3 - 1.2 mg/dL 0.6 - -  Alkaline Phos 38 - 126 U/L 52 - -  AST 15 - 41 U/L 16 - -  ALT 0 - 44 U/L 16 - -    CBC Latest Ref Rng & Units 09/13/2018 11/30/2017 07/13/2017  WBC 4.0 - 10.5 K/uL 6.1 6.6 6.4  Hemoglobin 12.0 - 15.0 g/dL 13.2 13.6 14.3  Hematocrit 36.0 - 46.0 % 42.3 42.2 43.0  Platelets 150 - 400 K/uL 187 214 199     Assessment/Plan:   Interstitial lung disease. - likely related to reflux with recurrent silent aspiration - defer further imaging studies given her stated desires for goals of care  Chronic hypoxic respiratory failure. - continue 4 liters supplemental oxygen (pulsed) - will arrange for new DME to get her set up for POC  Obstructive sleep apnea. - compliant with CPAP - continue CPAP 10 cm H2O - will need new DME for supplies  A fib/flutter. Diastolic CHF. - followed by cardiology  Goals of care. -  DNR/DNI - had lengthy discussion with her about what will happen as her  renal function gets worse - she was clear that she would not want hemodialysis - will arrange for referral to palliative care to assist with goals of care planning - reviewed the differences between palliative care and hospice care   Patient Instructions  Will arrange for new home care company for CPAP supplies and for a portable oxygen concentrator  Will arrange for referral to palliative care to assist with goals of care planning  Follow up in 6 months  Time spent 31 minutes  Chesley Mires, MD [place] Pager: [phone] 09/13/2018, 2:19 PM  Flow Sheet     Pulmonary tests:  PFT 02/04/15 >> FEV1 1.88 (100%), FEV1% 83, TLC 3.79 (75%), DLCO 28%, +BD Serology 02/16/15 >> anti CCP negative, RF 23, anti Jo negative, Scl 70 negative, SSa/SSb negative, ANA negative, ESR 48 PFT  11/19/15 >> FEV1 1.85 (100%), FEV1% 83, TLC 5.44 (107%), RV 152%, DLCO 30%, borderline BD  Sleep tests:  CPAP 10/22/16 to 11/20/16 >> used on 30 of 30 nights with average 4 hrs 38 min.  Average AHI 3 with CPAP 10 cm H2O  Cardiac tests:  Echo 03/06/14 >> mild LVH, EF 60 to 65% Echo 02/17/15 >> EF 60 to 09%, grade 2 diastolic dysfx, PAS 38 mmHg  Chest imaging:  Esophagram 04/24/14 >> severe esophageal dysmotility, esophageal stasis, small HH HR CT chest 03/26/15 >> patchy GGO with areas of btx, air trapping, 7 mm nodule in lingula CT chest 11/17/16 >> 7 mm nodule lingula, mild b/l BTX, patchy GGO b/l  Medications:   Allergies as of 09/13/2018      Reactions   Adhesive [tape] Itching   EKG leads   Sulfa Antibiotics Itching, Nausea And Vomiting   everything I seen was red   Imdur [isosorbide Dinitrate] Other (See Comments)   headache   Codeine Nausea And Vomiting   Penicillins Nausea And Vomiting      Medication List       Accurate as of September 13, 2018  2:19 PM. Always use your most recent med list.        albuterol (2.5 MG/3ML) 0.083% nebulizer solution Commonly known as:  PROVENTIL Take 3 mLs (2.5 mg total) by nebulization every 6 (six) hours as needed for shortness of breath.   amiodarone 200 MG tablet Commonly known as:  PACERONE Take 1 tablet (200 mg total) by mouth daily.   amitriptyline 25 MG tablet Commonly known as:  ELAVIL Take 25 mg by mouth at bedtime.   atorvastatin 40 MG tablet Commonly known as:  LIPITOR TAKE 1 TABLET BY MOUTH ONCE DAILY   CARTIA XT 180 MG 24 hr capsule Generic drug:  diltiazem TAKE 1 CAPSULE BY MOUTH ONCE DAILY   dicyclomine 10 MG capsule Commonly known as:  BENTYL Take 10 mg by mouth daily.   ELIQUIS 5 MG Tabs tablet Generic drug:  apixaban TAKE 1 TABLET BY MOUTH TWICE DAILY   esomeprazole 40 MG capsule Commonly known as:  NEXIUM TAKE 1 CAPSULE BY MOUTH TWICE DAILY BEFORE MEAL(S)   hydrALAZINE 25 MG tablet Commonly known as:   APRESOLINE TAKE 1 TABLET BY MOUTH THREE TIMES DAILY   insulin glargine 100 UNIT/ML injection Commonly known as:  LANTUS Inject 60 Units into the skin every morning.   isosorbide mononitrate 60 MG 24 hr tablet Commonly known as:  IMDUR Take 1 tablet (60 mg total) by mouth daily.   levothyroxine 25 MCG tablet Commonly known as:  SYNTHROID, LEVOTHROID Take 1 tablet by mouth  daily.   LINZESS 290 MCG Caps capsule Generic drug:  linaclotide   metolazone 2.5 MG tablet Commonly known as:  ZAROXOLYN Take 1 tablet (2.5 mg total) by mouth 2 (two) times a week. Tuesday and Friday   metoprolol tartrate 25 MG tablet Commonly known as:  LOPRESSOR TAKE 3 TABLETS BY MOUTH TWICE DAILY   nitroGLYCERIN 0.4 MG SL tablet Commonly known as:  NITROSTAT Place 0.4 mg under the tongue every 5 (five) minutes as needed for chest pain (x 3 doses). Reported on 02/25/2016   NOVOLOG FLEXPEN 100 UNIT/ML FlexPen Generic drug:  insulin aspart   OXYGEN Inhale 3 L/min into the lungs continuous. Patient uses nightly   potassium chloride SA 20 MEQ tablet Commonly known as:  K-DUR,KLOR-CON Take 20 mEq by mouth 4 (four) times daily. 2 tabs four times daily   RELION PEN NEEDLES 32G X 4 MM Misc Generic drug:  Insulin Pen Needle   torsemide 100 MG tablet Commonly known as:  DEMADEX Take 100 mg by mouth 2 (two) times daily.   ULORIC 80 MG Tabs Generic drug:  Febuxostat Take 1 tablet by mouth daily.   Vitamin D (Ergocalciferol) 1.25 MG (50000 UT) Caps capsule Commonly known as:  DRISDOL Take 50,000 Units by mouth every Monday.   XOPENEX 0.31 MG/3ML nebulizer solution Generic drug:  levalbuterol Take 1 ampule by nebulization every 4 (four) hours as needed for wheezing.   XOPENEX HFA 45 MCG/ACT inhaler Generic drug:  levalbuterol Inhale 2 puffs into the lungs every 4 (four) hours as needed for wheezing or shortness of breath.   zolpidem 10 MG tablet Commonly known as:  AMBIEN Take 10 mg by mouth at  bedtime.       Past Surgical History:  She  has a past surgical history that includes Coronary angioplasty with stent; Breast lumpectomy; Tubal ligation; Tonsillectomy; Total knee arthroplasty (Right, 12/15/2013); Cardioversion (N/A, 03/03/2014); Atrial flutter ablation (2008); Pacemaker insertion (06/18/14); right heart catheterization (N/A, 06/12/2014); permanent pacemaker insertion (N/A, 06/18/2014); Colonoscopy with propofol (N/A, 02/18/2015); Video bronchoscopy (Bilateral, 12/23/2015); and Cardioversion (N/A, 03/30/2017).  Family History:  Her family history includes Bladder Cancer in her mother; Colon cancer in her maternal aunt; Heart disease in her mother; Kidney disease in her mother; Ovarian cancer in her daughter; Stomach cancer in her maternal uncle.  Social History:  She  reports that she quit smoking about 23 years ago. Her smoking use included cigarettes. She has a 17.50 pack-year smoking history. She has never used smokeless tobacco. She reports that she does not drink alcohol or use drugs.

## 2018-09-13 NOTE — Progress Notes (Signed)
Pt refused use of hosp CPAP.  Remains on home regimen of 4 lpm [place].

## 2018-09-13 NOTE — H&P (Signed)
History and Physical    [name] QIH:[id number] DOB: Nov 10, 1945 DOA: 09/13/2018  PCP: Merrilee Seashore, MD  Patient coming from: Home.  Chief Complaint: Increased creatinine.  HPI: [name] is a 73 y.o. female with history of chronic kidney disease stage IV, sleep apnea, interstitial lung disease, CAD, A. fib, hypothyroidism, hypertension, diastolic CHF had followed up with pulmonologist and cardiologist.  At that time patient did complain of increasing shortness of breath which has been progressing over the last few weeks.  Patient cannot lie flat because of shortness of breath.  He also has increasing lower extremity edema.  Patient has known history of chronic disease and decline dialysis previously.  Had routine labs done at cardiology office and was found to have a creatinine which is markedly increased from patient's baseline creatinine of 4 in March 2019 to almost 8.9.  Patient was advised to come to the ER.  ED Course: In the ER patient was not in distress but on exam patient has bilateral lower extremity edema.  Patient clearly states he does not be on dialysis but okay with medications.  EKG shows paced rhythm.  Chest x-ray is pending.  Patient admitted for acute on chronic kidney disease stage V.  Declining dialysis.  Review of Systems: As per HPI, rest all negative.   Past Medical History:  Diagnosis Date  . Anginal pain ([place])    occ; non-ischemic Lexiscan 09/2012  . Arthritis   . Asthma   . Atrial flutter ([place])    ablated by Dr Lovena Le in 2008  . CKD (chronic kidney disease) 04/2007   CKD stage 4(Dr. Erling Cruz)  . Complication of anesthesia     DIFFICULTY BREATHING   . Coronary atherosclerosis of native coronary artery   . Diastolic heart failure 04/7563   grade 2 diastolic dysfunction per 11/3293 echo  . DNI (do not intubate)   . DNR (do not resuscitate)   . Esophageal dysmotility 2008   noted on esophagram.  hx dysphagia.   . Fatty liver 2008   noted  on ultrasound 2008  . Fatty tumor fatty tumor back  . GERD (gastroesophageal reflux disease) 2012   Barrets esophagus on bx 2012 and 2014.   Marland Kitchen Gouty arthropathy   . Heart murmur   . Hyperlipidemia   . Hypertension   . IDDM (insulin dependent diabetes mellitus) ([place])    type 2.   . Morbid obesity ([place])   . Myocardial infarction ([place]) 2009  . Peripheral vascular disease ([place])   . Persistent atrial fibrillation    chads2 vasc score of at least 5  . Sick sinus syndrome ([place])   . Sleep apnea    wears CPAP    Past Surgical History:  Procedure Laterality Date  . ATRIAL FLUTTER ABLATION  2008   CTI ablation by Dr Lovena Le  . BREAST LUMPECTOMY     right  . CARDIOVERSION N/A 03/03/2014   Procedure: CARDIOVERSION;  Surgeon: Laverda Page, MD;  Location: [facility];  Service: Cardiovascular;  Laterality: N/A;  . CARDIOVERSION N/A 03/30/2017   Procedure: CARDIOVERSION;  Surgeon: Larey Dresser, MD;  Location: [facility] ENDOSCOPY;  Service: Cardiovascular;  Laterality: N/A;  . COLONOSCOPY WITH PROPOFOL N/A 02/18/2015   Procedure: COLONOSCOPY WITH PROPOFOL;  Surgeon: Milus Banister, MD;  Location: [facility];  Service: Endoscopy;  Laterality: N/A;  . CORONARY ANGIOPLASTY WITH STENT PLACEMENT    . PACEMAKER INSERTION  06/18/14   STJ Assurity dual chamber pacemaker implanted by  Dr Rayann Heman  . PERMANENT PACEMAKER INSERTION N/A 06/18/2014   SJM Assurity DR pacemaker implanted by Dr Rayann Heman for sick sinus syndrome  . RIGHT HEART CATHETERIZATION N/A 06/12/2014   Procedure: RIGHT HEART CATH;  Surgeon: Jolaine Artist, MD;  Location[facility] CATH LAB;  Service: Cardiovascular;  Laterality: N/A;  . TONSILLECTOMY    . TOTAL KNEE ARTHROPLASTY Right 12/15/2013   Procedure: RIGHT TOTAL KNEE ARTHROPLASTY;  Surgeon: Alta Corning, MD;  Location: [facility];  Service: Orthopedics;  Laterality: Right;  . TUBAL LIGATION    . VIDEO BRONCHOSCOPY Bilateral 12/23/2015   Procedure: VIDEO BRONCHOSCOPY WITH FLUORO;  Surgeon:  Chesley Mires, MD;  Location: [facility] ENDOSCOPY;  Service: Cardiopulmonary;  Laterality: Bilateral;     reports that she quit smoking about 23 years ago. Her smoking use included cigarettes. She has a 17.50 pack-year smoking history. She has never used smokeless tobacco. She reports that she does not drink alcohol or use drugs.  Allergies  Allergen Reactions  . Imdur [Isosorbide Dinitrate] Anaphylaxis and Other (See Comments)    Headache, also   . Adhesive [Tape] Itching    EKG leads  . Sulfa Antibiotics Itching and Nausea And Vomiting    "Everything I saw was red"  . Gabapentin Other (See Comments)    Caused tremors, legs buckled, and couldn't hold onto anything   . Latex Hives  . Lyrica [Pregabalin] Other (See Comments)    Caused tremors, legs buckled, and couldn't hold onto anything   . Codeine Nausea And Vomiting  . Penicillins Nausea And Vomiting    DID THE REACTION INVOLVE: Swelling of the face/tongue/throat, SOB, or low BP? No Sudden or severe rash/hives, skin peeling, or the inside of the mouth or nose? Unk Did it require medical treatment? Unk When did it last happen?Childhood If all above answers are "NO", may proceed with cephalosporin use.     Family History  Problem Relation Age of Onset  . Heart disease Mother   . Bladder Cancer Mother   . Kidney disease Mother   . Ovarian cancer Daughter   . Stomach cancer Maternal Uncle   . Colon cancer Maternal Aunt        dx in her 19's  . Esophageal cancer Neg Hx     Prior to Admission medications   Medication Sig Start Date End Date Taking? Authorizing Provider  acetaminophen (TYLENOL) 500 MG tablet Take 500-1,000 mg by mouth every 6 (six) hours as needed for mild pain or headache.   Yes [provider]  albuterol (PROVENTIL) (2.5 MG/3ML) 0.083% nebulizer solution Take 3 mLs (2.5 mg total) by nebulization every 6 (six) hours as needed for shortness of breath. 08/08/17  Yes Larey Dresser, MD  amiodarone  (PACERONE) 200 MG tablet Take 1 tablet (200 mg total) by mouth daily. 08/20/18  Yes Larey Dresser, MD  amitriptyline (ELAVIL) 25 MG tablet Take 25 mg by mouth at bedtime.     Yes [provider]  atorvastatin (LIPITOR) 40 MG tablet TAKE 1 TABLET BY MOUTH ONCE DAILY Patient taking differently: Take 40 mg by mouth at bedtime.  07/08/18  Yes Larey Dresser, MD  CARTIA XT 180 MG 24 hr capsule TAKE 1 CAPSULE BY MOUTH ONCE DAILY Patient taking differently: Take 180 mg by mouth daily.  08/20/18  Yes Larey Dresser, MD  dicyclomine (BENTYL) 20 MG tablet Take 20 mg by mouth daily at 12 noon.   Yes [provider]  ELIQUIS 5 MG TABS  tablet TAKE 1 TABLET BY MOUTH TWICE DAILY Patient taking differently: Take 5 mg by mouth 2 (two) times daily.  07/08/18  Yes Larey Dresser, MD  esomeprazole (NEXIUM) 40 MG capsule TAKE 1 CAPSULE BY MOUTH TWICE DAILY BEFORE MEAL(S) Patient taking differently: Take 40 mg by mouth daily before breakfast.  03/19/17  Yes Armbruster, Carlota Raspberry, MD  hydrALAZINE (APRESOLINE) 25 MG tablet TAKE 1 TABLET BY MOUTH THREE TIMES DAILY Patient taking differently: Take 25 mg by mouth 3 (three) times daily.  03/18/18  Yes Larey Dresser, MD  insulin glargine (LANTUS) 100 UNIT/ML injection Inject 60 Units into the skin daily after breakfast.    Yes [provider]  levothyroxine (SYNTHROID, LEVOTHROID) 25 MCG tablet Take 25 mcg by mouth daily before lunch.  04/26/15  Yes [provider]  LINZESS 290 MCG CAPS capsule Take 290 mcg by mouth daily before breakfast.  06/02/16  Yes [provider]  metolazone (ZAROXOLYN) 2.5 MG tablet Take 1 tablet (2.5 mg total) by mouth 2 (two) times a week. Tuesday and Friday Patient taking differently: Take 2.5 mg by mouth 2 (two) times a week. TUESDAYS and SATURDAYS 10/01/17  Yes Larey Dresser, MD  metoprolol tartrate (LOPRESSOR) 25 MG tablet TAKE 3 TABLETS BY MOUTH TWICE DAILY Patient taking differently: Take 75  mg by mouth See admin instructions. Take 75 mg by mouth two times a day- 5:30 PM and 9:30 PM 03/21/18  Yes Bensimhon, Shaune Pascal, MD  nitroGLYCERIN (NITROSTAT) 0.4 MG SL tablet Place 0.4 mg under the tongue every 5 (five) minutes x 3 doses as needed for chest pain.    Yes [provider]  NOVOLOG FLEXPEN 100 UNIT/ML FlexPen Inject 40 Units into the skin 3 (three) times daily as needed (for a BGL of 200 or greater).  07/23/16  Yes [provider]  OXYGEN Inhale 4 L into the lungs continuous.    Yes [provider]  potassium chloride SA (K-DUR,KLOR-CON) 20 MEQ tablet Take 40 mEq by mouth 4 (four) times daily.    Yes [provider]  torsemide (DEMADEX) 100 MG tablet Take 100 mg by mouth 2 (two) times daily.   Yes [provider]  ULORIC 80 MG TABS Take 80 mg by mouth daily.  01/02/16  Yes [provider]  Vitamin D, Ergocalciferol, (DRISDOL) 50000 UNITS CAPS capsule Take 50,000 Units by mouth every Monday.   Yes [provider]  XOPENEX HFA 45 MCG/ACT inhaler Inhale 2 puffs into the lungs every 4 (four) hours as needed for wheezing or shortness of breath. 03/11/14  Yes Whiteheart, Cristal Ford, NP  zolpidem (AMBIEN) 10 MG tablet Take 10 mg by mouth at bedtime.    Yes [provider]  isosorbide mononitrate (IMDUR) 60 MG 24 hr tablet Take 1 tablet (60 mg total) by mouth daily. 09/13/18 12/12/18  Georgiana Shore, NP  RELION PEN NEEDLES 32G X 4 MM MISC  07/19/16   [provider]    Physical Exam: Vitals:   09/13/18 1607 09/13/18 1715 09/13/18 1730 09/13/18 2030  BP: (!) 141/65 (!) 114/45 129/64 (!) 148/87  Pulse: 70 70 70 70  Resp: 16 18 19 20   Temp: 98.1 F (36.7 C)     TempSrc: Oral     SpO2: 97% 97% 97% 95%      Constitutional: Moderately built and nourished. Vitals:   09/13/18 1607 09/13/18 1715 09/13/18 1730 09/13/18 2030  BP: (!) 141/65 (!) 114/45 129/64 (!) 148/87  Pulse: 70 70 70 70  Resp: 16 18 19 20   Temp:  98.1 F (36.7 C)     TempSrc: Oral     SpO2: 97% 97% 97% 95%   Eyes: Anicteric no pallor. ENMT: No discharge from the ears eyes nose or mouth. Neck: No JVD appreciated no mass felt. Respiratory: No rhonchi or crepitations. Cardiovascular: S1-S2 heard. Abdomen: Soft nontender bowel sounds present. Musculoskeletal: Bilateral lower extremity edema present. Skin: No rash. Neurologic: Alert awake oriented to time place and person.  Moves all extremities. Psychiatric: Appears normal.  Normal affect.   Labs on Admission: I have personally reviewed following labs and imaging studies  CBC: Recent Labs  Lab 09/13/18 1038  WBC 6.1  HGB 13.2  HCT 42.3  MCV 101.4*  PLT 458   Basic Metabolic Panel: Recent Labs  Lab 09/13/18 1037  NA 140  K 4.7  CL 109  CO2 19*  GLUCOSE 174*  BUN 83*  CREATININE 8.92*  CALCIUM 9.4   GFR: Estimated Creatinine Clearance: 7 mL/min (A) (by C-G formula based on SCr of 8.92 mg/dL (H)). Liver Function Tests: Recent Labs  Lab 09/13/18 1037  AST 16  ALT 16  ALKPHOS 52  BILITOT 0.6  PROT 7.4  ALBUMIN 4.0   No results for input(s): LIPASE, AMYLASE in the last 168 hours. No results for input(s): AMMONIA in the last 168 hours. Coagulation Profile: No results for input(s): INR, PROTIME in the last 168 hours. Cardiac Enzymes: No results for input(s): CKTOTAL, CKMB, CKMBINDEX, TROPONINI in the last 168 hours. BNP (last 3 results) No results for input(s): PROBNP in the last 8760 hours. HbA1C: No results for input(s): HGBA1C in the last 72 hours. CBG: No results for input(s): GLUCAP in the last 168 hours. Lipid Profile: Recent Labs    09/13/18 1037  CHOL 185  HDL 49  LDLCALC 90  TRIG 232*  CHOLHDL 3.8   Thyroid Function Tests: Recent Labs    09/13/18 1039  TSH 2.797   Anemia Panel: No results for input(s): VITAMINB12, FOLATE, FERRITIN, TIBC, IRON, RETICCTPCT in the last 72 hours. Urine analysis:    Component Value Date/Time    COLORURINE YELLOW 12/09/2013 1312   APPEARANCEUR CLEAR 12/09/2013 1312   LABSPEC 1.017 12/09/2013 1312   PHURINE 6.0 12/09/2013 1312   GLUCOSEU NEGATIVE 12/09/2013 1312   HGBUR NEGATIVE 12/09/2013 1312   BILIRUBINUR NEGATIVE 12/09/2013 1312   KETONESUR NEGATIVE 12/09/2013 1312   PROTEINUR NEGATIVE 12/09/2013 1312   UROBILINOGEN 1.0 12/09/2013 1312   NITRITE NEGATIVE 12/09/2013 1312   LEUKOCYTESUR SMALL (A) 12/09/2013 1312   Sepsis Labs: @LABRCNTIP (procalcitonin:4,lacticidven:4) )No results found for this or any previous visit (from the past 240 hour(s)).   Radiological Exams on Admission: No results found.  EKG: Independently reviewed.  Paced rhythm.  Assessment/Plan Principal Problem:   ARF (acute renal failure) (HCC) Active Problems:   Obstructive sleep apnea   CKD (chronic kidney disease) stage 5, GFR less than 15 ml/min (HCC)   DM (diabetes mellitus), type 2 with renal complications ([place])   Hypertension   Hyperlipidemia   Atrial fibrillation ([place])   Pulmonary fibrosis ([place])    1. Acute on chronic kidney disease stage V -patient declining dialysis.  At this time I have placed patient on Lasix 120 mg IV every 12.  May discuss with nephrology in the morning.  Consult palliative care for goals of care.  I have also ordered ultrasound of the kidneys to make sure there is no obstruction. 2. Diabetes  mellitus type 2 we will decrease Lantus dose by half since creatinine is worsening.  Sliding scale coverage. 3. Atrial fibrillation and tachybradycardia syndrome status post pacemaker on amiodarone metoprolol Cardizem and apixaban.  Discussed with pharmacy about apixaban dose at this time apixaban will be continued at home dose. 4. Hyperlipidemia on statins. 5. Sleep apnea on CPAP. 6. 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 7. CAD denies any chest pain.   DVT prophylaxis: Apixaban. Code Status: Full code.  Note that patient has told  pulmonologist about being DNR but today at the hospital patient is a full code.  Will be further decided later. Family Communication: Patient's daughter. Disposition Plan: Home. Consults called: Palliative care. Admission status: Inpatient.   Rise Patience MD [facility] Pager [phone].  If 7PM-7AM, please contact night-coverage [email or web address] Password [facility]  09/13/2018, 8:54 PM

## 2018-09-13 NOTE — Patient Instructions (Signed)
Will arrange for new home care company for CPAP supplies and for a portable oxygen concentrator  Will arrange for referral to palliative care to assist with goals of care planning  Follow up in 6 months

## 2018-09-13 NOTE — Patient Instructions (Signed)
START Imdur 60 mg, one tab daily  Labs today We will only contact you if something comes back abnormal or we need to make some changes. Otherwise no news is good news!  Your physician has requested that you have an echocardiogram. Echocardiography is a painless test that uses sound waves to create images of your heart. It provides your doctor with information about the size and shape of your heart and how well your heart's chambers and valves are working. This procedure takes approximately one hour. There are no restrictions for this procedure.    Your physician recommends that you schedule a follow-up appointment in: 2 months with Dr Aundra Dubin  Do the following things EVERYDAY: 1) Weigh yourself in the morning before breakfast. Write it down and keep it in a log. 2) Take your medicines as prescribed 3) Eat low salt foods-Limit salt (sodium) to 2000 mg per day.  4) Stay as active as you can everyday 5) Limit all fluids for the day to less than 2 liters

## 2018-09-13 NOTE — ED Notes (Signed)
Pt ambulated to restroom with sample cup.

## 2018-09-13 NOTE — ED Provider Notes (Signed)
[facility]ERG[facility]Y DEPARTMENT Provider Note   CSN: [id number] Arrival date & time: 09/13/18  1558     History   Chief Complaint No chief complaint on file.   HPI [name] is a 73 y.o. female.  HPI   73 y.o. female with history of chronic kidney disease stage IV, sleep apnea, interstitial lung disease, CAD, A. fib, hypothyroidism, hypertension, diastolic CHF had followed up with pulmonologist and cardiologist.  At that time patient did complain of increasing shortness of breath which has been progressing over the last few weeks.  Patient cannot lie flat because of shortness of breath.  He also has increasing lower extremity edema.  Patient has known history of chronic disease and decline dialysis previously.  Had routine labs done at cardiology office and was found to have a creatinine which is markedly increased from patient's baseline creatinine of 4 in March 2019 to almost 8.9.  Patient was advised to come to the ER.  Past Medical History:  Diagnosis Date  . Anginal pain ([place])    occ; non-ischemic Lexiscan 09/2012  . Arthritis   . Asthma   . Atrial flutter ([place])    ablated by Dr Lovena Le in 2008  . CKD (chronic kidney disease) 04/2007   CKD stage 4(Dr. Erling Cruz)  . Complication of anesthesia     DIFFICULTY BREATHING   . Coronary atherosclerosis of native coronary artery   . Diastolic heart failure 04/5884   grade 2 diastolic dysfunction per 0/2774 echo  . DNI (do not intubate)   . DNR (do not resuscitate)   . Esophageal dysmotility 2008   noted on esophagram.  hx dysphagia.   . Fatty liver 2008   noted on ultrasound 2008  . Fatty tumor fatty tumor back  . GERD (gastroesophageal reflux disease) 2012   Barrets esophagus on bx 2012 and 2014.   Marland Kitchen Gouty arthropathy   . Heart murmur   . Hyperlipidemia   . Hypertension   . IDDM (insulin dependent diabetes mellitus) ([place])    type 2.   . Morbid obesity ([place])   . Myocardial infarction ([place]) 2009    . Peripheral vascular disease ([place])   . Persistent atrial fibrillation    chads2 vasc score of at least 5  . Sick sinus syndrome ([place])   . Sleep apnea    wears CPAP    Patient Active Problem List   Diagnosis Date Noted  . Right shoulder pain 11/09/2017  . Paresthesia 11/09/2017  . BRBPR (bright red blood per rectum) 01/19/2017  . Claudication in peripheral vascular disease ([place]) 01/19/2017  . Chronic respiratory failure ([place]) 01/14/2016  . Chronic cough   . Bronchomalacia, acquired   . Lung nodule 11/16/2015  . Pulmonary fibrosis ([place]) 10/24/2015  . ILD (interstitial lung disease) ([place]) 05/07/2015  . Dyspnea 03/18/2015  . Acute on chronic diastolic congestive heart failure ([place]) 02/16/2015  . Symptomatic anemia 02/16/2015  . Morbid obesity ([place]) 10/05/2014  . Tachy-brady syndrome ([place]) 06/30/2014  . Abnormal TSH 06/17/2014  . Diarrhea 06/16/2014  . Chronic respiratory failure with hypoxia ([place]) 06/09/2014  . Sick sinus syndrome ([place]) 05/25/2014  . Atrial fibrillation ([place]) 03/18/2014  . Chronic diastolic heart failure ([place]) 03/16/2014  . Hypertension 03/16/2014  . IBS (irritable bowel syndrome) 03/16/2014  . Hyperlipidemia 03/16/2014  . CKD (chronic kidney disease) stage 5, GFR less than 15 ml/min (HCC) 12/17/2013  . DM (diabetes mellitus), type 2 with renal complications ([place]) 12/87/8676  . Osteoarthritis of  left knee 12/15/2013  . Osteoarthritis of right knee 12/15/2013  . GERD (gastroesophageal reflux disease) 07/29/2013  . Obstructive sleep apnea 07/29/2013  . [name]'s esophagus 07/29/2013  . Fatty tumor     Past Surgical History:  Procedure Laterality Date  . ATRIAL FLUTTER ABLATION  2008   CTI ablation by Dr Lovena Le  . BREAST LUMPECTOMY     right  . CARDIOVERSION N/A 03/03/2014   Procedure: CARDIOVERSION;  Surgeon: Laverda Page, MD;  Location: [facility];  Service: Cardiovascular;  Laterality: N/A;  . CARDIOVERSION N/A 03/30/2017   Procedure:  CARDIOVERSION;  Surgeon: Larey Dresser, MD;  Location: [facility] ENDOSCOPY;  Service: Cardiovascular;  Laterality: N/A;  . COLONOSCOPY WITH PROPOFOL N/A 02/18/2015   Procedure: COLONOSCOPY WITH PROPOFOL;  Surgeon: Milus Banister, MD;  Location: [facility];  Service: Endoscopy;  Laterality: N/A;  . CORONARY ANGIOPLASTY WITH STENT PLACEMENT    . PACEMAKER INSERTION  06/18/14   STJ Assurity dual chamber pacemaker implanted by Dr Rayann Heman  . PERMANENT PACEMAKER INSERTION N/A 06/18/2014   SJM Assurity DR pacemaker implanted by Dr Rayann Heman for sick sinus syndrome  . RIGHT HEART CATHETERIZATION N/A 06/12/2014   Procedure: RIGHT HEART CATH;  Surgeon: Jolaine Artist, MD;  Location: [facility] CATH LAB;  Service: Cardiovascular;  Laterality: N/A;  . TONSILLECTOMY    . TOTAL KNEE ARTHROPLASTY Right 12/15/2013   Procedure: RIGHT TOTAL KNEE ARTHROPLASTY;  Surgeon: Alta Corning, MD;  Location: [facility];  Service: Orthopedics;  Laterality: Right;  . TUBAL LIGATION    . VIDEO BRONCHOSCOPY Bilateral 12/23/2015   Procedure: VIDEO BRONCHOSCOPY WITH FLUORO;  Surgeon: Chesley Mires, MD;  Location: [facility] ENDOSCOPY;  Service: Cardiopulmonary;  Laterality: Bilateral;     OB History   No obstetric history on file.      Home Medications    Prior to Admission medications   Medication Sig Start Date End Date Taking? Authorizing Provider  albuterol (PROVENTIL) (2.5 MG/3ML) 0.083% nebulizer solution Take 3 mLs (2.5 mg total) by nebulization every 6 (six) hours as needed for shortness of breath. 08/08/17   Larey Dresser, MD  amiodarone (PACERONE) 200 MG tablet Take 1 tablet (200 mg total) by mouth daily. 08/20/18   Larey Dresser, MD  amitriptyline (ELAVIL) 25 MG tablet Take 25 mg by mouth at bedtime.      [provider]  atorvastatin (LIPITOR) 40 MG tablet TAKE 1 TABLET BY MOUTH ONCE DAILY 07/08/18   Larey Dresser, MD  CARTIA XT 180 MG 24 hr capsule TAKE 1 CAPSULE BY MOUTH ONCE DAILY 08/20/18   Larey Dresser, MD    dicyclomine (BENTYL) 10 MG capsule Take 10 mg by mouth daily.     [provider]  ELIQUIS 5 MG TABS tablet TAKE 1 TABLET BY MOUTH TWICE DAILY 07/08/18   Larey Dresser, MD  esomeprazole (NEXIUM) 40 MG capsule TAKE 1 CAPSULE BY MOUTH TWICE DAILY BEFORE MEAL(S) 03/19/17   Armbruster, Carlota Raspberry, MD  hydrALAZINE (APRESOLINE) 25 MG tablet TAKE 1 TABLET BY MOUTH THREE TIMES DAILY 03/18/18   Larey Dresser, MD  insulin glargine (LANTUS) 100 UNIT/ML injection Inject 60 Units into the skin every morning.     [provider]  isosorbide mononitrate (IMDUR) 60 MG 24 hr tablet Take 1 tablet (60 mg total) by mouth daily. 09/13/18 12/12/18  Georgiana Shore, NP  levalbuterol Penne Lash) 0.31 MG/3ML nebulizer solution Take 1 ampule by nebulization every 4 (four) hours as needed for wheezing.  [provider]  levothyroxine (SYNTHROID, LEVOTHROID) 25 MCG tablet Take 1 tablet by mouth daily. 04/26/15   [provider]  Rolan Lipa 290 MCG CAPS capsule  06/02/16   [provider]  metolazone (ZAROXOLYN) 2.5 MG tablet Take 1 tablet (2.5 mg total) by mouth 2 (two) times a week. Tuesday and Friday Patient taking differently: Take 2.5 mg by mouth 2 (two) times a week. Tuesday and saturday 10/01/17   Larey Dresser, MD  metoprolol tartrate (LOPRESSOR) 25 MG tablet TAKE 3 TABLETS BY MOUTH TWICE DAILY 03/21/18   Bensimhon, Shaune Pascal, MD  nitroGLYCERIN (NITROSTAT) 0.4 MG SL tablet Place 0.4 mg under the tongue every 5 (five) minutes as needed for chest pain (x 3 doses). Reported on 02/25/2016    [provider]  NOVOLOG FLEXPEN 100 UNIT/ML FlexPen  07/23/16   [provider]  OXYGEN Inhale 3 L/min into the lungs continuous. Patient uses nightly    [provider]  potassium chloride SA (K-DUR,KLOR-CON) 20 MEQ tablet Take 20 mEq by mouth 4 (four) times daily. 2 tabs four times daily    [provider]  RELION PEN NEEDLES 32G X 4 MM MISC  07/19/16    [provider]  torsemide (DEMADEX) 100 MG tablet Take 100 mg by mouth 2 (two) times daily.    [provider]  ULORIC 80 MG TABS Take 1 tablet by mouth daily.  01/02/16   [provider]  Vitamin D, Ergocalciferol, (DRISDOL) 50000 UNITS CAPS capsule Take 50,000 Units by mouth every Monday.    [provider]  XOPENEX HFA 45 MCG/ACT inhaler Inhale 2 puffs into the lungs every 4 (four) hours as needed for wheezing or shortness of breath. 03/11/14   Whiteheart, Cristal Ford, NP  zolpidem (AMBIEN) 10 MG tablet Take 10 mg by mouth at bedtime.     [provider]    Family History Family History  Problem Relation Age of Onset  . Heart disease Mother   . Bladder Cancer Mother   . Kidney disease Mother   . Ovarian cancer Daughter   . Stomach cancer Maternal Uncle   . Colon cancer Maternal Aunt        dx in her 83's  . Esophageal cancer Neg Hx     Social History Social History   Tobacco Use  . Smoking status: Former Smoker    Packs/day: 0.50    Years: 35.00    Pack years: 17.50    Types: Cigarettes    Last attempt to quit: 02/16/1995    Years since quitting: 23.5  . Smokeless tobacco: Never Used  . Tobacco comment: 05/2014  QUIT OVER 20 YEARS AGO "  Substance Use Topics  . Alcohol use: No    Alcohol/week: 0.0 standard drinks  . Drug use: No     Allergies   Adhesive [tape]; Sulfa antibiotics; Imdur [isosorbide dinitrate]; Codeine; and Penicillins   Review of Systems Review of Systems  All systems reviewed and negative, other than as noted in HPI.  Physical Exam Updated Vital Signs BP 129/64   Pulse 70   Temp 98.1 F (36.7 C) (Oral)   Resp 19   SpO2 97%   Physical Exam Vitals signs and nursing note reviewed.  Constitutional:      General: She is not in acute distress.    Appearance: She is well-developed.  HENT:     Head: Normocephalic and atraumatic.  Eyes:     General:  Right eye: No discharge.        Left eye:  No discharge.     Conjunctiva/sclera: Conjunctivae normal.  Neck:     Musculoskeletal: Neck supple.  Cardiovascular:     Rate and Rhythm: Normal rate and regular rhythm.     Heart sounds: Normal heart sounds. No murmur. No friction rub. No gallop.   Pulmonary:     Breath sounds: Rales present.  Abdominal:     General: There is no distension.     Palpations: Abdomen is soft.     Tenderness: There is no abdominal tenderness.  Musculoskeletal:        General: No tenderness.     Right lower leg: Edema present.     Left lower leg: Edema present.  Skin:    General: Skin is warm and dry.  Neurological:     Mental Status: She is alert.  Psychiatric:        Behavior: Behavior normal.        Thought Content: Thought content normal.      ED Treatments / Results  Labs (all labs ordered are listed, but only abnormal results are displayed) Labs Reviewed  BASIC METABOLIC PANEL - Abnormal; Notable for the following components:      Result Value   CO2 19 (*)    Glucose, Bld 182 (*)    BUN 84 (*)    Creatinine, Ser 9.06 (*)    GFR calc non Af Amer 4 (*)    GFR calc Af Amer 5 (*)    All other components within normal limits  CBC - Abnormal; Notable for the following components:   RBC 3.66 (*)    Hemoglobin 11.8 (*)    nRBC 0.3 (*)    All other components within normal limits  GLUCOSE, CAPILLARY - Abnormal; Notable for the following components:   Glucose-Capillary 189 (*)    All other components within normal limits  GLUCOSE, CAPILLARY - Abnormal; Notable for the following components:   Glucose-Capillary 178 (*)    All other components within normal limits  GLUCOSE, CAPILLARY - Abnormal; Notable for the following components:   Glucose-Capillary 207 (*)    All other components within normal limits  GLUCOSE, CAPILLARY - Abnormal; Notable for the following components:   Glucose-Capillary 176 (*)    All other components within normal limits  BASIC METABOLIC PANEL - Abnormal; Notable  for the following components:   Potassium 3.4 (*)    Glucose, Bld 185 (*)    BUN 84 (*)    Creatinine, Ser 9.00 (*)    GFR calc non Af Amer 4 (*)    GFR calc Af Amer 5 (*)    All other components within normal limits  BRAIN NATRIURETIC PEPTIDE - Abnormal; Notable for the following components:   B Natriuretic Peptide 149.7 (*)    All other components within normal limits  GLUCOSE, CAPILLARY - Abnormal; Notable for the following components:   Glucose-Capillary 198 (*)    All other components within normal limits  GLUCOSE, CAPILLARY - Abnormal; Notable for the following components:   Glucose-Capillary 209 (*)    All other components within normal limits  GLUCOSE, CAPILLARY - Abnormal; Notable for the following components:   Glucose-Capillary 230 (*)    All other components within normal limits  GLUCOSE, CAPILLARY - Abnormal; Notable for the following components:   Glucose-Capillary 172 (*)    All other components within normal limits  BASIC METABOLIC PANEL - Abnormal; Notable for  the following components:   Potassium 2.8 (*)    Glucose, Bld 206 (*)    BUN 86 (*)    Creatinine, Ser 9.18 (*)    Calcium 8.7 (*)    GFR calc non Af Amer 4 (*)    GFR calc Af Amer 4 (*)    Anion gap 16 (*)    All other components within normal limits  GLUCOSE, CAPILLARY - Abnormal; Notable for the following components:   Glucose-Capillary 285 (*)    All other components within normal limits  GLUCOSE, CAPILLARY - Abnormal; Notable for the following components:   Glucose-Capillary 193 (*)    All other components within normal limits  GLUCOSE, CAPILLARY - Abnormal; Notable for the following components:   Glucose-Capillary 242 (*)    All other components within normal limits  SODIUM, URINE, RANDOM  CREATININE, URINE, RANDOM    EKG EKG Interpretation  Date/Time:  Friday September 13 2018 16:03:35 EST Ventricular Rate:  70 PR Interval:  256 QRS Duration: 104 QT Interval:  424 QTC  Calculation: 457 R Axis:   39 Text Interpretation:  Atrial-paced rhythm with prolonged AV conduction Abnormal ECG Confirmed by Virgel Manifold [phone]) on 09/13/2018 6:36:59 PM   Radiology No results found.   US Renal  Result Date: 09/14/2018 CLINICAL DATA:  Patient with acute renal failure. EXAM: RENAL / URINARY TRACT ULTRASOUND COMPLETE COMPARISON:  CT abdomen pelvis 02/17/2015 FINDINGS: Right Kidney: Renal measurements: 8.0 x 4.9 x 4.7 cm = volume: 94.5 mL. Normal renal cortical thickness and echogenicity. No hydronephrosis. Multiple 1 cm or smaller hypoechoic lesions within the right kidney, incompletely characterized. Left Kidney: Renal measurements: 9.8 x 5.1 x 4.9 cm = volume: 127.1 mL. Normal renal cortical thickness and echogenicity. No hydronephrosis. Hypoechoic lesion measuring up to 1 cm, incompletely characterized. Bladder: Appears normal for degree of bladder distention. IMPRESSION: No hydronephrosis. Electronically Signed   By: Lovey Newcomer M.D.   On: 09/14/2018 10:13   Dg Chest Port 1 View  Result Date: 09/13/2018 CLINICAL DATA:  Elevated creatinine. EXAM: PORTABLE CHEST 1 VIEW COMPARISON:  Chest x-ray dated December 16, 2015. FINDINGS: Unchanged left chest wall pacemaker. Stable cardiomegaly. Normal pulmonary vascularity. No focal consolidation, pleural effusion, or pneumothorax. No acute osseous abnormality. IMPRESSION: No active disease. Electronically Signed   By: Titus Dubin M.D.   On: 09/13/2018 21:03    Procedures Procedures (including critical care time)  Medications Ordered in ED Medications - No data to display   Initial Impression / Assessment and Plan / ED Course  I have reviewed the triage vital signs and the nursing notes.  Pertinent labs & imaging results that were available during my care of the patient were reviewed by me and considered in my medical decision making (see chart for details).     72yF with multiple medical problems complicated by worsening  renal function. At point where she isn't responding to diuretics. Symptomatically she is worsening (fatigue, dyspnea, discomfort from swelling, anorexia, etc). Other providers and myself discussed with her the need for HD and realistic expectation that her symptoms would progress fairly quickly and she would ultimately die without it. She seems to understand these risks but I don't think she has specifically discussed with nephrology recently.   Final Clinical Impressions(s) / ED Diagnoses   Final diagnoses:  Type 2 diabetes mellitus with stage 3 chronic kidney disease, unspecified whether long term insulin use (HCC)  ESRD on dialysis ([place])  Hypervolemia, unspecified hypervolemia type    ED  Discharge Orders    None       Virgel Manifold, MD 09/22/18 1429

## 2018-09-13 NOTE — Patient Outreach (Addendum)
[facility]) Care Management  [place]   09/13/2018  MICHALLE RADEMAKER 1946-04-05 [id number]  Reason for referral: Medication Management, assistance with adherence due to memory loss, assistance with obtaining diabetic supplies  Referral source: [facility] RN Current insurance:[employer]  PMHx includes but not limited to: CKD IV-V (baseline Scr ~4), HFpEF- NYHA III (last EF 55-60% in 03/2017), atrial fibrillation on chronic anticoagulation, PPM placement, T2DM, HTN, HLD, OSA, ILD, CAD s/p DES placement in 2012, GERD, and IBS.  Per notes, patient has denied wanted HD in the past.    Per review of [facility], patient had office visits today with [facility] and pulmonologist.   Pulmonologist is referring patient to palliative care.    Labs drawn and Scr 8.9 found to be significantly elevated. Patient called / left message to go to ED for evaluation.     Patient's PCP, Dr. Ashby Dawes, is not in [facility], therefore unable to review recent office notes.   Per review of current medication list in [facility] (which may not reflect all medications from PCP)   1. Eliquis 5mg  BID: Per PI, dose reduction to 2.5mg  BID with 2 of the 3 following paramters: age > 18 YO, SCr > 1.5 mg/dL, or weight < 60kg.  Patients in clinical trials were excluded if Scr >2.5. May consider reducing dose or changing to warfarin due to elevated SCr / CKD-IV-V.   2. Uloric 80mg  daily. Per PI, for CrCl < 30 mL/min, maximum dose 40mg  once daily.  Also has BBW for increased risk of heart-related death and death from all causes compared to allopurinol. Consider reducing dose based on renal function or changing therapy as clinically warranted.   3. Zolpidem 10mg  daily. Per PI, avoid use in geriatrics due to CNS side effects. Consider dose reduction to 5mg  daily if clinically warranted.   Outreach:  Unsuccessful telephone call to Ms. Insalaco.  Message left for patient instructing her to go to emergency room due to worsening  kidney function as recommended by cardiology NP.  Plan: Will follow-up with patient again within 3-4 business days.  Unsuccessful outreach letter already mailed to patient by [facility] pharmacist Denyse Amass. Will route note to PCP.   [place] Faster PharmD Candidate Class of 2020  [employer]  Agree with above Ralene Bathe, PharmD, [place] [phone]

## 2018-09-14 ENCOUNTER — Inpatient Hospital Stay (HOSPITAL_COMMUNITY): Payer: HMO

## 2018-09-14 DIAGNOSIS — G4733 Obstructive sleep apnea (adult) (pediatric): Secondary | ICD-10-CM

## 2018-09-14 DIAGNOSIS — N185 Chronic kidney disease, stage 5: Secondary | ICD-10-CM

## 2018-09-14 DIAGNOSIS — I10 Essential (primary) hypertension: Secondary | ICD-10-CM

## 2018-09-14 DIAGNOSIS — J8410 Pulmonary fibrosis, unspecified: Secondary | ICD-10-CM

## 2018-09-14 DIAGNOSIS — E7849 Other hyperlipidemia: Secondary | ICD-10-CM

## 2018-09-14 LAB — GLUCOSE, CAPILLARY
Glucose-Capillary: 178 mg/dL — ABNORMAL HIGH (ref 70–99)
Glucose-Capillary: 207 mg/dL — ABNORMAL HIGH (ref 70–99)
Glucose-Capillary: 176 mg/dL — ABNORMAL HIGH (ref 70–99)
Glucose-Capillary: 198 mg/dL — ABNORMAL HIGH (ref 70–99)

## 2018-09-14 LAB — BASIC METABOLIC PANEL
Anion gap: 11 (ref 5–15)
BUN: 84 mg/dL — ABNORMAL HIGH (ref 8–23)
CO2: 19 mmol/L — ABNORMAL LOW (ref 22–32)
Calcium: 8.9 mg/dL (ref 8.9–10.3)
Chloride: 109 mmol/L (ref 98–111)
Creatinine, Ser: 9.06 mg/dL — ABNORMAL HIGH (ref 0.44–1.00)
GFR calc Af Amer: 5 mL/min — ABNORMAL LOW (ref >60)
GFR calc non Af Amer: 4 mL/min — ABNORMAL LOW (ref >60)
Glucose, Bld: 182 mg/dL — ABNORMAL HIGH (ref 70–99)
Potassium: 4.1 mmol/L (ref 3.5–5.1)
Sodium: 139 mmol/L (ref 135–145)

## 2018-09-14 LAB — CBC
HCT: 36.3 % (ref 36.0–46.0)
Hemoglobin: 11.8 g/dL — ABNORMAL LOW (ref 12.0–15.0)
MCH: 32.2 pg (ref 26.0–34.0)
MCHC: 32.5 g/dL (ref 30.0–36.0)
MCV: 99.2 fL (ref 80.0–100.0)
Platelets: 171 10*3/uL (ref 150–400)
RBC: 3.66 MIL/uL — ABNORMAL LOW (ref 3.87–5.11)
RDW: 14.9 % (ref 11.5–15.5)
WBC: 6.3 10*3/uL (ref 4.0–10.5)
nRBC: 0.3 % — ABNORMAL HIGH (ref 0.0–0.2)

## 2018-09-14 MED ORDER — FUROSEMIDE 10 MG/ML IJ SOLN
120.0000 mg | Freq: Two times a day (BID) | INTRAVENOUS | Status: DC
  Administered 2018-09-14 – 2018-09-15 (×3): 120 mg via INTRAVENOUS
  Filled 2018-09-14: qty 2
  Filled 2018-09-14: qty 10
  Filled 2018-09-14: qty 4

## 2018-09-14 MED ORDER — SODIUM CHLORIDE 0.9 % IV SOLN
INTRAVENOUS | Status: DC | PRN
  Administered 2018-09-14 – 2018-09-15 (×3): 250 mL via INTRAVENOUS

## 2018-09-14 MED ORDER — IPRATROPIUM BROMIDE 0.06 % NA SOLN
2.0000 | Freq: Two times a day (BID) | NASAL | Status: DC
  Administered 2018-09-15 – 2018-09-16 (×2): 2 via NASAL
  Filled 2018-09-14: qty 30

## 2018-09-14 MED ORDER — ORAL CARE MOUTH RINSE
15.0000 mL | Freq: Two times a day (BID) | OROMUCOSAL | Status: DC
  Administered 2018-09-14 – 2018-09-16 (×3): 15 mL via OROMUCOSAL

## 2018-09-14 NOTE — Progress Notes (Signed)
Patient leaves for Renal US via transport staff.

## 2018-09-14 NOTE — Progress Notes (Signed)
Pt wearing nasal cannula- declined use of CPAP

## 2018-09-14 NOTE — Progress Notes (Signed)
PROGRESS NOTE  [place] JON  TXM:[id number] DOB: November 25, 1945 DOA: 09/13/2018 PCP: Merrilee Seashore, MD  Outpatient Specialists: Nephrology, Dr. Florene Glen Brief Narrative: AZELIE NOGUERA is a 73 y.o. female with history of chronic kidney disease stage IV, sleep apnea, interstitial lung disease, CAD, A. fib, hypothyroidism, hypertension, diastolic CHF had followed up with pulmonologist and cardiologist.  At that time patient did complain of increasing shortness of breath which has been progressing over the last few weeks with orthopnea and lower extremity edema. She was found to have progressive worsening of renal failure (creatinine >8 from 4) on labs and was directed to the ED. The patient was felt to have volume overload due to renal failure though has insistently declined consideration of hemodialysis. IV lasix was started and she was admitted 1/10.   Assessment & Plan: Principal Problem:   ARF (acute renal failure) (HCC) Active Problems:   Obstructive sleep apnea   CKD (chronic kidney disease) stage 5, GFR less than 15 ml/min (HCC)   DM (diabetes mellitus), type 2 with renal complications (HCC)   Hypertension   Hyperlipidemia   Atrial fibrillation ([place])   Pulmonary fibrosis ([place])  AKI on stage IV CKD: Possibly progression of CKD. Declining dialysis. Renal U/S without obstruction, and oddly without cortical thinning/atrophy/increased echogenicity. - Continue lasix 120mg  IVPB q12h, this is an increase from home dosing. Give usual Saturday dose of metolazone. Pt reports good urine output, wt stable - Monitor I/O, weights, BMP. - NAGMA is mild, no indication for bicarbonate at this time - Consult palliative care for goals of care.  Acute on chronic diastolic CHF:  - Diuresis as above - Check echocardiogram.  Chronic hypoxic respiratory failure due to ILD due to recurrent aspiration:  - Followed by Dr. Halford Chessman who placed referral to palliative care yesterday prior to arrival.  - Continue  nebulized therapies - Continue supplemental oxygen, 4L pulsed.  T2DM:  - Continue SSI, lantus was decreased by half due to renal impairment.   Chronic atrial fibrillation and tachybradycardia syndrome s/p PPM:  - Continue amiodarone, metoprolol, diltiazem   - Continue eliquis  Hyperlipidemia:  - Continue statin  OSA:  - CPAP qHS  Hypothyroidism: TSH 2.797  - Continue synthroid  CAD denies any chest pain.  Morbid obesity: BMI 42.  - Weight loss recommended  DVT prophylaxis: Eliquis Code Status: DNR confirmed during [place] discussions today. Family Communication: None at bedside Disposition Plan: Uncertain, likely home pending trial of IV diuresis  Consultants:   Palliative medicine  Procedures:   None  Antimicrobials:  None   Subjective: Shortness of breath is no better, started having increased urination this morning. Still +orthopnea and leg swelling. No chest pain.   Objective: Vitals:   09/14/18 0538 09/14/18 0540 09/14/18 0947 09/14/18 1111  BP: 128/67  132/66 (!) 145/81  Pulse: 69  69 70  Resp: 20   18  Temp: 98.6 F (37 C)   98.6 F (37 C)  TempSrc: Oral   Oral  SpO2: 91%   94%  Weight:  111.6 kg    Height:        Intake/Output Summary (Last 24 hours) at 09/14/2018 1626 Last data filed at 09/14/2018 1404 Gross per 24 hour  Intake 770 ml  Output 1150 ml  Net -380 ml   Filed Weights   09/13/18 2117 09/14/18 0540  Weight: 111.9 kg 111.6 kg    Gen: 73 y.o. female in no distress Pulm: Non-labored tachypnea with supplemental oxygen. Diminished with bilateral crackles.  CV: Irreg, rate in 70's. No murmur, rub, or gallop. No JVD, + pedal edema. GI: Abdomen soft, non-tender, non-distended, with normoactive bowel sounds. No organomegaly or masses felt. Ext: Warm, no deformities Skin: No rashes, lesions or ulcers Neuro: Alert and oriented. No focal neurological deficits. Psych: Judgement and insight appear normal. Mood & affect appropriate.   Data  Reviewed: I have personally reviewed following labs and imaging studies  CBC: Recent Labs  Lab 09/13/18 1038 09/14/18 0358  WBC 6.1 6.3  HGB 13.2 11.8*  HCT 42.3 36.3  MCV 101.4* 99.2  PLT 187 546   Basic Metabolic Panel: Recent Labs  Lab 09/13/18 1037 09/14/18 0358  NA 140 139  K 4.7 4.1  CL 109 109  CO2 19* 19*  GLUCOSE 174* 182*  BUN 83* 84*  CREATININE 8.92* 9.06*  CALCIUM 9.4 8.9   GFR: Estimated Creatinine Clearance: 6.9 mL/min (A) (by C-G formula based on SCr of 9.06 mg/dL (H)). Liver Function Tests: Recent Labs  Lab 09/13/18 1037  AST 16  ALT 16  ALKPHOS 52  BILITOT 0.6  PROT 7.4  ALBUMIN 4.0   No results for input(s): LIPASE, AMYLASE in the last 168 hours. No results for input(s): AMMONIA in the last 168 hours. Coagulation Profile: No results for input(s): INR, PROTIME in the last 168 hours. Cardiac Enzymes: No results for input(s): CKTOTAL, CKMB, CKMBINDEX, TROPONINI in the last 168 hours. BNP (last 3 results) No results for input(s): PROBNP in the last 8760 hours. HbA1C: No results for input(s): HGBA1C in the last 72 hours. CBG: Recent Labs  Lab 09/13/18 2120 09/14/18 0733 09/14/18 1117  GLUCAP 189* 178* 207*   Lipid Profile: Recent Labs    09/13/18 1037  CHOL 185  HDL 49  LDLCALC 90  TRIG 232*  CHOLHDL 3.8   Thyroid Function Tests: Recent Labs    09/13/18 1039  TSH 2.797   Anemia Panel: No results for input(s): VITAMINB12, FOLATE, FERRITIN, TIBC, IRON, RETICCTPCT in the last 72 hours. Urine analysis:    Component Value Date/Time   COLORURINE YELLOW 12/09/2013 1312   APPEARANCEUR CLEAR 12/09/2013 1312   LABSPEC 1.017 12/09/2013 1312   PHURINE 6.0 12/09/2013 1312   GLUCOSEU NEGATIVE 12/09/2013 1312   HGBUR NEGATIVE 12/09/2013 1312   BILIRUBINUR NEGATIVE 12/09/2013 1312   KETONESUR NEGATIVE 12/09/2013 1312   PROTEINUR NEGATIVE 12/09/2013 1312   UROBILINOGEN 1.0 12/09/2013 1312   NITRITE NEGATIVE 12/09/2013 1312    LEUKOCYTESUR SMALL (A) 12/09/2013 1312   No results found for this or any previous visit (from the past 240 hour(s)).    Radiology Studies: US Renal  Result Date: 09/14/2018 CLINICAL DATA:  Patient with acute renal failure. EXAM: RENAL / URINARY TRACT ULTRASOUND COMPLETE COMPARISON:  CT abdomen pelvis 02/17/2015 FINDINGS: Right Kidney: Renal measurements: 8.0 x 4.9 x 4.7 cm = volume: 94.5 mL. Normal renal cortical thickness and echogenicity. No hydronephrosis. Multiple 1 cm or smaller hypoechoic lesions within the right kidney, incompletely characterized. Left Kidney: Renal measurements: 9.8 x 5.1 x 4.9 cm = volume: 127.1 mL. Normal renal cortical thickness and echogenicity. No hydronephrosis. Hypoechoic lesion measuring up to 1 cm, incompletely characterized. Bladder: Appears normal for degree of bladder distention. IMPRESSION: No hydronephrosis. Electronically Signed   By: Lovey Newcomer M.D.   On: 09/14/2018 10:13   Dg Chest Port 1 View  Result Date: 09/13/2018 CLINICAL DATA:  Elevated creatinine. EXAM: PORTABLE CHEST 1 VIEW COMPARISON:  Chest x-ray dated December 16, 2015. FINDINGS: Unchanged left chest wall  pacemaker. Stable cardiomegaly. Normal pulmonary vascularity. No focal consolidation, pleural effusion, or pneumothorax. No acute osseous abnormality. IMPRESSION: No active disease. Electronically Signed   By: Titus Dubin M.D.   On: 09/13/2018 21:03    Scheduled Meds: . amiodarone  200 mg Oral Daily  . amitriptyline  25 mg Oral QHS  . apixaban  5 mg Oral BID  . atorvastatin  40 mg Oral QHS  . diltiazem  180 mg Oral Daily  . febuxostat  80 mg Oral Daily  . hydrALAZINE  25 mg Oral TID  . insulin aspart  0-9 Units Subcutaneous TID WC  . insulin glargine  30 Units Subcutaneous QHS  . isosorbide mononitrate  60 mg Oral Daily  . levothyroxine  25 mcg Oral Q0600  . linaclotide  290 mcg Oral QAC breakfast  . mouth rinse  15 mL Mouth Rinse BID  . metolazone  2.5 mg Oral Once per day on Tue  Sat  . metoprolol tartrate  75 mg Oral BID  . pantoprazole  40 mg Oral Daily  . zolpidem  5 mg Oral QHS   Continuous Infusions: . furosemide Stopped (09/14/18 0640)     LOS: 1 day   Time spent: 35 minutes.  Patrecia Pour, MD [facility] [email or web address] Password [facility] 09/14/2018, 4:26 PM

## 2018-09-14 NOTE — Plan of Care (Signed)
  Problem: Education: Goal: Knowledge of General Education information will improve Description Including pain rating scale, medication(s)/side effects and non-pharmacologic comfort measures Outcome: Progressing   Problem: Health Behavior/Discharge Planning: Goal: Ability to manage health-related needs will improve Outcome: Progressing   

## 2018-09-14 NOTE — Progress Notes (Signed)
Patient resting comfortably during shift report. Denies complaints.  

## 2018-09-15 DIAGNOSIS — N186 End stage renal disease: Secondary | ICD-10-CM

## 2018-09-15 DIAGNOSIS — Z515 Encounter for palliative care: Secondary | ICD-10-CM

## 2018-09-15 DIAGNOSIS — N179 Acute kidney failure, unspecified: Principal | ICD-10-CM

## 2018-09-15 DIAGNOSIS — Z7189 Other specified counseling: Secondary | ICD-10-CM

## 2018-09-15 DIAGNOSIS — Z992 Dependence on renal dialysis: Secondary | ICD-10-CM

## 2018-09-15 LAB — BASIC METABOLIC PANEL
Anion gap: 13 (ref 5–15)
BUN: 84 mg/dL — ABNORMAL HIGH (ref 8–23)
CO2: 22 mmol/L (ref 22–32)
Calcium: 9 mg/dL (ref 8.9–10.3)
Chloride: 105 mmol/L (ref 98–111)
Creatinine, Ser: 9 mg/dL — ABNORMAL HIGH (ref 0.44–1.00)
GFR calc Af Amer: 5 mL/min — ABNORMAL LOW (ref >60)
GFR calc non Af Amer: 4 mL/min — ABNORMAL LOW (ref >60)
Glucose, Bld: 185 mg/dL — ABNORMAL HIGH (ref 70–99)
Potassium: 3.4 mmol/L — ABNORMAL LOW (ref 3.5–5.1)
Sodium: 140 mmol/L (ref 135–145)

## 2018-09-15 LAB — GLUCOSE, CAPILLARY
Glucose-Capillary: 209 mg/dL — ABNORMAL HIGH (ref 70–99)
Glucose-Capillary: 230 mg/dL — ABNORMAL HIGH (ref 70–99)
Glucose-Capillary: 172 mg/dL — ABNORMAL HIGH (ref 70–99)
Glucose-Capillary: 285 mg/dL — ABNORMAL HIGH (ref 70–99)

## 2018-09-15 LAB — BRAIN NATRIURETIC PEPTIDE: B Natriuretic Peptide: 149.7 pg/mL — ABNORMAL HIGH (ref 0.0–100.0)

## 2018-09-15 MED ORDER — METOLAZONE 2.5 MG PO TABS
2.5000 mg | ORAL_TABLET | Freq: Once | ORAL | Status: AC
  Administered 2018-09-15: 2.5 mg via ORAL
  Filled 2018-09-15: qty 1

## 2018-09-15 MED ORDER — OXYCODONE HCL 5 MG PO TABS: 2.5000 mg | ORAL_TABLET | ORAL | Status: DC | PRN

## 2018-09-15 MED ORDER — FUROSEMIDE 10 MG/ML IJ SOLN
160.0000 mg | Freq: Two times a day (BID) | INTRAVENOUS | Status: DC
  Administered 2018-09-15 – 2018-09-16 (×2): 160 mg via INTRAVENOUS
  Filled 2018-09-15 (×2): qty 16
  Filled 2018-09-15: qty 4

## 2018-09-15 NOTE — Progress Notes (Signed)
Patient resting comfortably during shift report. Denies complaints.  

## 2018-09-15 NOTE — Plan of Care (Signed)
  Problem: Activity: Goal: Risk for activity intolerance will decrease Outcome: Progressing   Problem: Elimination: Goal: Will not experience complications related to bowel motility Outcome: Progressing   Problem: Safety: Goal: Ability to remain free from injury will improve Outcome: Progressing   

## 2018-09-15 NOTE — Progress Notes (Signed)
Pt refused CPAP

## 2018-09-15 NOTE — Progress Notes (Signed)
PROGRESS NOTE  [name]  WSF:[id number] DOB: 1945-12-04 DOA: 09/13/2018 PCP: Merrilee Seashore, MD  Outpatient Specialists: Nephrology, Dr. Florene Glen Brief Narrative: [name] is a 73 y.o. female with history of chronic kidney disease stage IV, sleep apnea, interstitial lung disease, CAD, A. fib, hypothyroidism, hypertension, diastolic CHF had followed up with pulmonologist and cardiologist.  At that time patient did complain of increasing shortness of breath which has been progressing over the last few weeks with orthopnea and lower extremity edema. She was found to have progressive worsening of renal failure (creatinine >8 from 4) on labs and was directed to the ED. The patient was felt to have volume overload due to renal failure though has insistently declined consideration of hemodialysis. IV lasix was started and she was admitted 1/10. Diuresis was sluggish, augmented 1/11.  Assessment & Plan: Principal Problem:   ARF (acute renal failure) (HCC) Active Problems:   Obstructive sleep apnea   CKD (chronic kidney disease) stage 5, GFR less than 15 ml/min (HCC)   DM (diabetes mellitus), type 2 with renal complications (HCC)   Hypertension   Hyperlipidemia   Atrial fibrillation ([place])   Pulmonary fibrosis (HCC)  AKI on stage IV CKD, suspect progression of CKD: Declining dialysis. Renal U/S without obstruction, and oddly without cortical thinning/atrophy/increased echogenicity. - Increase lasix to 160mg  IVPB q12h and repeat metolazone off-schedule. - Monitor I/O, weights, BMP. Weight unchanged, remains volume up. - NAGMA is mild, no indication for bicarbonate at this time - Consulted palliative care for goals of care discussions. Planning discharge home wit hospice with plans not to return to hospital, but transfer to residential hospice once symptoms unmanageable at home. Starting oxycodone for dyspnea.  Acute on chronic diastolic CHF:  - Diuresis as above - Check  echocardiogram.  Chronic hypoxic respiratory failure due to ILD due to recurrent aspiration:  - Followed by Dr. Halford Chessman who placed referral to palliative care yesterday prior to arrival.  - Continue nebulized therapies - Continue supplemental oxygen, 4L pulsed.  T2DM:  - Continue SSI, lantus was decreased by half due to renal impairment.   Chronic atrial fibrillation and tachybradycardia syndrome s/p PPM:  - Continue amiodarone, metoprolol, diltiazem   - Continue eliquis  Hyperlipidemia:  - Continue statin  OSA:  - CPAP qHS  Hypothyroidism: TSH 2.797  - Continue synthroid  CAD denies any chest pain.  Morbid obesity: BMI 42.  - Weight loss recommended  DVT prophylaxis: Eliquis Code Status: DNR Family Communication: Brother by phone, none at bedside Disposition Plan: Home with hospice likely in the next 24 hours.  Consultants:   Palliative medicine  Procedures:   None  Antimicrobials:  None   Subjective: Still severely, constantly short of breath, limiting mobility to her immediate surroundings only, alleviated by resting, worsened when laying back. No chest pain.  Objective: Vitals:   09/15/18 0447 09/15/18 0451 09/15/18 1021 09/15/18 1155  BP: 125/74 (!) 147/84 129/76 (!) 144/75  Pulse: 70 78  70  Resp: 20 18  18   Temp: 98.2 F (36.8 C) 98.6 F (37 C)  98.7 F (37.1 C)  TempSrc: Oral Oral  Oral  SpO2: 97% 99%  96%  Weight:  111.9 kg    Height:        Intake/Output Summary (Last 24 hours) at 09/15/2018 1419 Last data filed at 09/15/2018 0900 Gross per 24 hour  Intake 1098.75 ml  Output 900 ml  Net 198.75 ml   Filed Weights   09/13/18 2117 09/14/18 0540  09/15/18 0451  Weight: 111.9 kg 111.6 kg 111.9 kg   Gen: Pleasant, obese female in no distress Pulm: Nonlabored tachypneic on supplemental oxygen, crackles remain at bases. CV: Irreg, no murmur, rub, or gallop. Unable to determine, but no definite JVD, 1+ dependent edema. GI: Abdomen soft,  non-tender, non-distended, with normoactive bowel sounds.  Ext: Warm, no deformities Skin: No rashes, lesions or ulcers on visualized skin. Neuro: Alert and oriented. No focal neurological deficits. Psych: Judgement and insight appear fair. Mood euthymic & affect congruent. Behavior is appropriate.    Data Reviewed: I have personally reviewed following labs and imaging studies  CBC: Recent Labs  Lab 09/13/18 1038 09/14/18 0358  WBC 6.1 6.3  HGB 13.2 11.8*  HCT 42.3 36.3  MCV 101.4* 99.2  PLT 187 443   Basic Metabolic Panel: Recent Labs  Lab 09/13/18 1037 09/14/18 0358 09/15/18 0441  NA 140 139 140  K 4.7 4.1 3.4*  CL 109 109 105  CO2 19* 19* 22  GLUCOSE 174* 182* 185*  BUN 83* 84* 84*  CREATININE 8.92* 9.06* 9.00*  CALCIUM 9.4 8.9 9.0   GFR: Estimated Creatinine Clearance: 6.9 mL/min (A) (by C-G formula based on SCr of 9 mg/dL (H)). Liver Function Tests: Recent Labs  Lab 09/13/18 1037  AST 16  ALT 16  ALKPHOS 52  BILITOT 0.6  PROT 7.4  ALBUMIN 4.0   No results for input(s): LIPASE, AMYLASE in the last 168 hours. No results for input(s): AMMONIA in the last 168 hours. Coagulation Profile: No results for input(s): INR, PROTIME in the last 168 hours. Cardiac Enzymes: No results for input(s): CKTOTAL, CKMB, CKMBINDEX, TROPONINI in the last 168 hours. BNP (last 3 results) No results for input(s): PROBNP in the last 8760 hours. HbA1C: No results for input(s): HGBA1C in the last 72 hours. CBG: Recent Labs  Lab 09/14/18 1117 09/14/18 1635 09/14/18 2104 09/15/18 0735 09/15/18 1152  GLUCAP 207* 176* 198* 209* 230*   Lipid Profile: Recent Labs    09/13/18 1037  CHOL 185  HDL 49  LDLCALC 90  TRIG 232*  CHOLHDL 3.8   Thyroid Function Tests: Recent Labs    09/13/18 1039  TSH 2.797   Anemia Panel: No results for input(s): VITAMINB12, FOLATE, FERRITIN, TIBC, IRON, RETICCTPCT in the last 72 hours. Urine analysis:    Component Value Date/Time    COLORURINE YELLOW 12/09/2013 1312   APPEARANCEUR CLEAR 12/09/2013 1312   LABSPEC 1.017 12/09/2013 1312   PHURINE 6.0 12/09/2013 1312   GLUCOSEU NEGATIVE 12/09/2013 1312   HGBUR NEGATIVE 12/09/2013 1312   BILIRUBINUR NEGATIVE 12/09/2013 1312   KETONESUR NEGATIVE 12/09/2013 1312   PROTEINUR NEGATIVE 12/09/2013 1312   UROBILINOGEN 1.0 12/09/2013 1312   NITRITE NEGATIVE 12/09/2013 1312   LEUKOCYTESUR SMALL (A) 12/09/2013 1312   No results found for this or any previous visit (from the past 240 hour(s)).    Radiology Studies: US Renal  Result Date: 09/14/2018 CLINICAL DATA:  Patient with acute renal failure. EXAM: RENAL / URINARY TRACT ULTRASOUND COMPLETE COMPARISON:  CT abdomen pelvis 02/17/2015 FINDINGS: Right Kidney: Renal measurements: 8.0 x 4.9 x 4.7 cm = volume: 94.5 mL. Normal renal cortical thickness and echogenicity. No hydronephrosis. Multiple 1 cm or smaller hypoechoic lesions within the right kidney, incompletely characterized. Left Kidney: Renal measurements: 9.8 x 5.1 x 4.9 cm = volume: 127.1 mL. Normal renal cortical thickness and echogenicity. No hydronephrosis. Hypoechoic lesion measuring up to 1 cm, incompletely characterized. Bladder: Appears normal for degree of bladder distention.  IMPRESSION: No hydronephrosis. Electronically Signed   By: Lovey Newcomer M.D.   On: 09/14/2018 10:13   Dg Chest Port 1 View  Result Date: 09/13/2018 CLINICAL DATA:  Elevated creatinine. EXAM: PORTABLE CHEST 1 VIEW COMPARISON:  Chest x-ray dated December 16, 2015. FINDINGS: Unchanged left chest wall pacemaker. Stable cardiomegaly. Normal pulmonary vascularity. No focal consolidation, pleural effusion, or pneumothorax. No acute osseous abnormality. IMPRESSION: No active disease. Electronically Signed   By: Titus Dubin M.D.   On: 09/13/2018 21:03    Scheduled Meds: . amiodarone  200 mg Oral Daily  . amitriptyline  25 mg Oral QHS  . apixaban  5 mg Oral BID  . atorvastatin  40 mg Oral QHS  .  diltiazem  180 mg Oral Daily  . febuxostat  80 mg Oral Daily  . hydrALAZINE  25 mg Oral TID  . insulin aspart  0-9 Units Subcutaneous TID WC  . insulin glargine  30 Units Subcutaneous QHS  . ipratropium  2 spray Each Nare BID  . isosorbide mononitrate  60 mg Oral Daily  . levothyroxine  25 mcg Oral Q0600  . linaclotide  290 mcg Oral QAC breakfast  . mouth rinse  15 mL Mouth Rinse BID  . metolazone  2.5 mg Oral Once per day on Tue Sat  . metoprolol tartrate  75 mg Oral BID  . pantoprazole  40 mg Oral Daily  . zolpidem  5 mg Oral QHS   Continuous Infusions: . sodium chloride Stopped (09/15/18 0700)  . furosemide       LOS: 2 days   Time spent: 35 minutes.  Patrecia Pour, MD [facility] [email or web address] Password [facility] 09/15/2018, 2:19 PM

## 2018-09-15 NOTE — Consult Note (Signed)
Consultation Note Date: 09/15/2018   Patient Name: [name]  DOB: Jan 27, 1946  MRN: [id number]  Age / Sex: 73 y.o., female  PCP: Merrilee Seashore, MD Referring Physician: Patrecia Pour, MD  Reason for Consultation: Establishing goals of care, Hospice Evaluation and Non pain symptom management  HPI/Patient Profile: 73 y.o. female  with past medical history of chronic kidney disease stage IV, obstructive sleep apnea, interstitial lung disease, coronary artery disease, diastolic heart failure, pacemaker, atrial fib, hypertension, diabetes, hypothyroidism admitted on 09/13/2018 with recent shortness of breath as well as increased lower extremity edema.  Patient states she had been to her doctor's office where blood was drawn and was found to have a markedly increased creatinine from her baseline of 4.  Her creatinine as of 09/15/2018 is 9, GFR 5  Consult ordered for goals of care.   Clinical Assessment and Goals of Care: Patient seen, chart reviewed.  Patient is alert and able to speak for herself.  She was diagnosed with kidney failure years ago and has always stated that she would not want to have hemodialysis and has prepared her children that she would not pursue this when her disease worsens..   I spoke to [name] and her daughter [name] for goals of care discussion.  Patient is a DNR and continues to verbalize no hemodialysis.  She is amazingly alert but does report feeling very fatigued.  She is short of breath with any movement [name] states that she and her siblings have struggled with their mother not wanting to do hemodialysis but are going to respect her wishes.  [name] does verbalize that at this point she understands that hemodialysis would offer more risk than benefit.  She states she and her siblings are all in agreement.  Patient at this point can speak for herself.  She has 3 children, who would  be her healthcare proxy's were she unable to speak for herself.  [name] [phone] [name] [phone], [name] [phone].     SUMMARY OF RECOMMENDATIONS   DNR DNI No hemodialysis Home with hospice.  Consult placed to care management Code Status/Advance Care Planning:  DNR    Symptom Management:   Dyspnea: Discussed opioids in terms of management of not only pain but shortness of breath.  As patient is in renal failure, morphine would not be metabolized.  We will start low-dose oxycodone 2.5 to 5 mg every 4 hours as needed for shortness of breath.  Patient and daughter in agreement  Palliative Prophylaxis:   Aspiration, Bowel Regimen, Delirium Protocol, Eye Care, Frequent Pain Assessment, Oral Care and Turn Reposition  Additional Recommendations (Limitations, Scope, Preferences):  Avoid Hospitalization, Minimize Medications, Initiate Comfort Feeding, No Artificial Feeding, No Blood Transfusions, No Chemotherapy, No Hemodialysis, No Surgical Procedures and No Tracheostomy  Psycho-social/Spiritual:   Desire for further Chaplaincy support:no  Additional Recommendations: Referral to Community Resources   Prognosis:   < 6 weeks in the setting of end-stage renal disease; no hemodialysis.  Current creatinine 9, GFR 5 with comorbidities  of severe interstitial lung disease, diastolic heart failure, atrial fib  Discharge Planning: Home with Hospice      Primary Diagnoses: Present on Admission: . ARF (acute renal failure) ([place]) . Pulmonary fibrosis ([place]) . Obstructive sleep apnea . Hypertension . Hyperlipidemia . DM (diabetes mellitus), type 2 with renal complications ([place]) . CKD (chronic kidney disease) stage 5, GFR less than 15 ml/min (H[facility]) . Atrial fibrillation ([place])   I have reviewed the medical record, interviewed the patient and family, and examined the patient. The following aspects are pertinent.  Past Medical History:  Diagnosis Date  .  Anginal pain ([place])    occ; non-ischemic Lexiscan 09/2012  . Arthritis   . Asthma   . Atrial flutter ([place])    ablated by Dr Lovena Le in 2008  . CKD (chronic kidney disease) 04/2007   CKD stage 4(Dr. Erling Cruz)  . Complication of anesthesia     DIFFICULTY BREATHING   . Coronary atherosclerosis of native coronary artery   . Diastolic heart failure 04/9380   grade 2 diastolic dysfunction per 0/1751 echo  . DNI (do not intubate)   . DNR (do not resuscitate)   . Esophageal dysmotility 2008   noted on esophagram.  hx dysphagia.   . Fatty liver 2008   noted on ultrasound 2008  . Fatty tumor fatty tumor back  . GERD (gastroesophageal reflux disease) 2012   Barrets esophagus on bx 2012 and 2014.   Marland Kitchen Gouty arthropathy   . Heart murmur   . Hyperlipidemia   . Hypertension   . IDDM (insulin dependent diabetes mellitus) ([name])    type 2.   . Morbid obesity ([place])   . Myocardial infarction ([place]) 2009  . Peripheral vascular disease ([place])   . Persistent atrial fibrillation    chads2 vasc score of at least 5  . Sick sinus syndrome ([place])   . Sleep apnea    wears CPAP   Social History   Socioeconomic History  . Marital status: Widowed    Spouse name: Not on file  . Number of children: 3  . Years of education: Not on file  . Highest education level: Not on file  Occupational History  . Occupation: Disabled  Social Needs  . Financial resource strain: Not on file  . Food insecurity:    Worry: Sometimes true    Inability: Sometimes true  . Transportation needs:    Medical: Not on file    Non-medical: Not on file  Tobacco Use  . Smoking status: Former Smoker    Packs/day: 0.50    Years: 35.00    Pack years: 17.50    Types: Cigarettes    Last attempt to quit: 02/16/1995    Years since quitting: 23.5  . Smokeless tobacco: Never Used  . Tobacco comment: 05/2014  QUIT OVER 20 YEARS AGO "  Substance and Sexual Activity  . Alcohol use: No    Alcohol/week: 0.0 standard drinks  . Drug  use: No  . Sexual activity: Not Currently  Lifestyle  . Physical activity:    Days per week: Not on file    Minutes per session: Not on file  . Stress: Not on file  Relationships  . Social connections:    Talks on phone: Not on file    Gets together: Not on file    Attends religious service: Not on file    Active member of club or organization: Not on file    Attends meetings of clubs or organizations:  Not on file    Relationship status: Not on file  Other Topics Concern  . Not on file  Social History Narrative  . Not on file   Family History  Problem Relation Age of Onset  . Heart disease Mother   . Bladder Cancer Mother   . Kidney disease Mother   . Ovarian cancer Daughter   . Stomach cancer Maternal Uncle   . Colon cancer Maternal Aunt        dx in her 32's  . Esophageal cancer Neg Hx    Scheduled Meds: . amiodarone  200 mg Oral Daily  . amitriptyline  25 mg Oral QHS  . apixaban  5 mg Oral BID  . atorvastatin  40 mg Oral QHS  . diltiazem  180 mg Oral Daily  . febuxostat  80 mg Oral Daily  . hydrALAZINE  25 mg Oral TID  . insulin aspart  0-9 Units Subcutaneous TID WC  . insulin glargine  30 Units Subcutaneous QHS  . ipratropium  2 spray Each Nare BID  . isosorbide mononitrate  60 mg Oral Daily  . levothyroxine  25 mcg Oral Q0600  . linaclotide  290 mcg Oral QAC breakfast  . mouth rinse  15 mL Mouth Rinse BID  . metolazone  2.5 mg Oral Once per day on Tue Sat  . metoprolol tartrate  75 mg Oral BID  . pantoprazole  40 mg Oral Daily  . zolpidem  5 mg Oral QHS   Continuous Infusions: . sodium chloride Stopped (09/15/18 0700)  . furosemide     PRN Meds:.sodium chloride, acetaminophen **OR** acetaminophen, albuterol, nitroGLYCERIN, ondansetron **OR** ondansetron (ZOFRAN) IV Medications Prior to Admission:  Prior to Admission medications   Medication Sig Start Date End Date Taking? Authorizing Provider  acetaminophen (TYLENOL) 500 MG tablet Take 500-1,000 mg by  mouth every 6 (six) hours as needed for mild pain or headache.   Yes [provider]  albuterol (PROVENTIL) (2.5 MG/3ML) 0.083% nebulizer solution Take 3 mLs (2.5 mg total) by nebulization every 6 (six) hours as needed for shortness of breath. 08/08/17  Yes Larey Dresser, MD  amiodarone (PACERONE) 200 MG tablet Take 1 tablet (200 mg total) by mouth daily. 08/20/18  Yes Larey Dresser, MD  amitriptyline (ELAVIL) 25 MG tablet Take 25 mg by mouth at bedtime.     Yes [provider]  atorvastatin (LIPITOR) 40 MG tablet TAKE 1 TABLET BY MOUTH ONCE DAILY Patient taking differently: Take 40 mg by mouth at bedtime.  07/08/18  Yes Larey Dresser, MD  CARTIA XT 180 MG 24 hr capsule TAKE 1 CAPSULE BY MOUTH ONCE DAILY Patient taking differently: Take 180 mg by mouth daily.  08/20/18  Yes Larey Dresser, MD  dicyclomine (BENTYL) 20 MG tablet Take 20 mg by mouth daily at 12 noon.   Yes [provider]  ELIQUIS 5 MG TABS tablet TAKE 1 TABLET BY MOUTH TWICE DAILY Patient taking differently: Take 5 mg by mouth 2 (two) times daily.  07/08/18  Yes Larey Dresser, MD  esomeprazole (NEXIUM) 40 MG capsule TAKE 1 CAPSULE BY MOUTH TWICE DAILY BEFORE MEAL(S) Patient taking differently: Take 40 mg by mouth daily before breakfast.  03/19/17  Yes Armbruster, Carlota Raspberry, MD  hydrALAZINE (APRESOLINE) 25 MG tablet TAKE 1 TABLET BY MOUTH THREE TIMES DAILY Patient taking differently: Take 25 mg by mouth 3 (three) times daily.  03/18/18  Yes Larey Dresser, MD  insulin glargine (LANTUS) 100  UNIT/ML injection Inject 60 Units into the skin daily after breakfast.    Yes [provider]  levothyroxine (SYNTHROID, LEVOTHROID) 25 MCG tablet Take 25 mcg by mouth daily before lunch.  04/26/15  Yes [provider]  LINZESS 290 MCG CAPS capsule Take 290 mcg by mouth daily before breakfast.  06/02/16  Yes [provider]  metolazone (ZAROXOLYN) 2.5 MG tablet Take 1 tablet (2.5 mg  total) by mouth 2 (two) times a week. Tuesday and Friday Patient taking differently: Take 2.5 mg by mouth 2 (two) times a week. TUESDAYS and SATURDAYS 10/01/17  Yes Larey Dresser, MD  metoprolol tartrate (LOPRESSOR) 25 MG tablet TAKE 3 TABLETS BY MOUTH TWICE DAILY Patient taking differently: Take 75 mg by mouth See admin instructions. Take 75 mg by mouth two times a day- 5:30 PM and 9:30 PM 03/21/18  Yes Bensimhon, Shaune Pascal, MD  nitroGLYCERIN (NITROSTAT) 0.4 MG SL tablet Place 0.4 mg under the tongue every 5 (five) minutes x 3 doses as needed for chest pain.    Yes [provider]  NOVOLOG FLEXPEN 100 UNIT/ML FlexPen Inject 40 Units into the skin 3 (three) times daily as needed (for a BGL of 200 or greater).  07/23/16  Yes [provider]  OXYGEN Inhale 4 L into the lungs continuous.    Yes [provider]  potassium chloride SA (K-DUR,KLOR-CON) 20 MEQ tablet Take 40 mEq by mouth 4 (four) times daily.    Yes [provider]  torsemide (DEMADEX) 100 MG tablet Take 100 mg by mouth 2 (two) times daily.   Yes [provider]  ULORIC 80 MG TABS Take 80 mg by mouth daily.  01/02/16  Yes [provider]  Vitamin D, Ergocalciferol, (DRISDOL) 50000 UNITS CAPS capsule Take 50,000 Units by mouth every Monday.   Yes [provider]  XOPENEX HFA 45 MCG/ACT inhaler Inhale 2 puffs into the lungs every 4 (four) hours as needed for wheezing or shortness of breath. 03/11/14  Yes Whiteheart, Cristal Ford, NP  zolpidem (AMBIEN) 10 MG tablet Take 10 mg by mouth at bedtime.    Yes [provider]  isosorbide mononitrate (IMDUR) 60 MG 24 hr tablet Take 1 tablet (60 mg total) by mouth daily. 09/13/18 12/12/18  Georgiana Shore, NP  RELION PEN NEEDLES 32G X 4 MM MISC  07/19/16   [provider]   Allergies  Allergen Reactions  . Imdur [Isosorbide Dinitrate] Anaphylaxis and Other (See Comments)    Headache, also   . Adhesive [Tape] Itching    EKG  leads  . Sulfa Antibiotics Itching and Nausea And Vomiting    "Everything I saw was red"  . Gabapentin Other (See Comments)    Caused tremors, legs buckled, and couldn't hold onto anything   . Latex Hives  . Lyrica [Pregabalin] Other (See Comments)    Caused tremors, legs buckled, and couldn't hold onto anything   . Codeine Nausea And Vomiting  . Penicillins Nausea And Vomiting    DID THE REACTION INVOLVE: Swelling of the face/tongue/throat, SOB, or low BP? No Sudden or severe rash/hives, skin peeling, or the inside of the mouth or nose? Unk Did it require medical treatment? Unk When did it last happen?Childhood If all above answers are "NO", may proceed with cephalosporin use.    Review of Systems  Physical Exam Vitals signs and nursing note reviewed.  Constitutional:      Appearance: She is ill-appearing.     Comments: Well-nourished  elderly female   HENT:     Head: Normocephalic and atraumatic.  Pulmonary:     Comments: Mild increased work of breathing at rest Very short of breath with minimal exertion Abdominal:     General: There is distension.  Musculoskeletal:        General: Swelling present.     Right lower leg: Edema present.     Left lower leg: Edema present.  Neurological:     General: No focal deficit present.     Mental Status: She is alert and oriented to person, place, and time.  Psychiatric:        Mood and Affect: Mood normal.        Behavior: Behavior normal.     Vital Signs: BP (!) 144/75 (BP Location: Left Arm)   Pulse 70   Temp 98.7 F (37.1 C) (Oral)   Resp 18   Ht 5\' 4"  (1.626 m)   Wt 111.9 kg   SpO2 96%   BMI 42.33 kg/m  Pain Scale: 0-10   Pain Score: 0-No pain   SpO2: SpO2: 96 % O2 Device:SpO2: 96 % O2 Flow Rate: .O2 Flow Rate (L/min): 3 L/min  IO: Intake/output summary:   Intake/Output Summary (Last 24 hours) at 09/15/2018 1251 Last data filed at 09/15/2018 0900 Gross per 24 hour  Intake 1338.75 ml  Output 900 ml  Net  438.75 ml    LBM: Last BM Date: 09/15/18 Baseline Weight: Weight: 111.9 kg Most recent weight: Weight: 111.9 kg     Palliative Assessment/Data:   Flowsheet Rows     Most Recent Value  Intake Tab  Referral Department  Hospitalist  Unit at Time of Referral  Med/Surg Unit  Palliative Care Primary Diagnosis  Nephrology  Date Notified  09/14/18  Palliative Care Type  New Palliative care  Reason for referral  Clarify Goals of Care, Non-pain Symptom, Psychosocial or Spiritual support  Date of Admission  09/15/18  # of days IP prior to Palliative referral  -1  Clinical Assessment  Palliative Performance Scale Score  40%  Pain Max last 24 hours  Not able to report  Pain Min Last 24 hours  Not able to report  Dyspnea Max Last 24 Hours  Not able to report  Dyspnea Min Last 24 hours  Not able to report  Nausea Max Last 24 Hours  Not able to report  Nausea Min Last 24 Hours  Not able to report  Anxiety Max Last 24 Hours  Not able to report  Anxiety Min Last 24 Hours  Not able to report  Other Max Last 24 Hours  Not able to report  Psychosocial & Spiritual Assessment  Palliative Care Outcomes  Patient/Family meeting held?  Yes  Who was at the meeting?  dtr, pt  Palliative Care Outcomes  Provided psychosocial or spiritual support, Transitioned to hospice  Patient/Family wishes: Interventions discontinued/not started   Mechanical Ventilation, BiPAP, Hemodialysis  Palliative Care follow-up planned  Yes, Facility      Time In:1100 Time Out: 1215 Time Total: 75 min Greater than 50%  of this time was spent counseling and coordinating care related to the above assessment and plan. Staff with Dr. Bonner Puna  Signed by: Dory Horn, NP   Please contact Palliative Medicine Team phone at [phone] for questions and concerns.  For individual provider: See Shea Evans

## 2018-09-15 NOTE — Progress Notes (Signed)
Palliative at bedside with patient.

## 2018-09-16 ENCOUNTER — Ambulatory Visit (HOSPITAL_COMMUNITY): Payer: PPO

## 2018-09-16 ENCOUNTER — Ambulatory Visit (INDEPENDENT_AMBULATORY_CARE_PROVIDER_SITE_OTHER): Payer: Medicare Other

## 2018-09-16 ENCOUNTER — Other Ambulatory Visit: Payer: Self-pay

## 2018-09-16 DIAGNOSIS — I495 Sick sinus syndrome: Secondary | ICD-10-CM | POA: Diagnosis not present

## 2018-09-16 LAB — BASIC METABOLIC PANEL
Anion gap: 16 — ABNORMAL HIGH (ref 5–15)
BUN: 86 mg/dL — ABNORMAL HIGH (ref 8–23)
CO2: 24 mmol/L (ref 22–32)
Calcium: 8.7 mg/dL — ABNORMAL LOW (ref 8.9–10.3)
Chloride: 99 mmol/L (ref 98–111)
Creatinine, Ser: 9.18 mg/dL — ABNORMAL HIGH (ref 0.44–1.00)
GFR calc Af Amer: 4 mL/min — ABNORMAL LOW (ref >60)
GFR calc non Af Amer: 4 mL/min — ABNORMAL LOW (ref >60)
Glucose, Bld: 206 mg/dL — ABNORMAL HIGH (ref 70–99)
Potassium: 2.8 mmol/L — ABNORMAL LOW (ref 3.5–5.1)
Sodium: 139 mmol/L (ref 135–145)

## 2018-09-16 LAB — GLUCOSE, CAPILLARY
Glucose-Capillary: 193 mg/dL — ABNORMAL HIGH (ref 70–99)
Glucose-Capillary: 242 mg/dL — ABNORMAL HIGH (ref 70–99)

## 2018-09-16 MED ORDER — POTASSIUM CHLORIDE CRYS ER 20 MEQ PO TBCR: 20.0000 meq | EXTENDED_RELEASE_TABLET | Freq: Once | ORAL | Status: DC

## 2018-09-16 MED ORDER — POTASSIUM CHLORIDE CRYS ER 20 MEQ PO TBCR
40.0000 meq | EXTENDED_RELEASE_TABLET | Freq: Two times a day (BID) | ORAL | Status: DC
  Administered 2018-09-16: 40 meq via ORAL
  Filled 2018-09-16: qty 2

## 2018-09-16 MED ORDER — OXYCODONE HCL 5 MG PO TABS: 2.5000 mg | ORAL_TABLET | ORAL | 0 refills | Status: DC | PRN

## 2018-09-16 MED ORDER — POTASSIUM CHLORIDE CRYS ER 20 MEQ PO TBCR: 40.0000 meq | EXTENDED_RELEASE_TABLET | Freq: Two times a day (BID) | ORAL | Status: DC

## 2018-09-16 NOTE — Progress Notes (Signed)
Patient will be discharged home via ambulance; home address verified by patient; PTAR called; Aneta Mins [phone]

## 2018-09-16 NOTE — Care Management Note (Signed)
Case Management Note  Patient Details  Name: [name] MRN: [id number] Date of Birth: December 15, 1945  Subjective/Objective:    ARF               Action/Plan: CM talked to patient about Home Hospice choices, pt chose [facility]. Referral made as requested.  Expected Discharge Date:    possibly 09/16/2018              Expected Discharge Plan:  [place]  Discharge planning Services  CM Consult Choice offered to:  Patient [facility] Agency:  [facility]  Status of Service:  In process, will continue to follow  Sherrilyn Rist [phone] 09/16/2018, 10:45 AM

## 2018-09-16 NOTE — Patient Outreach (Signed)
  [facility]) Care Management Chronic Special Needs Program  09/16/2018  Name: FRANCELLA BARNETT DOB: 10-Jul-1946  MRN: [id number]  Ms. Michaiah Romberg is enrolled in a chronic special needs plan-Diabetes. Client noted to be admitted on 09/13/2018 with Acute Renal Failure. Client care plan faxed to utilization management department.  Plan:  Continue care coordination. RNCM will notify Hospital liaison of admission.   Thea Silversmith, RN, MSN, [facility] [phone]

## 2018-09-16 NOTE — Consult Note (Signed)
   [facility] CM Inpatient Consult   09/16/2018  [name] Jan 15, 1946 [id number]  Patient is currently active with [facility] for chronic disease management services in the [place].  Patient will be followed for disposition and needs for post hospital follow up. Made Inpatient Case Manager aware that [facility] following.  Chart review reveals patient has been seen by [facility] Of note, and patient desires no aggressive care. Per MD notes 09/14/2018: [name] is a 73 y.o. female with history of chronic kidney disease stage IV, sleep apnea, interstitial lung disease, CAD, A. fib, hypothyroidism, hypertension, diastolic CHF had followed up with pulmonologist and cardiologist.  At that time patient did complain of increasing shortness of breath which has been progressing over the last few weeks with orthopnea and lower extremity edema. She was found to have progressive worsening of renal failure (creatinine >8 from 4) on labs and was directed to the ED. The patient was felt to have volume overload due to renal failure though has insistently declined consideration of hemodialysis. Notes today reveals that the patient is to transition to hospice care.  [facility] Care Management services does not replace or interfere with any services that are needed or arranged by inpatient case management or social work.  For additional questions or referrals please contact:  Natividad Brood, RN BSN [facility] Liaison  [phone] business mobile phone Toll free office [phone]

## 2018-09-16 NOTE — Progress Notes (Signed)
°  [facility] Liaison: RN visit  Notified by Olga Coaster, [facility] of patient/family request for [facility] services at home after discharge. Chart and patient information reviewed by [facility] physician and pt is Hospice eligible.  Writer spoke with the patient at bedside (and daughter Lavella Lemons by telephone) to initiate education related to hospice philosophy, services and team approach to care. Patient verbalized understanding of information given. Per discussion, plan is for discharge to home by ambulance today.   Please send signed and completed DNR form home with patient/family. Patient will need prescriptions for discharge comfort medications.  DME needs have been discussed, patient currently has the following equipment in the home:  Bedside commode, shower chair, wheelchair, cane,walker, CPAP, oxygen setup/tanks with enough oxygen supply through this week for 4L O2 via [place] per patient. Patient/family requests the following DME for delivery to the home: Hospital Bed and over the bedside table. [facility] equipment manager has been notified and will contact [place] to arrange delivery to the home. Home address has been verified and is correct in the chart.      Patient will be the one to contact to arrange time of delivery with cell phone number of [phone].  [facility] Referral Center aware of the above. Please notify [facility] when patient is ready to leave the unit at discharge. (Call [phone] or [phone] after 5pm.) [facility] information and contact numbers given to patient at time of visit. Above information shared with Hassan Rowan, [facility].  Please call with any hospice related questions.  Thank you for this referral.  Gar Ponto, [facility] Liaison [place] found on AMION

## 2018-09-16 NOTE — Discharge Summary (Signed)
Physician Discharge Summary  [name] TGP:[id number] DOB: Jul 22, 1946 DOA: 09/13/2018  PCP: Merrilee Seashore, MD  Admit date: 09/13/2018 Discharge date: 09/16/2018  Admitted From: Home Disposition: Home with hospice   Recommendations for Outpatient Follow-up:  1. Follow up with home hospice through [facility]: Per Hospice Equipment/Devices: Per Hospice Discharge Condition: Stable for discharge, guarded prognosis CODE STATUS: DNR Diet recommendation: Renal, carb-modified  Brief/Interim Summary: [name] is a 73 y.o. female with history of chronic kidney disease stage IV, sleep apnea, interstitial lung disease, CAD, A. fib, hypothyroidism, hypertension, diastolic CHF had followed up with pulmonologist and cardiologist.  At that time patient did complain of increasing shortness of breath which has been progressing over the last few weeks with orthopnea and lower extremity edema. She was found to have progressive worsening of renal failure (creatinine >8 from 4) on labs and was directed to the ED. The patient was felt to have volume overload due to renal failure though has insistently declined consideration of hemodialysis. IV lasix was started and she was admitted 1/10. Diuresis was sluggish, augmented 1/11 with some improvement. Palliative care medicine met with the patient and spoke with her daughter 1/12. Her goal of care is comfort only and would like to go home until she develops symptoms requiring transfer to residential hospice. She is back to her respiratory baseline, still requiring 4L of supplemental oxygen, and under her previous dry weight.   Discharge Diagnoses:  Principal Problem:   ARF (acute renal failure) (HCC) Active Problems:   Obstructive sleep apnea   CKD (chronic kidney disease) stage 5, GFR less than 15 ml/min (HCC)   DM (diabetes mellitus), type 2 with renal complications (HCC)   Hypertension   Hyperlipidemia  Atrial fibrillation ([place])   Pulmonary fibrosis (HCC)  AKI on stage IV CKD, now stage V CKD: Declining dialysis. Renal U/S without obstruction, and oddly without cortical thinning/atrophy/increased echogenicity. - Given lasix 179m IVPB q12h daily metolazone. Weight down 2lbs 246 > 244lbs which is well below previous weights. Creatinine stable. Plan to continue torsemide 1066mBID. Per phone discussion with nephrology, Dr. DeJimmy Footmanwill also give 4055mpotassium with each dose (less than previous dosing) - NAGMA is mild, no indication for bicarbonate at this time - Consulted palliative care for goals of care discussions: will DC home after [facility] touches base with her in the hospital to establish home hospice. Starting oxycodone for dyspnea.  Hypokalemia: Worsened in light of augmented diuresis and holding potassium, but after discussions with nephrology, will restart supplementation. Instead of QID administration will give BID with torsemide due to concern for hyperkalemia with worsening renal function.  Acute on chronic diastolic CHF:  - Diuresis as above  Chronic hypoxic respiratory failure due to ILD due to recurrent aspiration:  - Followed by Dr. SooHalford Chessmano placed referral to palliative care the day prior to arrival.  - Continue nebulized therapies - Continue supplemental oxygen, 4L pulsed.  T2DM:  - Continue SSI, lantus was decreased by half due to renal impairment though she's been hyperglycemic and dose will be returned to normal at discharge.   Chronic atrial fibrillation and tachybradycardia syndrome s/p PPM:  - Continue amiodarone, metoprolol, diltiazem   - Continue eliquis  Hyperlipidemia:  - Continue statin  OSA:  - CPAP qHS  Hypothyroidism: TSH 2.797  - Continue synthroid  CAD denies any chest pain.  Morbid obesity: BMI 42.  - Weight loss recommended  Insomnia:  - Continue ambien  with great caution, recommended dose be lowered to 56m.  Discharge  Instructions Discharge Instructions    Diet - low sodium heart healthy   Complete by:  As directed    Diet Carb Modified   Complete by:  As directed    Discharge instructions   Complete by:  As directed    You were admitted for progressive renal failure which is currently stable. Your breathing is stable enough to be discharged. You will be followed by hospice services at home. If you have any concerns about your health or symptoms, seek advise from them. You can call them day or night.  - If you have increasing shortness of breath, you can try taking oxycodone to help - Continue taking torsemide 104mtwice daily and decrease the amount of potassium you take to 4023mtwice daily (with the torsemide). - It is recommended that you take no more than 5 mg of ambien if needed for sleep.   Increase activity slowly   Complete by:  As directed      Allergies as of 09/16/2018      Reactions   Imdur [isosorbide Dinitrate] Anaphylaxis, Other (See Comments)   Headache, also   Adhesive [tape] Itching   EKG leads   Sulfa Antibiotics Itching, Nausea And Vomiting   "Everything I saw was red"   Gabapentin Other (See Comments)   Caused tremors, legs buckled, and couldn't hold onto anything   Latex Hives   Lyrica [pregabalin] Other (See Comments)   Caused tremors, legs buckled, and couldn't hold onto anything   Codeine Nausea And Vomiting   Penicillins Nausea And Vomiting   DID THE REACTION INVOLVE: Swelling of the face/tongue/throat, SOB, or low BP? No Sudden or severe rash/hives, skin peeling, or the inside of the mouth or nose? Unk Did it require medical treatment? Unk When did it last happen?Childhood If all above answers are "NO", may proceed with cephalosporin use.      Medication List    TAKE these medications   acetaminophen 500 MG tablet Commonly known as:  TYLENOL Take 500-1,000 mg by mouth every 6 (six) hours as needed for mild pain or headache.   albuterol (2.5 MG/3ML) 0.083%  nebulizer solution Commonly known as:  PROVENTIL Take 3 mLs (2.5 mg total) by nebulization every 6 (six) hours as needed for shortness of breath.   amiodarone 200 MG tablet Commonly known as:  PACERONE Take 1 tablet (200 mg total) by mouth daily.   amitriptyline 25 MG tablet Commonly known as:  ELAVIL Take 25 mg by mouth at bedtime.   atorvastatin 40 MG tablet Commonly known as:  LIPITOR TAKE 1 TABLET BY MOUTH ONCE DAILY What changed:  when to take this   CARTIA XT 180 MG 24 hr capsule Generic drug:  diltiazem TAKE 1 CAPSULE BY MOUTH ONCE DAILY What changed:  how much to take   dicyclomine 20 MG tablet Commonly known as:  BENTYL Take 20 mg by mouth daily at 12 noon.   ELIQUIS 5 MG Tabs tablet Generic drug:  apixaban TAKE 1 TABLET BY MOUTH TWICE DAILY What changed:  how much to take   esomeprazole 40 MG capsule Commonly known as:  NEXIUM TAKE 1 CAPSULE BY MOUTH TWICE DAILY BEFORE MEAL(S) What changed:  See the new instructions.   hydrALAZINE 25 MG tablet Commonly known as:  APRESOLINE TAKE 1 TABLET BY MOUTH THREE TIMES DAILY   insulin glargine 100 UNIT/ML injection Commonly known as:  LANTUS Inject 60 Units into  the skin daily after breakfast.   isosorbide mononitrate 60 MG 24 hr tablet Commonly known as:  IMDUR Take 1 tablet (60 mg total) by mouth daily.   levothyroxine 25 MCG tablet Commonly known as:  SYNTHROID, LEVOTHROID Take 25 mcg by mouth daily before lunch.   LINZESS 290 MCG Caps capsule Generic drug:  linaclotide Take 290 mcg by mouth daily before breakfast.   metolazone 2.5 MG tablet Commonly known as:  ZAROXOLYN Take 1 tablet (2.5 mg total) by mouth 2 (two) times a week. Tuesday and Friday What changed:  additional instructions   metoprolol tartrate 25 MG tablet Commonly known as:  LOPRESSOR TAKE 3 TABLETS BY MOUTH TWICE DAILY What changed:    when to take this  additional instructions   nitroGLYCERIN 0.4 MG SL tablet Commonly known  as:  NITROSTAT Place 0.4 mg under the tongue every 5 (five) minutes x 3 doses as needed for chest pain.   NOVOLOG FLEXPEN 100 UNIT/ML FlexPen Generic drug:  insulin aspart Inject 40 Units into the skin 3 (three) times daily as needed (for a BGL of 200 or greater).   oxyCODONE 5 MG immediate release tablet Commonly known as:  Oxy IR/ROXICODONE Take 0.5-1 tablets (2.5-5 mg total) by mouth every 4 (four) hours as needed (pain or shortness of breath).   OXYGEN Inhale 4 L into the lungs continuous.   potassium chloride SA 20 MEQ tablet Commonly known as:  K-DUR,KLOR-CON Take 2 tablets (40 mEq total) by mouth 2 (two) times daily. What changed:  when to take this   RELION PEN NEEDLES 32G X 4 MM Misc Generic drug:  Insulin Pen Needle   torsemide 100 MG tablet Commonly known as:  DEMADEX Take 100 mg by mouth 2 (two) times daily.   ULORIC 80 MG Tabs Generic drug:  Febuxostat Take 80 mg by mouth daily.   Vitamin D (Ergocalciferol) 1.25 MG (50000 UT) Caps capsule Commonly known as:  DRISDOL Take 50,000 Units by mouth every Monday.   XOPENEX HFA 45 MCG/ACT inhaler Generic drug:  levalbuterol Inhale 2 puffs into the lungs every 4 (four) hours as needed for wheezing or shortness of breath.   zolpidem 10 MG tablet Commonly known as:  AMBIEN Take 10 mg by mouth at bedtime.      Follow-up Information    [facility] Follow up.   Why:  They will do your hospice care at your home Contact information: [place]       Merrilee Seashore, MD Follow up.   Specialty:  Internal Medicine Contact information: [address] [place] [address] [phone]        Bensimhon, Shaune Pascal, MD Follow up.   Specialty:  Cardiology Contact information: [place] [address] [phone]          Allergies  Allergen Reactions  . Imdur [Isosorbide  Dinitrate] Anaphylaxis and Other (See Comments)    Headache, also   . Adhesive [Tape] Itching    EKG leads  . Sulfa Antibiotics Itching and Nausea And Vomiting    "Everything I saw was red"  . Gabapentin Other (See Comments)    Caused tremors, legs buckled, and couldn't hold onto anything   . Latex Hives  . Lyrica [Pregabalin] Other (See Comments)    Caused tremors, legs buckled, and couldn't hold onto anything   . Codeine Nausea And Vomiting  . Penicillins Nausea And Vomiting  DID THE REACTION INVOLVE: Swelling of the face/tongue/throat, SOB, or low BP? No Sudden or severe rash/hives, skin peeling, or the inside of the mouth or nose? Unk Did it require medical treatment? Unk When did it last happen?Childhood If all above answers are "NO", may proceed with cephalosporin use.     Consultations:  Palliative care medicine  Nephrology, Dr. Jimmy Footman by phone only  Procedures/Studies: US Renal  Result Date: 09/14/2018 CLINICAL DATA:  Patient with acute renal failure. EXAM: RENAL / URINARY TRACT ULTRASOUND COMPLETE COMPARISON:  CT abdomen pelvis 02/17/2015 FINDINGS: Right Kidney: Renal measurements: 8.0 x 4.9 x 4.7 cm = volume: 94.5 mL. Normal renal cortical thickness and echogenicity. No hydronephrosis. Multiple 1 cm or smaller hypoechoic lesions within the right kidney, incompletely characterized. Left Kidney: Renal measurements: 9.8 x 5.1 x 4.9 cm = volume: 127.1 mL. Normal renal cortical thickness and echogenicity. No hydronephrosis. Hypoechoic lesion measuring up to 1 cm, incompletely characterized. Bladder: Appears normal for degree of bladder distention. IMPRESSION: No hydronephrosis. Electronically Signed   By: Lovey Newcomer M.D.   On: 09/14/2018 10:13   Dg Chest Port 1 View  Result Date: 09/13/2018 CLINICAL DATA:  Elevated creatinine. EXAM: PORTABLE CHEST 1 VIEW COMPARISON:  Chest x-ray dated December 16, 2015. FINDINGS: Unchanged left chest wall pacemaker. Stable cardiomegaly.  Normal pulmonary vascularity. No focal consolidation, pleural effusion, or pneumothorax. No acute osseous abnormality. IMPRESSION: No active disease. Electronically Signed   By: Titus Dubin M.D.   On: 09/13/2018 21:03    Subjective: Breathing back to baseline, which is to say she gets very short of breath with moderate exertion, still able to get around the room and to the bathroom. No cough, chest pain, palpitations, and leg swelling is very minimal. Wants to go home.  Discharge Exam: Vitals:   09/16/18 0615 09/16/18 0907  BP: 129/72   Pulse: 70 70  Resp: 18   Temp: 98.3 F (36.8 C)   SpO2: 97%    General: Pt is alert, awake, not in acute distress Cardiovascular: RRR, S1/S2 +, no rubs, no gallops Respiratory: Nonlabored on 4LPM, clear without crackles. Abdominal: Soft, NT, ND, bowel sounds + Extremities: Trace edema, no cyanosis  Labs: BNP (last 3 results) Recent Labs    10/19/17 1125 09/15/18 0441  BNP 139.4* 280.0*   Basic Metabolic Panel: Recent Labs  Lab 09/13/18 1037 09/14/18 0358 09/15/18 0441 09/16/18 0514  NA 140 139 140 139  K 4.7 4.1 3.4* 2.8*  CL 109 109 105 99  CO2 19* 19* 22 24  GLUCOSE 174* 182* 185* 206*  BUN 83* 84* 84* 86*  CREATININE 8.92* 9.06* 9.00* 9.18*  CALCIUM 9.4 8.9 9.0 8.7*   Liver Function Tests: Recent Labs  Lab 09/13/18 1037  AST 16  ALT 16  ALKPHOS 52  BILITOT 0.6  PROT 7.4  ALBUMIN 4.0   No results for input(s): LIPASE, AMYLASE in the last 168 hours. No results for input(s): AMMONIA in the last 168 hours. CBC: Recent Labs  Lab 09/13/18 1038 09/14/18 0358  WBC 6.1 6.3  HGB 13.2 11.8*  HCT 42.3 36.3  MCV 101.4* 99.2  PLT 187 171   Cardiac Enzymes: No results for input(s): CKTOTAL, CKMB, CKMBINDEX, TROPONINI in the last 168 hours. BNP: Invalid input(s): POCBNP CBG: Recent Labs  Lab 09/15/18 1152 09/15/18 1643 09/15/18 2113 09/16/18 0753 09/16/18 1115  GLUCAP 230* 172* 285* 193* 242*   D-Dimer No  results for input(s): DDIMER in the last 72 hours. Hgb A1c No  results for input(s): HGBA1C in the last 72 hours. Lipid Profile No results for input(s): CHOL, HDL, LDLCALC, TRIG, CHOLHDL, LDLDIRECT in the last 72 hours. Thyroid function studies No results for input(s): TSH, T4TOTAL, T3FREE, THYROIDAB in the last 72 hours.  Invalid input(s): FREET3 Anemia work up No results for input(s): VITAMINB12, FOLATE, FERRITIN, TIBC, IRON, RETICCTPCT in the last 72 hours. Urinalysis    Component Value Date/Time   COLORURINE YELLOW 12/09/2013 1312   APPEARANCEUR CLEAR 12/09/2013 1312   LABSPEC 1.017 12/09/2013 1312   PHURINE 6.0 12/09/2013 1312   GLUCOSEU NEGATIVE 12/09/2013 1312   HGBUR NEGATIVE 12/09/2013 1312   BILIRUBINUR NEGATIVE 12/09/2013 1312   KETONESUR NEGATIVE 12/09/2013 1312   PROTEINUR NEGATIVE 12/09/2013 1312   UROBILINOGEN 1.0 12/09/2013 1312   NITRITE NEGATIVE 12/09/2013 1312   LEUKOCYTESUR SMALL (A) 12/09/2013 1312    Microbiology No results found for this or any previous visit (from the past 240 hour(s)).  Time coordinating discharge: Approximately 40 minutes  Patrecia Pour, MD  [facility] 09/16/2018, 11:33 AM Pager [phone]

## 2018-09-17 ENCOUNTER — Ambulatory Visit: Payer: Self-pay | Admitting: Pharmacist

## 2018-09-17 ENCOUNTER — Other Ambulatory Visit: Payer: Self-pay | Admitting: Pharmacist

## 2018-09-17 ENCOUNTER — Other Ambulatory Visit: Payer: Self-pay

## 2018-09-17 NOTE — Patient Outreach (Signed)
  [facility]) Care Management Chronic Special Needs Program  09/17/2018  Name: NYKIAH MA DOB: [birth year]  MRN: [id number]  Ms. Heidie Krall is enrolled in a chronic special needs plan for Diabetes. Reviewed and updated care plan.  Admitted 1/10-1/13/2020 with acute renal failure. Discharged home with Hospice care.  Goals Addressed            This Visit's Progress   . Client will have food insecurities addressed within 3 months   On track    Referral Made to [facility] worker. A social worker will call you.    Marland Kitchen Client will increase activity tolerance within the next year.   No change   . Client will report no fall or injuries in the next 3 months.   On track   . Client will use Assistive Devices as needed and verbalize understanding of device use   On track   . Client will verbalize knowledge of diabetes self-management as evidenced by Hgb A1C <7 or as defined by provider.   On track    Diabetes self management actions:  Glucose monitoring per provider recommendations  Perform Quality checks on blood meter  Eat Healthy  Check feet daily  Visit provider every 3-6 months as directed  Hbg A1C level every 3-6 months.  Eye Exam yearly    . Client will verbalize knowledge of Heart Failure disease self management skills with 9 months.   On track   . Client/Caregiver will verbalize understanding of instructions related to self-care and safety   On track    Continue to use your walker as recommended. Continue to be careful when walking around the oxygen tubing. Keep your walkways clear of anything you can trip on and limit throw rugs that can be a tripping hazard.    . General - Client will not be readmitted within 30 days (C-SNP)   On track    Please follow all discharge instructions. Please attend follow up appointments as scheduled. Please Take Medications as Prescribed. Please Call as Needed.    . Maintain timely refills of  diabetic medication as prescribed within the year .   On track   . Obtain annual  Lipid Profile, LDL-C   On track   . Obtain Annual Eye (retinal)  Exam    On track   . Obtain Annual Foot Exam   On track   . Obtain annual screen for micro albuminuria (urine) , nephropathy (kidney problems)   On track   . Obtain Hemoglobin A1C at least 2 times per year   On track   . Visit Primary Care Provider or Endocrinologist at least 2 times per year    On track      Plan:  Send individual care plan to provider. Send updated care plan to client. assign EMMI general discharge. Continue to follow.    Thea Silversmith, RN, MSN, [place] [phone]

## 2018-09-17 NOTE — Patient Outreach (Addendum)
[facility]) Care Management  [place]   09/17/2018  [name] 1945/12/29 [id number]  Reason for referral: Medication Management, assistance with adherence due to memory loss, assistance with obtaining diabetic supplies  Referral source: [facility] RN Current insurance:[employer]  PMHx includes but not limited to: CKD V (baseline Scr ~4), HFpEF- NYHA III (last EF 55-60% in 03/2017), atrial fibrillation on chronic anticoagulation, PPM placement, hypothyroidism, T2DM, HTN, HLD, OSA, ILD, CAD s/p DES placement in 2012, GERD, and IBS.  Per notes, patient has denied wanted HD in the past.    Patient hospitalized 1/10-1/13 for worsening of renal failure and volume overload.  Discharged with home hospice.   Outreach:  Successful telephone call with patient and daughter.  HIPAA identifiers verified.   Subjective:  Spoke with daughter regarding patient's medication compliance.  Daughter fills a weekly pillbox for her and states that patient has not had any issues with missing doses.  She reports that a few months ago this happened a few times but is no longer an issue. She also denies needing any assistance with obtaining diabetic supplies.  Daughter reports patient has all supplies of pen needles and glucometer / test strips / lancets right now.   Objective: Lab Results  Component Value Date   CREATININE 9.18 (H) 09/16/2018   CREATININE 9.00 (H) 09/15/2018   CREATININE 9.06 (H) 09/14/2018    Lab Results  Component Value Date   HGBA1C 6.9 (H) 02/17/2015    Lipid Panel     Component Value Date/Time   CHOL 185 09/13/2018 1037   TRIG 232 (H) 09/13/2018 1037   HDL 49 09/13/2018 1037   CHOLHDL 3.8 09/13/2018 1037   VLDL 46 (H) 09/13/2018 1037   LDLCALC 90 09/13/2018 1037    BP Readings from Last 3 Encounters:  09/16/18 129/72  09/13/18 (!) 144/88  09/13/18 120/84    Allergies  Allergen Reactions  . Imdur [Isosorbide Dinitrate] Anaphylaxis  and Other (See Comments)    Headache, also   . Adhesive [Tape] Itching    EKG leads  . Sulfa Antibiotics Itching and Nausea And Vomiting    "Everything I saw was red"  . Gabapentin Other (See Comments)    Caused tremors, legs buckled, and couldn't hold onto anything   . Latex Hives  . Lyrica [Pregabalin] Other (See Comments)    Caused tremors, legs buckled, and couldn't hold onto anything   . Codeine Nausea And Vomiting  . Penicillins Nausea And Vomiting    DID THE REACTION INVOLVE: Swelling of the face/tongue/throat, SOB, or low BP? No Sudden or severe rash/hives, skin peeling, or the inside of the mouth or nose? Unk Did it require medical treatment? Unk When did it last happen?Childhood If all above answers are "NO", may proceed with cephalosporin use.     Medications Reviewed Today    Reviewed by Rise Patience, MD (Physician) on 09/13/18 at 2049  Med List Status: Complete  Medication Order Taking? Sig Documenting Provider Last Dose Status Informant  acetaminophen (TYLENOL) 500 MG tablet [id number] Yes Take 500-1,000 mg by mouth every 6 (six) hours as needed for mild pain or headache. [provider] unk unk Active Multiple Informants  albuterol (PROVENTIL) (2.5 MG/3ML) 0.083% nebulizer solution [id number] Yes Take 3 mLs (2.5 mg total) by nebulization every 6 (six) hours as needed for shortness of breath. Larey Dresser, MD 09/12/2018 Unknown time Active Multiple Informants  amiodarone (PACERONE) 200 MG tablet [id number] Yes Take 1 tablet (  200 mg total) by mouth daily. Larey Dresser, MD 09/13/2018 am Active Multiple Informants  amitriptyline (ELAVIL) 25 MG tablet [id number] Yes Take 25 mg by mouth at bedtime.   [provider] 09/12/2018 pm Active Multiple Informants  atorvastatin (LIPITOR) 40 MG tablet [id number] Yes TAKE 1 TABLET BY MOUTH ONCE DAILY  Patient taking differently:  Take 40 mg by mouth at bedtime.    Larey Dresser, MD 09/12/2018 pm Active  Multiple Informants  CARTIA XT 180 MG 24 hr capsule [id number] Yes TAKE 1 CAPSULE BY MOUTH ONCE DAILY  Patient taking differently:  Take 180 mg by mouth daily.    Larey Dresser, MD 09/13/2018 am Active Multiple Informants  dicyclomine (BENTYL) 20 MG tablet [id number] Yes Take 20 mg by mouth daily at 12 noon. [provider] 09/13/2018 Unknown time Active Multiple Informants  ELIQUIS 5 MG TABS tablet [id number] Yes TAKE 1 TABLET BY MOUTH TWICE DAILY  Patient taking differently:  Take 5 mg by mouth 2 (two) times daily.    Larey Dresser, MD 09/13/2018 0800 Active Multiple Informants  esomeprazole (NEXIUM) 40 MG capsule [id number] Yes TAKE 1 CAPSULE BY MOUTH TWICE DAILY BEFORE MEAL(S)  Patient taking differently:  Take 40 mg by mouth daily before breakfast.    Yetta Flock, MD 09/13/2018 am Active Multiple Informants  hydrALAZINE (APRESOLINE) 25 MG tablet [id number] Yes TAKE 1 TABLET BY MOUTH THREE TIMES DAILY  Patient taking differently:  Take 25 mg by mouth 3 (three) times daily.    Larey Dresser, MD 09/13/2018 1200 Active Multiple Informants  insulin glargine (LANTUS) 100 UNIT/ML injection [id number] Yes Inject 60 Units into the skin daily after breakfast.  [provider] 09/12/2018 am Active Multiple Informants           Med Note (DALY, KATHY N   Fri Sep 13, 2018  7:25 PM) Regimen confirmed to be accurate by the patient  isosorbide mononitrate (IMDUR) 60 MG 24 hr tablet [id number] No Take 1 tablet (60 mg total) by mouth daily. Georgiana Shore, NP Not yet Not yet Active Multiple Informants           Med Note Duffy Bruce, Legrand Como   Fri Sep 13, 2018  7:33 PM) New order; not yet started  levothyroxine (SYNTHROID, LEVOTHROID) 25 MCG tablet [id number] Yes Take 25 mcg by mouth daily before lunch.  [provider] 09/13/2018 1200 Active Multiple Informants           Med Note Carla Drape   Wed Jan 05, 2016  4:04 PM)    Rolan Lipa 290 MCG CAPS capsule [id number] Yes Take 290 mcg by  mouth daily before breakfast.  [provider] 09/12/2018 Unknown time Active Multiple Informants           Med Note Owens Shark, JASMINE Q   Fri May 04, 2017 11:05 AM)    metolazone (ZAROXOLYN) 2.5 MG tablet [id number] Yes Take 1 tablet (2.5 mg total) by mouth 2 (two) times a week. Tuesday and Friday  Patient taking differently:  Take 2.5 mg by mouth 2 (two) times a week. TUESDAYS and Eliot Ford, MD 09/10/2018 am Active Multiple Informants  metoprolol tartrate (LOPRESSOR) 25 MG tablet [id number] Yes TAKE 3 TABLETS BY MOUTH TWICE DAILY  Patient taking differently:  Take 75 mg by mouth See admin instructions. Take 75 mg by mouth two times a day- 5:30 PM and 9:30 PM   Bensimhon, Shaune Pascal, MD 09/12/2018 2130 Active Multiple  Informants           Med Note Duffy Bruce, Legrand Como   Fri Sep 13, 2018  7:27 PM) Regimen confirmed to be accurate by the patient's daughter- I asked twice  nitroGLYCERIN (NITROSTAT) 0.4 MG SL tablet [id number] Yes Place 0.4 mg under the tongue every 5 (five) minutes x 3 doses as needed for chest pain.  [provider] unk unk Active Multiple Informants  NOVOLOG FLEXPEN 100 UNIT/ML FlexPen [id number] Yes Inject 40 Units into the skin 3 (three) times daily as needed (for a BGL of 200 or greater).  [provider] 09/12/2018 pm Active Multiple Informants           Med Note Duffy Bruce, Legrand Como   Fri Sep 13, 2018  7:34 PM) Regimen confirmed to be accurate by the patient  OXYGEN [id number] Yes Inhale 4 L into the lungs continuous.  [provider] Continuous Continuous Active Multiple Informants           Med Note Duffy Bruce, Legrand Como   Fri Sep 13, 2018  6:56 PM)    potassium chloride SA (K-DUR,KLOR-CON) 20 MEQ tablet [id number] Yes Take 40 mEq by mouth 4 (four) times daily.  [provider] 09/13/2018 1200 Active Multiple Informants           Med Note Duffy Bruce, Legrand Como   Fri Sep 13, 2018  7:32 PM) Regimen confirmed to be accurate by the patient  RELION PEN  NEEDLES 32G X 4 MM MISC [id number]   [provider]  Active Multiple Informants           Med Note Duffy Bruce, Legrand Como   Fri Sep 13, 2018  6:56 PM)    torsemide (DEMADEX) 100 MG tablet [id number] Yes Take 100 mg by mouth 2 (two) times daily. [provider] 09/13/2018 am Active Multiple Informants  ULORIC 80 MG TABS [id number] Yes Take 80 mg by mouth daily.  [provider] 09/13/2018 Unknown time Active Multiple Informants           Med Note Owens Shark, JASMINE Q   Tue Apr 04, 2016 10:02 AM)    Vitamin D, Ergocalciferol, (DRISDOL) 50000 UNITS CAPS capsule [id number] Yes Take 50,000 Units by mouth every Monday. [provider] 09/09/2018 Unknown time Active Multiple Informants  XOPENEX HFA 45 MCG/ACT inhaler [id number] Yes Inhale 2 puffs into the lungs every 4 (four) hours as needed for wheezing or shortness of breath. Whiteheart, Cristal Ford, NP unk unk Active Multiple Informants  zolpidem (AMBIEN) 10 MG tablet [id number] Yes Take 10 mg by mouth at bedtime.  [provider] 09/12/2018 pm Active Multiple Informants          ASSESSMENT: Date Discharged from Hospital: 09/16/2018 Date Medication Reconciliation Performed: 09/17/2018  Medications:  New at Discharge: . Oxycodone  Adjustments at Discharge: . Potassium  Discontinued at Discharge:   None   Patient was recently discharged from hospital and all medications have been reviewed.  Drugs sorted by system:  Neurologic/Psychologic:amitriptyline, zolpidem  Cardiovascular:amiodarone, atorvastatin, diltiazem, hydralazine, isosorbide mononitrate, metolazone, torsemide, metoprolol tartrate, SL NTG  Pulmonary/Allergy:albuterol, levalbuterol  Gastrointestinal:dicyclomine, esomeprazole, linzess  Endocrine:insulin glargine, insulin aspart, levothyroxine  Pain: acetaminophen, oxycodone IR  Vitamins/Minerals/Supplements:potassium, vitamin D   Hematologic: apixaban   Miscellaneous: febuxostat  Medication  Review Findings:  1. Eliquis 5mg  BID: Per PI, dose reduction to 2.5mg  BID with 2 of the 3 following paramters: age > 50 YO, SCr > 1.5 mg/dL, or weight < 60kg.  Patients in clinical trials were  excluded if Scr >2.5. May consider reducing dose or changing to warfarin due to elevated elevated SCr / CKD-V.   2. Uloric 80mg  daily. Per PI, for CrCl < 30 mL/min, maximum dose 40mg  once daily.  Also has BBW for increased risk of heart-related death and death from all causes compared to allopurinol. Consider reducing dose based on renal function or changing therapy as clinically warranted.   3. Zolpidem 10mg  daily. Per PI, avoid use in geriatrics due to CNS side effects. Consider dose reduction to 5mg  daily if clinically warranted.  4. Allergy to isosorbide mononitrate  - patient not taking.  5. Lantus - using lower number of units per verbal instructions from provider per patient report    Medication Assistance Findings:  No medication assistance needs identified  Plan: Hospice evaluation in home this week with medication regimen likely to be adjusted.  Will route note cardiology and PCP.   Will route note to [facility] RN involved with CSNP.  Will close [facility] pharmacy case as no further medication needs at this time.    Ralene Bathe, PharmD, [place] [phone]    Addendum: Spoke with pharmacist at PCP office regarding medication findings above.  Pharmacist will discuss with PCP and have patient come into office for f/u and to adjust medications as clinically warranted.   Ralene Bathe, PharmD, [place] [phone]

## 2018-09-17 NOTE — Progress Notes (Signed)
Remote pacemaker transmission.   

## 2018-09-18 ENCOUNTER — Other Ambulatory Visit: Payer: Self-pay

## 2018-09-18 LAB — CUP PACEART REMOTE DEVICE CHECK
Battery Remaining Longevity: 104 mo
Battery Remaining Percentage: 95.5 %
Battery Voltage: 2.98 V
Brady Statistic AP VP Percent: 19 %
Brady Statistic AP VS Percent: 81 %
Brady Statistic AS VP Percent: 1 %
Brady Statistic AS VS Percent: 1 %
Brady Statistic RA Percent Paced: 98 %
Brady Statistic RV Percent Paced: 19 %
Date Time Interrogation Session: 20200114133029
Implantable Lead Implant Date: 20151015
Implantable Lead Implant Date: 20151015
Implantable Lead Location: 753859
Implantable Lead Location: 753860
Implantable Lead Model: 1948
Implantable Pulse Generator Implant Date: 20151015
Lead Channel Impedance Value: 440 Ohm
Lead Channel Impedance Value: 540 Ohm
Lead Channel Pacing Threshold Amplitude: 0.75 V
Lead Channel Pacing Threshold Amplitude: 1.25 V
Lead Channel Pacing Threshold Pulse Width: 0.5 ms
Lead Channel Pacing Threshold Pulse Width: 0.5 ms
Lead Channel Sensing Intrinsic Amplitude: 11 mV
Lead Channel Sensing Intrinsic Amplitude: 5 mV
Lead Channel Setting Pacing Amplitude: 2 V
Lead Channel Setting Pacing Amplitude: 2.5 V
Lead Channel Setting Pacing Pulse Width: 0.5 ms
Lead Channel Setting Sensing Sensitivity: 2 mV
Pulse Gen Model: 2240
Pulse Gen Serial Number: 7665001

## 2018-09-18 NOTE — Patient Outreach (Signed)
  [place] [facility]) Care Management Chronic Special Needs Program   09/18/2018  Name: [name]            DOB: [birth year]  MRN: [id number]  Ms. Kiira Brach is enrolled in a chronic special needs plan for Diabetes.  The client was discussed in today's interdisciplinary care team meeting.  The following issues were discussed:  Client's needs, Key risk triggers/risk stratification, Care Plan, Coordination of care, Care transitions and Issues/barriers to care  Participants present:  Mahlon Gammon, MSN, RN, CCM, CNS; Thea Silversmith, MSN, RN, CCM; Peter Garter RN,BSN,CCM, CDE; Rana Snare, MD; Marco Collie, MD  Recommendations: Continue to follow.  Plan: Care Coordination with previously referred resources;  and coordinate with Hospice as needed.  Follow-up:  one month.  Thea Silversmith, RN, MSN, [place] [phone]

## 2018-09-18 NOTE — Patient Outreach (Signed)
[facility]) Care Management  09/18/2018  [name] 04/23/1946 [id number]   Referral for social work was received from Nadara Eaton, on 09/10/18.  [name] was inpatient from 1/10-1/13.  Successful outreach to [name] today.  She is now under Hospice care.  BSW got contact information for Hospice Social Worker, [name], in order to collaborate with her.  BSW attempted to contact [name] but she did not answer and her voicemail box was full.  BSW will attempt to reach her again before the end of the week.  BSW will likely close case once connecting with her due to Hospice being involved.   [name], BSW Social Worker [phone]

## 2018-09-19 ENCOUNTER — Other Ambulatory Visit (HOSPITAL_COMMUNITY): Payer: Self-pay | Admitting: *Deleted

## 2018-09-19 ENCOUNTER — Other Ambulatory Visit: Payer: Self-pay

## 2018-09-19 ENCOUNTER — Ambulatory Visit: Payer: Self-pay | Admitting: Pharmacist

## 2018-09-19 ENCOUNTER — Telehealth: Payer: Self-pay | Admitting: Pharmacist

## 2018-09-19 MED ORDER — APIXABAN 2.5 MG PO TABS: 2.5000 mg | ORAL_TABLET | Freq: Two times a day (BID) | ORAL | 3 refills | Status: DC

## 2018-09-19 NOTE — Patient Outreach (Signed)
[facility]) Care Management  09/19/2018  [name] 1946-08-25 [id number]   [facility] BSW closing case due to [name] from Hospice providing social work support.    [name], BSW Social Worker [phone]

## 2018-09-19 NOTE — Patient Outreach (Signed)
[facility]) Care Management  09/19/2018  [name] 1945-12-15 [id number]   BSW attempted to contact [name], Hospice Social Worker, again today.  Left voicemail message with Web designer.  BSW called Ms. Lydon to offer referral to Meals on Wheels program for immediate meal delivery based on [facility] contract.  Ms. Odeh reported that she is already receiving Meals on Wheels.  BSW will still attempt to connect with [name] and will likely close case afterwards due to hospice involvement.  [name], BSW Social Worker [phone]

## 2018-09-19 NOTE — Patient Outreach (Signed)
[place] [facility]) Care Management  [place] 09/19/2018  [name] 10-17-1945 [id number]  Message received from cardiologist that recommended apixaban dose should be 2.5mg  BID.    Call placed to patient and daughter, Demetrius Charity, to provide update and counsel on new apixaban dose.  Daughter is aware to either use current supply of 5mg  and split tablet OR pick up new strength 2.5mg  and dispose of 5mg  tablets.     PCP office to f/u with patient directly regarding Uloric dose reduction.   Plan: Keep [facility] pharmacy case closed at this time.   Ralene Bathe, PharmD, [place] [phone]

## 2018-09-24 ENCOUNTER — Ambulatory Visit (HOSPITAL_COMMUNITY): Payer: PPO

## 2018-09-27 DIAGNOSIS — Z515 Encounter for palliative care: Secondary | ICD-10-CM

## 2018-09-27 DIAGNOSIS — N186 End stage renal disease: Secondary | ICD-10-CM

## 2018-09-27 DIAGNOSIS — Z992 Dependence on renal dialysis: Secondary | ICD-10-CM

## 2018-09-27 DIAGNOSIS — Z7189 Other specified counseling: Secondary | ICD-10-CM

## 2018-10-01 ENCOUNTER — Other Ambulatory Visit (HOSPITAL_COMMUNITY): Payer: Self-pay | Admitting: Cardiology

## 2018-10-08 ENCOUNTER — Other Ambulatory Visit (HOSPITAL_COMMUNITY): Payer: Self-pay | Admitting: Cardiology

## 2018-10-08 NOTE — Telephone Encounter (Signed)
PCP for refills.

## 2018-10-09 ENCOUNTER — Other Ambulatory Visit (HOSPITAL_COMMUNITY): Payer: Self-pay | Admitting: Cardiology

## 2018-10-10 ENCOUNTER — Other Ambulatory Visit: Payer: Self-pay

## 2018-10-10 NOTE — Patient Outreach (Signed)
[place] [facility]) Care Management Chronic Special Needs Program  10/10/2018  Name: [name] DOB: 11/15/1945  MRN: [id number]  [name] is enrolled in a chronic special needs plan for Diabetes. Reviewed and updated care plan.  Subjective: Client reports she is under Hospice care now. She reports an issue with potassium refill. She also states she needs a refill on her albuterol nebulizer medication. Client reports she is not out of medication, but is time for a refill. She states her pharmacist states that they tried to call to get a refill and was told that she needs to come in to the office to be seen. She reports she has called Patty her nurse with Hospice and left a message. Client reports her blood sugars have been "good" ranging between 151-190's (some ac and some pc).  Goals Addressed            This Visit's Progress   . COMPLETED: Client will have food insecurities addressed within 3 months       Denies issues at this time.    . Client will increase activity tolerance within the next year.   On track   . Client will report no fall or injuries in the next 3 months.   On track   . Client will use Assistive Devices as needed and verbalize understanding of device use   On track   . Client will verbalize knowledge of diabetes self-management as evidenced by Hgb A1C <7 or as defined by provider.   On track    Diabetes self management actions:  Glucose monitoring per provider recommendations  Perform Quality checks on blood meter  Eat Healthy  Check feet daily  Visit provider every 3-6 months as directed  Hbg A1C level every 3-6 months.  Eye Exam yearly    . Client will verbalize knowledge of Heart Failure disease self management skills with 9 months.   On track   . Client/Caregiver will verbalize understanding of instructions related to self-care and safety   On track    Continue to use your walker as recommended. Continue to be careful when walking  around the oxygen tubing. Keep your walkways clear of anything you can trip on and limit throw rugs that can be a tripping hazard.    . General - Client will not be readmitted within 30 days (C-SNP)   On track    Please follow all discharge instructions. Please attend follow up appointments as scheduled. Please Take Medications as Prescribed. Please Call as Needed.    . Maintain timely refills of diabetic medication as prescribed within the year .   On track   . Obtain annual  Lipid Profile, LDL-C   On track   . Obtain Annual Eye (retinal)  Exam    On track   . Obtain Annual Foot Exam   On track   . Obtain annual screen for micro albuminuria (urine) , nephropathy (kidney problems)   On track   . Obtain Hemoglobin A1C at least 2 times per year   On track   . Physical and Spiritual Needs Met       Active with Hospice.    . Symptom Management of disease processes.       Active with Hospice.    . Visit Primary Care Provider or Endocrinologist at least 2 times per year    On track      RNCM discussed with [facility] pharmacist-per notes cardiology referred albuterol refill to be  obtained by her primary care provider.   Plan: RNCM will follow up with Hospice nurse and update client.  Thea Silversmith, RN, MSN, [place] [phone]   10/10/2018 12:00   Update: RNCM spoke with client to update and informed her that her Hospice nurse has received her request and is going to call in those prescriptions that were requested.  RNCM also informed client that [facility] will not be calling her routinely, as Hospice Nurse is managing her care, but that Hospice nurse, Chong Sicilian will update RNCM approximately every three months.   Plan: RNCM will follow up telephonically with client within the next 6-9 months.  Thea Silversmith, RN, MSN, [place] [phone]

## 2018-10-10 NOTE — Patient Outreach (Signed)
  [place] [facility]) Care Management Chronic Special Needs Program    10/10/2018  Name: [name], DOB: Jan 21, 1946  MRN: [id number]   [name] is enrolled in a chronic special needs plan for Diabetes.  Care Coordination RNCM called Hospice for care Coordination. Left Message with client's clinical nurse requesting a call back.   Plan: continue to follow.  Thea Silversmith, RN, MSN, [place] [phone]

## 2018-10-10 NOTE — Patient Outreach (Signed)
  [place] [facility]) Care Management Chronic Special Needs Program    10/10/2018  Name: [name], DOB: 06-Oct-1945  MRN: [id number]   [name] is enrolled in a chronic special needs plan for Diabetes.  Care Coordination: RNCM called to speak with client's nurse. Spoke to someone in the clinical service area, Ms Juleen China, who states she will send a message to [name] and request a return call to [employer].   Plan: continue to follow.  Thea Silversmith, RN, MSN, [place] [phone]

## 2018-10-10 NOTE — Telephone Encounter (Signed)
Refills through PCP

## 2018-10-17 ENCOUNTER — Other Ambulatory Visit (HOSPITAL_COMMUNITY): Payer: Self-pay | Admitting: Internal Medicine

## 2018-10-17 ENCOUNTER — Other Ambulatory Visit (HOSPITAL_COMMUNITY): Payer: Self-pay | Admitting: Cardiology

## 2018-11-15 ENCOUNTER — Other Ambulatory Visit (HOSPITAL_COMMUNITY): Payer: Self-pay | Admitting: Internal Medicine

## 2018-11-15 ENCOUNTER — Encounter (HOSPITAL_COMMUNITY): Payer: PPO | Admitting: Cardiology

## 2018-12-12 DIAGNOSIS — I132 Hypertensive heart and chronic kidney disease with heart failure and with stage 5 chronic kidney disease, or end stage renal disease: Secondary | ICD-10-CM | POA: Diagnosis not present

## 2018-12-12 DIAGNOSIS — E1122 Type 2 diabetes mellitus with diabetic chronic kidney disease: Secondary | ICD-10-CM | POA: Diagnosis not present

## 2018-12-12 DIAGNOSIS — N186 End stage renal disease: Secondary | ICD-10-CM | POA: Diagnosis not present

## 2018-12-12 DIAGNOSIS — I5030 Unspecified diastolic (congestive) heart failure: Secondary | ICD-10-CM | POA: Diagnosis not present

## 2018-12-16 ENCOUNTER — Other Ambulatory Visit: Payer: Self-pay

## 2018-12-16 ENCOUNTER — Ambulatory Visit (INDEPENDENT_AMBULATORY_CARE_PROVIDER_SITE_OTHER): Admitting: *Deleted

## 2018-12-16 DIAGNOSIS — I495 Sick sinus syndrome: Secondary | ICD-10-CM | POA: Diagnosis not present

## 2018-12-16 DIAGNOSIS — I5032 Chronic diastolic (congestive) heart failure: Secondary | ICD-10-CM

## 2018-12-17 LAB — CUP PACEART REMOTE DEVICE CHECK
Battery Remaining Longevity: 103 mo
Battery Remaining Percentage: 95.5 %
Battery Voltage: 2.98 V
Brady Statistic AP VP Percent: 22 %
Brady Statistic AP VS Percent: 78 %
Brady Statistic AS VP Percent: 1 %
Brady Statistic AS VS Percent: 1 %
Brady Statistic RA Percent Paced: 98 %
Brady Statistic RV Percent Paced: 22 %
Date Time Interrogation Session: 20200414034740
Implantable Lead Implant Date: 20151015
Implantable Lead Implant Date: 20151015
Implantable Lead Location: 753859
Implantable Lead Location: 753860
Implantable Lead Model: 1948
Implantable Pulse Generator Implant Date: 20151015
Lead Channel Impedance Value: 440 Ohm
Lead Channel Impedance Value: 540 Ohm
Lead Channel Pacing Threshold Amplitude: 0.75 V
Lead Channel Pacing Threshold Amplitude: 1.25 V
Lead Channel Pacing Threshold Pulse Width: 0.5 ms
Lead Channel Pacing Threshold Pulse Width: 0.5 ms
Lead Channel Sensing Intrinsic Amplitude: 11 mV
Lead Channel Sensing Intrinsic Amplitude: 2.8 mV
Lead Channel Setting Pacing Amplitude: 2 V
Lead Channel Setting Pacing Amplitude: 2.5 V
Lead Channel Setting Pacing Pulse Width: 0.5 ms
Lead Channel Setting Sensing Sensitivity: 2 mV
Pulse Gen Model: 2240
Pulse Gen Serial Number: 7665001

## 2018-12-23 NOTE — Progress Notes (Signed)
Remote pacemaker transmission.   

## 2018-12-30 ENCOUNTER — Encounter: Payer: Self-pay | Admitting: Internal Medicine

## 2018-12-30 NOTE — Progress Notes (Signed)
Merlin alert for recurrent persistent AF since 12/29/18. Presenting rhythm still AF. Will send to AF clinic to see if they can do virtual visit with her this week.        Chanetta Marshall, NP 12/30/2018 10:35 AM

## 2018-12-30 NOTE — Progress Notes (Signed)
Left message for pt to return my call.  Discussed with patient - she does endorse feeling more short of breath - she doesn't leave home anymore and is followed by hospice although they have not been able to come out and assess her recently due to covid restrictions. She is agreeable to a telephone visit tomorrow with Roderic Palau NP - she will try to take her BP/Hr prior to visit. She weighed herself yesterday at 242lbs but did not weigh herself today. She will try to weigh before tomorrrows appt.

## 2018-12-31 ENCOUNTER — Encounter (HOSPITAL_COMMUNITY): Payer: Self-pay | Admitting: Nurse Practitioner

## 2018-12-31 ENCOUNTER — Other Ambulatory Visit: Payer: Self-pay

## 2018-12-31 ENCOUNTER — Ambulatory Visit (HOSPITAL_COMMUNITY)
Admission: RE | Admit: 2018-12-31 | Discharge: 2018-12-31 | Disposition: A | Discharge status: Home / Self Care | Payer: HMO | Source: Ambulatory Visit | Attending: Nurse Practitioner | Admitting: Nurse Practitioner

## 2018-12-31 DIAGNOSIS — I509 Heart failure, unspecified: Secondary | ICD-10-CM

## 2018-12-31 DIAGNOSIS — I4891 Unspecified atrial fibrillation: Secondary | ICD-10-CM

## 2018-12-31 MED ORDER — AMIODARONE HCL 200 MG PO TABS: ORAL_TABLET | ORAL | 1 refills | Status: DC

## 2018-12-31 NOTE — H&P (View-Only) (Signed)
I suspect she will be significantly more symptomatic in afib.  If she does not convert to NSR on her own, set her up for DCCV with me please.

## 2018-12-31 NOTE — Progress Notes (Signed)
Blood pressure 107/66, pulse 73, height 5\' 4"  (1.626 m), weight 109.8 kg.

## 2018-12-31 NOTE — Progress Notes (Signed)
Electrophysiology TeleHealth Note   Due to national recommendations of social distancing due to [place] 19, Audio/video telehealth visit is felt to be most appropriate for this patient at this time.  See MyChart message/consent below from today for patient consent regarding telehealth for the [facility].    Date:  12/31/2018   ID:  [name], DOB 1945/09/24, MRN [id number]  Location: home  Provider location: [address] [place], [place] [address] Evaluation Performed: Follow up afib x 2 days per device clinic  PCP:  Merrilee Seashore, MD  Primary Cardiologist:   Dr. Aundra Dubin Primary Electrophysiologist: Dr. Rayann Heman  BT:[id number] afib since 4/26   History of Present Illness: [name] is a 73 y.o. female who presents via audio/video conferencing for a telehealth visit today.   The patient is referred for afib on device sine 4/26.   Pt is already on amiodarone for PAF for some time and has a very complicated history  chronic kidney disease stage IV, sleep apnea, interstitial lung disease, CAD, A. fib, hypothyroidism, hypertension, diastolic CHF and has been in Hospice since January 2020. Pt has chosen not to pursue dialysis and her creatinine by last lab 09/16/18 was 9.13. She has been in afib for the last 2 days, pt knows of no change in meds or overall condition. She has noted increased shortness of breath since that time, her weight is actually down 2 lbs from Sunday when it was recorded at 242 lbs and 239 lbs today. Her BP is soft at 646 systolic and her HR's is running in the 80's, on CCB/BB daily.She has chronic LLE and abdominal swelling. Daughter states that her eliquis was reduced to 2.5 mg bid by a doctor, unsure which one, after her hospitalization in January.  Today, she denies symptoms of palpitations, chest pain, , orthopnea, PND, lower extremity edema, claudication, dizziness, presyncope, syncope, bleeding, or neurologic sequela.+ for acute on  chronic dyspnea and chronic edema. The patient is tolerating medications without difficulties and is otherwise without complaint today.   she denies symptoms of cough, fevers, chills, or new SOB worrisome for COVID 19.     Atrial Fibrillation Risk Factors:  she does not have symptoms or diagnosis of sleep apnea. she does not have a history of rheumatic fever. she does not have a history of alcohol use. The patient does not have a history of early familial atrial fibrillation or other arrhythmias.  she has a BMI of Body mass index is 41.54 kg/m.Marland Kitchen Filed Weights   12/31/18 0932  Weight: 109.8 kg    Past Medical History:  Diagnosis Date  . Anginal pain ([place])    occ; non-ischemic Lexiscan 09/2012  . Arthritis   . Asthma   . Atrial flutter ([place])    ablated by Dr Lovena Le in 2008  . CKD (chronic kidney disease) 04/2007   CKD stage 4(Dr. Erling Cruz)  . Complication of anesthesia     DIFFICULTY BREATHING   . Coronary atherosclerosis of native coronary artery   . Diastolic heart failure 04/320   grade 2 diastolic dysfunction per 10/2480 echo  . DNI (do not intubate)   . DNR (do not resuscitate)   . Esophageal dysmotility 2008   noted on esophagram.  hx dysphagia.   . Fatty liver 2008   noted on ultrasound 2008  . Fatty tumor fatty tumor back  . GERD (gastroesophageal reflux disease) 2012   Barrets esophagus on bx 2012 and 2014.   Marland Kitchen Gouty  arthropathy   . Heart murmur   . Hyperlipidemia   . Hypertension   . 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   . Morbid obesity ([place])   . Myocardial infarction ([place]) 2009  . Peripheral vascular disease ([place])   . Persistent atrial fibrillation    chads2 vasc score of at least 5  . Sick sinus syndrome ([place])   . Sleep apnea    wears CPAP   Past Surgical History:  Procedure Laterality Date  . ATRIAL FLUTTER ABLATION  2008   CTI ablation by Dr Lovena Le  . BREAST LUMPECTOMY     right  . CARDIOVERSION N/A 03/03/2014    Procedure: CARDIOVERSION;  Surgeon: Laverda Page, MD;  Location: [facility];  Service: Cardiovascular;  Laterality: N/A;  . CARDIOVERSION N/A 03/30/2017   Procedure: CARDIOVERSION;  Surgeon: Larey Dresser, MD;  Location: [facility] ENDOSCOPY;  Service: Cardiovascular;  Laterality: N/A;  . COLONOSCOPY WITH PROPOFOL N/A 02/18/2015   Procedure: COLONOSCOPY WITH PROPOFOL;  Surgeon: Milus Banister, MD;  Location: [place];  Service: Endoscopy;  Laterality: N/A;  . CORONARY ANGIOPLASTY WITH STENT PLACEMENT    . PACEMAKER INSERTION  06/18/14   STJ Assurity dual chamber pacemaker implanted by Dr Rayann Heman  . PERMANENT PACEMAKER INSERTION N/A 06/18/2014   SJM Assurity DR pacemaker implanted by Dr Rayann Heman for sick sinus syndrome  . RIGHT HEART CATHETERIZATION N/A 06/12/2014   Procedure: RIGHT HEART CATH;  Surgeon: Jolaine Artist, MD;  [facility] LAB;  Service: Cardiovascular;  Laterality: N/A;  . TONSILLECTOMY    . TOTAL KNEE ARTHROPLASTY Right 12/15/2013   Procedure: RIGHT TOTAL KNEE ARTHROPLASTY;  Surgeon: Alta Corning, MD;  Location: [facility];  Service: Orthopedics;  Laterality: Right;  . TUBAL LIGATION    . VIDEO BRONCHOSCOPY Bilateral 12/23/2015   Procedure: VIDEO BRONCHOSCOPY WITH FLUORO;  Surgeon: Chesley Mires, MD;  Location: [facility] ENDOSCOPY;  Service: Cardiopulmonary;  Laterality: Bilateral;     Current Outpatient Medications  Medication Sig Dispense Refill  . acetaminophen (TYLENOL) 500 MG tablet Take 500-1,000 mg by mouth every 6 (six) hours as needed for mild pain or headache.    . albuterol (PROVENTIL) (2.5 MG/3ML) 0.083% nebulizer solution Take 3 mLs (2.5 mg total) by nebulization every 6 (six) hours as needed for shortness of breath. 75 mL 3  . amiodarone (PACERONE) 200 MG tablet Take 1 tablet (200 mg total) by mouth daily. 90 tablet 0  . amitriptyline (ELAVIL) 25 MG tablet Take 25 mg by mouth at bedtime.      Marland Kitchen apixaban (ELIQUIS) 2.5 MG TABS tablet Take 1 tablet (2.5 mg total) by  mouth 2 (two) times daily. 60 tablet 3  . atorvastatin (LIPITOR) 40 MG tablet TAKE 1 TABLET BY MOUTH ONCE DAILY 90 tablet 0  . CARTIA XT 180 MG 24 hr capsule TAKE 1 CAPSULE BY MOUTH ONCE DAILY (Patient taking differently: Take 180 mg by mouth daily. ) 90 capsule 1  . dicyclomine (BENTYL) 20 MG tablet Take 20 mg by mouth daily at 12 noon.    Marland Kitchen esomeprazole (NEXIUM) 40 MG capsule TAKE 1 CAPSULE BY MOUTH TWICE DAILY BEFORE MEAL(S) (Patient taking differently: Take 40 mg by mouth daily before breakfast. ) 60 capsule 1  . hydrALAZINE (APRESOLINE) 25 MG tablet TAKE 1 TABLET BY MOUTH THREE TIMES DAILY (Patient taking differently: Take 25 mg by mouth 3 (three) times daily. ) 270 tablet 1  . insulin glargine (LANTUS) 100 UNIT/ML injection Inject  60 Units into the skin daily after breakfast.     . levothyroxine (SYNTHROID, LEVOTHROID) 25 MCG tablet Take 25 mcg by mouth daily before lunch.     Marland Kitchen LINZESS 290 MCG CAPS capsule Take 290 mcg by mouth daily before breakfast.     . metolazone (ZAROXOLYN) 2.5 MG tablet Take 1 tablet (2.5 mg total) by mouth 2 (two) times a week. Tuesday and Friday (Patient taking differently: Take 2.5 mg by mouth 2 (two) times a week. TUESDAYS and SATURDAYS) 8 tablet 6  . metoprolol tartrate (LOPRESSOR) 25 MG tablet TAKE 3 TABLETS BY MOUTH TWICE DAILY 180 tablet 5  . NOVOLOG FLEXPEN 100 UNIT/ML FlexPen Inject 40 Units into the skin 3 (three) times daily as needed (for a BGL of 200 or greater).     Marland Kitchen oxyCODONE (OXY IR/ROXICODONE) 5 MG immediate release tablet Take 0.5-1 tablets (2.5-5 mg total) by mouth every 4 (four) hours as needed (pain or shortness of breath). 10 tablet 0  . OXYGEN Inhale 5 L into the lungs continuous.     . potassium chloride SA (K-DUR,KLOR-CON) 20 MEQ tablet Take 2 tablets (40 mEq total) by mouth 2 (two) times daily.    Marland Kitchen RELION PEN NEEDLES 32G X 4 MM MISC     . torsemide (DEMADEX) 100 MG tablet Take 100 mg by mouth 2 (two) times daily.    Marland Kitchen ULORIC 80 MG TABS Take  80 mg by mouth daily.     . Vitamin D, Ergocalciferol, (DRISDOL) 50000 UNITS CAPS capsule Take 50,000 Units by mouth every Monday.    [place] HFA 45 MCG/ACT inhaler Inhale 2 puffs into the lungs every 4 (four) hours as needed for wheezing or shortness of breath. 1 Inhaler 1  . zolpidem (AMBIEN) 10 MG tablet Take 10 mg by mouth at bedtime.     . nitroGLYCERIN (NITROSTAT) 0.4 MG SL tablet Place 0.4 mg under the tongue every 5 (five) minutes x 3 doses as needed for chest pain.      No current facility-administered medications for this encounter.     Allergies:   Imdur [isosorbide dinitrate]; Adhesive [tape]; Sulfa antibiotics; Gabapentin; Latex; Lyrica [pregabalin]; Codeine; and Penicillins   Social History:  The patient  reports that she quit smoking about 23 years ago. Her smoking use included cigarettes. She has a 17.50 pack-year smoking history. She has never used smokeless tobacco. She reports that she does not drink alcohol or use drugs.   Family History:  The patient's  family history includes Bladder Cancer in her mother; Colon cancer in her maternal aunt; Heart disease in her mother; Kidney disease in her mother; Ovarian cancer in her daughter; Stomach cancer in her maternal uncle.    ROS:  Please see the history of present illness.   All other systems are personally reviewed and negative.   Exam: NA, telephone visit  Recent Labs: 09/13/2018: ALT 16; TSH 2.797 09/14/2018: Hemoglobin 11.8; Platelets 171 09/15/2018: B Natriuretic Peptide 149.7 09/16/2018: BUN 86; Creatinine, Ser 9.18; Potassium 2.8; Sodium 139  personally reviewed    Other studies personally reviewed: Device strip rviewed Epic records reviewed    ASSESSMENT AND PLAN:  1.  Persistent atrial fibrillation Now x 2 days with controlled v rates  She has a soft BP and with controlled v rates do not see a need to increase rate control I will try to increase amiodarone 200 mg bid thru Sunday pm and then get pt to   send another report to device  clinic on Monday to see if this  restored SR.  I will check with PharmD to make sure on appropriate does of eliquis 2.5 mg bid from 5 mg bid in last few months, in light of her renal failure  2. CHF Weight appears stable if not down by 2 lbs since end of last week Continue daily weights Avoid salt  Continure daily diuretics    This patients CHA2DS2-VASc Score and unadjusted Ischemic Stroke Rate (% per year) is equal to 4.8 % stroke rate/year from a score of 4  Above score calculated as 1 point each if present [CHF, HTN, DM, Vascular=MI/PAD/Aortic Plaque, Age if 65-74, or Female] Above score calculated as 2 points each if present [Age > 75, or Stroke/TIA/TE]   COVID screen The patient does not have any symptoms that suggest any further testing/ screening at this time.  Social distancing reinforced today.  Follow-up: per device report on Monday If condition worsens to ER  Current medicines are reviewed at length with the patient today.   The patient does not have concerns regarding her medicines.  The following changes were made today:  none  Labs/ tests ordered today include: none No orders of the defined types were placed in this encounter.   Patient Risk:  after full review of this patients clinical status, I feel that they are at  very high risk for decompensation  at this time.   Today, I have spent 25 minutes with the patient with telehealth technology discussing afib and med change  Signed, Roderic Palau NP  12/31/2018 10:28 AM  Afib [facility] [address] [place], [place] [address] [phone]   I hereby voluntarily request, consent and authorize the [facility] and its employed or contracted physicians, physician assistants, nurse practitioners or other licensed health care professionals (the Practitioner), to provide me with telemedicine health care services (the "Services") as deemed necessary by  the treating Practitioner. I acknowledge and consent to receive the Services by the Practitioner via telemedicine. I understand that the telemedicine visit will involve communicating with the Practitioner through live audiovisual communication technology and the disclosure of certain medical information by electronic transmission. I acknowledge that I have been given the opportunity to request an in-person assessment or other available alternative prior to the telemedicine visit and am voluntarily participating in the telemedicine visit.   I understand that I have the right to withhold or withdraw my consent to the use of telemedicine in the course of my care at any time, without affecting my right to future care or treatment, and that the Practitioner or I may terminate the telemedicine visit at any time. I understand that I have the right to inspect all information obtained and/or recorded in the course of the telemedicine visit and may receive copies of available information for a reasonable fee.  I understand that some of the potential risks of receiving the Services via telemedicine include:   Delay or interruption in medical evaluation due to technological equipment failure or disruption;  Information transmitted may not be sufficient (e.g. poor resolution of images) to allow for appropriate medical decision making by the Practitioner; and/or  In rare instances, security protocols could fail, causing a breach of personal health information.   Furthermore, I acknowledge that it is my responsibility to provide information about my medical history, conditions and care that is complete and accurate to the best of my ability. I acknowledge that Practitioner's advice, recommendations, and/or decision may be based  on factors not within their control, such as incomplete or inaccurate data provided by me or distortions of diagnostic images or specimens that may result from electronic transmissions. I understand  that the practice of medicine is not an exact science and that Practitioner makes no warranties or guarantees regarding treatment outcomes. I acknowledge that I will receive a copy of this consent concurrently upon execution via email to the email address I last provided but may also request a printed copy by calling the office of the [facility].  I understand that my insurance will be billed for this visit.   I have read or had this consent read to me.  I understand the contents of this consent, which adequately explains the benefits and risks of the Services being provided via telemedicine.  I have been provided ample opportunity to ask questions regarding this consent and the Services and have had my questions answered to my satisfaction.  I give my informed consent for the services to be provided through the use of telemedicine in my medical care  By participating in this telemedicine visit I agree to the above.

## 2018-12-31 NOTE — Progress Notes (Signed)
I suspect she will be significantly more symptomatic in afib.  If she does not convert to NSR on her own, set her up for DCCV with me please.

## 2019-01-01 ENCOUNTER — Other Ambulatory Visit: Payer: Self-pay

## 2019-01-01 NOTE — Patient Outreach (Signed)
  [employer] [facility] [facility]) Care Management Chronic Special Needs Program    01/01/2019  Name: [name], DOB: October 19, 1945  MRN: [id number]   Ms. [name] is enrolled in a chronic special needs plan. RNCM called [facility] for follow up- Left message requesting return call for her nurse Wilma Flavin.  Plan: await return call/continue to follow.  Thea Silversmith, RN, MSN, [place] [phone]

## 2019-01-01 NOTE — Patient Outreach (Signed)
  [facility]) Care Management Chronic Special Needs Program    01/01/2019  Name: ANNAKA CLEAVER, DOB: 07-Apr-1946  MRN: [id number]   Ms. Saniah Schroeter is enrolled in a chronic special needs plan. RNCM received return call from client's nurse at [facility], Vara Guardian, who reports she continues to follow client. She states client was not feeling well last week. She reports cardiologist called client regarding atrial fibrillation being out of control. She states medications have been adjusted and cardiology continues to follow. Ms. Higinio Plan reports she continues to follow up with client at least weekly. Ms. Higinio Plan to continue to notify RNCM at least quarterly or with any significant changes.  Plan: continue to follow.  Thea Silversmith, RN, MSN, [place] [phone]

## 2019-01-06 ENCOUNTER — Ambulatory Visit (INDEPENDENT_AMBULATORY_CARE_PROVIDER_SITE_OTHER): Payer: HMO | Admitting: *Deleted

## 2019-01-06 ENCOUNTER — Other Ambulatory Visit: Payer: Self-pay

## 2019-01-06 DIAGNOSIS — I4891 Unspecified atrial fibrillation: Secondary | ICD-10-CM

## 2019-01-07 ENCOUNTER — Ambulatory Visit (HOSPITAL_COMMUNITY)
Admission: RE | Admit: 2019-01-07 | Discharge: 2019-01-07 | Disposition: A | Discharge status: Home / Self Care | Payer: HMO | Source: Ambulatory Visit | Attending: Nurse Practitioner | Admitting: Nurse Practitioner

## 2019-01-07 ENCOUNTER — Telehealth: Payer: Self-pay

## 2019-01-07 ENCOUNTER — Other Ambulatory Visit: Payer: Self-pay

## 2019-01-07 ENCOUNTER — Encounter (HOSPITAL_COMMUNITY): Payer: Self-pay | Admitting: Nurse Practitioner

## 2019-01-07 DIAGNOSIS — I4819 Other persistent atrial fibrillation: Secondary | ICD-10-CM | POA: Diagnosis not present

## 2019-01-07 LAB — CUP PACEART REMOTE DEVICE CHECK
Date Time Interrogation Session: 20200505115731
Implantable Lead Implant Date: 20151015
Implantable Lead Implant Date: 20151015
Implantable Lead Location: 753859
Implantable Lead Location: 753860
Implantable Lead Model: 1948
Implantable Pulse Generator Implant Date: 20151015
Pulse Gen Model: 2240
Pulse Gen Serial Number: 7665001

## 2019-01-07 NOTE — Telephone Encounter (Signed)
Spoke with patient to remind of missed remote transmission 

## 2019-01-08 ENCOUNTER — Other Ambulatory Visit (HOSPITAL_COMMUNITY): Payer: Self-pay | Admitting: *Deleted

## 2019-01-08 MED ORDER — APIXABAN 5 MG PO TABS: 5.0000 mg | ORAL_TABLET | Freq: Two times a day (BID) | ORAL | 3 refills | Status: DC

## 2019-01-08 NOTE — H&P (View-Only) (Signed)
Electrophysiology TeleHealth Note   Due to national recommendations of social distancing due to [place] 19, Audio/video telehealth visit is felt to be most appropriate for this patient at this time.  See MyChart message/consent below from today for patient consent regarding telehealth for the [facility].    Date:  01/08/2019   ID:  [name], DOB February 16, 1946, MRN [id number]  Location: home  Provider location: [address] [place], [place] [address] Evaluation Performed: Follow up afib x 2 days per device clinic  PCP:  Merrilee Seashore, MD  Primary Cardiologist:   Dr. Aundra Dubin Primary Electrophysiologist: Dr. Rayann Heman  JK:[id number] afib since 4/26   History of Present Illness: [name] is a 73 y.o. female who presents via audio/video conferencing for a telehealth visit today.   The patient is referred for afib on device sine 4/26.   Pt seen 4/41for persisitent afib for several days. Was already on amiodarone for PAF for some time and has a very complicated history  chronic kidney disease stage IV, sleep apnea, interstitial lung disease, CAD, A. fib, hypothyroidism, hypertension, diastolic CHF and has been in Hospice since January 2020. Pt has chosen not to pursue dialysis and her creatinine by last lab 09/16/18 was 9.13. Pt knew of no change in meds or overall condition. She has noted increased shortness of breath since being in afib, her weight is actually down 2 lbs from Sunday when it was recorded at 242 lbs and 239 lbs today. Her BP was  soft at 998 systolic and her HR's were running in the 80's, on CCB/BB daily.She has chronic LLE and abdominal swelling. Daughter states that her eliquis was reduced to 2.5 mg bid by a doctor, unsure which one, after her hospitalization in January.  F/u visit, 5/5. I increased amiodarone to 200 mg bid on the last visit and pt sent in a report to device clinc which showed persistent afib but with controlled v rates.The pt reports  that she is feeling better this week, not as short of breath. When I discussed with Dr. Aundra Dubin last week, he felt that she may be a good candidate for cardioversion as he felt that she may decline if left in afib. I questioned what should be her correct dose per her assessment/research, and despite her significant  kidney function, she felt her correct dose based on the literature should be 5 mg bid.   Today, she denies symptoms of palpitations, chest pain, , orthopnea, PND, lower extremity edema, claudication, dizziness, presyncope, syncope, bleeding, or neurologic sequela.+ for acute on chronic dyspnea and chronic edema. The patient is tolerating medications without difficulties and is otherwise without complaint today.   she denies symptoms of cough, fevers, chills, or new SOB worrisome for COVID 19.     Atrial Fibrillation Risk Factors:  she does not have symptoms or diagnosis of sleep apnea. she does not have a history of rheumatic fever. she does not have a history of alcohol use. The patient does not have a history of early familial atrial fibrillation or other arrhythmias.  she has a BMI of Body mass index is 41.02 kg/m.Marland Kitchen Filed Weights   01/07/19 1503  Weight: 108.4 kg    Past Medical History:  Diagnosis Date   Anginal pain ([place])    occ; non-ischemic Lexiscan 09/2012   Arthritis    Asthma    Atrial flutter ([place])    ablated by Dr Lovena Le in 2008   CKD (chronic kidney disease) 04/2007  CKD stage 4(Dr. Erling Cruz)   Complication of anesthesia     DIFFICULTY BREATHING    Coronary atherosclerosis of native coronary artery    Diastolic heart failure 04/7680   grade 2 diastolic dysfunction per 09/5724 echo   DNI (do not intubate)    DNR (do not resuscitate)    Esophageal dysmotility 2008   noted on esophagram.  hx dysphagia.    Fatty liver 2008   noted on ultrasound 2008   Fatty tumor fatty tumor back   GERD (gastroesophageal reflux disease) 2012   Barrets  esophagus on bx 2012 and 2014.    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    Morbid obesity ([place])    Myocardial infarction [facility]) 2009   Peripheral vascular disease ([place])    Persistent atrial fibrillation    chads2 vasc score of at least 5   Sick sinus syndrome ([place])    Sleep apnea    wears CPAP   Past Surgical History:  Procedure Laterality Date   ATRIAL FLUTTER ABLATION  2008   CTI ablation by Dr Lovena Le   BREAST LUMPECTOMY     right   CARDIOVERSION N/A 03/03/2014   Procedure: CARDIOVERSION;  Surgeon: Laverda Page, MD;  Location: [facility];  Service: Cardiovascular;  Laterality: N/A;   CARDIOVERSION N/A 03/30/2017   Procedure: CARDIOVERSION;  Surgeon: Larey Dresser, MD;  Location: [facility] ENDOSCOPY;  Service: Cardiovascular;  Laterality: N/A;   COLONOSCOPY WITH PROPOFOL N/A 02/18/2015   Procedure: COLONOSCOPY WITH PROPOFOL;  Surgeon: Milus Banister, MD;  Location: [facility];  Service: Endoscopy;  Laterality: N/A;   CORONARY ANGIOPLASTY WITH STENT PLACEMENT     PACEMAKER INSERTION  06/18/14   STJ Assurity dual chamber pacemaker implanted by Dr Rayann Heman   PERMANENT PACEMAKER INSERTION N/A 06/18/2014   SJM Assurity DR pacemaker implanted by Dr Rayann Heman for sick sinus syndrome   RIGHT HEART CATHETERIZATION N/A 06/12/2014   Procedure: RIGHT HEART CATH;  Surgeon: Jolaine Artist, MD;  Location[facility] CATH LAB;  Service: Cardiovascular;  Laterality: N/A;   TONSILLECTOMY     TOTAL KNEE ARTHROPLASTY Right 12/15/2013   Procedure: RIGHT TOTAL KNEE ARTHROPLASTY;  Surgeon: Alta Corning, MD;  Location: [facility];  Service: Orthopedics;  Laterality: Right;   TUBAL LIGATION     VIDEO BRONCHOSCOPY Bilateral 12/23/2015   Procedure: VIDEO BRONCHOSCOPY WITH FLUORO;  Surgeon: Chesley Mires, MD;  Location: [facility] ENDOSCOPY;  Service: Cardiopulmonary;  Laterality: Bilateral;     Current  Outpatient Medications  Medication Sig Dispense Refill   acetaminophen (TYLENOL) 500 MG tablet Take 500-1,000 mg by mouth every 6 (six) hours as needed for mild pain or headache.     albuterol (PROVENTIL) (2.5 MG/3ML) 0.083% nebulizer solution Take 3 mLs (2.5 mg total) by nebulization every 6 (six) hours as needed for shortness of breath. 75 mL 3   amiodarone (PACERONE) 200 MG tablet Take 200 mg by mouth daily.     amitriptyline (ELAVIL) 25 MG tablet Take 25 mg by mouth at bedtime.       apixaban (ELIQUIS) 2.5 MG TABS tablet Take 1 tablet (2.5 mg total) by mouth 2 (two) times daily. 60 tablet 3   atorvastatin (LIPITOR) 40 MG tablet TAKE 1 TABLET BY MOUTH ONCE DAILY 90 tablet 0   CARTIA XT 180 MG 24 hr capsule TAKE 1 CAPSULE BY MOUTH ONCE  DAILY (Patient taking differently: Take 180 mg by mouth daily. ) 90 capsule 1   dicyclomine (BENTYL) 20 MG tablet Take 20 mg by mouth daily at 12 noon.     esomeprazole (NEXIUM) 40 MG capsule TAKE 1 CAPSULE BY MOUTH TWICE DAILY BEFORE MEAL(S) (Patient taking differently: Take 40 mg by mouth daily before breakfast. ) 60 capsule 1   hydrALAZINE (APRESOLINE) 25 MG tablet TAKE 1 TABLET BY MOUTH THREE TIMES DAILY (Patient taking differently: Take 25 mg by mouth 3 (three) times daily. ) 270 tablet 1   insulin glargine (LANTUS) 100 UNIT/ML injection Inject 60 Units into the skin daily after breakfast.      levothyroxine (SYNTHROID, LEVOTHROID) 25 MCG tablet Take 25 mcg by mouth daily before lunch.      LINZESS 290 MCG CAPS capsule Take 290 mcg by mouth daily before breakfast.      metolazone (ZAROXOLYN) 2.5 MG tablet Take 1 tablet (2.5 mg total) by mouth 2 (two) times a week. Tuesday and Friday (Patient taking differently: Take 2.5 mg by mouth 2 (two) times a week. TUESDAYS and SATURDAYS) 8 tablet 6   metoprolol tartrate (LOPRESSOR) 25 MG tablet TAKE 3 TABLETS BY MOUTH TWICE DAILY 180 tablet 5   nitroGLYCERIN (NITROSTAT) 0.4 MG SL tablet Place 0.4 mg under  the tongue every 5 (five) minutes x 3 doses as needed for chest pain.      NOVOLOG FLEXPEN 100 UNIT/ML FlexPen Inject 40 Units into the skin 3 (three) times daily as needed (for a BGL of 200 or greater).      oxyCODONE (OXY IR/ROXICODONE) 5 MG immediate release tablet Take 0.5-1 tablets (2.5-5 mg total) by mouth every 4 (four) hours as needed (pain or shortness of breath). 10 tablet 0   OXYGEN Inhale 5 L into the lungs continuous.      potassium chloride SA (K-DUR,KLOR-CON) 20 MEQ tablet Take 2 tablets (40 mEq total) by mouth 2 (two) times daily.     RELION PEN NEEDLES 32G X 4 MM MISC      torsemide (DEMADEX) 100 MG tablet Take 100 mg by mouth 2 (two) times daily.     ULORIC 80 MG TABS Take 80 mg by mouth daily.      Vitamin D, Ergocalciferol, (DRISDOL) 50000 UNITS CAPS capsule Take 50,000 Units by mouth every Monday.     XOPENEX HFA 45 MCG/ACT inhaler Inhale 2 puffs into the lungs every 4 (four) hours as needed for wheezing or shortness of breath. 1 Inhaler 1   zolpidem (AMBIEN) 10 MG tablet Take 10 mg by mouth at bedtime.      No current facility-administered medications for this encounter.     Allergies:   Imdur [isosorbide dinitrate]; Adhesive [tape]; Sulfa antibiotics; Gabapentin; Latex; Lyrica [pregabalin]; Codeine; and Penicillins   Social History:  The patient  reports that she quit smoking about 23 years ago. Her smoking use included cigarettes. She has a 17.50 pack-year smoking history. She has never used smokeless tobacco. She reports that she does not drink alcohol or use drugs.   Family History:  The patient's  family history includes Bladder Cancer in her mother; Colon cancer in her maternal aunt; Heart disease in her mother; Kidney disease in her mother; Ovarian cancer in her daughter; Stomach cancer in her maternal uncle.    ROS:  Please see the history of present illness.   All other systems are personally reviewed and negative.   Exam: NA, telephone  visit  Recent Labs: 09/13/2018:  ALT 16; TSH 2.797 09/14/2018: Hemoglobin 11.8; Platelets 171 09/15/2018: B Natriuretic Peptide 149.7 09/16/2018: BUN 86; Creatinine, Ser 9.18; Potassium 2.8; Sodium 139  personally reviewed    Other studies personally reviewed: Device strip rviewed Epic records reviewed    ASSESSMENT AND PLAN:  1.  Persistent atrial fibrillation Last device report sent Tuesday shows that pt continues in afib with controlled v rates She has a soft BP and with controlled v rates do not see a need to increase rate control I discussed with Dr. Aundra Dubin and he feels she would be at high risk of decompensation if left in afib and would like to scheduled for urgent cardiovertion Continue amiodarone 200 mg bid and reduce back to 200 mg qd one week after cardioversion    2. CHA2DS2VASc score of at least 4 I checked with Fuller Canada, PharmD and she feels that pt should be back on 5 mg bid by her literature search I discussed with Dr. Aundra Dubin proper dose of anticoagulation and   minimizing stroke risk with DCCV Her dose will be increased back to 5 mg bid and DCCV will be TEE guided scheduled for next week She will need a istat am of cardioversion  2. CHF Weight appears stable    Continue daily weights Avoid salt  Continure daily diuretics    This patients CHA2DS2-VASc Score and unadjusted Ischemic Stroke Rate (% per year) is equal to 4.8 % stroke rate/year from a score of 4  Above score calculated as 1 point each if present [CHF, HTN, DM, Vascular=MI/PAD/Aortic Plaque, Age if 65-74, or Female] Above score calculated as 2 points each if present [Age > 75, or Stroke/TIA/TE]   COVID screen The patient does not have any symptoms that suggest any further testing/ screening at this time.  Social distancing reinforced today.  Follow-up: per device report on Monday If condition worsens to ER  Current medicines are reviewed at length with the patient today.   The patient does  not have concerns regarding her medicines.  The following changes were made today:  none  Labs/ tests ordered today include: none No orders of the defined types were placed in this encounter.   Patient Risk:  after full review of this patients clinical status, I feel that they are at  very high risk for decompensation  at this time.   Today, I have spent 15 minutes with the patient with telehealth technology discussing afib and med change  Signed, Roderic Palau NP  01/08/2019 9:13 AM  Afib [facility] [address] [place],  [address] [phone]   I hereby voluntarily request, consent and authorize the [facility] and its employed or contracted physicians, physician assistants, nurse practitioners or other licensed health care professionals (the Practitioner), to provide me with telemedicine health care services (the "Services") as deemed necessary by the treating Practitioner. I acknowledge and consent to receive the Services by the Practitioner via telemedicine. I understand that the telemedicine visit will involve communicating with the Practitioner through live audiovisual communication technology and the disclosure of certain medical information by electronic transmission. I acknowledge that I have been given the opportunity to request an in-person assessment or other available alternative prior to the telemedicine visit and am voluntarily participating in the telemedicine visit.   I understand that I have the right to withhold or withdraw my consent to the use of telemedicine in the course of my care at any time, without affecting my right to future care or  treatment, and that the Practitioner or I may terminate the telemedicine visit at any time. I understand that I have the right to inspect all information obtained and/or recorded in the course of the telemedicine visit and may receive copies of available information for a reasonable fee.  I  understand that some of the potential risks of receiving the Services via telemedicine include:   Delay or interruption in medical evaluation due to technological equipment failure or disruption;  Information transmitted may not be sufficient (e.g. poor resolution of images) to allow for appropriate medical decision making by the Practitioner; and/or  In rare instances, security protocols could fail, causing a breach of personal health information.   Furthermore, I acknowledge that it is my responsibility to provide information about my medical history, conditions and care that is complete and accurate to the best of my ability. I acknowledge that Practitioner's advice, recommendations, and/or decision may be based on factors not within their control, such as incomplete or inaccurate data provided by me or distortions of diagnostic images or specimens that may result from electronic transmissions. I understand that the practice of medicine is not an exact science and that Practitioner makes no warranties or guarantees regarding treatment outcomes. I acknowledge that I will receive a copy of this consent concurrently upon execution via email to the email address I last provided but may also request a printed copy by calling the office of the [facility].  I understand that my insurance will be billed for this visit.   I have read or had this consent read to me.  I understand the contents of this consent, which adequately explains the benefits and risks of the Services being provided via telemedicine.  I have been provided ample opportunity to ask questions regarding this consent and the Services and have had my questions answered to my satisfaction.  I give my informed consent for the services to be provided through the use of telemedicine in my medical care  By participating in this telemedicine visit I agree to the above.

## 2019-01-08 NOTE — Progress Notes (Signed)
Electrophysiology TeleHealth Note   Due to national recommendations of social distancing due to [place] 19, Audio/video telehealth visit is felt to be most appropriate for this patient at this time.  See MyChart message/consent below from today for patient consent regarding telehealth for the [facility].    Date:  01/08/2019   ID:  [name], DOB 04-Dec-1945, MRN [id number]  Location: home  Provider location: [address] [place], [place] [address] Evaluation Performed: Follow up afib x 2 days per device clinic  PCP:  Merrilee Seashore, MD  Primary Cardiologist:   Dr. Aundra Dubin Primary Electrophysiologist: Dr. Rayann Heman  OI:[id number] afib since 4/26   History of Present Illness: [name] is a 73 y.o. female who presents via audio/video conferencing for a telehealth visit today.   The patient is referred for afib on device sine 4/26.   Pt seen 4/37for persisitent afib for several days. Was already on amiodarone for PAF for some time and has a very complicated history  chronic kidney disease stage IV, sleep apnea, interstitial lung disease, CAD, A. fib, hypothyroidism, hypertension, diastolic CHF and has been in Hospice since January 2020. Pt has chosen not to pursue dialysis and her creatinine by last lab 09/16/18 was 9.13. Pt knew of no change in meds or overall condition. She has noted increased shortness of breath since being in afib, her weight is actually down 2 lbs from Sunday when it was recorded at 242 lbs and 239 lbs today. Her BP was  soft at 096 systolic and her HR's were running in the 80's, on CCB/BB daily.She has chronic LLE and abdominal swelling. Daughter states that her eliquis was reduced to 2.5 mg bid by a doctor, unsure which one, after her hospitalization in January.  F/u visit, 5/5. I increased amiodarone to 200 mg bid on the last visit and pt sent in a report to device clinc which showed persistent afib but with controlled v rates.The pt reports  that she is feeling better this week, not as short of breath. When I discussed with Dr. Aundra Dubin last week, he felt that she may be a good candidate for cardioversion as he felt that she may decline if left in afib. I questioned what should be her correct dose per her assessment/research, and despite her significant  kidney function, she felt her correct dose based on the literature should be 5 mg bid.   Today, she denies symptoms of palpitations, chest pain, , orthopnea, PND, lower extremity edema, claudication, dizziness, presyncope, syncope, bleeding, or neurologic sequela.+ for acute on chronic dyspnea and chronic edema. The patient is tolerating medications without difficulties and is otherwise without complaint today.   she denies symptoms of cough, fevers, chills, or new SOB worrisome for COVID 19.     Atrial Fibrillation Risk Factors:  she does not have symptoms or diagnosis of sleep apnea. she does not have a history of rheumatic fever. she does not have a history of alcohol use. The patient does not have a history of early familial atrial fibrillation or other arrhythmias.  she has a BMI of Body mass index is 41.02 kg/m.Marland Kitchen Filed Weights   01/07/19 1503  Weight: 108.4 kg    Past Medical History:  Diagnosis Date   Anginal pain ([place])    occ; non-ischemic Lexiscan 09/2012   Arthritis    Asthma    Atrial flutter ([place])    ablated by Dr Lovena Le in 2008   CKD (chronic kidney disease) 04/2007  CKD stage 4(Dr. Erling Cruz)   Complication of anesthesia     DIFFICULTY BREATHING    Coronary atherosclerosis of native coronary artery    Diastolic heart failure 05/8337   grade 2 diastolic dysfunction per 10/5051 echo   DNI (do not intubate)    DNR (do not resuscitate)    Esophageal dysmotility 2008   noted on esophagram.  hx dysphagia.    Fatty liver 2008   noted on ultrasound 2008   Fatty tumor fatty tumor back   GERD (gastroesophageal reflux disease) 2012   Barrets  esophagus on bx 2012 and 2014.    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    Morbid obesity ([place])    Myocardial infarction [facility]) 2009   Peripheral vascular disease ([place])    Persistent atrial fibrillation    chads2 vasc score of at least 5   Sick sinus syndrome ([place])    Sleep apnea    wears CPAP   Past Surgical History:  Procedure Laterality Date   ATRIAL FLUTTER ABLATION  2008   CTI ablation by Dr Lovena Le   BREAST LUMPECTOMY     right   CARDIOVERSION N/A 03/03/2014   Procedure: CARDIOVERSION;  Surgeon: Laverda Page, MD;  Location: [place];  Service: Cardiovascular;  Laterality: N/A;   CARDIOVERSION N/A 03/30/2017   Procedure: CARDIOVERSION;  Surgeon: Larey Dresser, MD;  Location: [facility] ENDOSCOPY;  Service: Cardiovascular;  Laterality: N/A;   COLONOSCOPY WITH PROPOFOL N/A 02/18/2015   Procedure: COLONOSCOPY WITH PROPOFOL;  Surgeon: Milus Banister, MD;  Location: [place];  Service: Endoscopy;  Laterality: N/A;   CORONARY ANGIOPLASTY WITH STENT PLACEMENT     PACEMAKER INSERTION  06/18/14   STJ Assurity dual chamber pacemaker implanted by Dr Rayann Heman   PERMANENT PACEMAKER INSERTION N/A 06/18/2014   SJM Assurity DR pacemaker implanted by Dr Rayann Heman for sick sinus syndrome   RIGHT HEART CATHETERIZATION N/A 06/12/2014   Procedure: RIGHT HEART CATH;  Surgeon: Jolaine Artist, MD;  Location: [facility] CATH LAB;  Service: Cardiovascular;  Laterality: N/A;   TONSILLECTOMY     TOTAL KNEE ARTHROPLASTY Right 12/15/2013   Procedure: RIGHT TOTAL KNEE ARTHROPLASTY;  Surgeon: Alta Corning, MD;  Location: [place];  Service: Orthopedics;  Laterality: Right;   TUBAL LIGATION     VIDEO BRONCHOSCOPY Bilateral 12/23/2015   Procedure: VIDEO BRONCHOSCOPY WITH FLUORO;  Surgeon: Chesley Mires, MD;  Location: [facility] ENDOSCOPY;  Service: Cardiopulmonary;  Laterality: Bilateral;     Current  Outpatient Medications  Medication Sig Dispense Refill   acetaminophen (TYLENOL) 500 MG tablet Take 500-1,000 mg by mouth every 6 (six) hours as needed for mild pain or headache.     albuterol (PROVENTIL) (2.5 MG/3ML) 0.083% nebulizer solution Take 3 mLs (2.5 mg total) by nebulization every 6 (six) hours as needed for shortness of breath. 75 mL 3   amiodarone (PACERONE) 200 MG tablet Take 200 mg by mouth daily.     amitriptyline (ELAVIL) 25 MG tablet Take 25 mg by mouth at bedtime.       apixaban (ELIQUIS) 2.5 MG TABS tablet Take 1 tablet (2.5 mg total) by mouth 2 (two) times daily. 60 tablet 3   atorvastatin (LIPITOR) 40 MG tablet TAKE 1 TABLET BY MOUTH ONCE DAILY 90 tablet 0   CARTIA XT 180 MG 24 hr capsule TAKE 1 CAPSULE BY MOUTH ONCE  DAILY (Patient taking differently: Take 180 mg by mouth daily. ) 90 capsule 1   dicyclomine (BENTYL) 20 MG tablet Take 20 mg by mouth daily at 12 noon.     esomeprazole (NEXIUM) 40 MG capsule TAKE 1 CAPSULE BY MOUTH TWICE DAILY BEFORE MEAL(S) (Patient taking differently: Take 40 mg by mouth daily before breakfast. ) 60 capsule 1   hydrALAZINE (APRESOLINE) 25 MG tablet TAKE 1 TABLET BY MOUTH THREE TIMES DAILY (Patient taking differently: Take 25 mg by mouth 3 (three) times daily. ) 270 tablet 1   insulin glargine (LANTUS) 100 UNIT/ML injection Inject 60 Units into the skin daily after breakfast.      levothyroxine (SYNTHROID, LEVOTHROID) 25 MCG tablet Take 25 mcg by mouth daily before lunch.      LINZESS 290 MCG CAPS capsule Take 290 mcg by mouth daily before breakfast.      metolazone (ZAROXOLYN) 2.5 MG tablet Take 1 tablet (2.5 mg total) by mouth 2 (two) times a week. Tuesday and Friday (Patient taking differently: Take 2.5 mg by mouth 2 (two) times a week. TUESDAYS and SATURDAYS) 8 tablet 6   metoprolol tartrate (LOPRESSOR) 25 MG tablet TAKE 3 TABLETS BY MOUTH TWICE DAILY 180 tablet 5   nitroGLYCERIN (NITROSTAT) 0.4 MG SL tablet Place 0.4 mg under  the tongue every 5 (five) minutes x 3 doses as needed for chest pain.      NOVOLOG FLEXPEN 100 UNIT/ML FlexPen Inject 40 Units into the skin 3 (three) times daily as needed (for a BGL of 200 or greater).      oxyCODONE (OXY IR/ROXICODONE) 5 MG immediate release tablet Take 0.5-1 tablets (2.5-5 mg total) by mouth every 4 (four) hours as needed (pain or shortness of breath). 10 tablet 0   OXYGEN Inhale 5 L into the lungs continuous.      potassium chloride SA (K-DUR,KLOR-CON) 20 MEQ tablet Take 2 tablets (40 mEq total) by mouth 2 (two) times daily.     RELION PEN NEEDLES 32G X 4 MM MISC      torsemide (DEMADEX) 100 MG tablet Take 100 mg by mouth 2 (two) times daily.     ULORIC 80 MG TABS Take 80 mg by mouth daily.      Vitamin D, Ergocalciferol, (DRISDOL) 50000 UNITS CAPS capsule Take 50,000 Units by mouth every Monday.     XOPENEX HFA 45 MCG/ACT inhaler Inhale 2 puffs into the lungs every 4 (four) hours as needed for wheezing or shortness of breath. 1 Inhaler 1   zolpidem (AMBIEN) 10 MG tablet Take 10 mg by mouth at bedtime.      No current facility-administered medications for this encounter.     Allergies:   Imdur [isosorbide dinitrate]; Adhesive [tape]; Sulfa antibiotics; Gabapentin; Latex; Lyrica [pregabalin]; Codeine; and Penicillins   Social History:  The patient  reports that she quit smoking about 23 years ago. Her smoking use included cigarettes. She has a 17.50 pack-year smoking history. She has never used smokeless tobacco. She reports that she does not drink alcohol or use drugs.   Family History:  The patient's  family history includes Bladder Cancer in her mother; Colon cancer in her maternal aunt; Heart disease in her mother; Kidney disease in her mother; Ovarian cancer in her daughter; Stomach cancer in her maternal uncle.    ROS:  Please see the history of present illness.   All other systems are personally reviewed and negative.   Exam: NA, telephone  visit  Recent Labs: 09/13/2018:  ALT 16; TSH 2.797 09/14/2018: Hemoglobin 11.8; Platelets 171 09/15/2018: B Natriuretic Peptide 149.7 09/16/2018: BUN 86; Creatinine, Ser 9.18; Potassium 2.8; Sodium 139  personally reviewed    Other studies personally reviewed: Device strip rviewed Epic records reviewed    ASSESSMENT AND PLAN:  1.  Persistent atrial fibrillation Last device report sent Tuesday shows that pt continues in afib with controlled v rates She has a soft BP and with controlled v rates do not see a need to increase rate control I discussed with Dr. Aundra Dubin and he feels she would be at high risk of decompensation if left in afib and would like to scheduled for urgent cardiovertion Continue amiodarone 200 mg bid and reduce back to 200 mg qd one week after cardioversion    2. CHA2DS2VASc score of at least 4 I checked with Fuller Canada, PharmD and she feels that pt should be back on 5 mg bid by her literature search I discussed with Dr. Aundra Dubin proper dose of anticoagulation and   minimizing stroke risk with DCCV Her dose will be increased back to 5 mg bid and DCCV will be TEE guided scheduled for next week She will need a istat am of cardioversion  2. CHF Weight appears stable    Continue daily weights Avoid salt  Continure daily diuretics    This patients CHA2DS2-VASc Score and unadjusted Ischemic Stroke Rate (% per year) is equal to 4.8 % stroke rate/year from a score of 4  Above score calculated as 1 point each if present [CHF, HTN, DM, Vascular=MI/PAD/Aortic Plaque, Age if 65-74, or Female] Above score calculated as 2 points each if present [Age > 75, or Stroke/TIA/TE]   COVID screen The patient does not have any symptoms that suggest any further testing/ screening at this time.  Social distancing reinforced today.  Follow-up: per device report on Monday If condition worsens to ER  Current medicines are reviewed at length with the patient today.   The patient does  not have concerns regarding her medicines.  The following changes were made today:  none  Labs/ tests ordered today include: none No orders of the defined types were placed in this encounter.   Patient Risk:  after full review of this patients clinical status, I feel that they are at  very high risk for decompensation  at this time.   Today, I have spent 15 minutes with the patient with telehealth technology discussing afib and med change  Signed, Roderic Palau NP  01/08/2019 9:13 AM  Afib [facility] [address] [place], [place] [address] [phone]   I hereby voluntarily request, consent and authorize the [facility] and its employed or contracted physicians, physician assistants, nurse practitioners or other licensed health care professionals (the Practitioner), to provide me with telemedicine health care services (the "Services") as deemed necessary by the treating Practitioner. I acknowledge and consent to receive the Services by the Practitioner via telemedicine. I understand that the telemedicine visit will involve communicating with the Practitioner through live audiovisual communication technology and the disclosure of certain medical information by electronic transmission. I acknowledge that I have been given the opportunity to request an in-person assessment or other available alternative prior to the telemedicine visit and am voluntarily participating in the telemedicine visit.   I understand that I have the right to withhold or withdraw my consent to the use of telemedicine in the course of my care at any time, without affecting my right to future care or  treatment, and that the Practitioner or I may terminate the telemedicine visit at any time. I understand that I have the right to inspect all information obtained and/or recorded in the course of the telemedicine visit and may receive copies of available information for a reasonable fee.  I  understand that some of the potential risks of receiving the Services via telemedicine include:   Delay or interruption in medical evaluation due to technological equipment failure or disruption;  Information transmitted may not be sufficient (e.g. poor resolution of images) to allow for appropriate medical decision making by the Practitioner; and/or  In rare instances, security protocols could fail, causing a breach of personal health information.   Furthermore, I acknowledge that it is my responsibility to provide information about my medical history, conditions and care that is complete and accurate to the best of my ability. I acknowledge that Practitioner's advice, recommendations, and/or decision may be based on factors not within their control, such as incomplete or inaccurate data provided by me or distortions of diagnostic images or specimens that may result from electronic transmissions. I understand that the practice of medicine is not an exact science and that Practitioner makes no warranties or guarantees regarding treatment outcomes. I acknowledge that I will receive a copy of this consent concurrently upon execution via email to the email address I last provided but may also request a printed copy by calling the office of the [facility].  I understand that my insurance will be billed for this visit.   I have read or had this consent read to me.  I understand the contents of this consent, which adequately explains the benefits and risks of the Services being provided via telemedicine.  I have been provided ample opportunity to ask questions regarding this consent and the Services and have had my questions answered to my satisfaction.  I give my informed consent for the services to be provided through the use of telemedicine in my medical care  By participating in this telemedicine visit I agree to the above.

## 2019-01-10 ENCOUNTER — Telehealth (HOSPITAL_COMMUNITY): Payer: Self-pay | Admitting: *Deleted

## 2019-01-10 ENCOUNTER — Other Ambulatory Visit (HOSPITAL_COMMUNITY): Payer: Self-pay | Admitting: *Deleted

## 2019-01-10 DIAGNOSIS — I4819 Other persistent atrial fibrillation: Secondary | ICD-10-CM

## 2019-01-10 NOTE — Telephone Encounter (Signed)
Patient is scheduled for TEE/DCCV with Dr. Aundra Dubin on 5/14 at 1030am. Pt will need ISTAT am of procedure. She is unable to get out for labs prior to day of cardioversion. Pt to be NPO after MN - sip of water with morning medications except diabetes medication. Pt stated PAT was working on how to do her covid screening since pt unable to leave home prior to cardioversion on Thursday. Pt states she has not left her house since January.  Follow up appt made.

## 2019-01-13 ENCOUNTER — Encounter: Payer: Self-pay | Admitting: Cardiology

## 2019-01-13 NOTE — Progress Notes (Signed)
Remote pacemaker transmission.   

## 2019-01-13 NOTE — Addendum Note (Signed)
Addended by: Tiajuana Amass on: 01/13/2019 07:09 AM   Modules accepted: Level of Service

## 2019-01-13 NOTE — Progress Notes (Signed)
Call placed to patient daughter Constance Haw to discuss plan for COVID testing prior to 01/16/2019 TEE/cardioversion with Dr. Aundra Dubin. Patient and daughter agree to go to [facility] testing 01/16/2019 at 0800 and will have rapid test performed. They will wait in vehicle at [facility] parking lot for result prior to coming to admitting. Requests call to either patient cell number in Epic or daughter Demetrius Charity mobile number in epic day of.

## 2019-01-16 ENCOUNTER — Ambulatory Visit (HOSPITAL_COMMUNITY): Payer: Medicare Other | Admitting: Registered Nurse

## 2019-01-16 ENCOUNTER — Ambulatory Visit (HOSPITAL_BASED_OUTPATIENT_CLINIC_OR_DEPARTMENT_OTHER)
Admission: RE | Admit: 2019-01-16 | Discharge: 2019-01-16 | Disposition: A | Discharge status: Home / Self Care | Payer: Medicare Other | Source: Ambulatory Visit | Attending: Nurse Practitioner | Admitting: Nurse Practitioner

## 2019-01-16 ENCOUNTER — Encounter (HOSPITAL_COMMUNITY)
Admission: RE | Disposition: A | Discharge status: Home / Self Care | Payer: Self-pay | Source: Home / Self Care | Attending: Cardiology

## 2019-01-16 ENCOUNTER — Ambulatory Visit (HOSPITAL_COMMUNITY)
Admission: RE | Admit: 2019-01-16 | Discharge: 2019-01-16 | Disposition: A | Discharge status: Home / Self Care | Payer: Medicare Other | Attending: Cardiology | Admitting: Cardiology

## 2019-01-16 ENCOUNTER — Other Ambulatory Visit (HOSPITAL_COMMUNITY)
Admission: RE | Admit: 2019-01-16 | Discharge: 2019-01-16 | Disposition: A | Discharge status: Home / Self Care | Payer: Medicare Other | Source: Ambulatory Visit | Attending: Cardiology | Admitting: Cardiology

## 2019-01-16 ENCOUNTER — Other Ambulatory Visit (HOSPITAL_COMMUNITY): Payer: Self-pay | Admitting: Cardiology

## 2019-01-16 ENCOUNTER — Other Ambulatory Visit: Payer: Self-pay

## 2019-01-16 DIAGNOSIS — Z87891 Personal history of nicotine dependence: Secondary | ICD-10-CM | POA: Insufficient documentation

## 2019-01-16 DIAGNOSIS — I2510 Atherosclerotic heart disease of native coronary artery without angina pectoris: Secondary | ICD-10-CM | POA: Insufficient documentation

## 2019-01-16 DIAGNOSIS — E1122 Type 2 diabetes mellitus with diabetic chronic kidney disease: Secondary | ICD-10-CM | POA: Diagnosis not present

## 2019-01-16 DIAGNOSIS — Z7901 Long term (current) use of anticoagulants: Secondary | ICD-10-CM | POA: Insufficient documentation

## 2019-01-16 DIAGNOSIS — G4730 Sleep apnea, unspecified: Secondary | ICD-10-CM | POA: Diagnosis not present

## 2019-01-16 DIAGNOSIS — M1990 Unspecified osteoarthritis, unspecified site: Secondary | ICD-10-CM | POA: Diagnosis not present

## 2019-01-16 DIAGNOSIS — I340 Nonrheumatic mitral (valve) insufficiency: Secondary | ICD-10-CM

## 2019-01-16 DIAGNOSIS — Z91040 Latex allergy status: Secondary | ICD-10-CM | POA: Insufficient documentation

## 2019-01-16 DIAGNOSIS — E785 Hyperlipidemia, unspecified: Secondary | ICD-10-CM | POA: Insufficient documentation

## 2019-01-16 DIAGNOSIS — Z79899 Other long term (current) drug therapy: Secondary | ICD-10-CM | POA: Diagnosis not present

## 2019-01-16 DIAGNOSIS — N186 End stage renal disease: Secondary | ICD-10-CM | POA: Diagnosis not present

## 2019-01-16 DIAGNOSIS — Z880 Allergy status to penicillin: Secondary | ICD-10-CM | POA: Insufficient documentation

## 2019-01-16 DIAGNOSIS — M109 Gout, unspecified: Secondary | ICD-10-CM | POA: Diagnosis not present

## 2019-01-16 DIAGNOSIS — I252 Old myocardial infarction: Secondary | ICD-10-CM | POA: Insufficient documentation

## 2019-01-16 DIAGNOSIS — Z79890 Hormone replacement therapy (postmenopausal): Secondary | ICD-10-CM | POA: Insufficient documentation

## 2019-01-16 DIAGNOSIS — I4892 Unspecified atrial flutter: Secondary | ICD-10-CM | POA: Diagnosis not present

## 2019-01-16 DIAGNOSIS — Z9981 Dependence on supplemental oxygen: Secondary | ICD-10-CM | POA: Diagnosis not present

## 2019-01-16 DIAGNOSIS — I5030 Unspecified diastolic (congestive) heart failure: Secondary | ICD-10-CM | POA: Diagnosis not present

## 2019-01-16 DIAGNOSIS — Z882 Allergy status to sulfonamides status: Secondary | ICD-10-CM | POA: Insufficient documentation

## 2019-01-16 DIAGNOSIS — I4819 Other persistent atrial fibrillation: Secondary | ICD-10-CM | POA: Diagnosis not present

## 2019-01-16 DIAGNOSIS — Z955 Presence of coronary angioplasty implant and graft: Secondary | ICD-10-CM | POA: Insufficient documentation

## 2019-01-16 DIAGNOSIS — Z6841 Body Mass Index (BMI) 40.0 and over, adult: Secondary | ICD-10-CM | POA: Insufficient documentation

## 2019-01-16 DIAGNOSIS — N184 Chronic kidney disease, stage 4 (severe): Secondary | ICD-10-CM | POA: Insufficient documentation

## 2019-01-16 DIAGNOSIS — Z9851 Tubal ligation status: Secondary | ICD-10-CM | POA: Insufficient documentation

## 2019-01-16 DIAGNOSIS — Z794 Long term (current) use of insulin: Secondary | ICD-10-CM | POA: Diagnosis not present

## 2019-01-16 DIAGNOSIS — I130 Hypertensive heart and chronic kidney disease with heart failure and stage 1 through stage 4 chronic kidney disease, or unspecified chronic kidney disease: Secondary | ICD-10-CM | POA: Insufficient documentation

## 2019-01-16 DIAGNOSIS — I5032 Chronic diastolic (congestive) heart failure: Secondary | ICD-10-CM | POA: Diagnosis not present

## 2019-01-16 DIAGNOSIS — K219 Gastro-esophageal reflux disease without esophagitis: Secondary | ICD-10-CM | POA: Diagnosis not present

## 2019-01-16 DIAGNOSIS — K760 Fatty (change of) liver, not elsewhere classified: Secondary | ICD-10-CM | POA: Insufficient documentation

## 2019-01-16 DIAGNOSIS — I132 Hypertensive heart and chronic kidney disease with heart failure and with stage 5 chronic kidney disease, or end stage renal disease: Secondary | ICD-10-CM | POA: Diagnosis not present

## 2019-01-16 DIAGNOSIS — I495 Sick sinus syndrome: Secondary | ICD-10-CM | POA: Insufficient documentation

## 2019-01-16 DIAGNOSIS — Z888 Allergy status to other drugs, medicaments and biological substances status: Secondary | ICD-10-CM | POA: Insufficient documentation

## 2019-01-16 DIAGNOSIS — Z1159 Encounter for screening for other viral diseases: Secondary | ICD-10-CM | POA: Diagnosis not present

## 2019-01-16 DIAGNOSIS — I739 Peripheral vascular disease, unspecified: Secondary | ICD-10-CM | POA: Diagnosis not present

## 2019-01-16 DIAGNOSIS — Z66 Do not resuscitate: Secondary | ICD-10-CM | POA: Diagnosis not present

## 2019-01-16 DIAGNOSIS — Z8249 Family history of ischemic heart disease and other diseases of the circulatory system: Secondary | ICD-10-CM | POA: Insufficient documentation

## 2019-01-16 HISTORY — PX: CARDIOVERSION: SHX1299

## 2019-01-16 HISTORY — PX: TEE WITHOUT CARDIOVERSION: SHX5443

## 2019-01-16 LAB — POCT I-STAT 4, (NA,K, GLUC, HGB,HCT)
Glucose, Bld: 156 mg/dL — ABNORMAL HIGH (ref 70–99)
HCT: 40 % (ref 36.0–46.0)
Hemoglobin: 13.6 g/dL (ref 12.0–15.0)
Potassium: 3.4 mmol/L — ABNORMAL LOW (ref 3.5–5.1)
Sodium: 142 mmol/L (ref 135–145)

## 2019-01-16 LAB — SARS CORONAVIRUS 2 BY RT PCR (HOSPITAL ORDER, PERFORMED IN ~~LOC~~ HOSPITAL LAB): SARS Coronavirus 2: NEGATIVE

## 2019-01-16 SURGERY — ECHOCARDIOGRAM, TRANSESOPHAGEAL
Anesthesia: Monitor Anesthesia Care

## 2019-01-16 MED ORDER — SODIUM CHLORIDE 0.9 % IV SOLN
INTRAVENOUS | Status: DC
  Administered 2019-01-16: 11:00:00 via INTRAVENOUS

## 2019-01-16 MED ORDER — BUTAMBEN-TETRACAINE-BENZOCAINE 2-2-14 % EX AERO
INHALATION_SPRAY | CUTANEOUS | Status: DC | PRN
  Administered 2019-01-16: 2 via TOPICAL

## 2019-01-16 MED ORDER — PROPOFOL 10 MG/ML IV BOLUS
INTRAVENOUS | Status: DC | PRN
  Administered 2019-01-16 (×2): 20 mg via INTRAVENOUS

## 2019-01-16 MED ORDER — LIDOCAINE 2% (20 MG/ML) 5 ML SYRINGE
INTRAMUSCULAR | Status: DC | PRN
  Administered 2019-01-16: 60 mg via INTRAVENOUS

## 2019-01-16 MED ORDER — PROPOFOL 500 MG/50ML IV EMUL
INTRAVENOUS | Status: DC | PRN
  Administered 2019-01-16: 50 ug/kg/min via INTRAVENOUS

## 2019-01-16 NOTE — Anesthesia Procedure Notes (Signed)
Date/Time: 01/16/2019 11:11 AM Performed by: Trinna Post., CRNA Pre-anesthesia Checklist: Patient identified, Emergency Drugs available, Suction available, Patient being monitored and Timeout performed Patient Re-evaluated:Patient Re-evaluated prior to induction Oxygen Delivery Method: Nasal cannula Preoxygenation: Pre-oxygenation with 100% oxygen Induction Type: IV induction Placement Confirmation: positive ETCO2

## 2019-01-16 NOTE — Transfer of Care (Signed)
Immediate Anesthesia Transfer of Care Note  Patient: [name]  Procedure(s) Performed: TRANSESOPHAGEAL ECHOCARDIOGRAM (N/A ) CARDIOVERSION (N/A )  Patient Location: Endoscopy Unit  Anesthesia Type:MAC  Level of Consciousness: awake, alert  and oriented  Airway & Oxygen Therapy: Patient Spontanous Breathing and Patient connected to face mask oxygen  Post-op Assessment: Report given to RN and Post -op Vital signs reviewed and stable  Post vital signs: Reviewed and stable  Last Vitals:  Vitals Value Taken Time  BP    Temp    Pulse    Resp 19 01/16/2019 11:57 AM  SpO2    Vitals shown include unvalidated device data.  Last Pain:  Vitals:   01/16/19 1034  TempSrc: Oral         Complications: No apparent anesthesia complications

## 2019-01-16 NOTE — Progress Notes (Signed)
  Echocardiogram Echocardiogram Transesophageal has been performed.  [name] L Androw 01/16/2019, 12:03 PM

## 2019-01-16 NOTE — Interval H&P Note (Signed)
History and Physical Interval Note:  01/16/2019 11:33 AM  [name]  has presented today for surgery, with the diagnosis of AFIB.  The various methods of treatment have been discussed with the patient and family. After consideration of risks, benefits and other options for treatment, the patient has consented to  Procedure(s): TRANSESOPHAGEAL ECHOCARDIOGRAM (N/A) CARDIOVERSION (N/A) as a surgical intervention.  The patient's history has been reviewed, patient examined, no change in status, stable for surgery.  I have reviewed the patient's chart and labs.  Questions were answered to the patient's satisfaction.     [name]

## 2019-01-16 NOTE — Interval H&P Note (Signed)
History and Physical Interval Note:  01/16/2019 11:33 AM  [name]  has presented today for surgery, with the diagnosis of AFIB.  The various methods of treatment have been discussed with the patient and family. After consideration of risks, benefits and other options for treatment, the patient has consented to  Procedure(s): TRANSESOPHAGEAL ECHOCARDIOGRAM (N/A) CARDIOVERSION (N/A) as a surgical intervention.  The patient's history has been reviewed, patient examined, no change in status, stable for surgery.  I have reviewed the patient's chart and labs.  Questions were answered to the patient's satisfaction.     Tiffiny Worthy [employer]

## 2019-01-16 NOTE — Procedures (Signed)
Electrical Cardioversion Procedure Note [name] [id number] December 17, 1945  Procedure: Electrical Cardioversion Indications:  Atrial Flutter  Procedure Details Consent: Risks of procedure as well as the alternatives and risks of each were explained to the (patient/caregiver).  Consent for procedure obtained. Time Out: Verified patient identification, verified procedure, site/side was marked, verified correct patient position, special equipment/implants available, medications/allergies/relevent history reviewed, required imaging and test results available.  Performed  Patient placed on cardiac monitor, pulse oximetry, supplemental oxygen as necessary.  Sedation given: Propofol per anesthesiology Pacer pads placed anterior and posterior chest.  Cardioverted 1 time(s).  Cardioverted at [place].  Evaluation Findings: Post procedure EKG shows: NSR Complications: None Patient did tolerate procedure well.   [name] 01/16/2019, 11:49 AM

## 2019-01-16 NOTE — Discharge Instructions (Signed)
Electrical Cardioversion, Care After °This sheet gives you information about how to care for yourself after your procedure. Your health care provider may also give you more specific instructions. If you have problems or questions, contact your health care provider. °What can I expect after the procedure? °After the procedure, it is common to have: °· Some redness on the skin where the shocks were given. °Follow these instructions at home: ° °· Do not drive for 24 hours if you were given a medicine to help you relax (sedative). °· Take over-the-counter and prescription medicines only as told by your health care provider. °· Ask your health care provider how to check your pulse. Check it often. °· Rest for 48 hours after the procedure or as told by your health care provider. °· Avoid or limit your caffeine use as told by your health care provider. °Contact a health care provider if: °· You feel like your heart is beating too quickly or your pulse is not regular. °· You have a serious muscle cramp that does not go away. °Get help right away if: ° °· You have discomfort in your chest. °· You are dizzy or you feel faint. °· You have trouble breathing or you are short of breath. °· Your speech is slurred. °· You have trouble moving an arm or leg on one side of your body. °· Your fingers or toes turn cold or blue. °This information is not intended to replace advice given to you by your health care provider. Make sure you discuss any questions you have with your health care provider. °Document Released: 06/11/2013 Document Revised: 03/24/2016 Document Reviewed: 02/25/2016 °Elsevier Interactive Patient Education © 2019 [employer] ° °

## 2019-01-16 NOTE — Anesthesia Preprocedure Evaluation (Addendum)
Anesthesia Evaluation  Patient identified by MRN, date of birth, ID band Patient awake    Reviewed: Allergy & Precautions, NPO status , Patient's Chart, lab work & pertinent test results, reviewed documented beta blocker date and time   History of Anesthesia Complications (+) history of anesthetic complications  Airway Mallampati: IV   Neck ROM: full    Dental  (+) Dental Advisory Given,    Pulmonary shortness of breath, asthma , sleep apnea , former smoker,    breath sounds clear to auscultation       Cardiovascular hypertension, Pt. on home beta blockers + angina + CAD, + Past MI, + Peripheral Vascular Disease and +CHF  + Valvular Problems/Murmurs  Rhythm:Irregular Rate:Normal     Neuro/Psych  Neuromuscular disease    GI/Hepatic GERD  ,  Endo/Other  diabetes, Type 2Morbid obesity  Renal/GU Renal InsufficiencyRenal disease     Musculoskeletal  (+) Arthritis ,   Abdominal   Peds  Hematology  (+) anemia ,   Anesthesia Other Findings 7/18 ECHO    - Left ventricle: The cavity size was normal. Wall thickness was   increased in a pattern of moderate LVH. Systolic function was   normal. The estimated ejection fraction was in the range of 55%   to 60%. Indeterminant diastolic function.   Reproductive/Obstetrics                             Anesthesia Physical Anesthesia Plan  ASA: III  Anesthesia Plan: MAC   Post-op Pain Management:    Induction: Intravenous  PONV Risk Score and Plan: 3 and Treatment may vary due to age or medical condition, TIVA and Propofol infusion  Airway Management Planned: Simple Face Mask  Additional Equipment:   Intra-op Plan:   Post-operative Plan:   Informed Consent: I have reviewed the patients History and Physical, chart, labs and discussed the procedure including the risks, benefits and alternatives for the proposed anesthesia with the patient or  authorized representative who has indicated his/her understanding and acceptance.     Dental advisory given  Plan Discussed with: CRNA  Anesthesia Plan Comments:        Anesthesia Quick Evaluation

## 2019-01-16 NOTE — CV Procedure (Signed)
Procedure: TEE  Indication: Atrial flutter  Sedation: Per anesthesiology  Findings: Please see echo section for full report.  Normal LV size with moderate LV hypertrophy.  EF 55-60%, normal wall motion.  Normal RV size and systolic function, pacemaker in RV.  There was mild TR with peak RV-RA gradient 29 mmHg.  Mild-moderate MR.  Mild left atrial enlargement, no LA appendage thrombus.  Normal right atrium.  The aortic valve was trileaflet with mild calcification, no stenosis or regurgitation.  Grade 3 plaque in descending thoracic aorta.    May proceed with DCCV.   [name] 01/16/2019 11:49 AM

## 2019-01-17 ENCOUNTER — Encounter (HOSPITAL_COMMUNITY): Payer: Self-pay | Admitting: Cardiology

## 2019-01-24 ENCOUNTER — Ambulatory Visit (HOSPITAL_COMMUNITY)
Admission: RE | Admit: 2019-01-24 | Discharge: 2019-01-24 | Disposition: A | Discharge status: Home / Self Care | Payer: HMO | Source: Ambulatory Visit | Attending: Nurse Practitioner | Admitting: Nurse Practitioner

## 2019-01-24 ENCOUNTER — Other Ambulatory Visit: Payer: Self-pay

## 2019-01-24 ENCOUNTER — Other Ambulatory Visit (HOSPITAL_COMMUNITY): Payer: Self-pay | Admitting: *Deleted

## 2019-01-24 ENCOUNTER — Encounter (HOSPITAL_COMMUNITY): Payer: Self-pay | Admitting: Nurse Practitioner

## 2019-01-24 VITALS — Ht 64.0 in

## 2019-01-24 DIAGNOSIS — I4819 Other persistent atrial fibrillation: Secondary | ICD-10-CM

## 2019-01-24 DIAGNOSIS — I5089 Other heart failure: Secondary | ICD-10-CM

## 2019-01-24 NOTE — Progress Notes (Signed)
Electrophysiology TeleHealth Note   Due to national recommendations of social distancing due to [place] 19, Audio/video telehealth visit is felt to be most appropriate for this patient at this time.  See MyChart message/consent below from today for patient consent regarding telehealth for the At[facility] Fibrillation Clinic.    Date:  01/24/2019   ID:  [name], DOB 1946/06/03, MRN [id number]  Location: home  Provider location: [address] [place],  [address] Evaluation Performed: Follow up afib x 2 days per device clinic  PCP:  Merrilee Seashore, MD  Primary Cardiologist:   Dr. Aundra Dubin Primary Electrophysiologist: Dr. Rayann Heman  CC: " I feel so much better."   History of Present Illness: [name] is a 73 y.o. female who presents via audio/video conferencing for a telehealth visit today.   The patient  referred for afib on device sine 4/26.   Pt seen 4/28 for persisitent afib for several days. Was already on amiodarone for PAF for some time and has a very complicated history  chronic kidney disease stage IV, sleep apnea, interstitial lung disease, CAD, A. fib, hypothyroidism, hypertension, diastolic CHF and has been in Hospice since January 2020. Pt has chosen not to pursue dialysis and her creatinine by last lab 09/16/18 was 9.13. Pt knew of no change in meds or overall condition. She has noted increased shortness of breath since being in afib, her weight is actually down 2 lbs from Sunday when it was recorded at 242 lbs and 239 lbs today. Her BP was  soft at 941 systolic and her HR's were running in the 80's, on CCB/BB daily.She has chronic LLE and abdominal swelling. Daughter states that her eliquis was reduced to 2.5 mg bid by a doctor, unsure which one, after her hospitalization in January.  F/u visit, 5/5. I increased amiodarone to 200 mg bid on the last visit and pt sent in a report to device clinc which showed persistent afib but with controlled v rates.The pt reports  that she is feeling better this week, not as short of breath. When I discussed with Dr. Aundra Dubin last week, he felt that she may be a good candidate for cardioversion as he felt that she may decline if left in afib. I questioned what should be her correct dose per her assessment/research, and despite her significant  kidney function, she felt her correct dose based on the literature should be 5 mg bid.   Today, she denies symptoms of palpitations, chest pain, , orthopnea, PND, lower extremity edema, claudication, dizziness, presyncope, syncope, bleeding, or neurologic sequela.+ for acute on chronic dyspnea and chronic edema. The patient is tolerating medications without difficulties and is otherwise without complaint today.   she denies symptoms of cough, fevers, chills, or new SOB worrisome for COVID 19.    F/u 5/22, in afib clinic, following successful cardioversion, 5/15. Device report sent yesterday confirms that pt is staying in rhythm and she reports that she feels so much better! Her energy and breathing is much improved. She cannot tell me what her BP/HR are but home nurse just left and she was told that her BP and HR were great. Will now decrease her amiodarone back to 200 mg bid after it has been 200 mg bid for a couple of weeks to get ready for cardioversion.   Atrial Fibrillation Risk Factors:  she does not have symptoms or diagnosis of sleep apnea. she does not have a history of rheumatic fever. she does not have  a history of alcohol use. The patient does not have a history of early familial atrial fibrillation or other arrhythmias.  she has a BMI of Body mass index is 40.85 kg/m.Marland Kitchen There were no vitals filed for this visit.  Past Medical History:  Diagnosis Date  . Anginal pain ([place])    occ; non-ischemic Lexiscan 09/2012  . Arthritis   . Asthma   . Atrial flutter ([place])    ablated by Dr Lovena Le in 2008  . CKD (chronic kidney disease) 04/2007   CKD stage 4(Dr. Erling Cruz)  .  Complication of anesthesia     DIFFICULTY BREATHING   . Coronary atherosclerosis of native coronary artery   . Diastolic heart failure 01/8849   grade 2 diastolic dysfunction per 10/7739 echo  . DNI (do not intubate)   . DNR (do not resuscitate)   . Esophageal dysmotility 2008   noted on esophagram.  hx dysphagia.   . Fatty liver 2008   noted on ultrasound 2008  . Fatty tumor fatty tumor back  . GERD (gastroesophageal reflux disease) 2012   Barrets esophagus on bx 2012 and 2014.   Marland Kitchen Gouty arthropathy   . Heart murmur   . Hyperlipidemia   . Hypertension   . IDDM (insulin dependent diabetes mellitus) ([place])    type 2.   . Morbid obesity ([place])   . Myocardial infarction ([place]) 2009  . Peripheral vascular disease ([place])   . Persistent atrial fibrillation    chads2 vasc score of at least 5  . Sick sinus syndrome ([place])   . Sleep apnea    wears CPAP   Past Surgical History:  Procedure Laterality Date  . ATRIAL FLUTTER ABLATION  2008   CTI ablation by Dr Lovena Le  . BREAST LUMPECTOMY     right  . CARDIOVERSION N/A 03/03/2014   Procedure: CARDIOVERSION;  Surgeon: Laverda Page, MD;  Location: [facility];  Service: Cardiovascular;  Laterality: N/A;  . CARDIOVERSION N/A 03/30/2017   Procedure: CARDIOVERSION;  Surgeon: Larey Dresser, MD;  Location: [facility] ENDOSCOPY;  Service: Cardiovascular;  Laterality: N/A;  . CARDIOVERSION N/A 01/16/2019   Procedure: CARDIOVERSION;  Surgeon: Larey Dresser, MD;  Location: [facility] ENDOSCOPY;  Service: Cardiovascular;  Laterality: N/A;  . COLONOSCOPY WITH PROPOFOL N/A 02/18/2015   Procedure: COLONOSCOPY WITH PROPOFOL;  Surgeon: Milus Banister, MD;  Location: [place];  Service: Endoscopy;  Laterality: N/A;  . CORONARY ANGIOPLASTY WITH STENT PLACEMENT    . PACEMAKER INSERTION  06/18/14   STJ Assurity dual chamber pacemaker implanted by Dr Rayann Heman  . PERMANENT PACEMAKER INSERTION N/A 06/18/2014   SJM Assurity DR pacemaker implanted by Dr Rayann Heman for sick  sinus syndrome  . RIGHT HEART CATHETERIZATION N/A 06/12/2014   Procedure: RIGHT HEART CATH;  Surgeon: Jolaine Artist, MD;  Location: [facility] CATH LAB;  Service: Cardiovascular;  Laterality: N/A;  . TEE WITHOUT CARDIOVERSION N/A 01/16/2019   Procedure: TRANSESOPHAGEAL ECHOCARDIOGRAM;  Surgeon: Larey Dresser, MD;  Location: [facility] ENDOSCOPY;  Service: Cardiovascular;  Laterality: N/A;  . TONSILLECTOMY    . TOTAL KNEE ARTHROPLASTY Right 12/15/2013   Procedure: RIGHT TOTAL KNEE ARTHROPLASTY;  Surgeon: Alta Corning, MD;  Location: [place];  Service: Orthopedics;  Laterality: Right;  . TUBAL LIGATION    . VIDEO BRONCHOSCOPY Bilateral 12/23/2015   Procedure: VIDEO BRONCHOSCOPY WITH FLUORO;  Surgeon: Chesley Mires, MD;  Location: [facility] ENDOSCOPY;  Service: Cardiopulmonary;  Laterality: Bilateral;     Current Outpatient Medications  Medication Sig Dispense Refill  .  acetaminophen (TYLENOL) 500 MG tablet Take 500-1,000 mg by mouth every 6 (six) hours as needed for mild pain or headache.    . albuterol (PROVENTIL) (2.5 MG/3ML) 0.083% nebulizer solution Take 3 mLs (2.5 mg total) by nebulization every 6 (six) hours as needed for shortness of breath. 75 mL 3  . amiodarone (PACERONE) 200 MG tablet Take 200 mg by mouth 2 (two) times daily.     Marland Kitchen amitriptyline (ELAVIL) 25 MG tablet Take 25 mg by mouth at bedtime.      Marland Kitchen apixaban (ELIQUIS) 5 MG TABS tablet Take 1 tablet (5 mg total) by mouth 2 (two) times daily. 60 tablet 3  . atorvastatin (LIPITOR) 40 MG tablet Take 1 tablet by mouth once daily 90 tablet 3  . CARTIA XT 180 MG 24 hr capsule TAKE 1 CAPSULE BY MOUTH ONCE DAILY 90 capsule 1  . dicyclomine (BENTYL) 20 MG tablet Take 20 mg by mouth daily at 12 noon.    Marland Kitchen esomeprazole (NEXIUM) 40 MG capsule TAKE 1 CAPSULE BY MOUTH TWICE DAILY BEFORE MEAL(S) (Patient taking differently: Take 40 mg by mouth daily before breakfast. ) 60 capsule 1  . hydrALAZINE (APRESOLINE) 25 MG tablet TAKE 1 TABLET BY MOUTH THREE TIMES DAILY  (Patient taking differently: Take 25 mg by mouth 3 (three) times daily. ) 270 tablet 1  . hydrOXYzine (ATARAX/VISTARIL) 10 MG tablet Take 10 mg by mouth every 4 (four) hours as needed for itching.    . insulin glargine (LANTUS) 100 UNIT/ML injection Inject 50 Units into the skin daily after breakfast.     . levothyroxine (SYNTHROID, LEVOTHROID) 25 MCG tablet Take 25 mcg by mouth daily before lunch.     Marland Kitchen LINZESS 290 MCG CAPS capsule Take 290 mcg by mouth daily before breakfast.     . metolazone (ZAROXOLYN) 2.5 MG tablet Take 1 tablet (2.5 mg total) by mouth 2 (two) times a week. Tuesday and Friday (Patient taking differently: Take 2.5 mg by mouth 2 (two) times a week. TUESDAYS and SATURDAYS) 8 tablet 6  . metoprolol tartrate (LOPRESSOR) 25 MG tablet TAKE 3 TABLETS BY MOUTH TWICE DAILY (Patient taking differently: Take 75 mg by mouth 2 (two) times daily. ) 180 tablet 5  . nitroGLYCERIN (NITROSTAT) 0.4 MG SL tablet Place 0.4 mg under the tongue every 5 (five) minutes x 3 doses as needed for chest pain.     Marland Kitchen NOVOLOG FLEXPEN 100 UNIT/ML FlexPen Inject 40 Units into the skin 3 (three) times daily as needed (for a BGL of 200 or greater).     . OXYGEN Inhale 5 L into the lungs continuous.     . potassium chloride SA (K-DUR,KLOR-CON) 20 MEQ tablet Take 2 tablets (40 mEq total) by mouth 2 (two) times daily. (Patient taking differently: Take 20 mEq by mouth 4 (four) times daily. )    . RELION PEN NEEDLES 32G X 4 MM MISC     . torsemide (DEMADEX) 100 MG tablet Take 100 mg by mouth 2 (two) times daily.    Marland Kitchen ULORIC 80 MG TABS Take 80 mg by mouth daily.     . Vitamin D, Ergocalciferol, (DRISDOL) 50000 UNITS CAPS capsule Take 50,000 Units by mouth every Monday.    P[place]ash HFA 45 MCG/ACT inhaler Inhale 2 puffs into the lungs every 4 (four) hours as needed for wheezing or shortness of breath. 1 Inhaler 1  . zolpidem (AMBIEN) 10 MG tablet Take 10 mg by mouth at bedtime.  No current facility-administered  medications for this encounter.     Allergies:   Imdur [isosorbide dinitrate]; Adhesive [tape]; Sulfa antibiotics; Gabapentin; Latex; Lyrica [pregabalin]; Codeine; and Penicillins   Social History:  The patient  reports that she quit smoking about 23 years ago. Her smoking use included cigarettes. She has a 17.50 pack-year smoking history. She has never used smokeless tobacco. She reports that she does not drink alcohol or use drugs.   Family History:  The patient's  family history includes Bladder Cancer in her mother; Colon cancer in her maternal aunt; Heart disease in her mother; Kidney disease in her mother; Ovarian cancer in her daughter; Stomach cancer in her maternal uncle.    ROS:  Please see the history of present illness.   All other systems are personally reviewed and negative.   Exam: NA, telephone visit  Recent Labs: 09/13/2018: ALT 16; TSH 2.797 09/14/2018: Platelets 171 09/15/2018: B Natriuretic Peptide 149.7 09/16/2018: BUN 86; Creatinine, Ser 9.18 01/16/2019: Hemoglobin 13.6; Potassium 3.4; Sodium 142  personally reviewed    Other studies personally reviewed: Device strip rviewed Epic records reviewed    ASSESSMENT AND PLAN:   1.  Persistent atrial fibrillation Successful cardioversion Last device report sent  yesterday confirms that she is back in [place], but pt can tell as she feels so much better.  Reduce  Amiodarone to  200 mg daily  2. CHA2DS2VASc score of at least 4 I checked with Fuller [place], PharmD and she feels that pt should be   on 5 mg bid by her literature search, despite her CKD and  abnormal creatine  Will continue this dose   2. CHF Weight appears stable    Continue daily weights Avoid salt  Continure daily diuretics    This patients CHA2DS2-VASc Score and unadjusted Ischemic Stroke Rate (% per year) is equal to 4.8 % stroke rate/year from a score of 4  Above score calculated as 1 point each if present [CHF, HTN, DM, Vascular=MI/PAD/Aortic  Plaque, Age if 65-74, or Female] Above score calculated as 2 points each if present [Age > 75, or Stroke/TIA/TE]   COVID screen The patient does not have any symptoms that suggest any further testing/ screening at this time.  Social distancing reinforced today.  Follow-up: per Hospice and remote check next is due in July  Current medicines are reviewed at length with the patient today.   The patient does not have concerns regarding her medicines.  The following changes were made today:  reduction of amiodarone back to 200 mg daily  Labs/ tests ordered today include: none No orders of the defined types were placed in this encounter.   Patient Risk:  after full review of this patients clinical status, I feel that they are at  very high risk for decompensation  at this time.   Today, I have spent 10 minutes with the patient with telehealth technology discussing afib and med change  Signed, Roderic Palau NP  01/24/2019 11:23 AM  Afib [facility] [address] [place], [place] [address] [phone]   I hereby voluntarily request, consent and authorize the [facility] and its employed or contracted physicians, physician assistants, nurse practitioners or other licensed health care professionals (the Practitioner), to provide me with telemedicine health care services (the "Services") as deemed necessary by the treating Practitioner. I acknowledge and consent to receive the Services by the Practitioner via telemedicine. I understand that the telemedicine visit will involve communicating with the Practitioner through  live Emergency planning/management officer and the disclosure of certain medical information by electronic transmission. I acknowledge that I have been given the opportunity to request an in-person assessment or other available alternative prior to the telemedicine visit and am voluntarily participating in the telemedicine visit.   I understand  that I have the right to withhold or withdraw my consent to the use of telemedicine in the course of my care at any time, without affecting my right to future care or treatment, and that the Practitioner or I may terminate the telemedicine visit at any time. I understand that I have the right to inspect all information obtained and/or recorded in the course of the telemedicine visit and may receive copies of available information for a reasonable fee.  I understand that some of the potential risks of receiving the Services via telemedicine include:   Delay or interruption in medical evaluation due to technological equipment failure or disruption;  Information transmitted may not be sufficient (e.g. poor resolution of images) to allow for appropriate medical decision making by the Practitioner; and/or  In rare instances, security protocols could fail, causing a breach of personal health information.   Furthermore, I acknowledge that it is my responsibility to provide information about my medical history, conditions and care that is complete and accurate to the best of my ability. I acknowledge that Practitioner's advice, recommendations, and/or decision may be based on factors not within their control, such as incomplete or inaccurate data provided by me or distortions of diagnostic images or specimens that may result from electronic transmissions. I understand that the practice of medicine is not an exact science and that Practitioner makes no warranties or guarantees regarding treatment outcomes. I acknowledge that I will receive a copy of this consent concurrently upon execution via email to the email address I last provided but may also request a printed copy by calling the office of the [facility].  I understand that my insurance will be billed for this visit.   I have read or had this consent read to me.  I understand the contents of this consent, which adequately explains the benefits  and risks of the Services being provided via telemedicine.  I have been provided ample opportunity to ask questions regarding this consent and the Services and have had my questions answered to my satisfaction.  I give my informed consent for the services to be provided through the use of telemedicine in my medical care  By participating in this telemedicine visit I agree to the above.

## 2019-01-27 ENCOUNTER — Encounter (HOSPITAL_COMMUNITY): Payer: Self-pay | Admitting: Cardiology

## 2019-01-27 NOTE — Anesthesia Postprocedure Evaluation (Signed)
Anesthesia Post Note  Patient: [name]  Procedure(s) Performed: TRANSESOPHAGEAL ECHOCARDIOGRAM (N/A ) CARDIOVERSION (N/A )     Patient location during evaluation: PACU Anesthesia Type: MAC Level of consciousness: awake and alert Pain management: pain level controlled Vital Signs Assessment: post-procedure vital signs reviewed and stable Respiratory status: spontaneous breathing Cardiovascular status: stable Anesthetic complications: no    Last Vitals:  Vitals:   01/16/19 1218 01/16/19 1228  BP: 137/66 137/68  Pulse: 70 70  Resp: 16 18  Temp:    SpO2: 100% 99%    Last Pain:  Vitals:   01/16/19 1228  TempSrc:   PainSc: 0-No pain                 Nolon Nations

## 2019-02-03 ENCOUNTER — Telehealth: Payer: Self-pay

## 2019-02-03 NOTE — Telephone Encounter (Signed)
Spoke to pt regarding alert for AF >1 day s/p cardioversion 01/16/19. Pt c/o SOB and palpitations. Also states amiodarone dose was decreased after cardioversion, reports no missed doses. Will refer back to AF clinic.

## 2019-02-04 NOTE — Telephone Encounter (Signed)
Will have a telephone visit with Debra Palau Np tomorrow to discuss.

## 2019-02-04 NOTE — Telephone Encounter (Signed)
Agree, would not re-attempt.

## 2019-02-05 ENCOUNTER — Ambulatory Visit (HOSPITAL_COMMUNITY)
Admission: RE | Admit: 2019-02-05 | Discharge: 2019-02-05 | Disposition: A | Discharge status: Home / Self Care | Source: Ambulatory Visit | Attending: Nurse Practitioner | Admitting: Nurse Practitioner

## 2019-02-05 ENCOUNTER — Encounter (HOSPITAL_COMMUNITY): Payer: Self-pay | Admitting: Nurse Practitioner

## 2019-02-05 ENCOUNTER — Other Ambulatory Visit: Payer: Self-pay

## 2019-02-05 VITALS — Ht 64.0 in

## 2019-02-05 DIAGNOSIS — I4819 Other persistent atrial fibrillation: Secondary | ICD-10-CM

## 2019-02-05 NOTE — Progress Notes (Addendum)
Electrophysiology TeleHealth Note   Due to national recommendations of social distancing due to [place] 19, Audio/video telehealth visit is felt to be most appropriate for this patient at this time.  See MyChart message/consent below from today for patient consent regarding telehealth for the [facility].    Date:  02/05/2019   ID:  [name], DOB 01/31/1946, MRN [id number]  Location: home  Provider location: [address] [place], [place] [address] Evaluation Performed: Follow up afib x 2 days per device clinic  PCP:  Merrilee Seashore, MD  Primary Cardiologist:   Dr. Aundra Dubin Primary Electrophysiologist: Dr. Rayann Heman  CC:  ERAF after cardoverson   History of Present Illness: [name] is a 73 y.o. female who presents via audio/video conferencing for a telehealth visit today.   The patient  referred for afib on device sine 4/26.   Pt seen 4/28 for persisitent afib for several days. Was already on amiodarone for PAF for some time and has a very complicated history  chronic kidney disease stage IV, sleep apnea, interstitial lung disease, CAD, A. fib, hypothyroidism, hypertension, diastolic CHF and has been in Hospice since January 2020. Pt has chosen not to pursue dialysis and her creatinine by last lab 09/16/18 was 9.13. Pt knew of no change in meds or overall condition. She has noted increased shortness of breath since being in afib, her weight is actually down 2 lbs from Sunday when it was recorded at 242 lbs and 239 lbs today. Her BP was  soft at 962 systolic and her HR's were running in the 80's, on CCB/BB daily.She has chronic LLE and abdominal swelling.  F/u visit, 5/5. I increased amiodarone to 200 mg bid on the last visit and pt sent in a report to device clinc which showed persistent afib but with controlled v rates.The pt reports that she is feeling better this week, not as short of breath. When I discussed with Dr. Aundra Dubin last week, he felt that she may be a  good candidate for cardioversion as he felt that she may decline if left in afib. I questioned what should be her correct dose per her assessment/research, and despite her significant  kidney function, she felt her correct dose based on the literature should be 5 mg bid.   Fu with pt today 6/3 as device clinic reported that pt went back into afib 10-14 days after cardioversion.  Today, she denies symptoms of palpitations, chest pain, , orthopnea, PND, lower extremity edema, claudication, dizziness, presyncope, syncope, bleeding, or neurologic sequela.+ for acute on chronic dyspnea and chronic edema. The patient is tolerating medications without difficulties and is otherwise without complaint today.   she denies symptoms of cough, fevers, chills, or new SOB worrisome for COVID 19.    F/u 5/22, in afib clinic, following successful cardioversion, 5/15. Device report sent yesterday confirms that pt is staying in rhythm and she reports that she feels so much better! Her energy and breathing is much improved. She cannot tell me what her BP/HR are but home nurse just left and she was told that her BP and HR were great. Will now decrease her amiodarone back to 200 mg bid after it has been 200 mg bid for a couple of weeks to get ready for cardioversion.  F/u 6/3. Unfortunately, SR dd not persist and now pt is in afib with controlled v rates. She feels more fatigued but ok. Her HR this am is 73 wth a BP of  109/73   Atrial Fibrillation Risk Factors:  she does not have symptoms or diagnosis of sleep apnea. she does not have a history of rheumatic fever. she does not have a history of alcohol use. The patient does not have a history of early familial atrial fibrillation or other arrhythmias.  she has a BMI of Body mass index is 40.85 kg/m.Marland Kitchen There were no vitals filed for this visit.  Past Medical History:  Diagnosis Date  . Anginal pain ([place])    occ; non-ischemic Lexiscan 09/2012  . Arthritis   . Asthma    . Atrial flutter ([place])    ablated by Dr Lovena Le in 2008  . CKD (chronic kidney disease) 04/2007   CKD stage 4(Dr. Erling Cruz)  . Complication of anesthesia     DIFFICULTY BREATHING   . Coronary atherosclerosis of native coronary artery   . Diastolic heart failure 03/4127   grade 2 diastolic dysfunction per 03/8675 echo  . DNI (do not intubate)   . DNR (do not resuscitate)   . Esophageal dysmotility 2008   noted on esophagram.  hx dysphagia.   . Fatty liver 2008   noted on ultrasound 2008  . Fatty tumor fatty tumor back  . GERD (gastroesophageal reflux disease) 2012   Barrets esophagus on bx 2012 and 2014.   Marland Kitchen Gouty arthropathy   . Heart murmur   . Hyperlipidemia   . Hypertension   . 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   . Morbid obesity ([place])   . Myocardial infarction ([place]) 2009  . Peripheral vascular disease ([place])   . Persistent atrial fibrillation    chads2 vasc score of at least 5  . Sick sinus syndrome ([place])   . Sleep apnea    wears CPAP   Past Surgical History:  Procedure Laterality Date  . ATRIAL FLUTTER ABLATION  2008   CTI ablation by Dr Lovena Le  . BREAST LUMPECTOMY     right  . CARDIOVERSION N/A 03/03/2014   Procedure: CARDIOVERSION;  Surgeon: Laverda Page, MD;  Location: [place] [facility];  Service: Cardiovascular;  Laterality: N/A;  . CARDIOVERSION N/A 03/30/2017   Procedure: CARDIOVERSION;  Surgeon: Larey Dresser, MD;  Location: [facility] ENDOSCOPY;  Service: Cardiovascular;  Laterality: N/A;  . CARDIOVERSION N/A 01/16/2019   Procedure: CARDIOVERSION;  Surgeon: Larey Dresser, MD;  Location: [facility] ENDOSCOPY;  Service: Cardiovascular;  Laterality: N/A;  . COLONOSCOPY WITH PROPOFOL N/A 02/18/2015   Procedure: COLONOSCOPY WITH PROPOFOL;  Surgeon: Milus Banister, MD;  Location: [place];  Service: Endoscopy;  Laterality: N/A;  . CORONARY ANGIOPLASTY WITH STENT PLACEMENT    . PACEMAKER INSERTION  06/18/14   STJ Assurity dual chamber  pacemaker implanted by Dr Rayann Heman  . PERMANENT PACEMAKER INSERTION N/A 06/18/2014   SJM Assurity DR pacemaker implanted by Dr Rayann Heman for sick sinus syndrome  . RIGHT HEART CATHETERIZATION N/A 06/12/2014   Procedure: RIGHT HEART CATH;  Surgeon: Jolaine Artist, MD;  Location: [facility] CATH LAB;  Service: Cardiovascular;  Laterality: N/A;  . TEE WITHOUT CARDIOVERSION N/A 01/16/2019   Procedure: TRANSESOPHAGEAL ECHOCARDIOGRAM;  Surgeon: Larey Dresser, MD;  Location: [facility] ENDOSCOPY;  Service: Cardiovascular;  Laterality: N/A;  . TONSILLECTOMY    . TOTAL KNEE ARTHROPLASTY Right 12/15/2013   Procedure: RIGHT TOTAL KNEE ARTHROPLASTY;  Surgeon: Alta Corning, MD;  Location: [facility];  Service: Orthopedics;  Laterality: Right;  . TUBAL LIGATION    . VIDEO BRONCHOSCOPY Bilateral 12/23/2015   Procedure:  VIDEO BRONCHOSCOPY WITH FLUORO;  Surgeon: Chesley Mires, MD;  Location: [facility] ENDOSCOPY;  Service: Cardiopulmonary;  Laterality: Bilateral;     Current Outpatient Medications  Medication Sig Dispense Refill  . acetaminophen (TYLENOL) 500 MG tablet Take 500-1,000 mg by mouth every 6 (six) hours as needed for mild pain or headache.    . albuterol (PROVENTIL) (2.5 MG/3ML) 0.083% nebulizer solution Take 3 mLs (2.5 mg total) by nebulization every 6 (six) hours as needed for shortness of breath. 75 mL 3  . amiodarone (PACERONE) 200 MG tablet Take 200 mg by mouth daily.    Marland Kitchen amitriptyline (ELAVIL) 25 MG tablet Take 25 mg by mouth at bedtime.      Marland Kitchen apixaban (ELIQUIS) 5 MG TABS tablet Take 1 tablet (5 mg total) by mouth 2 (two) times daily. 60 tablet 3  . atorvastatin (LIPITOR) 40 MG tablet Take 1 tablet by mouth once daily 90 tablet 3  . CARTIA XT 180 MG 24 hr capsule TAKE 1 CAPSULE BY MOUTH ONCE DAILY 90 capsule 1  . dicyclomine (BENTYL) 20 MG tablet Take 20 mg by mouth daily at 12 noon.    Marland Kitchen esomeprazole (NEXIUM) 40 MG capsule TAKE 1 CAPSULE BY MOUTH TWICE DAILY BEFORE MEAL(S) (Patient taking differently: Take 40 mg by  mouth daily before breakfast. ) 60 capsule 1  . hydrALAZINE (APRESOLINE) 25 MG tablet TAKE 1 TABLET BY MOUTH THREE TIMES DAILY (Patient taking differently: Take 25 mg by mouth 3 (three) times daily. ) 270 tablet 1  . hydrOXYzine (ATARAX/VISTARIL) 10 MG tablet Take 10 mg by mouth every 4 (four) hours as needed for itching.    . insulin glargine (LANTUS) 100 UNIT/ML injection Inject 50 Units into the skin daily after breakfast.     . levothyroxine (SYNTHROID, LEVOTHROID) 25 MCG tablet Take 25 mcg by mouth daily before lunch.     Marland Kitchen LINZESS 290 MCG CAPS capsule Take 290 mcg by mouth daily before breakfast.     . metolazone (ZAROXOLYN) 2.5 MG tablet Take 1 tablet (2.5 mg total) by mouth 2 (two) times a week. Tuesday and Friday (Patient taking differently: Take 2.5 mg by mouth 2 (two) times a week. TUESDAYS and SATURDAYS) 8 tablet 6  . metoprolol tartrate (LOPRESSOR) 25 MG tablet TAKE 3 TABLETS BY MOUTH TWICE DAILY (Patient taking differently: Take 75 mg by mouth 2 (two) times daily. ) 180 tablet 5  . nitroGLYCERIN (NITROSTAT) 0.4 MG SL tablet Place 0.4 mg under the tongue every 5 (five) minutes x 3 doses as needed for chest pain.     Marland Kitchen NOVOLOG FLEXPEN 100 UNIT/ML FlexPen Inject 40 Units into the skin 3 (three) times daily as needed (for a BGL of 200 or greater).     . OXYGEN Inhale 5 L into the lungs continuous.     . potassium chloride SA (K-DUR,KLOR-CON) 20 MEQ tablet Take 2 tablets (40 mEq total) by mouth 2 (two) times daily. (Patient taking differently: Take 20 mEq by mouth 4 (four) times daily. )    . RELION PEN NEEDLES 32G X 4 MM MISC     . torsemide (DEMADEX) 100 MG tablet Take 100 mg by mouth 2 (two) times daily.    Marland Kitchen ULORIC 80 MG TABS Take 80 mg by mouth daily.     . Vitamin D, Ergocalciferol, (DRISDOL) 50000 UNITS CAPS capsule Take 50,000 Units by mouth every Monday.    [place] HFA 45 MCG/ACT inhaler Inhale 2 puffs into the lungs every 4 (four) hours  as needed for wheezing or shortness of  breath. 1 Inhaler 1  . zolpidem (AMBIEN) 10 MG tablet Take 10 mg by mouth at bedtime.      No current facility-administered medications for this encounter.     Allergies:   Imdur [isosorbide dinitrate]; Adhesive [tape]; Sulfa antibiotics; Gabapentin; Latex; Lyrica [pregabalin]; Codeine; and Penicillins   Social History:  The patient  reports that she quit smoking about 23 years ago. Her smoking use included cigarettes. She has a 17.50 pack-year smoking history. She has never used smokeless tobacco. She reports that she does not drink alcohol or use drugs.   Family History:  The patient's  family history includes Bladder Cancer in her mother; Colon cancer in her maternal aunt; Heart disease in her mother; Kidney disease in her mother; Ovarian cancer in her daughter; Stomach cancer in her maternal uncle.    ROS:  Please see the history of present illness.   All other systems are personally reviewed and negative.   Exam: NA, telephone visit  Recent Labs: 09/13/2018: ALT 16; TSH 2.797 09/14/2018: Platelets 171 09/15/2018: B Natriuretic Peptide 149.7 09/16/2018: BUN 86; Creatinine, Ser 9.18 01/16/2019: Hemoglobin 13.6; Potassium 3.4; Sodium 142  personally reviewed    Other studies personally reviewed: Device strip rviewed Epic records reviewed    ASSESSMENT AND PLAN:   1.  Persistent atrial fibrillation Successful cardioversion but ERAF. Discussed  with Dr. Aundra Dubin and Dr. Rayann Heman and the feelng is that she will not be able to maintain SR with her kidney faliure, therefore, repeat DCCV will not be attempted , amiodarone will be stopped and she wll be rate controlled gong forward.  2. CHA2DS2VASc score of at least 4 Continue eliquis  5 mg bid      2. CHF Weight appears stable    Continue daily weights Avoid salt  Continure daily diuretics    This patients CHA2DS2-VASc Score and unadjusted Ischemic Stroke Rate (% per year) is equal to 4.8 % stroke rate/year from a score of 4   Above score calculated as 1 point each if present [CHF, HTN, DM, Vascular=MI/PAD/Aortic Plaque, Age if 65-74, or Female] Above score calculated as 2 points each if present [Age > 75, or Stroke/TIA/TE]   COVID screen The patient does not have any symptoms that suggest any further testing/ screening at this time.  Social distancing reinforced today.  Follow-up: we will give her a phone call Monday to get another HR readng off amio   to make sure she is rate controlled coming off amiodarone. If not will increase BB.  Current medicines are reviewed at length with the patient today.   The patient does not have concerns regarding her medicines.  The following changes were made today:  Stop amiodarone  Labs/ tests ordered today include: none No orders of the defined types were placed in this encounter.   Patient Risk:  after full review of this patients clinical status, I feel that they are at  very high risk for decompensation  at this time.   Today, I have spent 11 minutes with the patient with telehealth technology discussing afib and med change  Signed, Roderic Palau NP  02/05/2019 11:23 AM  Afib [facility] [address] [place], [place] [address] [phone]   I hereby voluntarily request, consent and authorize the [facility] and its employed or contracted physicians, physician assistants, nurse practitioners or other licensed health care professionals (the Practitioner), to provide me with telemedicine health care services (  the "Services") as deemed necessary by the treating Practitioner. I acknowledge and consent to receive the Services by the Practitioner via telemedicine. I understand that the telemedicine visit will involve communicating with the Practitioner through live audiovisual communication technology and the disclosure of certain medical information by electronic transmission. I acknowledge that I have been given the opportunity to request  an in-person assessment or other available alternative prior to the telemedicine visit and am voluntarily participating in the telemedicine visit.   I understand that I have the right to withhold or withdraw my consent to the use of telemedicine in the course of my care at any time, without affecting my right to future care or treatment, and that the Practitioner or I may terminate the telemedicine visit at any time. I understand that I have the right to inspect all information obtained and/or recorded in the course of the telemedicine visit and may receive copies of available information for a reasonable fee.  I understand that some of the potential risks of receiving the Services via telemedicine include:   Delay or interruption in medical evaluation due to technological equipment failure or disruption;  Information transmitted may not be sufficient (e.g. poor resolution of images) to allow for appropriate medical decision making by the Practitioner; and/or  In rare instances, security protocols could fail, causing a breach of personal health information.   Furthermore, I acknowledge that it is my responsibility to provide information about my medical history, conditions and care that is complete and accurate to the best of my ability. I acknowledge that Practitioner's advice, recommendations, and/or decision may be based on factors not within their control, such as incomplete or inaccurate data provided by me or distortions of diagnostic images or specimens that may result from electronic transmissions. I understand that the practice of medicine is not an exact science and that Practitioner makes no warranties or guarantees regarding treatment outcomes. I acknowledge that I will receive a copy of this consent concurrently upon execution via email to the email address I last provided but may also request a printed copy by calling the office of the [facility].  I understand that my  insurance will be billed for this visit.   I have read or had this consent read to me.  I understand the contents of this consent, which adequately explains the benefits and risks of the Services being provided via telemedicine.  I have been provided ample opportunity to ask questions regarding this consent and the Services and have had my questions answered to my satisfaction.  I give my informed consent for the services to be provided through the use of telemedicine in my medical care  By participating in this telemedicine visit I agree to the above.

## 2019-03-01 ENCOUNTER — Other Ambulatory Visit (HOSPITAL_COMMUNITY): Payer: Self-pay | Admitting: Cardiology

## 2019-03-03 ENCOUNTER — Other Ambulatory Visit (HOSPITAL_COMMUNITY): Payer: Self-pay

## 2019-03-03 MED ORDER — METOLAZONE 2.5 MG PO TABS: 2.5000 mg | ORAL_TABLET | ORAL | 0 refills | Status: DC

## 2019-03-17 ENCOUNTER — Ambulatory Visit (INDEPENDENT_AMBULATORY_CARE_PROVIDER_SITE_OTHER): Payer: Medicare Other | Admitting: *Deleted

## 2019-03-17 ENCOUNTER — Telehealth: Payer: Self-pay

## 2019-03-17 DIAGNOSIS — I4819 Other persistent atrial fibrillation: Secondary | ICD-10-CM

## 2019-03-17 DIAGNOSIS — I495 Sick sinus syndrome: Secondary | ICD-10-CM | POA: Diagnosis not present

## 2019-03-17 LAB — CUP PACEART REMOTE DEVICE CHECK
Date Time Interrogation Session: 20200713162947
Implantable Lead Implant Date: 20151015
Implantable Lead Implant Date: 20151015
Implantable Lead Location: 753859
Implantable Lead Location: 753860
Implantable Lead Model: 1948
Implantable Pulse Generator Implant Date: 20151015
Pulse Gen Model: 2240
Pulse Gen Serial Number: 7665001

## 2019-03-17 NOTE — Telephone Encounter (Signed)
Spoke with patient to remind of missed remote transmission 

## 2019-03-25 NOTE — Progress Notes (Signed)
Remote pacemaker transmission.   

## 2019-04-09 ENCOUNTER — Other Ambulatory Visit (HOSPITAL_COMMUNITY): Payer: Self-pay | Admitting: *Deleted

## 2019-04-09 MED ORDER — APIXABAN 5 MG PO TABS: 5.0000 mg | ORAL_TABLET | Freq: Two times a day (BID) | ORAL | 2 refills | Status: DC

## 2019-04-10 ENCOUNTER — Other Ambulatory Visit: Payer: Self-pay

## 2019-04-10 NOTE — Patient Outreach (Signed)
  [facility]) Care Management Chronic Special Needs Program  04/10/2019  Name: [name] DOB: August 26, 1946  MRN: [id number]  [name] is enrolled in a Chronic Special Needs Plan and is is currently under Hospice care. RNCM called client's Hospice nurse, Wilma Flavin, to follow up. HIPAA compliant message left.   Plan: await return call and follow up in 3-5 business days if no return call.  Thea Silversmith, RN, MSN, [place] [phone]

## 2019-04-15 ENCOUNTER — Other Ambulatory Visit: Payer: Self-pay

## 2019-04-15 NOTE — Patient Outreach (Signed)
  [facility]) Care Management Chronic Special Needs Program    04/15/2019  Name: [name], DOB: 04-Nov-1945  MRN: [id number]   [name] is enrolled in a chronic special needs plan for Heart Failure. Client is active with Hospice. RNCM called client's hospice nurse, Wilma Flavin for follow up. No answer. HIPAA compliant message left.  Plan: await return call. continue to follow.  Thea Silversmith, RN, MSN, [place] [phone]

## 2019-04-15 NOTE — Patient Outreach (Addendum)
[facility]) Care Management Chronic Special Needs Program  04/15/2019  Name: [name] DOB: 1946/03/25  MRN: [id number]  [name] is enrolled in a chronic special needs plan. Client is active with Hospice. RNCM received return call from [place], Wilma Flavin. Care plan reviewed and updated.  [name] reports, client's weight is down 11 pounds since January. [name] states client is not weighing self daily. She adds, client's edema has decreased in lower extremity, although client does have Ascities. She states client has improved symptomatolgy related to her neuropathic pain with the addition of Cymbalta. Client lives on the second floor is unable to ambulate up and down the steps to get out. Client has prn hydroxyzine for itching. She reports client has had two falls, and states, client states she is not going to try to ambulate without her assistive device.  She reports client does check her blood sugars, but has not obtained an A1C-continuing to monitor. She is on 5L/[place] continuous. [name] reports client has an out of facility DNR.   Goals Addressed            This Visit's Progress   . COMPLETED: Client will increase activity tolerance within the next year.       Closed-Active with [facility].    . COMPLETED: Client will verbalize knowledge of diabetes self-management as evidenced by Hgb A1C <7 or as defined by provider.       Closed-Active with [facility].  Diabetes self management actions:  Glucose monitoring per provider recommendations  Perform Quality checks on blood meter  Eat Healthy  Check feet daily  Visit provider every 3-6 months as directed  Hbg A1C level every 3-6 months.  Eye Exam yearly    . COMPLETED: Client will verbalize knowledge of Heart Failure disease self management skills with 9 months.       Closed active with [facility].    . Client/Caregiver will verbalize understanding of instructions related to  self-care and safety   On track    Continue to use your walker as recommended. Continue to be careful when walking around the oxygen tubing. Keep your walkways clear of anything you can trip on and limit throw rugs that can be a tripping hazard.    . COMPLETED: HEMOGLOBIN A1C < 7       Closed active with [facility].    . COMPLETED: Maintain timely refills of diabetic medication as prescribed within the year .   On track    Closed active with [facility].    . COMPLETED: Obtain annual  Lipid Profile, LDL-C       Closed active with [facility].    . COMPLETED: Obtain Annual Eye (retinal)  Exam        Closed active with [facility].    . COMPLETED: Obtain Annual Foot Exam       Closed active with [facility].    . COMPLETED: Obtain annual screen for micro albuminuria (urine) , nephropathy (kidney problems)       Closed active with [facility].    . COMPLETED: Obtain Hemoglobin A1C at least 2 times per year       Closed active with [facility].    Marland Kitchen Physical and Spiritual Needs Met   On track   . Symptom Management of disease processes.   On track   . COMPLETED: Visit Primary Care Provider or Endocrinologist at least 2 times per year        Closed active with [facility].  Plan: RNCM will outreach to Hospice nurse within the next 3 months for follow up.  Thea Silversmith, RN, MSN, [place] [phone]  Addendum: Care Plan updated .

## 2019-04-21 ENCOUNTER — Ambulatory Visit: Payer: Self-pay

## 2019-06-17 ENCOUNTER — Ambulatory Visit (INDEPENDENT_AMBULATORY_CARE_PROVIDER_SITE_OTHER): Payer: Medicare Other | Admitting: *Deleted

## 2019-06-17 DIAGNOSIS — I495 Sick sinus syndrome: Secondary | ICD-10-CM

## 2019-06-17 DIAGNOSIS — I5032 Chronic diastolic (congestive) heart failure: Secondary | ICD-10-CM

## 2019-06-17 LAB — CUP PACEART REMOTE DEVICE CHECK
Battery Remaining Longevity: 97 mo
Battery Remaining Percentage: 95.5 %
Battery Voltage: 2.96 V
Brady Statistic AP VP Percent: 25 %
Brady Statistic AP VS Percent: 75 %
Brady Statistic AS VP Percent: 1 %
Brady Statistic AS VS Percent: 1 %
Brady Statistic RA Percent Paced: 68 %
Brady Statistic RV Percent Paced: 42 %
Date Time Interrogation Session: 20201012090733
Implantable Lead Implant Date: 20151015
Implantable Lead Implant Date: 20151015
Implantable Lead Location: 753859
Implantable Lead Location: 753860
Implantable Lead Model: 1948
Implantable Pulse Generator Implant Date: 20151015
Lead Channel Impedance Value: 440 Ohm
Lead Channel Impedance Value: 600 Ohm
Lead Channel Pacing Threshold Amplitude: 0.75 V
Lead Channel Pacing Threshold Amplitude: 1.25 V
Lead Channel Pacing Threshold Pulse Width: 0.5 ms
Lead Channel Pacing Threshold Pulse Width: 0.5 ms
Lead Channel Sensing Intrinsic Amplitude: 1.8 mV
Lead Channel Sensing Intrinsic Amplitude: 12 mV
Lead Channel Setting Pacing Amplitude: 2 V
Lead Channel Setting Pacing Amplitude: 2.5 V
Lead Channel Setting Pacing Pulse Width: 0.5 ms
Lead Channel Setting Sensing Sensitivity: 2 mV
Pulse Gen Model: 2240
Pulse Gen Serial Number: 7665001

## 2019-06-26 NOTE — Progress Notes (Signed)
Remote pacemaker transmission.   

## 2019-07-18 ENCOUNTER — Other Ambulatory Visit: Payer: Self-pay

## 2019-07-18 NOTE — Patient Outreach (Signed)
  [facility]) Care Management Chronic Special Needs Program  07/18/2019  Name: STEPHENY CANAL DOB: 1946/08/06  MRN: [id number]  Ms. Paislea Hatton is enrolled in a chronic special needs plan. Client is active with [facility]. RNCM called and spoke with client's Hospice nurse, Wilma Flavin for update. [name] reports she just saw client this morning. She reports no significant changes. Client continues to report itching and has medication to help relieve the itching. She reports intermittent nausea and takes compazine for the nausea. [name] states client took compazine last night and helped her to sleep. Per [name] client has had no falls and continues to be ambulatory with cane and uses bed side commode at night to reduce safety risk.    Goals Addressed            This Visit's Progress   . Client/Caregiver will verbalize understanding of instructions related to self-care and safety   On track    Continue to use your assistive device as recommended. Continue to be careful when walking around the oxygen tubing. Keep your walkways clear of anything you can trip on and limit throw rugs that can be a tripping hazard.    . Physical and Spiritual Needs Met   On track   . Symptom Management of disease processes.   On track      Plan: [facility] will continue to follow. RNCM will follow up with [facility] within the next 3-6 months.     Thea Silversmith, RN, MSN, [place] [phone]   .

## 2019-08-06 ENCOUNTER — Telehealth: Payer: Self-pay

## 2019-08-06 NOTE — Telephone Encounter (Signed)
[place] with hospice.  Needs signed order for Patient's amiodarone, prescribed by afib clinic.  Will check to see if order is received.  Will call back if I need her to resend order.  Lockie Mola [phone]

## 2019-08-08 ENCOUNTER — Ambulatory Visit: Payer: Self-pay

## 2019-08-13 NOTE — Telephone Encounter (Signed)
Left message informing Lockie Mola that Sonia Baller is not in the office today, but she should hear from her tomorrow

## 2019-08-13 NOTE — Telephone Encounter (Signed)
Follow up  [place] from [facility] is calling in to get orders that were refaxed on 08/12/19 faxed back to her. Please assist.   Call back # [phone]

## 2019-08-14 NOTE — Telephone Encounter (Signed)
Orders faxed to [facility] as requested.

## 2019-08-27 DIAGNOSIS — R404 Transient alteration of awareness: Secondary | ICD-10-CM | POA: Diagnosis not present

## 2019-09-05 DIAGNOSIS — 419620001 Death: Secondary | SNOMED CT | POA: Diagnosis not present

## 2019-09-05 DEATH — deceased

## 2019-10-08 ENCOUNTER — Other Ambulatory Visit: Payer: Self-pay

## 2019-10-08 NOTE — Patient Outreach (Addendum)
  [facility]) Care Management Chronic Special Needs Program    10/08/2019  Name: [name], DOB: 1946/06/28  MRN: [id number]   Ms. [name] is enrolled in a chronic special needs plan for Diabetes.  Received notification from case management assistant that per [employer] system--- client is deceased.   RNCM called and confirmed with primary care provider office they are aware that client is deceased.   Plan: Case closed.  Thea Silversmith, RN, MSN, [place] [phone]

## 2019-10-10 ENCOUNTER — Ambulatory Visit: Payer: Self-pay

## 2021-11-10 ENCOUNTER — Encounter: Payer: Self-pay | Admitting: Gastroenterology
